# Patient Record
Sex: Female | Born: 1935
Health system: Southern US, Community
[De-identification: ages and names within clinical notes are randomized; demographics above are authoritative.]

## PROBLEM LIST (undated history)

## (undated) ENCOUNTER — Emergency Department (HOSPITAL_COMMUNITY)

## (undated) DIAGNOSIS — N179 Acute kidney failure, unspecified: Secondary | ICD-10-CM

## (undated) DIAGNOSIS — M879 Osteonecrosis, unspecified: Secondary | ICD-10-CM

## (undated) DIAGNOSIS — I739 Peripheral vascular disease, unspecified: Secondary | ICD-10-CM

## (undated) DIAGNOSIS — Z973 Presence of spectacles and contact lenses: Secondary | ICD-10-CM

## (undated) DIAGNOSIS — I712 Thoracic aortic aneurysm, without rupture: Secondary | ICD-10-CM

## (undated) DIAGNOSIS — R519 Headache, unspecified: Secondary | ICD-10-CM

## (undated) DIAGNOSIS — S72009A Fracture of unspecified part of neck of unspecified femur, initial encounter for closed fracture: Secondary | ICD-10-CM

## (undated) DIAGNOSIS — D72829 Elevated white blood cell count, unspecified: Secondary | ICD-10-CM

## (undated) DIAGNOSIS — F32A Depression, unspecified: Secondary | ICD-10-CM

## (undated) DIAGNOSIS — I350 Nonrheumatic aortic (valve) stenosis: Secondary | ICD-10-CM

## (undated) DIAGNOSIS — L97909 Non-pressure chronic ulcer of unspecified part of unspecified lower leg with unspecified severity: Secondary | ICD-10-CM

## (undated) DIAGNOSIS — I7121 Aneurysm of the ascending aorta, without rupture: Secondary | ICD-10-CM

## (undated) DIAGNOSIS — I10 Essential (primary) hypertension: Secondary | ICD-10-CM

## (undated) DIAGNOSIS — F329 Major depressive disorder, single episode, unspecified: Secondary | ICD-10-CM

## (undated) DIAGNOSIS — M069 Rheumatoid arthritis, unspecified: Secondary | ICD-10-CM

## (undated) DIAGNOSIS — S42309A Unspecified fracture of shaft of humerus, unspecified arm, initial encounter for closed fracture: Secondary | ICD-10-CM

## (undated) DIAGNOSIS — D649 Anemia, unspecified: Secondary | ICD-10-CM

## (undated) DIAGNOSIS — M810 Age-related osteoporosis without current pathological fracture: Secondary | ICD-10-CM

## (undated) DIAGNOSIS — H269 Unspecified cataract: Secondary | ICD-10-CM

## (undated) DIAGNOSIS — T148XXA Other injury of unspecified body region, initial encounter: Secondary | ICD-10-CM

## (undated) HISTORY — DX: Elevated white blood cell count, unspecified: D72.829

## (undated) HISTORY — DX: Acute kidney failure, unspecified: N17.9

## (undated) HISTORY — DX: Non-pressure chronic ulcer of unspecified part of unspecified lower leg with unspecified severity: L97.909

## (undated) HISTORY — DX: Nonrheumatic aortic (valve) stenosis: I35.0

## (undated) HISTORY — DX: Fracture of unspecified part of neck of unspecified femur, initial encounter for closed fracture: S72.009A

## (undated) HISTORY — DX: Unspecified fracture of shaft of humerus, unspecified arm, initial encounter for closed fracture: S42.309A

## (undated) HISTORY — DX: Anemia, unspecified: D64.9

---
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
## 2010-01-23 ENCOUNTER — Ambulatory Visit (HOSPITAL_COMMUNITY): Admission: RE | Admit: 2010-01-23 | Discharge: 2010-01-23 | Payer: Self-pay | Admitting: Internal Medicine

## 2010-04-08 ENCOUNTER — Ambulatory Visit (HOSPITAL_COMMUNITY): Admission: RE | Admit: 2010-04-08 | Discharge: 2010-04-08 | Payer: Self-pay | Admitting: Internal Medicine

## 2010-04-14 ENCOUNTER — Ambulatory Visit (HOSPITAL_COMMUNITY): Admission: RE | Admit: 2010-04-14 | Discharge: 2010-04-14 | Payer: Self-pay | Admitting: Internal Medicine

## 2010-05-18 ENCOUNTER — Ambulatory Visit (HOSPITAL_COMMUNITY): Payer: Self-pay | Admitting: Internal Medicine

## 2010-05-18 ENCOUNTER — Encounter (HOSPITAL_COMMUNITY)
Admission: RE | Admit: 2010-05-18 | Discharge: 2010-06-17 | Payer: Self-pay | Source: Home / Self Care | Attending: Internal Medicine | Admitting: Internal Medicine

## 2010-07-12 ENCOUNTER — Encounter: Payer: Self-pay | Admitting: Internal Medicine

## 2010-08-06 ENCOUNTER — Ambulatory Visit (HOSPITAL_COMMUNITY)
Admission: RE | Admit: 2010-08-06 | Discharge: 2010-08-06 | Disposition: A | Discharge status: Home / Self Care | Payer: Medicare Other | Source: Ambulatory Visit | Attending: Rheumatology | Admitting: Rheumatology

## 2010-08-06 ENCOUNTER — Other Ambulatory Visit (HOSPITAL_COMMUNITY): Payer: Self-pay | Admitting: Rheumatology

## 2010-08-06 DIAGNOSIS — R1030 Lower abdominal pain, unspecified: Secondary | ICD-10-CM

## 2010-08-06 DIAGNOSIS — M25559 Pain in unspecified hip: Secondary | ICD-10-CM

## 2010-08-06 DIAGNOSIS — M25569 Pain in unspecified knee: Secondary | ICD-10-CM | POA: Insufficient documentation

## 2010-11-09 ENCOUNTER — Encounter (HOSPITAL_COMMUNITY): Payer: Medicare Other | Attending: Internal Medicine

## 2010-11-09 ENCOUNTER — Encounter (HOSPITAL_COMMUNITY): Payer: Medicare Other

## 2011-06-29 DIAGNOSIS — Z79899 Other long term (current) drug therapy: Secondary | ICD-10-CM | POA: Diagnosis not present

## 2011-06-29 DIAGNOSIS — M069 Rheumatoid arthritis, unspecified: Secondary | ICD-10-CM | POA: Diagnosis not present

## 2011-07-29 DIAGNOSIS — Z79899 Other long term (current) drug therapy: Secondary | ICD-10-CM | POA: Diagnosis not present

## 2011-07-29 DIAGNOSIS — R05 Cough: Secondary | ICD-10-CM | POA: Diagnosis not present

## 2011-07-29 DIAGNOSIS — M069 Rheumatoid arthritis, unspecified: Secondary | ICD-10-CM | POA: Diagnosis not present

## 2011-07-29 DIAGNOSIS — R700 Elevated erythrocyte sedimentation rate: Secondary | ICD-10-CM | POA: Diagnosis not present

## 2011-08-30 DIAGNOSIS — L259 Unspecified contact dermatitis, unspecified cause: Secondary | ICD-10-CM | POA: Diagnosis not present

## 2011-09-07 DIAGNOSIS — R5381 Other malaise: Secondary | ICD-10-CM | POA: Diagnosis not present

## 2011-09-13 DIAGNOSIS — R05 Cough: Secondary | ICD-10-CM | POA: Diagnosis not present

## 2011-09-13 DIAGNOSIS — Z79899 Other long term (current) drug therapy: Secondary | ICD-10-CM | POA: Diagnosis not present

## 2011-09-13 DIAGNOSIS — M069 Rheumatoid arthritis, unspecified: Secondary | ICD-10-CM | POA: Diagnosis not present

## 2011-09-28 DIAGNOSIS — Z79899 Other long term (current) drug therapy: Secondary | ICD-10-CM | POA: Diagnosis not present

## 2011-09-28 DIAGNOSIS — R05 Cough: Secondary | ICD-10-CM | POA: Diagnosis not present

## 2011-09-28 DIAGNOSIS — M069 Rheumatoid arthritis, unspecified: Secondary | ICD-10-CM | POA: Diagnosis not present

## 2011-09-28 DIAGNOSIS — G4700 Insomnia, unspecified: Secondary | ICD-10-CM | POA: Diagnosis not present

## 2011-10-27 DIAGNOSIS — M25549 Pain in joints of unspecified hand: Secondary | ICD-10-CM | POA: Diagnosis not present

## 2011-10-27 DIAGNOSIS — M069 Rheumatoid arthritis, unspecified: Secondary | ICD-10-CM | POA: Diagnosis not present

## 2011-10-27 DIAGNOSIS — M545 Low back pain: Secondary | ICD-10-CM | POA: Diagnosis not present

## 2011-10-27 DIAGNOSIS — Z79899 Other long term (current) drug therapy: Secondary | ICD-10-CM | POA: Diagnosis not present

## 2011-11-09 DIAGNOSIS — Z79899 Other long term (current) drug therapy: Secondary | ICD-10-CM | POA: Diagnosis not present

## 2011-11-09 DIAGNOSIS — M069 Rheumatoid arthritis, unspecified: Secondary | ICD-10-CM | POA: Diagnosis not present

## 2011-12-14 DIAGNOSIS — R5381 Other malaise: Secondary | ICD-10-CM | POA: Diagnosis not present

## 2011-12-14 DIAGNOSIS — H1010 Acute atopic conjunctivitis, unspecified eye: Secondary | ICD-10-CM | POA: Diagnosis not present

## 2011-12-14 DIAGNOSIS — R51 Headache: Secondary | ICD-10-CM | POA: Diagnosis not present

## 2011-12-14 DIAGNOSIS — M316 Other giant cell arteritis: Secondary | ICD-10-CM | POA: Diagnosis not present

## 2011-12-17 DIAGNOSIS — R112 Nausea with vomiting, unspecified: Secondary | ICD-10-CM | POA: Diagnosis not present

## 2011-12-17 DIAGNOSIS — B029 Zoster without complications: Secondary | ICD-10-CM | POA: Diagnosis not present

## 2011-12-24 DIAGNOSIS — H538 Other visual disturbances: Secondary | ICD-10-CM | POA: Diagnosis not present

## 2011-12-24 DIAGNOSIS — B029 Zoster without complications: Secondary | ICD-10-CM | POA: Diagnosis not present

## 2011-12-24 DIAGNOSIS — R112 Nausea with vomiting, unspecified: Secondary | ICD-10-CM | POA: Diagnosis not present

## 2011-12-24 DIAGNOSIS — B0230 Zoster ocular disease, unspecified: Secondary | ICD-10-CM | POA: Diagnosis not present

## 2011-12-27 DIAGNOSIS — B0230 Zoster ocular disease, unspecified: Secondary | ICD-10-CM | POA: Diagnosis not present

## 2012-01-03 DIAGNOSIS — B0230 Zoster ocular disease, unspecified: Secondary | ICD-10-CM | POA: Diagnosis not present

## 2012-01-14 DIAGNOSIS — B029 Zoster without complications: Secondary | ICD-10-CM | POA: Diagnosis not present

## 2012-01-14 DIAGNOSIS — IMO0002 Reserved for concepts with insufficient information to code with codable children: Secondary | ICD-10-CM | POA: Diagnosis not present

## 2012-01-14 DIAGNOSIS — H538 Other visual disturbances: Secondary | ICD-10-CM | POA: Diagnosis not present

## 2012-01-25 DIAGNOSIS — B0230 Zoster ocular disease, unspecified: Secondary | ICD-10-CM | POA: Diagnosis not present

## 2012-02-01 DIAGNOSIS — B0230 Zoster ocular disease, unspecified: Secondary | ICD-10-CM | POA: Diagnosis not present

## 2012-02-14 DIAGNOSIS — IMO0002 Reserved for concepts with insufficient information to code with codable children: Secondary | ICD-10-CM | POA: Diagnosis not present

## 2012-02-14 DIAGNOSIS — M1990 Unspecified osteoarthritis, unspecified site: Secondary | ICD-10-CM | POA: Diagnosis not present

## 2012-02-15 ENCOUNTER — Other Ambulatory Visit (HOSPITAL_COMMUNITY): Payer: Self-pay | Admitting: Internal Medicine

## 2012-02-15 DIAGNOSIS — Z139 Encounter for screening, unspecified: Secondary | ICD-10-CM

## 2012-02-22 ENCOUNTER — Ambulatory Visit (HOSPITAL_COMMUNITY)
Admission: RE | Admit: 2012-02-22 | Discharge: 2012-02-22 | Disposition: A | Discharge status: Home / Self Care | Payer: Medicare Other | Source: Ambulatory Visit | Attending: Internal Medicine | Admitting: Internal Medicine

## 2012-02-22 DIAGNOSIS — Z1231 Encounter for screening mammogram for malignant neoplasm of breast: Secondary | ICD-10-CM | POA: Insufficient documentation

## 2012-02-22 DIAGNOSIS — Z139 Encounter for screening, unspecified: Secondary | ICD-10-CM

## 2012-02-24 DIAGNOSIS — M069 Rheumatoid arthritis, unspecified: Secondary | ICD-10-CM | POA: Diagnosis not present

## 2012-02-24 DIAGNOSIS — Z79899 Other long term (current) drug therapy: Secondary | ICD-10-CM | POA: Diagnosis not present

## 2012-03-20 DIAGNOSIS — H524 Presbyopia: Secondary | ICD-10-CM | POA: Diagnosis not present

## 2012-03-20 DIAGNOSIS — H40019 Open angle with borderline findings, low risk, unspecified eye: Secondary | ICD-10-CM | POA: Diagnosis not present

## 2012-03-20 DIAGNOSIS — H52229 Regular astigmatism, unspecified eye: Secondary | ICD-10-CM | POA: Diagnosis not present

## 2012-03-20 DIAGNOSIS — H5200 Hypermetropia, unspecified eye: Secondary | ICD-10-CM | POA: Diagnosis not present

## 2012-04-04 DIAGNOSIS — H4040X0 Glaucoma secondary to eye inflammation, unspecified eye, stage unspecified: Secondary | ICD-10-CM | POA: Diagnosis not present

## 2012-04-04 DIAGNOSIS — H409 Unspecified glaucoma: Secondary | ICD-10-CM | POA: Diagnosis not present

## 2012-04-04 DIAGNOSIS — B0230 Zoster ocular disease, unspecified: Secondary | ICD-10-CM | POA: Diagnosis not present

## 2012-04-25 DIAGNOSIS — H4040X0 Glaucoma secondary to eye inflammation, unspecified eye, stage unspecified: Secondary | ICD-10-CM | POA: Diagnosis not present

## 2012-04-25 DIAGNOSIS — B0230 Zoster ocular disease, unspecified: Secondary | ICD-10-CM | POA: Diagnosis not present

## 2012-04-25 DIAGNOSIS — H409 Unspecified glaucoma: Secondary | ICD-10-CM | POA: Diagnosis not present

## 2012-05-09 DIAGNOSIS — H4040X0 Glaucoma secondary to eye inflammation, unspecified eye, stage unspecified: Secondary | ICD-10-CM | POA: Diagnosis not present

## 2012-05-09 DIAGNOSIS — B0230 Zoster ocular disease, unspecified: Secondary | ICD-10-CM | POA: Diagnosis not present

## 2012-07-11 DIAGNOSIS — B0230 Zoster ocular disease, unspecified: Secondary | ICD-10-CM | POA: Diagnosis not present

## 2012-08-14 DIAGNOSIS — Z79899 Other long term (current) drug therapy: Secondary | ICD-10-CM | POA: Diagnosis not present

## 2012-08-14 DIAGNOSIS — M2550 Pain in unspecified joint: Secondary | ICD-10-CM | POA: Diagnosis not present

## 2012-08-14 DIAGNOSIS — E785 Hyperlipidemia, unspecified: Secondary | ICD-10-CM | POA: Diagnosis not present

## 2012-08-14 DIAGNOSIS — I10 Essential (primary) hypertension: Secondary | ICD-10-CM | POA: Diagnosis not present

## 2012-11-08 DIAGNOSIS — M2550 Pain in unspecified joint: Secondary | ICD-10-CM | POA: Diagnosis not present

## 2012-11-08 DIAGNOSIS — T7840XA Allergy, unspecified, initial encounter: Secondary | ICD-10-CM | POA: Diagnosis not present

## 2012-11-28 DIAGNOSIS — L259 Unspecified contact dermatitis, unspecified cause: Secondary | ICD-10-CM | POA: Diagnosis not present

## 2012-12-05 DIAGNOSIS — L259 Unspecified contact dermatitis, unspecified cause: Secondary | ICD-10-CM | POA: Diagnosis not present

## 2012-12-19 DIAGNOSIS — L259 Unspecified contact dermatitis, unspecified cause: Secondary | ICD-10-CM | POA: Diagnosis not present

## 2013-03-06 DIAGNOSIS — L259 Unspecified contact dermatitis, unspecified cause: Secondary | ICD-10-CM | POA: Diagnosis not present

## 2013-03-13 DIAGNOSIS — H524 Presbyopia: Secondary | ICD-10-CM | POA: Diagnosis not present

## 2013-03-13 DIAGNOSIS — H40059 Ocular hypertension, unspecified eye: Secondary | ICD-10-CM | POA: Diagnosis not present

## 2013-03-13 DIAGNOSIS — H52229 Regular astigmatism, unspecified eye: Secondary | ICD-10-CM | POA: Diagnosis not present

## 2013-03-13 DIAGNOSIS — H5200 Hypermetropia, unspecified eye: Secondary | ICD-10-CM | POA: Diagnosis not present

## 2013-04-30 DIAGNOSIS — F3289 Other specified depressive episodes: Secondary | ICD-10-CM | POA: Diagnosis not present

## 2013-04-30 DIAGNOSIS — F329 Major depressive disorder, single episode, unspecified: Secondary | ICD-10-CM | POA: Diagnosis not present

## 2013-04-30 DIAGNOSIS — M069 Rheumatoid arthritis, unspecified: Secondary | ICD-10-CM | POA: Diagnosis not present

## 2013-05-22 DIAGNOSIS — M25559 Pain in unspecified hip: Secondary | ICD-10-CM | POA: Diagnosis not present

## 2013-05-22 DIAGNOSIS — F329 Major depressive disorder, single episode, unspecified: Secondary | ICD-10-CM | POA: Diagnosis not present

## 2013-06-26 DIAGNOSIS — L2089 Other atopic dermatitis: Secondary | ICD-10-CM | POA: Diagnosis not present

## 2013-06-26 DIAGNOSIS — M25559 Pain in unspecified hip: Secondary | ICD-10-CM | POA: Diagnosis not present

## 2014-04-23 DIAGNOSIS — R05 Cough: Secondary | ICD-10-CM | POA: Diagnosis not present

## 2014-07-10 DIAGNOSIS — L239 Allergic contact dermatitis, unspecified cause: Secondary | ICD-10-CM | POA: Diagnosis not present

## 2014-08-01 DIAGNOSIS — M25569 Pain in unspecified knee: Secondary | ICD-10-CM | POA: Diagnosis not present

## 2014-08-01 DIAGNOSIS — L239 Allergic contact dermatitis, unspecified cause: Secondary | ICD-10-CM | POA: Diagnosis not present

## 2015-03-14 ENCOUNTER — Encounter (HOSPITAL_COMMUNITY)
Admission: EM | Disposition: A | Discharge status: Skilled Nursing Facility | Payer: Self-pay | Source: Home / Self Care | Attending: Internal Medicine

## 2015-03-14 ENCOUNTER — Inpatient Hospital Stay (HOSPITAL_COMMUNITY): Payer: Medicare Other

## 2015-03-14 ENCOUNTER — Emergency Department (HOSPITAL_COMMUNITY): Payer: Medicare Other

## 2015-03-14 ENCOUNTER — Inpatient Hospital Stay (HOSPITAL_COMMUNITY): Payer: Medicare Other | Admitting: Certified Registered"

## 2015-03-14 ENCOUNTER — Encounter (HOSPITAL_COMMUNITY): Payer: Self-pay

## 2015-03-14 ENCOUNTER — Inpatient Hospital Stay (HOSPITAL_COMMUNITY)
Admission: EM | Admit: 2015-03-14 | Discharge: 2015-03-17 | DRG: 481 | Disposition: A | Discharge status: Skilled Nursing Facility | Payer: Medicare Other | Attending: Internal Medicine | Admitting: Internal Medicine

## 2015-03-14 DIAGNOSIS — W19XXXS Unspecified fall, sequela: Secondary | ICD-10-CM | POA: Diagnosis not present

## 2015-03-14 DIAGNOSIS — S72001A Fracture of unspecified part of neck of right femur, initial encounter for closed fracture: Secondary | ICD-10-CM | POA: Diagnosis not present

## 2015-03-14 DIAGNOSIS — S72131A Displaced apophyseal fracture of right femur, initial encounter for closed fracture: Secondary | ICD-10-CM | POA: Diagnosis not present

## 2015-03-14 DIAGNOSIS — W19XXXA Unspecified fall, initial encounter: Secondary | ICD-10-CM | POA: Diagnosis present

## 2015-03-14 DIAGNOSIS — D62 Acute posthemorrhagic anemia: Secondary | ICD-10-CM | POA: Diagnosis not present

## 2015-03-14 DIAGNOSIS — Z9181 History of falling: Secondary | ICD-10-CM | POA: Diagnosis not present

## 2015-03-14 DIAGNOSIS — M6281 Muscle weakness (generalized): Secondary | ICD-10-CM | POA: Diagnosis not present

## 2015-03-14 DIAGNOSIS — M810 Age-related osteoporosis without current pathological fracture: Secondary | ICD-10-CM | POA: Diagnosis present

## 2015-03-14 DIAGNOSIS — N179 Acute kidney failure, unspecified: Secondary | ICD-10-CM | POA: Diagnosis not present

## 2015-03-14 DIAGNOSIS — S72141D Displaced intertrochanteric fracture of right femur, subsequent encounter for closed fracture with routine healing: Secondary | ICD-10-CM | POA: Diagnosis not present

## 2015-03-14 DIAGNOSIS — M25551 Pain in right hip: Secondary | ICD-10-CM | POA: Diagnosis not present

## 2015-03-14 DIAGNOSIS — S72141A Displaced intertrochanteric fracture of right femur, initial encounter for closed fracture: Secondary | ICD-10-CM | POA: Diagnosis present

## 2015-03-14 DIAGNOSIS — M069 Rheumatoid arthritis, unspecified: Secondary | ICD-10-CM | POA: Diagnosis not present

## 2015-03-14 DIAGNOSIS — M21251 Flexion deformity, right hip: Secondary | ICD-10-CM | POA: Diagnosis not present

## 2015-03-14 DIAGNOSIS — T148XXA Other injury of unspecified body region, initial encounter: Secondary | ICD-10-CM

## 2015-03-14 DIAGNOSIS — R110 Nausea: Secondary | ICD-10-CM | POA: Diagnosis not present

## 2015-03-14 DIAGNOSIS — R262 Difficulty in walking, not elsewhere classified: Secondary | ICD-10-CM | POA: Diagnosis not present

## 2015-03-14 DIAGNOSIS — S72001S Fracture of unspecified part of neck of right femur, sequela: Secondary | ICD-10-CM | POA: Diagnosis not present

## 2015-03-14 DIAGNOSIS — R030 Elevated blood-pressure reading, without diagnosis of hypertension: Secondary | ICD-10-CM | POA: Diagnosis not present

## 2015-03-14 DIAGNOSIS — Z9981 Dependence on supplemental oxygen: Secondary | ICD-10-CM | POA: Diagnosis not present

## 2015-03-14 DIAGNOSIS — R278 Other lack of coordination: Secondary | ICD-10-CM | POA: Diagnosis not present

## 2015-03-14 DIAGNOSIS — Z4789 Encounter for other orthopedic aftercare: Secondary | ICD-10-CM | POA: Diagnosis not present

## 2015-03-14 HISTORY — DX: Age-related osteoporosis without current pathological fracture: M81.0

## 2015-03-14 HISTORY — DX: Rheumatoid arthritis, unspecified: M06.9

## 2015-03-14 HISTORY — DX: Fracture of unspecified part of neck of right femur, initial encounter for closed fracture: S72.001A

## 2015-03-14 HISTORY — PX: INTRAMEDULLARY (IM) NAIL INTERTROCHANTERIC: SHX5875

## 2015-03-14 LAB — COMPREHENSIVE METABOLIC PANEL
ALBUMIN: 4.4 g/dL (ref 3.5–5.0)
ALK PHOS: 70 U/L (ref 38–126)
ALT: 13 U/L — AB (ref 14–54)
AST: 27 U/L (ref 15–41)
Anion gap: 12 (ref 5–15)
BILIRUBIN TOTAL: 0.6 mg/dL (ref 0.3–1.2)
BUN: 28 mg/dL — AB (ref 6–20)
CO2: 22 mmol/L (ref 22–32)
Calcium: 9.6 mg/dL (ref 8.9–10.3)
Chloride: 105 mmol/L (ref 101–111)
Creatinine, Ser: 1.08 mg/dL — ABNORMAL HIGH (ref 0.44–1.00)
GFR calc Af Amer: 54 mL/min — ABNORMAL LOW (ref >60)
GFR calc non Af Amer: 47 mL/min — ABNORMAL LOW (ref >60)
GLUCOSE: 103 mg/dL — AB (ref 65–99)
POTASSIUM: 3.7 mmol/L (ref 3.5–5.1)
Sodium: 139 mmol/L (ref 135–145)
TOTAL PROTEIN: 7.1 g/dL (ref 6.5–8.1)

## 2015-03-14 LAB — CBC
HEMATOCRIT: 37 % (ref 36.0–46.0)
Hemoglobin: 12.1 g/dL (ref 12.0–15.0)
MCH: 30 pg (ref 26.0–34.0)
MCHC: 32.7 g/dL (ref 30.0–36.0)
MCV: 91.6 fL (ref 78.0–100.0)
Platelets: 235 10*3/uL (ref 150–400)
RBC: 4.04 MIL/uL (ref 3.87–5.11)
RDW: 13.5 % (ref 11.5–15.5)
WBC: 11.8 10*3/uL — AB (ref 4.0–10.5)

## 2015-03-14 LAB — TYPE AND SCREEN
ABO/RH(D): O POS
ANTIBODY SCREEN: NEGATIVE

## 2015-03-14 LAB — URINALYSIS, ROUTINE W REFLEX MICROSCOPIC
Bilirubin Urine: NEGATIVE
Glucose, UA: NEGATIVE mg/dL
HGB URINE DIPSTICK: NEGATIVE
Ketones, ur: 15 mg/dL — AB
Leukocytes, UA: NEGATIVE
Nitrite: NEGATIVE
Protein, ur: NEGATIVE mg/dL
SPECIFIC GRAVITY, URINE: 1.01 (ref 1.005–1.030)
UROBILINOGEN UA: 0.2 mg/dL (ref 0.0–1.0)
pH: 7 (ref 5.0–8.0)

## 2015-03-14 LAB — CBC WITH DIFFERENTIAL/PLATELET
BASOS ABS: 0 10*3/uL (ref 0.0–0.1)
BASOS PCT: 0 %
Eosinophils Absolute: 0.1 10*3/uL (ref 0.0–0.7)
Eosinophils Relative: 1 %
HEMATOCRIT: 42.6 % (ref 36.0–46.0)
HEMOGLOBIN: 14.1 g/dL (ref 12.0–15.0)
Lymphocytes Relative: 19 %
Lymphs Abs: 1.9 10*3/uL (ref 0.7–4.0)
MCH: 30.6 pg (ref 26.0–34.0)
MCHC: 33.1 g/dL (ref 30.0–36.0)
MCV: 92.4 fL (ref 78.0–100.0)
Monocytes Absolute: 0.7 10*3/uL (ref 0.1–1.0)
Monocytes Relative: 7 %
NEUTROS ABS: 7.1 10*3/uL (ref 1.7–7.7)
NEUTROS PCT: 73 %
Platelets: 268 10*3/uL (ref 150–400)
RBC: 4.61 MIL/uL (ref 3.87–5.11)
RDW: 13.5 % (ref 11.5–15.5)
WBC: 9.7 10*3/uL (ref 4.0–10.5)

## 2015-03-14 LAB — CREATININE, SERUM
Creatinine, Ser: 1.07 mg/dL — ABNORMAL HIGH (ref 0.44–1.00)
GFR calc non Af Amer: 47 mL/min — ABNORMAL LOW (ref >60)
GFR, EST AFRICAN AMERICAN: 55 mL/min — AB (ref >60)

## 2015-03-14 LAB — SURGICAL PCR SCREEN
MRSA, PCR: NEGATIVE
STAPHYLOCOCCUS AUREUS: NEGATIVE

## 2015-03-14 LAB — PROTIME-INR
INR: 0.99 (ref 0.00–1.49)
Prothrombin Time: 13.3 seconds (ref 11.6–15.2)

## 2015-03-14 LAB — ABO/RH: ABO/RH(D): O POS

## 2015-03-14 SURGERY — FIXATION, FRACTURE, INTERTROCHANTERIC, WITH INTRAMEDULLARY ROD
Anesthesia: General | Site: Hip | Laterality: Right

## 2015-03-14 MED ORDER — OXYCODONE HCL 5 MG/5ML PO SOLN: 5.0000 mg | Freq: Once | ORAL | Status: DC | PRN

## 2015-03-14 MED ORDER — CEFAZOLIN SODIUM 1-5 GM-% IV SOLN
1.0000 g | Freq: Three times a day (TID) | INTRAVENOUS | Status: AC
  Administered 2015-03-15 (×2): 1 g via INTRAVENOUS
  Filled 2015-03-14 (×2): qty 50

## 2015-03-14 MED ORDER — SUCCINYLCHOLINE CHLORIDE 20 MG/ML IJ SOLN
INTRAMUSCULAR | Status: DC | PRN
  Administered 2015-03-14: 80 mg via INTRAVENOUS

## 2015-03-14 MED ORDER — HYDROCODONE-ACETAMINOPHEN 5-325 MG PO TABS: 1.0000 | ORAL_TABLET | Freq: Four times a day (QID) | ORAL | Status: DC | PRN

## 2015-03-14 MED ORDER — ONDANSETRON HCL 4 MG/2ML IJ SOLN: 4.0000 mg | Freq: Four times a day (QID) | INTRAMUSCULAR | Status: DC | PRN

## 2015-03-14 MED ORDER — POLYETHYLENE GLYCOL 3350 17 G PO PACK
17.0000 g | PACK | Freq: Every day | ORAL | Status: DC | PRN
  Administered 2015-03-16: 17 g via ORAL
  Filled 2015-03-14: qty 1

## 2015-03-14 MED ORDER — HEPARIN SODIUM (PORCINE) 5000 UNIT/ML IJ SOLN
5000.0000 [IU] | Freq: Three times a day (TID) | INTRAMUSCULAR | Status: DC
  Administered 2015-03-14 – 2015-03-17 (×9): 5000 [IU] via SUBCUTANEOUS
  Filled 2015-03-14 (×9): qty 1

## 2015-03-14 MED ORDER — SUCCINYLCHOLINE CHLORIDE 20 MG/ML IJ SOLN
INTRAMUSCULAR | Status: AC
Start: 2015-03-14 — End: 2015-03-14
  Filled 2015-03-14: qty 1

## 2015-03-14 MED ORDER — METHOCARBAMOL 1000 MG/10ML IJ SOLN: 500.0000 mg | Freq: Four times a day (QID) | INTRAVENOUS | Status: DC | PRN

## 2015-03-14 MED ORDER — LACTATED RINGERS IV SOLN
INTRAVENOUS | Status: DC | PRN
  Administered 2015-03-14: 22:00:00 via INTRAVENOUS

## 2015-03-14 MED ORDER — SODIUM CHLORIDE 0.9 % IV BOLUS (SEPSIS)
1000.0000 mL | Freq: Once | INTRAVENOUS | Status: AC
  Administered 2015-03-14: 1000 mL via INTRAVENOUS

## 2015-03-14 MED ORDER — MENTHOL 3 MG MT LOZG: 1.0000 | LOZENGE | OROMUCOSAL | Status: DC | PRN

## 2015-03-14 MED ORDER — SODIUM CHLORIDE 0.9 % IV SOLN
INTRAVENOUS | Status: DC
  Administered 2015-03-14: 23:00:00 via INTRAVENOUS

## 2015-03-14 MED ORDER — ACETAMINOPHEN 325 MG PO TABS: 650.0000 mg | ORAL_TABLET | Freq: Four times a day (QID) | ORAL | Status: DC | PRN

## 2015-03-14 MED ORDER — MORPHINE SULFATE (PF) 2 MG/ML IV SOLN
0.5000 mg | INTRAVENOUS | Status: DC | PRN
  Administered 2015-03-14 – 2015-03-15 (×3): 0.5 mg via INTRAVENOUS
  Filled 2015-03-14 (×3): qty 1

## 2015-03-14 MED ORDER — CEFAZOLIN SODIUM-DEXTROSE 2-3 GM-% IV SOLR
INTRAVENOUS | Status: AC
Start: 2015-03-14 — End: 2015-03-14
  Filled 2015-03-14: qty 50

## 2015-03-14 MED ORDER — 0.9 % SODIUM CHLORIDE (POUR BTL) OPTIME
TOPICAL | Status: DC | PRN
  Administered 2015-03-14: 1000 mL

## 2015-03-14 MED ORDER — DEXAMETHASONE SODIUM PHOSPHATE 4 MG/ML IJ SOLN
INTRAMUSCULAR | Status: DC | PRN
  Administered 2015-03-14: 4 mg via INTRAVENOUS

## 2015-03-14 MED ORDER — FENTANYL CITRATE (PF) 100 MCG/2ML IJ SOLN: 25.0000 ug | INTRAMUSCULAR | Status: DC | PRN

## 2015-03-14 MED ORDER — METOCLOPRAMIDE HCL 5 MG/ML IJ SOLN
5.0000 mg | Freq: Three times a day (TID) | INTRAMUSCULAR | Status: DC | PRN
  Administered 2015-03-15 – 2015-03-17 (×3): 10 mg via INTRAVENOUS
  Filled 2015-03-14 (×3): qty 2

## 2015-03-14 MED ORDER — EPHEDRINE SULFATE 50 MG/ML IJ SOLN
INTRAMUSCULAR | Status: DC | PRN
  Administered 2015-03-14 (×3): 10 mg via INTRAVENOUS

## 2015-03-14 MED ORDER — ASPIRIN EC 325 MG PO TBEC
325.0000 mg | DELAYED_RELEASE_TABLET | Freq: Every day | ORAL | Status: DC
  Administered 2015-03-15 – 2015-03-17 (×3): 325 mg via ORAL
  Filled 2015-03-14 (×3): qty 1

## 2015-03-14 MED ORDER — SERTRALINE HCL 50 MG PO TABS
50.0000 mg | ORAL_TABLET | Freq: Every day | ORAL | Status: DC
  Administered 2015-03-15 – 2015-03-17 (×3): 50 mg via ORAL
  Filled 2015-03-14 (×3): qty 1

## 2015-03-14 MED ORDER — EPHEDRINE SULFATE 50 MG/ML IJ SOLN
INTRAMUSCULAR | Status: AC
  Filled 2015-03-14: qty 1

## 2015-03-14 MED ORDER — OXYCODONE HCL 5 MG PO TABS: 5.0000 mg | ORAL_TABLET | Freq: Once | ORAL | Status: DC | PRN

## 2015-03-14 MED ORDER — SODIUM CHLORIDE 0.9 % IJ SOLN
INTRAMUSCULAR | Status: AC
  Filled 2015-03-14: qty 10

## 2015-03-14 MED ORDER — MORPHINE SULFATE (PF) 4 MG/ML IV SOLN
4.0000 mg | Freq: Once | INTRAVENOUS | Status: AC
  Administered 2015-03-14: 2 mg via INTRAVENOUS
  Filled 2015-03-14: qty 1

## 2015-03-14 MED ORDER — ONDANSETRON HCL 4 MG PO TABS: 4.0000 mg | ORAL_TABLET | Freq: Four times a day (QID) | ORAL | Status: DC | PRN

## 2015-03-14 MED ORDER — ONDANSETRON HCL 4 MG/2ML IJ SOLN: 4.0000 mg | Freq: Once | INTRAMUSCULAR | Status: DC | PRN

## 2015-03-14 MED ORDER — HYDROCODONE-ACETAMINOPHEN 5-325 MG PO TABS
1.0000 | ORAL_TABLET | Freq: Four times a day (QID) | ORAL | Status: DC | PRN
  Administered 2015-03-15 (×2): 2 via ORAL
  Filled 2015-03-14 (×2): qty 2

## 2015-03-14 MED ORDER — ONDANSETRON HCL 4 MG/2ML IJ SOLN
4.0000 mg | Freq: Once | INTRAMUSCULAR | Status: AC
  Administered 2015-03-14: 4 mg via INTRAVENOUS
  Filled 2015-03-14: qty 2

## 2015-03-14 MED ORDER — ONDANSETRON HCL 4 MG/2ML IJ SOLN
INTRAMUSCULAR | Status: AC
  Filled 2015-03-14: qty 2

## 2015-03-14 MED ORDER — METHOCARBAMOL 500 MG PO TABS
500.0000 mg | ORAL_TABLET | Freq: Four times a day (QID) | ORAL | Status: DC | PRN
  Administered 2015-03-15 – 2015-03-17 (×2): 500 mg via ORAL
  Filled 2015-03-14 (×2): qty 1

## 2015-03-14 MED ORDER — OXYCODONE HCL 5 MG PO TABS: 5.0000 mg | ORAL_TABLET | ORAL | Status: DC | PRN

## 2015-03-14 MED ORDER — DIAZEPAM 5 MG/ML IJ SOLN
2.5000 mg | Freq: Once | INTRAMUSCULAR | Status: AC
Start: 2015-03-14 — End: 2015-03-14
  Administered 2015-03-14: 2.5 mg via INTRAVENOUS
  Filled 2015-03-14: qty 2

## 2015-03-14 MED ORDER — METHOCARBAMOL 1000 MG/10ML IJ SOLN
500.0000 mg | Freq: Four times a day (QID) | INTRAVENOUS | Status: DC | PRN
  Filled 2015-03-14: qty 5

## 2015-03-14 MED ORDER — PROPOFOL 10 MG/ML IV BOLUS
INTRAVENOUS | Status: DC | PRN
  Administered 2015-03-14: 100 mg via INTRAVENOUS

## 2015-03-14 MED ORDER — SODIUM CHLORIDE 0.9 % IV SOLN
INTRAVENOUS | Status: DC | PRN
  Administered 2015-03-14: 21:00:00 via INTRAVENOUS

## 2015-03-14 MED ORDER — ACETAMINOPHEN 650 MG RE SUPP: 650.0000 mg | Freq: Four times a day (QID) | RECTAL | Status: DC | PRN

## 2015-03-14 MED ORDER — METHOCARBAMOL 500 MG PO TABS: 500.0000 mg | ORAL_TABLET | Freq: Four times a day (QID) | ORAL | Status: DC | PRN

## 2015-03-14 MED ORDER — METOCLOPRAMIDE HCL 5 MG PO TABS: 5.0000 mg | ORAL_TABLET | Freq: Three times a day (TID) | ORAL | Status: DC | PRN

## 2015-03-14 MED ORDER — SODIUM CHLORIDE 0.9 % IV SOLN
INTRAVENOUS | Status: DC
  Administered 2015-03-14: 20:00:00 via INTRAVENOUS

## 2015-03-14 MED ORDER — DIAZEPAM 5 MG/ML IJ SOLN
1.0000 mg | Freq: Once | INTRAMUSCULAR | Status: AC
  Administered 2015-03-14: 1 mg via INTRAVENOUS
  Filled 2015-03-14: qty 2

## 2015-03-14 MED ORDER — ONDANSETRON HCL 4 MG/2ML IJ SOLN
INTRAMUSCULAR | Status: DC | PRN
  Administered 2015-03-14: 4 mg via INTRAVENOUS

## 2015-03-14 MED ORDER — SUFENTANIL CITRATE 50 MCG/ML IV SOLN
INTRAVENOUS | Status: AC
  Filled 2015-03-14: qty 1

## 2015-03-14 MED ORDER — MORPHINE SULFATE (PF) 2 MG/ML IV SOLN: 2.0000 mg | INTRAVENOUS | Status: DC | PRN

## 2015-03-14 MED ORDER — SUFENTANIL CITRATE 50 MCG/ML IV SOLN
INTRAVENOUS | Status: DC | PRN
  Administered 2015-03-14 (×2): 10 ug via INTRAVENOUS

## 2015-03-14 MED ORDER — PHENOL 1.4 % MT LIQD: 1.0000 | OROMUCOSAL | Status: DC | PRN

## 2015-03-14 MED ORDER — LIDOCAINE HCL (CARDIAC) 20 MG/ML IV SOLN
INTRAVENOUS | Status: AC
Start: 2015-03-14 — End: 2015-03-14
  Filled 2015-03-14: qty 5

## 2015-03-14 MED ORDER — LIDOCAINE HCL (CARDIAC) 20 MG/ML IV SOLN
INTRAVENOUS | Status: DC | PRN
  Administered 2015-03-14: 100 mg via INTRAVENOUS

## 2015-03-14 MED ORDER — CEFAZOLIN SODIUM-DEXTROSE 2-3 GM-% IV SOLR
INTRAVENOUS | Status: DC | PRN
  Administered 2015-03-14: 2 g via INTRAVENOUS

## 2015-03-14 MED ORDER — DEXAMETHASONE SODIUM PHOSPHATE 4 MG/ML IJ SOLN
INTRAMUSCULAR | Status: AC
  Filled 2015-03-14: qty 1

## 2015-03-14 SURGICAL SUPPLY — 34 items
BNDG GAUZE ELAST 4 BULKY (GAUZE/BANDAGES/DRESSINGS) ×3 IMPLANT
COVER PERINEAL POST (MISCELLANEOUS) ×3 IMPLANT
COVER SURGICAL LIGHT HANDLE (MISCELLANEOUS) ×3 IMPLANT
DRSG MEPILEX BORDER 4X4 (GAUZE/BANDAGES/DRESSINGS) ×6 IMPLANT
DURAPREP 26ML APPLICATOR (WOUND CARE) ×3 IMPLANT
ELECT REM PT RETURN 9FT ADLT (ELECTROSURGICAL) ×3
ELECTRODE REM PT RTRN 9FT ADLT (ELECTROSURGICAL) ×1 IMPLANT
GAUZE XEROFORM 1X8 LF (GAUZE/BANDAGES/DRESSINGS) ×3 IMPLANT
GLOVE BIOGEL PI IND STRL 7.0 (GLOVE) ×2 IMPLANT
GLOVE BIOGEL PI IND STRL 8 (GLOVE) ×1 IMPLANT
GLOVE BIOGEL PI INDICATOR 7.0 (GLOVE) ×4
GLOVE BIOGEL PI INDICATOR 8 (GLOVE) ×2
GLOVE ECLIPSE 6.5 STRL STRAW (GLOVE) ×3 IMPLANT
GLOVE ORTHO TXT STRL SZ7.5 (GLOVE) ×3 IMPLANT
GOWN STRL REUS W/ TWL LRG LVL3 (GOWN DISPOSABLE) ×1 IMPLANT
GOWN STRL REUS W/ TWL XL LVL3 (GOWN DISPOSABLE) ×1 IMPLANT
GOWN STRL REUS W/TWL LRG LVL3 (GOWN DISPOSABLE) ×2
GOWN STRL REUS W/TWL XL LVL3 (GOWN DISPOSABLE) ×2
GUIDEPIN 3.2X17.5 THRD DISP (PIN) ×3 IMPLANT
HFN RH 130 DEG 9MM X 360MM (Nail) ×3 IMPLANT
KIT BASIN OR (CUSTOM PROCEDURE TRAY) ×3 IMPLANT
KIT ROOM TURNOVER OR (KITS) ×3 IMPLANT
NS IRRIG 1000ML POUR BTL (IV SOLUTION) ×3 IMPLANT
PACK GENERAL/GYN (CUSTOM PROCEDURE TRAY) ×3 IMPLANT
PAD ARMBOARD 7.5X6 YLW CONV (MISCELLANEOUS) ×6 IMPLANT
SCREW LAG HIP NAIL 10.5X95 (Screw) ×3 IMPLANT
STAPLER VISISTAT 35W (STAPLE) ×3 IMPLANT
SUT VIC AB 0 CT1 27 (SUTURE) ×2
SUT VIC AB 0 CT1 27XBRD ANBCTR (SUTURE) ×1 IMPLANT
SUT VIC AB 2-0 CT1 27 (SUTURE) ×2
SUT VIC AB 2-0 CT1 TAPERPNT 27 (SUTURE) ×1 IMPLANT
TOWEL OR 17X24 6PK STRL BLUE (TOWEL DISPOSABLE) ×3 IMPLANT
TOWEL OR 17X26 10 PK STRL BLUE (TOWEL DISPOSABLE) ×3 IMPLANT
WATER STERILE IRR 1000ML POUR (IV SOLUTION) ×3 IMPLANT

## 2015-03-14 NOTE — ED Provider Notes (Signed)
CSN: [id number]     Arrival date & time 03/14/15  1259 History   First MD Initiated Contact with Patient 03/14/15 1309     Chief Complaint  Patient presents with  . Fall  . Hip Pain     (Consider location/radiation/quality/duration/timing/severity/associated sxs/prior Treatment) Patient is a 81 y.o. female presenting with hip pain.  Hip Pain This is a new problem. The current episode started less than 1 hour ago. The problem occurs constantly. Pertinent negatives include no chest pain and no shortness of breath. Nothing aggravates the symptoms. Nothing relieves the symptoms. She has tried nothing for the symptoms. The treatment provided no relief.    Past Medical History  Diagnosis Date  . Osteoporosis    History reviewed. No pertinent past surgical history. No family history on file. Social History  Substance Use Topics  . Smoking status: Never Smoker   . Smokeless tobacco: None  . Alcohol Use: No   OB History    No data available     Review of Systems  Constitutional: Negative for fever and chills.  Respiratory: Negative for shortness of breath.   Cardiovascular: Negative for chest pain.  Gastrointestinal: Positive for nausea. Negative for vomiting, diarrhea and constipation.  Musculoskeletal: Positive for gait problem.       Right hip pain  All other systems reviewed and are negative.     Allergies  Penicillins and Codeine  Home Medications   Prior to Admission medications   Medication Sig Start Date End Date Taking? Authorizing Provider  acetaminophen (TYLENOL) 500 MG tablet Take 500 mg by mouth every 6 (six) hours as needed for mild pain.   Yes Historical Provider, MD  Glucosamine-Chondroitin (OSTEO BI-FLEX REGULAR STRENGTH PO) Take 1 tablet by mouth 2 (two) times daily.   Yes Historical Provider, MD  Multiple Vitamins-Minerals (MULTIVITAMIN WITH MINERALS) tablet Take 1 tablet by mouth daily.   Yes Historical Provider, MD  naproxen sodium (ALEVE) 220 MG  tablet Take 220 mg by mouth daily as needed (pain).   Yes Historical Provider, MD  sertraline (ZOLOFT) 50 MG tablet Take 50 mg by mouth daily. 02/18/15  Yes Historical Provider, MD  fluticasone (CUTIVATE) 0.05 % cream Apply 1 application topically daily as needed. 12/07/14   Historical Provider, MD   BP 175/72 mmHg  Pulse 73  Resp 11  Ht 5\' 2"  (1.575 m)  Wt 109 lb (49.442 kg)  BMI 19.93 kg/m2  SpO2 100% Physical Exam  Constitutional: She is oriented to person, place, and time. She appears well-developed and well-nourished.  HENT:  Head: Normocephalic and atraumatic.  Eyes: Conjunctivae and EOM are normal. Right eye exhibits no discharge. Left eye exhibits no discharge.  Cardiovascular: Normal rate and regular rhythm.   Pulmonary/Chest: Effort normal and breath sounds normal. No respiratory distress.  Abdominal: Soft. She exhibits no distension. There is no tenderness. There is no rebound.  Musculoskeletal: Normal range of motion. She exhibits no edema or tenderness.  Neurological: She is alert and oriented to person, place, and time.  Skin: Skin is warm and dry.  Nursing note and vitals reviewed.   ED Course  Procedures (including critical care time) Labs Review Labs Reviewed  COMPREHENSIVE METABOLIC PANEL - Abnormal; Notable for the following:    Glucose, Bld 103 (*)    BUN 28 (*)    Creatinine, Ser 1.08 (*)    ALT 13 (*)    GFR calc non Af Amer 47 (*)    GFR calc Af Amer 54 (*)  All other components within normal limits  URINALYSIS, ROUTINE W REFLEX MICROSCOPIC (NOT AT [facility]) - Abnormal; Notable for the following:    APPearance HAZY (*)    Ketones, ur 15 (*)    All other components within normal limits  CBC WITH DIFFERENTIAL/PLATELET  PROTIME-INR    Imaging Review Dg Pelvis Portable  03/14/2015   CLINICAL DATA:  She was leaning against an elevator door that opened and she fell backwards landing on her rt hip. Severe pain rt hip.  EXAM: PORTABLE PELVIS 1-2 VIEWS   COMPARISON:  08/06/2010  FINDINGS: There is a comminuted intertrochanteric fracture of the right hip, associated with varus angulation. No dislocation. The pelvis and left hip appear intact.  IMPRESSION: Intertrochanteric fracture right hip.   Electronically Signed   By: Nolon Nations M.D.   On: 03/14/2015 13:49   Dg Chest Portable 1 View  03/14/2015   CLINICAL DATA:  [place] today.  Right hip fracture.  EXAM: PORTABLE CHEST 1 VIEW  COMPARISON:  None.  FINDINGS: Cardiac silhouette is normal in size. Aorta is mildly tortuous. No mediastinal or hilar masses or evidence of adenopathy.  Clear lungs.  No pleural effusion or pneumothorax.  Bony thorax is demineralized but grossly intact.  IMPRESSION: No acute cardiopulmonary disease.   Electronically Signed   By: Lajean Manes M.D.   On: 03/14/2015 13:50   I have personally reviewed and evaluated these images and lab results as part of my medical decision-making.   EKG Interpretation None      MDM   Final diagnoses:  Closed right hip fracture, initial encounter   81 yo F fell landing on right hip with significant pain in right hip and unable to move it. Pain with ROM and palpation on exam. Distal NVI. Will give pain/nausea meds and get XR.  XR c/w intertrochanteric hip fx. Valium given for muscle spasms, [facility] consulted (Dr. Rush Farmer) and requests transfer there and admission to medicine. Medicine consulted for admission.     Merrily Pew, MD 03/14/15 [phone]

## 2015-03-14 NOTE — Anesthesia Preprocedure Evaluation (Signed)
Anesthesia Evaluation  Patient identified by MRN, date of birth, ID band Patient awake    Reviewed: Allergy & Precautions, NPO status , Patient's Chart, lab work & pertinent test results  Airway Mallampati: II   Neck ROM: full    Dental   Pulmonary neg pulmonary ROS,    breath sounds clear to auscultation       Cardiovascular negative cardio ROS   Rhythm:regular Rate:Normal     Neuro/Psych    GI/Hepatic   Endo/Other  Osteoporosis.  Renal/GU      Musculoskeletal  (+) Arthritis , Rheumatoid disorders,    Abdominal   Peds  Hematology   Anesthesia Other Findings   Reproductive/Obstetrics                             Anesthesia Physical Anesthesia Plan  ASA: II  Anesthesia Plan: General   Post-op Pain Management:    Induction: Intravenous  Airway Management Planned: Oral ETT  Additional Equipment:   Intra-op Plan:   Post-operative Plan: Extubation in OR  Informed Consent: I have reviewed the patients History and Physical, chart, labs and discussed the procedure including the risks, benefits and alternatives for the proposed anesthesia with the patient or authorized representative who has indicated his/her understanding and acceptance.     Plan Discussed with: CRNA, Anesthesiologist and Surgeon  Anesthesia Plan Comments:         Anesthesia Quick Evaluation

## 2015-03-14 NOTE — ED Notes (Signed)
Pt's 02 sat decreased after valium administration,  Placed pt on 02 at 2liters, and sat increased to 88%.  Increased to 2.5 liters and 02 sat 100% at present.  Pt says feels more relaxed.

## 2015-03-14 NOTE — Consult Note (Signed)
Reason for Consult:  Right hip fracture Referring Physician:  [facility] Michaela Simpson is an 81 y.o. female.  HPI:   81 yo female who is very active sustained an accidental mechancal fall today.  After severe right hip pain and the inability to ambulate, she was taken to [facility] and found to have a right hip fracture.  She was transferred to [facility] for definitive treatment.  She reports significant right hip pain and denies other injuries.  She denies CP/SOB or syncopal episode.  She is a very active individual.  Past Medical History  Diagnosis Date  . Osteoporosis   . Rheumatoid arthritis     History reviewed. No pertinent past surgical history.  No family history on file.  Social History:  reports that she has never smoked. She does not have any smokeless tobacco history on file. She reports that she does not drink alcohol or use illicit drugs.  Allergies:  Allergies  Allergen Reactions  . Penicillins Other (See Comments)    Child hood allergy  . Codeine Nausea And Vomiting and Rash    Medications: I have reviewed the patient's current medications.  Results for orders placed or performed during the hospital encounter of 03/14/15 (from the past 48 hour(s))  CBC with Differential     Status: None   Collection Time: 03/14/15  2:19 PM  Result Value Ref Range   WBC 9.7 4.0 - 10.5 K/uL   RBC 4.61 3.87 - 5.11 MIL/uL   Hemoglobin 14.1 12.0 - 15.0 g/dL   HCT 42.6 36.0 - 46.0 %   MCV 92.4 78.0 - 100.0 fL   MCH 30.6 26.0 - 34.0 pg   MCHC 33.1 30.0 - 36.0 g/dL   RDW 13.5 11.5 - 15.5 %   Platelets 268 150 - 400 K/uL   Neutrophils Relative % 73 %   Neutro Abs 7.1 1.7 - 7.7 K/uL   Lymphocytes Relative 19 %   Lymphs Abs 1.9 0.7 - 4.0 K/uL   Monocytes Relative 7 %   Monocytes Absolute 0.7 0.1 - 1.0 K/uL   Eosinophils Relative 1 %   Eosinophils Absolute 0.1 0.0 - 0.7 K/uL   Basophils Relative 0 %   Basophils Absolute 0.0 0.0 - 0.1 K/uL   Comprehensive metabolic panel     Status: Abnormal   Collection Time: 03/14/15  2:19 PM  Result Value Ref Range   Sodium 139 135 - 145 mmol/L   Potassium 3.7 3.5 - 5.1 mmol/L   Chloride 105 101 - 111 mmol/L   CO2 22 22 - 32 mmol/L   Glucose, Bld 103 (H) 65 - 99 mg/dL   BUN 28 (H) 6 - 20 mg/dL   Creatinine, Ser 1.08 (H) 0.44 - 1.00 mg/dL   Calcium 9.6 8.9 - 10.3 mg/dL   Total Protein 7.1 6.5 - 8.1 g/dL   Albumin 4.4 3.5 - 5.0 g/dL   AST 27 15 - 41 U/L   ALT 13 (L) 14 - 54 U/L   Alkaline Phosphatase 70 38 - 126 U/L   Total Bilirubin 0.6 0.3 - 1.2 mg/dL   GFR calc non Af Amer 47 (L) >60 mL/min   GFR calc Af Amer 54 (L) >60 mL/min    Comment: (NOTE) The eGFR has been calculated using the CKD EPI equation. This calculation has not been validated in all clinical situations. eGFR's persistently <60 mL/min signify possible Chronic Kidney Disease.    Anion gap 12 5 - 15  Protime-INR     Status: None   Collection Time: 03/14/15  2:19 PM  Result Value Ref Range   Prothrombin Time 13.3 11.6 - 15.2 seconds   INR 0.99 0.00 - 1.49  Urinalysis, Routine w reflex microscopic (not at [facility])     Status: Abnormal   Collection Time: 03/14/15  2:21 PM  Result Value Ref Range   Color, Urine YELLOW YELLOW   APPearance HAZY (A) CLEAR   Specific Gravity, Urine 1.010 1.005 - 1.030   pH 7.0 5.0 - 8.0   Glucose, UA NEGATIVE NEGATIVE mg/dL   Hgb urine dipstick NEGATIVE NEGATIVE   Bilirubin Urine NEGATIVE NEGATIVE   Ketones, ur 15 (A) NEGATIVE mg/dL   Protein, ur NEGATIVE NEGATIVE mg/dL   Urobilinogen, UA 0.2 0.0 - 1.0 mg/dL   Nitrite NEGATIVE NEGATIVE   Leukocytes, UA NEGATIVE NEGATIVE    Comment: MICROSCOPIC NOT DONE ON URINES WITH NEGATIVE PROTEIN, BLOOD, LEUKOCYTES, NITRITE, OR GLUCOSE <1000 mg/dL.    Dg Pelvis Portable  03/14/2015   CLINICAL DATA:  She was leaning against an elevator door that opened and she fell backwards landing on her rt hip. Severe pain rt hip.  EXAM: PORTABLE PELVIS 1-2  VIEWS  COMPARISON:  08/06/2010  FINDINGS: There is a comminuted intertrochanteric fracture of the right hip, associated with varus angulation. No dislocation. The pelvis and left hip appear intact.  IMPRESSION: Intertrochanteric fracture right hip.   Electronically Signed   By: Nolon Nations M.D.   On: 03/14/2015 13:49   Dg Chest Portable 1 View  03/14/2015   CLINICAL DATA:  [place] today.  Right hip fracture.  EXAM: PORTABLE CHEST 1 VIEW  COMPARISON:  None.  FINDINGS: Cardiac silhouette is normal in size. Aorta is mildly tortuous. No mediastinal or hilar masses or evidence of adenopathy.  Clear lungs.  No pleural effusion or pneumothorax.  Bony thorax is demineralized but grossly intact.  IMPRESSION: No acute cardiopulmonary disease.   Electronically Signed   By: Lajean Manes M.D.   On: 03/14/2015 13:50    Review of Systems  All other systems reviewed and are negative.  Blood pressure 133/53, pulse 77, temperature 98.1 F (36.7 C), temperature source Oral, resp. rate 18, height '5\' 2"'  (1.575 m), weight 49.442 kg (109 lb), SpO2 98 %. Physical Exam  Constitutional: She is oriented to person, place, and time. She appears well-developed and well-nourished.  HENT:  Head: Normocephalic and atraumatic.  Eyes: EOM are normal. Pupils are equal, round, and reactive to light.  Neck: Normal range of motion. Neck supple.  Cardiovascular: Normal rate and regular rhythm.   Respiratory: Effort normal and breath sounds normal.  GI: Soft. Bowel sounds are normal.  Musculoskeletal:       Right hip: She exhibits decreased range of motion, decreased strength, tenderness, bony tenderness and deformity.  Neurological: She is alert and oriented to person, place, and time.  Skin: Skin is warm and dry.  Psychiatric: She has a normal mood and affect.    Assessment/Plan: Right displaced and unstable intertrochanteric hip fracture 1)  I have spoken to her and her daughter in length.  They understand full the  recommendation for surgery given her right hip fracture.  The risks and benefits have been discussed in detail.  We will proceed to surgery this evening for open reduction/internal of her right hip fracture.  Mcarthur Rossetti 03/14/2015, 8:28 PM

## 2015-03-14 NOTE — ED Notes (Signed)
Pt was at [place] and was leaning against elevator doors and when they opened pt fell backwards landing on right hip. Pt had no LOC. Vitals 194/82, pulse 82, resp 20, O2 99% on RA per EMS. Pt unable to straighten right leg. EMS reports pt has protrusion on right buttocks. Pt very tender to right lateral aspect of hip with any movement.

## 2015-03-14 NOTE — ED Notes (Signed)
Pt reports frequent muscle spasms in r hip.  Dr. Dayna Barker aware and valium ordered and administered.

## 2015-03-14 NOTE — Transfer of Care (Signed)
Immediate Anesthesia Transfer of Care Note  Patient: Michaela Simpson  Procedure(s) Performed: Procedure(s): RIGHT INTERTROCHANTRIC INTRAMEDULLARY (IM) NAIL  (Right)  Patient Location: PACU  Anesthesia Type:General  Level of Consciousness: oriented, sedated, patient cooperative and responds to stimulation  Airway & Oxygen Therapy: Patient Spontanous Breathing and Patient connected to nasal cannula oxygen  Post-op Assessment: Report given to RN, Post -op Vital signs reviewed and stable and Patient moving all extremities X 4  Post vital signs: Reviewed and stable  Last Vitals:  Filed Vitals:   03/14/15 1955  BP: 133/53  Pulse: 77  Temp: 36.7 C  Resp: 18    Complications: No apparent anesthesia complications

## 2015-03-14 NOTE — ED Notes (Signed)
[facility] at bedside for transport to [facility].

## 2015-03-14 NOTE — ED Notes (Signed)
Hospitalist at bedside 

## 2015-03-14 NOTE — Anesthesia Procedure Notes (Signed)
Procedure Name: Intubation Date/Time: 03/14/2015 9:10 PM Performed by: Claris Che Pre-anesthesia Checklist: Patient identified, Emergency Drugs available, Suction available, Patient being monitored and Timeout performed Patient Re-evaluated:Patient Re-evaluated prior to inductionOxygen Delivery Method: Circle system utilized Preoxygenation: Pre-oxygenation with 100% oxygen Intubation Type: IV induction, Rapid sequence and Cricoid Pressure applied Ventilation: Mask ventilation without difficulty Laryngoscope Size: Mac and 3 Grade View: Grade I Tube size: 8.0 mm Number of attempts: 1 Airway Equipment and Method: Stylet Placement Confirmation: ETT inserted through vocal cords under direct vision,  positive ETCO2 and breath sounds checked- equal and bilateral Secured at: 22 cm Tube secured with: Tape Dental Injury: Teeth and Oropharynx as per pre-operative assessment

## 2015-03-14 NOTE — ED Notes (Addendum)
Report given to [facility], Paramedic from [place].

## 2015-03-14 NOTE — ED Notes (Signed)
Pt and daughter report pt is very sensitive to pain medication. Dr. Dayna Barker notified and gave order to only give 2mg  of Morphine IV instead of 4mg . Order initiated.

## 2015-03-14 NOTE — ED Notes (Signed)
Report called to [employer], RN at [facility].

## 2015-03-14 NOTE — Brief Op Note (Signed)
03/14/2015  10:23 PM  PATIENT:  Michaela Simpson  81 y.o. female  PRE-OPERATIVE DIAGNOSIS:  right intertroch fracture  POST-OPERATIVE DIAGNOSIS:  right intertroch fracture  PROCEDURE:  Procedure(s): RIGHT INTERTROCHANTRIC INTRAMEDULLARY (IM) NAIL  (Right)  SURGEON:  Surgeon(s) and Role:    * Mcarthur Rossetti, MD - Primary  ASSISTANTS: none   ANESTHESIA:   general  EBL:  Total I/O In: 1000 [I.V.:1000] Out: -   BLOOD ADMINISTERED:none  DRAINS: none   LOCAL MEDICATIONS USED:  NONE  SPECIMEN:  No Specimen  DISPOSITION OF SPECIMEN:  N/A  COUNTS:  YES  TOURNIQUET:  * No tourniquets in log *  DICTATION: .Other Dictation: Dictation Number [id number]  PLAN OF CARE: Admit to inpatient   PATIENT DISPOSITION:  PACU - hemodynamically stable.   Delay start of Pharmacological VTE agent (>24hrs) due to surgical blood loss or risk of bleeding: no

## 2015-03-14 NOTE — H&P (Signed)
[facility] History and Physical  Michaela Simpson SNK:[id number] DOB: 02-26-1932 DOA: 03/14/2015  Referring physician: Dr. Dayna Barker, ER PCP: No primary care provider on file.   Chief Complaint: fall  HPI: Michaela Simpson is a 81 y.o. female who does not have any significant past medical history, was at church today when she suffered a mechanical fall. She did not have any head injury, she did not have any lose consciousness. She landed on her right side causing a right sided hip fracture. She was otherwise in her usual state of health. She has not had any chest pain, shortness of breath, nausea, vomiting, diarrhea, dysuria or any other complaints. She was evaluated in the emergency room and found to have a hip fracture. Since there is no orthopedic coverage at [facility], she will be transferred to [facility] for further management.   Review of Systems:  Pertinent positives as per HPI, otherwise negative  Past Medical History  Diagnosis Date  . Osteoporosis   . Rheumatoid arthritis    History reviewed. No pertinent past surgical history. Social History:  reports that she has never smoked. She does not have any smokeless tobacco history on file. She reports that she does not drink alcohol or use illicit drugs.  Allergies  Allergen Reactions  . Penicillins Other (See Comments)    Child hood allergy  . Codeine Nausea And Vomiting and Rash    Family history: sister had dementia, father broke his back when he was young  Prior to Admission medications   Medication Sig Start Date End Date Taking? Authorizing Provider  acetaminophen (TYLENOL) 500 MG tablet Take 500 mg by mouth every 6 (six) hours as needed for mild pain.   Yes Historical Provider, MD  Glucosamine-Chondroitin (OSTEO BI-FLEX REGULAR STRENGTH PO) Take 1 tablet by mouth 2 (two) times daily.   Yes Historical Provider, MD  Multiple Vitamins-Minerals (MULTIVITAMIN WITH MINERALS) tablet Take 1 tablet by mouth  daily.   Yes Historical Provider, MD  naproxen sodium (ALEVE) 220 MG tablet Take 220 mg by mouth daily as needed (pain).   Yes Historical Provider, MD  sertraline (ZOLOFT) 50 MG tablet Take 50 mg by mouth daily. 02/18/15  Yes Historical Provider, MD  fluticasone (CUTIVATE) 0.05 % cream Apply 1 application topically daily as needed. 12/07/14   Historical Provider, MD   Physical Exam: Filed Vitals:   03/14/15 1700 03/14/15 1730 03/14/15 1800 03/14/15 1855  BP: 171/58 152/92 189/82 146/82  Pulse: 67 73 87 81  Temp:    97.9 F (36.6 C)  TempSrc:    Oral  Resp: 19 13 24 19   Height:      Weight:      SpO2: 100% 98% 97% 100%    Wt Readings from Last 3 Encounters:  03/14/15 49.442 kg (109 lb)    General:  Appears calm and comfortable Eyes: PERRL, normal lids, irises & conjunctiva ENT: grossly normal hearing, lips & tongue Neck: no LAD, masses or thyromegaly Cardiovascular: RRR, no m/r/g. No LE edema. Telemetry: SR, no arrhythmias  Respiratory: CTA bilaterally, no w/r/r. Normal respiratory effort. Abdomen: soft, ntnd Skin: no rash or induration seen on limited exam Musculoskeletal: right lower extremity is shortened and externally rotated Psychiatric: grossly normal mood and affect, speech fluent and appropriate Neurologic: grossly non-focal.          Labs on Admission:  Basic Metabolic Panel:  Recent Labs Lab 03/14/15 1419  NA 139  K 3.7  CL 105  CO2 22  GLUCOSE 103*  BUN 28*  CREATININE 1.08*  CALCIUM 9.6   Liver Function Tests:  Recent Labs Lab 03/14/15 1419  AST 27  ALT 13*  ALKPHOS 70  BILITOT 0.6  PROT 7.1  ALBUMIN 4.4   No results for input(s): LIPASE, AMYLASE in the last 168 hours. No results for input(s): AMMONIA in the last 168 hours. CBC:  Recent Labs Lab 03/14/15 1419  WBC 9.7  NEUTROABS 7.1  HGB 14.1  HCT 42.6  MCV 92.4  PLT 268   Cardiac Enzymes: No results for input(s): CKTOTAL, CKMB, CKMBINDEX, TROPONINI in the last 168  hours.  BNP (last 3 results) No results for input(s): BNP in the last 8760 hours.  ProBNP (last 3 results) No results for input(s): PROBNP in the last 8760 hours.  CBG: No results for input(s): GLUCAP in the last 168 hours.  Radiological Exams on Admission: Dg Pelvis Portable  03/14/2015   CLINICAL DATA:  She was leaning against an elevator door that opened and she fell backwards landing on her rt hip. Severe pain rt hip.  EXAM: PORTABLE PELVIS 1-2 VIEWS  COMPARISON:  08/06/2010  FINDINGS: There is a comminuted intertrochanteric fracture of the right hip, associated with varus angulation. No dislocation. The pelvis and left hip appear intact.  IMPRESSION: Intertrochanteric fracture right hip.   Electronically Signed   By: Nolon Nations M.D.   On: 03/14/2015 13:49   Dg Chest Portable 1 View  03/14/2015   CLINICAL DATA:  [place] today.  Right hip fracture.  EXAM: PORTABLE CHEST 1 VIEW  COMPARISON:  None.  FINDINGS: Cardiac silhouette is normal in size. Aorta is mildly tortuous. No mediastinal or hilar masses or evidence of adenopathy.  Clear lungs.  No pleural effusion or pneumothorax.  Bony thorax is demineralized but grossly intact.  IMPRESSION: No acute cardiopulmonary disease.   Electronically Signed   By: Lajean Manes M.D.   On: 03/14/2015 13:50    Assessment/Plan Active Problems:   Closed right hip fracture   Rheumatoid arthritis   Fall   1. Right hip fracture. Case was discussed with Dr. Ninfa Linden at [facility] who will see the patient on arrival to [facility]. She will receive pain management and physical therapy. Further orders per hip fracture order set 2. Rheumatoid arthritis. Patient reports that she does not take any controller medications, and just uses NSAIDS as needed. 3. Pre op evaluation. Patient does not have any significant medical problems. Will order EKG to further risk stratify. If EKG is unremarkable, I would assume she would be considered low risk and can proceed with  surgery. 4. Fall, mechanical fall.    Code Status: fulll code DVT Prophylaxis: heparin Family Communication: discussed with patient and daughter at the bedside Disposition Plan: Transfer to [facility] for [facility] care  Time spent: 31mins  MEMON,JEHANZEB [facility] Pager [phone]

## 2015-03-15 DIAGNOSIS — W19XXXS Unspecified fall, sequela: Secondary | ICD-10-CM

## 2015-03-15 DIAGNOSIS — N179 Acute kidney failure, unspecified: Secondary | ICD-10-CM

## 2015-03-15 DIAGNOSIS — W19XXXD Unspecified fall, subsequent encounter: Secondary | ICD-10-CM

## 2015-03-15 DIAGNOSIS — R110 Nausea: Secondary | ICD-10-CM

## 2015-03-15 DIAGNOSIS — S72001S Fracture of unspecified part of neck of right femur, sequela: Secondary | ICD-10-CM

## 2015-03-15 LAB — BASIC METABOLIC PANEL
Anion gap: 8 (ref 5–15)
BUN: 23 mg/dL — ABNORMAL HIGH (ref 6–20)
CHLORIDE: 108 mmol/L (ref 101–111)
CO2: 23 mmol/L (ref 22–32)
Calcium: 8.6 mg/dL — ABNORMAL LOW (ref 8.9–10.3)
Creatinine, Ser: 1.18 mg/dL — ABNORMAL HIGH (ref 0.44–1.00)
GFR calc non Af Amer: 42 mL/min — ABNORMAL LOW (ref >60)
GFR, EST AFRICAN AMERICAN: 49 mL/min — AB (ref >60)
Glucose, Bld: 155 mg/dL — ABNORMAL HIGH (ref 65–99)
POTASSIUM: 4.4 mmol/L (ref 3.5–5.1)
SODIUM: 139 mmol/L (ref 135–145)

## 2015-03-15 LAB — CBC
HCT: 30.7 % — ABNORMAL LOW (ref 36.0–46.0)
HEMOGLOBIN: 10.1 g/dL — AB (ref 12.0–15.0)
MCH: 30.7 pg (ref 26.0–34.0)
MCHC: 32.9 g/dL (ref 30.0–36.0)
MCV: 93.3 fL (ref 78.0–100.0)
Platelets: 228 10*3/uL (ref 150–400)
RBC: 3.29 MIL/uL — AB (ref 3.87–5.11)
RDW: 14.1 % (ref 11.5–15.5)
WBC: 10.4 10*3/uL (ref 4.0–10.5)

## 2015-03-15 MED ORDER — ONDANSETRON HCL 4 MG/2ML IJ SOLN
4.0000 mg | Freq: Four times a day (QID) | INTRAMUSCULAR | Status: DC | PRN
  Administered 2015-03-15 – 2015-03-16 (×4): 4 mg via INTRAVENOUS
  Filled 2015-03-15 (×4): qty 2

## 2015-03-15 MED ORDER — HYDROCODONE-ACETAMINOPHEN 5-325 MG PO TABS: 1.0000 | ORAL_TABLET | Freq: Four times a day (QID) | ORAL | Status: DC | PRN

## 2015-03-15 MED ORDER — ENSURE ENLIVE PO LIQD
237.0000 mL | ORAL | Status: DC
  Administered 2015-03-15 – 2015-03-16 (×2): 237 mL via ORAL

## 2015-03-15 MED ORDER — ONDANSETRON HCL 4 MG/2ML IJ SOLN: 4.0000 mg | Freq: Four times a day (QID) | INTRAMUSCULAR | Status: DC | PRN

## 2015-03-15 MED ORDER — ASPIRIN EC 325 MG PO TBEC: 325.0000 mg | DELAYED_RELEASE_TABLET | Freq: Every day | ORAL | Status: DC

## 2015-03-15 MED ORDER — ONDANSETRON HCL 4 MG PO TABS: 4.0000 mg | ORAL_TABLET | Freq: Four times a day (QID) | ORAL | Status: DC | PRN

## 2015-03-15 MED ORDER — BOOST / RESOURCE BREEZE PO LIQD
1.0000 | Freq: Two times a day (BID) | ORAL | Status: DC
  Administered 2015-03-15 – 2015-03-17 (×4): 1 via ORAL

## 2015-03-15 NOTE — Op Note (Signed)
NAMEJELESA, MANGINI NO.:  [id number]  MEDICAL RECORD NO.:  [id number]  LOCATION:  [id number]                        FACILITY:  [facility]  PHYSICIAN:  Lind Guest. Ninfa Linden, M.D.DATE OF BIRTH:  23-Jun-1931  DATE OF PROCEDURE:  03/14/2015 DATE OF DISCHARGE:                              OPERATIVE REPORT   PREOPERATIVE DIAGNOSIS:  Right hip displaced unstable intertrochanteric femur fracture.  POSTOPERATIVE DIAGNOSIS:  Right hip displaced unstable intertrochanteric femur fracture.  PROCEDURE:  Open reduction and internal fixation of right intertrochanteric hip fracture using hip fracture nail and screw.  IMPLANTS:  Biomet Affixus hip fracture nail 9 mm x 360 mm, 10.5 mm x 95 mm lag screw.  SURGEON:  Lind Guest. Ninfa Linden, M.D.  ANESTHESIA:  General.  ANTIBIOTICS:  2 g IV Ancef.  BLOOD LOSS:  Less than 100 mL.  COMPLICATIONS:  None.  INDICATIONS:  Michaela Simpson is an 81 year old very active female, who sustained an accidental mechanical fall earlier this morning.  She was seen at an outside hospital, who had no orthopedic coverage.  She was transferred down to [facility].  She is a healthy individual as well and was cleared from medical standpoint for surgery.  She understands she has an unstable intertrochanteric hip fracture, and we are recommending open reduction and internal fixation of this fracture.  She understands the risk of acute blood loss anemia, nerve and vessel injury, fracture and hardware failure, and the other perioperative risks associated with her age.  After thorough discussion of risks and benefits of surgery, she does understand the need to proceed with surgery.  Indeed given informed consent.  PROCEDURE DESCRIPTION:  After informed consent was obtained and appropriate right hip was marked, she was brought to the operating room and general anesthesia was obtained while she was on her stretcher.  She was next placed supine on the  fracture table with the right operative leg in inline with skeletal traction.  Her left hip placed in an abduction stirrup flexion and abducted out of the field with appropriate padding in the popliteal area, the perineal post was in place as well. Under direct fluoroscopy, we then reduced the fracture, retraction, internal rotation.  We then chose our femoral nail, basically keeping sterile box placed on top of the nail to get our diameter and length. We then prepped the right hip with DuraPrep and sterile drapes.  Time- out was called to identify correct patient, correct right hip.  We then made an incision just proximal to the greater trochanter, dissected down to the tip of the greater trochanter, which I can feel above finger.  I then placed temporary guide pin in antegrade fashion from the greater trochanter down past the lesser trochanter.  Using initiating reamer, we then opened up the femoral canal and then we removed the guide pin, we placed the rod without needing to ream down the femur to the level of the knee.  Through a separate stab incision, we then placed our lag screw in center-center position and brought the fracture together.  We locked the nail from the top and did not need to place distal interlocks.  We then removed the outrigger guide and irrigated both wounds with normal saline solution.  Closed the deep tissue with 0 Vicryl, followed by 2-0 Vicryl subcutaneous tissue, interrupted staples on the skin.  Well-padded sterile dressing was applied.  She was then taken off the fracture table.  Awakened, extubated, and taken to the recovery room in stable condition.  All final counts were correct. There were no complications noted.  Of note, the fracture fixation was all accomplished under direct fluoroscopy as well.  Postoperatively, we will allow her to weight bear as tolerated, and we will get her up with therapy tomorrow.     Lind Guest. Ninfa Linden,  M.D.     CYB/MEDQ  D:  03/14/2015  T:  03/14/2015  Job:  [id number]

## 2015-03-15 NOTE — Progress Notes (Signed)
[facility] Progress Note   Michaela Simpson  YWV:[id number]  DOB: [birth year]  DOA: 03/14/2015 PCP: No primary care provider on file.  Brief narrative: Michaela Simpson is a 81 y.o. female admitted with a mechanical fall and found to have a right hip fracture.   Subjective: Pain in hip is controlled. Nauseated this AM. No vomiting, abdominal pain, constipation or diarrhea.   Assessment/Plan: Principal Problem:   Closed right hip fracture - s/p OR/ IF last night - management per ortho - PT eval, ASA 325 daily for DVT prophylaxis  Nausea - PRN Zofran    Code Status:     Code Status Orders        Start     Ordered   03/14/15 2258  Full code   Continuous     03/14/15 2257    Advance Directive Documentation        Most Recent Value   Type of Advance Directive  Healthcare Power of [place], Living will   Pre-existing out of facility DNR order (yellow form or pink MOST form)     "MOST" Form in Place?       Disposition Plan: await PT eval DVT prophylaxis: heparin Consultants:ortho Procedures:ORIF  Antibiotics: Anti-infectives    Start     Dose/Rate Route Frequency Ordered Stop   03/15/15 0600  ceFAZolin (ANCEF) IVPB 1 g/50 mL premix     1 g 100 mL/hr over 30 Minutes Intravenous 3 times per day 03/14/15 2257 03/15/15 1459      Objective: Filed Weights   03/14/15 1314  Weight: 49.442 kg (109 lb)    Intake/Output Summary (Last 24 hours) at 03/15/15 1637 Last data filed at 03/15/15 1300  Gross per 24 hour  Intake   1320 ml  Output      0 ml  Net   1320 ml     Vitals Filed Vitals:   03/14/15 2223 03/14/15 2239 03/14/15 2254 03/15/15 0600  BP: 142/56 145/62 146/59 110/45  Pulse: 104 95 91 86  Temp: 97.7 F (36.5 C)  97.8 F (36.6 C) 97.5 F (36.4 C)  TempSrc:      Resp: 13 15 16 16   Height:      Weight:      SpO2: 97%  93% 100%    Exam:  General:  Pt is alert, not in acute distress  HEENT: No icterus, No thrush, oral mucosa  moist  Cardiovascular: regular rate and rhythm, S1/S2 No murmur  Respiratory: clear to auscultation bilaterally   Abdomen: Soft, +Bowel sounds, non tender, non distended, no guarding  MSK: No cyanosis or clubbing or pedal edema  Data Reviewed: Basic Metabolic Panel:  Recent Labs Lab 03/14/15 1419 03/14/15 2022 03/15/15 0535  NA 139  --  139  K 3.7  --  4.4  CL 105  --  108  CO2 22  --  23  GLUCOSE 103*  --  155*  BUN 28*  --  23*  CREATININE 1.08* 1.07* 1.18*  CALCIUM 9.6  --  8.6*   Liver Function Tests:  Recent Labs Lab 03/14/15 1419  AST 27  ALT 13*  ALKPHOS 70  BILITOT 0.6  PROT 7.1  ALBUMIN 4.4   No results for input(s): LIPASE, AMYLASE in the last 168 hours. No results for input(s): AMMONIA in the last 168 hours. CBC:  Recent Labs Lab 03/14/15 1419 03/14/15 2022 03/15/15 0535  WBC 9.7 11.8* 10.4  NEUTROABS 7.1  --   --  HGB 14.1 12.1 10.1*  HCT 42.6 37.0 30.7*  MCV 92.4 91.6 93.3  PLT 268 235 228   Cardiac Enzymes: No results for input(s): CKTOTAL, CKMB, CKMBINDEX, TROPONINI in the last 168 hours. BNP (last 3 results) No results for input(s): BNP in the last 8760 hours.  ProBNP (last 3 results) No results for input(s): PROBNP in the last 8760 hours.  CBG: No results for input(s): GLUCAP in the last 168 hours.  Recent Results (from the past 240 hour(s))  Surgical pcr screen     Status: None   Collection Time: 03/14/15  8:51 PM  Result Value Ref Range Status   MRSA, PCR NEGATIVE NEGATIVE Final   Staphylococcus aureus NEGATIVE NEGATIVE Final    Comment:        The Xpert SA Assay (FDA approved for NASAL specimens in patients over 65 years of age), is one component of a comprehensive surveillance program.  Test performance has been validated by [facility] for patients greater than or equal to 32 year old. It is not intended to diagnose infection nor to guide or monitor treatment.      Studies: Dg Pelvis Portable  03/14/2015    CLINICAL DATA:  She was leaning against an elevator door that opened and she fell backwards landing on her rt hip. Severe pain rt hip.  EXAM: PORTABLE PELVIS 1-2 VIEWS  COMPARISON:  08/06/2010  FINDINGS: There is a comminuted intertrochanteric fracture of the right hip, associated with varus angulation. No dislocation. The pelvis and left hip appear intact.  IMPRESSION: Intertrochanteric fracture right hip.   Electronically Signed   By: Nolon Nations M.D.   On: 03/14/2015 13:49   Dg Chest Portable 1 View  03/14/2015   CLINICAL DATA:  [place] today.  Right hip fracture.  EXAM: PORTABLE CHEST 1 VIEW  COMPARISON:  None.  FINDINGS: Cardiac silhouette is normal in size. Aorta is mildly tortuous. No mediastinal or hilar masses or evidence of adenopathy.  Clear lungs.  No pleural effusion or pneumothorax.  Bony thorax is demineralized but grossly intact.  IMPRESSION: No acute cardiopulmonary disease.   Electronically Signed   By: Lajean Manes M.D.   On: 03/14/2015 13:50   Dg C-arm 1-60 Min  03/14/2015   CLINICAL DATA:  Patient status post ORIF proximal right femur.  EXAM: LEFT FEMUR 2 VIEWS; DG C-ARM 61-120 MIN  COMPARISON:  03/14/2015 pelvic radiograph.  FINDINGS: Patient status post intra medullary rod and screw fixation of proximal right femur fracture. Four fluoroscopic intraoperative images were submitted for interpretation. Osseous fragment is demonstrated on the oblique view anterior to femoral neck.  IMPRESSION: Patient status post ORIF proximal right femur fracture.   Electronically Signed   By: Lovey Newcomer M.D.   On: 03/14/2015 22:33   Dg Femur Min 2 Views Left  03/14/2015   CLINICAL DATA:  Patient status post ORIF proximal right femur.  EXAM: LEFT FEMUR 2 VIEWS; DG C-ARM 61-120 MIN  COMPARISON:  03/14/2015 pelvic radiograph.  FINDINGS: Patient status post intra medullary rod and screw fixation of proximal right femur fracture. Four fluoroscopic intraoperative images were submitted for interpretation.  Osseous fragment is demonstrated on the oblique view anterior to femoral neck.  IMPRESSION: Patient status post ORIF proximal right femur fracture.   Electronically Signed   By: Lovey Newcomer M.D.   On: 03/14/2015 22:33    Scheduled Meds:  Scheduled Meds: . aspirin EC  325 mg Oral Q breakfast  . feeding supplement  1 Container Oral  BID BM  . feeding supplement (ENSURE ENLIVE)  237 mL Oral Q24H  . heparin  5,000 Units Subcutaneous 3 times per day  . sertraline  50 mg Oral Daily   Continuous Infusions: . sodium chloride 75 mL/hr at 03/14/15 2006  . sodium chloride 75 mL/hr at 03/14/15 2300    Time spent on care of this patient: 35 min   Arnold Line, MD 03/15/2015, 4:37 PM  LOS: 1 day   [facility] Office  [phone] Pager - Text Page per [email or web address] If 7PM-7AM, please contact night-coverage [email or web address]

## 2015-03-15 NOTE — Evaluation (Signed)
Physical Therapy Evaluation Patient Details Name: Michaela Simpson MRN: [id number] DOB: 1931-07-27 Today's Date: 03/15/2015   History of Present Illness  Patient is an 81 yo widowed female who fell at [employer] and sustained Rt intertrochanteric fx.  s/p ORIF, nail and screw fixation on 9/23  Clinical Impression  Patient lives alone in [place] with level entry, however, she will have limited assistance upon discharge home.  She was highly nauseated and vomited with mobility, therefore, she was not able to ambulate today.  She expects to discharge to [facility] after hospital for ongoing therapies prior to discharge home alone.  Therapy will continue to follow to assist with discharge planning and follow up recommendations. Will continue to follow patient while on this venue of care to progress mobility.    Follow Up Recommendations SNF;Supervision for mobility/OOB    Equipment Recommendations  Rolling walker with 5" wheels;3in1 (PT)    Recommendations for Other Services OT consult     Precautions / Restrictions Precautions Precautions: Fall Restrictions Weight Bearing Restrictions: Yes RLE Weight Bearing: Weight bearing as tolerated      Mobility  Bed Mobility Overal bed mobility: Needs Assistance Bed Mobility: Supine to Sit     Supine to sit: Mod assist     General bed mobility comments: use of Rt rail, mod cues for sequencing  Transfers Overall transfer level: Needs assistance Equipment used: Rolling walker (2 wheeled) Transfers: Sit to/from [facility] Sit to Stand: Mod assist Stand pivot transfers: Mod assist       General transfer comment: mod cues for Rt weight bearing, assist to manage RW, +2 for safety  Ambulation/Gait             General Gait Details: patient highly nauseated, small amount of vomiting after bed mobility  Stairs            Wheelchair Mobility    Modified Rankin (Stroke Patients Only)       Balance  Overall balance assessment: Needs assistance Sitting-balance support: Bilateral upper extremity supported;Feet supported Sitting balance-Leahy Scale: Poor Sitting balance - Comments: posterior lean, lending to slipping to EOB Postural control: Posterior lean Standing balance support: Bilateral upper extremity supported Standing balance-Leahy Scale: Poor Standing balance comment: mod cues for use of UEs to reduce pain on Rt leg                             Pertinent Vitals/Pain Pain Assessment: 0-10 Pain Score: 5  Pain Location: Rt hip Pain Descriptors / Indicators: Sore;Aching Pain Intervention(s): Limited activity within patient's tolerance;Monitored during session;Premedicated before session    Home Living Family/patient expects to be discharged to:: Skilled nursing facility                 Additional Comments: Patient requests to have rehab stay in [place]    Prior Function Level of Independence: Independent         Comments: active member in [employer] certified [employer]ance   Dominant Hand: Right    Extremity/Trunk Assessment   Upper Extremity Assessment: Overall WFL for tasks assessed           Lower Extremity Assessment: Generalized weakness      Cervical / Trunk Assessment: Normal  Communication   Communication: No difficulties  Cognition Arousal/Alertness: Awake/alert Behavior During Therapy: WFL for tasks assessed/performed Overall Cognitive Status: Within Functional Limits for tasks assessed  General Comments General comments (skin integrity, edema, etc.): Pt's son, Audry Pili, observing session    Exercises General Exercises - Lower Extremity Ankle Circles/Pumps: AROM;Right;10 reps;Seated Long Arc Quad: AROM;Right;10 reps;Seated Hip ABduction/ADduction: AROM;Both;10 reps;Seated (isometric hip addct (towel squeeze)) Hip Flexion/Marching: AAROM;Right;10 reps;Seated      Assessment/Plan    PT  Assessment Patient needs continued PT services  PT Diagnosis Difficulty walking;Generalized weakness;Acute pain   PT Problem List Decreased strength;Decreased activity tolerance;Decreased balance;Decreased mobility;Decreased knowledge of use of DME;Decreased safety awareness;Decreased knowledge of precautions;Pain  PT Treatment Interventions DME instruction;Gait training;Functional mobility training;Therapeutic activities;Therapeutic exercise;Balance training;Neuromuscular re-education;Patient/family education   PT Goals (Current goals can be found in the Care Plan section) Acute Rehab PT Goals Patient Stated Goal: get stronger before returning home. I want to [facility] Rehab PT Goal Formulation: With patient/family Time For Goal Achievement: 03/29/15 Potential to Achieve Goals: Good    Frequency Min 5X/week   Barriers to discharge Decreased caregiver support family works during day    Co-evaluation               End of Session   Activity Tolerance: Patient limited by pain;Other (comment) (and nausea) Patient left: in chair;with call bell/phone within reach;with family/visitor present Nurse Communication: Mobility status         Time: 5465-0354 PT Time Calculation (min) (ACUTE ONLY): 41 min   Charges:   PT Evaluation $Initial PT Evaluation Tier I: 1 Procedure PT Treatments $Therapeutic Exercise: 8-22 mins   PT G CodesMalka So, PT [phone]  [place] 03/15/2015, 2:14 PM

## 2015-03-15 NOTE — Progress Notes (Signed)
Subjective: 1 Day Post-Op Procedure(s) (LRB): RIGHT INTERTROCHANTRIC INTRAMEDULLARY (IM) NAIL  (Right) Patient reports pain as moderate.  Nausea and vomiting this am.  Acute blood loss anemia from her fracture and surgery.  Objective: Vital signs in last 24 hours: Temp:  [97.5 F (36.4 C)-98.1 F (36.7 C)] 97.5 F (36.4 C) (09/24 0600) Pulse Rate:  [62-104] 86 (09/24 0600) Resp:  [11-24] 16 (09/24 0600) BP: (110-190)/(45-97) 110/45 mmHg (09/24 0600) SpO2:  [88 %-100 %] 100 % (09/24 0600) Weight:  [49.442 kg (109 lb)] 49.442 kg (109 lb) (09/23 1314)  Intake/Output from previous day: 09/23 0701 - 09/24 0700 In: 1200 [I.V.:1200] Out: -  Intake/Output this shift:     Recent Labs  03/14/15 1419 03/14/15 2022 03/15/15 0535  HGB 14.1 12.1 10.1*    Recent Labs  03/14/15 2022 03/15/15 0535  WBC 11.8* 10.4  RBC 4.04 3.29*  HCT 37.0 30.7*  PLT 235 228    Recent Labs  03/14/15 1419 03/14/15 2022 03/15/15 0535  NA 139  --  139  K 3.7  --  4.4  CL 105  --  108  CO2 22  --  23  BUN 28*  --  23*  CREATININE 1.08* 1.07* 1.18*  GLUCOSE 103*  --  155*  CALCIUM 9.6  --  8.6*    Recent Labs  03/14/15 1419  INR 0.99    Sensation intact distally Intact pulses distally Dorsiflexion/Plantar flexion intact Incision: scant drainage  Assessment/Plan: 1 Day Post-Op Procedure(s) (LRB): RIGHT INTERTROCHANTRIC INTRAMEDULLARY (IM) NAIL  (Right) Up with therapy - WBAT with assistance right hip. Will likely need short-term SNF  BLACKMAN,CHRISTOPHER Y 03/15/2015, 9:17 AM

## 2015-03-15 NOTE — Progress Notes (Signed)
OT Cancellation Note  Patient Details Name: Michaela Simpson MRN: [id number] DOB: Feb 10, 1932   Cancelled Treatment:    Reason Eval/Treat Not Completed: OT screened. Pt is Medicare and current D/C plan is SNF. No apparent immediate acute care OT needs, therefore will defer OT to SNF. If OT eval is needed please call Acute Rehab Dept. at [phone] or text page OT at [phone].    Benito Mccreedy OTR/L [phone] 03/15/2015, 3:12 PM

## 2015-03-15 NOTE — Care Management Note (Signed)
Case Management Note  Patient Details  Name: Michaela Simpson MRN: [id number] Date of Birth: 17-Jun-1932  Subjective/Objective:                    Action/Plan:   Expected Discharge Date: 03/17/15                 Expected Discharge Plan:     In-House Referral:     Discharge planning Services     Post Acute Care Choice:    Choice offered to:     DME Arranged:    DME Agency:     HH Arranged:    [place] Agency:     Status of Service:     Medicare Important Message Given:   yes Date Medicare IM Given:   03/15/15 Medicare IM give by:   Frann Rider, RN, BSN Date Additional Medicare IM Given:    Additional Medicare Important Message give by:     If discussed at [place] of Stay Meetings, dates discussed:    Additional Comments:  Norina Buzzard, RN 03/15/2015, 4:39 PM

## 2015-03-15 NOTE — Discharge Instructions (Signed)
Can get right hip incisions wet daily in the shower, then new dry dressing or large band-aids daily. Up only with assistance; full weight as tolerated. Expect right thigh swelling; ice as needed

## 2015-03-15 NOTE — Progress Notes (Signed)
Initial Nutrition Assessment  DOCUMENTATION CODES:  Not applicable  INTERVENTION:  Boost Breeze po BID, each supplement provides 250 kcal and 9 grams of protein  Ensure Enlive po Daily, each supplement provides 350 kcal and 20 grams of protein   NUTRITION DIAGNOSIS:  Increased nutrient needs related to wound healing as evidenced by estimated requirements for the condtion  GOAL:  Patient will meet greater than or equal to 90% of their needs  MONITOR:  PO intake, Supplement acceptance, Skin, Labs, I & O's  REASON FOR ASSESSMENT:  Consult Hip fracture protocol  ASSESSMENT:  81 y/o with no significant PMhx suffered hip fracture after mechanical fall now POD 1 ORIF R hip  Pt reports that prior to her fall she was eating well and maintaining her weight. She took osteo-bi-flex, a mvi, b12 and a Vitamin D supplement.    She denies any chronic issues w/ n/v/d/c. However, today she states she is very nauseous and did not have much of an appetite. Explained the importance of increased protein to aid in wound healing. She was agreeable to supplements. She would like to try Boost Breeze since that is typically better tolerated by patients experiencing nausea.   NFPE: Only assessed upper body-WDL   Diet Order:  Diet regular Room service appropriate?: Yes; Fluid consistency:: Thin  Skin:  New incision R hip  Last BM:  Unknown  Height:  Ht Readings from Last 1 Encounters:  03/14/15 5\' 2"  (1.575 m)   Weight:  Wt Readings from Last 1 Encounters:  03/14/15 109 lb (49.442 kg)   Ideal Body Weight:  50 kg  BMI:  Body mass index is 19.93 kg/(m^2).  Estimated Nutritional Needs:  Kcal:  1500-1700 (30-34 kcal/kg) Protein:  60-70 g Pro (1.2-1.4 g/kg bw) Fluid:  1.5-1.7 liters fluid  EDUCATION NEEDS:  No education needs identified at this time  Burtis Junes RD, LDN Nutrition Pager: [phone] 03/15/2015 12:01 PM

## 2015-03-15 NOTE — Clinical Social Work Note (Signed)
Clinical Social Work Assessment  Patient Details  Name: Michaela Simpson MRN: [id number] Date of Birth: 1931/08/05  Date of referral:  03/15/15               Reason for consult:  Discharge Planning                Permission sought to share information with:  Family Supports Permission granted to share information::  Yes, Verbal Permission Granted  Name::     [name]  Agency::     Relationship::  daughter  Contact Information:  [phone]  Housing/Transportation Living arrangements for the past 2 months:  [place] of Information:  Patient, Adult Children Patient Interpreter Needed:  None Criminal Activity/Legal Involvement Pertinent to Current Situation/Hospitalization:  No - Comment as needed Significant Relationships:  Adult Children Lives with:  Self Do you feel safe going back to the place where you live?  No Need for family participation in patient care:  Yes (Comment) (pt family at bedside)  Care giving concerns:  Pt admitted from home alone. Pt with right hip fracture and will require rehab at SNF prior to return home.   Social Worker assessment / plan:  CSW received referral for new SNF.  CSW met with pt, pt daughter, and pt son-in-law at bedside. CSW introduced self and explained role. Pt reports that she lives alone at home. CSW discussed recommendation for short term rehab and pt and pt daughter agreeable. Pt agreeable to [facility] search. Pt states that her preference would be [facility] as pt lives in [place]. CSW clarified pt and pt daughters questions.  CSW completed FL2 and initiated SNF search to [facility].   CSW to follow up with pt and pt daughter re: SNF bed offers.   CSW to continue to follow to provide support and assist with pt disposition needs.  Employment status:  Retired Forensic scientist:  Medicare PT Recommendations:  [place] / Referral to community  resources:  [place]  Patient/Family's Response to care:  Pt alert and oriented x 4. Pt pleasant and actively involved in assessment. Pt recognizes that she will need short term rehab prior to returning home. Pt preference is [facility].  Patient/Family's Understanding of and Emotional Response to Diagnosis, Current Treatment, and Prognosis:  Pt displayed knowledge about her injury and surgery. Pt aware that rehab at SNF will be needed. Pt eager to get to rehab and hopeful that she will not have to remain in the hospital for many more days.   Emotional Assessment Appearance:  Appears stated age Attitude/Demeanor/Rapport:  Other (pt appropriate) Affect (typically observed):  Pleasant Orientation:  Oriented to Self, Oriented to Place, Oriented to  Time, Oriented to Situation Alcohol / Substance use:  Not Applicable Psych involvement (Current and /or in the community):  No (Comment)  Discharge Needs  Concerns to be addressed:  Discharge Planning Concerns Readmission within the last 30 days:  No Current discharge risk:  None Barriers to Discharge:  No Barriers Identified   KIDD, [place], LCSW 03/15/2015, 1:59 PM  Weekend coverage [phone]

## 2015-03-15 NOTE — Clinical Social Work Placement (Signed)
   CLINICAL SOCIAL WORK PLACEMENT  NOTE  Date:  03/15/2015  Patient Details  Name: Michaela Simpson MRN: [id number] Date of Birth: [birth year]  Clinical Social Work is seeking post-discharge placement for this patient at the [place] level of care (*CSW will initial, date and re-position this form in  chart as items are completed):  Yes   Patient/family provided with [place] [facility] [place] Work Department's list of facilities offering this level of care within the geographic area requested by the patient (or if unable, by the patient's family).  Yes   Patient/family informed of their freedom to choose among providers that offer the needed level of care, that participate in Medicare, Medicaid or managed care program needed by the patient, have an available bed and are willing to accept the patient.  Yes   Patient/family informed of [place]'s ownership interest in [facility] and [facility], as well as of the fact that they are under no obligation to receive care at these facilities.  PASRR submitted to EDS on 03/15/15     PASRR number received on 03/15/15     Existing PASRR number confirmed on       FL2 transmitted to all facilities in geographic area requested by pt/family on 03/15/15     FL2 transmitted to all facilities within larger geographic area on       Patient informed that his/her managed care company has contracts with or will negotiate with certain facilities, including the following:            Patient/family informed of bed offers received.  Patient chooses bed at       Physician recommends and patient chooses bed at      Patient to be transferred to   on  .  Patient to be transferred to facility by       Patient family notified on   of transfer.  Name of family member notified:        PHYSICIAN Please sign FL2     Additional Comment:    _______________________________________________ Ladell Pier,  LCSW 03/15/2015, 2:14 PM

## 2015-03-16 MED ORDER — FAMOTIDINE IN NACL 20-0.9 MG/50ML-% IV SOLN
20.0000 mg | Freq: Two times a day (BID) | INTRAVENOUS | Status: DC
  Filled 2015-03-16 (×2): qty 50

## 2015-03-16 MED ORDER — SODIUM CHLORIDE 0.9 % IV BOLUS (SEPSIS)
500.0000 mL | Freq: Once | INTRAVENOUS | Status: AC
  Administered 2015-03-16: 500 mL via INTRAVENOUS

## 2015-03-16 MED ORDER — SODIUM CHLORIDE 0.9 % IV SOLN
INTRAVENOUS | Status: DC
  Administered 2015-03-16: 13:00:00 via INTRAVENOUS

## 2015-03-16 MED ORDER — PANTOPRAZOLE SODIUM 40 MG PO TBEC
40.0000 mg | DELAYED_RELEASE_TABLET | Freq: Every day | ORAL | Status: DC
  Administered 2015-03-16 – 2015-03-17 (×2): 40 mg via ORAL
  Filled 2015-03-16 (×2): qty 1

## 2015-03-16 MED ORDER — PANTOPRAZOLE SODIUM 40 MG IV SOLR: 40.0000 mg | INTRAVENOUS | Status: DC

## 2015-03-16 MED ORDER — KETOROLAC TROMETHAMINE 15 MG/ML IJ SOLN
15.0000 mg | Freq: Four times a day (QID) | INTRAMUSCULAR | Status: DC | PRN
  Administered 2015-03-16 – 2015-03-17 (×3): 15 mg via INTRAVENOUS
  Filled 2015-03-16 (×3): qty 1

## 2015-03-16 MED ORDER — TRAMADOL HCL 50 MG PO TABS: 50.0000 mg | ORAL_TABLET | Freq: Four times a day (QID) | ORAL | Status: DC | PRN

## 2015-03-16 NOTE — Discharge Summary (Signed)
Physician Discharge Summary  Michaela Simpson YQM:[id number] DOB: [birth year] DOA: 03/14/2015  PCP: Wende Neighbors, MD  Admit date: 03/14/2015 Discharge date: 03/17/2015  Time spent: 55 minutes  Recommendations for Outpatient Follow-up:  1. Discharged to skilled nursing facility   Discharge Condition: Stable  Discharge Diagnoses:  Principal Problem:   Closed right hip fracture Active Problems:   Rheumatoid arthritis   Fall  Acute rena failure  History of present illness:  Michaela Simpson is a 81 y.o. female with no significant past medical history who presented to the ER after a mechanical fall and was found to have a right hip fracture.   Hospital Course:  Principal Problem:  Closed right hip fracture - s/p OR/ IF  - management per ortho - PT eval completed- SNF recommended - DVT prophylaxis-  ASA 325 daily   Nausea - PRN Zofran -as she has h/o vomiting from Codeine I d/c'd  Morphine and Vicodin with resulted in resolution of nausea  Acute Renal failure - prerenal- corrected with IVF  Procedures:  9/23-right hip ORIF  Consultations:  Orthopedic surgery-Dr. Ninfa Linden  Discharge Exam: Filed Weights   03/14/15 1314  Weight: 49.442 kg (109 lb)   Filed Vitals:   03/17/15 0623  BP: 120/48  Pulse: 97  Temp: 98.3 F (36.8 C)  Resp: 16    General: AAO x 3, no distress Cardiovascular: RRR, no murmurs  Respiratory: clear to auscultation bilaterally GI: soft, non-tender, non-distended, bowel sound positive  Discharge Instructions You were cared for by a hospitalist during your hospital stay. If you have any questions about your discharge medications or the care you received while you were in the hospital after you are discharged, you can call the unit and asked to speak with the hospitalist on call if the hospitalist that took care of you is not available. Once you are discharged, your primary care physician will handle any further medical issues. Please  note that NO REFILLS for any discharge medications will be authorized once you are discharged, as it is imperative that you return to your primary care physician (or establish a relationship with a primary care physician if you do not have one) for your aftercare needs so that they can reassess your need for medications and monitor your lab values.      Discharge Instructions    Full weight bearing    Complete by:  As directed      Increase activity slowly    Complete by:  As directed             Medication List    STOP taking these medications        ALEVE 220 MG tablet  Generic drug:  naproxen sodium      TAKE these medications        acetaminophen 325 MG tablet  Commonly known as:  TYLENOL  Take 2 tablets (650 mg total) by mouth every 6 (six) hours as needed for mild pain (or Fever >/= 101).     aspirin EC 325 MG tablet  Take 1 tablet (325 mg total) by mouth daily.     fluticasone 0.05 % cream  Commonly known as:  CUTIVATE  Apply 1 application topically daily as needed.     ibuprofen 200 MG tablet  Commonly known as:  MOTRIN IB  Take 2-3 tablets (400-600 mg total) by mouth every 6 (six) hours as needed for fever, headache, mild pain or moderate pain.     multivitamin with minerals  tablet  Take 1 tablet by mouth daily.     OSTEO BI-FLEX REGULAR STRENGTH PO  Take 1 tablet by mouth 2 (two) times daily.     sertraline 50 MG tablet  Commonly known as:  ZOLOFT  Take 50 mg by mouth daily.       Allergies  Allergen Reactions  . Penicillins Other (See Comments)    Child hood allergy  . Codeine Nausea And Vomiting and Rash   Follow-up Information    Follow up with Mcarthur Rossetti, MD. Schedule an appointment as soon as possible for a visit in 2 weeks.   Specialty:  Orthopedic Surgery   Contact information:   [place] [address] [phone]        The results of significant diagnostics from this hospitalization (including  imaging, microbiology, ancillary and laboratory) are listed below for reference.    Significant Diagnostic Studies: Dg Pelvis Portable  03/14/2015   CLINICAL DATA:  She was leaning against an elevator door that opened and she fell backwards landing on her rt hip. Severe pain rt hip.  EXAM: PORTABLE PELVIS 1-2 VIEWS  COMPARISON:  08/06/2010  FINDINGS: There is a comminuted intertrochanteric fracture of the right hip, associated with varus angulation. No dislocation. The pelvis and left hip appear intact.  IMPRESSION: Intertrochanteric fracture right hip.   Electronically Signed   By: Nolon Nations M.D.   On: 03/14/2015 13:49   Dg Chest Portable 1 View  03/14/2015   CLINICAL DATA:  [place] today.  Right hip fracture.  EXAM: PORTABLE CHEST 1 VIEW  COMPARISON:  None.  FINDINGS: Cardiac silhouette is normal in size. Aorta is mildly tortuous. No mediastinal or hilar masses or evidence of adenopathy.  Clear lungs.  No pleural effusion or pneumothorax.  Bony thorax is demineralized but grossly intact.  IMPRESSION: No acute cardiopulmonary disease.   Electronically Signed   By: Lajean Manes M.D.   On: 03/14/2015 13:50   Dg C-arm 1-60 Min  03/14/2015   CLINICAL DATA:  Patient status post ORIF proximal right femur.  EXAM: LEFT FEMUR 2 VIEWS; DG C-ARM 61-120 MIN  COMPARISON:  03/14/2015 pelvic radiograph.  FINDINGS: Patient status post intra medullary rod and screw fixation of proximal right femur fracture. Four fluoroscopic intraoperative images were submitted for interpretation. Osseous fragment is demonstrated on the oblique view anterior to femoral neck.  IMPRESSION: Patient status post ORIF proximal right femur fracture.   Electronically Signed   By: Lovey Newcomer M.D.   On: 03/14/2015 22:33   Dg Femur Min 2 Views Left  03/14/2015   CLINICAL DATA:  Patient status post ORIF proximal right femur.  EXAM: LEFT FEMUR 2 VIEWS; DG C-ARM 61-120 MIN  COMPARISON:  03/14/2015 pelvic radiograph.  FINDINGS: Patient status  post intra medullary rod and screw fixation of proximal right femur fracture. Four fluoroscopic intraoperative images were submitted for interpretation. Osseous fragment is demonstrated on the oblique view anterior to femoral neck.  IMPRESSION: Patient status post ORIF proximal right femur fracture.   Electronically Signed   By: Lovey Newcomer M.D.   On: 03/14/2015 22:33    Microbiology: Recent Results (from the past 240 hour(s))  Surgical pcr screen     Status: None   Collection Time: 03/14/15  8:51 PM  Result Value Ref Range Status   MRSA, PCR NEGATIVE NEGATIVE Final   Staphylococcus aureus NEGATIVE NEGATIVE Final    Comment:        The Xpert SA Assay (FDA  approved for NASAL specimens in patients over 82 years of age), is one component of a comprehensive surveillance program.  Test performance has been validated by [facility] for patients greater than or equal to 42 year old. It is not intended to diagnose infection nor to guide or monitor treatment.      Labs: Basic Metabolic Panel:  Recent Labs Lab 03/14/15 1419 03/14/15 2022 03/15/15 0535 03/17/15 0440  NA 139  --  139 143  K 3.7  --  4.4 4.0  CL 105  --  108 114*  CO2 22  --  23 25  GLUCOSE 103*  --  155* 95  BUN 28*  --  23* 20  CREATININE 1.08* 1.07* 1.18* 0.96  CALCIUM 9.6  --  8.6* 8.2*   Liver Function Tests:  Recent Labs Lab 03/14/15 1419  AST 27  ALT 13*  ALKPHOS 70  BILITOT 0.6  PROT 7.1  ALBUMIN 4.4   No results for input(s): LIPASE, AMYLASE in the last 168 hours. No results for input(s): AMMONIA in the last 168 hours. CBC:  Recent Labs Lab 03/14/15 1419 03/14/15 2022 03/15/15 0535  WBC 9.7 11.8* 10.4  NEUTROABS 7.1  --   --   HGB 14.1 12.1 10.1*  HCT 42.6 37.0 30.7*  MCV 92.4 91.6 93.3  PLT 268 235 228   Cardiac Enzymes: No results for input(s): CKTOTAL, CKMB, CKMBINDEX, TROPONINI in the last 168 hours. BNP: BNP (last 3 results) No results for input(s): BNP in the last 8760  hours.  ProBNP (last 3 results) No results for input(s): PROBNP in the last 8760 hours.  CBG: No results for input(s): GLUCAP in the last 168 hours.     SignedDebbe Odea, MD [facility] 03/17/2015, 8:58 AM

## 2015-03-16 NOTE — Progress Notes (Signed)
Subjective: 2 Days Post-Op Procedure(s) (LRB): RIGHT INTERTROCHANTRIC INTRAMEDULLARY (IM) NAIL  (Right) Patient reports pain as moderate.  Awake and alert sitting in bedside chair.  Asymptomatic acute blood loss anemia.  Vitals stable.  Did get up with therapy yesterday.  Objective: Vital signs in last 24 hours: Temp:  [98.4 F (36.9 C)-98.9 F (37.2 C)] 98.9 F (37.2 C) (09/25 0507) Pulse Rate:  [83-87] 87 (09/25 0507) Resp:  [17] 17 (09/25 0507) BP: (122-124)/(41-50) 122/41 mmHg (09/25 0507) SpO2:  [94 %-97 %] 94 % (09/25 0507)  Intake/Output from previous day: 09/24 0701 - 09/25 0700 In: 120 [P.O.:120] Out: -  Intake/Output this shift:     Recent Labs  03/14/15 1419 03/14/15 2022 03/15/15 0535  HGB 14.1 12.1 10.1*    Recent Labs  03/14/15 2022 03/15/15 0535  WBC 11.8* 10.4  RBC 4.04 3.29*  HCT 37.0 30.7*  PLT 235 228    Recent Labs  03/14/15 1419 03/14/15 2022 03/15/15 0535  NA 139  --  139  K 3.7  --  4.4  CL 105  --  108  CO2 22  --  23  BUN 28*  --  23*  CREATININE 1.08* 1.07* 1.18*  GLUCOSE 103*  --  155*  CALCIUM 9.6  --  8.6*    Recent Labs  03/14/15 1419  INR 0.99    Sensation intact distally Intact pulses distally Dorsiflexion/Plantar flexion intact Incision: scant drainage  Assessment/Plan: 2 Days Post-Op Procedure(s) (LRB): RIGHT INTERTROCHANTRIC INTRAMEDULLARY (IM) NAIL  (Right) Up with therapy Discharge to SNF likely next day or so.  BLACKMAN,CHRISTOPHER Y 03/16/2015, 7:19 AM

## 2015-03-16 NOTE — Progress Notes (Signed)
Physical Therapy Treatment Patient Details Name: Michaela Simpson MRN: [id number] DOB: 11/18/1931 Today's Date: 03/16/2015    History of Present Illness Patient is an 81 yo widowed female [place] and sustained Rt intertrochanteric fx.  s/p ORIF, nail and screw fixation on 9/23    PT Comments    Pt agreeable to participate in PT session but cont's to be limited by nausea & pain.  Progressing very slowly.      Follow Up Recommendations  SNF;Supervision for mobility/OOB     Equipment Recommendations  Rolling walker with 5" wheels;3in1 (PT)    Recommendations for Other Services       Precautions / Restrictions Precautions Precautions: Fall Restrictions RLE Weight Bearing: Weight bearing as tolerated    Mobility  Bed Mobility               General bed mobility comments: pt sitting in recliner before/after session  Transfers Overall transfer level: Needs assistance Equipment used: Rolling walker (2 wheeled) Transfers: Sit to/from Stand Sit to Stand: Mod assist         General transfer comment: cues for hand placement & technique.    Ambulation/Gait Ambulation/Gait assistance: Min assist;+2 safety/equipment Ambulation Distance (Feet):  (4 steps forwards) Assistive device: Rolling walker (2 wheeled) Gait Pattern/deviations: Step-to pattern;Decreased step length - right;Decreased step length - left;Antalgic;Decreased weight shift to right   Gait velocity interpretation: Below normal speed for age/gender General Gait Details: Gait cont's to be limited by nausea & pain.  Did not physically get sick but pt states she's unable to cont with session   Stairs            Wheelchair Mobility    Modified Rankin (Stroke Patients Only)       Balance                                    Cognition Arousal/Alertness: Awake/alert Behavior During Therapy: WFL for tasks assessed/performed Overall Cognitive Status: Within Functional  Limits for tasks assessed                      Exercises General Exercises - Lower Extremity Ankle Circles/Pumps: AROM;Both;10 reps Long Arc Quad: AROM;Right;10 reps Hip Flexion/Marching: AAROM;Right;10 reps;Seated    General Comments        Pertinent Vitals/Pain Pain Assessment: 0-10 Pain Score: 5  Pain Location: Rt hip Pain Descriptors / Indicators: Sore;Aching Pain Intervention(s): Limited activity within patient's tolerance;Monitored during session;Repositioned;Premedicated before session    Home Living                      Prior Function            PT Goals (current goals can now be found in the care plan section) Acute Rehab PT Goals Patient Stated Goal: get stronger before returning home. I want to [facility] Rehab PT Goal Formulation: With patient/family Time For Goal Achievement: 03/29/15 Potential to Achieve Goals: Good Progress towards PT goals: Progressing toward goals (slowly)    Frequency  Min 5X/week    PT Plan Current plan remains appropriate    Co-evaluation             End of Session   Activity Tolerance: Patient limited by pain;Other (comment) (nausea) Patient left: in chair;with call bell/phone within reach     Time: 7078-6754 PT Time Calculation (min) (ACUTE ONLY): 16 min  Charges:  $Gait Training: 8-22 mins                    G Codes:      Sena Hitch 03/16/2015, 12:30 PM   Sarajane Marek, [place] [phone] 03/16/2015

## 2015-03-16 NOTE — Care Management Note (Signed)
Case Management Note  Patient Details  Name: LONI DELBRIDGE MRN: [id number] Date of Birth: 14-Feb-1932  Subjective/Objective:                  RIGHT INTERTROCHANTRIC INTRAMEDULLARY (IM) NAIL (Right)  Action/Plan: Plan is for patient to discharge to SNF when medically ready. Sw consulted for placement. CM remains available for any further discharge planning needs.   Expected Discharge Date:                  Expected Discharge Plan:  Skilled Nursing Facility  In-House Referral:  Clinical Social Work  Discharge planning Services  CM Consult  Post Acute Care Choice:    Choice offered to:     DME Arranged:    DME Agency:     HH Arranged:    [place] Agency:     Status of Service:  In process, will continue to follow  Medicare Important Message Given:    Date Medicare IM Given:    Medicare IM give by:    Date Additional Medicare IM Given:    Additional Medicare Important Message give by:     If discussed at [place] of Stay Meetings, dates discussed:    Additional Comments:  Guido Sander, RN 03/16/2015, 11:13 AM

## 2015-03-16 NOTE — Progress Notes (Signed)
[facility] Progress Note   VANNARY GREENING  OVF:[id number]  DOB: 1931-12-27  DOA: 03/14/2015 PCP: No primary care provider on file.  Brief narrative: ZAKYRIA METZINGER is a 81 y.o. female admitted with a mechanical fall and found to have a right hip fracture.   Subjective: Continues to be nauseated. No vomiting, abdominal pain, constipation or diarrhea.   Assessment/Plan: Principal Problem:   Closed right hip fracture - s/p OR/ IF   - management per ortho - PT eval, ASA 325 daily for DVT prophylaxis  Nausea - PRN Zofran -has h/o vomiting from Codeine- d/c Morphine and Vicodin- use Toradol for today - start Protonix  Renal failure - unable to tell if acute or chronic - as she is nauseated and PO intake is poor, will start slow hydration in case this is prerenal    Code Status:     Code Status Orders        Start     Ordered   03/14/15 2258  Full code   Continuous     03/14/15 2257    Advance Directive Documentation        Most Recent Value   Type of Advance Directive  Healthcare Power of [place], Living will   Pre-existing out of facility DNR order (yellow form or pink MOST form)     "MOST" Form in Place?       Disposition Plan: SNF tomorrow Disposition Plan: awaial DVT prophylaxis: heparin Consultants:ortho Procedures:ORIF  Antibiotics: Anti-infectives    Start     Dose/Rate Route Frequency Ordered Stop   03/15/15 0600  ceFAZolin (ANCEF) IVPB 1 g/50 mL premix     1 g 100 mL/hr over 30 Minutes Intravenous 3 times per day 03/14/15 2257 03/15/15 1459      Objective: Filed Weights   03/14/15 1314  Weight: 49.442 kg (109 lb)    Intake/Output Summary (Last 24 hours) at 03/16/15 1248 Last data filed at 03/16/15 0900  Gross per 24 hour  Intake    240 ml  Output      0 ml  Net    240 ml     Vitals Filed Vitals:   03/14/15 2254 03/15/15 0600 03/15/15 2008 03/16/15 0507  BP: 146/59 110/45 124/50 122/41  Pulse: 91 86 83 87   Temp: 97.8 F (36.6 C) 97.5 F (36.4 C) 98.4 F (36.9 C) 98.9 F (37.2 C)  TempSrc:   Oral Oral  Resp: 16 16 17 17   Height:      Weight:      SpO2: 93% 100% 97% 94%    Exam:  General:  Pt is alert, not in acute distress  HEENT: No icterus, No thrush, oral mucosa moist  Cardiovascular: regular rate and rhythm, S1/S2 No murmur  Respiratory: clear to auscultation bilaterally   Abdomen: Soft, +Bowel sounds, non tender, non distended, no guarding  MSK: No cyanosis or clubbing or pedal edema  Data Reviewed: Basic Metabolic Panel:  Recent Labs Lab 03/14/15 1419 03/14/15 2022 03/15/15 0535  NA 139  --  139  K 3.7  --  4.4  CL 105  --  108  CO2 22  --  23  GLUCOSE 103*  --  155*  BUN 28*  --  23*  CREATININE 1.08* 1.07* 1.18*  CALCIUM 9.6  --  8.6*   Liver Function Tests:  Recent Labs Lab 03/14/15 1419  AST 27  ALT 13*  ALKPHOS 70  BILITOT 0.6  PROT 7.1  ALBUMIN 4.4  No results for input(s): LIPASE, AMYLASE in the last 168 hours. No results for input(s): AMMONIA in the last 168 hours. CBC:  Recent Labs Lab 03/14/15 1419 03/14/15 2022 03/15/15 0535  WBC 9.7 11.8* 10.4  NEUTROABS 7.1  --   --   HGB 14.1 12.1 10.1*  HCT 42.6 37.0 30.7*  MCV 92.4 91.6 93.3  PLT 268 235 228   Cardiac Enzymes: No results for input(s): CKTOTAL, CKMB, CKMBINDEX, TROPONINI in the last 168 hours. BNP (last 3 results) No results for input(s): BNP in the last 8760 hours.  ProBNP (last 3 results) No results for input(s): PROBNP in the last 8760 hours.  CBG: No results for input(s): GLUCAP in the last 168 hours.  Recent Results (from the past 240 hour(s))  Surgical pcr screen     Status: None   Collection Time: 03/14/15  8:51 PM  Result Value Ref Range Status   MRSA, PCR NEGATIVE NEGATIVE Final   Staphylococcus aureus NEGATIVE NEGATIVE Final    Comment:        The Xpert SA Assay (FDA approved for NASAL specimens in patients over 44 years of age), is one  component of a comprehensive surveillance program.  Test performance has been validated by [facility] for patients greater than or equal to 15 year old. It is not intended to diagnose infection nor to guide or monitor treatment.      Studies: Dg Pelvis Portable  03/14/2015   CLINICAL DATA:  She was leaning against an elevator door that opened and she fell backwards landing on her rt hip. Severe pain rt hip.  EXAM: PORTABLE PELVIS 1-2 VIEWS  COMPARISON:  08/06/2010  FINDINGS: There is a comminuted intertrochanteric fracture of the right hip, associated with varus angulation. No dislocation. The pelvis and left hip appear intact.  IMPRESSION: Intertrochanteric fracture right hip.   Electronically Signed   By: Nolon Nations M.D.   On: 03/14/2015 13:49   Dg Chest Portable 1 View  03/14/2015   CLINICAL DATA:  [place] today.  Right hip fracture.  EXAM: PORTABLE CHEST 1 VIEW  COMPARISON:  None.  FINDINGS: Cardiac silhouette is normal in size. Aorta is mildly tortuous. No mediastinal or hilar masses or evidence of adenopathy.  Clear lungs.  No pleural effusion or pneumothorax.  Bony thorax is demineralized but grossly intact.  IMPRESSION: No acute cardiopulmonary disease.   Electronically Signed   By: Lajean Manes M.D.   On: 03/14/2015 13:50   Dg C-arm 1-60 Min  03/14/2015   CLINICAL DATA:  Patient status post ORIF proximal right femur.  EXAM: LEFT FEMUR 2 VIEWS; DG C-ARM 61-120 MIN  COMPARISON:  03/14/2015 pelvic radiograph.  FINDINGS: Patient status post intra medullary rod and screw fixation of proximal right femur fracture. Four fluoroscopic intraoperative images were submitted for interpretation. Osseous fragment is demonstrated on the oblique view anterior to femoral neck.  IMPRESSION: Patient status post ORIF proximal right femur fracture.   Electronically Signed   By: Lovey Newcomer M.D.   On: 03/14/2015 22:33   Dg Femur Min 2 Views Left  03/14/2015   CLINICAL DATA:  Patient status post ORIF  proximal right femur.  EXAM: LEFT FEMUR 2 VIEWS; DG C-ARM 61-120 MIN  COMPARISON:  03/14/2015 pelvic radiograph.  FINDINGS: Patient status post intra medullary rod and screw fixation of proximal right femur fracture. Four fluoroscopic intraoperative images were submitted for interpretation. Osseous fragment is demonstrated on the oblique view anterior to femoral neck.  IMPRESSION: Patient status  post ORIF proximal right femur fracture.   Electronically Signed   By: Lovey Newcomer M.D.   On: 03/14/2015 22:33    Scheduled Meds:  Scheduled Meds: . aspirin EC  325 mg Oral Q breakfast  . feeding supplement  1 Container Oral BID BM  . feeding supplement (ENSURE ENLIVE)  237 mL Oral Q24H  . heparin  5,000 Units Subcutaneous 3 times per day  . pantoprazole  40 mg Oral Daily  . sertraline  50 mg Oral Daily   Continuous Infusions:    Time spent on care of this patient: 57 min   [place], MD 03/16/2015, 12:48 PM  LOS: 2 days   [facility] Office  [phone] Pager - Text Page per [email or web address] If 7PM-7AM, please contact night-coverage [email or web address]

## 2015-03-17 ENCOUNTER — Encounter (HOSPITAL_COMMUNITY): Payer: Self-pay | Admitting: Orthopaedic Surgery

## 2015-03-17 ENCOUNTER — Inpatient Hospital Stay
Admission: RE | Admit: 2015-03-17 | Discharge: 2015-03-22 | Disposition: A | Discharge status: Other Acute Inpatient Hospital | Payer: Private Health Insurance - Indemnity | Source: Ambulatory Visit | Attending: Internal Medicine | Admitting: Internal Medicine

## 2015-03-17 DIAGNOSIS — Z9181 History of falling: Secondary | ICD-10-CM | POA: Diagnosis not present

## 2015-03-17 DIAGNOSIS — Y998 Other external cause status: Secondary | ICD-10-CM | POA: Diagnosis not present

## 2015-03-17 DIAGNOSIS — R011 Cardiac murmur, unspecified: Secondary | ICD-10-CM | POA: Diagnosis not present

## 2015-03-17 DIAGNOSIS — S72141D Displaced intertrochanteric fracture of right femur, subsequent encounter for closed fracture with routine healing: Secondary | ICD-10-CM | POA: Diagnosis not present

## 2015-03-17 DIAGNOSIS — M21251 Flexion deformity, right hip: Secondary | ICD-10-CM | POA: Diagnosis not present

## 2015-03-17 DIAGNOSIS — R262 Difficulty in walking, not elsewhere classified: Secondary | ICD-10-CM | POA: Diagnosis not present

## 2015-03-17 DIAGNOSIS — Z885 Allergy status to narcotic agent status: Secondary | ICD-10-CM | POA: Diagnosis not present

## 2015-03-17 DIAGNOSIS — N179 Acute kidney failure, unspecified: Secondary | ICD-10-CM | POA: Insufficient documentation

## 2015-03-17 DIAGNOSIS — H3552 Pigmentary retinal dystrophy: Secondary | ICD-10-CM | POA: Diagnosis not present

## 2015-03-17 DIAGNOSIS — R609 Edema, unspecified: Secondary | ICD-10-CM | POA: Diagnosis not present

## 2015-03-17 DIAGNOSIS — I350 Nonrheumatic aortic (valve) stenosis: Secondary | ICD-10-CM | POA: Diagnosis not present

## 2015-03-17 DIAGNOSIS — Y9389 Activity, other specified: Secondary | ICD-10-CM | POA: Diagnosis not present

## 2015-03-17 DIAGNOSIS — M069 Rheumatoid arthritis, unspecified: Secondary | ICD-10-CM | POA: Diagnosis not present

## 2015-03-17 DIAGNOSIS — S72141A Displaced intertrochanteric fracture of right femur, initial encounter for closed fracture: Secondary | ICD-10-CM | POA: Diagnosis not present

## 2015-03-17 DIAGNOSIS — X58XXXD Exposure to other specified factors, subsequent encounter: Secondary | ICD-10-CM | POA: Diagnosis not present

## 2015-03-17 DIAGNOSIS — Z66 Do not resuscitate: Secondary | ICD-10-CM | POA: Diagnosis not present

## 2015-03-17 DIAGNOSIS — R52 Pain, unspecified: Secondary | ICD-10-CM

## 2015-03-17 DIAGNOSIS — Z880 Allergy status to penicillin: Secondary | ICD-10-CM | POA: Diagnosis not present

## 2015-03-17 DIAGNOSIS — D62 Acute posthemorrhagic anemia: Secondary | ICD-10-CM | POA: Diagnosis not present

## 2015-03-17 DIAGNOSIS — Y9289 Other specified places as the place of occurrence of the external cause: Secondary | ICD-10-CM | POA: Diagnosis not present

## 2015-03-17 DIAGNOSIS — Z9981 Dependence on supplemental oxygen: Secondary | ICD-10-CM | POA: Diagnosis not present

## 2015-03-17 DIAGNOSIS — S72001D Fracture of unspecified part of neck of right femur, subsequent encounter for closed fracture with routine healing: Secondary | ICD-10-CM | POA: Diagnosis not present

## 2015-03-17 DIAGNOSIS — E876 Hypokalemia: Secondary | ICD-10-CM | POA: Diagnosis not present

## 2015-03-17 DIAGNOSIS — S72001S Fracture of unspecified part of neck of right femur, sequela: Secondary | ICD-10-CM | POA: Diagnosis not present

## 2015-03-17 DIAGNOSIS — M810 Age-related osteoporosis without current pathological fracture: Secondary | ICD-10-CM | POA: Diagnosis not present

## 2015-03-17 DIAGNOSIS — Z4789 Encounter for other orthopedic aftercare: Secondary | ICD-10-CM | POA: Diagnosis not present

## 2015-03-17 DIAGNOSIS — R278 Other lack of coordination: Secondary | ICD-10-CM | POA: Diagnosis not present

## 2015-03-17 DIAGNOSIS — M6281 Muscle weakness (generalized): Secondary | ICD-10-CM | POA: Diagnosis not present

## 2015-03-17 DIAGNOSIS — Z7982 Long term (current) use of aspirin: Secondary | ICD-10-CM | POA: Diagnosis not present

## 2015-03-17 DIAGNOSIS — W19XXXS Unspecified fall, sequela: Secondary | ICD-10-CM | POA: Diagnosis not present

## 2015-03-17 DIAGNOSIS — W19XXXA Unspecified fall, initial encounter: Secondary | ICD-10-CM | POA: Diagnosis not present

## 2015-03-17 LAB — BASIC METABOLIC PANEL
Anion gap: 4 — ABNORMAL LOW (ref 5–15)
BUN: 20 mg/dL (ref 6–20)
CALCIUM: 8.2 mg/dL — AB (ref 8.9–10.3)
CHLORIDE: 114 mmol/L — AB (ref 101–111)
CO2: 25 mmol/L (ref 22–32)
CREATININE: 0.96 mg/dL (ref 0.44–1.00)
GFR calc non Af Amer: 54 mL/min — ABNORMAL LOW (ref >60)
Glucose, Bld: 95 mg/dL (ref 65–99)
Potassium: 4 mmol/L (ref 3.5–5.1)
SODIUM: 143 mmol/L (ref 135–145)

## 2015-03-17 MED ORDER — IBUPROFEN 200 MG PO TABS: 400.0000 mg | ORAL_TABLET | Freq: Four times a day (QID) | ORAL | Status: DC | PRN

## 2015-03-17 MED ORDER — ACETAMINOPHEN 325 MG PO TABS: 650.0000 mg | ORAL_TABLET | Freq: Four times a day (QID) | ORAL | Status: DC | PRN

## 2015-03-17 NOTE — Clinical Social Work Placement (Signed)
   CLINICAL SOCIAL WORK PLACEMENT  NOTE  Date:  03/17/2015  Patient Details  Name: Michaela Simpson MRN: [id number] Date of Birth: [birth year]  Clinical Social Work is seeking post-discharge placement for this patient at the [place] level of care (*CSW will initial, date and re-position this form in  chart as items are completed):  Yes   Patient/family provided with [facility] Work Department's list of facilities offering this level of care within the geographic area requested by the patient (or if unable, by the patient's family).  Yes   Patient/family informed of their freedom to choose among providers that offer the needed level of care, that participate in Medicare, Medicaid or managed care program needed by the patient, have an available bed and are willing to accept the patient.  Yes   Patient/family informed of [facility]'s ownership interest in [facility] and [facility], as well as of the fact that they are under no obligation to receive care at these facilities.  PASRR submitted to EDS on 03/15/15     PASRR number received on 03/15/15     Existing PASRR number confirmed on       FL2 transmitted to all facilities in geographic area requested by pt/family on 03/15/15     FL2 transmitted to all facilities within larger geographic area on       Patient informed that his/her managed care company has contracts with or will negotiate with certain facilities, including the following:        Yes   Patient/family informed of bed offers received.  Patient chooses bed at [facility]     Physician recommends and patient chooses bed at  (n/a)    Patient to be transferred to [facility] on 03/17/15.  Patient to be transferred to facility by [facility]     Patient family notified on 03/17/15 of transfer.  Name of family member notified:  Patient at bedside.     PHYSICIAN       Additional Comment:     _______________________________________________ Caroline Sauger, LCSW 03/17/2015, 11:39 AM

## 2015-03-17 NOTE — Discharge Planning (Signed)
Patient to be discharged to [facility]. Patient updated at bedside.  Facility: [facility] RN report number: [phone] Transportation: EMS   Lubertha Sayres, [place] [phone]) and Surgical [phone])

## 2015-03-17 NOTE — Care Management Important Message (Signed)
Important Message  Patient Details  Name: Michaela Simpson MRN: [id number] Date of Birth: Jun 29, 1931   Medicare Important Message Given:  Yes-second notification given    Delorse Lek 03/17/2015, 3:23 PM

## 2015-03-17 NOTE — Progress Notes (Signed)
Michaela Simpson discharged to SNF per MD order. All questions and concerns answered. Copy of instructions and scripts sent with patient. IV removed. Report given to [name].  Patient escorted by [name]. No distress noted upon discharge.   Nicki Reaper [place] 03/17/2015 2:02 PM

## 2015-03-18 NOTE — Anesthesia Postprocedure Evaluation (Signed)
Anesthesia Post Note  Patient: Michaela Simpson  Procedure(s) Performed: Procedure(s) (LRB): RIGHT INTERTROCHANTRIC INTRAMEDULLARY (IM) NAIL  (Right)  Anesthesia type: General  Patient location: PACU  Post pain: Pain level controlled and Adequate analgesia  Post assessment: Post-op Vital signs reviewed, Patient's Cardiovascular Status Stable, Respiratory Function Stable, Patent Airway and Pain level controlled  Last Vitals:  Filed Vitals:   03/17/15 0623  BP: 120/48  Pulse: 97  Temp: 36.8 C  Resp: 16    Post vital signs: Reviewed and stable  Level of consciousness: awake, alert  and oriented  Complications: No apparent anesthesia complications

## 2015-03-19 ENCOUNTER — Non-Acute Institutional Stay (SKILLED_NURSING_FACILITY): Payer: Medicare Other | Admitting: Internal Medicine

## 2015-03-19 DIAGNOSIS — M069 Rheumatoid arthritis, unspecified: Secondary | ICD-10-CM | POA: Diagnosis not present

## 2015-03-19 DIAGNOSIS — S72141A Displaced intertrochanteric fracture of right femur, initial encounter for closed fracture: Secondary | ICD-10-CM | POA: Diagnosis not present

## 2015-03-19 DIAGNOSIS — N179 Acute kidney failure, unspecified: Secondary | ICD-10-CM

## 2015-03-19 DIAGNOSIS — W19XXXA Unspecified fall, initial encounter: Secondary | ICD-10-CM | POA: Diagnosis not present

## 2015-03-19 NOTE — Progress Notes (Signed)
Patient ID: Michaela Simpson, female   DOB: [birth year], 81 y.o.   MRN: [id number]     Facility; [facility] Chief complaint; admission to SNF post admit to [facility] from 9/23 to 03/17/15  History; this is an 81 year old woman who fell while she was on an elevator. She suffered a right hip fracture. She had an ORIF by Dr. Ninfa Linden. She does not have a history of falling, tells me that she fell a year ago once had a minor soft tissue injury to her left knee and she recovered. She does not use ambulatory assist devices. Postoperatively she had some prerenal issues that were corrected with IV fluid she had a history of vomiting from codeine therefore morphine and Vicodin were discontinued and her nausea resolved.  The patient does have a history of osteoporosis based on a DEXA scan in 2011. Her T score in the femurs was -3.1. At the L-spine varying between -2.1 and 2.4 depending on the level. She had an MRI of the lower back that showed degenerative changes as well as previous compression fractures at L1. The patient has not been an any pharmacologic treatment  BMP Latest Ref Rng 03/17/2015 03/15/2015 03/14/2015  Glucose 65 - 99 mg/dL 95 155(H) -  BUN 6 - 20 mg/dL 20 23(H) -  Creatinine 0.44 - 1.00 mg/dL 0.96 1.18(H) 1.07(H)  Sodium 135 - 145 mmol/L 143 139 -  Potassium 3.5 - 5.1 mmol/L 4.0 4.4 -  Chloride 101 - 111 mmol/L 114(H) 108 -  CO2 22 - 32 mmol/L 25 23 -  Calcium 8.9 - 10.3 mg/dL 8.2(L) 8.6(L) -   CBC Latest Ref Rng 03/15/2015 03/14/2015 03/14/2015  WBC 4.0 - 10.5 K/uL 10.4 11.8(H) 9.7  Hemoglobin 12.0 - 15.0 g/dL 10.1(L) 12.1 14.1  Hematocrit 36.0 - 46.0 % 30.7(L) 37.0 42.6  Platelets 150 - 400 K/uL 228 235 268      Past Medical History  Diagnosis Date  . Osteoporosis   . Rheumatoid arthritis    Past Surgical History  Procedure Laterality Date  . Intramedullary (im) nail intertrochanteric Right 03/14/2015    Procedure: RIGHT INTERTROCHANTRIC INTRAMEDULLARY (IM) NAIL ;  Surgeon:  Mcarthur Rossetti, MD;  Location: [place];  Service: Orthopedics;  Laterality: Right;   Current Outpatient Prescriptions on File Prior to Visit  Medication Sig Dispense Refill  . acetaminophen (TYLENOL) 325 MG tablet Take 2 tablets (650 mg total) by mouth every 6 (six) hours as needed for mild pain (or Fever >/= 101).    Marland Kitchen aspirin EC 325 MG tablet Take 1 tablet (325 mg total) by mouth daily. 30 tablet 0  . fluticasone (CUTIVATE) 0.05 % cream Apply 1 application topically daily as needed.  0  . Glucosamine-Chondroitin (OSTEO BI-FLEX REGULAR STRENGTH PO) Take 1 tablet by mouth 2 (two) times daily.    Marland Kitchen ibuprofen (MOTRIN IB) 200 MG tablet Take 2-3 tablets (400-600 mg total) by mouth every 6 (six) hours as needed for fever, headache, mild pain or moderate pain. 30 tablet 0  . Multiple Vitamins-Minerals (MULTIVITAMIN WITH MINERALS) tablet Take 1 tablet by mouth daily.    . sertraline (ZOLOFT) 50 MG tablet Take 50 mg by mouth daily.  0   Social history; the patient lives in her own townhome in [place]. She has children and grandchildren nearby. Previously independent with ADLs and I ADLs. No ambulatory assist device  reports that she has never smoked. She does not have any smokeless tobacco history on file. She reports that she  does not drink alcohol or use illicit drugs.  Family history; none related   Review of systems; Gen; no weight loss HEENT: no swallowing difficulties Respiratory; no shortness of breath, no cough, Cardiac; no exertional chest pain, no palpitations GI; no abnormal pain, no change in bowel habits GU; no dysuria, no voiding difficulties Musculoskeletal; no joint pain, No  Back pain CNS: No focal weakness. Gait was stable  Physical examination Gen. patient is bright alert and able to give her own history Vitals O2 sat is 98% on room air respirations 16 and unlabored pulse rate 100 HEENT oral exam is normal Respiratory clear entry bilaterally Cardiac; S1 and S2  normal there is a 3/6 midsystolic ejection murmur that radiates to the left carotid. There is no S3 no S4 Abdomen no liver no spleen no tenderness GU bladder is not overtly distended or tender Extremities; right hip surgical incision is well-healed there is a lot of edema in the right thigh with extensive bruising down the medial aspect of the thigh. No doubt the swelling is related to blood loss Neurologic; patient is able to come to a standing position with minimal assistance she does well there is no focal problems.  Impression/plan #1 right hip fracture status post ORIF. DVT prophylaxis is with ASA 325 daily. I generally like patient's to be on a more aggressive regimen and aspirin however the patient is already up and mobilizing. I'll not change this. #2 swelling of the right side no doubt due to blood loss #3 history of rheumatoid arthritis. Patient states she saw Dr. Charlestine Night roughly 12 years ago. Is put on medications including prednisone that she was not able to tolerate. She takes Osteo Bi-Flex at home as well as ibuprofen fortunately her symptoms seem to be well controlled/her disease has not been active #4 osteoporosis. This diagnosis is been previously well established a. I've talked to her about this she will need from pharmacologic therapy. I will start her on calcium. Measured 25-hydroxy vitamin D level #5 prerenal azotemia seems to of corrected in the hospital.

## 2015-03-20 ENCOUNTER — Other Ambulatory Visit: Payer: Self-pay | Admitting: *Deleted

## 2015-03-20 MED ORDER — TRAMADOL HCL 50 MG PO TABS: ORAL_TABLET | ORAL | Status: DC

## 2015-03-20 NOTE — Telephone Encounter (Signed)
[facility] 

## 2015-03-21 ENCOUNTER — Encounter: Payer: Self-pay | Admitting: Internal Medicine

## 2015-03-21 ENCOUNTER — Inpatient Hospital Stay (HOSPITAL_COMMUNITY)
Admit: 2015-03-21 | Discharge: 2015-03-21 | Disposition: A | Discharge status: Home / Self Care | Payer: Medicare Other | Attending: Internal Medicine | Admitting: Internal Medicine

## 2015-03-21 ENCOUNTER — Inpatient Hospital Stay (HOSPITAL_COMMUNITY): Payer: Medicare Other

## 2015-03-21 ENCOUNTER — Non-Acute Institutional Stay (SKILLED_NURSING_FACILITY): Payer: Medicare Other | Admitting: Internal Medicine

## 2015-03-21 DIAGNOSIS — R609 Edema, unspecified: Secondary | ICD-10-CM | POA: Diagnosis not present

## 2015-03-21 DIAGNOSIS — S72001D Fracture of unspecified part of neck of right femur, subsequent encounter for closed fracture with routine healing: Secondary | ICD-10-CM

## 2015-03-21 DIAGNOSIS — D62 Acute posthemorrhagic anemia: Secondary | ICD-10-CM | POA: Diagnosis not present

## 2015-03-21 NOTE — Progress Notes (Signed)
Patient ID: Michaela Simpson, female   DOB: [birth year], 81 y.o.   MRN: [id number]     This is an acute visit.  Level  care skilled.    Facility; [facility]   Chief complaint; acute visit follow-up edema right leg  HPI--; this is an 80 year old woman who fell while she was on an elevator. She suffered a right hip fracture. She had an ORIF by Dr. Ninfa Linden. Marland Kitchen Postoperatively she had some prerenal issues that were corrected with IV fluid she had a history of vomiting from codeine therefore morphine and Vicodin were discontinued and her nausea resolved.  The patient does have a history of osteoporosis based on a DEXA scan in 2011. Her T score in the femurs was -3.1. At the L-spine varying between -2.1 and 2.4 depending on the level. She had an MRI of the lower back that showed degenerative changes as well as previous compression fractures at L1. The patient has not been an any pharmacologic treatment  She has had some increased right lower extremity edema-this is been apparently case since her operation-there is some thought this may be blood loss postop related-last hemoglobin was 10.1 on September 24  Patient does continue to have some edema of her right leg a venous Doppler has been ordered and results are pending-she does not complain of any increased pain any shortness of breath or dizziness or syncopal-type feelings she appears to be stable she is working with therapy  It appears she did have some postop blood loss and did require a transfusion  BMP Latest Ref Rng 03/17/2015 03/15/2015 03/14/2015  Glucose 65 - 99 mg/dL 95 155(H) -  BUN 6 - 20 mg/dL 20 23(H) -  Creatinine 0.44 - 1.00 mg/dL 0.96 1.18(H) 1.07(H)  Sodium 135 - 145 mmol/L 143 139 -  Potassium 3.5 - 5.1 mmol/L 4.0 4.4 -  Chloride 101 - 111 mmol/L 114(H) 108 -  CO2 22 - 32 mmol/L 25 23 -  Calcium 8.9 - 10.3 mg/dL 8.2(L) 8.6(L) -   CBC Latest Ref Rng 03/15/2015 03/14/2015 03/14/2015  WBC 4.0 - 10.5 K/uL 10.4 11.8(H) 9.7    Hemoglobin 12.0 - 15.0 g/dL 10.1(L) 12.1 14.1  Hematocrit 36.0 - 46.0 % 30.7(L) 37.0 42.6  Platelets 150 - 400 K/uL 228 235 268      Past Medical History  Diagnosis Date  . Osteoporosis   . Rheumatoid arthritis    Past Surgical History  Procedure Laterality Date  . Intramedullary (im) nail intertrochanteric Right 03/14/2015    Procedure: RIGHT INTERTROCHANTRIC INTRAMEDULLARY (IM) NAIL ;  Surgeon: Mcarthur Rossetti, MD;  Location: [facility];  Service: Orthopedics;  Laterality: Right;   Current Outpatient Prescriptions on File Prior to Visit  Medication Sig Dispense Refill  . acetaminophen (TYLENOL) 325 MG tablet Take 2 tablets (650 mg total) by mouth every 6 (six) hours as needed for mild pain (or Fever >/= 101).    Marland Kitchen aspirin EC 325 MG tablet Take 1 tablet (325 mg total) by mouth daily. 30 tablet 0  . fluticasone (CUTIVATE) 0.05 % cream Apply 1 application topically daily as needed.  0  . Glucosamine-Chondroitin (OSTEO BI-FLEX REGULAR STRENGTH PO) Take 1 tablet by mouth 2 (two) times daily.    Marland Kitchen ibuprofen (MOTRIN IB) 200 MG tablet Take 2-3 tablets (400-600 mg total) by mouth every 6 (six) hours as needed for fever, headache, mild pain or moderate pain. 30 tablet 0  . Multiple Vitamins-Minerals (MULTIVITAMIN WITH MINERALS) tablet Take 1 tablet by  mouth daily.    . sertraline (ZOLOFT) 50 MG tablet Take 50 mg by mouth daily.  0   Social history; the patient lives in her own townhome in [place]. She has children and grandchildren nearby. Previously independent with ADLs and I ADLs. No ambulatory assist device  reports that she has never smoked. She does not have any smokeless tobacco history on file. She reports that she does not drink alcohol or use illicit drugs.  Family history; none related   Review of systems; Gen; no weight loss HEENT: no swallowing difficulties Respiratory; no shortness of breath, no cough, Cardiac; no exertional chest pain, no palpitations GI; no  abnormal pain, no change in bowel habits GU; no dysuria, no voiding difficulties Musculoskeletal; no joint pain, No  Back pain has right leg edema CNS: No focal weakness dizziness or syncopal-type feelings.   Physical examination Temperature 97.9 pulse 90 respirations 20 blood pressure 128/66 Gen. patient is bright alert and able to give her own history HEENT oral exam is normal--prescription lenses visual acuity is grossly intact Respiratory clear entry bilaterally Cardiac; S1 and S2 normal there is a 3/6 midsystolic ejection murmur that radiates to the left carotid. There is no S3 no S4 Abdomen soft nontender with positive bowel sounds Extremities; right hip surgical incision is well-healed there continues to be a  lot of edema in the right thigh with  bruising down the medial aspect of the thigh. s Neurologic; patient is able to come to a standing position with minimal assistance she does well there is no focal problems.  Labs.  03/17/2015.  Sodium 143 potassium 4 BUN 20 creatinine 0.96.  03/15/2015.  WBC 10.4 hemoglobin 10.1 platelets 228  Impression/plan #1 right hip fracture status post ORIF. DVT prophylaxis is with ASA 325 daily Venous Doppler has been ordered and is pending to rule out DVT secondary to continued edema #2 swelling of the right side--venous Doppler is pending as noted above-will update lab work CBC to make sure hemoglobin is stable apparently there were blood loss issues after the operation and she require a transfusion would like to ensure that this is stable-- she appears to be stable but  Bruising  does persist  Update.  Venous Doppler results show that there is no DVT-again will continue to monitor patient and get the update lab work  [id number]

## 2015-03-22 ENCOUNTER — Encounter (HOSPITAL_COMMUNITY): Payer: Self-pay

## 2015-03-22 ENCOUNTER — Other Ambulatory Visit (HOSPITAL_COMMUNITY)
Admission: AD | Admit: 2015-03-22 | Discharge: 2015-03-22 | Disposition: A | Discharge status: Home / Self Care | Payer: Medicare Other | Source: Other Acute Inpatient Hospital | Attending: Internal Medicine | Admitting: Internal Medicine

## 2015-03-22 ENCOUNTER — Observation Stay (HOSPITAL_COMMUNITY)
Admission: EM | Admit: 2015-03-22 | Discharge: 2015-03-24 | Disposition: A | Discharge status: Skilled Nursing Facility | Payer: Medicare Other | Attending: Internal Medicine | Admitting: Internal Medicine

## 2015-03-22 ENCOUNTER — Other Ambulatory Visit: Payer: Self-pay

## 2015-03-22 DIAGNOSIS — M069 Rheumatoid arthritis, unspecified: Secondary | ICD-10-CM | POA: Diagnosis not present

## 2015-03-22 DIAGNOSIS — Z7982 Long term (current) use of aspirin: Secondary | ICD-10-CM | POA: Insufficient documentation

## 2015-03-22 DIAGNOSIS — S72001D Fracture of unspecified part of neck of right femur, subsequent encounter for closed fracture with routine healing: Secondary | ICD-10-CM | POA: Diagnosis not present

## 2015-03-22 DIAGNOSIS — D62 Acute posthemorrhagic anemia: Secondary | ICD-10-CM | POA: Diagnosis not present

## 2015-03-22 DIAGNOSIS — M810 Age-related osteoporosis without current pathological fracture: Secondary | ICD-10-CM | POA: Insufficient documentation

## 2015-03-22 DIAGNOSIS — Z66 Do not resuscitate: Secondary | ICD-10-CM | POA: Insufficient documentation

## 2015-03-22 DIAGNOSIS — E876 Hypokalemia: Secondary | ICD-10-CM | POA: Diagnosis not present

## 2015-03-22 DIAGNOSIS — H3552 Pigmentary retinal dystrophy: Secondary | ICD-10-CM | POA: Diagnosis not present

## 2015-03-22 DIAGNOSIS — I350 Nonrheumatic aortic (valve) stenosis: Secondary | ICD-10-CM | POA: Diagnosis not present

## 2015-03-22 DIAGNOSIS — X58XXXD Exposure to other specified factors, subsequent encounter: Secondary | ICD-10-CM | POA: Insufficient documentation

## 2015-03-22 DIAGNOSIS — Z885 Allergy status to narcotic agent status: Secondary | ICD-10-CM | POA: Insufficient documentation

## 2015-03-22 DIAGNOSIS — Y9289 Other specified places as the place of occurrence of the external cause: Secondary | ICD-10-CM | POA: Insufficient documentation

## 2015-03-22 DIAGNOSIS — R011 Cardiac murmur, unspecified: Secondary | ICD-10-CM | POA: Diagnosis not present

## 2015-03-22 DIAGNOSIS — Y9389 Activity, other specified: Secondary | ICD-10-CM | POA: Insufficient documentation

## 2015-03-22 DIAGNOSIS — S72141D Displaced intertrochanteric fracture of right femur, subsequent encounter for closed fracture with routine healing: Secondary | ICD-10-CM | POA: Diagnosis not present

## 2015-03-22 DIAGNOSIS — Z880 Allergy status to penicillin: Secondary | ICD-10-CM | POA: Insufficient documentation

## 2015-03-22 DIAGNOSIS — Y998 Other external cause status: Secondary | ICD-10-CM | POA: Insufficient documentation

## 2015-03-22 DIAGNOSIS — S72001A Fracture of unspecified part of neck of right femur, initial encounter for closed fracture: Secondary | ICD-10-CM | POA: Diagnosis present

## 2015-03-22 HISTORY — DX: Acute posthemorrhagic anemia: D62

## 2015-03-22 LAB — CBC WITH DIFFERENTIAL/PLATELET
BASOS ABS: 0 10*3/uL (ref 0.0–0.1)
BASOS ABS: 0 10*3/uL (ref 0.0–0.1)
Basophils Absolute: 0 10*3/uL (ref 0.0–0.1)
Basophils Relative: 1 %
Basophils Relative: 1 %
Basophils Relative: 0 %
EOS PCT: 2 %
EOS PCT: 2 %
Eosinophils Absolute: 0.1 10*3/uL (ref 0.0–0.7)
Eosinophils Absolute: 0.1 10*3/uL (ref 0.0–0.7)
Eosinophils Absolute: 0.1 10*3/uL (ref 0.0–0.7)
Eosinophils Relative: 2 %
HCT: 30.6 % — ABNORMAL LOW (ref 36.0–46.0)
HEMATOCRIT: 18.3 % — AB (ref 36.0–46.0)
HEMATOCRIT: 19.8 % — AB (ref 36.0–46.0)
HEMOGLOBIN: 5.7 g/dL — AB (ref 12.0–15.0)
Hemoglobin: 6.3 g/dL — CL (ref 12.0–15.0)
Hemoglobin: 9.9 g/dL — ABNORMAL LOW (ref 12.0–15.0)
LYMPHS ABS: 1.4 10*3/uL (ref 0.7–4.0)
LYMPHS ABS: 1.2 10*3/uL (ref 0.7–4.0)
LYMPHS ABS: 1.5 10*3/uL (ref 0.7–4.0)
LYMPHS PCT: 24 %
LYMPHS PCT: 18 %
LYMPHS PCT: 20 %
MCH: 31.1 pg (ref 26.0–34.0)
MCH: 31.7 pg (ref 26.0–34.0)
MCH: 30.7 pg (ref 26.0–34.0)
MCHC: 31.1 g/dL (ref 30.0–36.0)
MCHC: 31.8 g/dL (ref 30.0–36.0)
MCHC: 32.4 g/dL (ref 30.0–36.0)
MCV: 100 fL (ref 78.0–100.0)
MCV: 99.5 fL (ref 78.0–100.0)
MCV: 94.7 fL (ref 78.0–100.0)
MONO ABS: 0.6 10*3/uL (ref 0.1–1.0)
Monocytes Absolute: 0.8 10*3/uL (ref 0.1–1.0)
Monocytes Absolute: 0.7 10*3/uL (ref 0.1–1.0)
Monocytes Relative: 13 %
Monocytes Relative: 10 %
Monocytes Relative: 9 %
NEUTROS ABS: 3.5 10*3/uL (ref 1.7–7.7)
NEUTROS ABS: 4.3 10*3/uL (ref 1.7–7.7)
NEUTROS PCT: 60 %
Neutro Abs: 5.2 10*3/uL (ref 1.7–7.7)
Neutrophils Relative %: 69 %
Neutrophils Relative %: 69 %
PLATELETS: 411 10*3/uL — AB (ref 150–400)
PLATELETS: 345 10*3/uL (ref 150–400)
Platelets: 363 10*3/uL (ref 150–400)
RBC: 1.83 MIL/uL — AB (ref 3.87–5.11)
RBC: 1.99 MIL/uL — ABNORMAL LOW (ref 3.87–5.11)
RBC: 3.23 MIL/uL — AB (ref 3.87–5.11)
RDW: 16.6 % — ABNORMAL HIGH (ref 11.5–15.5)
RDW: 17.4 % — AB (ref 11.5–15.5)
RDW: 16.7 % — ABNORMAL HIGH (ref 11.5–15.5)
WBC: 5.8 10*3/uL (ref 4.0–10.5)
WBC: 6.3 10*3/uL (ref 4.0–10.5)
WBC: 7.4 10*3/uL (ref 4.0–10.5)

## 2015-03-22 LAB — COMPREHENSIVE METABOLIC PANEL
ALT: 26 U/L (ref 14–54)
AST: 36 U/L (ref 15–41)
Albumin: 3.6 g/dL (ref 3.5–5.0)
Alkaline Phosphatase: 71 U/L (ref 38–126)
Anion gap: 7 (ref 5–15)
BILIRUBIN TOTAL: 1.2 mg/dL (ref 0.3–1.2)
BUN: 23 mg/dL — AB (ref 6–20)
CHLORIDE: 108 mmol/L (ref 101–111)
CO2: 26 mmol/L (ref 22–32)
CREATININE: 0.81 mg/dL (ref 0.44–1.00)
Calcium: 8.6 mg/dL — ABNORMAL LOW (ref 8.9–10.3)
Glucose, Bld: 95 mg/dL (ref 65–99)
POTASSIUM: 3.1 mmol/L — AB (ref 3.5–5.1)
Sodium: 141 mmol/L (ref 135–145)
TOTAL PROTEIN: 6.7 g/dL (ref 6.5–8.1)

## 2015-03-22 LAB — BASIC METABOLIC PANEL
ANION GAP: 7 (ref 5–15)
BUN: 25 mg/dL — AB (ref 6–20)
CHLORIDE: 109 mmol/L (ref 101–111)
CO2: 24 mmol/L (ref 22–32)
Calcium: 8.1 mg/dL — ABNORMAL LOW (ref 8.9–10.3)
Creatinine, Ser: 0.77 mg/dL (ref 0.44–1.00)
Glucose, Bld: 88 mg/dL (ref 65–99)
POTASSIUM: 3.4 mmol/L — AB (ref 3.5–5.1)
SODIUM: 140 mmol/L (ref 135–145)

## 2015-03-22 LAB — PREPARE RBC (CROSSMATCH)

## 2015-03-22 LAB — ABO/RH: ABO/RH(D): O POS

## 2015-03-22 MED ORDER — TRAMADOL HCL 50 MG PO TABS
50.0000 mg | ORAL_TABLET | Freq: Two times a day (BID) | ORAL | Status: DC | PRN
  Administered 2015-03-22 – 2015-03-24 (×4): 50 mg via ORAL
  Filled 2015-03-22 (×5): qty 1

## 2015-03-22 MED ORDER — ACETAMINOPHEN 325 MG PO TABS
650.0000 mg | ORAL_TABLET | Freq: Four times a day (QID) | ORAL | Status: DC | PRN
Start: 2015-03-22 — End: 2015-03-24
  Administered 2015-03-22 – 2015-03-23 (×2): 650 mg via ORAL
  Filled 2015-03-22 (×3): qty 2

## 2015-03-22 MED ORDER — SERTRALINE HCL 50 MG PO TABS
50.0000 mg | ORAL_TABLET | Freq: Every day | ORAL | Status: DC
  Administered 2015-03-23 – 2015-03-24 (×2): 50 mg via ORAL
  Filled 2015-03-22 (×2): qty 1

## 2015-03-22 MED ORDER — SODIUM CHLORIDE 0.9 % IV SOLN
Freq: Once | INTRAVENOUS | Status: AC
Start: 2015-03-22 — End: 2015-03-22
  Administered 2015-03-22: 10:00:00 via INTRAVENOUS

## 2015-03-22 MED ORDER — ALPRAZOLAM 0.25 MG PO TABS: 0.2500 mg | ORAL_TABLET | Freq: Once | ORAL | Status: DC

## 2015-03-22 MED ORDER — SODIUM CHLORIDE 0.9 % IV SOLN
Freq: Once | INTRAVENOUS | Status: AC
  Administered 2015-03-22: 13:00:00 via INTRAVENOUS

## 2015-03-22 NOTE — H&P (Signed)
History and Physical  Michaela Simpson GMW:[id number] DOB: 16-Jul-1931 DOA: 03/22/2015  Referring physician: Milton Ferguson, MD. PCP: Wende Neighbors, MD   Chief Complaint: Low hemoglobin  HPI:  80 y/o female with a hx of RA, osteoporosis, and a recent R hip fracture presented with abnormal lab results--anemia. Patient sustained a closed right intertrochanteric hip fracture on 9/23. ORIF was performed by Dr. Ninfa Linden that day at [facility]. Following her surgery, she was admitted to a rehabilitation facility. Since then, she has been up and walking around during PT. Post-op labs drawn 9/30 revealed the patient was anemic so she was referred back to the ED. While in the ED, labs revealed a Hgb of 5.7.   Patient reports mild SOB the last few days and an episode of lightheadedness this morning upon sitting up.  She is unaware of any alleviating or exacerbating factors and has not taken anything for her symptoms. She has been on ASA but no anticoagulants. She also reports associated darker stools but denies any gross melena, LOC, CP, hematochezia, or HA.   In the emergency department VSS, afebrile, not hypoxic Pertinent labs:   Hgb 5.7, CBC otherwise unremarkable. BMP and LFTs unremarkable. EKG: Independently reviewed. SR, some PACs, prolonged QT, T wave inversion laterally. No old for comparison.   Review of Systems:  Positive for SOB, lightheadedness dark stools.  Negative for fever, visual changes, sore throat, rash, chest pain, dysuria,  n/v/abdominal pain.  Past Medical History  Diagnosis Date  . Osteoporosis   . Rheumatoid arthritis [facility])     Past Surgical History  Procedure Laterality Date  . Intramedullary (im) nail intertrochanteric Right 03/14/2015    Procedure: RIGHT INTERTROCHANTRIC INTRAMEDULLARY (IM) NAIL ;  Surgeon: Mcarthur Rossetti, MD;  Location: [place];  Service: Orthopedics;  Laterality: Right;    Social History:  reports that she has never smoked. She does not have any  smokeless tobacco history on file. She reports that she does not drink alcohol or use illicit drugs. lives at rehab currently Partial assistance  Allergies  Allergen Reactions  . Penicillins Other (See Comments)    Child hood allergy  . Codeine Nausea And Vomiting and Rash    Family History  Problem Relation Age of Onset  . Cancer Sister      Prior to Admission medications   Medication Sig Start Date End Date Taking? Authorizing Provider  acetaminophen (TYLENOL) 325 MG tablet Take 2 tablets (650 mg total) by mouth every 6 (six) hours as needed for mild pain (or Fever >/= 101). 03/17/15  Yes Debbe Odea, MD  aspirin EC 325 MG tablet Take 1 tablet (325 mg total) by mouth daily. 03/15/15  Yes Mcarthur Rossetti, MD  fluticasone (CUTIVATE) 0.05 % cream Apply 1 application topically daily as needed. 12/07/14   Historical Provider, MD  Glucosamine-Chondroitin (OSTEO BI-FLEX REGULAR STRENGTH PO) Take 1 tablet by mouth 2 (two) times daily.    Historical Provider, MD  ibuprofen (MOTRIN IB) 200 MG tablet Take 2-3 tablets (400-600 mg total) by mouth every 6 (six) hours as needed for fever, headache, mild pain or moderate pain. 03/17/15   Debbe Odea, MD  Multiple Vitamins-Minerals (MULTIVITAMIN WITH MINERALS) tablet Take 1 tablet by mouth daily.    Historical Provider, MD  sertraline (ZOLOFT) 50 MG tablet Take 50 mg by mouth daily. 02/18/15   Historical Provider, MD  traMADol Veatrice Bourbon) 50 MG tablet Take one tablet by mouth every 6 hours as needed for pain 03/20/15   Carlos American  Dewaine Oats, NP   Physical Exam: Filed Vitals:   03/22/15 1130 03/22/15 1145 03/22/15 1200 03/22/15 1217  BP: 137/58  146/56 138/64  Pulse: 82 94 88 88  Temp:    98.4 F (36.9 C)  TempSrc:    Oral  Resp: 17 14 22 20   Height:      Weight:      SpO2: 91% 98% 95% 94%    VSS, afebrile, not hypoxic General:  Appears calm and comfortable Eyes:  normal lids, irises & conjunctiva, right pupil larger than left, both round and  reactive.  ENT:  normal hearing, lips & tongue Neck: no LAD, masses or thyromegaly Cardiovascular: RRR, 2/6 holosystolic RUSB murmur, no r/g. No LE edema. Respiratory: CTA bilaterally, no w/r/r. Normal respiratory effort. Abdomen: soft, ntnd, bruising over lower abdomen. Skin: as below Musculoskeletal: Extensive ecchymosis and edema to right thigh and calf, two incisions on right outer thigh, closed with staples, well approximated. No erythema or drainage. TTP to right thigh and calf. Right foot is warm and dry with normal perfusion and sensation.  Psychiatric: grossly normal mood and affect, speech fluent and appropriate Neurologic: grossly non-focal.   Wt Readings from Last 3 Encounters:  03/22/15 55.339 kg (122 lb)  03/14/15 49.442 kg (109 lb)    Labs on Admission:  Basic Metabolic Panel:  Recent Labs Lab 03/17/15 0440 03/22/15 0445 03/22/15 0919  NA 143 140 141  K 4.0 3.4* 3.1*  CL 114* 109 108  CO2 25 24 26   GLUCOSE 95 88 95  BUN 20 25* 23*  CREATININE 0.96 0.77 0.81  CALCIUM 8.2* 8.1* 8.6*    Liver Function Tests:  Recent Labs Lab 03/22/15 0919  AST 36  ALT 26  ALKPHOS 71  BILITOT 1.2  PROT 6.7  ALBUMIN 3.6    CBC:  Recent Labs Lab 03/22/15 0445 03/22/15 0919  WBC 5.8 6.3  NEUTROABS 3.5 4.3  HGB 5.7* 6.3*  HCT 18.3* 19.8*  MCV 100.0 99.5  PLT 363 411*     Radiological Exams on Admission: US Venous Img Lower Unilateral Right  03/21/2015   CLINICAL DATA:  Initial encounter for swelling of the right lower extremity  EXAM: Right LOWER EXTREMITY VENOUS DOPPLER ULTRASOUND  TECHNIQUE: Gray-scale sonography with graded compression, as well as color Doppler and duplex ultrasound were performed to evaluate the lower extremity deep venous systems from the level of the common femoral vein and including the common femoral, femoral, profunda femoral, popliteal and calf veins including the posterior tibial, peroneal and gastrocnemius veins when visible. The  superficial great saphenous vein was also interrogated. Spectral Doppler was utilized to evaluate flow at rest and with distal augmentation maneuvers in the common femoral, femoral and popliteal veins.  COMPARISON:  None.  FINDINGS: Contralateral Common Femoral Vein: Respiratory phasicity is normal and symmetric with the symptomatic side. No evidence of thrombus. Normal compressibility.  Common Femoral Vein: No evidence of thrombus. Normal compressibility, respiratory phasicity and response to augmentation.  Saphenofemoral Junction: No evidence of thrombus. Normal compressibility and flow on color Doppler imaging.  Profunda Femoral Vein: No evidence of thrombus. Normal compressibility and flow on color Doppler imaging.  Femoral Vein: No evidence of thrombus. Normal compressibility, respiratory phasicity and response to augmentation.  Popliteal Vein: No evidence of thrombus. Normal compressibility, respiratory phasicity and response to augmentation.  Calf Veins: No evidence of thrombus. Normal compressibility and flow on color Doppler imaging.  Superficial Great Saphenous Vein: No evidence of thrombus. Normal compressibility and flow on  color Doppler imaging.  Venous Reflux:  Not assessed  Other Findings: There is some soft tissue edema in the region of the calf.  IMPRESSION: No evidence of deep venous thrombosis.   Electronically Signed   By: Misty Stanley M.D.   On: 03/21/2015 15:26      Principal Problem:   Acute blood loss anemia Active Problems:   Closed right hip fracture   Assessment/Plan 1. ABLA. Presume secondary to post-op hematoma, asymptomatic. No evidence of complicating features or vascular compromise. No evidence to suggest GI blood loss 2. Recent right hip fx, s/p surgery. 3. Murmur, presumed aortic stenosis, asymptomatic 4. Rheumatoid arthritis.    Transfuse 2U and follow Hgb closely. Stop ASA and ibuprofen.  Discussed with Dr. Marlou Sa at [facility]. Recommends transfer to [facility] for ortho  consult.   Transfer to [facility] Dr. Verlon Au  Code Status: DNR  DVT prophylaxis:SCDs Family Communication: Daughter at bedside. Discussed with patient and family who understand and have no concerns at this time. Disposition Plan/Anticipated LOS: Transfer to [facility] for further management.   Time spent: 55 minutes  Murray Hodgkins, MD  [facility] Pager [phone] 03/22/2015, 12:23 PM     By signing my name below, I, Rosalie Doctor attest that this documentation has been prepared under the direction and in the presence of Murray Hodgkins, MD Electronically signed: Rosalie Doctor, Scribe.  03/22/2015 11:43am   I personally performed the services described in this documentation. All medical record entries made by the scribe were at my direction. I have reviewed the chart and agree that the record reflects my personal performance and is accurate and complete. Murray Hodgkins, MD

## 2015-03-22 NOTE — Progress Notes (Addendum)
Paged Dr. Verlon Au and Dr. Marlou Sa regarding status of arrival to floor.

## 2015-03-22 NOTE — ED Notes (Signed)
Report given to [facility] at [facility] on [address]. All questions answered.

## 2015-03-22 NOTE — ED Notes (Signed)
Pt reports had hip surgery last Friday.  Reports had blood work yesterday and hemoglobin was low.  Pt c/o generalized weakness and appears pale.

## 2015-03-22 NOTE — ED Provider Notes (Addendum)
CSN: [id number]     Arrival date & time 03/22/15  0845 History  By signing my name below, I, Meriel Pica, attest that this documentation has been prepared under the direction and in the presence of Milton Ferguson, MD. Electronically Signed: Meriel Pica, ED Scribe. 03/22/2015. 9:27 AM.   Chief Complaint  Patient presents with  . low hemoglobin    Patient is a 81 y.o. female presenting with weakness. The history is provided by the patient. No language interpreter was used.  Weakness This is a new (fatigued ) problem. The problem occurs constantly. The problem has been gradually worsening. Pertinent negatives include no chest pain, no abdominal pain and no headaches. Nothing aggravates the symptoms. Nothing relieves the symptoms. She has tried nothing for the symptoms. The treatment provided no relief.   HPI Comments: IANTHA TITSWORTH is a 81 y.o. female, with a PMhx of osteoporosis, rheumatoid arthritis, and R hip fracture, who presents to the Emergency Department complaining of gradually worsening, constant generalized fatigue s/p closed right hip fracture 8 days ago. The pt had blood work drawn yesterday and was found to be anemic. She was admitted on last ED visit 8 days ago for closed right intertrochanteric hip fracture; ORIF was performed by Dr. Ninfa Linden that Friday. She was admitted to a rehabilitation facility following surgery and states she has been up, walking around during therapy. Right leg with diffuse ecchymosis and edema. She was prescribed 325mg  aspirin on 9/24 by Dr. Ninfa Linden. She reports darker than normal BMs but denies melena.   Past Medical History  Diagnosis Date  . Osteoporosis   . Rheumatoid arthritis [facility])    Past Surgical History  Procedure Laterality Date  . Intramedullary (im) nail intertrochanteric Right 03/14/2015    Procedure: RIGHT INTERTROCHANTRIC INTRAMEDULLARY (IM) NAIL ;  Surgeon: Mcarthur Rossetti, MD;  Location: [place];  Service: Orthopedics;   Laterality: Right;   No family history on file. Social History  Substance Use Topics  . Smoking status: Never Smoker   . Smokeless tobacco: None  . Alcohol Use: No   OB History    No data available     Review of Systems  Constitutional: Positive for fatigue. Negative for appetite change.  HENT: Negative for congestion, ear discharge and sinus pressure.   Eyes: Negative for discharge.  Respiratory: Negative for cough.   Cardiovascular: Negative for chest pain.  Gastrointestinal: Negative for abdominal pain and diarrhea.  Genitourinary: Negative for frequency and hematuria.  Musculoskeletal: Negative for back pain.  Skin: Positive for color change (R leg, diffuse ecchymosis ). Negative for rash.  Neurological: Negative for seizures and headaches.  Psychiatric/Behavioral: Negative for hallucinations.   Allergies  Penicillins and Codeine  Home Medications   Prior to Admission medications   Medication Sig Start Date End Date Taking? Authorizing Provider  acetaminophen (TYLENOL) 325 MG tablet Take 2 tablets (650 mg total) by mouth every 6 (six) hours as needed for mild pain (or Fever >/= 101). 03/17/15   Debbe Odea, MD  aspirin EC 325 MG tablet Take 1 tablet (325 mg total) by mouth daily. 03/15/15   Mcarthur Rossetti, MD  fluticasone (CUTIVATE) 0.05 % cream Apply 1 application topically daily as needed. 12/07/14   Historical Provider, MD  Glucosamine-Chondroitin (OSTEO BI-FLEX REGULAR STRENGTH PO) Take 1 tablet by mouth 2 (two) times daily.    Historical Provider, MD  ibuprofen (MOTRIN IB) 200 MG tablet Take 2-3 tablets (400-600 mg total) by mouth every 6 (six) hours as needed  for fever, headache, mild pain or moderate pain. 03/17/15   Debbe Odea, MD  Multiple Vitamins-Minerals (MULTIVITAMIN WITH MINERALS) tablet Take 1 tablet by mouth daily.    Historical Provider, MD  sertraline (ZOLOFT) 50 MG tablet Take 50 mg by mouth daily. 02/18/15   Historical Provider, MD  traMADol  (ULTRAM) 50 MG tablet Take one tablet by mouth every 6 hours as needed for pain 03/20/15   Lauree Chandler, NP   BP 137/64 mmHg  Pulse 87  Temp(Src) 98.1 F (36.7 C) (Oral)  Resp 18  Ht 5\' 2"  (1.575 m)  Wt 122 lb (55.339 kg)  BMI 22.31 kg/m2  SpO2 99% Physical Exam  Constitutional: She is oriented to person, place, and time. She appears well-developed.  Pt lethargic.  HENT:  Head: Normocephalic.  Eyes: Conjunctivae and EOM are normal. No scleral icterus.  Sclera pale bilaterally.   Neck: Neck supple. No thyromegaly present.  Cardiovascular: Normal rate and regular rhythm.  Exam reveals no gallop and no friction rub.   Murmur heard. 3/6 systolic murmur.   Pulmonary/Chest: No stridor. She has no wheezes. She has no rales. She exhibits no tenderness.  Abdominal: She exhibits no distension. There is no tenderness. There is no rebound.  Genitourinary: Guaiac negative stool.  Rectal exam; hemoccult negative.   Musculoskeletal: Normal range of motion. She exhibits no edema.  Healing incision to right hip, bruising up entire right leg down to foot.   Lymphadenopathy:    She has no cervical adenopathy.  Neurological: She is oriented to person, place, and time. She exhibits normal muscle tone. Coordination normal.  Skin: No rash noted. No erythema. There is pallor.  Psychiatric: She has a normal mood and affect. Her behavior is normal.    ED Course  Procedures  DIAGNOSTIC STUDIES: Oxygen Saturation is 99% on RA, normal by my interpretation.    COORDINATION OF CARE: 9:09 AM Discussed treatment plan with pt at bedside and pt agreed to plan.   Labs Review Labs Reviewed  CBC WITH DIFFERENTIAL/PLATELET - Abnormal; Notable for the following:    RBC 1.99 (*)    Hemoglobin 6.3 (*)    HCT 19.8 (*)    RDW 17.4 (*)    Platelets 411 (*)    All other components within normal limits  COMPREHENSIVE METABOLIC PANEL - Abnormal; Notable for the following:    Potassium 3.1 (*)    BUN 23  (*)    Calcium 8.6 (*)    All other components within normal limits  TYPE AND SCREEN  PREPARE RBC (CROSSMATCH)  ABO/RH    Imaging Review US Venous Img Lower Unilateral Right  03/21/2015   CLINICAL DATA:  Initial encounter for swelling of the right lower extremity  EXAM: Right LOWER EXTREMITY VENOUS DOPPLER ULTRASOUND  TECHNIQUE: Gray-scale sonography with graded compression, as well as color Doppler and duplex ultrasound were performed to evaluate the lower extremity deep venous systems from the level of the common femoral vein and including the common femoral, femoral, profunda femoral, popliteal and calf veins including the posterior tibial, peroneal and gastrocnemius veins when visible. The superficial great saphenous vein was also interrogated. Spectral Doppler was utilized to evaluate flow at rest and with distal augmentation maneuvers in the common femoral, femoral and popliteal veins.  COMPARISON:  None.  FINDINGS: Contralateral Common Femoral Vein: Respiratory phasicity is normal and symmetric with the symptomatic side. No evidence of thrombus. Normal compressibility.  Common Femoral Vein: No evidence of thrombus. Normal compressibility, respiratory phasicity  and response to augmentation.  Saphenofemoral Junction: No evidence of thrombus. Normal compressibility and flow on color Doppler imaging.  Profunda Femoral Vein: No evidence of thrombus. Normal compressibility and flow on color Doppler imaging.  Femoral Vein: No evidence of thrombus. Normal compressibility, respiratory phasicity and response to augmentation.  Popliteal Vein: No evidence of thrombus. Normal compressibility, respiratory phasicity and response to augmentation.  Calf Veins: No evidence of thrombus. Normal compressibility and flow on color Doppler imaging.  Superficial Great Saphenous Vein: No evidence of thrombus. Normal compressibility and flow on color Doppler imaging.  Venous Reflux:  Not assessed  Other Findings: There is  some soft tissue edema in the region of the calf.  IMPRESSION: No evidence of deep venous thrombosis.   Electronically Signed   By: Misty Stanley M.D.   On: 03/21/2015 15:26   I have personally reviewed and evaluated these images and lab results as part of my medical decision-making.  CRITICAL CARE Performed by: Mosi Hannold L Total critical care time: 35 Critical care time was exclusive of separately billable procedures and treating other patients. Critical care was necessary to treat or prevent imminent or life-threatening deterioration. Critical care was time spent personally by me on the following activities: development of treatment plan with patient and/or surrogate as well as nursing, discussions with consultants, evaluation of patient's response to treatment, examination of patient, obtaining history from patient or surrogate, ordering and performing treatments and interventions, ordering and review of laboratory studies, ordering and review of radiographic studies, pulse oximetry and re-evaluation of patient's condition.  MDM   Final diagnoses:  None   Anemia secondary to blood loss from right hip surgery. Patient will be transfused and admitted.  Milton Ferguson, MD 03/22/15 [place], MD 03/22/15 1100

## 2015-03-22 NOTE — ED Notes (Signed)
Report given to [facility] at [place]. ETA 1 hour

## 2015-03-22 NOTE — Progress Notes (Signed)
Pt resting comfortably Right leg with ecchymosis and bruising thigh and calf Foot perfused and sensate - pulse dopplerable hgb up to 9.9 from 5.7 compts soft Recheck hgb am

## 2015-03-22 NOTE — ED Notes (Signed)
Patient left at this time with [facility]

## 2015-03-22 NOTE — Progress Notes (Signed)
Pt admitted to the unit at 1715. Pt mental status is alert to person, place, time, situation, self. Pt oriented to room, staff, and call bell. Skin is intact other than prior surgical site to right hip. Full assessment charted in [facility]. Call bell within reach. Visitor guidelines reviewed w/ pt and/or family. Blood transfusing upon arrival with no adverse reactions

## 2015-03-22 NOTE — ED Notes (Signed)
MD at bedside. 

## 2015-03-23 DIAGNOSIS — S72001D Fracture of unspecified part of neck of right femur, subsequent encounter for closed fracture with routine healing: Secondary | ICD-10-CM

## 2015-03-23 DIAGNOSIS — D62 Acute posthemorrhagic anemia: Secondary | ICD-10-CM

## 2015-03-23 LAB — TYPE AND SCREEN
ABO/RH(D): O POS
ANTIBODY SCREEN: NEGATIVE
UNIT DIVISION: 0
Unit division: 0

## 2015-03-23 LAB — BASIC METABOLIC PANEL
Anion gap: 8 (ref 5–15)
BUN: 14 mg/dL (ref 6–20)
CHLORIDE: 106 mmol/L (ref 101–111)
CO2: 23 mmol/L (ref 22–32)
Calcium: 8.6 mg/dL — ABNORMAL LOW (ref 8.9–10.3)
Creatinine, Ser: 0.73 mg/dL (ref 0.44–1.00)
GLUCOSE: 84 mg/dL (ref 65–99)
POTASSIUM: 3.4 mmol/L — AB (ref 3.5–5.1)
SODIUM: 137 mmol/L (ref 135–145)

## 2015-03-23 LAB — CBC
HEMATOCRIT: 29.3 % — AB (ref 36.0–46.0)
HEMOGLOBIN: 9.5 g/dL — AB (ref 12.0–15.0)
MCH: 30.8 pg (ref 26.0–34.0)
MCHC: 32.4 g/dL (ref 30.0–36.0)
MCV: 95.1 fL (ref 78.0–100.0)
Platelets: 320 10*3/uL (ref 150–400)
RBC: 3.08 MIL/uL — ABNORMAL LOW (ref 3.87–5.11)
RDW: 17.1 % — ABNORMAL HIGH (ref 11.5–15.5)
WBC: 7 10*3/uL (ref 4.0–10.5)

## 2015-03-23 MED ORDER — POTASSIUM CHLORIDE CRYS ER 20 MEQ PO TBCR
60.0000 meq | EXTENDED_RELEASE_TABLET | Freq: Once | ORAL | Status: AC
  Administered 2015-03-23: 60 meq via ORAL
  Filled 2015-03-23: qty 3

## 2015-03-23 MED ORDER — ZOLPIDEM TARTRATE 5 MG PO TABS
5.0000 mg | ORAL_TABLET | Freq: Once | ORAL | Status: AC
  Administered 2015-03-24: 5 mg via ORAL
  Filled 2015-03-23: qty 1

## 2015-03-23 MED ORDER — PANTOPRAZOLE SODIUM 40 MG PO TBEC
40.0000 mg | DELAYED_RELEASE_TABLET | Freq: Every day | ORAL | Status: DC
  Administered 2015-03-23 – 2015-03-24 (×2): 40 mg via ORAL
  Filled 2015-03-23 (×2): qty 1

## 2015-03-23 NOTE — Progress Notes (Signed)
hgb up today Bruising on /in leg slightly more than usual Follow clinically

## 2015-03-23 NOTE — Progress Notes (Signed)
[facility] PROGRESS NOTE   Michaela Simpson TKW:[id number] DOB: [birth year] DOA: 03/22/2015 PCP: Wende Neighbors, MD  HPI/Subjective: Feels okay, much better than yesterday. Significant bruising in the right hip and going down to the whole right leg.  Assessment/Plan: Principal Problem:   Acute blood loss anemia Active Problems:   Closed right hip fracture (HCC)   Acute blood loss anemia Baseline hemoglobin is 12.1, shows the hospital on 924 with hemoglobin of 10.1. Presented back to the hospital with hemoglobin of 5.7, status post transfusion of 2 units of packed RBCs. Hemoglobin improved to 9.6 this morning. This is likely secondary to perioperative blood loss, cannot rule out slow GI bleed as she mentioned she had dark stools. Check FOBT.  Closed right hip fracture Status post repair done by Dr. Ninfa Linden on 9/23. WBAT.  Code Status: Full Code Family Communication: Plan discussed with the patient. Disposition Plan: Remains inpatient Diet: Diet regular Room service appropriate?: Yes; Fluid consistency:: Thin  Consultants:  Ortho   Procedures:  None  Antibiotics:  None (indicate start date, and stop date if known)   Objective: Filed Vitals:   03/23/15 0550  BP: 168/74  Pulse: 88  Temp: 98.6 F (37 C)  Resp: 19    Intake/Output Summary (Last 24 hours) at 03/23/15 1136 Last data filed at 03/23/15 1034  Gross per 24 hour  Intake    585 ml  Output      0 ml  Net    585 ml   Filed Weights   03/22/15 0854 03/22/15 1828  Weight: 55.339 kg (122 lb) 56.337 kg (124 lb 3.2 oz)    Exam: General: Alert and awake, oriented x3, not in any acute distress. HEENT: anicteric sclera, pupils reactive to light and accommodation, EOMI CVS: S1-S2 clear, no murmur rubs or gallops Chest: clear to auscultation bilaterally, no wheezing, rales or rhonchi Abdomen: soft nontender, nondistended, normal bowel sounds, no organomegaly Extremities: no cyanosis, clubbing or  edema noted bilaterally Neuro: Cranial nerves II-XII intact, no focal neurological deficits  Data Reviewed: Basic Metabolic Panel:  Recent Labs Lab 03/17/15 0440 03/22/15 0445 03/22/15 0919 03/23/15 0455  NA 143 140 141 137  K 4.0 3.4* 3.1* 3.4*  CL 114* 109 108 106  CO2 25 24 26 23   GLUCOSE 95 88 95 84  BUN 20 25* 23* 14  CREATININE 0.96 0.77 0.81 0.73  CALCIUM 8.2* 8.1* 8.6* 8.6*   Liver Function Tests:  Recent Labs Lab 03/22/15 0919  AST 36  ALT 26  ALKPHOS 71  BILITOT 1.2  PROT 6.7  ALBUMIN 3.6   No results for input(s): LIPASE, AMYLASE in the last 168 hours. No results for input(s): AMMONIA in the last 168 hours. CBC:  Recent Labs Lab 03/22/15 0445 03/22/15 0919 03/22/15 2023 03/23/15 0455  WBC 5.8 6.3 7.4 7.0  NEUTROABS 3.5 4.3 5.2  --   HGB 5.7* 6.3* 9.9* 9.5*  HCT 18.3* 19.8* 30.6* 29.3*  MCV 100.0 99.5 94.7 95.1  PLT 363 411* 345 320   Cardiac Enzymes: No results for input(s): CKTOTAL, CKMB, CKMBINDEX, TROPONINI in the last 168 hours. BNP (last 3 results) No results for input(s): BNP in the last 8760 hours.  ProBNP (last 3 results) No results for input(s): PROBNP in the last 8760 hours.  CBG: No results for input(s): GLUCAP in the last 168 hours.  Micro Recent Results (from the past 240 hour(s))  Surgical pcr screen     Status: None   Collection Time: 03/14/15  8:51 PM  Result Value Ref Range Status   MRSA, PCR NEGATIVE NEGATIVE Final   Staphylococcus aureus NEGATIVE NEGATIVE Final    Comment:        The Xpert SA Assay (FDA approved for NASAL specimens in patients over 8 years of age), is one component of a comprehensive surveillance program.  Test performance has been validated by [facility] for patients greater than or equal to 33 year old. It is not intended to diagnose infection nor to guide or monitor treatment.      Studies: US Venous Img Lower Unilateral Right  03/21/2015   CLINICAL DATA:  Initial encounter for  swelling of the right lower extremity  EXAM: Right LOWER EXTREMITY VENOUS DOPPLER ULTRASOUND  TECHNIQUE: Gray-scale sonography with graded compression, as well as color Doppler and duplex ultrasound were performed to evaluate the lower extremity deep venous systems from the level of the common femoral vein and including the common femoral, femoral, profunda femoral, popliteal and calf veins including the posterior tibial, peroneal and gastrocnemius veins when visible. The superficial great saphenous vein was also interrogated. Spectral Doppler was utilized to evaluate flow at rest and with distal augmentation maneuvers in the common femoral, femoral and popliteal veins.  COMPARISON:  None.  FINDINGS: Contralateral Common Femoral Vein: Respiratory phasicity is normal and symmetric with the symptomatic side. No evidence of thrombus. Normal compressibility.  Common Femoral Vein: No evidence of thrombus. Normal compressibility, respiratory phasicity and response to augmentation.  Saphenofemoral Junction: No evidence of thrombus. Normal compressibility and flow on color Doppler imaging.  Profunda Femoral Vein: No evidence of thrombus. Normal compressibility and flow on color Doppler imaging.  Femoral Vein: No evidence of thrombus. Normal compressibility, respiratory phasicity and response to augmentation.  Popliteal Vein: No evidence of thrombus. Normal compressibility, respiratory phasicity and response to augmentation.  Calf Veins: No evidence of thrombus. Normal compressibility and flow on color Doppler imaging.  Superficial Great Saphenous Vein: No evidence of thrombus. Normal compressibility and flow on color Doppler imaging.  Venous Reflux:  Not assessed  Other Findings: There is some soft tissue edema in the region of the calf.  IMPRESSION: No evidence of deep venous thrombosis.   Electronically Signed   By: Misty Stanley M.D.   On: 03/21/2015 15:26    Scheduled Meds: . ALPRAZolam  0.25 mg Oral Once  .  sertraline  50 mg Oral Daily   Continuous Infusions:      Time spent: 35 minutes    [facility] A  [facility] Pager [phone] If 7PM-7AM, please contact night-coverage at [email or web address], password [facility] 03/23/2015, 11:36 AM

## 2015-03-24 DIAGNOSIS — E876 Hypokalemia: Secondary | ICD-10-CM | POA: Diagnosis not present

## 2015-03-24 DIAGNOSIS — Z9981 Dependence on supplemental oxygen: Secondary | ICD-10-CM | POA: Diagnosis not present

## 2015-03-24 DIAGNOSIS — D62 Acute posthemorrhagic anemia: Secondary | ICD-10-CM | POA: Diagnosis not present

## 2015-03-24 DIAGNOSIS — D649 Anemia, unspecified: Secondary | ICD-10-CM | POA: Diagnosis not present

## 2015-03-24 DIAGNOSIS — I350 Nonrheumatic aortic (valve) stenosis: Secondary | ICD-10-CM | POA: Diagnosis not present

## 2015-03-24 DIAGNOSIS — Z4789 Encounter for other orthopedic aftercare: Secondary | ICD-10-CM | POA: Diagnosis not present

## 2015-03-24 DIAGNOSIS — S72141D Displaced intertrochanteric fracture of right femur, subsequent encounter for closed fracture with routine healing: Secondary | ICD-10-CM | POA: Diagnosis not present

## 2015-03-24 DIAGNOSIS — M069 Rheumatoid arthritis, unspecified: Secondary | ICD-10-CM | POA: Diagnosis not present

## 2015-03-24 DIAGNOSIS — S72001D Fracture of unspecified part of neck of right femur, subsequent encounter for closed fracture with routine healing: Secondary | ICD-10-CM | POA: Diagnosis not present

## 2015-03-24 DIAGNOSIS — R278 Other lack of coordination: Secondary | ICD-10-CM | POA: Diagnosis not present

## 2015-03-24 DIAGNOSIS — M6281 Muscle weakness (generalized): Secondary | ICD-10-CM | POA: Diagnosis not present

## 2015-03-24 DIAGNOSIS — M810 Age-related osteoporosis without current pathological fracture: Secondary | ICD-10-CM | POA: Diagnosis not present

## 2015-03-24 DIAGNOSIS — Z9181 History of falling: Secondary | ICD-10-CM | POA: Diagnosis not present

## 2015-03-24 DIAGNOSIS — R262 Difficulty in walking, not elsewhere classified: Secondary | ICD-10-CM | POA: Diagnosis not present

## 2015-03-24 DIAGNOSIS — R011 Cardiac murmur, unspecified: Secondary | ICD-10-CM | POA: Diagnosis not present

## 2015-03-24 DIAGNOSIS — R21 Rash and other nonspecific skin eruption: Secondary | ICD-10-CM | POA: Diagnosis not present

## 2015-03-24 LAB — BASIC METABOLIC PANEL
Anion gap: 8 (ref 5–15)
BUN: 14 mg/dL (ref 6–20)
CALCIUM: 9 mg/dL (ref 8.9–10.3)
CO2: 23 mmol/L (ref 22–32)
CREATININE: 0.79 mg/dL (ref 0.44–1.00)
Chloride: 105 mmol/L (ref 101–111)
GFR calc non Af Amer: >60 mL/min (ref >60)
Glucose, Bld: 85 mg/dL (ref 65–99)
Potassium: 4.1 mmol/L (ref 3.5–5.1)
SODIUM: 136 mmol/L (ref 135–145)

## 2015-03-24 LAB — CBC
HCT: 32.5 % — ABNORMAL LOW (ref 36.0–46.0)
Hemoglobin: 10.6 g/dL — ABNORMAL LOW (ref 12.0–15.0)
MCH: 31.6 pg (ref 26.0–34.0)
MCHC: 32.6 g/dL (ref 30.0–36.0)
MCV: 97 fL (ref 78.0–100.0)
PLATELETS: 376 10*3/uL (ref 150–400)
RBC: 3.35 MIL/uL — AB (ref 3.87–5.11)
RDW: 18.1 % — AB (ref 11.5–15.5)
WBC: 7.6 10*3/uL (ref 4.0–10.5)

## 2015-03-24 LAB — POC OCCULT BLOOD, ED: FECAL OCCULT BLD: NEGATIVE

## 2015-03-24 MED ORDER — TEMAZEPAM 7.5 MG PO CAPS: 7.5000 mg | ORAL_CAPSULE | Freq: Every evening | ORAL | Status: DC | PRN

## 2015-03-24 NOTE — Progress Notes (Addendum)
PIVs removed-pt tol well. Paper gown placed, personal belongings in bag. Report called to [place] at [facility]. [phone].  Pt waiting for ambulance.

## 2015-03-24 NOTE — Discharge Summary (Signed)
Physician Discharge Summary  Michaela Simpson:[id number] DOB: Mar 02, 1932 DOA: 03/22/2015  PCP: Wende Neighbors, MD  Admit date: 03/22/2015 Discharge date: 03/24/2015  Time spent: 40 minutes  Recommendations for Outpatient Follow-up:  1. Follow-up with the nursing home M.D.  Discharge Diagnoses:  Principal Problem:   Acute blood loss anemia Active Problems:   Closed right hip fracture [facility])   Discharge Condition: Stable  Diet recommendation: Heart healthy  Filed Weights   03/22/15 0854 03/22/15 1828  Weight: 55.339 kg (122 lb) 56.337 kg (124 lb 3.2 oz)    History of present illness:  81 y/o female with a hx of RA, osteoporosis, and a recent R hip fracture presented with abnormal lab results--anemia. Patient sustained a closed right intertrochanteric hip fracture on 9/23. ORIF was performed by Dr. Ninfa Linden that day at [facility]. Following her surgery, she was admitted to a rehabilitation facility. Since then, she has been up and walking around during PT. Post-op labs drawn 9/30 revealed the patient was anemic so she was referred back to the ED. While in the ED, labs revealed a Hgb of 5.7.   Patient reports mild SOB the last few days and an episode of lightheadedness this morning upon sitting up. She is unaware of any alleviating or exacerbating factors and has not taken anything for her symptoms. She has been on ASA but no anticoagulants. She also reports associated darker stools but denies any gross melena, LOC, CP, hematochezia, or HA.   In the emergency department VSS, afebrile, not hypoxic Pertinent labs: Hgb 5.7, CBC otherwise unremarkable. BMP and LFTs unremarkable. EKG: Independently reviewed. SR, some PACs, prolonged QT, T wave inversion laterally. No old for comparison.    Hospital Course:   Acute blood loss anemia Baseline hemoglobin is 12.1, shows the hospital on 924 with hemoglobin of 10.1. Presented back to the hospital with hemoglobin of 5.7, status post transfusion  of 2 units of packed RBCs. Hemoglobin improved to 9.6 this morning. This is likely secondary to perioperative blood loss. This is all likely to be secondary to GI blood losses, patient does not have overt bleeding. Patient mentioned dark stools, she is on ibuprofen and aspirin, both discontinued, FOBT ordered but patient did not have bowel movement which is in the hospital. Hemoglobin went up to 9.9 just after transfusion, today is 10.6., Discharged back to the nursing home.  Closed right hip fracture Status post repair done by Dr. Ninfa Linden on 9/23. WBAT. Continue therapy at the nursing home.  Hypokalemia Mild hypokalemia of 3.1 on admission, this was repleted with oral supplements.  Procedures:  None  Consultations:  Orthopedics  Discharge Exam: Filed Vitals:   03/24/15 0604  BP: 138/58  Pulse: 83  Temp: 98 F (36.7 C)  Resp: 16   General: Alert and awake, oriented x3, not in any acute distress. HEENT: anicteric sclera, pupils reactive to light and accommodation, EOMI CVS: S1-S2 clear, no murmur rubs or gallops Chest: clear to auscultation bilaterally, no wheezing, rales or rhonchi Abdomen: soft nontender, nondistended, normal bowel sounds, no organomegaly Extremities: no cyanosis, clubbing or edema noted bilaterally Neuro: Cranial nerves II-XII intact, no focal neurological deficits  Discharge Instructions   Discharge Instructions    Diet - low sodium heart healthy    Complete by:  As directed      Increase activity slowly    Complete by:  As directed           Current Discharge Medication List    CONTINUE these medications which  have CHANGED   Details  temazepam (RESTORIL) 7.5 MG capsule Take 1 capsule (7.5 mg total) by mouth at bedtime as needed for sleep. Qty: 10 capsule, Refills: 0      CONTINUE these medications which have NOT CHANGED   Details  acetaminophen (TYLENOL) 325 MG tablet Take 2 tablets (650 mg total) by mouth every 6 (six) hours as needed  for mild pain (or Fever >/= 101).    aspirin EC 325 MG tablet Take 1 tablet (325 mg total) by mouth daily. Qty: 30 tablet, Refills: 0    Glucosamine-Chondroitin (OSTEO BI-FLEX REGULAR STRENGTH PO) Take 1 tablet by mouth 2 (two) times daily.    Multiple Vitamins-Minerals (MULTIVITAMIN WITH MINERALS) tablet Take 1 tablet by mouth daily.    PNEUMOCOCCAL 13-VAL CONJ VACC IM Inject into the muscle.    sertraline (ZOLOFT) 50 MG tablet Take 50 mg by mouth daily. Refills: 0    traMADol (ULTRAM) 50 MG tablet Take one tablet by mouth every 6 hours as needed for pain Qty: 120 tablet, Refills: 0      STOP taking these medications     ibuprofen (MOTRIN IB) 200 MG tablet        Allergies  Allergen Reactions  . Penicillins Other (See Comments)    Child hood allergy  . Codeine Nausea And Vomiting and Rash   Follow-up Information    Follow up with Wende Neighbors, MD In 1 week.   Specialty:  Internal Medicine   Contact information:   [place] [address] [phone]        The results of significant diagnostics from this hospitalization (including imaging, microbiology, ancillary and laboratory) are listed below for reference.    Significant Diagnostic Studies: Dg Pelvis Portable  03/14/2015   CLINICAL DATA:  She was leaning against an elevator door that opened and she fell backwards landing on her rt hip. Severe pain rt hip.  EXAM: PORTABLE PELVIS 1-2 VIEWS  COMPARISON:  08/06/2010  FINDINGS: There is a comminuted intertrochanteric fracture of the right hip, associated with varus angulation. No dislocation. The pelvis and left hip appear intact.  IMPRESSION: Intertrochanteric fracture right hip.   Electronically Signed   By: Nolon Nations M.D.   On: 03/14/2015 13:49   US Venous Img Lower Unilateral Right  03/21/2015   CLINICAL DATA:  Initial encounter for swelling of the right lower extremity  EXAM: Right LOWER EXTREMITY VENOUS DOPPLER ULTRASOUND  TECHNIQUE:  Gray-scale sonography with graded compression, as well as color Doppler and duplex ultrasound were performed to evaluate the lower extremity deep venous systems from the level of the common femoral vein and including the common femoral, femoral, profunda femoral, popliteal and calf veins including the posterior tibial, peroneal and gastrocnemius veins when visible. The superficial great saphenous vein was also interrogated. Spectral Doppler was utilized to evaluate flow at rest and with distal augmentation maneuvers in the common femoral, femoral and popliteal veins.  COMPARISON:  None.  FINDINGS: Contralateral Common Femoral Vein: Respiratory phasicity is normal and symmetric with the symptomatic side. No evidence of thrombus. Normal compressibility.  Common Femoral Vein: No evidence of thrombus. Normal compressibility, respiratory phasicity and response to augmentation.  Saphenofemoral Junction: No evidence of thrombus. Normal compressibility and flow on color Doppler imaging.  Profunda Femoral Vein: No evidence of thrombus. Normal compressibility and flow on color Doppler imaging.  Femoral Vein: No evidence of thrombus. Normal compressibility, respiratory phasicity and response to augmentation.  Popliteal Vein: No evidence of  thrombus. Normal compressibility, respiratory phasicity and response to augmentation.  Calf Veins: No evidence of thrombus. Normal compressibility and flow on color Doppler imaging.  Superficial Great Saphenous Vein: No evidence of thrombus. Normal compressibility and flow on color Doppler imaging.  Venous Reflux:  Not assessed  Other Findings: There is some soft tissue edema in the region of the calf.  IMPRESSION: No evidence of deep venous thrombosis.   Electronically Signed   By: Misty Stanley M.D.   On: 03/21/2015 15:26   Dg Chest Portable 1 View  03/14/2015   CLINICAL DATA:  [place] today.  Right hip fracture.  EXAM: PORTABLE CHEST 1 VIEW  COMPARISON:  None.  FINDINGS: Cardiac  silhouette is normal in size. Aorta is mildly tortuous. No mediastinal or hilar masses or evidence of adenopathy.  Clear lungs.  No pleural effusion or pneumothorax.  Bony thorax is demineralized but grossly intact.  IMPRESSION: No acute cardiopulmonary disease.   Electronically Signed   By: Lajean Manes M.D.   On: 03/14/2015 13:50   Dg C-arm 1-60 Min  03/14/2015   CLINICAL DATA:  Patient status post ORIF proximal right femur.  EXAM: LEFT FEMUR 2 VIEWS; DG C-ARM 61-120 MIN  COMPARISON:  03/14/2015 pelvic radiograph.  FINDINGS: Patient status post intra medullary rod and screw fixation of proximal right femur fracture. Four fluoroscopic intraoperative images were submitted for interpretation. Osseous fragment is demonstrated on the oblique view anterior to femoral neck.  IMPRESSION: Patient status post ORIF proximal right femur fracture.   Electronically Signed   By: Lovey Newcomer M.D.   On: 03/14/2015 22:33   Dg Femur Min 2 Views Left  03/14/2015   CLINICAL DATA:  Patient status post ORIF proximal right femur.  EXAM: LEFT FEMUR 2 VIEWS; DG C-ARM 61-120 MIN  COMPARISON:  03/14/2015 pelvic radiograph.  FINDINGS: Patient status post intra medullary rod and screw fixation of proximal right femur fracture. Four fluoroscopic intraoperative images were submitted for interpretation. Osseous fragment is demonstrated on the oblique view anterior to femoral neck.  IMPRESSION: Patient status post ORIF proximal right femur fracture.   Electronically Signed   By: Lovey Newcomer M.D.   On: 03/14/2015 22:33    Microbiology: Recent Results (from the past 240 hour(s))  Surgical pcr screen     Status: None   Collection Time: 03/14/15  8:51 PM  Result Value Ref Range Status   MRSA, PCR NEGATIVE NEGATIVE Final   Staphylococcus aureus NEGATIVE NEGATIVE Final    Comment:        The Xpert SA Assay (FDA approved for NASAL specimens in patients over 78 years of age), is one component of a comprehensive surveillance program.   Test performance has been validated by [facility] for patients greater than or equal to 37 year old. It is not intended to diagnose infection nor to guide or monitor treatment.      Labs: Basic Metabolic Panel:  Recent Labs Lab 03/22/15 0445 03/22/15 0919 03/23/15 0455 03/24/15 0700  NA 140 141 137 136  K 3.4* 3.1* 3.4* 4.1  CL 109 108 106 105  CO2 24 26 23 23   GLUCOSE 88 95 84 85  BUN 25* 23* 14 14  CREATININE 0.77 0.81 0.73 0.79  CALCIUM 8.1* 8.6* 8.6* 9.0   Liver Function Tests:  Recent Labs Lab 03/22/15 0919  AST 36  ALT 26  ALKPHOS 71  BILITOT 1.2  PROT 6.7  ALBUMIN 3.6   No results for input(s): LIPASE, AMYLASE in the  last 168 hours. No results for input(s): AMMONIA in the last 168 hours. CBC:  Recent Labs Lab 03/22/15 0445 03/22/15 0919 03/22/15 2023 03/23/15 0455 03/24/15 0700  WBC 5.8 6.3 7.4 7.0 7.6  NEUTROABS 3.5 4.3 5.2  --   --   HGB 5.7* 6.3* 9.9* 9.5* 10.6*  HCT 18.3* 19.8* 30.6* 29.3* 32.5*  MCV 100.0 99.5 94.7 95.1 97.0  PLT 363 411* 345 320 376   Cardiac Enzymes: No results for input(s): CKTOTAL, CKMB, CKMBINDEX, TROPONINI in the last 168 hours. BNP: BNP (last 3 results) No results for input(s): BNP in the last 8760 hours.  ProBNP (last 3 results) No results for input(s): PROBNP in the last 8760 hours.  CBG: No results for input(s): GLUCAP in the last 168 hours.     Signed:  Lille Karim A  [facility] 03/24/2015, 10:57 AM

## 2015-03-24 NOTE — Progress Notes (Signed)
Pt left for [facility] on stretcher via ambulance

## 2015-03-24 NOTE — Clinical Social Work Note (Signed)
Clinical Social Work Assessment  Patient Details  Name: Michaela Simpson MRN: [id number] Date of Birth: [birth year]  Date of referral:  03/24/15               Reason for consult:  Facility Placement                Permission sought to share information with:  Facility Art therapist granted to share information::  Yes, Verbal Permission Granted  Name::     [place]::     Relationship::  Admissions Director with nursing facility  Contact Information:     Housing/Transportation Living arrangements for the past 2 months:  [place] of Information:  Patient Patient Interpreter Needed:  None Criminal Activity/Legal Involvement Pertinent to Current Situation/Hospitalization:  No - Comment as needed Significant Relationships:  Adult Children Lives with:  Facility Resident Do you feel safe going back to the place where you live?  Yes (Patient feels safe in her home but understands that she nees to continue her therapy before returning home.) Need for family participation in patient care:  No (Coment)  Care giving concerns:  None expressed by patient   Social Worker assessment / plan:  CSW talked with patient who was sitting up in the chair in her room. Patient is alert, oriented, pleasant and open to talking with CSW. Patient aware that she is discharging back to [facility] today, transported by ambulance and has contacted all of her children to inform them.  Employment status:  Retired Health visitor, Managed Care PT Recommendations:  Not assessed at this time Information / Referral to community resources:  [place]  Patient/Family's Response to care:  Patient expressed no concerns regarding her care.  Patient/Family's Understanding of and Emotional Response to Diagnosis, Current Treatment, and Prognosis:  Not discussed.  Emotional Assessment Appearance:  Appears stated  age Attitude/Demeanor/Rapport:  Other (Appropriate) Affect (typically observed):  Pleasant, Appropriate Orientation:  Oriented to Self, Oriented to Place, Oriented to  Time, Oriented to Situation Alcohol / Substance use:  Never Used Psych involvement (Current and /or in the community):  No (Comment)  Discharge Needs  Concerns to be addressed:  Discharge Planning Concerns Readmission within the last 30 days:  Yes Current discharge risk:  None Barriers to Discharge:  No Barriers Identified   Sable Feil, LCSW 03/24/2015, 4:18 PM

## 2015-03-24 NOTE — Care Management Note (Signed)
Case Management Note  Patient Details  Name: Michaela Simpson MRN: [id number] Date of Birth: Nov 09, 1931  Subjective/Objective:                  Patient placed in observation from [facility] SW facilitating discharge.   Action/Plan:  Will DC to SNF today. Expected Discharge Date:                  Expected Discharge Plan:  Skilled Nursing Facility  In-House Referral:  Clinical Social Work  Discharge planning Services  CM Consult  Post Acute Care Choice:    Choice offered to:     DME Arranged:    DME Agency:     HH Arranged:    [place] Agency:     Status of Service:  Completed, signed off  Medicare Important Message Given:    Date Medicare IM Given:    Medicare IM give by:    Date Additional Medicare IM Given:    Additional Medicare Important Message give by:     If discussed at [place] of Stay Meetings, dates discussed:    Additional Comments:  Carles Collet, RN 03/24/2015, 12:34 PM

## 2015-03-24 NOTE — Clinical Social Work Note (Signed)
Michaela Simpson will discharge back to [facility] today to continue her rehab program. Discharge information transmitted to facility and medical packet prepared. Ms. Bronkema informed CSW that she has contacted her 3 children (Cyntia Cresenzo, Dawnn Junius Roads and Ardeen Garland) regarding being discharged today back to [facility]. CSW attempted to reach patient's daughter [name] [phone]) and message left.   Jaxson Keener Givens, MSW, LCSW Licensed Clinical Social Worker [place] [phone]

## 2015-03-25 ENCOUNTER — Other Ambulatory Visit: Payer: Self-pay | Admitting: *Deleted

## 2015-03-25 MED ORDER — TRAMADOL HCL 50 MG PO TABS: ORAL_TABLET | ORAL | Status: DC

## 2015-03-25 MED ORDER — TEMAZEPAM 7.5 MG PO CAPS: 7.5000 mg | ORAL_CAPSULE | Freq: Every evening | ORAL | Status: DC | PRN

## 2015-03-25 NOTE — Telephone Encounter (Signed)
[facility] 

## 2015-03-26 ENCOUNTER — Non-Acute Institutional Stay (SKILLED_NURSING_FACILITY): Payer: Medicare Other | Admitting: Internal Medicine

## 2015-03-26 DIAGNOSIS — S72001D Fracture of unspecified part of neck of right femur, subsequent encounter for closed fracture with routine healing: Secondary | ICD-10-CM

## 2015-03-26 DIAGNOSIS — D62 Acute posthemorrhagic anemia: Secondary | ICD-10-CM | POA: Diagnosis not present

## 2015-03-26 NOTE — Progress Notes (Signed)
Patient ID: Michaela Simpson, female   DOB: 1932-04-17, 81 y.o.   MRN: [id number] Facility; [facility] Chief complaint; review status post a brief admission to hospital 10/1 through 03/24/15 History; this is a patient it originally was in hospital after suffering a right hip fracture. She had an ORIF done on 9/23. On her original admission here on 9/28 I mild edema in the right upper thigh suggestive of blood loss at the surgical site. I ordered a follow-up CBC. Her discharge hemoglobin at 10.1 was down 2 units from perioperatively. Apparently the area continued to enlarge and a follow-up hemoglobin was ordered at 5.7. She was admitted to hospital and transfused 2 units. A duplex ultrasound of the right leg was negative for DVT there was some suggestion of "dark stools" but certainly no evidence of gross melenic hematochezia. She apparently became symptomatically anemic for 2 days. Last hemoglobin from 03/24/15 was 10.6  CBC Latest Ref Rng 03/24/2015 03/23/2015 03/22/2015  WBC 4.0 - 10.5 K/uL 7.6 7.0 7.4  Hemoglobin 12.0 - 15.0 g/dL 10.6(L) 9.5(L) 9.9(L)  Hematocrit 36.0 - 46.0 % 32.5(L) 29.3(L) 30.6(L)  Platelets 150 - 400 K/uL 376 320 345   BMP Latest Ref Rng 03/24/2015 03/23/2015 03/22/2015  Glucose 65 - 99 mg/dL 85 84 95  BUN 6 - 20 mg/dL 14 14 23(H)  Creatinine 0.44 - 1.00 mg/dL 0.79 0.73 0.81  Sodium 135 - 145 mmol/L 136 137 141  Potassium 3.5 - 5.1 mmol/L 4.1 3.4(L) 3.1(L)  Chloride 101 - 111 mmol/L 105 106 108  CO2 22 - 32 mmol/L 23 23 26   Calcium 8.9 - 10.3 mg/dL 9.0 8.6(L) 8.6(L)   Current Outpatient Prescriptions on File Prior to Visit  Medication Sig Dispense Refill  . acetaminophen (TYLENOL) 325 MG tablet Take 2 tablets (650 mg total) by mouth every 6 (six) hours as needed for mild pain (or Fever >/= 101).    Marland Kitchen aspirin EC 325 MG tablet Take 1 tablet (325 mg total) by mouth daily. 30 tablet 0  . Glucosamine-Chondroitin (OSTEO BI-FLEX REGULAR STRENGTH PO) Take 1 tablet by mouth 2 (two)  times daily.    . Multiple Vitamins-Minerals (MULTIVITAMIN WITH MINERALS) tablet Take 1 tablet by mouth daily.    Marland Kitchen PNEUMOCOCCAL 13-VAL CONJ VACC IM Inject into the muscle.    . sertraline (ZOLOFT) 50 MG tablet Take 50 mg by mouth daily.  0  . temazepam (RESTORIL) 7.5 MG capsule Take 1 capsule (7.5 mg total) by mouth at bedtime as needed for sleep. 10 capsule 0  . traMADol (ULTRAM) 50 MG tablet Take one tablet by mouth every 6 hours as needed for pain 120 tablet 0   Past Medical History  Diagnosis Date  . Osteoporosis   . Rheumatoid arthritis [facility])     Past Surgical History  Procedure Laterality Date  . Intramedullary (im) nail intertrochanteric Right 03/14/2015    Procedure: RIGHT INTERTROCHANTRIC INTRAMEDULLARY (IM) NAIL ;  Surgeon: Mcarthur Rossetti, MD;  Location: [place];  Service: Orthopedics;  Laterality: Right;    Review of systems Gen.; states she feels a lot better since her transfusion. Respiratory no cough no shortness of breath Cardiac no chest pain GI no nausea vomiting abdominal pain Melina or him hematochezia GU no voiding difficulties no hematuria Musculoskeletal; states she is able to walk on the right leg with minimal discomfort.  Physical examination Gen. patient appears to be stable bright alert. Respiratory clear entry bilaterally Cardiac to a 6 pansystolic murmur at the lower left  sternal border JVP is not elevated she appears to be euvolemic heart sounds are otherwise normal. Abdomen; no liver no spleen no tenderness no masses. GU bladder not distended there is no CVA tenderness Rectal minimal amount of guaiac-negative stool no lesions felt Extremities; extensive foul bruising now down the entire aspect of her leg even into just above her ankle  Impressions #1 blood loss anemia likely secondary to the surgical site. #2 patient has been transfused 2 units her hemoglobin is stable at 10.6. I will check this again on Monday. #3 her stool was tested  today's guaiac-negative. There is no reason to suspect an alternative site of blood loss. #4 patient has requested, and her daughter is demanding a walker to be left in the patient's room. I will discuss this with therapy, ensure her safety before we allow this. If there is a good reason not to do this then I'll have the therapists call her family member.

## 2015-03-28 ENCOUNTER — Non-Acute Institutional Stay (SKILLED_NURSING_FACILITY): Payer: Medicare Other | Admitting: Internal Medicine

## 2015-03-28 DIAGNOSIS — R21 Rash and other nonspecific skin eruption: Secondary | ICD-10-CM | POA: Diagnosis not present

## 2015-03-28 DIAGNOSIS — D62 Acute posthemorrhagic anemia: Secondary | ICD-10-CM

## 2015-03-28 NOTE — Progress Notes (Signed)
Patient ID: Michaela Simpson, female   DOB: 1932/05/30, 81 y.o.   MRN: [id number]  Facility; [facility] This is an acute visit   Chief complaint--acute visit secondary to small facial rash-follow-up anemia with right leg edema  HPI; this is a patient it originally was in hospital after suffering a right hip fracture. She had an ORIF done on 9/23. On her original admission here on 9/28 I mild edema in the right upper thigh suggestive of blood loss at the surgical site.Dr. Gerrit Halls ordered a follow-up CBC. Her discharge hemoglobin at 10.1 was down 2 units from perioperatively. the area continued to enlarge and a follow-up hemoglobin was ordered at 5.7. She was admitted to hospital and transfused 2 units. A duplex ultrasound of the right leg was negative for DVT there was some suggestion of "dark stools" but certainly no evidence of gross melenic hematochezia. She apparently became symptomatically anemic for 2 days. Last hemoglobin from 03/24/15 was 10.6--she continues to be stable in this regards an updated CBC is pending for Monday.  She has developed a small rash on the left side of her face she feels she was using a different soap and that has caused this-- she says occasionally this will happen-- and rash usually responds to a short course of Benadryl  CBC Latest Ref Rng 03/24/2015 03/23/2015 03/22/2015  WBC 4.0 - 10.5 K/uL 7.6 7.0 7.4  Hemoglobin 12.0 - 15.0 g/dL 10.6(L) 9.5(L) 9.9(L)  Hematocrit 36.0 - 46.0 % 32.5(L) 29.3(L) 30.6(L)  Platelets 150 - 400 K/uL 376 320 345   BMP Latest Ref Rng 03/24/2015 03/23/2015 03/22/2015  Glucose 65 - 99 mg/dL 85 84 95  BUN 6 - 20 mg/dL 14 14 23(H)  Creatinine 0.44 - 1.00 mg/dL 0.79 0.73 0.81  Sodium 135 - 145 mmol/L 136 137 141  Potassium 3.5 - 5.1 mmol/L 4.1 3.4(L) 3.1(L)  Chloride 101 - 111 mmol/L 105 106 108  CO2 22 - 32 mmol/L 23 23 26   Calcium 8.9 - 10.3 mg/dL 9.0 8.6(L) 8.6(L)   Current Outpatient Prescriptions on File Prior to Visit  Medication Sig  Dispense Refill  . acetaminophen (TYLENOL) 325 MG tablet Take 2 tablets (650 mg total) by mouth every 6 (six) hours as needed for mild pain (or Fever >/= 101).    Marland Kitchen aspirin EC 325 MG tablet Take 1 tablet (325 mg total) by mouth daily. 30 tablet 0  . Glucosamine-Chondroitin (OSTEO BI-FLEX REGULAR STRENGTH PO) Take 1 tablet by mouth 2 (two) times daily.    . Multiple Vitamins-Minerals (MULTIVITAMIN WITH MINERALS) tablet Take 1 tablet by mouth daily.    Marland Kitchen PNEUMOCOCCAL 13-VAL CONJ VACC IM Inject into the muscle.    . sertraline (ZOLOFT) 50 MG tablet Take 50 mg by mouth daily.  0  . temazepam (RESTORIL) 7.5 MG capsule Take 1 capsule (7.5 mg total) by mouth at bedtime as needed for sleep. 10 capsule 0  . traMADol (ULTRAM) 50 MG tablet Take one tablet by mouth every 6 hours as needed for pain 120 tablet 0   Past Medical History  Diagnosis Date  . Osteoporosis   . Rheumatoid arthritis [facility])     Past Surgical History  Procedure Laterality Date  . Intramedullary (im) nail intertrochanteric Right 03/14/2015    Procedure: RIGHT INTERTROCHANTRIC INTRAMEDULLARY (IM) NAIL ;  Surgeon: Mcarthur Rossetti, MD;  Location: [place];  Service: Orthopedics;  Laterality: Right;    Review of systems Gen.; continues to feelt better since her transfusion  skin has  a small rash on the left side of her face as noted above. Respiratory no cough no shortness of breath Cardiac no chest pain GI no nausea vomiting abdominal pain Melina or him hematochezia GU no voiding difficulties no hematuria Musculoskeletal; states she is able to walk on the right leg with minimal discomfort.  Physical examination Temperature 98.0 pulse 88 respirations 20 blood pressure 117/57 Gen. patient appears to be stable bright alert. Skin is warm and dry she has a small splotchy-appearing erythematous rash on the left side of her face Respiratory clear entry bilaterally Cardiac 2/ 6 pansystolic murmur at the lower left sternal border  JVP is not elevated she appears to be euvolemic heart sounds are otherwise normal. Abdomen; no liver no spleen no tenderness no masses.   Extremities;  bruising now down the entire aspect of her leg edema appears to be improved from previous exam she has a positive pedal pulse  Assessment plan.  #1-facial rash-will treat with Benadryl 12.5 mg when necessary every 6 hours for 72 hours and monitor-patient states this has worked well in the past.  #2 acute blood loss anemia status post transfusion most recent hemoglobin 10.6 and updated hemoglobin is pending the edema on her right leg appears to be improved from previous exam appears to be stable in this regards.  [id number]  I .

## 2015-03-31 ENCOUNTER — Non-Acute Institutional Stay (SKILLED_NURSING_FACILITY): Payer: Medicare Other | Admitting: Internal Medicine

## 2015-03-31 DIAGNOSIS — S72001D Fracture of unspecified part of neck of right femur, subsequent encounter for closed fracture with routine healing: Secondary | ICD-10-CM | POA: Diagnosis not present

## 2015-03-31 DIAGNOSIS — D62 Acute posthemorrhagic anemia: Secondary | ICD-10-CM | POA: Diagnosis not present

## 2015-03-31 DIAGNOSIS — S72141D Displaced intertrochanteric fracture of right femur, subsequent encounter for closed fracture with routine healing: Secondary | ICD-10-CM | POA: Diagnosis not present

## 2015-04-01 DIAGNOSIS — S72141D Displaced intertrochanteric fracture of right femur, subsequent encounter for closed fracture with routine healing: Secondary | ICD-10-CM | POA: Diagnosis not present

## 2015-04-01 DIAGNOSIS — M810 Age-related osteoporosis without current pathological fracture: Secondary | ICD-10-CM | POA: Diagnosis not present

## 2015-04-01 DIAGNOSIS — M069 Rheumatoid arthritis, unspecified: Secondary | ICD-10-CM | POA: Diagnosis not present

## 2015-04-01 DIAGNOSIS — W19XXXD Unspecified fall, subsequent encounter: Secondary | ICD-10-CM | POA: Diagnosis not present

## 2015-04-01 NOTE — Progress Notes (Signed)
Patient ID: Michaela Simpson, female   DOB: June 22, 1931, 81 y.o.   MRN: [id number]                PROGRESS NOTE  DATE:  03/31/2015          FACILITY: [facility]                  LEVEL OF CARE:   SNF   Acute Visit/Discharge Visit   CHIEF COMPLAINT:  Pre-discharge review.      HISTORY OF PRESENT ILLNESS:  This is a patient who came to [place] after a right hip fracture with an ORIF done on 03/14/2015.     She continued to have perioperative bleeding, unfortunately, which was only recognized once a hemoglobin hit 5.7.  She was readmitted to the hospital and transfused 2 U.  Her hemoglobin a week ago was up to 10.6 and was stable.  Ultrasound was negative for DVT.    She has a history of osteoporosis, although she is on very little for this.    She also has a history of  rheumatoid arthritis.       CURRENT MEDICATIONS:  Discharge medications include:     Zoloft 50 q.d.     Restoril 7.5 q.h.s. p.r.n.      Ultram 50 mg p.o. q.6 hours p.r.n.     Enteric-coated aspirin 325 p.o. q.d.      Osteo Bi-Flex 250/200 b.i.d.        REVIEW OF SYSTEMS:    CHEST/RESPIRATORY:  No shortness of breath.   CARDIAC:  No chest pain.    PHYSICAL EXAMINATION:   SKIN:   INSPECTION:  Extremities:   Her bleeding at the surgical site is resorbing.    ASSESSMENT/PLAN:              Status post right hip fracture.  She has done well.  I saw her up walking with a walker.  Everything looks stable here.  Home health PT and OT.    Perioperative anemia.  Last hemoglobin was 10.6.   I had asked for that to be repeated.  I do not see that result.  However, I think that can be repeated once she gets home by her primary physician.    Osteoporosis.  I have again encouraged her to talk about this with her primary care.     Analgesic medications.  I have written her a script for Ultram 50 p.o. q.6 p.r.n., #20.     CPT CODE: 20947

## 2015-04-02 DIAGNOSIS — M069 Rheumatoid arthritis, unspecified: Secondary | ICD-10-CM | POA: Diagnosis not present

## 2015-04-02 DIAGNOSIS — M810 Age-related osteoporosis without current pathological fracture: Secondary | ICD-10-CM | POA: Diagnosis not present

## 2015-04-02 DIAGNOSIS — S72141D Displaced intertrochanteric fracture of right femur, subsequent encounter for closed fracture with routine healing: Secondary | ICD-10-CM | POA: Diagnosis not present

## 2015-04-02 DIAGNOSIS — W19XXXD Unspecified fall, subsequent encounter: Secondary | ICD-10-CM | POA: Diagnosis not present

## 2015-04-04 DIAGNOSIS — S72141D Displaced intertrochanteric fracture of right femur, subsequent encounter for closed fracture with routine healing: Secondary | ICD-10-CM | POA: Diagnosis not present

## 2015-04-04 DIAGNOSIS — M069 Rheumatoid arthritis, unspecified: Secondary | ICD-10-CM | POA: Diagnosis not present

## 2015-04-04 DIAGNOSIS — W19XXXD Unspecified fall, subsequent encounter: Secondary | ICD-10-CM | POA: Diagnosis not present

## 2015-04-04 DIAGNOSIS — M810 Age-related osteoporosis without current pathological fracture: Secondary | ICD-10-CM | POA: Diagnosis not present

## 2015-04-06 ENCOUNTER — Encounter: Payer: Self-pay | Admitting: Internal Medicine

## 2015-04-06 DIAGNOSIS — R21 Rash and other nonspecific skin eruption: Secondary | ICD-10-CM | POA: Insufficient documentation

## 2015-04-07 DIAGNOSIS — E876 Hypokalemia: Secondary | ICD-10-CM | POA: Diagnosis not present

## 2015-04-07 DIAGNOSIS — D649 Anemia, unspecified: Secondary | ICD-10-CM | POA: Diagnosis not present

## 2015-04-08 DIAGNOSIS — M810 Age-related osteoporosis without current pathological fracture: Secondary | ICD-10-CM | POA: Diagnosis not present

## 2015-04-08 DIAGNOSIS — W19XXXD Unspecified fall, subsequent encounter: Secondary | ICD-10-CM | POA: Diagnosis not present

## 2015-04-08 DIAGNOSIS — M069 Rheumatoid arthritis, unspecified: Secondary | ICD-10-CM | POA: Diagnosis not present

## 2015-04-08 DIAGNOSIS — S72141D Displaced intertrochanteric fracture of right femur, subsequent encounter for closed fracture with routine healing: Secondary | ICD-10-CM | POA: Diagnosis not present

## 2015-04-11 DIAGNOSIS — E876 Hypokalemia: Secondary | ICD-10-CM | POA: Diagnosis not present

## 2015-04-11 DIAGNOSIS — D509 Iron deficiency anemia, unspecified: Secondary | ICD-10-CM | POA: Diagnosis not present

## 2015-04-11 DIAGNOSIS — M069 Rheumatoid arthritis, unspecified: Secondary | ICD-10-CM | POA: Diagnosis not present

## 2015-04-11 DIAGNOSIS — W19XXXD Unspecified fall, subsequent encounter: Secondary | ICD-10-CM | POA: Diagnosis not present

## 2015-04-11 DIAGNOSIS — S72141D Displaced intertrochanteric fracture of right femur, subsequent encounter for closed fracture with routine healing: Secondary | ICD-10-CM | POA: Diagnosis not present

## 2015-04-11 DIAGNOSIS — M810 Age-related osteoporosis without current pathological fracture: Secondary | ICD-10-CM | POA: Diagnosis not present

## 2015-04-15 DIAGNOSIS — S72141D Displaced intertrochanteric fracture of right femur, subsequent encounter for closed fracture with routine healing: Secondary | ICD-10-CM | POA: Diagnosis not present

## 2015-04-15 DIAGNOSIS — W19XXXD Unspecified fall, subsequent encounter: Secondary | ICD-10-CM | POA: Diagnosis not present

## 2015-04-15 DIAGNOSIS — M810 Age-related osteoporosis without current pathological fracture: Secondary | ICD-10-CM | POA: Diagnosis not present

## 2015-04-15 DIAGNOSIS — M069 Rheumatoid arthritis, unspecified: Secondary | ICD-10-CM | POA: Diagnosis not present

## 2015-04-17 DIAGNOSIS — W19XXXD Unspecified fall, subsequent encounter: Secondary | ICD-10-CM | POA: Diagnosis not present

## 2015-04-17 DIAGNOSIS — S72141D Displaced intertrochanteric fracture of right femur, subsequent encounter for closed fracture with routine healing: Secondary | ICD-10-CM | POA: Diagnosis not present

## 2015-04-17 DIAGNOSIS — M810 Age-related osteoporosis without current pathological fracture: Secondary | ICD-10-CM | POA: Diagnosis not present

## 2015-04-17 DIAGNOSIS — M069 Rheumatoid arthritis, unspecified: Secondary | ICD-10-CM | POA: Diagnosis not present

## 2015-05-01 DIAGNOSIS — S72141D Displaced intertrochanteric fracture of right femur, subsequent encounter for closed fracture with routine healing: Secondary | ICD-10-CM | POA: Diagnosis not present

## 2015-08-07 DIAGNOSIS — H40053 Ocular hypertension, bilateral: Secondary | ICD-10-CM | POA: Diagnosis not present

## 2015-08-07 DIAGNOSIS — H40003 Preglaucoma, unspecified, bilateral: Secondary | ICD-10-CM | POA: Diagnosis not present

## 2016-08-03 DIAGNOSIS — H52223 Regular astigmatism, bilateral: Secondary | ICD-10-CM | POA: Diagnosis not present

## 2016-08-03 DIAGNOSIS — H40001 Preglaucoma, unspecified, right eye: Secondary | ICD-10-CM | POA: Diagnosis not present

## 2016-08-03 DIAGNOSIS — H5203 Hypermetropia, bilateral: Secondary | ICD-10-CM | POA: Diagnosis not present

## 2016-08-03 DIAGNOSIS — H524 Presbyopia: Secondary | ICD-10-CM | POA: Diagnosis not present

## 2016-10-05 DIAGNOSIS — F329 Major depressive disorder, single episode, unspecified: Secondary | ICD-10-CM | POA: Diagnosis not present

## 2016-10-05 DIAGNOSIS — L989 Disorder of the skin and subcutaneous tissue, unspecified: Secondary | ICD-10-CM | POA: Diagnosis not present

## 2016-10-05 DIAGNOSIS — R011 Cardiac murmur, unspecified: Secondary | ICD-10-CM | POA: Diagnosis not present

## 2016-10-05 DIAGNOSIS — I10 Essential (primary) hypertension: Secondary | ICD-10-CM | POA: Diagnosis not present

## 2016-10-05 DIAGNOSIS — R42 Dizziness and giddiness: Secondary | ICD-10-CM | POA: Diagnosis not present

## 2016-10-05 DIAGNOSIS — Z6820 Body mass index (BMI) 20.0-20.9, adult: Secondary | ICD-10-CM | POA: Diagnosis not present

## 2016-10-22 DIAGNOSIS — I10 Essential (primary) hypertension: Secondary | ICD-10-CM | POA: Diagnosis not present

## 2016-10-26 DIAGNOSIS — I10 Essential (primary) hypertension: Secondary | ICD-10-CM | POA: Diagnosis not present

## 2016-10-26 DIAGNOSIS — L989 Disorder of the skin and subcutaneous tissue, unspecified: Secondary | ICD-10-CM | POA: Diagnosis not present

## 2016-10-26 DIAGNOSIS — E782 Mixed hyperlipidemia: Secondary | ICD-10-CM | POA: Diagnosis not present

## 2016-10-26 DIAGNOSIS — Z6820 Body mass index (BMI) 20.0-20.9, adult: Secondary | ICD-10-CM | POA: Diagnosis not present

## 2016-10-26 DIAGNOSIS — F329 Major depressive disorder, single episode, unspecified: Secondary | ICD-10-CM | POA: Diagnosis not present

## 2016-10-26 DIAGNOSIS — R944 Abnormal results of kidney function studies: Secondary | ICD-10-CM | POA: Diagnosis not present

## 2016-10-26 DIAGNOSIS — R011 Cardiac murmur, unspecified: Secondary | ICD-10-CM | POA: Diagnosis not present

## 2016-10-26 DIAGNOSIS — R42 Dizziness and giddiness: Secondary | ICD-10-CM | POA: Diagnosis not present

## 2016-11-23 DIAGNOSIS — R944 Abnormal results of kidney function studies: Secondary | ICD-10-CM | POA: Diagnosis not present

## 2016-11-23 DIAGNOSIS — Z681 Body mass index (BMI) 19 or less, adult: Secondary | ICD-10-CM | POA: Diagnosis not present

## 2016-11-23 DIAGNOSIS — I10 Essential (primary) hypertension: Secondary | ICD-10-CM | POA: Diagnosis not present

## 2017-04-26 DIAGNOSIS — Z23 Encounter for immunization: Secondary | ICD-10-CM | POA: Diagnosis not present

## 2017-04-26 DIAGNOSIS — Z6820 Body mass index (BMI) 20.0-20.9, adult: Secondary | ICD-10-CM | POA: Diagnosis not present

## 2017-04-26 DIAGNOSIS — I10 Essential (primary) hypertension: Secondary | ICD-10-CM | POA: Diagnosis not present

## 2017-04-26 DIAGNOSIS — R944 Abnormal results of kidney function studies: Secondary | ICD-10-CM | POA: Diagnosis not present

## 2017-05-26 DIAGNOSIS — R944 Abnormal results of kidney function studies: Secondary | ICD-10-CM | POA: Diagnosis not present

## 2017-05-26 DIAGNOSIS — Z6820 Body mass index (BMI) 20.0-20.9, adult: Secondary | ICD-10-CM | POA: Diagnosis not present

## 2017-05-26 DIAGNOSIS — I10 Essential (primary) hypertension: Secondary | ICD-10-CM | POA: Diagnosis not present

## 2017-05-26 DIAGNOSIS — R21 Rash and other nonspecific skin eruption: Secondary | ICD-10-CM | POA: Diagnosis not present

## 2017-06-23 DIAGNOSIS — I10 Essential (primary) hypertension: Secondary | ICD-10-CM | POA: Diagnosis not present

## 2017-06-23 DIAGNOSIS — Z6820 Body mass index (BMI) 20.0-20.9, adult: Secondary | ICD-10-CM | POA: Diagnosis not present

## 2017-06-23 DIAGNOSIS — R21 Rash and other nonspecific skin eruption: Secondary | ICD-10-CM | POA: Diagnosis not present

## 2017-06-23 DIAGNOSIS — R944 Abnormal results of kidney function studies: Secondary | ICD-10-CM | POA: Diagnosis not present

## 2017-07-19 DIAGNOSIS — K0889 Other specified disorders of teeth and supporting structures: Secondary | ICD-10-CM | POA: Diagnosis not present

## 2017-07-19 DIAGNOSIS — R05 Cough: Secondary | ICD-10-CM | POA: Diagnosis not present

## 2017-09-28 DIAGNOSIS — R05 Cough: Secondary | ICD-10-CM | POA: Diagnosis not present

## 2017-09-28 DIAGNOSIS — K0889 Other specified disorders of teeth and supporting structures: Secondary | ICD-10-CM | POA: Diagnosis not present

## 2017-09-28 DIAGNOSIS — I10 Essential (primary) hypertension: Secondary | ICD-10-CM | POA: Diagnosis not present

## 2017-09-28 DIAGNOSIS — R944 Abnormal results of kidney function studies: Secondary | ICD-10-CM | POA: Diagnosis not present

## 2017-09-28 DIAGNOSIS — Z6820 Body mass index (BMI) 20.0-20.9, adult: Secondary | ICD-10-CM | POA: Diagnosis not present

## 2017-09-28 DIAGNOSIS — R21 Rash and other nonspecific skin eruption: Secondary | ICD-10-CM | POA: Diagnosis not present

## 2017-09-28 DIAGNOSIS — Z23 Encounter for immunization: Secondary | ICD-10-CM | POA: Diagnosis not present

## 2017-10-03 DIAGNOSIS — F39 Unspecified mood [affective] disorder: Secondary | ICD-10-CM | POA: Diagnosis not present

## 2017-10-03 DIAGNOSIS — N183 Chronic kidney disease, stage 3 (moderate): Secondary | ICD-10-CM | POA: Diagnosis not present

## 2017-10-03 DIAGNOSIS — I129 Hypertensive chronic kidney disease with stage 1 through stage 4 chronic kidney disease, or unspecified chronic kidney disease: Secondary | ICD-10-CM | POA: Diagnosis not present

## 2017-10-03 DIAGNOSIS — Z0000 Encounter for general adult medical examination without abnormal findings: Secondary | ICD-10-CM | POA: Diagnosis not present

## 2017-10-03 DIAGNOSIS — E782 Mixed hyperlipidemia: Secondary | ICD-10-CM | POA: Diagnosis not present

## 2018-02-28 DIAGNOSIS — I872 Venous insufficiency (chronic) (peripheral): Secondary | ICD-10-CM | POA: Diagnosis not present

## 2018-02-28 DIAGNOSIS — N183 Chronic kidney disease, stage 3 (moderate): Secondary | ICD-10-CM | POA: Diagnosis not present

## 2018-02-28 DIAGNOSIS — R600 Localized edema: Secondary | ICD-10-CM | POA: Diagnosis not present

## 2018-02-28 DIAGNOSIS — I129 Hypertensive chronic kidney disease with stage 1 through stage 4 chronic kidney disease, or unspecified chronic kidney disease: Secondary | ICD-10-CM | POA: Diagnosis not present

## 2018-02-28 DIAGNOSIS — Z6820 Body mass index (BMI) 20.0-20.9, adult: Secondary | ICD-10-CM | POA: Diagnosis not present

## 2018-04-11 DIAGNOSIS — K0889 Other specified disorders of teeth and supporting structures: Secondary | ICD-10-CM | POA: Diagnosis not present

## 2018-04-11 DIAGNOSIS — I10 Essential (primary) hypertension: Secondary | ICD-10-CM | POA: Diagnosis not present

## 2018-04-11 DIAGNOSIS — F39 Unspecified mood [affective] disorder: Secondary | ICD-10-CM | POA: Diagnosis not present

## 2018-04-11 DIAGNOSIS — N183 Chronic kidney disease, stage 3 (moderate): Secondary | ICD-10-CM | POA: Diagnosis not present

## 2018-04-11 DIAGNOSIS — E782 Mixed hyperlipidemia: Secondary | ICD-10-CM | POA: Diagnosis not present

## 2018-04-11 DIAGNOSIS — I872 Venous insufficiency (chronic) (peripheral): Secondary | ICD-10-CM | POA: Diagnosis not present

## 2018-04-11 DIAGNOSIS — R600 Localized edema: Secondary | ICD-10-CM | POA: Diagnosis not present

## 2018-04-11 DIAGNOSIS — R05 Cough: Secondary | ICD-10-CM | POA: Diagnosis not present

## 2018-04-11 DIAGNOSIS — Z6820 Body mass index (BMI) 20.0-20.9, adult: Secondary | ICD-10-CM | POA: Diagnosis not present

## 2018-04-11 DIAGNOSIS — I129 Hypertensive chronic kidney disease with stage 1 through stage 4 chronic kidney disease, or unspecified chronic kidney disease: Secondary | ICD-10-CM | POA: Diagnosis not present

## 2018-04-11 DIAGNOSIS — R21 Rash and other nonspecific skin eruption: Secondary | ICD-10-CM | POA: Diagnosis not present

## 2018-04-11 DIAGNOSIS — R944 Abnormal results of kidney function studies: Secondary | ICD-10-CM | POA: Diagnosis not present

## 2018-04-14 DIAGNOSIS — N183 Chronic kidney disease, stage 3 (moderate): Secondary | ICD-10-CM | POA: Diagnosis not present

## 2018-04-14 DIAGNOSIS — F331 Major depressive disorder, recurrent, moderate: Secondary | ICD-10-CM | POA: Diagnosis not present

## 2018-04-14 DIAGNOSIS — Z6820 Body mass index (BMI) 20.0-20.9, adult: Secondary | ICD-10-CM | POA: Diagnosis not present

## 2018-04-14 DIAGNOSIS — I129 Hypertensive chronic kidney disease with stage 1 through stage 4 chronic kidney disease, or unspecified chronic kidney disease: Secondary | ICD-10-CM | POA: Diagnosis not present

## 2018-04-14 DIAGNOSIS — Z23 Encounter for immunization: Secondary | ICD-10-CM | POA: Diagnosis not present

## 2018-04-14 DIAGNOSIS — M25542 Pain in joints of left hand: Secondary | ICD-10-CM | POA: Diagnosis not present

## 2018-04-14 DIAGNOSIS — M79641 Pain in right hand: Secondary | ICD-10-CM | POA: Diagnosis not present

## 2018-04-14 DIAGNOSIS — E782 Mixed hyperlipidemia: Secondary | ICD-10-CM | POA: Diagnosis not present

## 2018-04-20 DIAGNOSIS — M25531 Pain in right wrist: Secondary | ICD-10-CM | POA: Diagnosis not present

## 2018-04-20 DIAGNOSIS — M25542 Pain in joints of left hand: Secondary | ICD-10-CM | POA: Diagnosis not present

## 2018-04-20 DIAGNOSIS — M25532 Pain in left wrist: Secondary | ICD-10-CM | POA: Diagnosis not present

## 2018-04-20 DIAGNOSIS — M25541 Pain in joints of right hand: Secondary | ICD-10-CM | POA: Diagnosis not present

## 2018-04-24 DIAGNOSIS — M25542 Pain in joints of left hand: Secondary | ICD-10-CM | POA: Diagnosis not present

## 2018-04-24 DIAGNOSIS — M25532 Pain in left wrist: Secondary | ICD-10-CM | POA: Diagnosis not present

## 2018-04-24 DIAGNOSIS — M25541 Pain in joints of right hand: Secondary | ICD-10-CM | POA: Diagnosis not present

## 2018-04-24 DIAGNOSIS — M25531 Pain in right wrist: Secondary | ICD-10-CM | POA: Diagnosis not present

## 2018-05-15 DIAGNOSIS — G5603 Carpal tunnel syndrome, bilateral upper limbs: Secondary | ICD-10-CM

## 2018-05-15 DIAGNOSIS — M79642 Pain in left hand: Secondary | ICD-10-CM | POA: Diagnosis not present

## 2018-05-15 DIAGNOSIS — M79641 Pain in right hand: Secondary | ICD-10-CM | POA: Diagnosis not present

## 2018-05-15 DIAGNOSIS — M65839 Other synovitis and tenosynovitis, unspecified forearm: Secondary | ICD-10-CM | POA: Diagnosis not present

## 2018-05-15 HISTORY — DX: Carpal tunnel syndrome, bilateral upper limbs: G56.03

## 2018-06-22 DIAGNOSIS — M65839 Other synovitis and tenosynovitis, unspecified forearm: Secondary | ICD-10-CM | POA: Diagnosis not present

## 2018-06-22 DIAGNOSIS — M79642 Pain in left hand: Secondary | ICD-10-CM | POA: Diagnosis not present

## 2018-06-22 DIAGNOSIS — M79641 Pain in right hand: Secondary | ICD-10-CM | POA: Diagnosis not present

## 2018-06-22 DIAGNOSIS — G5603 Carpal tunnel syndrome, bilateral upper limbs: Secondary | ICD-10-CM | POA: Diagnosis not present

## 2018-07-13 DIAGNOSIS — S81811A Laceration without foreign body, right lower leg, initial encounter: Secondary | ICD-10-CM | POA: Diagnosis not present

## 2018-07-13 DIAGNOSIS — L03115 Cellulitis of right lower limb: Secondary | ICD-10-CM | POA: Diagnosis not present

## 2018-07-20 DIAGNOSIS — S81811D Laceration without foreign body, right lower leg, subsequent encounter: Secondary | ICD-10-CM | POA: Diagnosis not present

## 2018-07-27 DIAGNOSIS — S81801D Unspecified open wound, right lower leg, subsequent encounter: Secondary | ICD-10-CM | POA: Diagnosis not present

## 2018-08-03 ENCOUNTER — Ambulatory Visit (HOSPITAL_COMMUNITY): Payer: Medicare Other | Attending: Internal Medicine | Admitting: Physical Therapy

## 2018-08-03 ENCOUNTER — Other Ambulatory Visit: Payer: Self-pay

## 2018-08-03 DIAGNOSIS — M79661 Pain in right lower leg: Secondary | ICD-10-CM | POA: Insufficient documentation

## 2018-08-03 DIAGNOSIS — S81801S Unspecified open wound, right lower leg, sequela: Secondary | ICD-10-CM | POA: Diagnosis not present

## 2018-08-03 NOTE — Therapy (Addendum)
[facility] [place], [name], [address] Phone: [phone]   Fax:  [phone]  Wound Care Evaluation  Patient Details  Name: Michaela Simpson MRN: [id number] Date of Birth: 08-28-1931 Referring Provider (PT): Dr. Allyn Kenner   Encounter Date: 08/03/2018  PT End of Session - 08/03/18 1254    Visit Number  1    Number of Visits  8    Date for PT Re-Evaluation  09/02/18    PT Start Time  0945    PT Stop Time  1020    PT Time Calculation (min)  35 min    Activity Tolerance  Patient tolerated treatment well    Behavior During Therapy  [facility] for tasks assessed/performed       Past Medical History:  Diagnosis Date  . Osteoporosis   . Rheumatoid arthritis [facility])     Past Surgical History:  Procedure Laterality Date  . INTRAMEDULLARY (IM) NAIL INTERTROCHANTERIC Right 03/14/2015   Procedure: RIGHT INTERTROCHANTRIC INTRAMEDULLARY (IM) NAIL ;  Surgeon: Mcarthur Rossetti, MD;  Location: [place];  Service: Orthopedics;  Laterality: Right;    There were no vitals filed for this visit.    [facility] PT Assessment - 08/03/18 0001      Assessment   Medical Diagnosis  Non-healing Rt LE wound     Referring Provider (PT)  Dr. Allyn Kenner    Onset Date/Surgical Date  05/05/18    Next MD Visit  not scheduled     Prior Therapy  self care.       Precautions   Precautions  --   cellulitis     Restrictions   Weight Bearing Restrictions  No      Balance Screen   Has the patient fallen in the past 6 months  No    Has the patient had a decrease in activity level because of a fear of falling?   Yes    Is the patient reluctant to leave their home because of a fear of falling?   No      Home Environment   Living Environment  Private residence      Prior Function   Level of Independence  Independent      Cognition   Overall Cognitive Status  Within Functional Limits for tasks assessed      Wound Therapy - 08/03/18 0942    Subjective  PT states that she was on her patio and her leg was scratched by a stick.  She completed self care of dressing changes and neosporin but the wound continued to progress in nature.  She went to the MD who placed her on antibiotics but the wound continues not to heal.     Patient and Family Stated Goals  wound to heal     Date of Onset  05/05/18    Prior Treatments  self care, antibiotics     Pain Scale  0-10    Pain Score  7     Pain Type  Chronic pain    Pain Location  Leg    Pain Orientation  Right    Pain Descriptors / Indicators  Aching;Penetrating    Pain Onset  With Activity    Patients Stated Pain Goal  0    Pain Intervention(s)  Emotional support    Multiple Pain Sites  No    Evaluation and Treatment Procedures Explained to Patient/Family  Yes    Evaluation and Treatment Procedures  agreed to  Wound Properties Date First Assessed: 08/03/18 Time First Assessed: 0949 Wound Type: Puncture Location: Leg Location Orientation: Right Present on Admission: Yes   Dressing Type  Pressure dressing    Dressing Changed  Changed    Dressing Status  Old drainage    Dressing Change Frequency  PRN    Site / Wound Assessment  Black;Dusky    % Wound base Red or Granulating  10%    % Wound base Black/Eschar  90%    Peri-wound Assessment  Edema;Erythema (blanchable)   redness extends beyond 2 cm of  the perimeter of the wound.    Wound Length (cm)  3.2 cm    Wound Width (cm)  2.5 cm    Wound Depth (cm)  0.2 cm    Wound Volume (cm^3)  1.6 cm^3    Wound Surface Area (cm^2)  8 cm^2    Drainage Amount  Scant    Treatment  Cleansed;Debridement (Selective);Other (Comment)    Selective Debridement - Location  entire wound base    Selective Debridement - Tools Used  Forceps;Scalpel;Scissors    Selective Debridement - Tissue Removed  slough and devitalized tissues     Wound Therapy - Clinical Statement  Ms. Herdt scratched her Rt leg on a plant in November.  She developed a wound which  has been progressive in nature.  She completed self care for three weeks and then went to the MD who has been treating it and gave her a prescription of antibiotics which she has completed.  The wound is still progressing therefore she has been referred to skilled physical therapy for education and to create a healing environment for her wound to reduce cellulitis.     Wound Therapy - Frequency  2X / week   x 4 weeks    Wound Therapy - Current Recommendations  PT    Wound Plan  PT to be seen for cleansing, debridement and proper dressing to maintain a healing environment.     Dressing   honey followed by 4x4 and profore lite.              Objective measurements completed on examination: See above findings.            PT Education - 08/03/18 1253    Education Details  Keep dressing dry, if any pain remove first layer of dressing.     Person(s) Educated  Patient    Methods  Explanation    Comprehension  Verbalized understanding       PT Short Term Goals - 08/03/18 1258      PT SHORT TERM GOAL #1   Title  PT wound to be 100% granulated to decrease cellulitis    Time  2    Period  Weeks    Status  New    Target Date  08/17/18      PT SHORT TERM GOAL #2   Title  Pt pain to be no greater than a 4/10 to allow pt to be able to tolerate standing/walking for up  to 30 minutes without increased pain for improved functional ability.     Time  2    Period  Weeks    Status  New        PT Long Term Goals - 08/03/18 1259      PT LONG TERM GOAL #1   Title  Wound size to be decreased to no greater than 1.5x1.5 cm to allow pt to feel comfortable to complete self  care for wound.     Time  4    Period  Weeks    Status  New    Target Date  08/31/18      PT LONG TERM GOAL #2   Title  Pt to be able to vocalize the benefits of wearing compression garments for varicosities.     Time  4    Period  Weeks    Status  New           Plan - 08/03/18 1256    Clinical  Impression Statement  as above     History and Personal Factors relevant to plan of care:  varicosities     Clinical Presentation  Evolving    Clinical Decision Making  Low    Rehab Potential  Good    PT Frequency  2x / week    PT Duration  4 weeks    PT Treatment/Interventions  ADLs/Self Care Home Management;Patient/family education;Other (comment)   debridement and dressing change    PT Next Visit Plan  Assess how profore lite did for pt.  Continue cleansing, debridement and dressing change.        Patient will benefit from skilled therapeutic intervention in order to improve the following deficits and impairments:  Decreased skin integrity, Pain  Visit Diagnosis: Pain in right lower leg  Open leg wound, right, sequela    Problem List Patient Active Problem List   Diagnosis Date Noted  . Rash and nonspecific skin eruption 04/06/2015  . Acute blood loss anemia 03/22/2015  . Edema 03/21/2015  . Acute renal failure syndrome ([place])   . Closed right hip fracture ([place]) 03/14/2015  . Rheumatoid arthritis ([place]) 03/14/2015  . Fall 03/14/2015   Rayetta Humphrey, [place] CLT [phone] 08/03/2018, 1:04 PM     [facility] [address] [place], [name], [address] Phone: [phone]   Fax:  [phone]  Name: Michaela Simpson MRN: [id number] Date of Birth: [birth year]

## 2018-08-08 ENCOUNTER — Ambulatory Visit (HOSPITAL_COMMUNITY): Payer: Medicare Other | Admitting: Physical Therapy

## 2018-08-08 DIAGNOSIS — M79661 Pain in right lower leg: Secondary | ICD-10-CM

## 2018-08-08 DIAGNOSIS — S81801S Unspecified open wound, right lower leg, sequela: Secondary | ICD-10-CM | POA: Diagnosis not present

## 2018-08-08 NOTE — Therapy (Signed)
[facility] [place], [name], [address] Phone: [phone]   Fax:  [phone]  Wound Care Therapy  Patient Details  Name: Michaela Simpson MRN: [id number] Date of Birth: 1932/02/23 Referring Provider (PT): Dr. Allyn Kenner   Encounter Date: 08/08/2018  PT End of Session - 08/08/18 1113    Visit Number  2    Number of Visits  8    Date for PT Re-Evaluation  09/02/18    PT Start Time  0955    PT Stop Time  1030    PT Time Calculation (min)  35 min    Activity Tolerance  Patient tolerated treatment well    Behavior During Therapy  [facility] for tasks assessed/performed       Past Medical History:  Diagnosis Date  . Osteoporosis   . Rheumatoid arthritis [facility])     Past Surgical History:  Procedure Laterality Date  . INTRAMEDULLARY (IM) NAIL INTERTROCHANTERIC Right 03/14/2015   Procedure: RIGHT INTERTROCHANTRIC INTRAMEDULLARY (IM) NAIL ;  Surgeon: Mcarthur Rossetti, MD;  Location: [facility];  Service: Orthopedics;  Laterality: Right;    There were no vitals filed for this visit.              Wound Therapy - 08/08/18 1108    Subjective  pt states she is doing well today, the stinging has let up significantly.     Patient and Family Stated Goals  wound to heal     Date of Onset  05/05/18    Prior Treatments  self care, antibiotics     Pain Scale  0-10    Pain Score  4     Pain Type  Chronic pain    Pain Location  Leg    Pain Orientation  Right    Pain Descriptors / Indicators  Aching;Burning    Evaluation and Treatment Procedures Explained to Patient/Family  Yes    Evaluation and Treatment Procedures  agreed to    Wound Properties Date First Assessed: 08/03/18 Time First Assessed: 0949 Wound Type: Puncture Location: Leg Location Orientation: Right Present on Admission: Yes   Dressing Type  Pressure dressing    Dressing Changed  Changed    Dressing Status  Old drainage    Dressing Change Frequency  PRN    Site / Wound Assessment  Black;Dusky    % Wound base Red or Granulating  75%    % Wound base Black/Eschar  25%    Peri-wound Assessment  Edema;Erythema (blanchable)   redness extends beyond 2 cm of  the perimeter of the wound.    Wound Length (cm)  3 cm    Wound Width (cm)  1.7 cm    Wound Depth (cm)  0.1 cm    Wound Volume (cm^3)  0.51 cm^3    Wound Surface Area (cm^2)  5.1 cm^2    Drainage Amount  Scant    Treatment  Cleansed;Debridement (Selective)    Selective Debridement - Location  entire wound base    Selective Debridement - Tools Used  Forceps;Scalpel;Scissors    Selective Debridement - Tissue Removed  slough and devitalized tissues     Wound Therapy - Clinical Statement  pt returns today with dressings intact, reporting much imprrovement in discomfort.  Able to debride large amount of devitiatlized tissue and slough to reveal overall improvement in granulation, with only cratered areas remaining with slough.  measured with approximation noted as well.  Cleansed wound and peimeter well, moisturized  and contineud with medihoney gel/profore.  may be able to change dressing to xeroform and reduce to 1X week next visit with continued improvements.      Wound Therapy - Frequency  2X / week   x 4 weeks    Wound Therapy - Current Recommendations  PT    Wound Plan  PT to be seen for cleansing, debridement and proper dressing to maintain a healing environment. To assess for change of dressing and reduced frequency next session.     Dressing   honey followed by 4x4 and profore lite.                 PT Short Term Goals - 08/03/18 1258      PT SHORT TERM GOAL #1   Title  PT wound to be 100% granulated to decrease cellulitis    Time  2    Period  Weeks    Status  New    Target Date  08/17/18      PT SHORT TERM GOAL #2   Title  Pt pain to be no greater than a 4/10 to allow pt to be able to tolerate standing/walking for up  to 30 minutes without increased pain for improved  functional ability.     Time  2    Period  Weeks    Status  New        PT Long Term Goals - 08/03/18 1259      PT LONG TERM GOAL #1   Title  Wound size to be decreased to no greater than 1.5x1.5 cm to allow pt to feel comfortable to complete self care for wound.     Time  4    Period  Weeks    Status  New    Target Date  08/31/18      PT LONG TERM GOAL #2   Title  Pt to be able to vocalize the benefits of wearing compression garments for varicosities.     Time  4    Period  Weeks    Status  New              Patient will benefit from skilled therapeutic intervention in order to improve the following deficits and impairments:     Visit Diagnosis: Pain in right lower leg  Open leg wound, right, sequela     Problem List Patient Active Problem List   Diagnosis Date Noted  . Rash and nonspecific skin eruption 04/06/2015  . Acute blood loss anemia 03/22/2015  . Edema 03/21/2015  . Acute renal failure syndrome ([place])   . Closed right hip fracture ([place]) 03/14/2015  . Rheumatoid arthritis ([place]) 03/14/2015  . Fall 03/14/2015   Teena Irani, PTA/CLT [phone]  Teena Irani 08/08/2018, [place], [name], [address] Phone: [phone]   Fax:  [phone]  Name: Michaela Simpson MRN: [id number] Date of Birth: 1932/04/01

## 2018-08-09 ENCOUNTER — Telehealth (HOSPITAL_COMMUNITY): Payer: Self-pay | Admitting: Internal Medicine

## 2018-08-09 NOTE — Telephone Encounter (Signed)
08/09/18  pt called and asked that cx this appt only - conflict with another appt.

## 2018-08-10 ENCOUNTER — Ambulatory Visit (HOSPITAL_COMMUNITY): Payer: Medicare Other | Admitting: Physical Therapy

## 2018-08-15 ENCOUNTER — Encounter (HOSPITAL_COMMUNITY): Payer: Self-pay | Admitting: Physical Therapy

## 2018-08-15 ENCOUNTER — Ambulatory Visit (HOSPITAL_COMMUNITY): Payer: Medicare Other | Admitting: Physical Therapy

## 2018-08-15 DIAGNOSIS — M79661 Pain in right lower leg: Secondary | ICD-10-CM | POA: Diagnosis not present

## 2018-08-15 DIAGNOSIS — S81801S Unspecified open wound, right lower leg, sequela: Secondary | ICD-10-CM | POA: Diagnosis not present

## 2018-08-15 NOTE — Therapy (Signed)
[place], [name], [address] Phone: [phone]   Fax:  [phone]  Wound Care Therapy  Patient Details  Name: Michaela Simpson MRN: [id number] Date of Birth: 1931/07/13 Referring Provider (PT): Dr. Allyn Kenner   Encounter Date: 08/15/2018  PT End of Session - 08/15/18 1029    Visit Number  3    Number of Visits  8    Date for PT Re-Evaluation  09/02/18    PT Start Time  0945    PT Stop Time  1019    PT Time Calculation (min)  34 min    Activity Tolerance  Patient tolerated treatment well    Behavior During Therapy  [facility] for tasks assessed/performed       Past Medical History:  Diagnosis Date  . Osteoporosis   . Rheumatoid arthritis [facility])     Past Surgical History:  Procedure Laterality Date  . INTRAMEDULLARY (IM) NAIL INTERTROCHANTERIC Right 03/14/2015   Procedure: RIGHT INTERTROCHANTRIC INTRAMEDULLARY (IM) NAIL ;  Surgeon: Mcarthur Rossetti, MD;  Location: [place];  Service: Orthopedics;  Laterality: Right;    There were no vitals filed for this visit.              Wound Therapy - 08/15/18 1019    Subjective  Pt states that she is anxious to see her wound     Patient and Family Stated Goals  wound to heal     Date of Onset  05/05/18    Prior Treatments  self care, antibiotics     Pain Scale  0-10    Pain Score  2     Pain Type  Chronic pain    Pain Location  Leg    Pain Orientation  Right    Pain Descriptors / Indicators  Jabbing;Throbbing    Pain Onset  Other (Comment)   when it is touched    Patients Stated Pain Goal  0    Pain Intervention(s)  Emotional support    Multiple Pain Sites  No    Evaluation and Treatment Procedures Explained to Patient/Family  Yes    Evaluation and Treatment Procedures  agreed to    Wound Properties Date First Assessed: 08/03/18 Time First Assessed: 0949 Wound Type: Puncture Location: Leg Location Orientation: Right Present on Admission: Yes   Dressing Type  Pressure dressing    Dressing Changed  Changed    Dressing Status  Old drainage    Dressing Change Frequency  PRN    Site / Wound Assessment  Granulation tissue    % Wound base Red or Granulating  85%    % Wound base Black/Eschar  15%    Peri-wound Assessment  Edema;Erythema (blanchable)   redness extends 2 cm medially; 2.5 cm infer and laterally    Wound Length (cm)  2.7 cm    Wound Width (cm)  1.7 cm    Wound Depth (cm)  --   varies from .1 to 0 to hypergranulation.    Wound Surface Area (cm^2)  4.59 cm^2    Drainage Amount  Minimal    Treatment  Cleansed;Debridement (Selective)    Selective Debridement - Location  areas in wound base that has slough and devitalized tissue.     Selective Debridement - Tools Used  Forceps    Selective Debridement - Tissue Removed  slough and devitalized tissues     Wound Therapy - Clinical Statement  Pt wound assessed and continues to decrease  in size.  Areas of the wound are now hypergranulated therefore 1/4" foam was placed over 2x2 covering wound to add pressure.  Pt continues to be very nervous about wound; therapist explained that as long as the wound is improving each time we see it we are on the right path.  If redness around perimeter of wound has not decreased in size MD should be notified to see if he feels another round of antibiotic would be beneficial.  Overall good healing is occuring.  PT  encouraged pt to wear compression garments following treatment for her varicosities.     Wound Therapy - Frequency  2X / week   x 4 weeks    Wound Therapy - Current Recommendations  PT    Wound Plan  PT to be seen for cleansing, debridement and proper dressing to maintain a healing environment.  If redness around wound has decreased may decrease to one time a week.     Dressing   honey followed by 2x2, foak  and profore lite.                 PT Short Term Goals - 08/15/18 1031      PT SHORT TERM GOAL #1   Title  PT wound to be  100% granulated to decrease cellulitis    Time  2    Period  Weeks    Status  Partially Met    Target Date  08/17/18      PT SHORT TERM GOAL #2   Title  Pt pain to be no greater than a 4/10 to allow pt to be able to tolerate standing/walking for up  to 30 minutes without increased pain for improved functional ability.     Time  2    Period  Weeks    Status  Achieved        PT Long Term Goals - 08/15/18 1031      PT LONG TERM GOAL #1   Title  Wound size to be decreased to no greater than 1.5x1.5 cm to allow pt to feel comfortable to complete self care for wound.     Time  4    Period  Weeks    Status  On-going      PT LONG TERM GOAL #2   Title  Pt to be able to vocalize the benefits of wearing compression garments for varicosities.     Time  4    Period  Weeks    Status  On-going            Plan - 08/15/18 1030    Clinical Impression Statement  as above     Rehab Potential  Good    PT Frequency  2x / week    PT Duration  4 weeks    PT Treatment/Interventions  ADLs/Self Care Home Management;Patient/family education;Other (comment)   debridement and dressing change    PT Next Visit Plan  Assess if foam stopped hypergranulation.   Continue cleansing, debridement and dressing change.        Patient will benefit from skilled therapeutic intervention in order to improve the following deficits and impairments:  Decreased skin integrity, Pain  Visit Diagnosis: Pain in right lower leg  Open leg wound, right, sequela     Problem List Patient Active Problem List   Diagnosis Date Noted  . Rash and nonspecific skin eruption 04/06/2015  . Acute blood loss anemia 03/22/2015  . Edema 03/21/2015  . Acute renal  failure syndrome ([place])   . Closed right hip fracture ([place]) 03/14/2015  . Rheumatoid arthritis ([place]) 03/14/2015  . Fall 03/14/2015    Rayetta Humphrey, PT CLT [phone] 08/15/2018, 10:32 AM  [facility] [address] [place], [name], [address] Phone: [phone]   Fax:  [phone]  Name: Michaela Simpson MRN: [id number] Date of Birth: 03/26/1932

## 2018-08-16 ENCOUNTER — Ambulatory Visit (HOSPITAL_COMMUNITY): Payer: Medicare Other

## 2018-08-18 ENCOUNTER — Ambulatory Visit (HOSPITAL_COMMUNITY): Payer: Medicare Other | Admitting: Physical Therapy

## 2018-08-18 DIAGNOSIS — M79661 Pain in right lower leg: Secondary | ICD-10-CM | POA: Diagnosis not present

## 2018-08-18 DIAGNOSIS — S81801S Unspecified open wound, right lower leg, sequela: Secondary | ICD-10-CM | POA: Diagnosis not present

## 2018-08-18 NOTE — Addendum Note (Signed)
Addended by: Leeroy Cha on: 08/18/2018 12:56 PM   Modules accepted: Orders

## 2018-08-18 NOTE — Therapy (Signed)
[place], [name], [address] Phone: [phone]   Fax:  [phone]  Wound Care Therapy  Patient Details  Name: Michaela Simpson MRN: [id number] Date of Birth: 1931/09/14 Referring Provider (PT): Dr. Allyn Kenner   Encounter Date: 08/18/2018  PT End of Session - 08/18/18 1109    Visit Number  4    Number of Visits  8    Date for PT Re-Evaluation  09/02/18    PT Start Time  1020    PT Stop Time  1050    PT Time Calculation (min)  30 min    Activity Tolerance  Patient tolerated treatment well    Behavior During Therapy  [facility] for tasks assessed/performed       Past Medical History:  Diagnosis Date  . Osteoporosis   . Rheumatoid arthritis [facility])     Past Surgical History:  Procedure Laterality Date  . INTRAMEDULLARY (IM) NAIL INTERTROCHANTERIC Right 03/14/2015   Procedure: RIGHT INTERTROCHANTRIC INTRAMEDULLARY (IM) NAIL ;  Surgeon: Mcarthur Rossetti, MD;  Location: [place];  Service: Orthopedics;  Laterality: Right;    There were no vitals filed for this visit.              Wound Therapy - 08/18/18 1101    Subjective  pt states her leg throbbed and hurt worse after last session.  States it just started feeling better this morning.     Patient and Family Stated Goals  wound to heal     Date of Onset  05/05/18    Prior Treatments  self care, antibiotics     Pain Scale  0-10    Pain Score  2     Pain Type  Acute pain    Pain Location  Leg    Pain Orientation  Right    Pain Descriptors / Indicators  Throbbing    Evaluation and Treatment Procedures Explained to Patient/Family  Yes    Evaluation and Treatment Procedures  agreed to    Wound Properties Date First Assessed: 08/03/18 Time First Assessed: 0949 Wound Type: Puncture Location: Leg Location Orientation: Right Present on Admission: Yes   Dressing Type  Compression wrap    Dressing Changed  Changed    Dressing Status  Old drainage    Dressing Change Frequency  PRN    Site / Wound Assessment  Granulation tissue    % Wound base Red or Granulating  95%   following debridement   % Wound base Black/Eschar  5%    Peri-wound Assessment  Edema;Erythema (blanchable)   redness extends 2 cm medially; 2.5 cm infer and laterally    Wound Length (cm)  3.4 cm    Wound Width (cm)  2.1 cm    Wound Depth (cm)  0 cm    Wound Volume (cm^3)  0 cm^3    Wound Surface Area (cm^2)  7.14 cm^2    Undermining (cm)  2cm redness ring around wound marked with skin marker    Margins  Attached edges (approximated)    Drainage Amount  Minimal    Drainage Description  Serosanguineous    Treatment  Cleansed;Debridement (Selective)    Selective Debridement - Location  areas in wound base that has slough and devitalized tissue.     Selective Debridement - Tools Used  Forceps    Selective Debridement - Tissue Removed  slough and devitalized tissues     Wound Therapy - Clinical Statement  increased serosanginous  drainage today as well as redness covering 2cm perimeter of wound.  Debrided all dry edges and remaining slough central wound with resultant 95% granulation.  measured with increase in size this session as compared to last visit.  Although with increased redness, no other signs/symptoms of infection.  no longer with hypergranluation.  changed dressing to xeroform as no longer requires medihoney. Outlined redness with skin marker to compare to next visit.  cleansed and moisturized LE well. Continued with profore lite.  Pt reported no return of pain or throbbing at end of session.      Wound Therapy - Frequency  2X / week   x 4 weeks    Wound Therapy - Current Recommendations  PT    Wound Plan  PT to be seen for cleansing, debridement and proper dressing to maintain a healing environment.  check redness around wound to ensure does not go past drawn borders. Change dressing as conducive to wound environment.     Dressing   honey followed by 2x2, foak  and  profore lite.                 PT Short Term Goals - 08/15/18 1031      PT SHORT TERM GOAL #1   Title  PT wound to be 100% granulated to decrease cellulitis    Time  2    Period  Weeks    Status  Partially Met    Target Date  08/17/18      PT SHORT TERM GOAL #2   Title  Pt pain to be no greater than a 4/10 to allow pt to be able to tolerate standing/walking for up  to 30 minutes without increased pain for improved functional ability.     Time  2    Period  Weeks    Status  Achieved        PT Long Term Goals - 08/15/18 1031      PT LONG TERM GOAL #1   Title  Wound size to be decreased to no greater than 1.5x1.5 cm to allow pt to feel comfortable to complete self care for wound.     Time  4    Period  Weeks    Status  On-going      PT LONG TERM GOAL #2   Title  Pt to be able to vocalize the benefits of wearing compression garments for varicosities.     Time  4    Period  Weeks    Status  On-going              Patient will benefit from skilled therapeutic intervention in order to improve the following deficits and impairments:     Visit Diagnosis: Pain in right lower leg  Open leg wound, right, sequela     Problem List Patient Active Problem List   Diagnosis Date Noted  . Rash and nonspecific skin eruption 04/06/2015  . Acute blood loss anemia 03/22/2015  . Edema 03/21/2015  . Acute renal failure syndrome ([place])   . Closed right hip fracture ([place]) 03/14/2015  . Rheumatoid arthritis ([place]) 03/14/2015  . Fall 03/14/2015   Teena Irani, PTA/CLT [phone]  Teena Irani 08/18/2018, 11:10 AM  [place], [name], [address] Phone: [phone]   Fax:  [phone]  Name: Michaela Simpson MRN: [id number] Date of Birth: February 20, 1932

## 2018-08-22 ENCOUNTER — Ambulatory Visit (HOSPITAL_COMMUNITY): Payer: Medicare Other | Attending: Internal Medicine | Admitting: Physical Therapy

## 2018-08-22 DIAGNOSIS — S81801S Unspecified open wound, right lower leg, sequela: Secondary | ICD-10-CM | POA: Insufficient documentation

## 2018-08-22 DIAGNOSIS — M79661 Pain in right lower leg: Secondary | ICD-10-CM | POA: Diagnosis not present

## 2018-08-22 NOTE — Therapy (Signed)
[place], [name], [address] Phone: [phone]   Fax:  [phone]  Wound Care Therapy  Patient Details  Name: Michaela Simpson MRN: [id number] Date of Birth: May 14, 1932 Referring Provider (PT): Dr. Allyn Kenner   Encounter Date: 08/22/2018  PT End of Session - 08/22/18 1036    Visit Number  5    Number of Visits  8    Date for PT Re-Evaluation  09/02/18    PT Start Time  0945    PT Stop Time  1015    PT Time Calculation (min)  30 min    Activity Tolerance  Patient tolerated treatment well    Behavior During Therapy  [facility] for tasks assessed/performed       Past Medical History:  Diagnosis Date  . Osteoporosis   . Rheumatoid arthritis [facility])     Past Surgical History:  Procedure Laterality Date  . INTRAMEDULLARY (IM) NAIL INTERTROCHANTERIC Right 03/14/2015   Procedure: RIGHT INTERTROCHANTRIC INTRAMEDULLARY (IM) NAIL ;  Surgeon: Mcarthur Rossetti, MD;  Location: [place];  Service: Orthopedics;  Laterality: Right;    There were no vitals filed for this visit.              Wound Therapy - 08/22/18 1032    Subjective  pt states her leg did not bother her at all since last visit.  STates she thinks that piece of foam aggrevated it.     Patient and Family Stated Goals  wound to heal     Date of Onset  05/05/18    Prior Treatments  self care, antibiotics     Pain Scale  0-10    Pain Score  0-No pain    Evaluation and Treatment Procedures Explained to Patient/Family  Yes    Evaluation and Treatment Procedures  agreed to    Wound Properties Date First Assessed: 08/03/18 Time First Assessed: 0949 Wound Type: Puncture Location: Leg Location Orientation: Right Present on Admission: Yes   Dressing Type  Compression wrap    Dressing Changed  Changed    Dressing Status  Old drainage    Dressing Change Frequency  PRN    Site / Wound Assessment  Granulation tissue    % Wound base Red or Granulating   95%   following debridement   % Wound base Black/Eschar  5%    Peri-wound Assessment  Edema;Erythema (blanchable)   redness extends 2 cm medially; 2.5 cm infer and laterally    Wound Length (cm)  3.4 cm    Wound Width (cm)  2.1 cm    Wound Depth (cm)  0 cm    Wound Volume (cm^3)  0 cm^3    Wound Surface Area (cm^2)  7.14 cm^2    Margins  Attached edges (approximated)    Drainage Amount  Minimal    Drainage Description  Serosanguineous    Treatment  Cleansed;Debridement (Selective)    Selective Debridement - Location  areas in wound base that has slough and devitalized tissue.     Selective Debridement - Tools Used  Forceps    Selective Debridement - Tissue Removed  slough and devitalized tissues     Wound Therapy - Clinical Statement  wound overall without change with slightly increased hypergranlualtion this session and more maceration perimeter of wound.  Cleaned area well, removed devitalized tissue perimeter of wound.  Changed dressing to silver hydrofiber to help reduce hypergranulation and maceration.  Also applied medipore at a  stretch to put pressure on wound bed to help promote reduction in hypergranulation.  Redness aroound wound much improved and still within borders outlined at last visit.  continued with moisturization of LE and use of profore lite.      Wound Therapy - Frequency  2X / week   x 4 weeks    Wound Therapy - Current Recommendations  PT    Wound Plan  PT to be seen for cleansing, debridement and proper dressing to maintain a healing environment.  check redness around wound to ensure does not go past drawn borders. Change dressing as conducive to wound environment.     Dressing   silver hydrofiber, 2x2, medipore and profore lite.                 PT Short Term Goals - 08/15/18 1031      PT SHORT TERM GOAL #1   Title  PT wound to be 100% granulated to decrease cellulitis    Time  2    Period  Weeks    Status  Partially Met    Target Date  08/17/18       PT SHORT TERM GOAL #2   Title  Pt pain to be no greater than a 4/10 to allow pt to be able to tolerate standing/walking for up  to 30 minutes without increased pain for improved functional ability.     Time  2    Period  Weeks    Status  Achieved        PT Long Term Goals - 08/15/18 1031      PT LONG TERM GOAL #1   Title  Wound size to be decreased to no greater than 1.5x1.5 cm to allow pt to feel comfortable to complete self care for wound.     Time  4    Period  Weeks    Status  On-going      PT LONG TERM GOAL #2   Title  Pt to be able to vocalize the benefits of wearing compression garments for varicosities.     Time  4    Period  Weeks    Status  On-going              Patient will benefit from skilled therapeutic intervention in order to improve the following deficits and impairments:     Visit Diagnosis: Pain in right lower leg  Open leg wound, right, sequela     Problem List Patient Active Problem List   Diagnosis Date Noted  . Rash and nonspecific skin eruption 04/06/2015  . Acute blood loss anemia 03/22/2015  . Edema 03/21/2015  . Acute renal failure syndrome ([place])   . Closed right hip fracture ([place]) 03/14/2015  . Rheumatoid arthritis ([place]) 03/14/2015  . Fall 03/14/2015   Teena Irani, PTA/CLT [phone]  Teena Irani 08/22/2018, 10:36 AM  [place] [address] [place], [name], [address] Phone: [phone]   Fax:  [phone]  Name: Michaela Simpson MRN: [id number] Date of Birth: [birth year]

## 2018-08-24 ENCOUNTER — Ambulatory Visit (HOSPITAL_COMMUNITY): Payer: Medicare Other | Admitting: Physical Therapy

## 2018-08-24 DIAGNOSIS — S81801S Unspecified open wound, right lower leg, sequela: Secondary | ICD-10-CM

## 2018-08-24 DIAGNOSIS — M79661 Pain in right lower leg: Secondary | ICD-10-CM

## 2018-08-24 NOTE — Therapy (Signed)
[place], [name], [address] Phone: [phone]   Fax:  [phone]  Wound Care Therapy  Patient Details  Name: Michaela Simpson MRN: [id number] Date of Birth: [birth year] Referring Provider (PT): Dr. Allyn Kenner   Encounter Date: 08/24/2018  PT End of Session - 08/24/18 1540    Visit Number  6    Number of Visits  8    Date for PT Re-Evaluation  09/02/18    PT Start Time  0945    PT Stop Time  1015    PT Time Calculation (min)  30 min    Activity Tolerance  Patient tolerated treatment well    Behavior During Therapy  [facility] for tasks assessed/performed       Past Medical History:  Diagnosis Date  . Osteoporosis   . Rheumatoid arthritis [facility])     Past Surgical History:  Procedure Laterality Date  . INTRAMEDULLARY (IM) NAIL INTERTROCHANTERIC Right 03/14/2015   Procedure: RIGHT INTERTROCHANTRIC INTRAMEDULLARY (IM) NAIL ;  Surgeon: Mcarthur Rossetti, MD;  Location: [place];  Service: Orthopedics;  Laterality: Right;    There were no vitals filed for this visit.              Wound Therapy - 08/24/18 1536    Subjective  pt states she can tell it's improving even without looking.    Patient and Family Stated Goals  wound to heal     Date of Onset  05/05/18    Prior Treatments  self care, antibiotics     Pain Scale  0-10    Pain Score  0-No pain    Evaluation and Treatment Procedures Explained to Patient/Family  Yes    Evaluation and Treatment Procedures  agreed to    Wound Properties Date First Assessed: 08/03/18 Time First Assessed: 0949 Wound Type: Puncture Location: Leg Location Orientation: Right Present on Admission: Yes   Dressing Type  Compression wrap    Dressing Changed  Changed    Dressing Status  Old drainage    Dressing Change Frequency  PRN    Site / Wound Assessment  Granulation tissue    % Wound base Red or Granulating  95%   following debridement   % Wound base Black/Eschar   5%    Peri-wound Assessment  Edema;Erythema (blanchable)   redness extends 2 cm medially; 2.5 cm infer and laterally    Wound Length (cm)  3 cm    Wound Width (cm)  1.9 cm    Wound Depth (cm)  0 cm    Wound Volume (cm^3)  0 cm^3    Wound Surface Area (cm^2)  5.7 cm^2    Margins  Attached edges (approximated)    Drainage Amount  Minimal    Drainage Description  Serosanguineous    Treatment  Cleansed;Debridement (Selective)    Selective Debridement - Location  areas in wound base that has slough and devitalized tissue.     Selective Debridement - Tools Used  Forceps    Selective Debridement - Tissue Removed  slough and devitalized tissues     Wound Therapy - Clinical Statement  Vast improvement noted when dressings removed.  silver hydrofiber working much better to reduce wetness of wound and reduce maceration and hypergranluation.  Measured with reduced size as well.  No longer with redness perimeter of wound.     Wound Therapy - Frequency  2X / week   x 4 weeks    Wound  Therapy - Current Recommendations  PT    Wound Plan  PT to be seen for cleansing, debridement and proper dressing to maintain a healing environment.      Dressing   silver hydrofiber, 2x2, medipore and profore lite.                 PT Short Term Goals - 08/15/18 1031      PT SHORT TERM GOAL #1   Title  PT wound to be 100% granulated to decrease cellulitis    Time  2    Period  Weeks    Status  Partially Met    Target Date  08/17/18      PT SHORT TERM GOAL #2   Title  Pt pain to be no greater than a 4/10 to allow pt to be able to tolerate standing/walking for up  to 30 minutes without increased pain for improved functional ability.     Time  2    Period  Weeks    Status  Achieved        PT Long Term Goals - 08/15/18 1031      PT LONG TERM GOAL #1   Title  Wound size to be decreased to no greater than 1.5x1.5 cm to allow pt to feel comfortable to complete self care for wound.     Time  4     Period  Weeks    Status  On-going      PT LONG TERM GOAL #2   Title  Pt to be able to vocalize the benefits of wearing compression garments for varicosities.     Time  4    Period  Weeks    Status  On-going              Patient will benefit from skilled therapeutic intervention in order to improve the following deficits and impairments:     Visit Diagnosis: Pain in right lower leg  Open leg wound, right, sequela     Problem List Patient Active Problem List   Diagnosis Date Noted  . Rash and nonspecific skin eruption 04/06/2015  . Acute blood loss anemia 03/22/2015  . Edema 03/21/2015  . Acute renal failure syndrome ([place])   . Closed right hip fracture ([place]) 03/14/2015  . Rheumatoid arthritis ([place]) 03/14/2015  . Fall 03/14/2015   Teena Irani, PTA/CLT [phone]  Teena Irani 08/24/2018, 3:41 PM  [facility] [address] [place], [name], [address] Phone: [phone]   Fax:  [phone]  Name: Michaela Simpson MRN: [id number] Date of Birth: 1932/01/31

## 2018-08-29 ENCOUNTER — Ambulatory Visit (HOSPITAL_COMMUNITY): Payer: Medicare Other | Admitting: Physical Therapy

## 2018-08-29 DIAGNOSIS — M79661 Pain in right lower leg: Secondary | ICD-10-CM | POA: Diagnosis not present

## 2018-08-29 DIAGNOSIS — S81801S Unspecified open wound, right lower leg, sequela: Secondary | ICD-10-CM

## 2018-08-29 NOTE — Therapy (Signed)
[place], [name], [address] Phone: [phone]   Fax:  [phone]  Wound Care Therapy  Patient Details  Name: Michaela Simpson MRN: [id number] Date of Birth: March 06, 1932 Referring Provider (PT): Dr. Allyn Kenner   Encounter Date: 08/29/2018    Past Medical History:  Diagnosis Date  . Osteoporosis   . Rheumatoid arthritis [facility])     Past Surgical History:  Procedure Laterality Date  . INTRAMEDULLARY (IM) NAIL INTERTROCHANTERIC Right 03/14/2015   Procedure: RIGHT INTERTROCHANTRIC INTRAMEDULLARY (IM) NAIL ;  Surgeon: Mcarthur Rossetti, MD;  Location: [facility] [place];  Service: Orthopedics;  Laterality: Right;    There were no vitals filed for this visit.              Wound Therapy - 08/29/18 1023    Subjective  pt states it has not hurt, feeling much better.    Patient and Family Stated Goals  wound to heal     Date of Onset  05/05/18    Prior Treatments  self care, antibiotics     Pain Scale  0-10    Pain Score  0-No pain    Evaluation and Treatment Procedures Explained to Patient/Family  Yes    Evaluation and Treatment Procedures  agreed to    Wound Properties Date First Assessed: 08/03/18 Time First Assessed: 0949 Wound Type: Puncture Location: Leg Location Orientation: Right Present on Admission: Yes   Dressing Type  Compression wrap    Dressing Changed  Changed    Dressing Status  Old drainage    Dressing Change Frequency  PRN    Site / Wound Assessment  Granulation tissue    % Wound base Red or Granulating  95%   following debridement   % Wound base Black/Eschar  5%    Peri-wound Assessment  Edema;Erythema (blanchable)   redness extends 2 cm medially; 2.5 cm infer and laterally    Wound Length (cm)  2.2 cm    Wound Width (cm)  1.9 cm    Wound Depth (cm)  0 cm    Wound Volume (cm^3)  0 cm^3    Wound Surface Area (cm^2)  4.18 cm^2    Margins  Attached edges (approximated)    Drainage  Amount  Scant    Drainage Description  Serosanguineous    Treatment  Cleansed;Debridement (Selective)    Selective Debridement - Location  areas in wound base that has slough and devitalized tissue.     Selective Debridement - Tools Used  Forceps    Selective Debridement - Tissue Removed  slough and devitalized tissues     Wound Therapy - Clinical Statement  Wound with less drainage, dressing was stuck to woundbed requiring moisture to relelase.  significant reduction in hypergranulation and increased approximation of wound borders.  Measured with reduced size this session, only minimal redness immediate wound borders.  Changed dressing back to xeroform to regulate moisture of woundbed.  continued with xerofom.      Wound Therapy - Frequency  2X / week   x 4 weeks    Wound Therapy - Current Recommendations  PT    Wound Plan  PT to be seen for cleansing, debridement and proper dressing to maintain a healing environment.      Dressing   xeroform, 2x2, medipore and profore lite.                 PT Short Term Goals - 08/15/18 1031  PT SHORT TERM GOAL #1   Title  PT wound to be 100% granulated to decrease cellulitis    Time  2    Period  Weeks    Status  Partially Met    Target Date  08/17/18      PT SHORT TERM GOAL #2   Title  Pt pain to be no greater than a 4/10 to allow pt to be able to tolerate standing/walking for up  to 30 minutes without increased pain for improved functional ability.     Time  2    Period  Weeks    Status  Achieved        PT Long Term Goals - 08/15/18 1031      PT LONG TERM GOAL #1   Title  Wound size to be decreased to no greater than 1.5x1.5 cm to allow pt to feel comfortable to complete self care for wound.     Time  4    Period  Weeks    Status  On-going      PT LONG TERM GOAL #2   Title  Pt to be able to vocalize the benefits of wearing compression garments for varicosities.     Time  4    Period  Weeks    Status  On-going               Patient will benefit from skilled therapeutic intervention in order to improve the following deficits and impairments:     Visit Diagnosis: Pain in right lower leg  Open leg wound, right, sequela     Problem List Patient Active Problem List   Diagnosis Date Noted  . Rash and nonspecific skin eruption 04/06/2015  . Acute blood loss anemia 03/22/2015  . Edema 03/21/2015  . Acute renal failure syndrome ([place])   . Closed right hip fracture ([place]) 03/14/2015  . Rheumatoid arthritis ([place]) 03/14/2015  . Fall 03/14/2015   Teena Irani, PTA/CLT [phone]  Teena Irani 08/29/2018, 10:27 AM  [place], [name], [address] Phone: [phone]   Fax:  [phone]  Name: Michaela Simpson MRN: [id number] Date of Birth: [birth year]

## 2018-08-31 ENCOUNTER — Ambulatory Visit (HOSPITAL_COMMUNITY): Payer: Medicare Other

## 2018-08-31 ENCOUNTER — Other Ambulatory Visit: Payer: Self-pay

## 2018-08-31 ENCOUNTER — Encounter (HOSPITAL_COMMUNITY): Payer: Self-pay

## 2018-08-31 DIAGNOSIS — S81801S Unspecified open wound, right lower leg, sequela: Secondary | ICD-10-CM

## 2018-08-31 DIAGNOSIS — M79661 Pain in right lower leg: Secondary | ICD-10-CM | POA: Diagnosis not present

## 2018-08-31 NOTE — Therapy (Signed)
[place], [name], [address] Phone: [phone]   Fax:  [phone]  Physical Therapy Treatment  Patient Details  Name: Michaela Simpson MRN: [id number] Date of Birth: Nov 16, 1931 Referring Provider (PT): Dr. Allyn Kenner   Encounter Date: 08/31/2018  PT End of Session - 08/31/18 1255    Visit Number  7    Number of Visits  8    PT Start Time  0948    PT Stop Time  1015    PT Time Calculation (min)  27 min    Activity Tolerance  Patient tolerated treatment well    Behavior During Therapy  [facility] for tasks assessed/performed       Past Medical History:  Diagnosis Date  . Osteoporosis   . Rheumatoid arthritis [facility])     Past Surgical History:  Procedure Laterality Date  . INTRAMEDULLARY (IM) NAIL INTERTROCHANTERIC Right 03/14/2015   Procedure: RIGHT INTERTROCHANTRIC INTRAMEDULLARY (IM) NAIL ;  Surgeon: Mcarthur Rossetti, MD;  Location: [place];  Service: Orthopedics;  Laterality: Right;    There were no vitals filed for this visit.  Subjective Assessment - 08/31/18 1252    Subjective  Pt arrived with dressings intact, no reoprts of pain currently    Currently in Pain?  No/denies                     Wound Therapy - 08/31/18 1252    Subjective  Pt arrived with dressings intact, no reoprts of pain currently    Patient and Family Stated Goals  wound to heal     Date of Onset  05/05/18    Prior Treatments  self care, antibiotics     Pain Scale  0-10    Pain Score  0-No pain    Evaluation and Treatment Procedures Explained to Patient/Family  Yes    Evaluation and Treatment Procedures  agreed to    Wound Properties Date First Assessed: 08/03/18 Time First Assessed: 0949 Wound Type: Puncture Location: Leg Location Orientation: Right Present on Admission: Yes   Dressing Type  Impregnated gauze (bismuth);Compression wrap   xeroform, profore lite   Dressing Changed  Changed    Dressing Status   Old drainage    Dressing Change Frequency  PRN    Site / Wound Assessment  Granulation tissue    % Wound base Red or Granulating  100%    % Wound base Black/Eschar  0%    Peri-wound Assessment  Edema;Erythema (blanchable)    Wound Length (cm)  2.2 cm    Wound Width (cm)  1.9 cm    Wound Depth (cm)  0 cm    Wound Volume (cm^3)  0 cm^3    Wound Surface Area (cm^2)  4.18 cm^2    Margins  Attached edges (approximated)    Drainage Amount  Scant    Drainage Description  Serosanguineous    Treatment  Cleansed;Debridement (Selective)    Selective Debridement - Location  areas in wound base that has slough and devitalized tissue.     Selective Debridement - Tools Used  Forceps    Selective Debridement - Tissue Removed  slough and devitalized tissues     Wound Therapy - Clinical Statement  Wound presents wiht less drainage and improve granulation tissue.  Continued selective debriement for removal of slough in wound bed and dryskin perimeter.  Continued wiht xeroform dressings and profore lite for edema control.  No reports of pain through  session.      Wound Therapy - Frequency  2X / week   x 4 weeks   Wound Therapy - Current Recommendations  PT    Wound Plan  PT to be seen for cleansing, debridement and proper dressing to maintain a healing environment.      Dressing   xeroform, 2x2, medipore and profore lite.                  PT Short Term Goals - 08/15/18 1031      PT SHORT TERM GOAL #1   Title  PT wound to be 100% granulated to decrease cellulitis    Time  2    Period  Weeks    Status  Partially Met    Target Date  08/17/18      PT SHORT TERM GOAL #2   Title  Pt pain to be no greater than a 4/10 to allow pt to be able to tolerate standing/walking for up  to 30 minutes without increased pain for improved functional ability.     Time  2    Period  Weeks    Status  Achieved        PT Long Term Goals - 08/15/18 1031      PT LONG TERM GOAL #1   Title  Wound size to be  decreased to no greater than 1.5x1.5 cm to allow pt to feel comfortable to complete self care for wound.     Time  4    Period  Weeks    Status  On-going      PT LONG TERM GOAL #2   Title  Pt to be able to vocalize the benefits of wearing compression garments for varicosities.     Time  4    Period  Weeks    Status  On-going              Patient will benefit from skilled therapeutic intervention in order to improve the following deficits and impairments:     Visit Diagnosis: Pain in right lower leg  Open leg wound, right, sequela     Problem List Patient Active Problem List   Diagnosis Date Noted  . Rash and nonspecific skin eruption 04/06/2015  . Acute blood loss anemia 03/22/2015  . Edema 03/21/2015  . Acute renal failure syndrome ([place])   . Closed right hip fracture ([place]) 03/14/2015  . Rheumatoid arthritis ([place]) 03/14/2015  . Fall 03/14/2015   Ihor Austin, [place]; [place]  Aldona Lento 08/31/2018, 12:56 PM  [place] [address] [place], [name], [address] Phone: [phone]   Fax:  [phone]  Name: Michaela Simpson MRN: [id number] Date of Birth: 1932/05/22

## 2018-09-04 ENCOUNTER — Other Ambulatory Visit: Payer: Self-pay

## 2018-09-04 ENCOUNTER — Ambulatory Visit (HOSPITAL_COMMUNITY): Payer: Medicare Other | Admitting: Physical Therapy

## 2018-09-04 ENCOUNTER — Ambulatory Visit (HOSPITAL_COMMUNITY): Payer: Medicare Other

## 2018-09-04 ENCOUNTER — Encounter (HOSPITAL_COMMUNITY): Payer: Self-pay

## 2018-09-04 DIAGNOSIS — S81801S Unspecified open wound, right lower leg, sequela: Secondary | ICD-10-CM | POA: Diagnosis not present

## 2018-09-04 DIAGNOSIS — M79661 Pain in right lower leg: Secondary | ICD-10-CM

## 2018-09-04 NOTE — Therapy (Signed)
[place], [name], [address] Phone: [phone]   Fax:  [phone]  Wound Care Therapy/Progress Note  Patient Details  Name: Michaela Simpson MRN: [id number] Date of Birth: 1932-02-15 Referring Provider (PT): Dr. Allyn Kenner   Encounter Date: 09/04/2018  Progress Note Reporting Period 08/03/18 to 09/04/18  See note below for Objective Data and Assessment of Progress/Goals.    PT End of Session - 09/04/18 1210    Visit Number  8    Number of Visits  16    Date for PT Re-Evaluation  10/02/18    Authorization Type  Medicare Part A & B    Authorization Time Period  08/03/18- 09/02/18; 09/04/2018 - 10/02/2018    Authorization - Visit Number  1    Authorization - Number of Visits  10    PT Start Time  1120    PT Stop Time  1152    PT Time Calculation (min)  32 min    Activity Tolerance  Patient tolerated treatment well    Behavior During Therapy  WFL for tasks assessed/performed       Past Medical History:  Diagnosis Date  . Osteoporosis   . Rheumatoid arthritis [facility])     Past Surgical History:  Procedure Laterality Date  . INTRAMEDULLARY (IM) NAIL INTERTROCHANTERIC Right 03/14/2015   Procedure: RIGHT INTERTROCHANTRIC INTRAMEDULLARY (IM) NAIL ;  Surgeon: Mcarthur Rossetti, MD;  Location: [place];  Service: Orthopedics;  Laterality: Right;    There were no vitals filed for this visit.    Wound Therapy - 09/04/18 1202    Subjective  Patient arrived with dressing intact and she denies pain at this time.     Patient and Family Stated Goals  wound to heal     Date of Onset  05/05/18    Prior Treatments  self care, antibiotics     Pain Scale  0-10    Pain Score  0-No pain    Evaluation and Treatment Procedures Explained to Patient/Family  Yes    Evaluation and Treatment Procedures  agreed to    Wound Properties Date First Assessed: 08/03/18 Time First Assessed: 0949 Wound Type: Puncture Location: Leg  Location Orientation: Right Present on Admission: Yes   Dressing Type  Impregnated gauze (bismuth);Compression wrap   xeroform, profore lite   Dressing Changed  Changed    Dressing Status  Old drainage    Dressing Change Frequency  PRN    Site / Wound Assessment  Granulation tissue    % Wound base Red or Granulating  100%    % Wound base Black/Eschar  0%    Peri-wound Assessment  Edema;Erythema (blanchable)    Wound Length (cm)  2.2 cm    Wound Width (cm)  2.3 cm    Wound Depth (cm)  0 cm    Wound Volume (cm^3)  0 cm^3    Wound Surface Area (cm^2)  5.06 cm^2    Margins  Attached edges (approximated)    Drainage Amount  Scant    Drainage Description  Serosanguineous    Treatment  Cleansed;Debridement (Selective)    Selective Debridement - Location  areas in wound base that has slough and devitalized tissue.     Selective Debridement - Tools Used  Forceps    Selective Debridement - Tissue Removed  slough and devitalized tissues     Wound Therapy - Clinical Statement  Patient's wound continues to have reduce drainage and periwound has  reduced signs of maceration. Wound bed presents with increased hypergranulation and Abd pas was applied overtop of dressing with stretch on medipore tape to increase pressure to wound bed. Continued with xeroform in wound bed and profore lite. Patient will continue to benefit from skilled wound care to promote healing environment and reduce infection risk.    Wound Therapy - Frequency  2X / week   4 additional weeks   Wound Therapy - Current Recommendations  PT    Wound Plan  Measure 1x/week. Cleanse debride. If wound continues to present with hypergranulation change dressing to alginate.     Dressing   xeroform, 2x2, medipore and profore lite.          PT Short Term Goals - 09/04/18 1212      PT SHORT TERM GOAL #1   Title  PT wound to be 100% granulated to decrease cellulitis    Time  2    Period  Weeks    Status  Partially Met    Target Date   08/17/18      PT SHORT TERM GOAL #2   Title  Pt pain to be no greater than a 4/10 to allow pt to be able to tolerate standing/walking for up  to 30 minutes without increased pain for improved functional ability.     Time  2    Period  Weeks    Status  Achieved        PT Long Term Goals - 09/04/18 1212      PT LONG TERM GOAL #1   Title  Wound size to be decreased to no greater than 1.5x1.5 cm to allow pt to feel comfortable to complete self care for wound.     Time  4    Period  Weeks    Status  On-going      PT LONG TERM GOAL #2   Title  Pt to be able to vocalize the benefits of wearing compression garments for varicosities.     Time  4    Period  Weeks    Status  On-going        Plan - 09/04/18 1212    Clinical Impression Statement  see above    Rehab Potential  Good    PT Frequency  2x / week    PT Duration  4 weeks    PT Treatment/Interventions  ADLs/Self Care Home Management;Patient/family education;Other (comment)   debridement and dressing change    PT Next Visit Plan  see above    Consulted and Agree with Plan of Care  Patient       Patient will benefit from skilled therapeutic intervention in order to improve the following deficits and impairments:  Decreased skin integrity, Pain  Visit Diagnosis: Pain in right lower leg  Open leg wound, right, sequela     Problem List Patient Active Problem List   Diagnosis Date Noted  . Rash and nonspecific skin eruption 04/06/2015  . Acute blood loss anemia 03/22/2015  . Edema 03/21/2015  . Acute renal failure syndrome ([place])   . Closed right hip fracture ([place]) 03/14/2015  . Rheumatoid arthritis ([place]) 03/14/2015  . Fall 03/14/2015    Kipp Brood, PT, DPT, [facility] Physical Therapist with [facility]  09/04/2018 12:13 PM    [place] [address] [place], [name], [address] Phone: [phone]   Fax:  [phone]  Name: Michaela Simpson MRN: [id number] Date of  Birth: 1931-09-08

## 2018-09-06 ENCOUNTER — Ambulatory Visit (HOSPITAL_COMMUNITY): Payer: Medicare Other | Admitting: Physical Therapy

## 2018-09-07 ENCOUNTER — Other Ambulatory Visit: Payer: Self-pay

## 2018-09-07 ENCOUNTER — Encounter (HOSPITAL_COMMUNITY): Payer: Self-pay

## 2018-09-07 ENCOUNTER — Ambulatory Visit (HOSPITAL_COMMUNITY): Payer: Medicare Other

## 2018-09-07 DIAGNOSIS — M79661 Pain in right lower leg: Secondary | ICD-10-CM | POA: Diagnosis not present

## 2018-09-07 DIAGNOSIS — S81801S Unspecified open wound, right lower leg, sequela: Secondary | ICD-10-CM

## 2018-09-07 NOTE — Therapy (Signed)
[place], [name], [address] Phone: [phone]   Fax:  [phone]  Wound Care Therapy  Patient Details  Name: Michaela Simpson MRN: [id number] Date of Birth: 10-08-1931 Referring Provider (PT): Dr. Allyn Kenner   Encounter Date: 09/07/2018  PT End of Session - 09/07/18 1643    Visit Number  9    Number of Visits  16    Date for PT Re-Evaluation  10/02/18    Authorization Type  Medicare Part A & B    Authorization Time Period  08/03/18- 09/02/18; 09/04/2018 - 10/02/2018    Authorization - Visit Number  2    Authorization - Number of Visits  10    PT Start Time  1433    PT Stop Time  1500    PT Time Calculation (min)  27 min    Activity Tolerance  Patient tolerated treatment well    Behavior During Therapy  [facility] for tasks assessed/performed       Past Medical History:  Diagnosis Date  . Osteoporosis   . Rheumatoid arthritis [facility])     Past Surgical History:  Procedure Laterality Date  . INTRAMEDULLARY (IM) NAIL INTERTROCHANTERIC Right 03/14/2015   Procedure: RIGHT INTERTROCHANTRIC INTRAMEDULLARY (IM) NAIL ;  Surgeon: Mcarthur Rossetti, MD;  Location: [facility];  Service: Orthopedics;  Laterality: Right;    There were no vitals filed for this visit.    Wound Therapy - 09/07/18 1638    Subjective  Patient arrives with dressing intact. She denies pain.     Patient and Family Stated Goals  wound to heal     Date of Onset  05/05/18    Prior Treatments  self care, antibiotics     Pain Scale  0-10    Pain Score  0-No pain    Evaluation and Treatment Procedures Explained to Patient/Family  Yes    Evaluation and Treatment Procedures  agreed to    Wound Properties Date First Assessed: 08/03/18 Time First Assessed: 0949 Wound Type: Puncture Location: Leg Location Orientation: Right Present on Admission: Yes   Dressing Type  Impregnated gauze (bismuth);Compression wrap   xeroform, silver hydrofiber, profore  lite   Dressing Changed  Changed    Dressing Status  Old drainage    Dressing Change Frequency  PRN    Site / Wound Assessment  Granulation tissue    % Wound base Red or Granulating  100%    % Wound base Black/Eschar  0%    Peri-wound Assessment  Edema;Erythema (blanchable)    Margins  Attached edges (approximated)    Drainage Amount  Minimal    Drainage Description  Serosanguineous    Treatment  Cleansed;Debridement (Selective)    Selective Debridement - Location  areas in wound base that has slough and devitalized tissue.     Selective Debridement - Tools Used  Forceps    Selective Debridement - Tissue Removed  slough and devitalized tissues     Wound Therapy - Clinical Statement  Patient's wound presents with continued hypergranulation in majority of wound bed. Small area at right superior board is not hypergranulated and is approximating well. Continued to cleanse and debride wound with slough and biofilm easily removed. Small area at superior right boarder dressed with xeroform while majority of wound bed transitioned to silver hydrofiber dressing to reduce moisture. Medipore tape stretch over gauze and dressings to apply extra compression and profore lite continued. Patient will continue to benefit from skilled  wound care to promote healing environment and reduce infection risk.    Wound Therapy - Frequency  2X / week   4 additional weeks   Wound Therapy - Current Recommendations  PT    Wound Plan  Measure 1x/week. Cleanse debride. If wound continues to present with hypergranulation change dressing to alginate.     Dressing   xeroform, silver hydrofiber over majority of wound bed, 2x2, medipore and profore lite.          PT Short Term Goals - 09/04/18 1212      PT SHORT TERM GOAL #1   Title  PT wound to be 100% granulated to decrease cellulitis    Time  2    Period  Weeks    Status  Partially Met    Target Date  08/17/18      PT SHORT TERM GOAL #2   Title  Pt pain to be no  greater than a 4/10 to allow pt to be able to tolerate standing/walking for up  to 30 minutes without increased pain for improved functional ability.     Time  2    Period  Weeks    Status  Achieved        PT Long Term Goals - 09/04/18 1212      PT LONG TERM GOAL #1   Title  Wound size to be decreased to no greater than 1.5x1.5 cm to allow pt to feel comfortable to complete self care for wound.     Time  4    Period  Weeks    Status  On-going      PT LONG TERM GOAL #2   Title  Pt to be able to vocalize the benefits of wearing compression garments for varicosities.     Time  4    Period  Weeks    Status  On-going        Plan - 09/07/18 1643    Clinical Impression Statement  see above    Rehab Potential  Good    PT Frequency  2x / week    PT Duration  4 weeks    PT Treatment/Interventions  ADLs/Self Care Home Management;Patient/family education;Other (comment)   debridement and dressing change    PT Next Visit Plan  see above    Consulted and Agree with Plan of Care  Patient       Patient will benefit from skilled therapeutic intervention in order to improve the following deficits and impairments:  Decreased skin integrity, Pain  Visit Diagnosis: Pain in right lower leg  Open leg wound, right, sequela     Problem List Patient Active Problem List   Diagnosis Date Noted  . Rash and nonspecific skin eruption 04/06/2015  . Acute blood loss anemia 03/22/2015  . Edema 03/21/2015  . Acute renal failure syndrome ([place])   . Closed right hip fracture ([place]) 03/14/2015  . Rheumatoid arthritis ([place]) 03/14/2015  . Fall 03/14/2015    Kipp Brood, PT, DPT, [facility] Physical Therapist with [facility]  09/07/2018 4:44 PM    [place] [address] [place], [name], [address] Phone: [phone]   Fax:  [phone]  Name: LAYANNA Simpson MRN: [id number] Date of Birth: 1931-11-17

## 2018-09-08 ENCOUNTER — Telehealth (HOSPITAL_COMMUNITY): Payer: Self-pay | Admitting: Internal Medicine

## 2018-09-08 NOTE — Telephone Encounter (Signed)
09/08/18  left patient a message to let her know about these schedule changes.... MM

## 2018-09-11 ENCOUNTER — Other Ambulatory Visit: Payer: Self-pay

## 2018-09-11 ENCOUNTER — Encounter (HOSPITAL_COMMUNITY): Payer: Self-pay

## 2018-09-11 ENCOUNTER — Ambulatory Visit (HOSPITAL_COMMUNITY): Payer: Medicare Other

## 2018-09-11 DIAGNOSIS — S81801S Unspecified open wound, right lower leg, sequela: Secondary | ICD-10-CM

## 2018-09-11 DIAGNOSIS — M79661 Pain in right lower leg: Secondary | ICD-10-CM | POA: Diagnosis not present

## 2018-09-11 NOTE — Therapy (Signed)
[place], [name], [address] Phone: [phone]   Fax:  [phone]  Wound Care Therapy  Patient Details  Name: Michaela Simpson MRN: [id number] Date of Birth: 1931-10-16 Referring Provider (PT): Dr. Allyn Kenner   Encounter Date: 09/11/2018  PT End of Session - 09/11/18 1225    Visit Number  10    Number of Visits  16    Date for PT Re-Evaluation  10/02/18    Authorization Type  Medicare Part A & B    Authorization Time Period  08/03/18- 09/02/18; 09/04/2018 - 10/02/2018    Authorization - Visit Number  3    Authorization - Number of Visits  10    PT Start Time  0815    PT Stop Time  0852    PT Time Calculation (min)  37 min    Activity Tolerance  Patient tolerated treatment well    Behavior During Therapy  [facility] for tasks assessed/performed       Past Medical History:  Diagnosis Date  . Osteoporosis   . Rheumatoid arthritis [facility])     Past Surgical History:  Procedure Laterality Date  . INTRAMEDULLARY (IM) NAIL INTERTROCHANTERIC Right 03/14/2015   Procedure: RIGHT INTERTROCHANTRIC INTRAMEDULLARY (IM) NAIL ;  Surgeon: Mcarthur Rossetti, MD;  Location: [place];  Service: Orthopedics;  Laterality: Right;    There were no vitals filed for this visit.    Wound Therapy - 09/11/18 1215    Subjective  Patient arrives with dressing intact and enies pain at start of session.    Patient and Family Stated Goals  wound to heal     Date of Onset  05/05/18    Prior Treatments  self care, antibiotics     Pain Scale  0-10    Pain Score  0-No pain   3/10 at end of session   Evaluation and Treatment Procedures Explained to Patient/Family  Yes    Evaluation and Treatment Procedures  agreed to    Wound Properties Date First Assessed: 08/03/18 Time First Assessed: 0949 Wound Type: Puncture Location: Leg Location Orientation: Right Present on Admission: Yes   Dressing Type  Impregnated gauze (bismuth);Compression wrap    xeroform, silver hydrofiber, profore lite   Dressing Changed  Changed    Dressing Status  Old drainage    Dressing Change Frequency  PRN    Site / Wound Assessment  Granulation tissue    % Wound base Red or Granulating  100%   superior 50% is pink/distal 50% is dark red   % Wound base Black/Eschar  0%    Peri-wound Assessment  Edema;Erythema (blanchable)    Wound Length (cm)  2.7 cm    Wound Width (cm)  1.1 cm    Wound Depth (cm)  0.2 cm    Wound Volume (cm^3)  0.59 cm^3    Wound Surface Area (cm^2)  2.97 cm^2    Margins  Attached edges (approximated)    Drainage Amount  Minimal    Drainage Description  Serosanguineous    Treatment  Cleansed;Debridement (Selective)    Selective Debridement - Location  areas in wound base that has slough and devitalized tissue.     Selective Debridement - Tools Used  Forceps;Scissors    Selective Debridement - Tissue Removed  slough and devitalized tissues     Wound Therapy - Clinical Statement  Patient's wound presents with reduced hypergranulation this session. Small portion of wound at superior-lateral boarder has  fully approximated with new skin. Superior 50% of wound bed presents with pink granular tissue and center of wound bed remains slightly elevated to skin. Distal 50% of wound bed is dark red with depth of 0.2 cm to it. Wound bed remains tender to patient and pain increased to 3/10 at EOS. Suggested patient begins to pre-medicate prior to treatments to manage pain throughout. Xeroform applied to small portion of superior wound bed where no hypergranulation was present and silver hydrofiber applied to entire wound. 2x2 and ABD placed over it along with medipore tape stretched over dressings to apply extra compression. Continued with profore lite. Patient will continue to benefit from skilled wound care to promote healing environment and reduce infection risk.    Wound Therapy - Frequency  2X / week   4 additional weeks   Wound Therapy - Current  Recommendations  PT    Wound Plan  Measure on Mondays. Cleanse and debride. Continue with appropriate topical agent to manage hypergranulation and moist environment.     Dressing   xeroform, silver hydrofiber over majority of wound bed, 2x2, medipore and profore lite.          PT Short Term Goals - 09/04/18 1212      PT SHORT TERM GOAL #1   Title  PT wound to be 100% granulated to decrease cellulitis    Time  2    Period  Weeks    Status  Partially Met    Target Date  08/17/18      PT SHORT TERM GOAL #2   Title  Pt pain to be no greater than a 4/10 to allow pt to be able to tolerate standing/walking for up  to 30 minutes without increased pain for improved functional ability.     Time  2    Period  Weeks    Status  Achieved        PT Long Term Goals - 09/04/18 1212      PT LONG TERM GOAL #1   Title  Wound size to be decreased to no greater than 1.5x1.5 cm to allow pt to feel comfortable to complete self care for wound.     Time  4    Period  Weeks    Status  On-going      PT LONG TERM GOAL #2   Title  Pt to be able to vocalize the benefits of wearing compression garments for varicosities.     Time  4    Period  Weeks    Status  On-going        Plan - 09/11/18 1224    Clinical Impression Statement  see above    Rehab Potential  Good    PT Frequency  2x / week    PT Duration  4 weeks    PT Treatment/Interventions  ADLs/Self Care Home Management;Patient/family education;Other (comment)   debridement and dressing change    PT Next Visit Plan  see above    Consulted and Agree with Plan of Care  Patient       Patient will benefit from skilled therapeutic intervention in order to improve the following deficits and impairments:  Decreased skin integrity, Pain  Visit Diagnosis: Pain in right lower leg  Open leg wound, right, sequela     Problem List Patient Active Problem List   Diagnosis Date Noted  . Rash and nonspecific skin eruption 04/06/2015  . Acute  blood loss anemia 03/22/2015  . Edema 03/21/2015  .  Acute renal failure syndrome ([place])   . Closed right hip fracture ([place]) 03/14/2015  . Rheumatoid arthritis ([place]) 03/14/2015  . Fall 03/14/2015     Kipp Brood, PT, DPT, [facility] Physical Therapist with [facility]  09/11/2018 12:26 PM    [place], [name], [address] Phone: [phone]   Fax:  [phone]  Name: Michaela Simpson MRN: [id number] Date of Birth: Nov 15, 1931

## 2018-09-12 ENCOUNTER — Ambulatory Visit (HOSPITAL_COMMUNITY): Payer: Medicare Other | Admitting: Physical Therapy

## 2018-09-13 ENCOUNTER — Other Ambulatory Visit: Payer: Self-pay

## 2018-09-13 ENCOUNTER — Ambulatory Visit (HOSPITAL_COMMUNITY): Payer: Medicare Other

## 2018-09-13 ENCOUNTER — Encounter (HOSPITAL_COMMUNITY): Payer: Self-pay

## 2018-09-13 DIAGNOSIS — M79661 Pain in right lower leg: Secondary | ICD-10-CM | POA: Diagnosis not present

## 2018-09-13 DIAGNOSIS — S81801S Unspecified open wound, right lower leg, sequela: Secondary | ICD-10-CM | POA: Diagnosis not present

## 2018-09-13 NOTE — Therapy (Signed)
[place], [name], [address] Phone: [phone]   Fax:  [phone]  Wound Care Therapy  Patient Details  Name: Michaela Simpson MRN: [id number] Date of Birth: [birth year] Referring Provider (PT): Dr. Allyn Kenner   Encounter Date: 09/13/2018  PT End of Session - 09/13/18 1135    Visit Number  11    Number of Visits  16    Date for PT Re-Evaluation  10/02/18    Authorization Type  Medicare Part A & B    Authorization Time Period  08/03/18- 09/02/18; 09/04/2018 - 10/02/2018    Authorization - Visit Number  4    Authorization - Number of Visits  10    PT Start Time  0816    PT Stop Time  0850    PT Time Calculation (min)  34 min    Activity Tolerance  Patient tolerated treatment well    Behavior During Therapy  [facility] for tasks assessed/performed       Past Medical History:  Diagnosis Date  . Osteoporosis   . Rheumatoid arthritis [facility])     Past Surgical History:  Procedure Laterality Date  . INTRAMEDULLARY (IM) NAIL INTERTROCHANTERIC Right 03/14/2015   Procedure: RIGHT INTERTROCHANTRIC INTRAMEDULLARY (IM) NAIL ;  Surgeon: Mcarthur Rossetti, MD;  Location: [place];  Service: Orthopedics;  Laterality: Right;    There were no vitals filed for this visit.    Wound Therapy - 09/13/18 1118    Subjective  Patient arrives with dressing intact. She reports after last session she had increased pain all day that kept her up that night but states it went away the following day. She denies pain at start of session.     Patient and Family Stated Goals  wound to heal     Date of Onset  05/05/18    Prior Treatments  self care, antibiotics     Pain Scale  0-10    Pain Score  0-No pain   patient had pain throuhgout session 3/10 with debridement   Pain Intervention(s)  Distraction;Emotional support;Therapeutic touch    Evaluation and Treatment Procedures Explained to Patient/Family  Yes    Evaluation and Treatment  Procedures  agreed to    Wound Properties Date First Assessed: 08/03/18 Time First Assessed: 0949 Wound Type: Puncture Location: Leg Location Orientation: Right Present on Admission: Yes   Dressing Type  Impregnated gauze (bismuth);Compression wrap   xeroform, silver hydrofiber, profore lite   Dressing Changed  Changed    Dressing Status  Old drainage    Dressing Change Frequency  PRN    Site / Wound Assessment  Granulation tissue    % Wound base Red or Granulating  100%   superior 50% is pink/distal 50% is dark red   % Wound base Black/Eschar  0%    Peri-wound Assessment  Edema;Erythema (blanchable)    Margins  Attached edges (approximated)    Drainage Amount  Minimal    Drainage Description  Serosanguineous    Treatment  Cleansed;Debridement (Selective)    Selective Debridement - Location  areas in wound base that has slough and devitalized tissue.     Selective Debridement - Tools Used  Forceps    Selective Debridement - Tissue Removed  slough and devitalized tissues     Wound Therapy - Clinical Statement  Patient's wound presents with reduced hypergranulation and continued approximation along superior board of wound. Wound bed presents with more uniform red color to entire  base. Wound bed remains tender to patient and pain increased to 3/10 at EOS; she did not pre-medicate prior to treatment. Xeroform applied to entire wound bed with silver hydrofiber overlaying to address moisture and reduce hypergranualtion. 2x2 and medipore tape stretched over dressings to apply extra compression. Continued with profore lite. Patient will continue to benefit from skilled wound care to promote healing environment and reduce infection risk.    Wound Therapy - Frequency  2X / week   4 additional weeks   Wound Therapy - Current Recommendations  PT    Wound Plan  Measure on Mondays. Cleanse and debride. Continue with appropriate topical agent to manage hypergranulation and moist environment.     Dressing    xeroform, silver hydrofiber over majority of wound bed, 2x2, medipore and profore lite.          PT Short Term Goals - 09/04/18 1212      PT SHORT TERM GOAL #1   Title  PT wound to be 100% granulated to decrease cellulitis    Time  2    Period  Weeks    Status  Partially Met    Target Date  08/17/18      PT SHORT TERM GOAL #2   Title  Pt pain to be no greater than a 4/10 to allow pt to be able to tolerate standing/walking for up  to 30 minutes without increased pain for improved functional ability.     Time  2    Period  Weeks    Status  Achieved        PT Long Term Goals - 09/04/18 1212      PT LONG TERM GOAL #1   Title  Wound size to be decreased to no greater than 1.5x1.5 cm to allow pt to feel comfortable to complete self care for wound.     Time  4    Period  Weeks    Status  On-going      PT LONG TERM GOAL #2   Title  Pt to be able to vocalize the benefits of wearing compression garments for varicosities.     Time  4    Period  Weeks    Status  On-going        Plan - 09/13/18 1135    Clinical Impression Statement  see above    Rehab Potential  Good    PT Frequency  2x / week    PT Duration  4 weeks    PT Treatment/Interventions  ADLs/Self Care Home Management;Patient/family education;Other (comment)   debridement and dressing change    PT Next Visit Plan  see above    Consulted and Agree with Plan of Care  Patient       Patient will benefit from skilled therapeutic intervention in order to improve the following deficits and impairments:  Decreased skin integrity, Pain  Visit Diagnosis: Pain in right lower leg  Open leg wound, right, sequela     Problem List Patient Active Problem List   Diagnosis Date Noted  . Rash and nonspecific skin eruption 04/06/2015  . Acute blood loss anemia 03/22/2015  . Edema 03/21/2015  . Acute renal failure syndrome ([place])   . Closed right hip fracture ([place]) 03/14/2015  . Rheumatoid arthritis ([place]) 03/14/2015  .  Fall 03/14/2015    Kipp Brood, PT, DPT, [facility] Physical Therapist with [facility]  09/13/2018 11:35 AM    [place]  [address] [place], [name], [address] Phone: [phone]   Fax:  [phone]  Name: NANDA BITTICK MRN: [id number] Date of Birth: 05/24/1932

## 2018-09-14 ENCOUNTER — Ambulatory Visit (HOSPITAL_COMMUNITY): Payer: Medicare Other | Admitting: Physical Therapy

## 2018-09-18 ENCOUNTER — Other Ambulatory Visit: Payer: Self-pay

## 2018-09-18 ENCOUNTER — Ambulatory Visit (HOSPITAL_COMMUNITY): Payer: Medicare Other

## 2018-09-18 ENCOUNTER — Encounter (HOSPITAL_COMMUNITY): Payer: Self-pay

## 2018-09-18 DIAGNOSIS — S81801S Unspecified open wound, right lower leg, sequela: Secondary | ICD-10-CM | POA: Diagnosis not present

## 2018-09-18 DIAGNOSIS — M79661 Pain in right lower leg: Secondary | ICD-10-CM

## 2018-09-18 NOTE — Therapy (Signed)
[place], [name], [address] Phone: [phone]   Fax:  [phone]  Wound Care Therapy  Patient Details  Name: Michaela Simpson MRN: [id number] Date of Birth: 1932-03-31 Referring Provider (PT): Dr. Allyn Kenner   Encounter Date: 09/18/2018  PT End of Session - 09/18/18 0901    Visit Number  12    Number of Visits  16    Date for PT Re-Evaluation  10/02/18    Authorization Type  Medicare Part A & B    Authorization Time Period  08/03/18- 09/02/18; 09/04/2018 - 10/02/2018    Authorization - Visit Number  5    Authorization - Number of Visits  10    PT Start Time  0815    PT Stop Time  0845    PT Time Calculation (min)  30 min    Activity Tolerance  Patient tolerated treatment well    Behavior During Therapy  [facility] for tasks assessed/performed       Past Medical History:  Diagnosis Date  . Osteoporosis   . Rheumatoid arthritis [facility])     Past Surgical History:  Procedure Laterality Date  . INTRAMEDULLARY (IM) NAIL INTERTROCHANTERIC Right 03/14/2015   Procedure: RIGHT INTERTROCHANTRIC INTRAMEDULLARY (IM) NAIL ;  Surgeon: Mcarthur Rossetti, MD;  Location: [place];  Service: Orthopedics;  Laterality: Right;    There were no vitals filed for this visit.    Wound Therapy - 09/18/18 0854    Subjective  Patient arrives somewhat tearful reporting she has been having a hard few days. She states she fractured a rib while trying to hand a potted plant and that she is sore from that and that her leg has felt sore as well. She reports she is having a hard time with all that is going on around the world and staying more isolated at home. She lives alone and does not have anyone with her at home to have company and is tearful discussing this. She reports 3/10 pain at her leg around the wound.     Patient and Family Stated Goals  wound to heal     Date of Onset  05/05/18    Prior Treatments  self care, antibiotics     Pain Scale  0-10    Pain Score  3     Pain Type  Acute pain;Chronic pain    Pain Location  Leg    Pain Orientation  Right;Anterior;Mid    Pain Descriptors / Indicators  Aching;Sharp    Pain Onset  On-going    Patients Stated Pain Goal  0    Pain Intervention(s)  Distraction;Emotional support;Therapeutic touch    Multiple Pain Sites  No    Evaluation and Treatment Procedures Explained to Patient/Family  Yes    Evaluation and Treatment Procedures  agreed to    Wound Properties Date First Assessed: 08/03/18 Time First Assessed: 0949 Wound Type: Puncture Location: Leg Location Orientation: Right Present on Admission: Yes   Dressing Type  Alginate;Gauze (Comment)   silver hydrofiber, petroleum around margins   Dressing Changed  Changed    Dressing Status  Old drainage    Dressing Change Frequency  PRN    Site / Wound Assessment  Granulation tissue    % Wound base Red or Granulating  100%   100% bright pink   % Wound base Black/Eschar  0%    Peri-wound Assessment  Intact;Maceration   slight maceration along lateral boarder  Wound Length (cm)  3 cm   2.7   Wound Width (cm)  1.5 cm   1.1   Wound Depth (cm)  0.2 cm   0.2   Wound Volume (cm^3)  0.9 cm^3    Wound Surface Area (cm^2)  4.5 cm^2    Margins  Attached edges (approximated)    Drainage Amount  Moderate    Drainage Description  Serosanguineous    Treatment  Cleansed;Debridement (Selective);Tape changed    Selective Debridement - Location  areas in wound base that has slough and devitalized tissue.     Selective Debridement - Tools Used  Forceps    Selective Debridement - Tissue Removed  slough and devitalized tissues     Wound Therapy - Clinical Statement  Patient's wound presents with slightly increased size and some maceration around lateral wound margin. Xeroform discontinued to address excess moisture in wound bed and silver alginate applied with 2x2 and tape stretched overtop. Wound bed required minimal debridement of  slough around medial wound bed and base is 100% granulated tissue. Discontinued profore lite as patient no longer has edema present. Patient will continue to benefit from skilled wound care to promote healing environment and reduce infection risk.    Wound Therapy - Frequency  2X / week   4 additional weeks   Wound Therapy - Current Recommendations  PT    Wound Plan  Measure on Mondays. Cleanse and debride. Continue with appropriate topical agent to manage hypergranulation and moist environment.     Dressing   silver hydrofiber, 2x2, medipore tape        09/18/18 0902  PT Education  Education Details Educated to keep dressing dry but that it is ok for patient to shower. Educated to replace dressing if it becomes wet. Encouraged to use heating pad along ribs/side that is sore.  Person(s) Educated Patient  Methods Explanation  Comprehension Verbalized understanding     PT Short Term Goals - 09/04/18 1212      PT SHORT TERM GOAL #1   Title  PT wound to be 100% granulated to decrease cellulitis    Time  2    Period  Weeks    Status  Partially Met    Target Date  08/17/18      PT SHORT TERM GOAL #2   Title  Pt pain to be no greater than a 4/10 to allow pt to be able to tolerate standing/walking for up  to 30 minutes without increased pain for improved functional ability.     Time  2    Period  Weeks    Status  Achieved        PT Long Term Goals - 09/04/18 1212      PT LONG TERM GOAL #1   Title  Wound size to be decreased to no greater than 1.5x1.5 cm to allow pt to feel comfortable to complete self care for wound.     Time  4    Period  Weeks    Status  On-going      PT LONG TERM GOAL #2   Title  Pt to be able to vocalize the benefits of wearing compression garments for varicosities.     Time  4    Period  Weeks    Status  On-going        Plan - 09/18/18 0902    Clinical Impression Statement  see above    Rehab Potential  Good    PT Frequency  2x / week    PT  Duration  4 weeks    PT Treatment/Interventions  ADLs/Self Care Home Management;Patient/family education;Other (comment)   debridement and dressing change    PT Next Visit Plan  see above    Consulted and Agree with Plan of Care  Patient       Patient will benefit from skilled therapeutic intervention in order to improve the following deficits and impairments:  Decreased skin integrity, Pain  Visit Diagnosis: Pain in right lower leg  Open leg wound, right, sequela     Problem List Patient Active Problem List   Diagnosis Date Noted  . Rash and nonspecific skin eruption 04/06/2015  . Acute blood loss anemia 03/22/2015  . Edema 03/21/2015  . Acute renal failure syndrome ([place])   . Closed right hip fracture ([place]) 03/14/2015  . Rheumatoid arthritis ([place]) 03/14/2015  . Fall 03/14/2015    Kipp Brood, PT, DPT, [facility] Physical Therapist with [facility]  09/18/2018 9:02 AM    [place] [address] [place], [name], [address] Phone: [phone]   Fax:  [phone]  Name: Michaela Simpson MRN: [id number] Date of Birth: [birth year]

## 2018-09-19 ENCOUNTER — Ambulatory Visit (HOSPITAL_COMMUNITY): Payer: Medicare Other | Admitting: Physical Therapy

## 2018-09-20 ENCOUNTER — Ambulatory Visit (HOSPITAL_COMMUNITY): Payer: Medicare Other | Attending: Internal Medicine

## 2018-09-20 ENCOUNTER — Encounter (HOSPITAL_COMMUNITY): Payer: Self-pay

## 2018-09-20 ENCOUNTER — Other Ambulatory Visit: Payer: Self-pay

## 2018-09-20 DIAGNOSIS — M79661 Pain in right lower leg: Secondary | ICD-10-CM | POA: Insufficient documentation

## 2018-09-20 DIAGNOSIS — L97911 Non-pressure chronic ulcer of unspecified part of right lower leg limited to breakdown of skin: Secondary | ICD-10-CM | POA: Diagnosis not present

## 2018-09-20 DIAGNOSIS — L03115 Cellulitis of right lower limb: Secondary | ICD-10-CM | POA: Diagnosis not present

## 2018-09-20 DIAGNOSIS — S81801S Unspecified open wound, right lower leg, sequela: Secondary | ICD-10-CM | POA: Diagnosis not present

## 2018-09-20 NOTE — Therapy (Signed)
[place], [name], [address] Phone: [phone]   Fax:  [phone]  Wound Care Therapy  Patient Details  Name: Michaela Simpson MRN: [id number] Date of Birth: 1931/09/04 Referring Provider (PT): Dr. Allyn Kenner   Encounter Date: 09/20/2018  PT End of Session - 09/20/18 0856    Visit Number  13    Number of Visits  16    Date for PT Re-Evaluation  10/02/18    Authorization Type  Medicare Part A & B    Authorization Time Period  08/03/18- 09/02/18; 09/04/2018 - 10/02/2018    Authorization - Visit Number  6    Authorization - Number of Visits  10    PT Start Time  0815    PT Stop Time  0843    PT Time Calculation (min)  28 min    Activity Tolerance  Patient tolerated treatment well    Behavior During Therapy  [facility] for tasks assessed/performed       Past Medical History:  Diagnosis Date  . Osteoporosis   . Rheumatoid arthritis [facility])     Past Surgical History:  Procedure Laterality Date  . INTRAMEDULLARY (IM) NAIL INTERTROCHANTERIC Right 03/14/2015   Procedure: RIGHT INTERTROCHANTRIC INTRAMEDULLARY (IM) NAIL ;  Surgeon: Mcarthur Rossetti, MD;  Location: [place];  Service: Orthopedics;  Laterality: Right;    There were no vitals filed for this visit.      Wound Therapy - 09/20/18 0849    Subjective  Patient arrives with dressing intact and clean. She reports 2/10 pain today. She staes she took 2 shower and her dressing did not get wet.    Patient and Family Stated Goals  wound to heal     Date of Onset  05/05/18    Prior Treatments  self care, antibiotics     Pain Scale  0-10    Pain Score  2     Pain Type  Acute pain;Chronic pain    Pain Location  Leg    Pain Orientation  Right;Anterior    Pain Descriptors / Indicators  Aching    Pain Onset  On-going    Patients Stated Pain Goal  0    Pain Intervention(s)  Distraction;Emotional support;Therapeutic touch    Multiple Pain Sites  No    Evaluation  and Treatment Procedures Explained to Patient/Family  Yes    Evaluation and Treatment Procedures  agreed to    Wound Properties Date First Assessed: 08/03/18 Time First Assessed: 0949 Wound Type: Puncture Location: Leg Location Orientation: Right Present on Admission: Yes   Dressing Type  Alginate;Gauze (Comment);Impregnated gauze (bismuth)   silver hydrofiber, petroleum around margins   Dressing Changed  Changed    Dressing Status  Old drainage    Dressing Change Frequency  PRN    Site / Wound Assessment  Granulation tissue    % Wound base Red or Granulating  100%   100% bright pink   % Wound base Black/Eschar  0%    Peri-wound Assessment  Intact   slight maceration along lateral boarder   Margins  Attached edges (approximated)    Drainage Amount  Moderate    Drainage Description  Serosanguineous    Treatment  Cleansed;Debridement (Selective)    Selective Debridement - Location  areas in wound base that has slough and devitalized tissue.     Selective Debridement - Tools Used  Forceps    Selective Debridement - Tissue Removed  slough and  devitalized tissues     Wound Therapy - Clinical Statement  Patient's wound presents with reduction in wound size superiorly and skin bridge crossing medial-lateral between 50% superior wound and 50% inferior wound. Inferior wound bed continues to have greater depth and more drainage. Debridement performed to remove slough from inferior wound bed and dried slough from superior wound bed. Xeroform applied to superior portion and silver hydrofiber placed in inferior portion to fill depth with another layer overtop to absorb drainage. Patient's wound does present with redness around periwound and I believe she may benefit from additional antibiotics to address possible infection. Patient will continue to benefit from skilled wound care to promote healing environment and reduce infection risk.    Wound Therapy - Frequency  2X / week   4 additional weeks   Wound  Therapy - Current Recommendations  PT    Wound Plan  Follow up opn antibiotics. Measure on Mondays. Cleanse and debride. Continue with appropriate topical agent to manage hypergranulation and moist environment.     Dressing   xeroform, silver hydrofiber, 2x2, gauze wrap        PT Short Term Goals - 09/04/18 1212      PT SHORT TERM GOAL #1   Title  PT wound to be 100% granulated to decrease cellulitis    Time  2    Period  Weeks    Status  Partially Met    Target Date  08/17/18      PT SHORT TERM GOAL #2   Title  Pt pain to be no greater than a 4/10 to allow pt to be able to tolerate standing/walking for up  to 30 minutes without increased pain for improved functional ability.     Time  2    Period  Weeks    Status  Achieved        PT Long Term Goals - 09/04/18 1212      PT LONG TERM GOAL #1   Title  Wound size to be decreased to no greater than 1.5x1.5 cm to allow pt to feel comfortable to complete self care for wound.     Time  4    Period  Weeks    Status  On-going      PT LONG TERM GOAL #2   Title  Pt to be able to vocalize the benefits of wearing compression garments for varicosities.     Time  4    Period  Weeks    Status  On-going         Plan - 09/20/18 0857    Clinical Impression Statement  see above    Rehab Potential  Good    PT Frequency  2x / week    PT Duration  4 weeks    PT Treatment/Interventions  ADLs/Self Care Home Management;Patient/family education;Other (comment)   debridement and dressing change    PT Next Visit Plan  see above    Consulted and Agree with Plan of Care  Patient       Patient will benefit from skilled therapeutic intervention in order to improve the following deficits and impairments:  Decreased skin integrity, Pain  Visit Diagnosis: Pain in right lower leg  Open leg wound, right, sequela     Problem List Patient Active Problem List   Diagnosis Date Noted  . Rash and nonspecific skin eruption 04/06/2015  . Acute  blood loss anemia 03/22/2015  . Edema 03/21/2015  . Acute renal failure syndrome ([place])   .  Closed right hip fracture ([place]) 03/14/2015  . Rheumatoid arthritis ([place]) 03/14/2015  . Fall 03/14/2015    Kipp Brood, PT, DPT, [facility] Physical Therapist with [facility]  09/20/2018 8:57 AM    [place] [address] [place], [name], [address] Phone: [phone]   Fax:  [phone]  Name: Michaela Simpson MRN: [id number] Date of Birth: 04-27-1932

## 2018-09-21 ENCOUNTER — Ambulatory Visit (HOSPITAL_COMMUNITY): Payer: Medicare Other | Admitting: Physical Therapy

## 2018-09-25 ENCOUNTER — Ambulatory Visit (HOSPITAL_COMMUNITY): Payer: Medicare Other

## 2018-09-25 ENCOUNTER — Encounter (HOSPITAL_COMMUNITY): Payer: Self-pay

## 2018-09-25 ENCOUNTER — Other Ambulatory Visit: Payer: Self-pay

## 2018-09-25 DIAGNOSIS — S81801S Unspecified open wound, right lower leg, sequela: Secondary | ICD-10-CM | POA: Diagnosis not present

## 2018-09-25 DIAGNOSIS — M79661 Pain in right lower leg: Secondary | ICD-10-CM | POA: Diagnosis not present

## 2018-09-25 NOTE — Therapy (Signed)
Michaela Simpson, [name], [address] Phone: [phone]   Fax:  [phone]  Wound Care Therapy/Progress Note  Patient Details  Name: Michaela Simpson MRN: [id number] Date of Birth: Nov 25, 1931 Referring Provider (PT): Dr. Allyn Kenner   Encounter Date: 09/25/2018  PT End of Session - 09/25/18 0853    Visit Number  14    Number of Visits  32    Date for PT Re-Evaluation  10/02/18    Authorization Type  Medicare Part A & B    Authorization Time Period  08/03/18- 09/02/18; 09/04/2018 - 10/02/2018; new 09/25/18-10/16/18    Authorization - Visit Number  1    Authorization - Number of Visits  10    PT Start Time  0820    PT Stop Time  3491    PT Time Calculation (min)  24 min    Activity Tolerance  Patient tolerated treatment well    Behavior During Therapy  [facility] for tasks assessed/performed       Past Medical History:  Diagnosis Date  . Osteoporosis   . Rheumatoid arthritis Michaela Simpson)     Past Surgical History:  Procedure Laterality Date  . INTRAMEDULLARY (IM) NAIL INTERTROCHANTERIC Right 03/14/2015   Procedure: RIGHT INTERTROCHANTRIC INTRAMEDULLARY (IM) NAIL ;  Surgeon: Mcarthur Rossetti, MD;  Location: [place];  Service: Orthopedics;  Laterality: Right;    There were no vitals filed for this visit.     Wound Therapy - 09/25/18 0844    Subjective  Patient arrives with dressing changed to gauze wrap as she got it wet last Thursday. The tissue is elevated around wound margins and wound is wet and draining minimally. She is on antibiotics for another 2 days.    Patient and Family Stated Goals  wound to heal     Date of Onset  05/05/18    Prior Treatments  self care, antibiotics     Pain Scale  0-10    Pain Score  2     Pain Type  Acute pain;Chronic pain    Pain Location  Leg    Pain Orientation  Right;Anterior    Pain Descriptors / Indicators  Aching    Pain Onset  On-going    Patients Stated Pain Goal  0    Pain  Intervention(s)  Distraction;Emotional support;Therapeutic touch    Multiple Pain Sites  No    Evaluation and Treatment Procedures Explained to Patient/Family  Yes    Evaluation and Treatment Procedures  agreed to    Wound Properties Date First Assessed: 08/03/18 Time First Assessed: 0949 Wound Type: Puncture Location: Leg Location Orientation: Right Present on Admission: Yes   Dressing Type  Alginate;Gauze (Comment);Silver hydrofiber   silver hydrofiber, alginate, petroleum around margins   Dressing Changed  Changed    Dressing Status  Old drainage    Dressing Change Frequency  PRN    Site / Wound Assessment  Granulation tissue    % Wound base Red or Granulating  100%   100% bright pink   % Wound base Black/Eschar  0%    Peri-wound Assessment  Intact   slight maceration along lateral boarder   Wound Length (cm)  2.1 cm   3   Wound Width (cm)  1.2 cm   1.5   Wound Depth (cm)  0.3 cm   0.2   Wound Volume (cm^3)  0.76 cm^3    Wound Surface Area (cm^2)  2.52 cm^2  Margins  Attached edges (approximated)    Drainage Amount  Moderate    Drainage Description  Serosanguineous    Treatment  Cleansed;Debridement (Selective)    Selective Debridement - Location  areas in wound base that has slough and devitalized tissue.     Selective Debridement - Tools Used  Forceps    Selective Debridement - Tissue Removed  slough and devitalized tissues     Wound Therapy - Clinical Statement  showering last Thursday. Wound drainage is moderate and margins of wound are elevated with edema and red and I believe she will benefit from additional antibiotics. Wound has reduced size considerably from last week however due to drainage and ongoing need for compression to wound to prevent hypergranulation I am increasing patient's frequency to 3x/week. Debridement performed to remove slough from inferior wound bed and dried slough from superior wound bed. Silver hydrofiber continued to wound bed and alginate applied  overtop to address drainage and manage moisture. Patient will continue to benefit from skilled wound care to promote healing environment and reduce infection risk.    Wound Therapy - Frequency  3X / week   4 additional weeks   Wound Therapy - Current Recommendations  PT    Wound Plan  Follow up on additional antibiotics. Measure on Mondays. Cleanse and debride. Continue with appropriate topical agent to manage hypergranulation and moist environment.     Dressing   silver hydrofiber, alginate, 2x2, gauze wrap         PT Short Term Goals - 09/04/18 1212      PT SHORT TERM GOAL #1   Title  PT wound to be 100% granulated to decrease cellulitis    Time  2    Period  Weeks    Status  Partially Met    Target Date  08/17/18      PT SHORT TERM GOAL #2   Title  Pt pain to be no greater than a 4/10 to allow pt to be able to tolerate standing/walking for up  to 30 minutes without increased pain for improved functional ability.     Time  2    Period  Weeks    Status  Achieved        PT Long Term Goals - 09/04/18 1212      PT LONG TERM GOAL #1   Title  Wound size to be decreased to no greater than 1.5x1.5 cm to allow pt to feel comfortable to complete self care for wound.     Time  4    Period  Weeks    Status  On-going      PT LONG TERM GOAL #2   Title  Pt to be able to vocalize the benefits of wearing compression garments for varicosities.     Time  4    Period  Weeks    Status  On-going         Plan - 09/25/18 0855    Clinical Impression Statement  see above    Rehab Potential  Good    PT Frequency  3x / week    PT Duration  3 weeks   3 more   PT Treatment/Interventions  ADLs/Self Care Home Management;Patient/family education;Other (comment)   debridement and dressing change    PT Next Visit Plan  see above    Consulted and Agree with Plan of Care  Patient       Patient will benefit from skilled therapeutic intervention in order to improve the  following deficits and  impairments:  Decreased skin integrity, Pain  Visit Diagnosis: Pain in right lower leg  Open leg wound, right, sequela     Problem List Patient Active Problem List   Diagnosis Date Noted  . Rash and nonspecific skin eruption 04/06/2015  . Acute blood loss anemia 03/22/2015  . Edema 03/21/2015  . Acute renal failure syndrome ([place])   . Closed right hip fracture ([place]) 03/14/2015  . Rheumatoid arthritis ([place]) 03/14/2015  . Fall 03/14/2015    Kipp Brood, PT, DPT, [facility] Physical Therapist with [facility]  09/25/2018 8:56 AM    [place] [address] [place], [name], [address] Phone: [phone]   Fax:  [phone]  Name: Michaela Simpson MRN: [id number] Date of Birth: [birth year]

## 2018-09-26 ENCOUNTER — Ambulatory Visit (HOSPITAL_COMMUNITY): Payer: Medicare Other | Admitting: Physical Therapy

## 2018-09-27 ENCOUNTER — Ambulatory Visit (HOSPITAL_COMMUNITY): Payer: Medicare Other | Admitting: Physical Therapy

## 2018-09-27 ENCOUNTER — Other Ambulatory Visit: Payer: Self-pay

## 2018-09-27 DIAGNOSIS — M79661 Pain in right lower leg: Secondary | ICD-10-CM | POA: Diagnosis not present

## 2018-09-27 DIAGNOSIS — S81801S Unspecified open wound, right lower leg, sequela: Secondary | ICD-10-CM

## 2018-09-27 NOTE — Therapy (Signed)
[place], [name], [address] Phone: [phone]   Fax:  [phone]  Wound Care Therapy  Patient Details  Name: Michaela Simpson MRN: [id number] Date of Birth: [birth year] Referring Provider (PT): Dr. Allyn Kenner   Encounter Date: 09/27/2018  PT End of Session - 09/27/18 1353    Visit Number  15    Number of Visits  32    Date for PT Re-Evaluation  10/02/18    Authorization Type  Medicare Part A & B    Authorization Time Period  08/03/18- 09/02/18; 09/04/2018 - 10/02/2018; new 09/25/18-10/16/18    Authorization - Visit Number  2    Authorization - Number of Visits  10    PT Start Time  0950    PT Stop Time  1015    PT Time Calculation (min)  25 min    Activity Tolerance  Patient tolerated treatment well    Behavior During Therapy  [facility] for tasks assessed/performed       Past Medical History:  Diagnosis Date  . Osteoporosis   . Rheumatoid arthritis [facility])     Past Surgical History:  Procedure Laterality Date  . INTRAMEDULLARY (IM) NAIL INTERTROCHANTERIC Right 03/14/2015   Procedure: RIGHT INTERTROCHANTRIC INTRAMEDULLARY (IM) NAIL ;  Surgeon: Mcarthur Rossetti, MD;  Location: [place];  Service: Orthopedics;  Laterality: Right;    There were no vitals filed for this visit.              Wound Therapy - 09/27/18 1349    Subjective  pt states shes hoping it will be healed soon.  No issues or pain.    Patient and Family Stated Goals  wound to heal     Date of Onset  05/05/18    Prior Treatments  self care, antibiotics     Pain Scale  0-10    Pain Score  0-No pain    Evaluation and Treatment Procedures Explained to Patient/Family  Yes    Evaluation and Treatment Procedures  agreed to    Wound Properties Date First Assessed: 08/03/18 Time First Assessed: 0949 Wound Type: Puncture Location: Leg Location Orientation: Right Present on Admission: Yes   Dressing Type  Alginate;Gauze (Comment);Silver  hydrofiber   silver hydrofiber, alginate, petroleum around margins   Dressing Changed  Changed    Dressing Status  Old drainage    Dressing Change Frequency  PRN    Site / Wound Assessment  Granulation tissue    % Wound base Red or Granulating  100%   100% bright pink   % Wound base Black/Eschar  0%    Peri-wound Assessment  Intact   slight maceration along lateral boarder   Wound Length (cm)  2.1 cm    Wound Width (cm)  0.8 cm    Wound Depth (cm)  0.2 cm    Wound Volume (cm^3)  0.34 cm^3    Wound Surface Area (cm^2)  1.68 cm^2    Margins  Attached edges (approximated)    Drainage Amount  Minimal    Drainage Description  Serosanguineous    Treatment  Cleansed;Debridement (Selective)    Selective Debridement - Location  wound bed, perimeter    Selective Debridement - Tools Used  Forceps    Selective Debridement - Tissue Removed  slough and devitalized tissues     Wound Therapy - Clinical Statement  noted improvement and approximation since last visit.  Dressing very adherent to wound, changed back to  xeroform and will evaluate if need to return to silver next session.  Able to remove all devitalized tissue from periemeter of wound.  Redness reduced this session.     Wound Therapy - Frequency  3X / week   4 additional weeks   Wound Therapy - Current Recommendations  PT    Wound Plan  continue with wound care and appropriate dressings.    Dressing   silver hydrofiber, alginate, 2x2, gauze wrap                PT Short Term Goals - 09/04/18 1212      PT SHORT TERM GOAL #1   Title  PT wound to be 100% granulated to decrease cellulitis    Time  2    Period  Weeks    Status  Partially Met    Target Date  08/17/18      PT SHORT TERM GOAL #2   Title  Pt pain to be no greater than a 4/10 to allow pt to be able to tolerate standing/walking for up  to 30 minutes without increased pain for improved functional ability.     Time  2    Period  Weeks    Status  Achieved         PT Long Term Goals - 09/04/18 1212      PT LONG TERM GOAL #1   Title  Wound size to be decreased to no greater than 1.5x1.5 cm to allow pt to feel comfortable to complete self care for wound.     Time  4    Period  Weeks    Status  On-going      PT LONG TERM GOAL #2   Title  Pt to be able to vocalize the benefits of wearing compression garments for varicosities.     Time  4    Period  Weeks    Status  On-going            Plan - 09/27/18 1352    Rehab Potential  Good    PT Frequency  3x / week    PT Duration  3 weeks   3 more   PT Treatment/Interventions  ADLs/Self Care Home Management;Patient/family education;Other (comment)   debridement and dressing change    PT Next Visit Plan  see above    Consulted and Agree with Plan of Care  Patient       Patient will benefit from skilled therapeutic intervention in order to improve the following deficits and impairments:  Decreased skin integrity, Pain  Visit Diagnosis: Pain in right lower leg  Open leg wound, right, sequela     Problem List Patient Active Problem List   Diagnosis Date Noted  . Rash and nonspecific skin eruption 04/06/2015  . Acute blood loss anemia 03/22/2015  . Edema 03/21/2015  . Acute renal failure syndrome ([place])   . Closed right hip fracture ([place]) 03/14/2015  . Rheumatoid arthritis ([place]) 03/14/2015  . Fall 03/14/2015   Michaela Simpson, PTA/CLT [phone]  Michaela Simpson 09/27/2018, 1:54 PM  [place] [address] [place], [name], [address] Phone: [phone]   Fax:  [phone]  Name: Michaela Simpson MRN: [id number] Date of Birth: July 12, 1931

## 2018-09-28 ENCOUNTER — Ambulatory Visit (HOSPITAL_COMMUNITY): Payer: Medicare Other | Admitting: Physical Therapy

## 2018-09-29 ENCOUNTER — Encounter (HOSPITAL_COMMUNITY): Payer: Self-pay

## 2018-09-29 ENCOUNTER — Ambulatory Visit (HOSPITAL_COMMUNITY): Payer: Medicare Other

## 2018-09-29 ENCOUNTER — Other Ambulatory Visit: Payer: Self-pay

## 2018-09-29 DIAGNOSIS — M79661 Pain in right lower leg: Secondary | ICD-10-CM

## 2018-09-29 DIAGNOSIS — S81801S Unspecified open wound, right lower leg, sequela: Secondary | ICD-10-CM | POA: Diagnosis not present

## 2018-09-29 NOTE — Therapy (Signed)
[place], [name], [address] Phone: [phone]   Fax:  [phone]  Wound Care Therapy  Patient Details  Name: Michaela Simpson MRN: [id number] Date of Birth: Mar 26, 1932 Referring Provider (PT): Dr. Allyn Kenner   Encounter Date: 09/29/2018  PT End of Session - 09/29/18 1205    Visit Number  16    Number of Visits  32    Date for PT Re-Evaluation  10/02/18    Authorization Type  Medicare Part A & B    Authorization Time Period  08/03/18- 09/02/18; 09/04/2018 - 10/02/2018; new 09/25/18-10/16/18    Authorization - Visit Number  3    Authorization - Number of Visits  10    PT Start Time  1120    PT Stop Time  1145    PT Time Calculation (min)  25 min    Activity Tolerance  Patient tolerated treatment well    Behavior During Therapy  [facility] for tasks assessed/performed       Past Medical History:  Diagnosis Date  . Osteoporosis   . Rheumatoid arthritis [facility])     Past Surgical History:  Procedure Laterality Date  . INTRAMEDULLARY (IM) NAIL INTERTROCHANTERIC Right 03/14/2015   Procedure: RIGHT INTERTROCHANTRIC INTRAMEDULLARY (IM) NAIL ;  Surgeon: Mcarthur Rossetti, MD;  Location: [place];  Service: Orthopedics;  Laterality: Right;    There were no vitals filed for this visit.    Wound Therapy - 09/29/18 1159    Subjective  Patient arrives with dressing intact and is doing well today. She denies pain today and reports she has started her new dose of antibiotics.    Patient and Family Stated Goals  wound to heal     Date of Onset  05/05/18    Prior Treatments  self care, antibiotics     Pain Scale  0-10    Pain Score  0-No pain    Evaluation and Treatment Procedures Explained to Patient/Family  Yes    Evaluation and Treatment Procedures  agreed to    Wound Properties Date First Assessed: 08/03/18 Time First Assessed: 0949 Wound Type: Puncture Location: Leg Location Orientation: Right Present on Admission: Yes    Dressing Type  Alginate;Gauze (Comment);Silver hydrofiber;Impregnated gauze (bismuth)   xeroform, alginate, petroleum around margins   Dressing Changed  Changed    Dressing Status  Old drainage    Dressing Change Frequency  PRN    Site / Wound Assessment  Granulation tissue    % Wound base Red or Granulating  100%   100% bright pink   % Wound base Black/Eschar  0%    Peri-wound Assessment  Intact   slight maceration along lateral boarder   Margins  Attached edges (approximated)    Drainage Amount  Minimal    Drainage Description  Serosanguineous    Treatment  Cleansed;Debridement (Selective)    Selective Debridement - Location  wound bed, perimeter    Selective Debridement - Tools Used  Forceps    Selective Debridement - Tissue Removed  slough and devitalized tissues     Wound Therapy - Clinical Statement  Patients wound presents with good approximation and new/fragile skin healing over superior portion of wound. Reduced erythema present at periwound however it continues to be raised and edematous. Slough was easily debrided from wound bed and dried exudate removed form margins as well. Continued with xeroform to wound bed and alginate overtop. Gauze and medipore tape continued over wound dressing. She  will continue to benefit from skilled wound care to promote healing and reduce infection.     Wound Therapy - Frequency  3X / week   4 additional weeks   Wound Therapy - Current Recommendations  PT    Wound Plan  continue with wound care and appropriate dressings.    Dressing   xeroform, alginate, 2x2, medipore tape         PT Short Term Goals - 09/04/18 1212      PT SHORT TERM GOAL #1   Title  PT wound to be 100% granulated to decrease cellulitis    Time  2    Period  Weeks    Status  Partially Met    Target Date  08/17/18      PT SHORT TERM GOAL #2   Title  Pt pain to be no greater than a 4/10 to allow pt to be able to tolerate standing/walking for up  to 30 minutes without  increased pain for improved functional ability.     Time  2    Period  Weeks    Status  Achieved        PT Long Term Goals - 09/04/18 1212      PT LONG TERM GOAL #1   Title  Wound size to be decreased to no greater than 1.5x1.5 cm to allow pt to feel comfortable to complete self care for wound.     Time  4    Period  Weeks    Status  On-going      PT LONG TERM GOAL #2   Title  Pt to be able to vocalize the benefits of wearing compression garments for varicosities.     Time  4    Period  Weeks    Status  On-going       Plan - 09/29/18 1205    Clinical Impression Statement  see above    Rehab Potential  Good    PT Frequency  3x / week    PT Duration  3 weeks   3 more   PT Treatment/Interventions  ADLs/Self Care Home Management;Patient/family education;Other (comment)   debridement and dressing change    PT Next Visit Plan  see above    Consulted and Agree with Plan of Care  Patient       Patient will benefit from skilled therapeutic intervention in order to improve the following deficits and impairments:  Decreased skin integrity, Pain  Visit Diagnosis: Pain in right lower leg  Open leg wound, right, sequela     Problem List Patient Active Problem List   Diagnosis Date Noted  . Rash and nonspecific skin eruption 04/06/2015  . Acute blood loss anemia 03/22/2015  . Edema 03/21/2015  . Acute renal failure syndrome ([place])   . Closed right hip fracture ([place]) 03/14/2015  . Rheumatoid arthritis ([place]) 03/14/2015  . Fall 03/14/2015    Kipp Brood, PT, DPT, [facility] Physical Therapist with [facility]  09/29/2018 12:06 PM    [place], [name], [address] Phone: [phone]   Fax:  [phone]  Name: Michaela Simpson MRN: [id number] Date of Birth: 27-Oct-1931

## 2018-10-02 ENCOUNTER — Ambulatory Visit (HOSPITAL_COMMUNITY): Payer: Medicare Other | Admitting: Physical Therapy

## 2018-10-02 ENCOUNTER — Other Ambulatory Visit: Payer: Self-pay

## 2018-10-02 DIAGNOSIS — M79661 Pain in right lower leg: Secondary | ICD-10-CM

## 2018-10-02 DIAGNOSIS — S81801S Unspecified open wound, right lower leg, sequela: Secondary | ICD-10-CM | POA: Diagnosis not present

## 2018-10-02 NOTE — Therapy (Signed)
[place], [name], [address] Phone: [phone]   Fax:  [phone]  Wound Care Therapy  Patient Details  Name: Michaela Simpson MRN: [id number] Date of Birth: [birth year] Referring Provider (PT): Dr. Allyn Kenner   Encounter Date: 10/02/2018  PT End of Session - 10/02/18 1029    Visit Number  17    Number of Visits  32    Date for PT Re-Evaluation  10/02/18    Authorization Type  Medicare Part A & B    Authorization Time Period  08/03/18- 09/02/18; 09/04/2018 - 10/02/2018; new 09/25/18-10/16/18    Authorization - Visit Number  4    Authorization - Number of Visits  10    PT Start Time  0945    PT Stop Time  1005    PT Time Calculation (min)  20 min    Activity Tolerance  Patient tolerated treatment well    Behavior During Therapy  [facility] for tasks assessed/performed       Past Medical History:  Diagnosis Date  . Osteoporosis   . Rheumatoid arthritis [facility])     Past Surgical History:  Procedure Laterality Date  . INTRAMEDULLARY (IM) NAIL INTERTROCHANTERIC Right 03/14/2015   Procedure: RIGHT INTERTROCHANTRIC INTRAMEDULLARY (IM) NAIL ;  Surgeon: Mcarthur Rossetti, MD;  Location: [facility];  Service: Orthopedics;  Laterality: Right;    There were no vitals filed for this visit.              Wound Therapy - 10/02/18 1026    Subjective  pt states her leg is much better, no pain    Patient and Family Stated Goals  wound to heal     Date of Onset  05/05/18    Prior Treatments  self care, antibiotics     Pain Scale  0-10    Pain Score  0-No pain    Evaluation and Treatment Procedures Explained to Patient/Family  Yes    Evaluation and Treatment Procedures  agreed to    Wound Properties Date First Assessed: 08/03/18 Time First Assessed: 0949 Wound Type: Puncture Location: Leg Location Orientation: Right Present on Admission: Yes   Dressing Type  Alginate;Gauze (Comment);Silver hydrofiber;Impregnated gauze  (bismuth)   xeroform, alginate, petroleum around margins   Dressing Changed  Changed    Dressing Status  Old drainage    Dressing Change Frequency  PRN    Site / Wound Assessment  Granulation tissue    % Wound base Red or Granulating  100%   100% bright pink   % Wound base Black/Eschar  0%    Peri-wound Assessment  Intact   slight maceration along lateral boarder   Margins  Attached edges (approximated)    Drainage Amount  Minimal    Drainage Description  Serosanguineous    Treatment  Cleansed;Debridement (Selective)    Selective Debridement - Location  wound bed, perimeter    Selective Debridement - Tools Used  Forceps    Selective Debridement - Tissue Removed  slough and devitalized tissues     Wound Therapy - Clinical Statement  able to debride devitalized tissue from perimeter of wound to reveal approximation of all borders.  Removed tiny bit of sloth to return to 100% granulation.  Moisture balance working well with xeroform and CaAg.      Wound Therapy - Frequency  3X / week   4 additional weeks   Wound Therapy - Current Recommendations  PT    Wound  Plan  continue with wound care and appropriate dressings. Measure weekly.     Dressing   xeroform, alginate, 2x2, medipore tape                PT Short Term Goals - 09/04/18 1212      PT SHORT TERM GOAL #1   Title  PT wound to be 100% granulated to decrease cellulitis    Time  2    Period  Weeks    Status  Partially Met    Target Date  08/17/18      PT SHORT TERM GOAL #2   Title  Pt pain to be no greater than a 4/10 to allow pt to be able to tolerate standing/walking for up  to 30 minutes without increased pain for improved functional ability.     Time  2    Period  Weeks    Status  Achieved        PT Long Term Goals - 09/04/18 1212      PT LONG TERM GOAL #1   Title  Wound size to be decreased to no greater than 1.5x1.5 cm to allow pt to feel comfortable to complete self care for wound.     Time  4     Period  Weeks    Status  On-going      PT LONG TERM GOAL #2   Title  Pt to be able to vocalize the benefits of wearing compression garments for varicosities.     Time  4    Period  Weeks    Status  On-going              Patient will benefit from skilled therapeutic intervention in order to improve the following deficits and impairments:     Visit Diagnosis: Pain in right lower leg  Open leg wound, right, sequela     Problem List Patient Active Problem List   Diagnosis Date Noted  . Rash and nonspecific skin eruption 04/06/2015  . Acute blood loss anemia 03/22/2015  . Edema 03/21/2015  . Acute renal failure syndrome ([place])   . Closed right hip fracture ([place]) 03/14/2015  . Rheumatoid arthritis ([place]) 03/14/2015  . Fall 03/14/2015   Teena Irani, PTA/CLT [phone]  Teena Irani 10/02/2018, 10:30 AM  [place], [name], [address] Phone: [phone]   Fax:  [phone]  Name: MEIKO IVES MRN: [id number] Date of Birth: [birth year]

## 2018-10-04 ENCOUNTER — Ambulatory Visit (HOSPITAL_COMMUNITY): Payer: Medicare Other | Admitting: Physical Therapy

## 2018-10-04 ENCOUNTER — Other Ambulatory Visit: Payer: Self-pay

## 2018-10-04 DIAGNOSIS — S81801S Unspecified open wound, right lower leg, sequela: Secondary | ICD-10-CM

## 2018-10-04 DIAGNOSIS — M79661 Pain in right lower leg: Secondary | ICD-10-CM

## 2018-10-04 NOTE — Therapy (Signed)
[place], [name], [address] Phone: [phone]   Fax:  [phone]  Wound Care Therapy  Patient Details  Name: Michaela Simpson MRN: [id number] Date of Birth: [birth year] Referring Provider (PT): Dr. Allyn Kenner   Encounter Date: 10/04/2018  PT End of Session - 10/04/18 1223    Visit Number  18   progress note completed visit #16   Number of Visits  32    Date for PT Re-Evaluation  10/16/18    Authorization Type  Medicare Part A & B    Authorization Time Period  08/03/18- 09/02/18; 09/04/2018 - 10/02/2018; new 09/25/18-10/16/18    Authorization - Visit Number  2    Authorization - Number of Visits  10    PT Start Time  1000    PT Stop Time  1025    PT Time Calculation (min)  25 min    Activity Tolerance  Patient tolerated treatment well    Behavior During Therapy  [facility] for tasks assessed/performed       Past Medical History:  Diagnosis Date  . Osteoporosis   . Rheumatoid arthritis [facility])     Past Surgical History:  Procedure Laterality Date  . INTRAMEDULLARY (IM) NAIL INTERTROCHANTERIC Right 03/14/2015   Procedure: RIGHT INTERTROCHANTRIC INTRAMEDULLARY (IM) NAIL ;  Surgeon: Mcarthur Rossetti, MD;  Location: [facility];  Service: Orthopedics;  Laterality: Right;    There were no vitals filed for this visit.              Wound Therapy - 10/04/18 1219    Subjective  pt reports no issues or pain    Patient and Family Stated Goals  wound to heal     Date of Onset  05/05/18    Prior Treatments  self care, antibiotics     Evaluation and Treatment Procedures Explained to Patient/Family  Yes    Evaluation and Treatment Procedures  agreed to    Wound Properties Date First Assessed: 08/03/18 Time First Assessed: 0949 Wound Type: Puncture Location: Leg Location Orientation: Right Present on Admission: Yes   Dressing Type  Alginate;Gauze (Comment);Silver hydrofiber;Impregnated gauze (bismuth)   xeroform,  alginate, petroleum around margins   Dressing Changed  Changed    Dressing Status  Old drainage    Dressing Change Frequency  PRN    Site / Wound Assessment  Granulation tissue    % Wound base Red or Granulating  100%   100% bright pink   % Wound base Black/Eschar  0%    Peri-wound Assessment  Intact   slight maceration along lateral boarder   Wound Length (cm)  2 cm   was 2.1   Wound Width (cm)  0.6 cm   was 0.8   Wound Depth (cm)  0.1 cm   was 0.2   Wound Volume (cm^3)  0.12 cm^3    Wound Surface Area (cm^2)  1.2 cm^2    Margins  Attached edges (approximated)    Drainage Amount  Minimal    Drainage Description  Serosanguineous    Treatment  Debridement (Selective);Cleansed    Selective Debridement - Location  wound bed, perimeter    Selective Debridement - Tools Used  Forceps    Selective Debridement - Tissue Removed  slough and devitalized tissues     Wound Therapy - Clinical Statement  perimeter of wound with more devitalized tissue and noted hypergranlulation along inferior border.  Cleansed well and debrided tissue from border.  Remeasured  with slight improvment from last week measurements, however still not healing as quickly as would like.  Cut xerform smaller and placed inside wound margins.  Applied vaseline to edges to prevent maceration and applied medipore at stretch over dressing to increase slight compression to improve hypergranulation.      Wound Therapy - Frequency  3X / week   4 additional weeks   Wound Therapy - Current Recommendations  PT    Wound Plan  continue with wound care and appropriate dressings. Measure weekly.     Dressing   xeroform, alginate, 2x2, medipore tape                PT Short Term Goals - 09/04/18 1212      PT SHORT TERM GOAL #1   Title  PT wound to be 100% granulated to decrease cellulitis    Time  2    Period  Weeks    Status  Partially Met    Target Date  08/17/18      PT SHORT TERM GOAL #2   Title  Pt pain to be no  greater than a 4/10 to allow pt to be able to tolerate standing/walking for up  to 30 minutes without increased pain for improved functional ability.     Time  2    Period  Weeks    Status  Achieved        PT Long Term Goals - 09/04/18 1212      PT LONG TERM GOAL #1   Title  Wound size to be decreased to no greater than 1.5x1.5 cm to allow pt to feel comfortable to complete self care for wound.     Time  4    Period  Weeks    Status  On-going      PT LONG TERM GOAL #2   Title  Pt to be able to vocalize the benefits of wearing compression garments for varicosities.     Time  4    Period  Weeks    Status  On-going              Patient will benefit from skilled therapeutic intervention in order to improve the following deficits and impairments:     Visit Diagnosis: Pain in right lower leg  Open leg wound, right, sequela     Problem List Patient Active Problem List   Diagnosis Date Noted  . Rash and nonspecific skin eruption 04/06/2015  . Acute blood loss anemia 03/22/2015  . Edema 03/21/2015  . Acute renal failure syndrome ([place])   . Closed right hip fracture ([place]) 03/14/2015  . Rheumatoid arthritis ([place]) 03/14/2015  . Fall 03/14/2015   Teena Irani, PTA/CLT [phone]  Teena Irani 10/04/2018, 12:25 PM  [place] [address] [place], [name], [address] Phone: [phone]   Fax:  [phone]  Name: Michaela Simpson MRN: [id number] Date of Birth: 06-18-1932

## 2018-10-06 ENCOUNTER — Other Ambulatory Visit: Payer: Self-pay

## 2018-10-06 ENCOUNTER — Encounter (HOSPITAL_COMMUNITY): Payer: Self-pay

## 2018-10-06 ENCOUNTER — Ambulatory Visit (HOSPITAL_COMMUNITY): Payer: Medicare Other

## 2018-10-06 DIAGNOSIS — M79661 Pain in right lower leg: Secondary | ICD-10-CM

## 2018-10-06 DIAGNOSIS — S81801S Unspecified open wound, right lower leg, sequela: Secondary | ICD-10-CM | POA: Diagnosis not present

## 2018-10-06 NOTE — Therapy (Signed)
[place], [name], [address] Phone: [phone]   Fax:  [phone]  Wound Care Therapy  Patient Details  Name: Michaela Simpson MRN: [id number] Date of Birth: 02/06/1932 Referring Provider (PT): Dr. Allyn Kenner   Encounter Date: 10/06/2018  PT End of Session - 10/06/18 1202    Visit Number  19   progress note completed visit #16   Number of Visits  32    Date for PT Re-Evaluation  10/16/18    Authorization Type  Medicare Part A & B    Authorization Time Period  08/03/18- 09/02/18; 09/04/2018 - 10/02/2018; new 09/25/18-10/16/18    Authorization - Visit Number  3    Authorization - Number of Visits  10    PT Start Time  1130    PT Stop Time  1148    PT Time Calculation (min)  18 min    Activity Tolerance  Patient tolerated treatment well    Behavior During Therapy  [facility] for tasks assessed/performed       Past Medical History:  Diagnosis Date  . Osteoporosis   . Rheumatoid arthritis [facility])     Past Surgical History:  Procedure Laterality Date  . INTRAMEDULLARY (IM) NAIL INTERTROCHANTERIC Right 03/14/2015   Procedure: RIGHT INTERTROCHANTRIC INTRAMEDULLARY (IM) NAIL ;  Surgeon: Mcarthur Rossetti, MD;  Location: [facility];  Service: Orthopedics;  Laterality: Right;    There were no vitals filed for this visit.    Wound Therapy - 10/06/18 1155    Subjective  Patient arrives with dressing intact. She added a pice of tape to one side that was peeling up.     Patient and Family Stated Goals  wound to heal     Date of Onset  05/05/18    Prior Treatments  self care, antibiotics     Pain Scale  0-10    Pain Score  0-No pain    Evaluation and Treatment Procedures Explained to Patient/Family  Yes    Evaluation and Treatment Procedures  agreed to    Wound Properties Date First Assessed: 08/03/18 Time First Assessed: 0949 Wound Type: Puncture Location: Leg Location Orientation: Right Present on Admission: Yes    Dressing Type  Alginate;Gauze (Comment);Silver hydrofiber;Impregnated gauze (bismuth)   xeroform, alginate, petroleum around margins   Dressing Changed  Changed    Dressing Status  Old drainage    Dressing Change Frequency  PRN    Site / Wound Assessment  Granulation tissue    % Wound base Red or Granulating  100%   100% bright pink   % Wound base Black/Eschar  0%    Peri-wound Assessment  Intact   slight maceration along lateral boarder   Margins  Attached edges (approximated)    Drainage Amount  Minimal    Drainage Description  Serosanguineous    Treatment  Cleansed;Debridement (Selective)    Selective Debridement - Location  wound bed, perimeter    Selective Debridement - Tools Used  Forceps    Selective Debridement - Tissue Removed  slough and devitalized tissues     Wound Therapy - Clinical Statement  Patient's wound presents with purulent exudate on old dressing. Slough was present in wound bed and more easily debrided than prior sessions. Wound continues to be tender and is not healing as quickly as would like. Continued with xeroform to wound bed and alginate overtop with vaseline around periowound to prevent maceration. Continued with medipore tape stretched over top to  apply slight pressure and prevent hypergranulation. Patient will continue to benefit from skilled wound care to promote healing.    Wound Therapy - Frequency  3X / week   4 additional weeks   Wound Therapy - Current Recommendations  PT    Wound Plan  continue with wound care and appropriate dressings. Measure weekly.     Dressing   xeroform, alginate, 2x2, medipore tape        PT Short Term Goals - 09/04/18 1212      PT SHORT TERM GOAL #1   Title  PT wound to be 100% granulated to decrease cellulitis    Time  2    Period  Weeks    Status  Partially Met    Target Date  08/17/18      PT SHORT TERM GOAL #2   Title  Pt pain to be no greater than a 4/10 to allow pt to be able to tolerate standing/walking for  up  to 30 minutes without increased pain for improved functional ability.     Time  2    Period  Weeks    Status  Achieved        PT Long Term Goals - 09/04/18 1212      PT LONG TERM GOAL #1   Title  Wound size to be decreased to no greater than 1.5x1.5 cm to allow pt to feel comfortable to complete self care for wound.     Time  4    Period  Weeks    Status  On-going      PT LONG TERM GOAL #2   Title  Pt to be able to vocalize the benefits of wearing compression garments for varicosities.     Time  4    Period  Weeks    Status  On-going        Plan - 10/06/18 1202    Clinical Impression Statement  see above    Rehab Potential  Good    PT Frequency  3x / week    PT Duration  3 weeks   3 more   PT Treatment/Interventions  ADLs/Self Care Home Management;Patient/family education;Other (comment)   debridement and dressing change    PT Next Visit Plan  see above    Consulted and Agree with Plan of Care  Patient       Patient will benefit from skilled therapeutic intervention in order to improve the following deficits and impairments:  Decreased skin integrity, Pain  Visit Diagnosis: Pain in right lower leg  Open leg wound, right, sequela     Problem List Patient Active Problem List   Diagnosis Date Noted  . Rash and nonspecific skin eruption 04/06/2015  . Acute blood loss anemia 03/22/2015  . Edema 03/21/2015  . Acute renal failure syndrome ([place])   . Closed right hip fracture ([place]) 03/14/2015  . Rheumatoid arthritis ([place]) 03/14/2015  . Fall 03/14/2015    Kipp Brood, PT, DPT, [facility] Physical Therapist with [facility]  10/06/2018 12:03 PM    [place], [name], [address] Phone: [phone]   Fax:  [phone]  Name: Michaela Simpson MRN: [id number] Date of Birth: Mar 08, 1932

## 2018-10-09 ENCOUNTER — Other Ambulatory Visit: Payer: Self-pay

## 2018-10-09 ENCOUNTER — Ambulatory Visit (HOSPITAL_COMMUNITY): Payer: Medicare Other | Admitting: Physical Therapy

## 2018-10-09 DIAGNOSIS — M79661 Pain in right lower leg: Secondary | ICD-10-CM | POA: Diagnosis not present

## 2018-10-09 DIAGNOSIS — S81801S Unspecified open wound, right lower leg, sequela: Secondary | ICD-10-CM | POA: Diagnosis not present

## 2018-10-09 NOTE — Therapy (Signed)
[place], [name], [address] Phone: [phone]   Fax:  [phone]  Wound Care Therapy  Patient Details  Name: Michaela Simpson MRN: [id number] Date of Birth: [birth year] Referring Provider (PT): Dr. Allyn Kenner   Encounter Date: 10/09/2018  PT End of Session - 10/09/18 1116    Visit Number  20   progress note completed visit #16   Number of Visits  32    Date for PT Re-Evaluation  10/16/18    Authorization Type  Medicare Part A & B    Authorization Time Period  08/03/18- 09/02/18; 09/04/2018 - 10/02/2018; new 09/25/18-10/16/18    Authorization - Visit Number  4    Authorization - Number of Visits  10    PT Start Time  1030    PT Stop Time  1055    PT Time Calculation (min)  25 min    Activity Tolerance  Patient tolerated treatment well    Behavior During Therapy  [facility] for tasks assessed/performed       Past Medical History:  Diagnosis Date  . Osteoporosis   . Rheumatoid arthritis [facility])     Past Surgical History:  Procedure Laterality Date  . INTRAMEDULLARY (IM) NAIL INTERTROCHANTERIC Right 03/14/2015   Procedure: RIGHT INTERTROCHANTRIC INTRAMEDULLARY (IM) NAIL ;  Surgeon: Mcarthur Rossetti, MD;  Location: [facility];  Service: Orthopedics;  Laterality: Right;    There were no vitals filed for this visit.              Wound Therapy - 10/09/18 1112    Subjective  Pt without any issues.     Patient and Family Stated Goals  wound to heal     Date of Onset  05/05/18    Prior Treatments  self care, antibiotics     Pain Scale  0-10    Pain Score  0-No pain    Evaluation and Treatment Procedures Explained to Patient/Family  Yes    Evaluation and Treatment Procedures  agreed to    Wound Properties Date First Assessed: 08/03/18 Time First Assessed: 0949 Wound Type: Puncture Location: Leg Location Orientation: Right Present on Admission: Yes   Dressing Type  Alginate;Gauze (Comment);Silver  hydrofiber;Impregnated gauze (bismuth)   xeroform, alginate, petroleum around margins   Dressing Changed  Changed    Dressing Status  Old drainage    Dressing Change Frequency  PRN    Site / Wound Assessment  Granulation tissue    % Wound base Red or Granulating  100%   100% bright pink   % Wound base Black/Eschar  0%    Peri-wound Assessment  Intact   slight maceration along lateral boarder   Margins  Attached edges (approximated)    Drainage Amount  Minimal    Drainage Description  Serosanguineous    Treatment  Cleansed;Debridement (Selective)    Selective Debridement - Location  wound bed, perimeter    Selective Debridement - Tools Used  Forceps    Selective Debridement - Tissue Removed  slough and devitalized tissues     Wound Therapy - Clinical Statement  wound continues to approximate with more "island" growth within woundbed.  Redness persist around immediate border, however no signs/symptoms of infection.  No hypergranulation present this session.  Contnued with debridement of dry skin and slough and bandage with xerform, calcium alginate, 2X2 and medipore applied with stretch for compression.      Wound Therapy - Frequency  3X / week  4 additional weeks   Wound Therapy - Current Recommendations  PT    Wound Plan  continue with wound care and appropriate dressings. Measure wednesdays.    Dressing   xeroform, alginate, 2x2, medipore tape                PT Short Term Goals - 09/04/18 1212      PT SHORT TERM GOAL #1   Title  PT wound to be 100% granulated to decrease cellulitis    Time  2    Period  Weeks    Status  Partially Met    Target Date  08/17/18      PT SHORT TERM GOAL #2   Title  Pt pain to be no greater than a 4/10 to allow pt to be able to tolerate standing/walking for up  to 30 minutes without increased pain for improved functional ability.     Time  2    Period  Weeks    Status  Achieved        PT Long Term Goals - 09/04/18 1212      PT LONG  TERM GOAL #1   Title  Wound size to be decreased to no greater than 1.5x1.5 cm to allow pt to feel comfortable to complete self care for wound.     Time  4    Period  Weeks    Status  On-going      PT LONG TERM GOAL #2   Title  Pt to be able to vocalize the benefits of wearing compression garments for varicosities.     Time  4    Period  Weeks    Status  On-going              Patient will benefit from skilled therapeutic intervention in order to improve the following deficits and impairments:     Visit Diagnosis: Open leg wound, right, sequela  Pain in right lower leg     Problem List Patient Active Problem List   Diagnosis Date Noted  . Rash and nonspecific skin eruption 04/06/2015  . Acute blood loss anemia 03/22/2015  . Edema 03/21/2015  . Acute renal failure syndrome ([place])   . Closed right hip fracture ([place]) 03/14/2015  . Rheumatoid arthritis ([place]) 03/14/2015  . Fall 03/14/2015   Teena Irani, PTA/CLT [phone]  Teena Irani 10/09/2018, 11:17 AM  [place], [name], [address] Phone: [phone]   Fax:  [phone]  Name: Michaela Simpson MRN: [id number] Date of Birth: [birth year]

## 2018-10-11 ENCOUNTER — Encounter (HOSPITAL_COMMUNITY): Payer: Self-pay

## 2018-10-11 ENCOUNTER — Other Ambulatory Visit: Payer: Self-pay

## 2018-10-11 ENCOUNTER — Ambulatory Visit (HOSPITAL_COMMUNITY): Payer: Medicare Other

## 2018-10-11 DIAGNOSIS — M79661 Pain in right lower leg: Secondary | ICD-10-CM

## 2018-10-11 DIAGNOSIS — S81801S Unspecified open wound, right lower leg, sequela: Secondary | ICD-10-CM

## 2018-10-11 NOTE — Therapy (Signed)
[place], [name], [address] Phone: [phone]   Fax:  [phone]  Wound Care Therapy  Patient Details  Name: Michaela Simpson MRN: [id number] Date of Birth: 1931-06-27 Referring Provider (PT): Dr. Allyn Kenner   Encounter Date: 10/11/2018  PT End of Session - 10/11/18 1108    Visit Number  21   progress note completed visit #16   Number of Visits  32    Date for PT Re-Evaluation  10/16/18    Authorization Type  Medicare Part A & B    Authorization Time Period  08/03/18- 09/02/18; 09/04/2018 - 10/02/2018; new 09/25/18-10/16/18    Authorization - Visit Number  5    Authorization - Number of Visits  10    PT Start Time  1030    PT Stop Time  1051    PT Time Calculation (min)  21 min    Activity Tolerance  Patient tolerated treatment well    Behavior During Therapy  [facility] for tasks assessed/performed       Past Medical History:  Diagnosis Date  . Osteoporosis   . Rheumatoid arthritis [facility])     Past Surgical History:  Procedure Laterality Date  . INTRAMEDULLARY (IM) NAIL INTERTROCHANTERIC Right 03/14/2015   Procedure: RIGHT INTERTROCHANTRIC INTRAMEDULLARY (IM) NAIL ;  Surgeon: Mcarthur Rossetti, MD;  Location: [place];  Service: Orthopedics;  Laterality: Right;    There were no vitals filed for this visit.      Wound Therapy - 10/11/18 1057    Subjective  Patient arrives reporting she is well and denies pain in leg at start of session.     Patient and Family Stated Goals  wound to heal     Date of Onset  05/05/18    Prior Treatments  self care, antibiotics     Pain Scale  0-10    Pain Score  0-No pain    Evaluation and Treatment Procedures Explained to Patient/Family  Yes    Evaluation and Treatment Procedures  agreed to    Wound Properties Date First Assessed: 08/03/18 Time First Assessed: 0949 Wound Type: Puncture Location: Leg Location Orientation: Right Present on Admission: Yes   Dressing Type   Alginate;Gauze (Comment);Silver hydrofiber;Impregnated gauze (bismuth)   xeroform, alginate, petroleum around margins   Dressing Changed  Changed    Dressing Status  Old drainage    Dressing Change Frequency  PRN    Site / Wound Assessment  Granulation tissue    % Wound base Red or Granulating  100%   100% bright pink   % Wound base Black/Eschar  0%    Peri-wound Assessment  Intact   slight maceration along lateral boarder   Margins  Attached edges (approximated)    Drainage Amount  Minimal    Drainage Description  Serosanguineous    Treatment  Cleansed;Debridement (Selective)    Selective Debridement - Location  wound bed, perimeter    Selective Debridement - Tools Used  Forceps    Selective Debridement - Tissue Removed  slough and devitalized tissues     Wound Therapy - Clinical Statement  Patient's wound has continued to gradually reduce in size. It has improved approximation distally. There continues to be and "island" of growth within the wound  bed of new skin. Periwound is intact and continues to present with redness immediately around boarder but no sign of infection present. Continued with cleansing and selective debridement of slough from wound bed. Applied xeroform,  alginate, and 2x2 with mediproe tape over top to stretch for compression. Patient will continue to benefit from skilled wound care to promote healing.     Wound Therapy - Frequency  3X / week   4 additional weeks   Wound Therapy - Current Recommendations  PT    Wound Plan  continue with wound care and appropriate dressings. Measure wednesdays.    Dressing   xeroform, alginate, 2x2, medipore tape         PT Short Term Goals - 09/04/18 1212      PT SHORT TERM GOAL #1   Title  PT wound to be 100% granulated to decrease cellulitis    Time  2    Period  Weeks    Status  Partially Met    Target Date  08/17/18      PT SHORT TERM GOAL #2   Title  Pt pain to be no greater than a 4/10 to allow pt to be able to  tolerate standing/walking for up  to 30 minutes without increased pain for improved functional ability.     Time  2    Period  Weeks    Status  Achieved        PT Long Term Goals - 09/04/18 1212      PT LONG TERM GOAL #1   Title  Wound size to be decreased to no greater than 1.5x1.5 cm to allow pt to feel comfortable to complete self care for wound.     Time  4    Period  Weeks    Status  On-going      PT LONG TERM GOAL #2   Title  Pt to be able to vocalize the benefits of wearing compression garments for varicosities.     Time  4    Period  Weeks    Status  On-going         Plan - 10/11/18 1108    Clinical Impression Statement  see above    Rehab Potential  Good    PT Frequency  3x / week    PT Duration  3 weeks   3 more   PT Treatment/Interventions  ADLs/Self Care Home Management;Patient/family education;Other (comment)   debridement and dressing change    PT Next Visit Plan  see above    Consulted and Agree with Plan of Care  Patient       Patient will benefit from skilled therapeutic intervention in order to improve the following deficits and impairments:  Decreased skin integrity, Pain  Visit Diagnosis: Open leg wound, right, sequela  Pain in right lower leg     Problem List Patient Active Problem List   Diagnosis Date Noted  . Rash and nonspecific skin eruption 04/06/2015  . Acute blood loss anemia 03/22/2015  . Edema 03/21/2015  . Acute renal failure syndrome ([place])   . Closed right hip fracture ([place]) 03/14/2015  . Rheumatoid arthritis ([place]) 03/14/2015  . Fall 03/14/2015   Kipp Brood, PT, DPT, [facility] Physical Therapist with [facility]  10/11/2018 11:08 AM    [place], [name], [address] Phone: [phone]   Fax:  [phone]  Name: Michaela Simpson MRN: [id number] Date of Birth: July 18, 1931

## 2018-10-13 ENCOUNTER — Encounter (HOSPITAL_COMMUNITY): Payer: Self-pay

## 2018-10-13 ENCOUNTER — Ambulatory Visit (HOSPITAL_COMMUNITY): Payer: Medicare Other

## 2018-10-13 ENCOUNTER — Other Ambulatory Visit: Payer: Self-pay

## 2018-10-13 DIAGNOSIS — F331 Major depressive disorder, recurrent, moderate: Secondary | ICD-10-CM | POA: Diagnosis not present

## 2018-10-13 DIAGNOSIS — N183 Chronic kidney disease, stage 3 (moderate): Secondary | ICD-10-CM | POA: Diagnosis not present

## 2018-10-13 DIAGNOSIS — S81801S Unspecified open wound, right lower leg, sequela: Secondary | ICD-10-CM | POA: Diagnosis not present

## 2018-10-13 DIAGNOSIS — L97911 Non-pressure chronic ulcer of unspecified part of right lower leg limited to breakdown of skin: Secondary | ICD-10-CM | POA: Diagnosis not present

## 2018-10-13 DIAGNOSIS — I10 Essential (primary) hypertension: Secondary | ICD-10-CM | POA: Diagnosis not present

## 2018-10-13 DIAGNOSIS — E782 Mixed hyperlipidemia: Secondary | ICD-10-CM | POA: Diagnosis not present

## 2018-10-13 DIAGNOSIS — M79661 Pain in right lower leg: Secondary | ICD-10-CM

## 2018-10-13 DIAGNOSIS — M79641 Pain in right hand: Secondary | ICD-10-CM | POA: Diagnosis not present

## 2018-10-13 DIAGNOSIS — M79642 Pain in left hand: Secondary | ICD-10-CM | POA: Diagnosis not present

## 2018-10-13 NOTE — Therapy (Signed)
[place], [name], [address] Phone: [phone]   Fax:  [phone]  Wound Care Therapy  Patient Details  Name: Michaela Simpson MRN: [id number] Date of Birth: [birth year] Referring Provider (PT): Dr. Allyn Kenner   Encounter Date: 10/13/2018  PT End of Session - 10/13/18 1023    Visit Number  22   progress note completed visit #16   Number of Visits  32    Date for PT Re-Evaluation  10/16/18    Authorization Type  Medicare Part A & B    Authorization Time Period  08/03/18- 09/02/18; 09/04/2018 - 10/02/2018; new 09/25/18-10/16/18    Authorization - Visit Number  6    Authorization - Number of Visits  10    PT Start Time  0945    PT Stop Time  1006    PT Time Calculation (min)  21 min    Activity Tolerance  Patient tolerated treatment well    Behavior During Therapy  [facility] for tasks assessed/performed       Past Medical History:  Diagnosis Date  . Osteoporosis   . Rheumatoid arthritis [facility])     Past Surgical History:  Procedure Laterality Date  . INTRAMEDULLARY (IM) NAIL INTERTROCHANTERIC Right 03/14/2015   Procedure: RIGHT INTERTROCHANTRIC INTRAMEDULLARY (IM) NAIL ;  Surgeon: Mcarthur Rossetti, MD;  Location: [place];  Service: Orthopedics;  Laterality: Right;    There were no vitals filed for this visit.   Wound Therapy - 10/13/18 1017    Subjective  Patient arrives with dressing intact and denies pain at start of session. She states she sponge bathed to keep the dressing from getting wet yesterday.    Patient and Family Stated Goals  wound to heal     Date of Onset  05/05/18    Prior Treatments  self care, antibiotics     Pain Scale  0-10    Pain Score  0-No pain    Evaluation and Treatment Procedures Explained to Patient/Family  Yes    Evaluation and Treatment Procedures  agreed to    Wound Properties Date First Assessed: 08/03/18 Time First Assessed: 0949 Wound Type: Puncture Location: Leg Location  Orientation: Right Present on Admission: Yes   Dressing Type  Alginate;Gauze (Comment);Silver hydrofiber;Impregnated gauze (bismuth)   xeroform, alginate, petroleum around margins   Dressing Changed  Changed    Dressing Status  Old drainage    Dressing Change Frequency  PRN    Site / Wound Assessment  Granulation tissue    % Wound base Red or Granulating  100%   100% bright pink   % Wound base Black/Eschar  0%    Peri-wound Assessment  Intact   slight maceration along lateral boarder   Margins  Attached edges (approximated)    Drainage Amount  Minimal    Drainage Description  Serosanguineous    Treatment  Cleansed;Debridement (Selective)    Selective Debridement - Location  wound bed, perimeter    Selective Debridement - Tools Used  Forceps    Selective Debridement - Tissue Removed  slough and devitalized tissues     Wound Therapy - Clinical Statement  Patient's wound appears to have further improvement in approximation distally and superiorly. There is reduced redness around the periwound and reduced slough in wound bed. The periwound appears to have reduced elevation as it previously had been raised immediately around wound margins. Conitnued with cleansing and selective debridement to remove slough from wound bed. Xeroform,  alginate, and 2x2 applied follow by medipore tape stretched to apply slight pressure. She will continue to benefit from skilled wound care to promote healing.    Wound Therapy - Frequency  3X / week   4 additional weeks   Wound Therapy - Current Recommendations  PT    Wound Plan  continue with wound care and appropriate dressings. Measure wednesdays.    Dressing   xeroform, alginate, 2x2, medipore tape       PT Short Term Goals - 09/04/18 1212      PT SHORT TERM GOAL #1   Title  PT wound to be 100% granulated to decrease cellulitis    Time  2    Period  Weeks    Status  Partially Met    Target Date  08/17/18      PT SHORT TERM GOAL #2   Title  Pt pain to  be no greater than a 4/10 to allow pt to be able to tolerate standing/walking for up  to 30 minutes without increased pain for improved functional ability.     Time  2    Period  Weeks    Status  Achieved        PT Long Term Goals - 09/04/18 1212      PT LONG TERM GOAL #1   Title  Wound size to be decreased to no greater than 1.5x1.5 cm to allow pt to feel comfortable to complete self care for wound.     Time  4    Period  Weeks    Status  On-going      PT LONG TERM GOAL #2   Title  Pt to be able to vocalize the benefits of wearing compression garments for varicosities.     Time  4    Period  Weeks    Status  On-going        Plan - 10/13/18 1024    Clinical Impression Statement  see above    Rehab Potential  Good    PT Frequency  3x / week    PT Duration  3 weeks   3 more   PT Treatment/Interventions  ADLs/Self Care Home Management;Patient/family education;Other (comment)   debridement and dressing change    PT Next Visit Plan  see above    Consulted and Agree with Plan of Care  Patient       Patient will benefit from skilled therapeutic intervention in order to improve the following deficits and impairments:  Decreased skin integrity, Pain  Visit Diagnosis: Open leg wound, right, sequela  Pain in right lower leg     Problem List Patient Active Problem List   Diagnosis Date Noted  . Rash and nonspecific skin eruption 04/06/2015  . Acute blood loss anemia 03/22/2015  . Edema 03/21/2015  . Acute renal failure syndrome ([place])   . Closed right hip fracture ([place]) 03/14/2015  . Rheumatoid arthritis ([place]) 03/14/2015  . Fall 03/14/2015    Kipp Brood, PT, DPT, [facility] Physical Therapist with [facility]  10/13/2018 10:24 AM    [place] [address] [place], [name], [address] Phone: [phone]   Fax:  [phone]  Name: ELHAM FINI MRN: [id number] Date of Birth:  August 12, 1931

## 2018-10-16 ENCOUNTER — Other Ambulatory Visit: Payer: Self-pay

## 2018-10-16 ENCOUNTER — Encounter (HOSPITAL_COMMUNITY): Payer: Self-pay

## 2018-10-16 ENCOUNTER — Ambulatory Visit (HOSPITAL_COMMUNITY): Payer: Medicare Other

## 2018-10-16 DIAGNOSIS — S81801S Unspecified open wound, right lower leg, sequela: Secondary | ICD-10-CM | POA: Diagnosis not present

## 2018-10-16 DIAGNOSIS — M79661 Pain in right lower leg: Secondary | ICD-10-CM | POA: Diagnosis not present

## 2018-10-16 NOTE — Therapy (Signed)
[place], [name], [address] Phone: [phone]   Fax:  [phone]  Wound Care Therapy  Patient Details  Name: Michaela Simpson MRN: [id number] Date of Birth: 17-May-1932 Referring Provider (PT): Dr. Allyn Kenner   Encounter Date: 10/16/2018  PT End of Session - 10/16/18 1021    Visit Number  23   progress note completed visit #16   Number of Visits  38    Date for PT Re-Evaluation  10/16/18    Authorization Type  Medicare Part A & B    Authorization Time Period  08/03/18- 09/02/18; 09/04/2018 - 10/02/2018; new 09/25/18-10/16/18; 10/16/18-10/30/18    Authorization - Visit Number  1    Authorization - Number of Visits  10    PT Start Time  0951    PT Stop Time  1014    PT Time Calculation (min)  23 min    Activity Tolerance  Patient tolerated treatment well    Behavior During Therapy  [facility] for tasks assessed/performed       Past Medical History:  Diagnosis Date  . Osteoporosis   . Rheumatoid arthritis [facility])     Past Surgical History:  Procedure Laterality Date  . INTRAMEDULLARY (IM) NAIL INTERTROCHANTERIC Right 03/14/2015   Procedure: RIGHT INTERTROCHANTRIC INTRAMEDULLARY (IM) NAIL ;  Surgeon: Mcarthur Rossetti, MD;  Location: [place];  Service: Orthopedics;  Laterality: Right;    There were no vitals filed for this visit.    Wound Therapy - 10/16/18 1016    Subjective  Patient denies pain at start of session and reports she had a good weekend.    Patient and Family Stated Goals  wound to heal     Date of Onset  05/05/18    Prior Treatments  self care, antibiotics     Pain Scale  0-10    Pain Score  0-No pain    Evaluation and Treatment Procedures Explained to Patient/Family  Yes    Evaluation and Treatment Procedures  agreed to    Wound Properties Date First Assessed: 08/03/18 Time First Assessed: 0949 Wound Type: Puncture Location: Leg Location Orientation: Right Present on Admission: Yes   Dressing  Type  Alginate;Gauze (Comment);Silver hydrofiber;Impregnated gauze (bismuth)   xeroform, alginate, petroleum around margins   Dressing Changed  Changed    Dressing Status  Old drainage    Dressing Change Frequency  PRN    Site / Wound Assessment  Granulation tissue    % Wound base Red or Granulating  100%   100% bright pink   % Wound base Black/Eschar  0%    Peri-wound Assessment  Intact   slight maceration along lateral boarder   Wound Length (cm)  1.7 cm   2.0   Wound Width (cm)  0.6 cm   0.6   Wound Depth (cm)  0.1 cm   0.1   Wound Volume (cm^3)  0.1 cm^3    Wound Surface Area (cm^2)  1.02 cm^2    Margins  Attached edges (approximated)    Drainage Amount  Minimal    Drainage Description  Serosanguineous    Treatment  Cleansed;Debridement (Selective)    Selective Debridement - Location  wound bed, perimeter    Selective Debridement - Tools Used  Forceps    Selective Debridement - Tissue Removed  slough and devitalized tissues     Wound Therapy - Clinical Statement  Patient's wound continues to gradually reduce in size. No redness or elevation  of periwound this session indicating no local infection. Continued with selective debridement to remove slough from wound bed and had greater success removing slough from superior and medial wound bed above and below island of healing skin. Continued with xeroform and 2x2 followed by medipore tape. She will continue to benefit from skilled wound care to promote healing.    Wound Therapy - Frequency  3X / week   4 additional weeks   Wound Therapy - Current Recommendations  PT    Wound Plan  continue with wound care and appropriate dressings. Measure wednesdays.    Dressing   xeroform, alginate, 2x2, medipore tape        PT Short Term Goals - 09/04/18 1212      PT SHORT TERM GOAL #1   Title  PT wound to be 100% granulated to decrease cellulitis    Time  2    Period  Weeks    Status  Partially Met    Target Date  08/17/18      PT  SHORT TERM GOAL #2   Title  Pt pain to be no greater than a 4/10 to allow pt to be able to tolerate standing/walking for up  to 30 minutes without increased pain for improved functional ability.     Time  2    Period  Weeks    Status  Achieved        PT Long Term Goals - 09/04/18 1212      PT LONG TERM GOAL #1   Title  Wound size to be decreased to no greater than 1.5x1.5 cm to allow pt to feel comfortable to complete self care for wound.     Time  4    Period  Weeks    Status  On-going      PT LONG TERM GOAL #2   Title  Pt to be able to vocalize the benefits of wearing compression garments for varicosities.     Time  4    Period  Weeks    Status  On-going        Plan - 10/16/18 1023    Clinical Impression Statement  see above    Rehab Potential  Good    PT Frequency  3x / week    PT Duration  3 weeks   3 more   PT Treatment/Interventions  ADLs/Self Care Home Management;Patient/family education;Other (comment)   debridement and dressing change    PT Next Visit Plan  see above    Consulted and Agree with Plan of Care  Patient       Patient will benefit from skilled therapeutic intervention in order to improve the following deficits and impairments:  Decreased skin integrity, Pain  Visit Diagnosis: Open leg wound, right, sequela  Pain in right lower leg     Problem List Patient Active Problem List   Diagnosis Date Noted  . Rash and nonspecific skin eruption 04/06/2015  . Acute blood loss anemia 03/22/2015  . Edema 03/21/2015  . Acute renal failure syndrome ([place])   . Closed right hip fracture ([place]) 03/14/2015  . Rheumatoid arthritis ([place]) 03/14/2015  . Fall 03/14/2015    Kipp Brood, PT, DPT, [facility] Physical Therapist with [facility]  10/16/2018 10:25 AM    [place], [name], [address] Phone: [phone]   Fax:  [phone]  Name: Michaela Simpson MRN: [id number] Date of Birth: 10-20-1931

## 2018-10-18 ENCOUNTER — Ambulatory Visit (HOSPITAL_COMMUNITY): Payer: Medicare Other

## 2018-10-18 ENCOUNTER — Other Ambulatory Visit: Payer: Self-pay

## 2018-10-18 ENCOUNTER — Encounter (HOSPITAL_COMMUNITY): Payer: Self-pay

## 2018-10-18 DIAGNOSIS — S81801S Unspecified open wound, right lower leg, sequela: Secondary | ICD-10-CM

## 2018-10-18 DIAGNOSIS — M79661 Pain in right lower leg: Secondary | ICD-10-CM | POA: Diagnosis not present

## 2018-10-18 NOTE — Therapy (Signed)
[place], [name], [address] Phone: [phone]   Fax:  [phone]  Wound Care Therapy  Patient Details  Name: Michaela Simpson MRN: [id number] Date of Birth: May 28, 1932 Referring Provider (PT): Dr. Allyn Kenner   Encounter Date: 10/18/2018  PT End of Session - 10/18/18 1155    Visit Number  24   progress note completed visit #16   Number of Visits  38    Date for PT Re-Evaluation  10/16/18    Authorization Type  Medicare Part A & B    Authorization Time Period  08/03/18- 09/02/18; 09/04/2018 - 10/02/2018; new 09/25/18-10/16/18; 10/16/18-10/30/18    Authorization - Visit Number  2    Authorization - Number of Visits  10    PT Start Time  1040    PT Stop Time  1110    PT Time Calculation (min)  30 min    Activity Tolerance  Patient tolerated treatment well    Behavior During Therapy  [facility] for tasks assessed/performed       Past Medical History:  Diagnosis Date  . Osteoporosis   . Rheumatoid arthritis [facility])     Past Surgical History:  Procedure Laterality Date  . INTRAMEDULLARY (IM) NAIL INTERTROCHANTERIC Right 03/14/2015   Procedure: RIGHT INTERTROCHANTRIC INTRAMEDULLARY (IM) NAIL ;  Surgeon: Mcarthur Rossetti, MD;  Location: [place];  Service: Orthopedics;  Laterality: Right;    There were no vitals filed for this visit.    Wound Therapy - 10/18/18 1114    Subjective  Patient arrives with dressing intact. She asks about using waterproof band aids overtop of her dressing to keep it dry in the shower.     Patient and Family Stated Goals  wound to heal     Date of Onset  05/05/18    Prior Treatments  self care, antibiotics     Pain Scale  0-10    Pain Score  0-No pain    Evaluation and Treatment Procedures Explained to Patient/Family  Yes    Evaluation and Treatment Procedures  agreed to    Wound Properties Date First Assessed: 08/03/18 Time First Assessed: 0949 Wound Type: Puncture Location: Leg  Location Orientation: Right Present on Admission: Yes   Dressing Type  Gauze (Comment);Silver hydrofiber;Impregnated gauze (bismuth)   xeroform, alginate, petroleum around margins   Dressing Changed  Changed    Dressing Status  Old drainage    Dressing Change Frequency  PRN    Site / Wound Assessment  Granulation tissue    % Wound base Red or Granulating  100%   100% bright pink   % Wound base Black/Eschar  0%    Peri-wound Assessment  Intact   slight maceration along lateral boarder   Margins  Attached edges (approximated)    Drainage Amount  Minimal    Drainage Description  Serosanguineous    Treatment  Cleansed;Debridement (Selective)    Selective Debridement - Location  wound bed, perimeter    Selective Debridement - Tools Used  Forceps    Selective Debridement - Tissue Removed  slough and devitalized tissues     Wound Therapy - Clinical Statement  Patient's wound is gradually healing and wound had reduced slough and brighter/redder wound bed present this session. Continued with cleansing and debridement to remove dried slough. Island in middle of wound appears to be approximating from medial wound margin to lateral and anticipate it will heal over soon. Continued with xeroform to maintain moist  wound environment and 2x2 gauze and medipore tape. She will continue to benefit from skilled wound care to promote healing.    Wound Therapy - Frequency  3X / week   4 additional weeks   Wound Therapy - Current Recommendations  PT    Wound Plan  continue with wound care and appropriate dressings. Measure wednesdays.    Dressing   xeroform, 2x2, medipore tape      Educated pt she could use waterproof band aids overtop of her wound dressing to keep her dressing dry while taking a shower and that we would remove it for her on Friday at her next appointment.   PT Short Term Goals - 09/04/18 1212      PT SHORT TERM GOAL #1   Title  PT wound to be 100% granulated to decrease cellulitis    Time   2    Period  Weeks    Status  Partially Met    Target Date  08/17/18      PT SHORT TERM GOAL #2   Title  Pt pain to be no greater than a 4/10 to allow pt to be able to tolerate standing/walking for up  to 30 minutes without increased pain for improved functional ability.     Time  2    Period  Weeks    Status  Achieved        PT Long Term Goals - 09/04/18 1212      PT LONG TERM GOAL #1   Title  Wound size to be decreased to no greater than 1.5x1.5 cm to allow pt to feel comfortable to complete self care for wound.     Time  4    Period  Weeks    Status  On-going      PT LONG TERM GOAL #2   Title  Pt to be able to vocalize the benefits of wearing compression garments for varicosities.     Time  4    Period  Weeks    Status  On-going       Plan - 10/18/18 1155    Clinical Impression Statement  see above    Rehab Potential  Good    PT Frequency  3x / week    PT Duration  3 weeks   3 more   PT Treatment/Interventions  ADLs/Self Care Home Management;Patient/family education;Other (comment)   debridement and dressing change    PT Next Visit Plan  see above    Consulted and Agree with Plan of Care  Patient       Patient will benefit from skilled therapeutic intervention in order to improve the following deficits and impairments:  Decreased skin integrity, Pain  Visit Diagnosis: Open leg wound, right, sequela  Pain in right lower leg     Problem List Patient Active Problem List   Diagnosis Date Noted  . Rash and nonspecific skin eruption 04/06/2015  . Acute blood loss anemia 03/22/2015  . Edema 03/21/2015  . Acute renal failure syndrome ([place])   . Closed right hip fracture ([place]) 03/14/2015  . Rheumatoid arthritis ([place]) 03/14/2015  . Fall 03/14/2015    Kipp Brood, PT, DPT, [facility] Physical Therapist with [facility]  10/18/2018 11:56 AM    [place] [address] [place], [name],  [address] Phone: [phone]   Fax:  [phone]  Name: Michaela Simpson MRN: [id number] Date of Birth: 1931/09/04

## 2018-10-20 ENCOUNTER — Other Ambulatory Visit: Payer: Self-pay

## 2018-10-20 ENCOUNTER — Ambulatory Visit (HOSPITAL_COMMUNITY): Payer: Medicare Other | Attending: Internal Medicine | Admitting: Physical Therapy

## 2018-10-20 DIAGNOSIS — S81801S Unspecified open wound, right lower leg, sequela: Secondary | ICD-10-CM

## 2018-10-20 DIAGNOSIS — M79661 Pain in right lower leg: Secondary | ICD-10-CM | POA: Insufficient documentation

## 2018-10-20 NOTE — Therapy (Signed)
[place], [name], [address] Phone: [phone]   Fax:  [phone]  Wound Care Therapy  Patient Details  Name: Michaela Simpson MRN: [id number] Date of Birth: May 09, 1932 Referring Provider (PT): Dr. Allyn Kenner   Encounter Date: 10/20/2018  PT End of Session - 10/20/18 1408    Visit Number  25   progress note completed visit #16   Number of Visits  38    Date for PT Re-Evaluation  10/16/18    Authorization Type  Medicare Part A & B    Authorization Time Period  08/03/18- 09/02/18; 09/04/2018 - 10/02/2018; new 09/25/18-10/16/18; 10/16/18-10/30/18    Authorization - Number of Visits  10    PT Start Time  1035    PT Stop Time  1100    PT Time Calculation (min)  25 min    Activity Tolerance  Patient tolerated treatment well    Behavior During Therapy  [facility] for tasks assessed/performed       Past Medical History:  Diagnosis Date  . Osteoporosis   . Rheumatoid arthritis [facility])     Past Surgical History:  Procedure Laterality Date  . INTRAMEDULLARY (IM) NAIL INTERTROCHANTERIC Right 03/14/2015   Procedure: RIGHT INTERTROCHANTRIC INTRAMEDULLARY (IM) NAIL ;  Surgeon: Mcarthur Rossetti, MD;  Location: [place];  Service: Orthopedics;  Laterality: Right;    There were no vitals filed for this visit.              Wound Therapy - 10/20/18 1359    Subjective  pt without any issues today.  Brought her tegaderm and was wondering how to use it.    Patient and Family Stated Goals  wound to heal     Date of Onset  05/05/18    Prior Treatments  self care, antibiotics     Pain Scale  0-10    Pain Score  0-No pain    Evaluation and Treatment Procedures Explained to Patient/Family  Yes    Evaluation and Treatment Procedures  agreed to    Wound Properties Date First Assessed: 08/03/18 Time First Assessed: 0949 Wound Type: Puncture Location: Leg Location Orientation: Right Present on Admission: Yes   Dressing Type   Gauze (Comment);Silver hydrofiber;Impregnated gauze (bismuth)   xeroform, alginate, petroleum around margins   Dressing Changed  Changed    Dressing Status  Old drainage    Dressing Change Frequency  PRN    Site / Wound Assessment  Granulation tissue    % Wound base Red or Granulating  100%   100% bright pink   % Wound base Black/Eschar  0%    Peri-wound Assessment  Intact   slight maceration along lateral boarder   Margins  Attached edges (approximated)    Drainage Amount  Minimal    Drainage Description  Serosanguineous    Treatment  Cleansed;Debridement (Selective)    Selective Debridement - Location  wound bed, perimeter    Selective Debridement - Tools Used  Forceps    Selective Debridement - Tissue Removed  slough and devitalized tissues     Wound Therapy - Clinical Statement  wound continues to make slow progress.  Discussed with evaluating PT and decided to change dressing to medihoney colloid to hopefully kick start healing process.  contineud with gauze and medipore tape.  Pt with questions regarding tegaderm and verbalized instrictions to use it.  Pt wants to wait until Monday to try it.   Debrided dry skin perimeter of  wound and moisturized.      Wound Therapy - Frequency  3X / week   4 additional weeks   Wound Therapy - Current Recommendations  PT    Wound Plan  continue with wound care and appropriate dressings. Measure wednesdays.  10th visit PN next visit.    Dressing   medihoney colloid cut to wound, 2x2, medipore tape                PT Short Term Goals - 09/04/18 1212      PT SHORT TERM GOAL #1   Title  PT wound to be 100% granulated to decrease cellulitis    Time  2    Period  Weeks    Status  Partially Met    Target Date  08/17/18      PT SHORT TERM GOAL #2   Title  Pt pain to be no greater than a 4/10 to allow pt to be able to tolerate standing/walking for up  to 30 minutes without increased pain for improved functional ability.     Time  2     Period  Weeks    Status  Achieved        PT Long Term Goals - 09/04/18 1212      PT LONG TERM GOAL #1   Title  Wound size to be decreased to no greater than 1.5x1.5 cm to allow pt to feel comfortable to complete self care for wound.     Time  4    Period  Weeks    Status  On-going      PT LONG TERM GOAL #2   Title  Pt to be able to vocalize the benefits of wearing compression garments for varicosities.     Time  4    Period  Weeks    Status  On-going              Patient will benefit from skilled therapeutic intervention in order to improve the following deficits and impairments:     Visit Diagnosis: Open leg wound, right, sequela  Pain in right lower leg     Problem List Patient Active Problem List   Diagnosis Date Noted  . Rash and nonspecific skin eruption 04/06/2015  . Acute blood loss anemia 03/22/2015  . Edema 03/21/2015  . Acute renal failure syndrome ([place])   . Closed right hip fracture ([place]) 03/14/2015  . Rheumatoid arthritis ([place]) 03/14/2015  . Fall 03/14/2015   Teena Irani, PTA/CLT [phone]  Dynasti Kerman B 10/20/2018, 2:09 PM  [place], [name], [address] Phone: [phone]   Fax:  [phone]  Name: Michaela Simpson MRN: [id number] Date of Birth: [birth year]

## 2018-10-23 ENCOUNTER — Ambulatory Visit (HOSPITAL_COMMUNITY): Payer: Medicare Other | Admitting: Physical Therapy

## 2018-10-23 ENCOUNTER — Ambulatory Visit (HOSPITAL_COMMUNITY): Payer: Medicare Other

## 2018-10-23 ENCOUNTER — Telehealth (HOSPITAL_COMMUNITY): Payer: Self-pay | Admitting: Internal Medicine

## 2018-10-23 ENCOUNTER — Other Ambulatory Visit: Payer: Self-pay

## 2018-10-23 DIAGNOSIS — S81801S Unspecified open wound, right lower leg, sequela: Secondary | ICD-10-CM

## 2018-10-23 DIAGNOSIS — M79661 Pain in right lower leg: Secondary | ICD-10-CM | POA: Diagnosis not present

## 2018-10-23 NOTE — Telephone Encounter (Signed)
10/23/18  pt called and said that she needed to cx she had someone coming this morning to work on her driveway and she needed to be there.... looked for something later today but there was nothing available

## 2018-10-23 NOTE — Therapy (Signed)
[place], [name], [address] Phone: [phone]   Fax:  [phone]  Wound Care Therapy  Patient Details  Name: Michaela Simpson MRN: [id number] Date of Birth: 1931-12-25 Referring Provider (PT): Dr. Allyn Kenner   Encounter Date: 10/23/2018  PT End of Session - 10/23/18 1611    Visit Number  26   progress note completed visit #24   Number of Visits  38    Date for PT Re-Evaluation  10/16/18    Authorization Type  Medicare Part A & B    Authorization Time Period  08/03/18- 09/02/18; 09/04/2018 - 10/02/2018; new 09/25/18-10/16/18; 10/16/18-10/30/18    Authorization - Number of Visits  10    PT Start Time  1520    PT Stop Time  1550    PT Time Calculation (min)  30 min    Activity Tolerance  Patient tolerated treatment well    Behavior During Therapy  [facility] for tasks assessed/performed       Past Medical History:  Diagnosis Date  . Osteoporosis   . Rheumatoid arthritis [facility])     Past Surgical History:  Procedure Laterality Date  . INTRAMEDULLARY (IM) NAIL INTERTROCHANTERIC Right 03/14/2015   Procedure: RIGHT INTERTROCHANTRIC INTRAMEDULLARY (IM) NAIL ;  Surgeon: Mcarthur Rossetti, MD;  Location: [place];  Service: Orthopedics;  Laterality: Right;    There were no vitals filed for this visit.              Wound Therapy - 10/23/18 1626    Subjective  pt unable to make her appt this morning due to conflict.  No issues or pain to reports    Patient and Family Stated Goals  wound to heal     Date of Onset  05/05/18    Prior Treatments  self care, antibiotics     Pain Scale  0-10    Pain Score  0-No pain    Evaluation and Treatment Procedures Explained to Patient/Family  Yes    Evaluation and Treatment Procedures  agreed to    Wound Properties Date First Assessed: 08/03/18 Time First Assessed: 0949 Wound Type: Puncture Location: Leg Location Orientation: Right Present on Admission: Yes   Dressing Type   Gauze (Comment);Silver hydrofiber;Impregnated gauze (bismuth)   xeroform, alginate, petroleum around margins   Dressing Changed  Changed    Dressing Status  Old drainage    Dressing Change Frequency  PRN    Site / Wound Assessment  Granulation tissue    % Wound base Red or Granulating  100%   100% bright pink   % Wound base Black/Eschar  0%    Peri-wound Assessment  Intact;Maceration   slight maceration along lateral boarder   Margins  Attached edges (approximated)    Drainage Amount  Minimal    Drainage Description  Serosanguineous    Treatment  Cleansed;Debridement (Selective)    Selective Debridement - Location  wound bed, perimeter    Selective Debridement - Tools Used  Forceps    Selective Debridement - Tissue Removed  slough and devitalized tissues     Wound Therapy - Clinical Statement  pt with more maceration present after adding medihoney colloid indicating making too moist.  Debrided devitatlized tissue, cleansed well and applied vaseline to borders to protect.  Changed back to xeroform cut inside the wound borders only.     Wound Therapy - Frequency  3X / week   4 additional weeks   Wound Therapy -  Current Recommendations  PT    Wound Plan  continue with wound care and appropriate dressings. Measure wednesdays.  10th visit PN next visit.    Dressing   xeroform, 2x2, medipore tape                PT Short Term Goals - 09/04/18 1212      PT SHORT TERM GOAL #1   Title  PT wound to be 100% granulated to decrease cellulitis    Time  2    Period  Weeks    Status  Partially Met    Target Date  08/17/18      PT SHORT TERM GOAL #2   Title  Pt pain to be no greater than a 4/10 to allow pt to be able to tolerate standing/walking for up  to 30 minutes without increased pain for improved functional ability.     Time  2    Period  Weeks    Status  Achieved        PT Long Term Goals - 09/04/18 1212      PT LONG TERM GOAL #1   Title  Wound size to be decreased to no  greater than 1.5x1.5 cm to allow pt to feel comfortable to complete self care for wound.     Time  4    Period  Weeks    Status  On-going      PT LONG TERM GOAL #2   Title  Pt to be able to vocalize the benefits of wearing compression garments for varicosities.     Time  4    Period  Weeks    Status  On-going              Patient will benefit from skilled therapeutic intervention in order to improve the following deficits and impairments:     Visit Diagnosis: Open leg wound, right, sequela  Pain in right lower leg     Problem List Patient Active Problem List   Diagnosis Date Noted  . Rash and nonspecific skin eruption 04/06/2015  . Acute blood loss anemia 03/22/2015  . Edema 03/21/2015  . Acute renal failure syndrome ([place])   . Closed right hip fracture ([place]) 03/14/2015  . Rheumatoid arthritis ([place]) 03/14/2015  . Fall 03/14/2015   Teena Irani, PTA/CLT [phone]  Frazier, Amy B 10/23/2018, 4:28 PM  [facility] [address] [place], [name], [address] Phone: [phone]   Fax:  [phone]  Name: SHAMEEKA SILLIMAN MRN: [id number] Date of Birth: 1931-12-12

## 2018-10-24 ENCOUNTER — Telehealth (HOSPITAL_COMMUNITY): Payer: Self-pay

## 2018-10-24 NOTE — Telephone Encounter (Signed)
Pt requested later apptment for tomorrow and Apolonio Schneiders agrred to see her at 1:45pm.

## 2018-10-24 NOTE — Telephone Encounter (Signed)
Emailed Amy and Apolonio Schneiders: Could one of you see Michaela Simpson in the late after noon tomorrow. She is have her driveway worked on starting at [employer] and wished to change her appointment time. She is scheduled with Apolonio Schneiders at 9:45am in the morning. Please let me know if you can accommodate her request.

## 2018-10-25 ENCOUNTER — Encounter (HOSPITAL_COMMUNITY): Payer: Self-pay

## 2018-10-25 ENCOUNTER — Other Ambulatory Visit: Payer: Self-pay

## 2018-10-25 ENCOUNTER — Ambulatory Visit (HOSPITAL_COMMUNITY): Payer: Medicare Other

## 2018-10-25 DIAGNOSIS — M79661 Pain in right lower leg: Secondary | ICD-10-CM

## 2018-10-25 DIAGNOSIS — S81801S Unspecified open wound, right lower leg, sequela: Secondary | ICD-10-CM

## 2018-10-25 NOTE — Therapy (Signed)
[place], [name], [address] Phone: [phone]   Fax:  [phone]  Wound Care Therapy  Patient Details  Name: Michaela Simpson MRN: [id number] Date of Birth: 07-31-1931 Referring Provider (PT): Dr. Allyn Kenner   Encounter Date: 10/25/2018  PT End of Session - 10/25/18 1440    Visit Number  27   progress note completed visit #24   Number of Visits  38    Date for PT Re-Evaluation  10/16/18    Authorization Type  Medicare Part A & B    Authorization Time Period  08/03/18- 09/02/18; 09/04/2018 - 10/02/2018; new 09/25/18-10/16/18; 10/16/18-10/30/18    Authorization - Visit Number  5    Authorization - Number of Visits  10    PT Start Time  1409    PT Stop Time  1426    PT Time Calculation (min)  17 min    Activity Tolerance  Patient tolerated treatment well    Behavior During Therapy  [facility] for tasks assessed/performed       Past Medical History:  Diagnosis Date  . Osteoporosis   . Rheumatoid arthritis [facility])     Past Surgical History:  Procedure Laterality Date  . INTRAMEDULLARY (IM) NAIL INTERTROCHANTERIC Right 03/14/2015   Procedure: RIGHT INTERTROCHANTRIC INTRAMEDULLARY (IM) NAIL ;  Surgeon: Mcarthur Rossetti, MD;  Location: [place];  Service: Orthopedics;  Laterality: Right;    There were no vitals filed for this visit.    Wound Therapy - 10/25/18 1442    Subjective  Patient reports no issues today. She states she is doing well and has no pain.    Patient and Family Stated Goals  wound to heal     Date of Onset  05/05/18    Prior Treatments  self care, antibiotics     Pain Scale  0-10    Pain Score  0-No pain    Evaluation and Treatment Procedures Explained to Patient/Family  Yes    Evaluation and Treatment Procedures  agreed to    Wound Properties Date First Assessed: 08/03/18 Time First Assessed: 0949 Wound Type: Puncture Location: Leg Location Orientation: Right Present on Admission: Yes   Dressing  Type  Gauze (Comment);Silver hydrofiber;Impregnated gauze (bismuth)   xeroform, alginate, petroleum around margins   Dressing Changed  Changed    Dressing Status  Old drainage    Dressing Change Frequency  PRN    Site / Wound Assessment  Granulation tissue    % Wound base Red or Granulating  100%   100% bright pink   % Wound base Black/Eschar  0%    Peri-wound Assessment  Intact;Maceration   slight maceration along lateral boarder   Wound Length (cm)  1.6 cm   1.7   Wound Width (cm)  0.5 cm   0.6   Wound Depth (cm)  0.1 cm   0.2   Wound Volume (cm^3)  0.08 cm^3    Wound Surface Area (cm^2)  0.8 cm^2    Margins  Attached edges (approximated)    Drainage Amount  Scant    Drainage Description  Serosanguineous    Treatment  Cleansed;Debridement (Selective)    Selective Debridement - Location  wound bed, perimeter    Selective Debridement - Tools Used  Forceps    Selective Debridement - Tissue Removed  slough and devitalized tissues     Wound Therapy - Clinical Statement  Patient with no maceration to wound or periwound this date after  changing dressing back to xeroform. Wound has gradual reduction in size and continued with selective debridement to margins to reduce epiboled edges. I am concerned the edges are epiboled and she may benefit from silver nitrate if that is the care. She will continue to benefit from skilled wound care to promote healing environment.     Wound Therapy - Frequency  3X / week   4 additional weeks   Wound Therapy - Current Recommendations  PT    Wound Plan  continue with wound care and appropriate dressings. Measure wednesdays.  10th visit PN next visit.    Dressing   xeroform, 2x2, medipore tape         PT Short Term Goals - 09/04/18 1212      PT SHORT TERM GOAL #1   Title  PT wound to be 100% granulated to decrease cellulitis    Time  2    Period  Weeks    Status  Partially Met    Target Date  08/17/18      PT SHORT TERM GOAL #2   Title  Pt pain  to be no greater than a 4/10 to allow pt to be able to tolerate standing/walking for up  to 30 minutes without increased pain for improved functional ability.     Time  2    Period  Weeks    Status  Achieved        PT Long Term Goals - 09/04/18 1212      PT LONG TERM GOAL #1   Title  Wound size to be decreased to no greater than 1.5x1.5 cm to allow pt to feel comfortable to complete self care for wound.     Time  4    Period  Weeks    Status  On-going      PT LONG TERM GOAL #2   Title  Pt to be able to vocalize the benefits of wearing compression garments for varicosities.     Time  4    Period  Weeks    Status  On-going        Plan - 10/25/18 1440    Clinical Impression Statement  see above    Rehab Potential  Good    PT Frequency  3x / week    PT Duration  3 weeks   3 more   PT Treatment/Interventions  ADLs/Self Care Home Management;Patient/family education;Other (comment)   debridement and dressing change    PT Next Visit Plan  see above    Consulted and Agree with Plan of Care  Patient       Patient will benefit from skilled therapeutic intervention in order to improve the following deficits and impairments:  Decreased skin integrity, Pain  Visit Diagnosis: Open leg wound, right, sequela  Pain in right lower leg     Problem List Patient Active Problem List   Diagnosis Date Noted  . Rash and nonspecific skin eruption 04/06/2015  . Acute blood loss anemia 03/22/2015  . Edema 03/21/2015  . Acute renal failure syndrome ([place])   . Closed right hip fracture ([place]) 03/14/2015  . Rheumatoid arthritis ([place]) 03/14/2015  . Fall 03/14/2015    Kipp Brood, PT, DPT, [facility] Physical Therapist with [facility]  10/25/2018 2:54 PM    [facility] [address] [place], [name], [address] Phone: [phone]   Fax:  [phone]  Name: Michaela Simpson MRN: [id number] Date of Birth:  01-16-1932

## 2018-10-27 ENCOUNTER — Ambulatory Visit (HOSPITAL_COMMUNITY): Payer: Medicare Other | Admitting: Physical Therapy

## 2018-10-27 ENCOUNTER — Other Ambulatory Visit: Payer: Self-pay

## 2018-10-27 DIAGNOSIS — S81801S Unspecified open wound, right lower leg, sequela: Secondary | ICD-10-CM

## 2018-10-27 DIAGNOSIS — M79661 Pain in right lower leg: Secondary | ICD-10-CM | POA: Diagnosis not present

## 2018-10-27 NOTE — Therapy (Signed)
[place], [name], [address] Phone: [phone]   Fax:  [phone]  Wound Care Therapy  Patient Details  Name: Michaela Simpson MRN: [id number] Date of Birth: 05-12-1932 Referring Provider (PT): Dr. Allyn Kenner   Encounter Date: 10/27/2018  PT End of Session - 10/27/18 1331    Visit Number  28   progress note completed visit #24   Number of Visits  34    Date for PT Re-Evaluation  10/16/18    Authorization Type  Medicare Part A & B    Authorization Time Period  08/03/18- 09/02/18; 09/04/2018 - 10/02/2018; new 09/25/18-10/16/18; 10/16/18-10/30/18    Authorization - Visit Number  6    Authorization - Number of Visits  10    PT Start Time  0950    PT Stop Time  1005    PT Time Calculation (min)  15 min    Activity Tolerance  Patient tolerated treatment well    Behavior During Therapy  [facility] for tasks assessed/performed       Past Medical History:  Diagnosis Date  . Osteoporosis   . Rheumatoid arthritis [name])     Past Surgical History:  Procedure Laterality Date  . INTRAMEDULLARY (IM) NAIL INTERTROCHANTERIC Right 03/14/2015   Procedure: RIGHT INTERTROCHANTRIC INTRAMEDULLARY (IM) NAIL ;  Surgeon: Mcarthur Rossetti, MD;  Location: [place];  Service: Orthopedics;  Laterality: Right;    There were no vitals filed for this visit.              Wound Therapy - 10/27/18 1308    Subjective  Patient reports no issues today. She states she is doing well and has no pain.    Patient and Family Stated Goals  wound to heal     Date of Onset  05/05/18    Prior Treatments  self care, antibiotics     Evaluation and Treatment Procedures Explained to Patient/Family  Yes    Evaluation and Treatment Procedures  agreed to    Wound Properties Date First Assessed: 08/03/18 Time First Assessed: 0949 Wound Type: Puncture Location: Leg Location Orientation: Right Present on Admission: Yes   Dressing Type  Gauze  (Comment);Silver hydrofiber;Impregnated gauze (bismuth)   xeroform, alginate, petroleum around margins   Dressing Status  Old drainage    Dressing Change Frequency  PRN    Site / Wound Assessment  Granulation tissue    % Wound base Red or Granulating  100%   100% bright pink   % Wound base Black/Eschar  0%    Peri-wound Assessment  Intact;Maceration   slight maceration along lateral boarder   Wound Length (cm)  0.8 cm    Wound Width (cm)  0.4 cm    Wound Depth (cm)  0.1 cm    Wound Volume (cm^3)  0.03 cm^3    Wound Surface Area (cm^2)  0.32 cm^2    Margins  Attached edges (approximated)    Drainage Amount  Scant    Drainage Description  Serosanguineous    Treatment  Cleansed;Debridement (Selective)    Selective Debridement - Location  wound bed, perimeter    Selective Debridement - Tools Used  Forceps    Selective Debridement - Tissue Removed  slough and devitalized tissues     Wound Therapy - Clinical Statement  wound decreased in length half of visit 2 days ago.  Cleansed well and debrided the edges to promote further approximation.  continued with moisturizing to the edges and  use of xeroform witin borders of wound.  No hypergranluation present this session.      Wound Therapy - Frequency  3X / week   4 additional weeks   Wound Therapy - Current Recommendations  PT    Wound Plan  continue with wound care and appropriate dressings. Measure wednesdays.    Dressing   xeroform, 2x2, medipore tape                PT Short Term Goals - 09/04/18 1212      PT SHORT TERM GOAL #1   Title  PT wound to be 100% granulated to decrease cellulitis    Time  2    Period  Weeks    Status  Partially Met    Target Date  08/17/18      PT SHORT TERM GOAL #2   Title  Pt pain to be no greater than a 4/10 to allow pt to be able to tolerate standing/walking for up  to 30 minutes without increased pain for improved functional ability.     Time  2    Period  Weeks    Status  Achieved         PT Long Term Goals - 09/04/18 1212      PT LONG TERM GOAL #1   Title  Wound size to be decreased to no greater than 1.5x1.5 cm to allow pt to feel comfortable to complete self care for wound.     Time  4    Period  Weeks    Status  On-going      PT LONG TERM GOAL #2   Title  Pt to be able to vocalize the benefits of wearing compression garments for varicosities.     Time  4    Period  Weeks    Status  On-going              Patient will benefit from skilled therapeutic intervention in order to improve the following deficits and impairments:     Visit Diagnosis: Open leg wound, right, sequela     Problem List Patient Active Problem List   Diagnosis Date Noted  . Rash and nonspecific skin eruption 04/06/2015  . Acute blood loss anemia 03/22/2015  . Edema 03/21/2015  . Acute renal failure syndrome ([place])   . Closed right hip fracture ([place]) 03/14/2015  . Rheumatoid arthritis ([place]) 03/14/2015  . Fall 03/14/2015   Teena Irani, PTA/CLT [phone]  Teena Irani 10/27/2018, 1:37 PM  [facility] [address] [place], [name], [address] Phone: [phone]   Fax:  [phone]  Name: Michaela Simpson MRN: [id number] Date of Birth: 25-Feb-1932

## 2018-10-30 ENCOUNTER — Ambulatory Visit (HOSPITAL_COMMUNITY): Payer: Medicare Other

## 2018-10-30 ENCOUNTER — Encounter (HOSPITAL_COMMUNITY): Payer: Self-pay

## 2018-10-30 ENCOUNTER — Other Ambulatory Visit: Payer: Self-pay

## 2018-10-30 DIAGNOSIS — S81801S Unspecified open wound, right lower leg, sequela: Secondary | ICD-10-CM | POA: Diagnosis not present

## 2018-10-30 DIAGNOSIS — M79661 Pain in right lower leg: Secondary | ICD-10-CM | POA: Diagnosis not present

## 2018-10-30 NOTE — Therapy (Signed)
[place], [name], [address] Phone: [phone]   Fax:  [phone]  Wound Care Therapy/Progress Note  Patient Details  Name: Michaela Simpson MRN: [id number] Date of Birth: [birth year] Referring Provider (PT): Dr. Allyn Kenner   Encounter Date: 10/30/2018   Progress Note Reporting Period 10/30/18 to 11/27/18  See note below for Objective Data and Assessment of Progress/Goals.    PT End of Session - 10/30/18 1410    Visit Number  29   progress note completed visit #24   Number of Visits  34    Date for PT Re-Evaluation  10/16/18    Authorization Type  Medicare Part A & B    Authorization Time Period  08/03/18- 09/02/18; 09/04/2018 - 10/02/2018; new 09/25/18-10/16/18; 10/16/18-10/30/18    Authorization - Visit Number  7    Authorization - Number of Visits  10    PT Start Time  0950    PT Stop Time  1027    PT Time Calculation (min)  37 min    Activity Tolerance  Patient tolerated treatment well    Behavior During Therapy  WFL for tasks assessed/performed       Past Medical History:  Diagnosis Date  . Osteoporosis   . Rheumatoid arthritis [facility])     Past Surgical History:  Procedure Laterality Date  . INTRAMEDULLARY (IM) NAIL INTERTROCHANTERIC Right 03/14/2015   Procedure: RIGHT INTERTROCHANTRIC INTRAMEDULLARY (IM) NAIL ;  Surgeon: Mcarthur Rossetti, MD;  Location: [place];  Service: Orthopedics;  Laterality: Right;    There were no vitals filed for this visit.    Wound Therapy - 10/30/18 1353    Subjective  Patient reports her woun dhas been bothering her more since the last session. She states it feels sensitive, tender, and sore.    Patient and Family Stated Goals  wound to heal     Date of Onset  05/05/18    Prior Treatments  self care, antibiotics     Pain Scale  0-10    Pain Score  0-No pain    Evaluation and Treatment Procedures Explained to Patient/Family  Yes    Evaluation and Treatment  Procedures  agreed to    Wound Properties Date First Assessed: 08/03/18 Time First Assessed: 0949 Wound Type: Puncture Location: Leg Location Orientation: Right Present on Admission: Yes   Dressing Type  Gauze (Comment);Silver hydrofiber   xeroform, alginate, petroleum around margins   Dressing Changed  Changed    Dressing Status  Old drainage    Dressing Change Frequency  PRN    Site / Wound Assessment  Granulation tissue    % Wound base Red or Granulating  100%   100% bright pink   % Wound base Black/Eschar  0%    Peri-wound Assessment  Intact;Maceration   slight maceration along lateral boarder   Wound Length (cm)  2 cm    Wound Width (cm)  0.8 cm    Wound Depth (cm)  0.3 cm    Wound Volume (cm^3)  0.48 cm^3    Wound Surface Area (cm^2)  1.6 cm^2    Margins  Attached edges (approximated)    Drainage Amount  Scant    Drainage Description  Serosanguineous    Treatment  Cleansed;Debridement (Selective);Hydrotherapy (Pulse lavage)    Selective Debridement - Location  wound bed, perimeter    Selective Debridement - Tools Used  Forceps    Selective Debridement - Tissue Removed  slough and  devitalized tissues     Wound Therapy - Clinical Statement  Patient's wound size has increased since last Friday and wound has increased drainage this date. Periwound and margins are raised up and puffy compared to last week and patient presents with some erythema around wound. Patient has increased tenderness and sensitivity to debridement but was able to remove slough and devitalized tissues from wound edges. Performed pulsed lavage to irrigate wound bed to remove slough. Patient has greatest tenderness around [place] of healed skin and small pocket wound opening is present lateral to this island. It appeared to have purulent drainage present underneath it. Educated patient that she will need another round of antibiotics likely. Will call MD and determine if they would like a culture. Applied silver  hydrofiber to wound due to increased drainage. She will continue to benefit from skilled wound care to promote healing.     Hydrotherapy Plan  Debridement;Dressing change;Patient/family education;Pulsatile lavage with suction    Wound Therapy - Frequency  3X / week   4 additional weeks   Wound Therapy - Current Recommendations  PT    Wound Plan  continue with wound care and appropriate dressings. Measure wednesdays.    Dressing   silver hydrofiber 2x2, medipore tape         PT Short Term Goals - 09/04/18 1212      PT SHORT TERM GOAL #1   Title  PT wound to be 100% granulated to decrease cellulitis    Time  2    Period  Weeks    Status  Partially Met    Target Date  08/17/18      PT SHORT TERM GOAL #2   Title  Pt pain to be no greater than a 4/10 to allow pt to be able to tolerate standing/walking for up  to 30 minutes without increased pain for improved functional ability.     Time  2    Period  Weeks    Status  Achieved        PT Long Term Goals - 09/04/18 1212      PT LONG TERM GOAL #1   Title  Wound size to be decreased to no greater than 1.5x1.5 cm to allow pt to feel comfortable to complete self care for wound.     Time  4    Period  Weeks    Status  On-going      PT LONG TERM GOAL #2   Title  Pt to be able to vocalize the benefits of wearing compression garments for varicosities.     Time  4    Period  Weeks    Status  On-going        Plan - 10/30/18 1411    Clinical Impression Statement  see above    Rehab Potential  Good    PT Frequency  3x / week    PT Duration  3 weeks   3 more   PT Treatment/Interventions  ADLs/Self Care Home Management;Patient/family education;Other (comment)   debridement and dressing change    PT Next Visit Plan  see above    Consulted and Agree with Plan of Care  Patient       Patient will benefit from skilled therapeutic intervention in order to improve the following deficits and impairments:  Decreased skin integrity,  Pain  Visit Diagnosis: Open leg wound, right, sequela  Pain in right lower leg     Problem List Patient Active Problem List  Diagnosis Date Noted  . Rash and nonspecific skin eruption 04/06/2015  . Acute blood loss anemia 03/22/2015  . Edema 03/21/2015  . Acute renal failure syndrome ([place])   . Closed right hip fracture ([place]) 03/14/2015  . Rheumatoid arthritis ([place]) 03/14/2015  . Fall 03/14/2015    Kipp Brood, PT, DPT, [facility] Physical Therapist with [facility]  10/30/2018 2:16 PM    [place], [name], [address] Phone: [phone]   Fax:  [phone]  Name: Michaela Simpson MRN: [id number] Date of Birth: May 26, 1932

## 2018-11-01 ENCOUNTER — Encounter (HOSPITAL_COMMUNITY): Payer: Self-pay

## 2018-11-01 ENCOUNTER — Ambulatory Visit (HOSPITAL_COMMUNITY): Payer: Medicare Other

## 2018-11-01 ENCOUNTER — Other Ambulatory Visit: Payer: Self-pay

## 2018-11-01 ENCOUNTER — Other Ambulatory Visit (HOSPITAL_COMMUNITY)
Admission: RE | Admit: 2018-11-01 | Discharge: 2018-11-01 | Disposition: A | Discharge status: Home / Self Care | Payer: Medicare Other | Source: Other Acute Inpatient Hospital | Attending: Internal Medicine | Admitting: Internal Medicine

## 2018-11-01 DIAGNOSIS — M79661 Pain in right lower leg: Secondary | ICD-10-CM | POA: Diagnosis not present

## 2018-11-01 DIAGNOSIS — L03115 Cellulitis of right lower limb: Secondary | ICD-10-CM | POA: Diagnosis not present

## 2018-11-01 DIAGNOSIS — L97911 Non-pressure chronic ulcer of unspecified part of right lower leg limited to breakdown of skin: Secondary | ICD-10-CM | POA: Insufficient documentation

## 2018-11-01 DIAGNOSIS — S81801S Unspecified open wound, right lower leg, sequela: Secondary | ICD-10-CM

## 2018-11-01 NOTE — Therapy (Signed)
[place], [name], [address] Phone: [phone]   Fax:  [phone]  Wound Care Therapy  Patient Details  Name: Michaela Simpson MRN: [id number] Date of Birth: Jan 17, 1932 Referring Provider (PT): Dr. Allyn Kenner   Encounter Date: 11/01/2018  PT End of Session - 11/01/18 1238    Visit Number  30   progress note completed visit #24   Number of Visits  46    Date for PT Re-Evaluation  11/27/18    Authorization Type  Medicare Part A & B    Authorization Time Period  08/03/18- 09/02/18; 09/04/2018 - 10/02/2018; 09/25/18-10/16/18; 10/16/18-10/30/18; new 10/30/18-11/27/18    Authorization - Visit Number  2    Authorization - Number of Visits  10    PT Start Time  0945    PT Stop Time  1025    PT Time Calculation (min)  40 min    Activity Tolerance  Patient tolerated treatment well    Behavior During Therapy  [facility] for tasks assessed/performed       Past Medical History:  Diagnosis Date  . Osteoporosis   . Rheumatoid arthritis [facility])     Past Surgical History:  Procedure Laterality Date  . INTRAMEDULLARY (IM) NAIL INTERTROCHANTERIC Right 03/14/2015   Procedure: RIGHT INTERTROCHANTRIC INTRAMEDULLARY (IM) NAIL ;  Surgeon: Mcarthur Rossetti, MD;  Location: [place];  Service: Orthopedics;  Laterality: Right;    There were no vitals filed for this visit.    Wound Therapy - 11/01/18 1231    Subjective  Patient arrives with dressing intact. She reports her leg was very sore following her last session but yesterday she took some alieve and it has heped. She states it is not hurting at start of session.     Patient and Family Stated Goals  wound to heal     Date of Onset  05/05/18    Prior Treatments  self care, antibiotics     Pain Scale  0-10    Pain Score  0-No pain    Evaluation and Treatment Procedures Explained to Patient/Family  Yes    Evaluation and Treatment Procedures  agreed to    Wound Properties Date First  Assessed: 08/03/18 Time First Assessed: 0949 Wound Type: Puncture Location: Leg Location Orientation: Right Present on Admission: Yes   Dressing Type  Gauze (Comment);Silver hydrofiber;Impregnated gauze (bismuth)   xeroform, alginate, petroleum around margins   Dressing Changed  Changed    Dressing Status  Old drainage    Dressing Change Frequency  PRN    Site / Wound Assessment  Granulation tissue    % Wound base Red or Granulating  90%   100% bright pink   % Wound base Yellow/Fibrinous Exudate  10%    % Wound base Black/Eschar  0%    Peri-wound Assessment  Intact;Edema   margins are "puffy"   Margins  Attached edges (approximated)    Drainage Amount  Minimal    Drainage Description  Serosanguineous    Treatment  Cleansed;Debridement (Selective)    Selective Debridement - Location  wound bed, perimeter    Selective Debridement - Tools Used  Forceps;Scissors    Selective Debridement - Tissue Removed  slough and devitalized tissues     Wound Therapy - Clinical Statement  Patients wound with reduced drainage this date upon dressing removal. Margins of wound are dry and inferiorly pt had tunnel noted under thin layer of dried skin. Selective debridement peformed to margins  to remove dried slough and devitalized tissue. Removal of devitalized tissue revealed wound bed present distally and opened narrow tunnel that was previously not visible. Patient has pockets along medial boarder of wound and continues to present with erythema around periwound. Swab culture was taken this session and picture sent to Dr. [place] Crane with patient's consent. After cleansing and debridement applied xeroform directly to wound bed and silver hydrofiber overtop to manage drainage. Patient will continue to benefit from skilled wound care.     Hydrotherapy Plan  Debridement;Dressing change;Patient/family education;Pulsatile lavage with suction    Wound Therapy - Frequency  3X / week   4 additional weeks   Wound Therapy -  Current Recommendations  PT    Wound Plan  continue with wound care and appropriate dressings. Measure on Mondays.    Dressing   silver hydrofiber 2x2, medipore tape         PT Short Term Goals - 09/04/18 1212      PT SHORT TERM GOAL #1   Title  PT wound to be 100% granulated to decrease cellulitis    Time  2    Period  Weeks    Status  Partially Met    Target Date  08/17/18      PT SHORT TERM GOAL #2   Title  Pt pain to be no greater than a 4/10 to allow pt to be able to tolerate standing/walking for up  to 30 minutes without increased pain for improved functional ability.     Time  2    Period  Weeks    Status  Achieved        PT Long Term Goals - 09/04/18 1212      PT LONG TERM GOAL #1   Title  Wound size to be decreased to no greater than 1.5x1.5 cm to allow pt to feel comfortable to complete self care for wound.     Time  4    Period  Weeks    Status  On-going      PT LONG TERM GOAL #2   Title  Pt to be able to vocalize the benefits of wearing compression garments for varicosities.     Time  4    Period  Weeks    Status  On-going       Plan - 11/01/18 1244    Clinical Impression Statement  see above    Rehab Potential  Good    PT Frequency  3x / week    PT Duration  4 weeks   4 more   PT Treatment/Interventions  ADLs/Self Care Home Management;Patient/family education;Other (comment)   debridement and dressing change    PT Next Visit Plan  see above    Consulted and Agree with Plan of Care  Patient       Patient will benefit from skilled therapeutic intervention in order to improve the following deficits and impairments:  Decreased skin integrity, Pain  Visit Diagnosis: Open leg wound, right, sequela  Pain in right lower leg     Problem List Patient Active Problem List   Diagnosis Date Noted  . Rash and nonspecific skin eruption 04/06/2015  . Acute blood loss anemia 03/22/2015  . Edema 03/21/2015  . Acute renal failure syndrome ([place])   .  Closed right hip fracture ([place]) 03/14/2015  . Rheumatoid arthritis ([place]) 03/14/2015  . Fall 03/14/2015    Kipp Brood, PT, DPT, [facility] Physical Therapist with [facility]  11/01/2018 12:45  PM    [place], [name], [address] Phone: [phone]   Fax:  [phone]  Name: Michaela Simpson MRN: [id number] Date of Birth: 09/10/1931

## 2018-11-03 ENCOUNTER — Ambulatory Visit (HOSPITAL_COMMUNITY): Payer: Medicare Other

## 2018-11-03 ENCOUNTER — Encounter (HOSPITAL_COMMUNITY): Payer: Self-pay

## 2018-11-03 ENCOUNTER — Other Ambulatory Visit: Payer: Self-pay

## 2018-11-03 DIAGNOSIS — S81801S Unspecified open wound, right lower leg, sequela: Secondary | ICD-10-CM

## 2018-11-03 DIAGNOSIS — M79661 Pain in right lower leg: Secondary | ICD-10-CM | POA: Diagnosis not present

## 2018-11-03 NOTE — Therapy (Signed)
[place], [name], [address] Phone: [phone]   Fax:  [phone]  Wound Care Therapy  Patient Details  Name: Michaela Simpson MRN: [id number] Date of Birth: July 08, 1931 Referring Provider (PT): Dr. Allyn Kenner   Encounter Date: 11/03/2018  PT End of Session - 11/03/18 1116    Visit Number  31   progress note completed visit #24   Number of Visits  46    Date for PT Re-Evaluation  11/27/18    Authorization Type  Medicare Part A & B    Authorization Time Period  08/03/18- 09/02/18; 09/04/2018 - 10/02/2018; 09/25/18-10/16/18; 10/16/18-10/30/18; new 10/30/18-11/27/18    Authorization - Visit Number  3    Authorization - Number of Visits  10    PT Start Time  [phone]    PT Stop Time  1024    PT Time Calculation (min)  38 min    Activity Tolerance  Patient tolerated treatment well    Behavior During Therapy  [facility] for tasks assessed/performed       Past Medical History:  Diagnosis Date  . Osteoporosis   . Rheumatoid arthritis [facility])     Past Surgical History:  Procedure Laterality Date  . INTRAMEDULLARY (IM) NAIL INTERTROCHANTERIC Right 03/14/2015   Procedure: RIGHT INTERTROCHANTRIC INTRAMEDULLARY (IM) NAIL ;  Surgeon: Mcarthur Rossetti, MD;  Location: [place];  Service: Orthopedics;  Laterality: Right;    There were no vitals filed for this visit.   Wound Therapy - 11/03/18 1032    Subjective  Patient arrives with dressing intact and no complaints. She reports ~2/10 pain today but states it isn't sharp it is more like a sensitivity. She reports Advil has helped.    Patient and Family Stated Goals  wound to heal     Date of Onset  05/05/18    Prior Treatments  self care, antibiotics     Pain Scale  0-10    Pain Score  2     Pain Type  Chronic pain    Pain Location  Leg    Pain Orientation  Right;Anterior;Lower    Pain Descriptors / Indicators  --   sensitive   Pain Onset  Gradual    Patients Stated Pain Goal   0    Pain Intervention(s)  Distraction;Emotional support    Multiple Pain Sites  No    Evaluation and Treatment Procedures Explained to Patient/Family  Yes    Evaluation and Treatment Procedures  agreed to    Wound Properties Date First Assessed: 08/03/18 Time First Assessed: 0949 Wound Type: Puncture Location: Leg Location Orientation: Right Present on Admission: Yes   Dressing Type  Gauze (Comment);Silver hydrofiber;Impregnated gauze (bismuth)   xeroform, alginate, petroleum around margins   Dressing Changed  Changed    Dressing Status  Old drainage    Dressing Change Frequency  PRN    Site / Wound Assessment  Granulation tissue    % Wound base Red or Granulating  90%   100% bright pink   % Wound base Yellow/Fibrinous Exudate  10%    % Wound base Black/Eschar  0%    Peri-wound Assessment  Intact;Edema   margins are "puffy"   Wound Length (cm)  2.2 cm   2   Wound Width (cm)  0.7 cm   0.8   Wound Depth (cm)  0.2 cm   0.3   Wound Volume (cm^3)  0.31 cm^3    Wound Surface Area (cm^2)  1.54 cm^2    Tunneling (cm)  small pockets discovered in periwound along medial boarder in the middle of the wound and distally along the medial boarder    Margins  Attached edges (approximated)    Drainage Amount  Minimal    Drainage Description  Serosanguineous    Treatment  Cleansed;Debridement (Selective);Irrigation   syringe to irrigate pockets along wound margins   Selective Debridement - Location  wound bed, perimeter    Selective Debridement - Tools Used  Forceps;Scissors    Selective Debridement - Tissue Removed  slough and devitalized tissues     Wound Therapy - Clinical Statement  Patients wound continues to present with reduced drainage and tunnels located at medial wound margin in center remain open and are filing in. A new pocket/tunnel was found along the medial margin distally and purulent drainage was expulsed from it with light pressure around the pocket. Cleansed wound with saline and  debrided dray margins to maintain open edges for approximation. Continued with xeroform directly to wound bed and silver hydrofiber overtop to manage drainage. Patient will continue to benefit from skilled wound care.      Hydrotherapy Plan  Debridement;Dressing change;Patient/family education;Pulsatile lavage with suction    Wound Therapy - Frequency  3X / week   4 additional weeks   Wound Therapy - Current Recommendations  PT    Wound Plan  continue with wound care and appropriate dressings. Measure on Mondays.    Dressing   silver hydrofiber 2x2, medipore tape        PT Short Term Goals - 09/04/18 1212      PT SHORT TERM GOAL #1   Title  PT wound to be 100% granulated to decrease cellulitis    Time  2    Period  Weeks    Status  Partially Met    Target Date  08/17/18      PT SHORT TERM GOAL #2   Title  Pt pain to be no greater than a 4/10 to allow pt to be able to tolerate standing/walking for up  to 30 minutes without increased pain for improved functional ability.     Time  2    Period  Weeks    Status  Achieved        PT Long Term Goals - 09/04/18 1212      PT LONG TERM GOAL #1   Title  Wound size to be decreased to no greater than 1.5x1.5 cm to allow pt to feel comfortable to complete self care for wound.     Time  4    Period  Weeks    Status  On-going      PT LONG TERM GOAL #2   Title  Pt to be able to vocalize the benefits of wearing compression garments for varicosities.     Time  4    Period  Weeks    Status  On-going        Plan - 11/03/18 1116    Clinical Impression Statement  see above    Rehab Potential  Good    PT Frequency  3x / week    PT Duration  4 weeks   4 more   PT Treatment/Interventions  ADLs/Self Care Home Management;Patient/family education;Other (comment)   debridement and dressing change    PT Next Visit Plan  see above    Consulted and Agree with Plan of Care  Patient       Patient will benefit from  skilled therapeutic  intervention in order to improve the following deficits and impairments:  Decreased skin integrity, Pain  Visit Diagnosis: Open leg wound, right, sequela  Pain in right lower leg     Problem List Patient Active Problem List   Diagnosis Date Noted  . Rash and nonspecific skin eruption 04/06/2015  . Acute blood loss anemia 03/22/2015  . Edema 03/21/2015  . Acute renal failure syndrome ([place])   . Closed right hip fracture ([place]) 03/14/2015  . Rheumatoid arthritis ([place]) 03/14/2015  . Fall 03/14/2015    Kipp Brood, PT, DPT, [facility] Physical Therapist with [facility]  11/03/2018 11:17 AM    [place] [address] [place], [name], [address] Phone: [phone]   Fax:  [phone]  Name: SARRAH FIORENZA MRN: [id number] Date of Birth: 1931/11/09

## 2018-11-04 LAB — AEROBIC CULTURE? (SUPERFICIAL SPECIMEN)

## 2018-11-04 LAB — AEROBIC CULTURE W GRAM STAIN (SUPERFICIAL SPECIMEN)
Culture: NO GROWTH
Gram Stain: NONE SEEN

## 2018-11-06 ENCOUNTER — Ambulatory Visit (HOSPITAL_COMMUNITY): Payer: Medicare Other | Admitting: Physical Therapy

## 2018-11-06 ENCOUNTER — Other Ambulatory Visit: Payer: Self-pay

## 2018-11-06 DIAGNOSIS — M79661 Pain in right lower leg: Secondary | ICD-10-CM

## 2018-11-06 DIAGNOSIS — S81801S Unspecified open wound, right lower leg, sequela: Secondary | ICD-10-CM | POA: Diagnosis not present

## 2018-11-06 NOTE — Therapy (Signed)
[place], [name], [address] Phone: [phone]   Fax:  [phone]  Wound Care Therapy  Patient Details  Name: Michaela Simpson MRN: [id number] Date of Birth: [birth year] Referring Provider (PT): Dr. Allyn Kenner   Encounter Date: 11/06/2018  PT End of Session - 11/06/18 1622    Visit Number  32   progress note completed visit #24   Number of Visits  46    Date for PT Re-Evaluation  11/27/18    Authorization Type  Medicare Part A & B    Authorization Time Period  08/03/18- 09/02/18; 09/04/2018 - 10/02/2018; 09/25/18-10/16/18; 10/16/18-10/30/18; new 10/30/18-11/27/18    Authorization - Visit Number  4    Authorization - Number of Visits  10    PT Start Time  1118    PT Stop Time  1145    PT Time Calculation (min)  27 min    Activity Tolerance  Patient tolerated treatment well    Behavior During Therapy  [facility] for tasks assessed/performed       Past Medical History:  Diagnosis Date  . Osteoporosis   . Rheumatoid arthritis [facility])     Past Surgical History:  Procedure Laterality Date  . INTRAMEDULLARY (IM) NAIL INTERTROCHANTERIC Right 03/14/2015   Procedure: RIGHT INTERTROCHANTRIC INTRAMEDULLARY (IM) NAIL ;  Surgeon: Mcarthur Rossetti, MD;  Location: [place];  Service: Orthopedics;  Laterality: Right;    There were no vitals filed for this visit.              Wound Therapy - 11/06/18 1618    Subjective  pt reports no issues.    Patient and Family Stated Goals  wound to heal     Date of Onset  05/05/18    Prior Treatments  self care, antibiotics     Pain Scale  0-10    Pain Score  2     Pain Type  Chronic pain    Pain Location  Leg    Pain Orientation  Right;Anterior    Pain Onset  Gradual    Evaluation and Treatment Procedures Explained to Patient/Family  Yes    Evaluation and Treatment Procedures  agreed to    Wound Properties Date First Assessed: 08/03/18 Time First Assessed: 0949 Wound Type:  Puncture Location: Leg Location Orientation: Right Present on Admission: Yes   Dressing Type  Gauze (Comment);Silver hydrofiber;Impregnated gauze (bismuth)   xeroform, alginate, petroleum around margins   Dressing Status  Old drainage    Dressing Change Frequency  PRN    Site / Wound Assessment  Granulation tissue    % Wound base Red or Granulating  95%   100% bright pink   % Wound base Yellow/Fibrinous Exudate  5%    % Wound base Black/Eschar  0%    Peri-wound Assessment  Intact;Edema   margins are "puffy"   Wound Length (cm)  2.2 cm    Wound Width (cm)  0.7 cm    Wound Depth (cm)  0.2 cm    Wound Volume (cm^3)  0.31 cm^3    Wound Surface Area (cm^2)  1.54 cm^2    Margins  Attached edges (approximated)    Drainage Amount  Minimal    Drainage Description  Serosanguineous    Treatment  Cleansed;Debridement (Selective)    Selective Debridement - Location  wound bed, perimeter    Selective Debridement - Tools Used  Forceps;Scissors    Selective Debridement - Tissue Removed  slough  and devitalized tissues     Wound Therapy - Clinical Statement  Improvement of wound today with increased granluation and approximation as compared to last visit.  Debrided edge of dry skin and slought.  Good tolerance to debridement.  continued with mixture of xeroform and silver hydrofiber.      Hydrotherapy Plan  Debridement;Dressing change;Patient/family education;Pulsatile lavage with suction    Wound Therapy - Frequency  3X / week   4 additional weeks   Wound Therapy - Current Recommendations  PT    Wound Plan  continue with wound care and appropriate dressings. Measure on Mondays.    Dressing   silver hydrofiber 2x2, medipore tape                PT Short Term Goals - 09/04/18 1212      PT SHORT TERM GOAL #1   Title  PT wound to be 100% granulated to decrease cellulitis    Time  2    Period  Weeks    Status  Partially Met    Target Date  08/17/18      PT SHORT TERM GOAL #2   Title  Pt  pain to be no greater than a 4/10 to allow pt to be able to tolerate standing/walking for up  to 30 minutes without increased pain for improved functional ability.     Time  2    Period  Weeks    Status  Achieved        PT Long Term Goals - 09/04/18 1212      PT LONG TERM GOAL #1   Title  Wound size to be decreased to no greater than 1.5x1.5 cm to allow pt to feel comfortable to complete self care for wound.     Time  4    Period  Weeks    Status  On-going      PT LONG TERM GOAL #2   Title  Pt to be able to vocalize the benefits of wearing compression garments for varicosities.     Time  4    Period  Weeks    Status  On-going              Patient will benefit from skilled therapeutic intervention in order to improve the following deficits and impairments:     Visit Diagnosis: Open leg wound, right, sequela  Pain in right lower leg     Problem List Patient Active Problem List   Diagnosis Date Noted  . Rash and nonspecific skin eruption 04/06/2015  . Acute blood loss anemia 03/22/2015  . Edema 03/21/2015  . Acute renal failure syndrome ([place])   . Closed right hip fracture ([place]) 03/14/2015  . Rheumatoid arthritis ([place]) 03/14/2015  . Fall 03/14/2015   Teena Irani, PTA/CLT [id number]  Maddy Graham B 11/06/2018, 4:24 PM  [place] [address] [place], [name], [address] Phone: [phone]   Fax:  [phone]  Name: Michaela Simpson MRN: [id number] Date of Birth: 07-15-1931

## 2018-11-08 ENCOUNTER — Ambulatory Visit (HOSPITAL_COMMUNITY): Payer: Medicare Other | Admitting: Physical Therapy

## 2018-11-08 ENCOUNTER — Other Ambulatory Visit: Payer: Self-pay

## 2018-11-08 DIAGNOSIS — S81801S Unspecified open wound, right lower leg, sequela: Secondary | ICD-10-CM

## 2018-11-08 DIAGNOSIS — M79661 Pain in right lower leg: Secondary | ICD-10-CM | POA: Diagnosis not present

## 2018-11-08 NOTE — Therapy (Signed)
[place], [name], [address] Phone: [phone]   Fax:  [phone]  Wound Care Therapy  Patient Details  Name: Michaela Simpson MRN: [id number] Date of Birth: 04/03/1932 Referring Provider (PT): Dr. Allyn Kenner   Encounter Date: 11/08/2018  PT End of Session - 11/08/18 1525    Visit Number  33   progress note completed visit #24   Number of Visits  46    Date for PT Re-Evaluation  11/27/18    Authorization Type  Medicare Part A & B    Authorization Time Period  08/03/18- 09/02/18; 09/04/2018 - 10/02/2018; 09/25/18-10/16/18; 10/16/18-10/30/18; new 10/30/18-11/27/18    Authorization - Visit Number  5    Authorization - Number of Visits  10    PT Start Time  1438    PT Stop Time  1500    PT Time Calculation (min)  22 min    Activity Tolerance  Patient tolerated treatment well    Behavior During Therapy  [facility] for tasks assessed/performed       Past Medical History:  Diagnosis Date  . Osteoporosis   . Rheumatoid arthritis [facility])     Past Surgical History:  Procedure Laterality Date  . INTRAMEDULLARY (IM) NAIL INTERTROCHANTERIC Right 03/14/2015   Procedure: RIGHT INTERTROCHANTRIC INTRAMEDULLARY (IM) NAIL ;  Surgeon: Mcarthur Rossetti, MD;  Location: [place];  Service: Orthopedics;  Laterality: Right;    There were no vitals filed for this visit.              Wound Therapy - 11/08/18 1522    Subjective  pt states "its' been talking to me since last visit".  No issues or increased drainage/infection.    Patient and Family Stated Goals  wound to heal     Date of Onset  05/05/18    Prior Treatments  self care, antibiotics     Pain Scale  0-10    Pain Score  2     Pain Type  Chronic pain    Pain Location  Leg    Pain Orientation  Right;Anterior    Evaluation and Treatment Procedures Explained to Patient/Family  Yes    Evaluation and Treatment Procedures  agreed to    Wound Properties Date First  Assessed: 08/03/18 Time First Assessed: 0949 Wound Type: Puncture Location: Leg Location Orientation: Right Present on Admission: Yes   Dressing Type  Gauze (Comment);Silver hydrofiber;Impregnated gauze (bismuth)   xeroform, alginate, petroleum around margins   Dressing Changed  Changed    Dressing Status  Old drainage    Dressing Change Frequency  PRN    Site / Wound Assessment  Granulation tissue    % Wound base Red or Granulating  95%   100% bright pink   % Wound base Yellow/Fibrinous Exudate  5%    % Wound base Black/Eschar  0%    Peri-wound Assessment  Intact;Edema   margins are "puffy"   Margins  Attached edges (approximated)    Drainage Amount  Minimal    Drainage Description  Serosanguineous    Treatment  Cleansed;Debridement (Selective)    Selective Debridement - Location  wound bed, perimeter    Selective Debridement - Tools Used  Forceps;Scissors    Selective Debridement - Tissue Removed  slough and devitalized tissues     Wound Therapy - Clinical Statement  slow healing noted.  Slight redness around perimeter, however no signs of infection.  Cleansed and debrided dry devitatlized tissue from  in and around wound.  Continued with xeroform, 2X2 and medipore tape to secure.  Pt reported wound "calmed down" after treatment.      Hydrotherapy Plan  Debridement;Dressing change;Patient/family education;Pulsatile lavage with suction    Wound Therapy - Frequency  3X / week   4 additional weeks   Wound Therapy - Current Recommendations  PT    Wound Plan  continue with wound care and appropriate dressings. Measure on Mondays.    Dressing   silver hydrofiber 2x2, medipore tape                PT Short Term Goals - 09/04/18 1212      PT SHORT TERM GOAL #1   Title  PT wound to be 100% granulated to decrease cellulitis    Time  2    Period  Weeks    Status  Partially Met    Target Date  08/17/18      PT SHORT TERM GOAL #2   Title  Pt pain to be no greater than a 4/10 to  allow pt to be able to tolerate standing/walking for up  to 30 minutes without increased pain for improved functional ability.     Time  2    Period  Weeks    Status  Achieved        PT Long Term Goals - 09/04/18 1212      PT LONG TERM GOAL #1   Title  Wound size to be decreased to no greater than 1.5x1.5 cm to allow pt to feel comfortable to complete self care for wound.     Time  4    Period  Weeks    Status  On-going      PT LONG TERM GOAL #2   Title  Pt to be able to vocalize the benefits of wearing compression garments for varicosities.     Time  4    Period  Weeks    Status  On-going              Patient will benefit from skilled therapeutic intervention in order to improve the following deficits and impairments:     Visit Diagnosis: Open leg wound, right, sequela  Pain in right lower leg     Problem List Patient Active Problem List   Diagnosis Date Noted  . Rash and nonspecific skin eruption 04/06/2015  . Acute blood loss anemia 03/22/2015  . Edema 03/21/2015  . Acute renal failure syndrome ([place])   . Closed right hip fracture ([place]) 03/14/2015  . Rheumatoid arthritis ([place]) 03/14/2015  . Fall 03/14/2015   Teena Irani, PTA/CLT [id number]  Teena Irani 11/08/2018, 3:26 PM  [place] [address] [place], [name], [address] Phone: [phone]   Fax:  [phone]  Name: Michaela Simpson MRN: [id number] Date of Birth: 03/08/1932

## 2018-11-10 ENCOUNTER — Ambulatory Visit (HOSPITAL_COMMUNITY): Payer: Medicare Other

## 2018-11-10 ENCOUNTER — Encounter (HOSPITAL_COMMUNITY): Payer: Self-pay

## 2018-11-10 ENCOUNTER — Other Ambulatory Visit: Payer: Self-pay

## 2018-11-10 DIAGNOSIS — S81801S Unspecified open wound, right lower leg, sequela: Secondary | ICD-10-CM | POA: Diagnosis not present

## 2018-11-10 DIAGNOSIS — M79661 Pain in right lower leg: Secondary | ICD-10-CM

## 2018-11-10 DIAGNOSIS — Z0000 Encounter for general adult medical examination without abnormal findings: Secondary | ICD-10-CM | POA: Diagnosis not present

## 2018-11-10 NOTE — Therapy (Signed)
[place], [name], [address] Phone: [phone]   Fax:  [phone]  Wound Care Therapy  Patient Details  Name: Michaela Simpson MRN: [id number] Date of Birth: 1931-08-01 Referring Provider (PT): Dr. Allyn Kenner   Encounter Date: 11/10/2018  PT End of Session - 11/10/18 1206    Visit Number  34   progress note completed visit #24   Number of Visits  46    Date for PT Re-Evaluation  11/27/18    Authorization Type  Medicare Part A & B    Authorization Time Period  08/03/18- 09/02/18; 09/04/2018 - 10/02/2018; 09/25/18-10/16/18; 10/16/18-10/30/18; new 10/30/18-11/27/18    Authorization - Visit Number  6    Authorization - Number of Visits  10    PT Start Time  1120    PT Stop Time  1152    PT Time Calculation (min)  32 min    Activity Tolerance  Patient tolerated treatment well    Behavior During Therapy  [facility] for tasks assessed/performed       Past Medical History:  Diagnosis Date  . Osteoporosis   . Rheumatoid arthritis [facility])     Past Surgical History:  Procedure Laterality Date  . INTRAMEDULLARY (IM) NAIL INTERTROCHANTERIC Right 03/14/2015   Procedure: RIGHT INTERTROCHANTRIC INTRAMEDULLARY (IM) NAIL ;  Surgeon: Mcarthur Rossetti, MD;  Location: [place];  Service: Orthopedics;  Laterality: Right;    There were no vitals filed for this visit.     Wound Therapy - 11/10/18 1153    Subjective  Patient arrives with dressing intact and denies concerns. She reports minimal pain and says it is more like a sensitivity.    Patient and Family Stated Goals  wound to heal     Date of Onset  05/05/18    Prior Treatments  self care, antibiotics     Pain Scale  0-10    Pain Score  2     Pain Type  Chronic pain    Pain Location  Leg    Pain Orientation  Right;Anterior;Lower    Pain Onset  Gradual    Patients Stated Pain Goal  0    Pain Intervention(s)  Distraction    Multiple Pain Sites  No    Evaluation and  Treatment Procedures Explained to Patient/Family  Yes    Evaluation and Treatment Procedures  agreed to    Wound Properties Date First Assessed: 08/03/18 Time First Assessed: 0949 Wound Type: Puncture Location: Leg Location Orientation: Right Present on Admission: Yes   Dressing Type  Gauze (Comment);Silver hydrofiber;Impregnated gauze (bismuth)   xeroform, alginate, petroleum around margins   Dressing Changed  Changed    Dressing Status  Old drainage    Dressing Change Frequency  PRN    Site / Wound Assessment  Granulation tissue    % Wound base Red or Granulating  95%   100% bright pink   % Wound base Yellow/Fibrinous Exudate  5%    % Wound base Black/Eschar  0%    Peri-wound Assessment  Intact;Edema   margins are "puffy"   Wound Length (cm)  2.1 cm   0.6 is portion of "land bridge" (almost approximated fully)   Wound Width (cm)  0.6 cm   0.7   Wound Depth (cm)  0.2 cm   0.2   Wound Volume (cm^3)  0.25 cm^3    Wound Surface Area (cm^2)  1.26 cm^2    Tunneling (cm)  small pockets  remain scattered around periwound along medial boarder (3) and (1) along lateral boarder    Margins  Attached edges (approximated)    Drainage Amount  Minimal    Drainage Description  Serosanguineous    Treatment  Cleansed;Debridement (Selective)    Selective Debridement - Location  wound bed, perimeter    Selective Debridement - Tools Used  Forceps;Scissors    Selective Debridement - Tissue Removed  slough and devitalized tissues     Wound Therapy - Clinical Statement  Patient's wound continues to heal slowly. Slight redness around perimeter and "puffy" margins but no other signs of infection. Patient continues to have small pockets in periwound with purulent drainage coming out of them when light pressure applied around pocket. Continued with debridement to open pockets and to maintain open edges. Applied xeroform and 2x2 for dressing. She will continue to benefit from skilled wound care to promote healing.     Hydrotherapy Plan  Debridement;Dressing change;Patient/family education;Pulsatile lavage with suction    Wound Therapy - Frequency  3X / week   4 additional weeks   Wound Therapy - Current Recommendations  PT    Wound Plan  continue with wound care and appropriate dressings. Measure on Mondays.    Dressing   xeroform, 2x2, medipore tape          PT Short Term Goals - 09/04/18 1212      PT SHORT TERM GOAL #1   Title  PT wound to be 100% granulated to decrease cellulitis    Time  2    Period  Weeks    Status  Partially Met    Target Date  08/17/18      PT SHORT TERM GOAL #2   Title  Pt pain to be no greater than a 4/10 to allow pt to be able to tolerate standing/walking for up  to 30 minutes without increased pain for improved functional ability.     Time  2    Period  Weeks    Status  Achieved        PT Long Term Goals - 09/04/18 1212      PT LONG TERM GOAL #1   Title  Wound size to be decreased to no greater than 1.5x1.5 cm to allow pt to feel comfortable to complete self care for wound.     Time  4    Period  Weeks    Status  On-going      PT LONG TERM GOAL #2   Title  Pt to be able to vocalize the benefits of wearing compression garments for varicosities.     Time  4    Period  Weeks    Status  On-going         Plan - 11/10/18 1206    Clinical Impression Statement  see above    Rehab Potential  Good    PT Frequency  3x / week    PT Duration  4 weeks   4 more   PT Treatment/Interventions  ADLs/Self Care Home Management;Patient/family education;Other (comment)   debridement and dressing change    PT Next Visit Plan  see above    Consulted and Agree with Plan of Care  Patient       Patient will benefit from skilled therapeutic intervention in order to improve the following deficits and impairments:  Decreased skin integrity, Pain  Visit Diagnosis: Open leg wound, right, sequela  Pain in right lower leg     Problem List  Patient Active Problem  List   Diagnosis Date Noted  . Rash and nonspecific skin eruption 04/06/2015  . Acute blood loss anemia 03/22/2015  . Edema 03/21/2015  . Acute renal failure syndrome ([place])   . Closed right hip fracture ([place]) 03/14/2015  . Rheumatoid arthritis ([place]) 03/14/2015  . Fall 03/14/2015    Kipp Brood, PT, DPT, [facility] Physical Therapist with [facility]  11/10/2018 12:06 PM    [place], [name], [address] Phone: [phone]   Fax:  [phone]  Name: Michaela Simpson MRN: [id number] Date of Birth: 1931-08-17

## 2018-11-14 ENCOUNTER — Ambulatory Visit (HOSPITAL_COMMUNITY): Payer: Medicare Other

## 2018-11-14 ENCOUNTER — Encounter (HOSPITAL_COMMUNITY): Payer: Self-pay

## 2018-11-14 ENCOUNTER — Other Ambulatory Visit: Payer: Self-pay

## 2018-11-14 DIAGNOSIS — M79661 Pain in right lower leg: Secondary | ICD-10-CM

## 2018-11-14 DIAGNOSIS — S81801S Unspecified open wound, right lower leg, sequela: Secondary | ICD-10-CM

## 2018-11-14 NOTE — Therapy (Signed)
[place], [name], [address] Phone: [phone]   Fax:  [phone]  Wound Care Therapy  Patient Details  Name: Michaela Simpson MRN: [id number] Date of Birth: 1931/09/01 Referring Provider (PT): Dr. Allyn Kenner   Encounter Date: 11/14/2018  PT End of Session - 11/14/18 0942    Visit Number  35   progress note completed visit #24   Number of Visits  46    Date for PT Re-Evaluation  11/27/18    Authorization Type  Medicare Part A & B    Authorization Time Period  08/03/18- 09/02/18; 09/04/2018 - 10/02/2018; 09/25/18-10/16/18; 10/16/18-10/30/18; new 10/30/18-11/27/18    Authorization - Visit Number  7    Authorization - Number of Visits  10    PT Start Time  0906    PT Stop Time  0936    PT Time Calculation (min)  30 min    Activity Tolerance  Patient tolerated treatment well    Behavior During Therapy  [facility] for tasks assessed/performed       Past Medical History:  Diagnosis Date  . Osteoporosis   . Rheumatoid arthritis [facility])     Past Surgical History:  Procedure Laterality Date  . INTRAMEDULLARY (IM) NAIL INTERTROCHANTERIC Right 03/14/2015   Procedure: RIGHT INTERTROCHANTRIC INTRAMEDULLARY (IM) NAIL ;  Surgeon: Mcarthur Rossetti, MD;  Location: [place];  Service: Orthopedics;  Laterality: Right;    There were no vitals filed for this visit.   Wound Therapy - 11/14/18 0941    Subjective  Patient reports she got her dressing wet when showering on Saturday this weekend. She reports she removed teh tape and gauze and re-wrapped it with gauze wrap. The xeroform strip was still in place.    Patient and Family Stated Goals  wound to heal     Date of Onset  05/05/18    Prior Treatments  self care, antibiotics     Pain Scale  0-10    Pain Score  2     Pain Type  Chronic pain    Pain Location  Leg    Pain Orientation  Right;Anterior;Lower    Pain Onset  Gradual    Patients Stated Pain Goal  0    Pain  Intervention(s)  Distraction    Multiple Pain Sites  No    Evaluation and Treatment Procedures Explained to Patient/Family  Yes    Evaluation and Treatment Procedures  agreed to    Wound Properties Date First Assessed: 08/03/18 Time First Assessed: 0949 Wound Type: Puncture Location: Leg Location Orientation: Right Present on Admission: Yes   Dressing Type  Gauze (Comment);Silver hydrofiber;Impregnated gauze (bismuth)   xeroform, alginate, petroleum around margins   Dressing Changed  Changed    Dressing Status  Old drainage    Dressing Change Frequency  PRN    Site / Wound Assessment  Granulation tissue    % Wound base Red or Granulating  95%   100% bright pink   % Wound base Yellow/Fibrinous Exudate  5%    % Wound base Black/Eschar  0%    Peri-wound Assessment  Intact;Edema   margins are "puffy"   Margins  Attached edges (approximated)    Drainage Amount  Minimal    Drainage Description  Serosanguineous    Treatment  Cleansed;Debridement (Selective);Hydrotherapy (Pulse lavage)    Pulsed lavage therapy - wound location  anteriot Rt leg wound    Pulsed Lavage with Suction (psi)  5 psi  Pulsed Lavage with Suction - Normal Saline Used  500 mL    Pulsed Lavage Tip  Tip with splash shield    Selective Debridement - Location  wound bed, perimeter    Selective Debridement - Tools Used  Forceps;Scissors    Selective Debridement - Tissue Removed  slough and devitalized tissues     Wound Therapy - Clinical Statement  Patient's wound bed remains red/pink in color with no obvious change in size this date. The bridge of skin in the middle of her wound is slightly macerated. Margins around skin bridge are epiboled and pt is sensitive to debridement. Slough and dried exudated removed with selective debridement. Pulsed lavage performed today as patient's wound continues to have small pockets around margins and slough under skin bridge that patient was unable to tolerate selective debridement. Continued  with xeroform and 2x2 followed by tape for dressing. Patient will continue to benefit from skilled wound care to promote healing.    Hydrotherapy Plan  Debridement;Dressing change;Patient/family education;Pulsatile lavage with suction    Wound Therapy - Frequency  3X / week   4 additional weeks   Wound Therapy - Current Recommendations  PT    Wound Plan  continue with wound care and appropriate dressings. Measure on Mondays.    Dressing   xeroform, 2x2, medipore tape        PT Short Term Goals - 09/04/18 1212      PT SHORT TERM GOAL #1   Title  PT wound to be 100% granulated to decrease cellulitis    Time  2    Period  Weeks    Status  Partially Met    Target Date  08/17/18      PT SHORT TERM GOAL #2   Title  Pt pain to be no greater than a 4/10 to allow pt to be able to tolerate standing/walking for up  to 30 minutes without increased pain for improved functional ability.     Time  2    Period  Weeks    Status  Achieved        PT Long Term Goals - 09/04/18 1212      PT LONG TERM GOAL #1   Title  Wound size to be decreased to no greater than 1.5x1.5 cm to allow pt to feel comfortable to complete self care for wound.     Time  4    Period  Weeks    Status  On-going      PT LONG TERM GOAL #2   Title  Pt to be able to vocalize the benefits of wearing compression garments for varicosities.     Time  4    Period  Weeks    Status  On-going         Plan - 11/14/18 8916    Clinical Impression Statement  see above    Rehab Potential  Good    PT Frequency  3x / week    PT Duration  4 weeks   4 more   PT Treatment/Interventions  ADLs/Self Care Home Management;Patient/family education;Other (comment)   debridement and dressing change    PT Next Visit Plan  see above    Consulted and Agree with Plan of Care  Patient       Patient will benefit from skilled therapeutic intervention in order to improve the following deficits and impairments:  Decreased skin integrity,  Pain  Visit Diagnosis: Open leg wound, right, sequela  Pain in right lower  leg     Problem List Patient Active Problem List   Diagnosis Date Noted  . Rash and nonspecific skin eruption 04/06/2015  . Acute blood loss anemia 03/22/2015  . Edema 03/21/2015  . Acute renal failure syndrome ([place])   . Closed right hip fracture ([place]) 03/14/2015  . Rheumatoid arthritis ([place]) 03/14/2015  . Fall 03/14/2015    Kipp Brood, PT, DPT, [facility] Physical Therapist with [facility]  11/14/2018 9:50 AM    [place], [name], [address] Phone: [phone]   Fax:  [phone]  Name: KIMIE PIDCOCK MRN: [id number] Date of Birth: [birth year]

## 2018-11-17 ENCOUNTER — Other Ambulatory Visit: Payer: Self-pay

## 2018-11-17 ENCOUNTER — Ambulatory Visit (HOSPITAL_COMMUNITY): Payer: Medicare Other | Admitting: Physical Therapy

## 2018-11-17 DIAGNOSIS — S81801S Unspecified open wound, right lower leg, sequela: Secondary | ICD-10-CM

## 2018-11-17 DIAGNOSIS — M79661 Pain in right lower leg: Secondary | ICD-10-CM | POA: Diagnosis not present

## 2018-11-17 NOTE — Therapy (Signed)
[place], [name], [address] Phone: [phone]   Fax:  [phone]  Wound Care Therapy  Patient Details  Name: Michaela Simpson MRN: [id number] Date of Birth: 1932-06-11 Referring Provider (PT): Dr. Allyn Kenner   Encounter Date: 11/17/2018  PT End of Session - 11/17/18 1342    Visit Number  36   progress note completed visit #24   Number of Visits  46    Date for PT Re-Evaluation  11/27/18    Authorization Type  Medicare Part A & B    Authorization Time Period  08/03/18- 09/02/18; 09/04/2018 - 10/02/2018; 09/25/18-10/16/18; 10/16/18-10/30/18; new 10/30/18-11/27/18    Authorization - Visit Number  8    Authorization - Number of Visits  10    PT Start Time  1497    PT Stop Time  1325    PT Time Calculation (min)  20 min    Activity Tolerance  Patient tolerated treatment well    Behavior During Therapy  [facility] for tasks assessed/performed       Past Medical History:  Diagnosis Date  . Osteoporosis   . Rheumatoid arthritis [facility])     Past Surgical History:  Procedure Laterality Date  . INTRAMEDULLARY (IM) NAIL INTERTROCHANTERIC Right 03/14/2015   Procedure: RIGHT INTERTROCHANTRIC INTRAMEDULLARY (IM) NAIL ;  Surgeon: Mcarthur Rossetti, MD;  Location: [place];  Service: Orthopedics;  Laterality: Right;    There were no vitals filed for this visit.              Wound Therapy - 11/17/18 1323    Subjective  no issues today.     Patient and Family Stated Goals  wound to heal     Date of Onset  05/05/18    Prior Treatments  self care, antibiotics     Pain Scale  0-10    Pain Score  0-No pain    Evaluation and Treatment Procedures Explained to Patient/Family  Yes    Evaluation and Treatment Procedures  agreed to    Wound Properties Date First Assessed: 08/03/18 Time First Assessed: 0949 Wound Type: Puncture Location: Leg Location Orientation: Right Present on Admission: Yes   Dressing Type  Gauze  (Comment);Silver hydrofiber;Impregnated gauze (bismuth)   xeroform, alginate, petroleum around margins   Dressing Changed  Changed    Dressing Status  Old drainage    Dressing Change Frequency  PRN    Site / Wound Assessment  Granulation tissue    % Wound base Red or Granulating  95%   100% bright pink   % Wound base Yellow/Fibrinous Exudate  5%    % Wound base Black/Eschar  0%    Peri-wound Assessment  Intact;Edema   margins are "puffy"   Margins  Attached edges (approximated)    Drainage Amount  Minimal    Drainage Description  Serosanguineous    Treatment  Cleansed;Debridement (Selective)    Pulsed lavage therapy - wound location  --    Pulsed Lavage with Suction (psi)  --    Pulsed Lavage with Suction - Normal Saline Used  --    Pulsed Lavage Tip  --    Selective Debridement - Location  wound bed, perimeter    Selective Debridement - Tools Used  Forceps;Scissors    Selective Debridement - Tissue Removed  slough and devitalized tissues     Wound Therapy - Clinical Statement  wound bed on dry side today, however improving with increased approximation.  Debrided  away all dry skin perimeter of wound to promote healing  [place] area appears to be healed all the way across wound dividing into 2 sections with most proximal end hearly healed completly.     Hydrotherapy Plan  Debridement;Dressing change;Patient/family education;Pulsatile lavage with suction    Wound Therapy - Frequency  3X / week   4 additional weeks   Wound Therapy - Current Recommendations  PT    Wound Plan  continue with wound care and appropriate dressings. Measure on Mondays.    Dressing   xeroform, 2x2, medipore tape                PT Short Term Goals - 09/04/18 1212      PT SHORT TERM GOAL #1   Title  PT wound to be 100% granulated to decrease cellulitis    Time  2    Period  Weeks    Status  Partially Met    Target Date  08/17/18      PT SHORT TERM GOAL #2   Title  Pt pain to be no greater than a  4/10 to allow pt to be able to tolerate standing/walking for up  to 30 minutes without increased pain for improved functional ability.     Time  2    Period  Weeks    Status  Achieved        PT Long Term Goals - 09/04/18 1212      PT LONG TERM GOAL #1   Title  Wound size to be decreased to no greater than 1.5x1.5 cm to allow pt to feel comfortable to complete self care for wound.     Time  4    Period  Weeks    Status  On-going      PT LONG TERM GOAL #2   Title  Pt to be able to vocalize the benefits of wearing compression garments for varicosities.     Time  4    Period  Weeks    Status  On-going              Patient will benefit from skilled therapeutic intervention in order to improve the following deficits and impairments:     Visit Diagnosis: Open leg wound, right, sequela     Problem List Patient Active Problem List   Diagnosis Date Noted  . Rash and nonspecific skin eruption 04/06/2015  . Acute blood loss anemia 03/22/2015  . Edema 03/21/2015  . Acute renal failure syndrome ([place])   . Closed right hip fracture ([place]) 03/14/2015  . Rheumatoid arthritis ([place]) 03/14/2015  . Fall 03/14/2015   Teena Irani, PTA/CLT [phone]  Teena Irani 11/17/2018, 1:43 PM  [place] [address] [place], [name], [address] Phone: [phone]   Fax:  [phone]  Name: Michaela Simpson MRN: [id number] Date of Birth: 1931-08-23

## 2018-11-20 ENCOUNTER — Telehealth (HOSPITAL_COMMUNITY): Payer: Self-pay | Admitting: Internal Medicine

## 2018-11-20 ENCOUNTER — Other Ambulatory Visit: Payer: Self-pay

## 2018-11-20 ENCOUNTER — Ambulatory Visit (HOSPITAL_COMMUNITY): Payer: Medicare Other | Attending: Internal Medicine | Admitting: Physical Therapy

## 2018-11-20 DIAGNOSIS — M79661 Pain in right lower leg: Secondary | ICD-10-CM

## 2018-11-20 DIAGNOSIS — S81801S Unspecified open wound, right lower leg, sequela: Secondary | ICD-10-CM | POA: Diagnosis not present

## 2018-11-20 NOTE — Therapy (Signed)
[place], [name], [address] Phone: [phone]   Fax:  [phone]  Wound Care Therapy  Patient Details  Name: Michaela Simpson MRN: [id number] Date of Birth: May 04, 1932 Referring Provider (PT): Dr. Allyn Kenner   Encounter Date: 11/20/2018  PT End of Session - 11/20/18 1157    Visit Number  37   progress note completed visit #29   Number of Visits  39    Date for PT Re-Evaluation  11/27/18    Authorization Type  Medicare Part A & B    Authorization Time Period  08/03/18- 09/02/18; 09/04/2018 - 10/02/2018; 09/25/18-10/16/18; 10/16/18-10/30/18; new 10/30/18-11/27/18    Authorization - Visit Number  9    Authorization - Number of Visits  10    Activity Tolerance  Patient tolerated treatment well    Behavior During Therapy  [facility] for tasks assessed/performed       Past Medical History:  Diagnosis Date  . Osteoporosis   . Rheumatoid arthritis [facility])     Past Surgical History:  Procedure Laterality Date  . INTRAMEDULLARY (IM) NAIL INTERTROCHANTERIC Right 03/14/2015   Procedure: RIGHT INTERTROCHANTRIC INTRAMEDULLARY (IM) NAIL ;  Surgeon: Mcarthur Rossetti, MD;  Location: [place];  Service: Orthopedics;  Laterality: Right;    There were no vitals filed for this visit.              Wound Therapy - 11/20/18 1154    Subjective  pt states she is doing well and keeping her dressing intact.     Patient and Family Stated Goals  wound to heal     Date of Onset  05/05/18    Prior Treatments  self care, antibiotics     Pain Scale  0-10    Pain Score  0-No pain    Evaluation and Treatment Procedures Explained to Patient/Family  Yes    Evaluation and Treatment Procedures  agreed to    Wound Properties Date First Assessed: 08/03/18 Time First Assessed: 0949 Wound Type: Puncture Location: Leg Location Orientation: Right Present on Admission: Yes   Dressing Type  Gauze (Comment);Silver hydrofiber;Impregnated gauze  (bismuth)   xeroform, alginate, petroleum around margins   Dressing Changed  Changed    Dressing Status  Old drainage    Dressing Change Frequency  PRN    Site / Wound Assessment  Granulation tissue    % Wound base Red or Granulating  95%   100% bright pink   % Wound base Yellow/Fibrinous Exudate  5%    % Wound base Black/Eschar  0%    Peri-wound Assessment  Intact;Edema   margins are "puffy"   Wound Length (cm)  1.2 cm    Wound Width (cm)  0.6 cm    Wound Depth (cm)  0.1 cm    Wound Volume (cm^3)  0.07 cm^3    Wound Surface Area (cm^2)  0.72 cm^2    Margins  Attached edges (approximated)    Drainage Amount  Minimal    Drainage Description  Serosanguineous    Treatment  Cleansed;Debridement (Selective)    Selective Debridement - Location  wound bed, perimeter    Selective Debridement - Tools Used  Forceps;Scissors    Selective Debridement - Tissue Removed  slough and devitalized tissues     Wound Therapy - Clinical Statement  top portion of wound now healed above island of skin with full approximation across woundbed.  Debrided edeges of dry skin and devitalized tissue to promote further  approximation.  Pt without any complaints of pain.  Continued to dress with xeroform.      Hydrotherapy Plan  Debridement;Dressing change;Patient/family education;Pulsatile lavage with suction    Wound Therapy - Frequency  3X / week   4 additional weeks   Wound Therapy - Current Recommendations  PT    Wound Plan  continue with wound care and appropriate dressings. Measure on Mondays.    Dressing   xeroform, 2x2, medipore tape                PT Short Term Goals - 09/04/18 1212      PT SHORT TERM GOAL #1   Title  PT wound to be 100% granulated to decrease cellulitis    Time  2    Period  Weeks    Status  Partially Met    Target Date  08/17/18      PT SHORT TERM GOAL #2   Title  Pt pain to be no greater than a 4/10 to allow pt to be able to tolerate standing/walking for up  to 30  minutes without increased pain for improved functional ability.     Time  2    Period  Weeks    Status  Achieved        PT Long Term Goals - 09/04/18 1212      PT LONG TERM GOAL #1   Title  Wound size to be decreased to no greater than 1.5x1.5 cm to allow pt to feel comfortable to complete self care for wound.     Time  4    Period  Weeks    Status  On-going      PT LONG TERM GOAL #2   Title  Pt to be able to vocalize the benefits of wearing compression garments for varicosities.     Time  4    Period  Weeks    Status  On-going              Patient will benefit from skilled therapeutic intervention in order to improve the following deficits and impairments:     Visit Diagnosis: Pain in right lower leg  Open leg wound, right, sequela     Problem List Patient Active Problem List   Diagnosis Date Noted  . Rash and nonspecific skin eruption 04/06/2015  . Acute blood loss anemia 03/22/2015  . Edema 03/21/2015  . Acute renal failure syndrome ([place])   . Closed right hip fracture ([place]) 03/14/2015  . Rheumatoid arthritis ([place]) 03/14/2015  . Fall 03/14/2015   Teena Irani, PTA/CLT [phone]  Teena Irani 11/20/2018, 12:07 PM  [place] [address] [place], [name], [address] Phone: [phone]   Fax:  [phone]  Name: Michaela Simpson MRN: [id number] Date of Birth: August 15, 1931

## 2018-11-20 NOTE — Telephone Encounter (Signed)
11/20/18  pt stopped front desk after appt and said to cx this appt and she would only need to come on Friday... the Wed. 6/3 appt is what she was referring to

## 2018-11-22 ENCOUNTER — Ambulatory Visit (HOSPITAL_COMMUNITY): Payer: Medicare Other

## 2018-11-23 ENCOUNTER — Telehealth (HOSPITAL_COMMUNITY): Payer: Self-pay | Admitting: Internal Medicine

## 2018-11-23 NOTE — Telephone Encounter (Signed)
6/4 left patient a message to see if she could come in at 9:30 instead of 9:15

## 2018-11-24 ENCOUNTER — Ambulatory Visit (HOSPITAL_COMMUNITY): Payer: Medicare Other | Admitting: Physical Therapy

## 2018-11-24 ENCOUNTER — Other Ambulatory Visit: Payer: Self-pay

## 2018-11-24 DIAGNOSIS — S81801S Unspecified open wound, right lower leg, sequela: Secondary | ICD-10-CM

## 2018-11-24 DIAGNOSIS — M79661 Pain in right lower leg: Secondary | ICD-10-CM | POA: Diagnosis not present

## 2018-11-24 NOTE — Therapy (Signed)
[facility] [address] [place], [name], [address] Phone: [phone]   Fax:  [phone]  Wound Care Therapy Progress Note Reporting Period 11/01/2018  to 11/24/2018  See note below for Objective Data and Assessment of Progress/Goals.   I agree with the below findings/measures and clinical impression by Roseanne Reno, PTA, CLT. Patient's wound is improving at gradual rate. Patient will benefit from continued wound care and is now able to reduce frequency to 2x/week for additional 4 weeks.   Kipp Brood, PT, DPT, [facility] Physical Therapist with [facility]  11/24/2018 5:24 PM   Patient Details  Name: Michaela Simpson MRN: [id number] Date of Birth: [birth year] Referring Provider (PT): Dr. Allyn Kenner   Encounter Date: 11/24/2018  PT End of Session - 11/24/18 1055    Visit Number  9   progress note completed visit #38   Number of Visits  47    Date for PT Re-Evaluation  12/22/18    Authorization Type  Medicare Part A & B    Authorization Time Period  08/03/18- 09/02/18; 09/04/2018 - 10/02/2018; 09/25/18-10/16/18; 10/16/18-10/30/18; 10/30/18-11/27/18; NEW: 11/24/2018-12/22/2018    Authorization - Visit Number  1    Authorization - Number of Visits  10    PT Start Time  0930    PT Stop Time  1000    PT Time Calculation (min)  30 min    Activity Tolerance  Patient tolerated treatment well    Behavior During Therapy  [facility] for tasks assessed/performed       Past Medical History:  Diagnosis Date  . Osteoporosis   . Rheumatoid arthritis [facility])     Past Surgical History:  Procedure Laterality Date  . INTRAMEDULLARY (IM) NAIL INTERTROCHANTERIC Right 03/14/2015   Procedure: RIGHT INTERTROCHANTRIC INTRAMEDULLARY (IM) NAIL ;  Surgeon: Mcarthur Rossetti, MD;  Location: [place];  Service: Orthopedics;  Laterality: Right;    There were no vitals filed for this visit.              Wound Therapy - 11/24/18 1053     Subjective  no issues or complaints today.  dressing intact    Patient and Family Stated Goals  wound to heal     Date of Onset  05/05/18    Prior Treatments  self care, antibiotics     Pain Scale  0-10    Pain Score  0-No pain    Evaluation and Treatment Procedures Explained to Patient/Family  Yes    Evaluation and Treatment Procedures  agreed to    Wound Properties Date First Assessed: 08/03/18 Time First Assessed: 0949 Wound Type: Puncture Location: Leg Location Orientation: Right Present on Admission: Yes   Dressing Type  Gauze (Comment);Silver hydrofiber;Impregnated gauze (bismuth)   xeroform, alginate, petroleum around margins   Dressing Changed  Changed    Dressing Status  Old drainage    Dressing Change Frequency  PRN    Site / Wound Assessment  Granulation tissue    % Wound base Red or Granulating  95%   100% bright pink   % Wound base Yellow/Fibrinous Exudate  5%    % Wound base Black/Eschar  0%    Peri-wound Assessment  Intact;Edema   margins are "puffy"   Wound Length (cm)  1.2 cm    Wound Width (cm)  0.6 cm    Wound Depth (cm)  0.1 cm    Wound Volume (cm^3)  0.07 cm^3    Wound Surface  Area (cm^2)  0.72 cm^2    Margins  Attached edges (approximated)    Drainage Amount  Minimal    Drainage Description  Serosanguineous    Treatment  Cleansed;Debridement (Selective)    Selective Debridement - Location  wound bed, perimeter    Selective Debridement - Tools Used  Forceps;Scissors    Selective Debridement - Tissue Removed  slough and devitalized tissues     Wound Therapy - Clinical Statement  continued improvment and approximation of borders with greatest measurements remaining the same.  Able to debride dry tissue and necrosis from perimeter and slough within woundbed.  pt tolerated well.  contineud with xerofom dressing.     Hydrotherapy Plan  Debridement;Dressing change;Patient/family education;Pulsatile lavage with suction    Wound Therapy - Frequency  3X / week   4  additional weeks   Wound Therapy - Current Recommendations  PT    Wound Plan  continue with wound care and appropriate dressings. Measure on Mondays.    Dressing   xeroform, 2x2, medipore tape                PT Short Term Goals - 11/24/18 1057      PT SHORT TERM GOAL #1   Title  PT wound to be 100% granulated to decrease cellulitis    Time  2    Period  Weeks    Status  On-going    Target Date  08/17/18      PT SHORT TERM GOAL #2   Title  Pt pain to be no greater than a 4/10 to allow pt to be able to tolerate standing/walking for up  to 30 minutes without increased pain for improved functional ability.     Time  2    Period  Weeks    Status  Achieved        PT Long Term Goals - 11/24/18 1057      PT LONG TERM GOAL #1   Title  Wound size to be decreased to no greater than 1.5x1.5 cm to allow pt to feel comfortable to complete self care for wound.     Time  4    Period  Weeks    Status  Achieved      PT LONG TERM GOAL #2   Title  Pt to be able to vocalize the benefits of wearing compression garments for varicosities.     Time  4    Period  Weeks    Status  On-going            Plan - 11/24/18 1721    Clinical Impression Statement  see above    Rehab Potential  Good    PT Frequency  2x / week    PT Duration  4 weeks   4 more   PT Treatment/Interventions  ADLs/Self Care Home Management;Patient/family education;Other (comment)   debridement and dressing change    PT Next Visit Plan  see above    Consulted and Agree with Plan of Care  Patient       Patient will benefit from skilled therapeutic intervention in order to improve the following deficits and impairments:  Decreased skin integrity, Pain  Visit Diagnosis: Pain in right lower leg  Open leg wound, right, sequela     Problem List Patient Active Problem List   Diagnosis Date Noted  . Rash and nonspecific skin eruption 04/06/2015  . Acute blood loss anemia 03/22/2015  . Edema 03/21/2015   . Acute renal  failure syndrome ([place])   . Closed right hip fracture ([place]) 03/14/2015  . Rheumatoid arthritis ([place]) 03/14/2015  . Fall 03/14/2015   Teena Irani, PTA/CLT [phone]  Jacques Navy 11/24/2018, 10:57 AM  [facility] [address] [place], [name], [address] Phone: [phone]   Fax:  [phone]  Name: Michaela Simpson MRN: [id number] Date of Birth: 09/30/1931

## 2018-11-27 ENCOUNTER — Ambulatory Visit (HOSPITAL_COMMUNITY): Payer: Medicare Other | Admitting: Physical Therapy

## 2018-11-27 ENCOUNTER — Other Ambulatory Visit: Payer: Self-pay

## 2018-11-27 DIAGNOSIS — S81801S Unspecified open wound, right lower leg, sequela: Secondary | ICD-10-CM

## 2018-11-27 DIAGNOSIS — M79661 Pain in right lower leg: Secondary | ICD-10-CM | POA: Diagnosis not present

## 2018-11-27 NOTE — Therapy (Signed)
[place], [name], [address] Phone: [phone]   Fax:  [phone]  Wound Care Therapy  Patient Details  Name: Michaela Simpson MRN: [id number] Date of Birth: 09-16-1931 Referring Provider (PT): Dr. Allyn Kenner   Encounter Date: 11/27/2018  PT End of Session - 11/27/18 1113    Visit Number  39   progress note completed visit #38   Number of Visits  47    Date for PT Re-Evaluation  12/22/18    Authorization Type  Medicare Part A & B    Authorization Time Period  08/03/18- 09/02/18; 09/04/2018 - 10/02/2018; 09/25/18-10/16/18; 10/16/18-10/30/18; 10/30/18-11/27/18; NEW: 11/24/2018-12/22/2018    Authorization - Visit Number  2    Authorization - Number of Visits  10    PT Start Time  1030    PT Stop Time  1100    PT Time Calculation (min)  30 min    Activity Tolerance  Patient tolerated treatment well    Behavior During Therapy  [facility] for tasks assessed/performed       Past Medical History:  Diagnosis Date  . Osteoporosis   . Rheumatoid arthritis [facility])     Past Surgical History:  Procedure Laterality Date  . INTRAMEDULLARY (IM) NAIL INTERTROCHANTERIC Right 03/14/2015   Procedure: RIGHT INTERTROCHANTRIC INTRAMEDULLARY (IM) NAIL ;  Surgeon: Mcarthur Rossetti, MD;  Location: [place];  Service: Orthopedics;  Laterality: Right;    There were no vitals filed for this visit.              Wound Therapy - 11/27/18 1111    Subjective  minimal drainage, no pain today    Patient and Family Stated Goals  wound to heal     Date of Onset  05/05/18    Prior Treatments  self care, antibiotics     Pain Scale  0-10    Pain Score  0-No pain    Evaluation and Treatment Procedures Explained to Patient/Family  Yes    Evaluation and Treatment Procedures  agreed to    Wound Properties Date First Assessed: 08/03/18 Time First Assessed: 0949 Wound Type: Puncture Location: Leg Location Orientation: Right Present on Admission: Yes    Dressing Type  Gauze (Comment);Silver hydrofiber;Impregnated gauze (bismuth)   xeroform, alginate, petroleum around margins   Dressing Changed  Changed    Dressing Status  Old drainage    Dressing Change Frequency  PRN    Site / Wound Assessment  Granulation tissue    % Wound base Red or Granulating  95%   100% bright pink   % Wound base Yellow/Fibrinous Exudate  5%    % Wound base Black/Eschar  0%    Peri-wound Assessment  Intact;Edema   margins are "puffy"   Margins  Attached edges (approximated)    Drainage Amount  Minimal    Drainage Description  Serosanguineous    Treatment  Cleansed;Debridement (Selective)    Selective Debridement - Location  wound bed, perimeter    Selective Debridement - Tools Used  Forceps    Selective Debridement - Tissue Removed  slough and devitalized tissues     Wound Therapy - Clinical Statement  wound with increased approximation this session.  Able to remove dry tissuer periemter of wound to promote further approximation.  Anticipate faster healing in the next week.      Hydrotherapy Plan  Debridement;Dressing change;Patient/family education;Pulsatile lavage with suction    Wound Therapy - Frequency  3X / week  4 additional weeks   Wound Therapy - Current Recommendations  PT    Wound Plan  continue with wound care and appropriate dressings. Measure next session (Friday).    Dressing   xeroform, 2x2, medipore tape                PT Short Term Goals - 11/24/18 1057      PT SHORT TERM GOAL #1   Title  PT wound to be 100% granulated to decrease cellulitis    Time  2    Period  Weeks    Status  On-going    Target Date  08/17/18      PT SHORT TERM GOAL #2   Title  Pt pain to be no greater than a 4/10 to allow pt to be able to tolerate standing/walking for up  to 30 minutes without increased pain for improved functional ability.     Time  2    Period  Weeks    Status  Achieved        PT Long Term Goals - 11/24/18 1057      PT  LONG TERM GOAL #1   Title  Wound size to be decreased to no greater than 1.5x1.5 cm to allow pt to feel comfortable to complete self care for wound.     Time  4    Period  Weeks    Status  Achieved      PT LONG TERM GOAL #2   Title  Pt to be able to vocalize the benefits of wearing compression garments for varicosities.     Time  4    Period  Weeks    Status  On-going              Patient will benefit from skilled therapeutic intervention in order to improve the following deficits and impairments:     Visit Diagnosis: Pain in right lower leg  Open leg wound, right, sequela     Problem List Patient Active Problem List   Diagnosis Date Noted  . Rash and nonspecific skin eruption 04/06/2015  . Acute blood loss anemia 03/22/2015  . Edema 03/21/2015  . Acute renal failure syndrome ([place])   . Closed right hip fracture ([place]) 03/14/2015  . Rheumatoid arthritis ([place]) 03/14/2015  . Fall 03/14/2015   Teena Irani, PTA/CLT [phone]  Teena Irani 11/27/2018, [place], [name], [address] Phone: [phone]   Fax:  [phone]  Name: Michaela Simpson MRN: [id number] Date of Birth: [birth year]

## 2018-11-28 DIAGNOSIS — E782 Mixed hyperlipidemia: Secondary | ICD-10-CM | POA: Diagnosis not present

## 2018-11-28 DIAGNOSIS — N183 Chronic kidney disease, stage 3 (moderate): Secondary | ICD-10-CM | POA: Diagnosis not present

## 2018-11-28 DIAGNOSIS — I10 Essential (primary) hypertension: Secondary | ICD-10-CM | POA: Diagnosis not present

## 2018-11-28 DIAGNOSIS — R944 Abnormal results of kidney function studies: Secondary | ICD-10-CM | POA: Diagnosis not present

## 2018-11-29 ENCOUNTER — Ambulatory Visit (HOSPITAL_COMMUNITY): Payer: Medicare Other

## 2018-12-01 ENCOUNTER — Encounter (HOSPITAL_COMMUNITY): Payer: Self-pay

## 2018-12-01 ENCOUNTER — Other Ambulatory Visit: Payer: Self-pay

## 2018-12-01 ENCOUNTER — Ambulatory Visit (HOSPITAL_COMMUNITY): Payer: Medicare Other

## 2018-12-01 DIAGNOSIS — S81801S Unspecified open wound, right lower leg, sequela: Secondary | ICD-10-CM | POA: Diagnosis not present

## 2018-12-01 DIAGNOSIS — M79661 Pain in right lower leg: Secondary | ICD-10-CM

## 2018-12-01 NOTE — Therapy (Signed)
[place], [name], [address] Phone: [phone]   Fax:  [phone]  Wound Care Therapy  Patient Details  Name: Michaela Simpson MRN: [id number] Date of Birth: 05-10-1932 Referring Provider (PT): Dr. Allyn Kenner   Encounter Date: 12/01/2018  PT End of Session - 12/01/18 1147    Visit Number  40   progress note completed visit #38   Number of Visits  47    Date for PT Re-Evaluation  12/22/18    Authorization Type  Medicare Part A & B    Authorization Time Period  08/03/18- 09/02/18; 09/04/2018 - 10/02/2018; 09/25/18-10/16/18; 10/16/18-10/30/18; 10/30/18-11/27/18; NEW: 11/24/2018-12/22/2018    Authorization - Visit Number  3    Authorization - Number of Visits  10    PT Start Time  1119    PT Stop Time  1137    PT Time Calculation (min)  18 min    Activity Tolerance  Patient tolerated treatment well    Behavior During Therapy  [facility] for tasks assessed/performed       Past Medical History:  Diagnosis Date  . Osteoporosis   . Rheumatoid arthritis [facility])     Past Surgical History:  Procedure Laterality Date  . INTRAMEDULLARY (IM) NAIL INTERTROCHANTERIC Right 03/14/2015   Procedure: RIGHT INTERTROCHANTRIC INTRAMEDULLARY (IM) NAIL ;  Surgeon: Mcarthur Rossetti, MD;  Location: [place];  Service: Orthopedics;  Laterality: Right;    There were no vitals filed for this visit.    Wound Therapy - 12/01/18 1148    Subjective  Patient reports she got her dressing wet showering and had to redress it herself. At first she used a band-aid which irritated it so then she applied gauze and gauze wrap to it which felt fine.     Patient and Family Stated Goals  wound to heal     Date of Onset  05/05/18    Prior Treatments  self care, antibiotics     Pain Scale  0-10    Pain Score  0-No pain    Evaluation and Treatment Procedures Explained to Patient/Family  Yes    Evaluation and Treatment Procedures  agreed to    Wound Properties  Date First Assessed: 08/03/18 Time First Assessed: 0949 Wound Type: Puncture Location: Leg Location Orientation: Right Present on Admission: Yes   Dressing Type  Gauze (Comment);Silver hydrofiber;Impregnated gauze (bismuth)   xeroform, alginate, petroleum around margins   Dressing Changed  Changed    Dressing Status  Old drainage    Dressing Change Frequency  PRN    Site / Wound Assessment  Granulation tissue    % Wound base Red or Granulating  95%   100% bright pink   % Wound base Yellow/Fibrinous Exudate  5%    % Wound base Black/Eschar  0%    Peri-wound Assessment  Intact;Edema   margins are "puffy"   Wound Length (cm)  1.2 cm    Wound Width (cm)  0.3 cm    Wound Depth (cm)  0.1 cm    Wound Volume (cm^3)  0.04 cm^3    Wound Surface Area (cm^2)  0.36 cm^2    Margins  Attached edges (approximated)    Drainage Amount  Minimal    Drainage Description  Serosanguineous    Treatment  Cleansed;Debridement (Selective)    Selective Debridement - Location  wound bed, perimeter    Selective Debridement - Tools Used  Forceps    Selective Debridement - Tissue Removed  slough and devitalized tissues     Wound Therapy - Clinical Statement  Pt's wound presents with improved approximation from last week and was able to cleanse and debride dry tissue at margins to promote further approximation this session. Xeroform applied to wound bed and 2x2 followed by medipore tape.     Hydrotherapy Plan  Debridement;Dressing change;Patient/family education;Pulsatile lavage with suction    Wound Therapy - Frequency  2X / week   4 additional weeks   Wound Therapy - Current Recommendations  PT    Wound Plan  continue with wound care and appropriate dressings. Measure next session (Friday).    Dressing   xeroform, 2x2, medipore tape        PT Short Term Goals - 11/24/18 1057      PT SHORT TERM GOAL #1   Title  PT wound to be 100% granulated to decrease cellulitis    Time  2    Period  Weeks    Status   On-going    Target Date  08/17/18      PT SHORT TERM GOAL #2   Title  Pt pain to be no greater than a 4/10 to allow pt to be able to tolerate standing/walking for up  to 30 minutes without increased pain for improved functional ability.     Time  2    Period  Weeks    Status  Achieved        PT Long Term Goals - 11/24/18 1057      PT LONG TERM GOAL #1   Title  Wound size to be decreased to no greater than 1.5x1.5 cm to allow pt to feel comfortable to complete self care for wound.     Time  4    Period  Weeks    Status  Achieved      PT LONG TERM GOAL #2   Title  Pt to be able to vocalize the benefits of wearing compression garments for varicosities.     Time  4    Period  Weeks    Status  On-going       Plan - 12/01/18 1148    Clinical Impression Statement  see above    Rehab Potential  Good    PT Frequency  2x / week    PT Duration  4 weeks   4 more   PT Treatment/Interventions  ADLs/Self Care Home Management;Patient/family education;Other (comment)   debridement and dressing change    PT Next Visit Plan  see above    Consulted and Agree with Plan of Care  Patient       Patient will benefit from skilled therapeutic intervention in order to improve the following deficits and impairments:  Decreased skin integrity, Pain  Visit Diagnosis: Pain in right lower leg  Open leg wound, right, sequela     Problem List Patient Active Problem List   Diagnosis Date Noted  . Rash and nonspecific skin eruption 04/06/2015  . Acute blood loss anemia 03/22/2015  . Edema 03/21/2015  . Acute renal failure syndrome ([place])   . Closed right hip fracture ([place]) 03/14/2015  . Rheumatoid arthritis ([place]) 03/14/2015  . Fall 03/14/2015    Kipp Brood, PT, DPT, [facility] Physical Therapist with [facility]  12/01/2018 11:53 AM     [place] [address] [place], [name], [address] Phone: [phone]   Fax:   [phone]  Name: Michaela Simpson MRN: [id number]  Date of Birth: May 24, 1932

## 2018-12-04 ENCOUNTER — Ambulatory Visit (HOSPITAL_COMMUNITY): Payer: Medicare Other

## 2018-12-04 ENCOUNTER — Encounter (HOSPITAL_COMMUNITY): Payer: Self-pay

## 2018-12-04 ENCOUNTER — Other Ambulatory Visit: Payer: Self-pay

## 2018-12-04 DIAGNOSIS — M79661 Pain in right lower leg: Secondary | ICD-10-CM | POA: Diagnosis not present

## 2018-12-04 DIAGNOSIS — S81801S Unspecified open wound, right lower leg, sequela: Secondary | ICD-10-CM

## 2018-12-04 NOTE — Therapy (Signed)
[place], [name], [address] Phone: [phone]   Fax:  [phone]  Wound Care Therapy  Patient Details  Name: Michaela Simpson MRN: [id number] Date of Birth: 12/17/1931 Referring Provider (PT): Dr. Allyn Kenner   Encounter Date: 12/04/2018  PT End of Session - 12/04/18 1153    Visit Number  41   progress note completed visit #38   Number of Visits  47    Date for PT Re-Evaluation  12/22/18    Authorization Type  Medicare Part A & B    Authorization Time Period  08/03/18- 09/02/18; 09/04/2018 - 10/02/2018; 09/25/18-10/16/18; 10/16/18-10/30/18; 10/30/18-11/27/18; NEW: 11/24/2018-12/22/2018    Authorization - Visit Number  4    Authorization - Number of Visits  10    PT Start Time  1115    PT Stop Time  1138    PT Time Calculation (min)  23 min    Activity Tolerance  Patient tolerated treatment well    Behavior During Therapy  [facility] for tasks assessed/performed       Past Medical History:  Diagnosis Date  . Osteoporosis   . Rheumatoid arthritis [facility])     Past Surgical History:  Procedure Laterality Date  . INTRAMEDULLARY (IM) NAIL INTERTROCHANTERIC Right 03/14/2015   Procedure: RIGHT INTERTROCHANTRIC INTRAMEDULLARY (IM) NAIL ;  Surgeon: Mcarthur Rossetti, MD;  Location: [place];  Service: Orthopedics;  Laterality: Right;    There were no vitals filed for this visit.      Wound Therapy - 12/04/18 1148    Subjective  Patient reports she got her dressing wet showering and had to redress it herself. At first she used a band-aid which irritated it so then she applied gauze and gauze wrap to it which felt fine.     Patient and Family Stated Goals  wound to heal     Date of Onset  05/05/18    Prior Treatments  self care, antibiotics     Pain Scale  Faces    Pain Score  0-No pain   pt denied pain at start   Faces Pain Scale  Hurts whole lot   thorughout session pt reprted pain with debridement   Evaluation and  Treatment Procedures Explained to Patient/Family  Yes    Evaluation and Treatment Procedures  agreed to    Wound Properties Date First Assessed: 08/03/18 Time First Assessed: 0949 Wound Type: Puncture Location: Leg Location Orientation: Right Present on Admission: Yes   Dressing Type  Gauze (Comment);Silver hydrofiber;Impregnated gauze (bismuth)    Dressing Changed  Changed    Dressing Status  Old drainage    Dressing Change Frequency  PRN    Site / Wound Assessment  Granulation tissue    % Wound base Red or Granulating  --   99%   % Wound base Yellow/Fibrinous Exudate  --   1%   % Wound base Black/Eschar  0%    Peri-wound Assessment  Intact;Edema   margins are "puffy"   Margins  Attached edges (approximated)    Drainage Amount  Minimal    Drainage Description  Serosanguineous    Treatment  Cleansed;Debridement (Selective)    Selective Debridement - Location  wound bed, perimeter    Selective Debridement - Tools Used  Forceps    Selective Debridement - Tissue Removed  slough and devitalized tissues     Wound Therapy - Clinical Statement  Patient's wound presents similar to last session. Debrided dry tissue at  margins this session to promote approximation, pt with increased tenderness to debridement. At superior border there is a small pocket visible with slough at the base, unable to debride fully due to small space and pain. Continued with xeroform, 2x2, and medipore tape.     Hydrotherapy Plan  Debridement;Dressing change;Patient/family education;Pulsatile lavage with suction    Wound Therapy - Frequency  2X / week   4 additional weeks   Wound Therapy - Current Recommendations  PT    Wound Plan  continue with wound care and appropriate dressings. Measure next session (Friday).    Dressing   xeroform, 2x2, medipore tape        PT Short Term Goals - 11/24/18 1057      PT SHORT TERM GOAL #1   Title  PT wound to be 100% granulated to decrease cellulitis    Time  2    Period  Weeks     Status  On-going    Target Date  08/17/18      PT SHORT TERM GOAL #2   Title  Pt pain to be no greater than a 4/10 to allow pt to be able to tolerate standing/walking for up  to 30 minutes without increased pain for improved functional ability.     Time  2    Period  Weeks    Status  Achieved        PT Long Term Goals - 11/24/18 1057      PT LONG TERM GOAL #1   Title  Wound size to be decreased to no greater than 1.5x1.5 cm to allow pt to feel comfortable to complete self care for wound.     Time  4    Period  Weeks    Status  Achieved      PT LONG TERM GOAL #2   Title  Pt to be able to vocalize the benefits of wearing compression garments for varicosities.     Time  4    Period  Weeks    Status  On-going        Plan - 12/04/18 1154    Clinical Impression Statement  see above    Rehab Potential  Good    PT Frequency  2x / week    PT Duration  4 weeks   4 more   PT Treatment/Interventions  ADLs/Self Care Home Management;Patient/family education;Other (comment)   debridement and dressing change    PT Next Visit Plan  see above    Consulted and Agree with Plan of Care  Patient       Patient will benefit from skilled therapeutic intervention in order to improve the following deficits and impairments:  Decreased skin integrity, Pain  Visit Diagnosis: Pain in right lower leg  Open leg wound, right, sequela     Problem List Patient Active Problem List   Diagnosis Date Noted  . Rash and nonspecific skin eruption 04/06/2015  . Acute blood loss anemia 03/22/2015  . Edema 03/21/2015  . Acute renal failure syndrome ([place])   . Closed right hip fracture ([place]) 03/14/2015  . Rheumatoid arthritis ([place]) 03/14/2015  . Fall 03/14/2015    Kipp Brood, PT, DPT, [facility] Physical Therapist with [facility]  12/04/2018 11:54 AM    [place] [address] [place], [name], [address] Phone:  [phone]   Fax:  [phone]  Name: Michaela Simpson MRN: [id number] Date of Birth: 1932/04/09

## 2018-12-06 ENCOUNTER — Other Ambulatory Visit: Payer: Self-pay | Admitting: Internal Medicine

## 2018-12-06 ENCOUNTER — Ambulatory Visit (HOSPITAL_COMMUNITY): Payer: Self-pay

## 2018-12-06 DIAGNOSIS — F331 Major depressive disorder, recurrent, moderate: Secondary | ICD-10-CM | POA: Diagnosis not present

## 2018-12-06 DIAGNOSIS — M79641 Pain in right hand: Secondary | ICD-10-CM | POA: Diagnosis not present

## 2018-12-06 DIAGNOSIS — N183 Chronic kidney disease, stage 3 (moderate): Secondary | ICD-10-CM | POA: Diagnosis not present

## 2018-12-06 DIAGNOSIS — I10 Essential (primary) hypertension: Secondary | ICD-10-CM | POA: Diagnosis not present

## 2018-12-06 DIAGNOSIS — E782 Mixed hyperlipidemia: Secondary | ICD-10-CM | POA: Diagnosis not present

## 2018-12-06 DIAGNOSIS — Z780 Asymptomatic menopausal state: Secondary | ICD-10-CM

## 2018-12-06 DIAGNOSIS — L97911 Non-pressure chronic ulcer of unspecified part of right lower leg limited to breakdown of skin: Secondary | ICD-10-CM | POA: Diagnosis not present

## 2018-12-06 DIAGNOSIS — M79642 Pain in left hand: Secondary | ICD-10-CM | POA: Diagnosis not present

## 2018-12-08 ENCOUNTER — Ambulatory Visit (HOSPITAL_COMMUNITY): Payer: Medicare Other

## 2018-12-08 ENCOUNTER — Encounter (HOSPITAL_COMMUNITY): Payer: Self-pay

## 2018-12-08 ENCOUNTER — Other Ambulatory Visit: Payer: Self-pay

## 2018-12-08 DIAGNOSIS — M79661 Pain in right lower leg: Secondary | ICD-10-CM | POA: Diagnosis not present

## 2018-12-08 DIAGNOSIS — S81801S Unspecified open wound, right lower leg, sequela: Secondary | ICD-10-CM

## 2018-12-08 NOTE — Therapy (Signed)
[place], [name], [address] Phone: [phone]   Fax:  [phone]  Wound Care Therapy  Patient Details  Name: Michaela Simpson MRN: [id number] Date of Birth: January 04, 1932 Referring Provider (PT): Dr. Allyn Kenner   Encounter Date: 12/08/2018  PT End of Session - 12/08/18 1150    Visit Number  42   progress note completed visit #38   Number of Visits  47    Date for PT Re-Evaluation  12/22/18    Authorization Type  Medicare Part A & B    Authorization Time Period  08/03/18- 09/02/18; 09/04/2018 - 10/02/2018; 09/25/18-10/16/18; 10/16/18-10/30/18; 10/30/18-11/27/18; NEW: 11/24/2018-12/22/2018    Authorization - Visit Number  5    Authorization - Number of Visits  10    PT Start Time  1108    PT Stop Time  1128    PT Time Calculation (min)  20 min    Activity Tolerance  Patient tolerated treatment well    Behavior During Therapy  [facility] for tasks assessed/performed       Past Medical History:  Diagnosis Date  . Osteoporosis   . Rheumatoid arthritis [facility])     Past Surgical History:  Procedure Laterality Date  . INTRAMEDULLARY (IM) NAIL INTERTROCHANTERIC Right 03/14/2015   Procedure: RIGHT INTERTROCHANTRIC INTRAMEDULLARY (IM) NAIL ;  Surgeon: Mcarthur Rossetti, MD;  Location: [place];  Service: Orthopedics;  Laterality: Right;    There were no vitals filed for this visit.    Wound Therapy - 12/08/18 1143    Subjective  Patient reports no pain today. She states it hurt after her last session but has not been bothering her today.     Patient and Family Stated Goals  wound to heal     Date of Onset  05/05/18    Prior Treatments  self care, antibiotics     Pain Scale  0-10    Pain Score  0-No pain    Evaluation and Treatment Procedures Explained to Patient/Family  Yes    Evaluation and Treatment Procedures  agreed to    Wound Properties Date First Assessed: 08/03/18 Time First Assessed: 0949 Wound Type: Puncture  Location: Leg Location Orientation: Right Present on Admission: Yes   Dressing Type  Gauze (Comment);Silver hydrofiber;Impregnated gauze (bismuth);Alginate    Dressing Changed  Changed    Dressing Status  Old drainage    Dressing Change Frequency  PRN    Site / Wound Assessment  Granulation tissue    % Wound base Red or Granulating  --   99%   % Wound base Yellow/Fibrinous Exudate  --   1%   % Wound base Black/Eschar  0%    Peri-wound Assessment  Intact;Edema   margins are "puffy"   Wound Length (cm)  1.1 cm   1.1   Wound Width (cm)  0.4 cm   0.3   Wound Depth (cm)  0.1 cm   0.1   Wound Volume (cm^3)  0.04 cm^3    Wound Surface Area (cm^2)  0.44 cm^2    Margins  Attached edges (approximated)    Drainage Amount  Minimal    Drainage Description  Serosanguineous    Treatment  Cleansed;Debridement (Selective)    Selective Debridement - Location  wound bed, perimeter    Selective Debridement - Tools Used  Forceps    Selective Debridement - Tissue Removed  slough and devitalized tissues     Wound Therapy - Clinical Statement  Patient's  wound continues to progress slowly. Debrided margins of wound to promote approximation and patient had improved tolerance to this today compared to last session. The small pocket at the superior boarder appears to have filled in more and is reduced in size. Continued with xerform as primary wound dressing and followed with alginate to allow for more drainage absorption as wound bed is typically too moist upon dressing removal. She will continue to benefit from skilled wound care to further promote healing.     Hydrotherapy Plan  Debridement;Dressing change;Patient/family education;Pulsatile lavage with suction    Wound Therapy - Frequency  2X / week   4 additional weeks   Wound Therapy - Current Recommendations  PT    Wound Plan  continue with wound care and appropriate dressings. Measure next session (Friday).    Dressing   xeroform, alginate, 2x2,  medipore tape        PT Short Term Goals - 11/24/18 1057      PT SHORT TERM GOAL #1   Title  PT wound to be 100% granulated to decrease cellulitis    Time  2    Period  Weeks    Status  On-going    Target Date  08/17/18      PT SHORT TERM GOAL #2   Title  Pt pain to be no greater than a 4/10 to allow pt to be able to tolerate standing/walking for up  to 30 minutes without increased pain for improved functional ability.     Time  2    Period  Weeks    Status  Achieved        PT Long Term Goals - 11/24/18 1057      PT LONG TERM GOAL #1   Title  Wound size to be decreased to no greater than 1.5x1.5 cm to allow pt to feel comfortable to complete self care for wound.     Time  4    Period  Weeks    Status  Achieved      PT LONG TERM GOAL #2   Title  Pt to be able to vocalize the benefits of wearing compression garments for varicosities.     Time  4    Period  Weeks    Status  On-going       Plan - 12/08/18 1150    Clinical Impression Statement  see above    Rehab Potential  Good    PT Frequency  2x / week    PT Duration  4 weeks   4 more   PT Treatment/Interventions  ADLs/Self Care Home Management;Patient/family education;Other (comment)   debridement and dressing change    PT Next Visit Plan  see above    Consulted and Agree with Plan of Care  Patient       Patient will benefit from skilled therapeutic intervention in order to improve the following deficits and impairments:  Decreased skin integrity, Pain  Visit Diagnosis: 1. Pain in right lower leg   2. Open leg wound, right, sequela        Problem List Patient Active Problem List   Diagnosis Date Noted  . Rash and nonspecific skin eruption 04/06/2015  . Acute blood loss anemia 03/22/2015  . Edema 03/21/2015  . Acute renal failure syndrome ([place])   . Closed right hip fracture ([place]) 03/14/2015  . Rheumatoid arthritis ([place]) 03/14/2015  . Fall 03/14/2015    Kipp Brood, PT, DPT, [facility] Physical  Therapist with [facility]  [facility]  12/08/2018 11:51 AM    [place] [address] [place], [name], [address] Phone: [phone]   Fax:  [phone]  Name: Michaela Simpson MRN: [id number] Date of Birth: 1932/04/19

## 2018-12-11 ENCOUNTER — Encounter (HOSPITAL_COMMUNITY): Payer: Self-pay

## 2018-12-11 ENCOUNTER — Other Ambulatory Visit: Payer: Self-pay

## 2018-12-11 ENCOUNTER — Ambulatory Visit (HOSPITAL_COMMUNITY): Payer: Medicare Other

## 2018-12-11 DIAGNOSIS — S81801S Unspecified open wound, right lower leg, sequela: Secondary | ICD-10-CM | POA: Diagnosis not present

## 2018-12-11 DIAGNOSIS — M79661 Pain in right lower leg: Secondary | ICD-10-CM | POA: Diagnosis not present

## 2018-12-11 NOTE — Therapy (Signed)
[place], [name], [address] Phone: [phone]   Fax:  [phone]  Wound Care Therapy  Patient Details  Name: Michaela Simpson MRN: [id number] Date of Birth: 1931/06/22 Referring Provider (PT): Dr. Allyn Kenner   Encounter Date: 12/11/2018  PT End of Session - 12/11/18 0955    Visit Number  43   progress note completed visit #38   Number of Visits  47    Date for PT Re-Evaluation  12/22/18    Authorization Type  Medicare Part A & B    Authorization Time Period  08/03/18- 09/02/18; 09/04/2018 - 10/02/2018; 09/25/18-10/16/18; 10/16/18-10/30/18; 10/30/18-11/27/18; NEW: 11/24/2018-12/22/2018    Authorization - Visit Number  6    Authorization - Number of Visits  10    PT Start Time  1324    PT Stop Time  0934    PT Time Calculation (min)  20 min    Activity Tolerance  Patient tolerated treatment well    Behavior During Therapy  [facility] for tasks assessed/performed       Past Medical History:  Diagnosis Date  . Osteoporosis   . Rheumatoid arthritis [facility])     Past Surgical History:  Procedure Laterality Date  . INTRAMEDULLARY (IM) NAIL INTERTROCHANTERIC Right 03/14/2015   Procedure: RIGHT INTERTROCHANTRIC INTRAMEDULLARY (IM) NAIL ;  Surgeon: Mcarthur Rossetti, MD;  Location: [place];  Service: Orthopedics;  Laterality: Right;    There were no vitals filed for this visit.    Wound Therapy - 12/11/18 0940    Subjective  Patietn reports she had a quiet weekend. She reports there is a sensitivity to her wound today again but its not bad or painful.    Patient and Family Stated Goals  wound to heal     Date of Onset  05/05/18    Prior Treatments  self care, antibiotics     Pain Scale  0-10    Pain Score  0-No pain    Evaluation and Treatment Procedures Explained to Patient/Family  Yes    Evaluation and Treatment Procedures  agreed to    Wound Properties Date First Assessed: 08/03/18 Time First Assessed: 0949 Wound Type:  Puncture Location: Leg Location Orientation: Right Present on Admission: Yes   Dressing Type  Gauze (Comment);Silver hydrofiber;Impregnated gauze (bismuth);Alginate    Dressing Status  Old drainage    Dressing Change Frequency  PRN    Site / Wound Assessment  Granulation tissue    % Wound base Red or Granulating  --   99%   % Wound base Yellow/Fibrinous Exudate  --   1%   % Wound base Black/Eschar  0%    Peri-wound Assessment  Intact;Edema   margins are "puffy"   Margins  Attached edges (approximated)    Drainage Amount  Minimal    Drainage Description  Serosanguineous    Treatment  Cleansed;Debridement (Selective)    Selective Debridement - Location  wound bed, perimeter    Selective Debridement - Tools Used  Forceps    Selective Debridement - Tissue Removed  slough and devitalized tissues     Wound Therapy - Clinical Statement  Patient's wound has little change since last session. The wound bed does not have as much drainage present in wound bed after alginate application overtop of the xeroform. Able to debride dry edges today and slough from wound bed more easily today with reduced sensitivity for patient. Continued with xeroform and alginate to dress wound. Patient will  continue to benefit from skilled wound care to further promote healing.    Hydrotherapy Plan  Debridement;Dressing change;Patient/family education;Pulsatile lavage with suction    Wound Therapy - Frequency  2X / week   4 additional weeks   Wound Therapy - Current Recommendations  PT    Wound Plan  continue with wound care and appropriate dressings. Measure next session (Friday).    Dressing   xeroform, alginate, 2x2, medipore tape        PT Short Term Goals - 11/24/18 1057      PT SHORT TERM GOAL #1   Title  PT wound to be 100% granulated to decrease cellulitis    Time  2    Period  Weeks    Status  On-going    Target Date  08/17/18      PT SHORT TERM GOAL #2   Title  Pt pain to be no greater than a 4/10  to allow pt to be able to tolerate standing/walking for up  to 30 minutes without increased pain for improved functional ability.     Time  2    Period  Weeks    Status  Achieved        PT Long Term Goals - 11/24/18 1057      PT LONG TERM GOAL #1   Title  Wound size to be decreased to no greater than 1.5x1.5 cm to allow pt to feel comfortable to complete self care for wound.     Time  4    Period  Weeks    Status  Achieved      PT LONG TERM GOAL #2   Title  Pt to be able to vocalize the benefits of wearing compression garments for varicosities.     Time  4    Period  Weeks    Status  On-going         Plan - 12/11/18 0955    Clinical Impression Statement  see above    Rehab Potential  Good    PT Frequency  2x / week    PT Duration  4 weeks   4 more   PT Treatment/Interventions  ADLs/Self Care Home Management;Patient/family education;Other (comment)   debridement and dressing change    PT Next Visit Plan  see above    Consulted and Agree with Plan of Care  Patient       Patient will benefit from skilled therapeutic intervention in order to improve the following deficits and impairments:  Decreased skin integrity, Pain  Visit Diagnosis: 1. Pain in right lower leg   2. Open leg wound, right, sequela        Problem List Patient Active Problem List   Diagnosis Date Noted  . Rash and nonspecific skin eruption 04/06/2015  . Acute blood loss anemia 03/22/2015  . Edema 03/21/2015  . Acute renal failure syndrome ([place])   . Closed right hip fracture ([place]) 03/14/2015  . Rheumatoid arthritis ([place]) 03/14/2015  . Fall 03/14/2015    Kipp Brood, PT, DPT, [facility] Physical Therapist with [facility]  12/11/2018 9:56 AM    [place] [address] [place], [name], [address] Phone: [phone]   Fax:  [phone]  Name: ALVERTA CACCAMO MRN: [id number] Date of Birth: December 13, 1931

## 2018-12-13 ENCOUNTER — Ambulatory Visit (HOSPITAL_COMMUNITY): Payer: Self-pay

## 2018-12-15 ENCOUNTER — Other Ambulatory Visit: Payer: Self-pay

## 2018-12-15 ENCOUNTER — Ambulatory Visit (HOSPITAL_COMMUNITY): Payer: Medicare Other

## 2018-12-15 ENCOUNTER — Encounter (HOSPITAL_COMMUNITY): Payer: Self-pay

## 2018-12-15 DIAGNOSIS — M79661 Pain in right lower leg: Secondary | ICD-10-CM

## 2018-12-15 DIAGNOSIS — S81801S Unspecified open wound, right lower leg, sequela: Secondary | ICD-10-CM

## 2018-12-15 NOTE — Therapy (Signed)
[name], [name], [address] Phone: [phone]   Fax:  [phone]  Wound Care Therapy  Patient Details  Name: Michaela Simpson MRN: [id number] Date of Birth: 29-May-1932 Referring Provider (PT): Dr. Allyn Kenner   Encounter Date: 12/15/2018  PT End of Session - 12/15/18 0954    Visit Number  14   progress note completed visit #38   Number of Visits  47    Date for PT Re-Evaluation  12/22/18    Authorization Type  Medicare Part A & B    Authorization Time Period  08/03/18- 09/02/18; 09/04/2018 - 10/02/2018; 09/25/18-10/16/18; 10/16/18-10/30/18; 10/30/18-11/27/18; NEW: 11/24/2018-12/22/2018    Authorization - Visit Number  7    Authorization - Number of Visits  10    PT Start Time  0908    PT Stop Time  0933    PT Time Calculation (min)  25 min    Activity Tolerance  Patient tolerated treatment well    Behavior During Therapy  [name] for tasks assessed/performed       Past Medical History:  Diagnosis Date  . Osteoporosis   . Rheumatoid arthritis [facility])     Past Surgical History:  Procedure Laterality Date  . INTRAMEDULLARY (IM) NAIL INTERTROCHANTERIC Right 03/14/2015   Procedure: RIGHT INTERTROCHANTRIC INTRAMEDULLARY (IM) NAIL ;  Surgeon: Mcarthur Rossetti, MD;  Location: [facility];  Service: Orthopedics;  Laterality: Right;    There were no vitals filed for this visit.    Wound Therapy - 12/15/18 0950    Subjective  Patient reports her wound has not been talking to her as much.     Patient and Family Stated Goals  wound to heal     Date of Onset  05/05/18    Prior Treatments  self care, antibiotics     Pain Scale  0-10    Pain Score  0-No pain    Evaluation and Treatment Procedures Explained to Patient/Family  Yes    Evaluation and Treatment Procedures  agreed to    Wound Properties Date First Assessed: 08/03/18 Time First Assessed: 0949 Wound Type: Puncture Location: Leg Location Orientation: Right Present on  Admission: Yes   Dressing Type  Gauze (Comment);Silver hydrofiber;Impregnated gauze (bismuth);Alginate    Dressing Status  Old drainage    Dressing Change Frequency  PRN    Site / Wound Assessment  Granulation tissue    % Wound base Red or Granulating  --   99%   % Wound base Yellow/Fibrinous Exudate  --   1%   % Wound base Black/Eschar  0%    Peri-wound Assessment  Intact;Edema   margins are "puffy"   Wound Length (cm)  1.1 cm   1.1   Wound Width (cm)  0.4 cm   0.4   Wound Depth (cm)  0.1 cm   0.1   Wound Volume (cm^3)  0.04 cm^3    Wound Surface Area (cm^2)  0.44 cm^2    Margins  Attached edges (approximated)    Drainage Amount  Minimal    Drainage Description  Serosanguineous    Treatment  Cleansed;Debridement (Selective)    Selective Debridement - Location  wound bed, perimeter    Selective Debridement - Tools Used  Forceps    Selective Debridement - Tissue Removed  slough and devitalized tissues     Wound Therapy - Clinical Statement  There has bee no change in patient's wound size since last visit. Wound bed continues to  have drainage and appears too moist on removal of dressing. Able to cleanse wound bed and debride margins and bed of slough and patient was less sensitive this session. Applied alginate only to reduce moisture being trapped in wound bed. Patient will continue to benefit from skilled wound care to further promote healing.    Hydrotherapy Plan  Debridement;Dressing change;Patient/family education;Pulsatile lavage with suction    Wound Therapy - Frequency  2X / week   4 additional weeks   Wound Therapy - Current Recommendations  PT    Wound Plan  continue with wound care and appropriate dressings. Measure next session (Friday).    Dressing   xeroform, alginate, 2x2, medipore tape         PT Short Term Goals - 11/24/18 1057      PT SHORT TERM GOAL #1   Title  PT wound to be 100% granulated to decrease cellulitis    Time  2    Period  Weeks    Status   On-going    Target Date  08/17/18      PT SHORT TERM GOAL #2   Title  Pt pain to be no greater than a 4/10 to allow pt to be able to tolerate standing/walking for up  to 30 minutes without increased pain for improved functional ability.     Time  2    Period  Weeks    Status  Achieved        PT Long Term Goals - 11/24/18 1057      PT LONG TERM GOAL #1   Title  Wound size to be decreased to no greater than 1.5x1.5 cm to allow pt to feel comfortable to complete self care for wound.     Time  4    Period  Weeks    Status  Achieved      PT LONG TERM GOAL #2   Title  Pt to be able to vocalize the benefits of wearing compression garments for varicosities.     Time  4    Period  Weeks    Status  On-going         Plan - 12/15/18 0954    Clinical Impression Statement  see above    Rehab Potential  Good    PT Frequency  2x / week    PT Duration  4 weeks   4 more   PT Treatment/Interventions  ADLs/Self Care Home Management;Patient/family education;Other (comment)   debridement and dressing change    PT Next Visit Plan  see above    Consulted and Agree with Plan of Care  Patient       Patient will benefit from skilled therapeutic intervention in order to improve the following deficits and impairments:  Decreased skin integrity, Pain  Visit Diagnosis: 1. Pain in right lower leg   2. Open leg wound, right, sequela        Problem List Patient Active Problem List   Diagnosis Date Noted  . Rash and nonspecific skin eruption 04/06/2015  . Acute blood loss anemia 03/22/2015  . Edema 03/21/2015  . Acute renal failure syndrome ([place])   . Closed right hip fracture ([place]) 03/14/2015  . Rheumatoid arthritis ([place]) 03/14/2015  . Fall 03/14/2015    Kipp Brood, PT, DPT, [facility] Physical Therapist with [facility]  12/15/2018 9:55 AM    [facility] [address] [place], [name], [address] Phone: [phone]  Fax:  [phone]  Name: Michaela Simpson MRN: [id number] Date of Birth: May 27, 1932

## 2018-12-18 ENCOUNTER — Encounter (HOSPITAL_COMMUNITY): Payer: Self-pay

## 2018-12-18 ENCOUNTER — Other Ambulatory Visit: Payer: Self-pay

## 2018-12-18 ENCOUNTER — Ambulatory Visit (HOSPITAL_COMMUNITY): Payer: Medicare Other

## 2018-12-18 DIAGNOSIS — M79661 Pain in right lower leg: Secondary | ICD-10-CM | POA: Diagnosis not present

## 2018-12-18 DIAGNOSIS — S81801S Unspecified open wound, right lower leg, sequela: Secondary | ICD-10-CM | POA: Diagnosis not present

## 2018-12-18 NOTE — Therapy (Signed)
[name], [name], [address] Phone: [phone]   Fax:  [phone]  Wound Care Therapy  Patient Details  Name: Michaela Simpson MRN: [id number] Date of Birth: 1932/04/08 Referring Provider (PT): Dr. Allyn Kenner   Encounter Date: 12/18/2018  PT End of Session - 12/18/18 0940    Visit Number  45   progress note completed visit #38   Number of Visits  47    Date for PT Re-Evaluation  12/22/18    Authorization Type  Medicare Part A & B    Authorization Time Period  08/03/18- 09/02/18; 09/04/2018 - 10/02/2018; 09/25/18-10/16/18; 10/16/18-10/30/18; 10/30/18-11/27/18; NEW: 11/24/2018-12/22/2018    Authorization - Visit Number  8    Authorization - Number of Visits  10    PT Start Time  0910    PT Stop Time  0932    PT Time Calculation (min)  22 min    Activity Tolerance  Patient tolerated treatment well    Behavior During Therapy  [facility] for tasks assessed/performed       Past Medical History:  Diagnosis Date  . Osteoporosis   . Rheumatoid arthritis [facility])     Past Surgical History:  Procedure Laterality Date  . INTRAMEDULLARY (IM) NAIL INTERTROCHANTERIC Right 03/14/2015   Procedure: RIGHT INTERTROCHANTRIC INTRAMEDULLARY (IM) NAIL ;  Surgeon: Mcarthur Rossetti, MD;  Location: [facility];  Service: Orthopedics;  Laterality: Right;    There were no vitals filed for this visit.    Wound Therapy - 12/18/18 0936    Subjective  Patient arrives with no complaints and states she is doing well. Her wound did not hurt or bother her this weekend.    Patient and Family Stated Goals  wound to heal     Date of Onset  05/05/18    Prior Treatments  self care, antibiotics     Pain Scale  0-10    Pain Score  0-No pain    Evaluation and Treatment Procedures Explained to Patient/Family  Yes    Evaluation and Treatment Procedures  agreed to    Wound Properties Date First Assessed: 08/03/18 Time First Assessed: 0949 Wound Type: Puncture  Location: Leg Location Orientation: Right Present on Admission: Yes   Dressing Type  Gauze (Comment);Silver hydrofiber;Impregnated gauze (bismuth);Alginate    Dressing Changed  Changed    Dressing Status  Old drainage    Dressing Change Frequency  PRN    Site / Wound Assessment  Granulation tissue    % Wound base Red or Granulating  --   99%   % Wound base Yellow/Fibrinous Exudate  --   1%   % Wound base Black/Eschar  0%    Peri-wound Assessment  Intact;Edema   margins are "puffy"   Margins  Attached edges (approximated)    Drainage Amount  Minimal    Drainage Description  Serosanguineous    Treatment  Cleansed;Debridement (Selective)    Selective Debridement - Location  wound bed, perimeter    Selective Debridement - Tools Used  Forceps    Selective Debridement - Tissue Removed  slough and devitalized tissues     Wound Therapy - Clinical Statement  Patient's wound appears substantially decreased in size today. Wound bed remains moist and granulated and margins are slightly dried this session. Cleansed wound with saline and debrided margins with forceps, pt reported decreased sensitivity from prior sessions. Continued with alginate as primary agent to wound followed by 2x2 as patient shows good healing  results with this. She will continue to benefit from skilled wound care to further promote healing.    Hydrotherapy Plan  Debridement;Dressing change;Patient/family education;Pulsatile lavage with suction    Wound Therapy - Frequency  2X / week   4 additional weeks   Wound Therapy - Current Recommendations  PT    Wound Plan  continue with wound care and appropriate dressings. Measure next session (Friday).    Dressing   alginate, 2x2, medipore tape         PT Short Term Goals - 11/24/18 1057      PT SHORT TERM GOAL #1   Title  PT wound to be 100% granulated to decrease cellulitis    Time  2    Period  Weeks    Status  On-going    Target Date  08/17/18      PT SHORT TERM GOAL #2    Title  Pt pain to be no greater than a 4/10 to allow pt to be able to tolerate standing/walking for up  to 30 minutes without increased pain for improved functional ability.     Time  2    Period  Weeks    Status  Achieved        PT Long Term Goals - 11/24/18 1057      PT LONG TERM GOAL #1   Title  Wound size to be decreased to no greater than 1.5x1.5 cm to allow pt to feel comfortable to complete self care for wound.     Time  4    Period  Weeks    Status  Achieved      PT LONG TERM GOAL #2   Title  Pt to be able to vocalize the benefits of wearing compression garments for varicosities.     Time  4    Period  Weeks    Status  On-going        Plan - 12/18/18 0941    Clinical Impression Statement  see above    Rehab Potential  Good    PT Frequency  2x / week    PT Duration  4 weeks   4 more   PT Treatment/Interventions  ADLs/Self Care Home Management;Patient/family education;Other (comment)   debridement and dressing change    PT Next Visit Plan  see above    Consulted and Agree with Plan of Care  Patient       Patient will benefit from skilled therapeutic intervention in order to improve the following deficits and impairments:  Decreased skin integrity, Pain  Visit Diagnosis: 1. Pain in right lower leg   2. Open leg wound, right, sequela        Problem List Patient Active Problem List   Diagnosis Date Noted  . Rash and nonspecific skin eruption 04/06/2015  . Acute blood loss anemia 03/22/2015  . Edema 03/21/2015  . Acute renal failure syndrome ([place])   . Closed right hip fracture ([place]) 03/14/2015  . Rheumatoid arthritis ([place]) 03/14/2015  . Fall 03/14/2015    Kipp Brood, PT, DPT, [facility] Physical Therapist with [facility]  12/18/2018 9:42 AM    [facility] [address] [place], [name], [address] Phone: [phone]   Fax:  [phone]  Name: Michaela Simpson MRN:  [id number] Date of Birth: [birth year]

## 2018-12-20 ENCOUNTER — Telehealth (HOSPITAL_COMMUNITY): Payer: Self-pay | Admitting: Internal Medicine

## 2018-12-20 ENCOUNTER — Other Ambulatory Visit: Payer: Self-pay

## 2018-12-20 ENCOUNTER — Ambulatory Visit (HOSPITAL_COMMUNITY): Payer: Self-pay

## 2018-12-20 ENCOUNTER — Ambulatory Visit (HOSPITAL_COMMUNITY): Payer: Medicare Other | Attending: Internal Medicine | Admitting: Physical Therapy

## 2018-12-20 DIAGNOSIS — S81801S Unspecified open wound, right lower leg, sequela: Secondary | ICD-10-CM | POA: Diagnosis not present

## 2018-12-20 DIAGNOSIS — M79661 Pain in right lower leg: Secondary | ICD-10-CM | POA: Insufficient documentation

## 2018-12-20 NOTE — Telephone Encounter (Signed)
12/20/18  Phone tree had where she cancelled her appt.  I called her to verify and she said that she got mixed up and will be coming to therapy today.

## 2018-12-20 NOTE — Therapy (Signed)
[place], [name], [address] Phone: [phone]   Fax:  [phone]  Wound Care Therapy  Patient Details  Name: Michaela Simpson MRN: [id number] Date of Birth: 11/07/1931 Referring Provider (PT): Dr. Allyn Kenner   Encounter Date: 12/20/2018  PT End of Session - 12/20/18 1354    Visit Number  67   progress note completed visit #38   Number of Visits  47    Date for PT Re-Evaluation  12/22/18    Authorization Type  Medicare Part A & B    Authorization Time Period  08/03/18- 09/02/18; 09/04/2018 - 10/02/2018; 09/25/18-10/16/18; 10/16/18-10/30/18; 10/30/18-11/27/18; NEW: 11/24/2018-12/22/2018    Authorization - Visit Number  9    Authorization - Number of Visits  10    PT Start Time  1130    PT Stop Time  1148    PT Time Calculation (min)  18 min    Activity Tolerance  Patient tolerated treatment well    Behavior During Therapy  [facility] for tasks assessed/performed       Past Medical History:  Diagnosis Date  . Osteoporosis   . Rheumatoid arthritis [facility])     Past Surgical History:  Procedure Laterality Date  . INTRAMEDULLARY (IM) NAIL INTERTROCHANTERIC Right 03/14/2015   Procedure: RIGHT INTERTROCHANTRIC INTRAMEDULLARY (IM) NAIL ;  Surgeon: Mcarthur Rossetti, MD;  Location: [facility];  Service: Orthopedics;  Laterality: Right;    There were no vitals filed for this visit.              Wound Therapy - 12/20/18 1351    Subjective  pt states she is doing well today.  Dressing is still intact.     Patient and Family Stated Goals  wound to heal     Date of Onset  05/05/18    Prior Treatments  self care, antibiotics     Pain Scale  0-10    Pain Score  0-No pain    Evaluation and Treatment Procedures Explained to Patient/Family  Yes    Evaluation and Treatment Procedures  agreed to    Wound Properties Date First Assessed: 08/03/18 Time First Assessed: 0949 Wound Type: Puncture Location: Leg Location Orientation:  Right Present on Admission: Yes   Dressing Type  Gauze (Comment);Silver hydrofiber;Impregnated gauze (bismuth);Alginate    Dressing Changed  Changed    Dressing Status  Old drainage    Dressing Change Frequency  PRN    Site / Wound Assessment  Granulation tissue    % Wound base Red or Granulating  100%   99%   % Wound base Yellow/Fibrinous Exudate  --   1%   % Wound base Black/Eschar  0%    Peri-wound Assessment  Intact;Edema   margins are "puffy"   Margins  Attached edges (approximated)    Drainage Amount  Minimal    Drainage Description  Serosanguineous    Treatment  Cleansed;Debridement (Selective)    Selective Debridement - Location  wound bed, perimeter    Selective Debridement - Tools Used  Forceps    Selective Debridement - Tissue Removed  slough and devitalized tissues     Wound Therapy - Clinical Statement  dressing slightly adhered to wound.  No maceration and little drainage present.  Edges continue to come in, however sheath of dry skin perimeter of entire wound preventing approximation.  Able to debride this and cleanse area well.  Continued with alginate dressing and secured with gauze and medipore.  Hydrotherapy Plan  Debridement;Dressing change;Patient/family education;Pulsatile lavage with suction    Wound Therapy - Frequency  2X / week   4 additional weeks   Wound Therapy - Current Recommendations  PT    Wound Plan  continue with wound care and appropriate dressings. Measure next session    Dressing   alginate, 2x2, medipore tape                PT Short Term Goals - 11/24/18 1057      PT SHORT TERM GOAL #1   Title  PT wound to be 100% granulated to decrease cellulitis    Time  2    Period  Weeks    Status  On-going    Target Date  08/17/18      PT SHORT TERM GOAL #2   Title  Pt pain to be no greater than a 4/10 to allow pt to be able to tolerate standing/walking for up  to 30 minutes without increased pain for improved functional ability.      Time  2    Period  Weeks    Status  Achieved        PT Long Term Goals - 11/24/18 1057      PT LONG TERM GOAL #1   Title  Wound size to be decreased to no greater than 1.5x1.5 cm to allow pt to feel comfortable to complete self care for wound.     Time  4    Period  Weeks    Status  Achieved      PT LONG TERM GOAL #2   Title  Pt to be able to vocalize the benefits of wearing compression garments for varicosities.     Time  4    Period  Weeks    Status  On-going              Patient will benefit from skilled therapeutic intervention in order to improve the following deficits and impairments:     Visit Diagnosis: 1. Open leg wound, right, sequela        Problem List Patient Active Problem List   Diagnosis Date Noted  . Rash and nonspecific skin eruption 04/06/2015  . Acute blood loss anemia 03/22/2015  . Edema 03/21/2015  . Acute renal failure syndrome ([place])   . Closed right hip fracture ([place]) 03/14/2015  . Rheumatoid arthritis ([place]) 03/14/2015  . Fall 03/14/2015   Teena Irani, PTA/CLT [phone]  Teena Irani 12/20/2018, 1:54 PM  [facility] [address] [place], [name], [address] Phone: [phone]   Fax:  [phone]  Name: Michaela Simpson MRN: [id number] Date of Birth: [birth year]

## 2018-12-26 ENCOUNTER — Other Ambulatory Visit: Payer: Self-pay

## 2018-12-26 ENCOUNTER — Ambulatory Visit (HOSPITAL_COMMUNITY): Payer: Medicare Other | Admitting: Physical Therapy

## 2018-12-26 DIAGNOSIS — S81801S Unspecified open wound, right lower leg, sequela: Secondary | ICD-10-CM

## 2018-12-26 DIAGNOSIS — M79661 Pain in right lower leg: Secondary | ICD-10-CM

## 2018-12-26 NOTE — Therapy (Addendum)
[name] [address] [place], [name], [address] Phone: [phone]   Fax:  [phone]  Wound Care Therapy Progress Note Reporting Period 11/27/2018   to   12/26/2018  See note below for Objective Data and Assessment of Progress/Goals.   I agree with Molli Knock assessment of patient's wound status and measures. Patient will benefit from additional 2 weeks of therapy and possible introduction of laser therapy to facilitate healing.   Kipp Brood, PT, DPT, [facility] Physical Therapist with [facility]    Patient Details  Name: Michaela Simpson MRN: [id number] Date of Birth: 1932-02-11 Referring Provider (PT): Dr. Allyn Kenner   Encounter Date: 12/26/2018  PT End of Session - 12/26/18 1010    Visit Number  22   progress note completed visit #47   Number of Visits  61    Date for PT Re-Evaluation  12/22/18    Authorization Type  Medicare Part A & B    Authorization Time Period  08/03/18- 09/02/18; 09/04/2018 - 10/02/2018; 09/25/18-10/16/18; 10/16/18-10/30/18; 10/30/18-11/27/18; 11/24/2018-12/22/2018; NEW: 12/26/18-01/09/19   Authorization - Visit Number  1   Authorization - Number of Visits  10    PT Start Time  0920    PT Stop Time  0945    PT Time Calculation (min)  25 min    Activity Tolerance  Patient tolerated treatment well    Behavior During Therapy  [facility] for tasks assessed/performed       Past Medical History:  Diagnosis Date  . Osteoporosis   . Rheumatoid arthritis [facility])     Past Surgical History:  Procedure Laterality Date  . INTRAMEDULLARY (IM) NAIL INTERTROCHANTERIC Right 03/14/2015   Procedure: RIGHT INTERTROCHANTRIC INTRAMEDULLARY (IM) NAIL ;  Surgeon: Mcarthur Rossetti, MD;  Location: [facility];  Service: Orthopedics;  Laterality: Right;    There were no vitals filed for this visit.              Wound Therapy - 12/26/18 0959    Subjective  no issues, pain.  Bandage remains intact.    Patient and Family Stated Goals  wound to heal     Date of Onset  05/05/18    Prior Treatments  self care, antibiotics     Pain Scale  0-10    Pain Score  0-No pain    Evaluation and Treatment Procedures Explained to Patient/Family  Yes    Evaluation and Treatment Procedures  agreed to    Wound Properties Date First Assessed: 08/03/18 Time First Assessed: 0949 Wound Type: Puncture Location: Leg Location Orientation: Right Present on Admission: Yes   Dressing Type  Gauze (Comment);Silver hydrofiber;Impregnated gauze (bismuth);Alginate    Dressing Changed  Changed    Dressing Status  Old drainage    Dressing Change Frequency  PRN    Site / Wound Assessment  Granulation tissue    % Wound base Red or Granulating  100%   99%   % Wound base Yellow/Fibrinous Exudate  --   1%   % Wound base Black/Eschar  0%    Peri-wound Assessment  Intact;Edema   margins are "puffy"   Wound Length (cm)  1.8 cm   was 1.1 cm on 6/26   Wound Width (cm)  0.7 cm   was 0.4 cm   Wound Depth (cm)  0 cm   was 0.1 cm   Wound Volume (cm^3)  0 cm^3    Wound Surface Area (cm^2)  1.26 cm^2  Margins  Attached edges (approximated)    Drainage Amount  Minimal    Drainage Description  Serosanguineous    Treatment  Cleansed;Debridement (Selective)    Selective Debridement - Location  wound bed, perimeter    Selective Debridement - Tools Used  Forceps    Selective Debridement - Tissue Removed  slough and devitalized tissues     Wound Therapy - Clinical Statement  more drainage noted today with debridement of perimeter dry skin, devitatlized tissue.  Remeasured with increase in size, however no longer with depth and remains 100%granulated.  To discuss more options with evaluating therapist to help speed up wound healing.      Hydrotherapy Plan  Debridement;Dressing change;Patient/family education;Pulsatile lavage with suction    Wound Therapy - Frequency  2X / week   4 additional weeks   Wound Therapy - Current  Recommendations  PT    Wound Plan  continue with wound care and appropriate dressings. Measure once weekly, possibly add laser to promote increased blood flow/healing.      Dressing   alginate, 2x2, medipore tape                PT Short Term Goals - 11/24/18 1057      PT SHORT TERM GOAL #1   Title  PT wound to be 100% granulated to decrease cellulitis    Time  2    Period  Weeks    Status  On-going    Target Date  08/17/18      PT SHORT TERM GOAL #2   Title  Pt pain to be no greater than a 4/10 to allow pt to be able to tolerate standing/walking for up  to 30 minutes without increased pain for improved functional ability.     Time  2    Period  Weeks    Status  Achieved        PT Long Term Goals - 11/24/18 1057      PT LONG TERM GOAL #1   Title  Wound size to be decreased to no greater than 1.5x1.5 cm to allow pt to feel comfortable to complete self care for wound.     Time  4    Period  Weeks    Status  Achieved      PT LONG TERM GOAL #2   Title  Pt to be able to vocalize the benefits of wearing compression garments for varicosities.     Time  4    Period  Weeks    Status  On-going              Patient will benefit from skilled therapeutic intervention in order to improve the following deficits and impairments:     Visit Diagnosis: 1. Open leg wound, right, sequela   2. Pain in right lower leg        Problem List Patient Active Problem List   Diagnosis Date Noted  . Rash and nonspecific skin eruption 04/06/2015  . Acute blood loss anemia 03/22/2015  . Edema 03/21/2015  . Acute renal failure syndrome ([place])   . Closed right hip fracture ([place]) 03/14/2015  . Rheumatoid arthritis ([place]) 03/14/2015  . Fall 03/14/2015   Teena Irani, PTA/CLT [phone]  Teena Irani 12/26/2018, 10:12 AM  [place], [name], [address] Phone: [phone]   Fax:  [phone]  Name:  RAMATA STROTHMAN MRN: [id number] Date of Birth: May 14, 1932

## 2019-01-01 ENCOUNTER — Telehealth (HOSPITAL_COMMUNITY): Payer: Self-pay | Admitting: Internal Medicine

## 2019-01-01 NOTE — Telephone Encounter (Signed)
01/01/19  Pt left a message that she had noticed some blood on the bandage of her leg and she did remove it.  She said there were red spots and didn't know what they were and it "looks serious all around her leg".  She said that she was really scared about it.  I called her this morning at 7:55am and she said that her leg didn't look as bad as it did yesterday but she was really scared when she saw the red spots.  She did want a later appt on 7/14 so I moved her from 930 to 1030.  I did offer an appt this morning but she said that she didn't think it was a serious this morning as it was yesterday and she would wait for the Tuesday appt.  I told her that if she noticed anything else to call us and we would get her in today.  She was fine with waiting until Tuesday but said that she would call if she noticed anything.

## 2019-01-01 NOTE — Addendum Note (Signed)
Addended by: Jacques Navy on: 01/01/2019 08:41 AM   Modules accepted: Orders

## 2019-01-02 ENCOUNTER — Other Ambulatory Visit: Payer: Self-pay

## 2019-01-02 ENCOUNTER — Ambulatory Visit (HOSPITAL_COMMUNITY): Payer: Medicare Other | Admitting: Physical Therapy

## 2019-01-02 DIAGNOSIS — S81801S Unspecified open wound, right lower leg, sequela: Secondary | ICD-10-CM | POA: Diagnosis not present

## 2019-01-02 DIAGNOSIS — M79661 Pain in right lower leg: Secondary | ICD-10-CM | POA: Diagnosis not present

## 2019-01-02 NOTE — Therapy (Signed)
[place], [name], [address] Phone: [phone]   Fax:  [phone]  Wound Care Therapy  Patient Details  Name: Michaela Simpson MRN: [id number] Date of Birth: [birth year] Referring Provider (PT): Dr. Allyn Kenner   Encounter Date: 01/02/2019  PT End of Session - 01/02/19 1117    Visit Number  64   progress note completed visit #47   Number of Visits  61    Date for PT Re-Evaluation  12/22/18    Authorization Type  Medicare Part A & B    Authorization Time Period  08/03/18- 09/02/18; 09/04/2018 - 10/02/2018; 09/25/18-10/16/18; 10/16/18-10/30/18; 10/30/18-11/27/18; 11/24/2018-12/22/2018; NEW: 12/26/18-01/09/19    Authorization - Visit Number  1    Authorization - Number of Visits  10    PT Start Time  1028    PT Stop Time  1105    PT Time Calculation (min)  37 min    Activity Tolerance  Patient tolerated treatment well    Behavior During Therapy  [facility] for tasks assessed/performed       Past Medical History:  Diagnosis Date  . Osteoporosis   . Rheumatoid arthritis [facility])     Past Surgical History:  Procedure Laterality Date  . INTRAMEDULLARY (IM) NAIL INTERTROCHANTERIC Right 03/14/2015   Procedure: RIGHT INTERTROCHANTRIC INTRAMEDULLARY (IM) NAIL ;  Surgeon: Mcarthur Rossetti, MD;  Location: [place];  Service: Orthopedics;  Laterality: Right;    There were no vitals filed for this visit.   Subjective Assessment - 01/02/19 1058    Subjective  Pt states she is so frustrated.  states her leg started bleeding all the sudden and she didn't do anything to it.                Wound Therapy - 01/02/19 1100    Subjective  Pt states she is so frustrated.  states her leg started bleeding all the sudden and she didn't do anything to it.      Patient and Family Stated Goals  wound to heal     Date of Onset  05/05/18    Prior Treatments  self care, antibiotics     Evaluation and Treatment Procedures Explained to  Patient/Family  Yes    Evaluation and Treatment Procedures  agreed to    Wound Properties Date First Assessed: 08/03/18 Time First Assessed: 0949 Wound Type: Puncture Location: Leg Location Orientation: Right Present on Admission: Yes   Dressing Type  Gauze (Comment);Silver hydrofiber;Impregnated gauze (bismuth);Alginate    Dressing Changed  Changed    Dressing Status  Old drainage    Dressing Change Frequency  PRN    Site / Wound Assessment  Granulation tissue    % Wound base Red or Granulating  95%    % Wound base Black/Eschar  5%    Peri-wound Assessment  Intact;Edema    Wound Length (cm)  2 cm    Wound Width (cm)  1 cm    Wound Depth (cm)  0.1 cm    Wound Volume (cm^3)  0.2 cm^3    Wound Surface Area (cm^2)  2 cm^2    Margins  Attached edges (approximated)    Drainage Amount  Minimal    Drainage Description  Serosanguineous    Treatment  Cleansed;Debridement (Selective)    Wound Properties Date First Assessed: 01/02/19 Time First Assessed: 1109 Wound Type: Other (Comment) Location: Leg Location Orientation: Left Wound Description (Comments): superior to initial wound Present on Admission: No  Dressing Type  Alginate    Dressing Changed  Changed    Dressing Status  Clean;Dry;Intact    % Wound base Red or Granulating  90%    % Wound base Yellow/Fibrinous Exudate  10%    Peri-wound Assessment  Erythema (blanchable)    Wound Length (cm)  0.8 cm    Wound Width (cm)  1 cm    Wound Depth (cm)  0.1 cm    Wound Volume (cm^3)  0.08 cm^3    Wound Surface Area (cm^2)  0.8 cm^2    Drainage Amount  Minimal    Drainage Description  Serosanguineous    Treatment  Cleansed;Debridement (Selective)    Wound Properties Date First Assessed: 01/02/19 Time First Assessed: 1115 Wound Type: Other (Comment) Location: Leg Location Orientation: Left Wound Description (Comments): lateral to initial wound Present on Admission: No   Dressing Type  Alginate    Dressing Changed  Changed    Dressing Status   Clean;Dry;Intact    Dressing Change Frequency  PRN    Site / Wound Assessment  Friable    % Wound base Red or Granulating  90%    % Wound base Yellow/Fibrinous Exudate  10%    Peri-wound Assessment  Erythema (blanchable)    Wound Length (cm)  0.4 cm    Wound Width (cm)  0.6 cm    Wound Depth (cm)  0.1 cm    Wound Volume (cm^3)  0.02 cm^3    Wound Surface Area (cm^2)  0.24 cm^2    Drainage Amount  Minimal    Drainage Description  Serosanguineous    Treatment  Cleansed;Debridement (Selective)    Incision Properties Date First Assessed: 03/14/15 Time First Assessed: 2208 Location: Hip Location Orientation: Right   Incision Properties Date First Assessed: 01/02/19 Time First Assessed: 1112 Location: Leg Location Orientation: Left Present on Admission: No Final Assessment Date: 01/02/19 Final Assessment Time: 1114   Selective Debridement - Location  wound bed, perimeter    Selective Debridement - Tools Used  Forceps    Selective Debridement - Tissue Removed  slough and devitalized tissues     Wound Therapy - Clinical Statement  Pt with noted frustration this session.  Removed bandage to reveal 2 additional wounds laterally and superiorly to initial wound.  Also with multiple small pin-like areas perimeter of larger wound and noted irritation perimeter.  Cleansed area well and debrided devitatized tissue from perimeter.  Began laser treatment to wound area overlapping in 4 treatments.      Hydrotherapy Plan  Debridement;Dressing change;Patient/family education;Pulsatile lavage with suction    Wound Therapy - Frequency  2X / week    Wound Therapy - Current Recommendations  PT    Wound Plan  Discuss treatment options with evaluating therapist.  Unsure if she needs to return to MD or be referred to dermatologist at this point/change dressings/cultures??.      Dressing   silver hydrofiber, 2x2, medipore tape    Modality  laser treatment for mm/tendon chronic continuous 4 sites 1:24" at [place] - 11/24/18 1057      PT SHORT TERM GOAL #1   Title  PT wound to be 100% granulated to decrease cellulitis    Time  2    Period  Weeks    Status  On-going    Target Date  08/17/18      PT SHORT TERM  GOAL #2   Title  Pt pain to be no greater than a 4/10 to allow pt to be able to tolerate standing/walking for up  to 30 minutes without increased pain for improved functional ability.     Time  2    Period  Weeks    Status  Achieved        PT Long Term Goals - 11/24/18 1057      PT LONG TERM GOAL #1   Title  Wound size to be decreased to no greater than 1.5x1.5 cm to allow pt to feel comfortable to complete self care for wound.     Time  4    Period  Weeks    Status  Achieved      PT LONG TERM GOAL #2   Title  Pt to be able to vocalize the benefits of wearing compression garments for varicosities.     Time  4    Period  Weeks    Status  On-going              Patient will benefit from skilled therapeutic intervention in order to improve the following deficits and impairments:     Visit Diagnosis: 1. Open leg wound, right, sequela        Problem List Patient Active Problem List   Diagnosis Date Noted  . Rash and nonspecific skin eruption 04/06/2015  . Acute blood loss anemia 03/22/2015  . Edema 03/21/2015  . Acute renal failure syndrome ([place])   . Closed right hip fracture ([place]) 03/14/2015  . Rheumatoid arthritis ([place]) 03/14/2015  . Fall 03/14/2015   Teena Irani, PTA/CLT [phone]  Teena Irani 01/02/2019, 11:18 AM  [place], [name], [address] Phone: [phone]   Fax:  [phone]  Name: Michaela Simpson MRN: [id number] Date of Birth: [birth year]

## 2019-01-03 ENCOUNTER — Telehealth (HOSPITAL_COMMUNITY): Payer: Self-pay

## 2019-01-05 ENCOUNTER — Ambulatory Visit (HOSPITAL_COMMUNITY): Payer: Medicare Other

## 2019-01-05 ENCOUNTER — Encounter (HOSPITAL_COMMUNITY): Payer: Self-pay

## 2019-01-05 ENCOUNTER — Other Ambulatory Visit: Payer: Self-pay

## 2019-01-05 DIAGNOSIS — M79661 Pain in right lower leg: Secondary | ICD-10-CM

## 2019-01-05 DIAGNOSIS — S81801S Unspecified open wound, right lower leg, sequela: Secondary | ICD-10-CM | POA: Diagnosis not present

## 2019-01-05 NOTE — Telephone Encounter (Signed)
I called Michaela Simpson at her home number to discuss her ongoing changes and wound concerns. I informed her I had spoken with her MD's office and that we are going to try a new wound dressing that is collagen based to promote healing. I informed her that Dr. Juel Burrow office is going to place a prescription for the new wound dressing and she should try to pick it up before her appointment on Friday. She was agreeable to this plan.   Kipp Brood, PT, DPT, [facility] Physical Therapist with [facility]

## 2019-01-05 NOTE — Therapy (Signed)
[place], [name], [address] Phone: [phone]   Fax:  [phone]  Wound Care Therapy  Patient Details  Name: Michaela Simpson MRN: [id number] Date of Birth: May 19, 1932 Referring Provider (PT): Dr. Allyn Kenner   Encounter Date: 01/05/2019  PT End of Session - 01/05/19 1145    Visit Number  59   progress note completed visit #47   Number of Visits  61    Date for PT Re-Evaluation  12/22/18    Authorization Type  Medicare Part A & B    Authorization Time Period  08/03/18- 09/02/18; 09/04/2018 - 10/02/2018; 09/25/18-10/16/18; 10/16/18-10/30/18; 10/30/18-11/27/18; 11/24/2018-12/22/2018; NEW: 12/26/18-01/09/19    Authorization - Visit Number  2    Authorization - Number of Visits  10    PT Start Time  0915    PT Stop Time  1000    PT Time Calculation (min)  45 min    Activity Tolerance  Patient tolerated treatment well    Behavior During Therapy  [facility] for tasks assessed/performed       Past Medical History:  Diagnosis Date  . Osteoporosis   . Rheumatoid arthritis [facility])     Past Surgical History:  Procedure Laterality Date  . INTRAMEDULLARY (IM) NAIL INTERTROCHANTERIC Right 03/14/2015   Procedure: RIGHT INTERTROCHANTRIC INTRAMEDULLARY (IM) NAIL ;  Surgeon: Mcarthur Rossetti, MD;  Location: [place];  Service: Orthopedics;  Laterality: Right;    There were no vitals filed for this visit.    Wound Therapy - 01/05/19 1123    Subjective  Patient reports she is feeling better than she was Tuesday. She is still frustrated and has been tearful the last few days. She tried to go to her pharmacy to pick up her new wound dressing but they had not recieved the prescription for it. She was unable to find it yesterday and arrived without it.     Patient and Family Stated Goals  wound to heal     Date of Onset  05/05/18    Prior Treatments  self care, antibiotics     Pain Scale  0-10    Pain Score  0-No pain    Evaluation and  Treatment Procedures Explained to Patient/Family  Yes    Evaluation and Treatment Procedures  agreed to    Wound Properties Date First Assessed: 08/03/18 Time First Assessed: 0949 Wound Type: Puncture Location: Leg Location Orientation: Right Present on Admission: Yes   Dressing Type  Gauze (Comment);Silver hydrofiber;Impregnated gauze (bismuth);Alginate    Dressing Changed  Changed    Dressing Status  Old drainage    Dressing Change Frequency  PRN    Site / Wound Assessment  Granulation tissue    % Wound base Red or Granulating  90%    % Wound base Yellow/Fibrinous Exudate  10%    % Wound base Black/Eschar  0%    Peri-wound Assessment  Intact;Edema    Wound Length (cm)  1.4 cm    Wound Width (cm)  0.5 cm    Wound Depth (cm)  0.3 cm    Wound Volume (cm^3)  0.21 cm^3    Wound Surface Area (cm^2)  0.7 cm^2    Margins  Attached edges (approximated)    Drainage Amount  Minimal    Drainage Description  Serosanguineous    Treatment  Cleansed;Debridement (Selective)    Wound Properties Date First Assessed: 01/02/19 Time First Assessed: 1115 Wound Type: Other (Comment) Location: Leg Location Orientation: Left  Wound Description (Comments): lateral to initial wound Present on Admission: No   Dressing Type  --   none applied as it appears healed over   Dressing Changed  Changed    Dressing Status  Clean;Dry;Intact    Dressing Change Frequency  PRN    Site / Wound Assessment  Friable    % Wound base Red or Granulating  0%    % Wound base Yellow/Fibrinous Exudate  0%    % Wound base Black/Eschar  5%    % Wound base Other/Granulation Tissue (Comment)  100%   new epithelialized skin   Peri-wound Assessment  Erythema (blanchable)    Wound Length (cm)  0 cm    Wound Width (cm)  0 cm    Wound Depth (cm)  0 cm    Wound Volume (cm^3)  0 cm^3    Wound Surface Area (cm^2)  0 cm^2    Drainage Amount  None    Drainage Description  --    Treatment  Cleansed;Debridement (Selective)    Wound  Properties Date First Assessed: 01/02/19 Time First Assessed: 1109 Wound Type: Other (Comment) Location: Leg Location Orientation: Left Wound Description (Comments): superior to initial wound Present on Admission: No   Dressing Type  Silver hydrofiber    Dressing Changed  Changed    Dressing Status  Clean;Dry;Intact    % Wound base Red or Granulating  90%    % Wound base Yellow/Fibrinous Exudate  10%    Peri-wound Assessment  Erythema (blanchable)    Wound Length (cm)  1 cm    Wound Width (cm)  0.8 cm    Wound Depth (cm)  0.1 cm    Wound Volume (cm^3)  0.08 cm^3    Wound Surface Area (cm^2)  0.8 cm^2    Drainage Amount  Minimal    Drainage Description  Serosanguineous    Treatment  Cleansed;Debridement (Selective)    Selective Debridement - Location  wound bed, perimeter    Selective Debridement - Tools Used  Forceps    Selective Debridement - Tissue Removed  slough and devitalized tissues     Wound Therapy - Clinical Statement  Patient's wound with reduction in size since last session 3 days ago. The most lateral wound that had developed appears to have fully healed and following scab removal had new epithelialized skin present. The more superior wound has not reduced in size and required debridement of the margins and dry devitalized tissue of the surrounding area. The larger wound remains deeper with small areas of adhered slough in the wound bed that required debridement. Successfully removed dry devitalized tissue from margins of this wound and from areas of periwound. This revealed small spot/pocket medial to large wound that is pink in coloration and is 0.1x0.1x0 in size. Continued laser treatment to wound area and dressed with silver hydrofiber dressing this date as patient was unable to obtain the new collagen dressing. This Therapist called the pharmacy and they were unable to order it and informed the doctor of such. This therapist will reach out to pt's MD and determine appropriate  adjustments to POC.     Hydrotherapy Plan  Debridement;Dressing change;Patient/family education;Pulsatile lavage with suction    Wound Therapy - Frequency  2X / week    Wound Therapy - Current Recommendations  PT    Wound Plan  Follow up with MD about new dressing and continue to take pictures and send to Dr. Durene Cal office. Follow up about antibiotics. Continue cleansing, debridement  and proper dressing to maintain a healing environment. Continue laser.     Dressing   silver hydrofiber, 2x2, medipore tape    Modality  laser treatment for mm/tendon chronic continuous 4 sites 1:24" at [place] - 11/24/18 1057      PT SHORT TERM GOAL #1   Title  PT wound to be 100% granulated to decrease cellulitis    Time  2    Period  Weeks    Status  On-going    Target Date  08/17/18      PT SHORT TERM GOAL #2   Title  Pt pain to be no greater than a 4/10 to allow pt to be able to tolerate standing/walking for up  to 30 minutes without increased pain for improved functional ability.     Time  2    Period  Weeks    Status  Achieved        PT Long Term Goals - 11/24/18 1057      PT LONG TERM GOAL #1   Title  Wound size to be decreased to no greater than 1.5x1.5 cm to allow pt to feel comfortable to complete self care for wound.     Time  4    Period  Weeks    Status  Achieved      PT LONG TERM GOAL #2   Title  Pt to be able to vocalize the benefits of wearing compression garments for varicosities.     Time  4    Period  Weeks    Status  On-going         Plan - 01/05/19 1146    Clinical Impression Statement  see above    Rehab Potential  Good    PT Frequency  2x / week    PT Duration  4 weeks   4 more   PT Treatment/Interventions  ADLs/Self Care Home Management;Patient/family education;Other (comment)   debridement and dressing change    PT Next Visit Plan  see above    Consulted and Agree with Plan of Care  Patient       Patient will benefit from  skilled therapeutic intervention in order to improve the following deficits and impairments:  Decreased skin integrity, Pain  Visit Diagnosis: 1. Open leg wound, right, sequela   2. Pain in right lower leg        Problem List Patient Active Problem List   Diagnosis Date Noted  . Rash and nonspecific skin eruption 04/06/2015  . Acute blood loss anemia 03/22/2015  . Edema 03/21/2015  . Acute renal failure syndrome ([place])   . Closed right hip fracture ([place]) 03/14/2015  . Rheumatoid arthritis ([place]) 03/14/2015  . Fall 03/14/2015    Kipp Brood, PT, DPT, [facility] Physical Therapist with [facility]  01/05/2019 11:47 AM    [place] [address] [place], [name], [address] Phone: [phone]   Fax:  [phone]  Name: ARLITA BUFFKIN MRN: [id number] Date of Birth: April 04, 1932

## 2019-01-08 ENCOUNTER — Ambulatory Visit (HOSPITAL_COMMUNITY): Payer: Medicare Other

## 2019-01-08 ENCOUNTER — Encounter (HOSPITAL_COMMUNITY): Payer: Self-pay

## 2019-01-08 ENCOUNTER — Other Ambulatory Visit: Payer: Self-pay

## 2019-01-08 DIAGNOSIS — S81801S Unspecified open wound, right lower leg, sequela: Secondary | ICD-10-CM

## 2019-01-08 DIAGNOSIS — M79661 Pain in right lower leg: Secondary | ICD-10-CM | POA: Diagnosis not present

## 2019-01-08 NOTE — Therapy (Signed)
[place], [name], [address] Phone: [phone]   Fax:  [phone]  Wound Care Therapy  Patient Details  Name: Michaela Simpson MRN: [id number] Date of Birth: Aug 18, 1931 Referring Provider (PT): Dr. Allyn Kenner   Encounter Date: 01/08/2019  PT End of Session - 01/08/19 1152    Visit Number  50   progress note completed visit #47   Number of Visits  61    Date for PT Re-Evaluation  12/22/18    Authorization Type  Medicare Part A & B    Authorization Time Period  08/03/18- 09/02/18; 09/04/2018 - 10/02/2018; 09/25/18-10/16/18; 10/16/18-10/30/18; 10/30/18-11/27/18; 11/24/2018-12/22/2018; NEW: 12/26/18-01/09/19    Authorization - Visit Number  3    Authorization - Number of Visits  10    PT Start Time  1056    PT Stop Time  1137    PT Time Calculation (min)  41 min    Activity Tolerance  Patient tolerated treatment well    Behavior During Therapy  [facility] for tasks assessed/performed       Past Medical History:  Diagnosis Date  . Osteoporosis   . Rheumatoid arthritis [facility])     Past Surgical History:  Procedure Laterality Date  . INTRAMEDULLARY (IM) NAIL INTERTROCHANTERIC Right 03/14/2015   Procedure: RIGHT INTERTROCHANTRIC INTRAMEDULLARY (IM) NAIL ;  Surgeon: Mcarthur Rossetti, MD;  Location: [place];  Service: Orthopedics;  Laterality: Right;    There were no vitals filed for this visit.   Wound Therapy - 01/08/19 1142    Subjective  Patient reports she did not have pain this weekend but she felt like her wound was irritated and she feels a scratchy sensation like a tag that is bothering you from a shirt.     Patient and Family Stated Goals  wound to heal     Date of Onset  05/05/18    Prior Treatments  self care, antibiotics     Pain Scale  0-10    Pain Score  0-No pain    Evaluation and Treatment Procedures Explained to Patient/Family  Yes    Evaluation and Treatment Procedures  agreed to    Wound Properties Date  First Assessed: 08/03/18 Time First Assessed: 0949 Wound Type: Puncture Location: Leg Location Orientation: Right Present on Admission: Yes   Dressing Type  Gauze (Comment);Silver hydrofiber;Impregnated gauze (bismuth);Alginate    Dressing Changed  Changed    Dressing Status  Old drainage    Dressing Change Frequency  PRN    Site / Wound Assessment  Granulation tissue    % Wound base Red or Granulating  90%    % Wound base Yellow/Fibrinous Exudate  10%    % Wound base Black/Eschar  0%    Peri-wound Assessment  Intact;Edema    Margins  Attached edges (approximated)    Drainage Amount  Minimal    Drainage Description  Serosanguineous    Treatment  Cleansed;Debridement (Selective)    Wound Properties Date First Assessed: 01/02/19 Time First Assessed: 1115 Wound Type: Other (Comment) Location: Leg Location Orientation: Left Wound Description (Comments): lateral to initial wound Present on Admission: No   Dressing Type  --   none applied as it appears healed over   Dressing Status  Clean;Dry;Intact    Dressing Change Frequency  PRN    Site / Wound Assessment  Friable    % Wound base Red or Granulating  0%    % Wound base Yellow/Fibrinous Exudate  0%    %  Wound base Black/Eschar  5%    % Wound base Other/Granulation Tissue (Comment)  100%   new epithelialized skin   Peri-wound Assessment  Erythema (blanchable)    Drainage Amount  None    Wound Properties Date First Assessed: 01/02/19 Time First Assessed: 1109 Wound Type: Other (Comment) Location: Leg Location Orientation: Left Wound Description (Comments): superior to initial wound Present on Admission: No   Dressing Type  Silver hydrofiber    Dressing Changed  Changed    Dressing Status  Clean;Dry;Intact    % Wound base Red or Granulating  90%    % Wound base Yellow/Fibrinous Exudate  10%    Peri-wound Assessment  Erythema (blanchable)    Drainage Amount  Minimal    Drainage Description  Serosanguineous    Treatment   Cleansed;Debridement (Selective)    Selective Debridement - Location  wound bed, perimeter    Selective Debridement - Tools Used  Forceps    Selective Debridement - Tissue Removed  slough and devitalized tissues     Wound Therapy - Clinical Statement  Patient's wound appears slightly reduced in size for both superior and main larger wound. Cleansed and debrided margins or wounds successfully. Larger inferior wound edges are epiboled limiting the approximation of the wound. Will continue to monitor and explained to patient possible need for treatment by MD to perform silver nitrate around margins. Patient continues to present with slightly purplish/pink periwound and will benefit from antibiotics. She still has not received a prescription for the new wound dressing and this therapist will continue to follow up with MD office to address. Continued laser treatment to wound area and dressed with silver hydrofiber dressing.    Hydrotherapy Plan  Debridement;Dressing change;Patient/family education;Pulsatile lavage with suction    Wound Therapy - Frequency  2X / week    Wound Therapy - Current Recommendations  PT    Wound Plan  Continue to follow up with MD about new dressing and continue to take pictures and send to Dr. Durene Cal office. Follow up about antibiotics. Continue cleansing, debridement and proper dressing to maintain a healing environment. Continue laser.     Dressing   silver hydrofiber, 2x2, medipore tape    Modality  laser treatment for mm/tendon chronic continuous 4 sites 1:24" at [place] - 11/24/18 1057      PT SHORT TERM GOAL #1   Title  PT wound to be 100% granulated to decrease cellulitis    Time  2    Period  Weeks    Status  On-going    Target Date  08/17/18      PT SHORT TERM GOAL #2   Title  Pt pain to be no greater than a 4/10 to allow pt to be able to tolerate standing/walking for up  to 30 minutes without increased pain for improved functional  ability.     Time  2    Period  Weeks    Status  Achieved        PT Long Term Goals - 11/24/18 1057      PT LONG TERM GOAL #1   Title  Wound size to be decreased to no greater than 1.5x1.5 cm to allow pt to feel comfortable to complete self care for wound.     Time  4    Period  Weeks    Status  Achieved      PT LONG TERM GOAL #2  Title  Pt to be able to vocalize the benefits of wearing compression garments for varicosities.     Time  4    Period  Weeks    Status  On-going        Plan - 01/08/19 1153    Clinical Impression Statement  see above    Rehab Potential  Good    PT Frequency  2x / week    PT Duration  4 weeks   4 more   PT Treatment/Interventions  ADLs/Self Care Home Management;Patient/family education;Other (comment)   debridement and dressing change    PT Next Visit Plan  see above    Consulted and Agree with Plan of Care  Patient       Patient will benefit from skilled therapeutic intervention in order to improve the following deficits and impairments:  Decreased skin integrity, Pain  Visit Diagnosis: 1. Open leg wound, right, sequela   2. Pain in right lower leg        Problem List Patient Active Problem List   Diagnosis Date Noted  . Rash and nonspecific skin eruption 04/06/2015  . Acute blood loss anemia 03/22/2015  . Edema 03/21/2015  . Acute renal failure syndrome ([place])   . Closed right hip fracture ([place]) 03/14/2015  . Rheumatoid arthritis ([place]) 03/14/2015  . Fall 03/14/2015    Kipp Brood, PT, DPT, [facility] Physical Therapist with [facility]  01/08/2019 11:53 AM     [place] [address] [place], [name], [address] Phone: [phone]   Fax:  [phone]  Name: Michaela Simpson MRN: [id number] Date of Birth: 03/03/1932

## 2019-01-09 ENCOUNTER — Encounter

## 2019-01-10 ENCOUNTER — Encounter (HOSPITAL_COMMUNITY): Payer: Self-pay

## 2019-01-10 ENCOUNTER — Other Ambulatory Visit: Payer: Self-pay

## 2019-01-10 ENCOUNTER — Ambulatory Visit (HOSPITAL_COMMUNITY): Payer: Medicare Other

## 2019-01-10 DIAGNOSIS — L97911 Non-pressure chronic ulcer of unspecified part of right lower leg limited to breakdown of skin: Secondary | ICD-10-CM | POA: Diagnosis not present

## 2019-01-10 DIAGNOSIS — S81801S Unspecified open wound, right lower leg, sequela: Secondary | ICD-10-CM

## 2019-01-10 DIAGNOSIS — M79661 Pain in right lower leg: Secondary | ICD-10-CM

## 2019-01-10 NOTE — Therapy (Signed)
[place], [name], [address] Phone: [phone]   Fax:  [phone]  Wound Care Therapy  Patient Details  Name: Michaela Simpson MRN: [id number] Date of Birth: 06/12/1932 Referring Provider (PT): Dr. Allyn Kenner   Encounter Date: 01/10/2019  PT End of Session - 01/10/19 1539    Visit Number  45   progress note completed visit #47   Number of Visits  73    Date for PT Re-Evaluation  02/21/19    Authorization Type  Medicare Part A & B    Authorization Time Period  08/03/18- 09/02/18; 09/04/2018 - 10/02/2018; 09/25/18-10/16/18; 10/16/18-10/30/18; 10/30/18-11/27/18; 11/24/2018-12/22/2018; NEW: 12/26/18-01/09/19; 01/10/19-02/21/19    Authorization - Visit Number  1    Authorization - Number of Visits  10    PT Start Time  1010    PT Stop Time  1050    PT Time Calculation (min)  40 min    Activity Tolerance  Patient tolerated treatment well    Behavior During Therapy  [facility] for tasks assessed/performed       Past Medical History:  Diagnosis Date  . Osteoporosis   . Rheumatoid arthritis [facility])     Past Surgical History:  Procedure Laterality Date  . INTRAMEDULLARY (IM) NAIL INTERTROCHANTERIC Right 03/14/2015   Procedure: RIGHT INTERTROCHANTRIC INTRAMEDULLARY (IM) NAIL ;  Surgeon: Mcarthur Rossetti, MD;  Location: [place];  Service: Orthopedics;  Laterality: Right;    There were no vitals filed for this visit.    Wound Therapy - 01/10/19 1311    Subjective  Patient reports she is doing well overall and is not having any pain. She is very hopeful that her wound is smaller.     Patient and Family Stated Goals  wound to heal     Date of Onset  05/05/18    Prior Treatments  self care, antibiotics     Pain Scale  0-10    Pain Score  0-No pain    Evaluation and Treatment Procedures Explained to Patient/Family  Yes    Evaluation and Treatment Procedures  agreed to    Wound Properties Date First Assessed: 08/03/18 Time First  Assessed: 0949 Wound Type: Puncture Location: Leg Location Orientation: Right Present on Admission: Yes   Dressing Type  Gauze (Comment);Silver hydrofiber;Impregnated gauze (bismuth);Alginate    Dressing Changed  Changed    Dressing Status  Old drainage    Dressing Change Frequency  PRN    Site / Wound Assessment  Granulation tissue    % Wound base Red or Granulating  90%    % Wound base Yellow/Fibrinous Exudate  10%    % Wound base Black/Eschar  0%    Peri-wound Assessment  Intact;Edema    Wound Length (cm)  1.3 cm    Wound Width (cm)  0.4 cm    Wound Depth (cm)  0.3 cm    Wound Volume (cm^3)  0.16 cm^3    Wound Surface Area (cm^2)  0.52 cm^2    Margins  Attached edges (approximated)    Drainage Amount  Minimal    Drainage Description  Sanguineous    Treatment  Cleansed;Debridement (Selective)    Wound Properties Date First Assessed: 01/02/19 Time First Assessed: 1115 Wound Type: Other (Comment) Location: Leg Location Orientation: Left Wound Description (Comments): lateral to initial wound Present on Admission: No Final Assessment Date: 01/05/19 Final Assessment Time: 1115   Dressing Type  --   none applied as it appears healed  over   Dressing Status  Clean;Dry;Intact    Dressing Change Frequency  PRN    Site / Wound Assessment  Friable    % Wound base Red or Granulating  0%    % Wound base Yellow/Fibrinous Exudate  0%    % Wound base Black/Eschar  5%    % Wound base Other/Granulation Tissue (Comment)  100%   new epithelialized skin   Peri-wound Assessment  Erythema (blanchable)    Wound Length (cm)  0 cm    Wound Width (cm)  0 cm    Wound Depth (cm)  0 cm    Wound Volume (cm^3)  0 cm^3    Wound Surface Area (cm^2)  0 cm^2    Drainage Amount  None    Wound Properties Date First Assessed: 01/02/19 Time First Assessed: 1109 Wound Type: Other (Comment) Location: Leg Location Orientation: Left Wound Description (Comments): superior to initial wound Present on Admission: No    Dressing Type  Silver hydrofiber    Dressing Changed  Changed    Dressing Status  Clean;Dry;Intact    % Wound base Red or Granulating  100%    % Wound base Yellow/Fibrinous Exudate  0%    % Wound base Black/Eschar  0%    Peri-wound Assessment  Erythema (blanchable)    Wound Length (cm)  0.4 cm    Wound Width (cm)  0.3 cm    Wound Depth (cm)  0.1 cm    Wound Volume (cm^3)  0.01 cm^3    Wound Surface Area (cm^2)  0.12 cm^2    Drainage Amount  Minimal    Drainage Description  Serosanguineous    Treatment  Cleansed;Debridement (Selective)    Selective Debridement - Location  wound bed, perimeter    Selective Debridement - Tools Used  Forceps    Selective Debridement - Tissue Removed  slough and devitalized tissues     Wound Therapy - Clinical Statement  Patient's wounds have both reduced in size. Her superior wound is greatly reduced compared to prior session and margins was easily debrided to remove dried exudate at edges. After cleansing superior wound dressed with xeroform as primary contact dressing with alginate over top to absorb excess drainage. Hydrofiber directly to wound has been adhering to margins. Inferior main wound remains larger with very slight reduction in size. Entire margin of wound appears epiboled and difficult to assess if undermining is present dur to patient sensitivity. Drainage of inferior wound is now sanguineous. Patient would benefit from silver nitrate to treat ebipoled margins and this therapist called PCP's office and arranged an appointment today for her to receive treatment. She agreed to go in at 2:20 PM for treatment and an appointment was made for wound care tomorrow to debride tissue from treatment.     Hydrotherapy Plan  Debridement;Dressing change;Patient/family education;Pulsatile lavage with suction    Wound Therapy - Frequency  2X / week    Wound Therapy - Current Recommendations  PT    Wound Plan  Sleanse/debride wound form silver nitrate treatment with  MD if patient able to have done at office. Follow up about antibiotics to manage any infection present in the wound. Continue with laser treatment.     Dressing   xeroform, silver hydrofiber, 2x2, medipore tape    Modality  laser treatment for mm/tendon chronic continuous 4 sites 1:24" at [place]        PT Education - 01/10/19 1537    Education Details  Educated on appointment for  silver nitrate at MD's office and benefit of intervention. Educated on plan to add treatment session for tomorrow. Asked patient to request antibiotics to manage any infection present in wound.    Person(s) Educated  Patient    Methods  Explanation    Comprehension  Verbalized understanding       PT Short Term Goals - 01/10/19 1547      PT SHORT TERM GOAL #1   Title  PT wound to be 100% granulated to decrease cellulitis    Time  2    Period  Weeks    Status  On-going    Target Date  08/17/18      PT SHORT TERM GOAL #2   Title  Pt pain to be no greater than a 4/10 to allow pt to be able to tolerate standing/walking for up  to 30 minutes without increased pain for improved functional ability.     Time  2    Period  Weeks    Status  Achieved      PT SHORT TERM GOAL #3   Title  Patient will recieve silver nitrate to treat inferior wound margins to improve wound approximation    Time  2    Period  Days    Status  New    Target Date  01/12/19        PT Long Term Goals - 01/10/19 1546      PT LONG TERM GOAL #1   Title  Wound size to be decreased to no greater than 1.5x1.5 cm to allow pt to feel comfortable to complete self care for wound.     Time  4    Period  Weeks    Status  Achieved      PT LONG TERM GOAL #2   Title  Pt to be able to vocalize the benefits of wearing compression garments for varicosities.     Time  4    Period  Weeks    Status  On-going      PT LONG TERM GOAL #3   Title  Patient's wounds to fully approximate with no signs of infection or decreased skin integrity.    Time  6     Period  Weeks    Target Date  02/21/19        Plan - 01/10/19 1542    Clinical Impression Statement  see above    Rehab Potential  Good    PT Frequency  2x / week    PT Duration  6 weeks   4 more   PT Treatment/Interventions  ADLs/Self Care Home Management;Patient/family education;Other (comment)   debridement and dressing change    PT Next Visit Plan  see above    Consulted and Agree with Plan of Care  Patient       Patient will benefit from skilled therapeutic intervention in order to improve the following deficits and impairments:  Decreased skin integrity, Pain  Visit Diagnosis: 1. Open leg wound, right, sequela   2. Pain in right lower leg        Problem List Patient Active Problem List   Diagnosis Date Noted  . Rash and nonspecific skin eruption 04/06/2015  . Acute blood loss anemia 03/22/2015  . Edema 03/21/2015  . Acute renal failure syndrome ([place])   . Closed right hip fracture ([place]) 03/14/2015  . Rheumatoid arthritis ([place]) 03/14/2015  . Fall 03/14/2015    Kipp Brood, PT, DPT, [facility] Physical Therapist with [facility]  [facility]  01/10/2019 3:46 PM    [place] [address] [place], [name], [address] Phone: [phone]   Fax:  [phone]  Name: Michaela Simpson MRN: [id number] Date of Birth: July 19, 1931

## 2019-01-10 NOTE — Patient Instructions (Addendum)
   Appointment at Dr. Juel Burrow office on 01/10/19 at 2:20 PM.  Ask for a local anesthetic before they perform the silver nitrate to the wound.   Silver nitrate would be beneficial to the inferior (bottom) wound to open the epiboled (rounded) edges and allow for it to heel completely.

## 2019-01-11 ENCOUNTER — Encounter (HOSPITAL_COMMUNITY): Payer: Self-pay

## 2019-01-11 ENCOUNTER — Ambulatory Visit (HOSPITAL_COMMUNITY): Payer: Medicare Other

## 2019-01-11 DIAGNOSIS — M79661 Pain in right lower leg: Secondary | ICD-10-CM

## 2019-01-11 DIAGNOSIS — S81801S Unspecified open wound, right lower leg, sequela: Secondary | ICD-10-CM | POA: Diagnosis not present

## 2019-01-11 NOTE — Therapy (Signed)
[place], [name], [address] Phone: [phone]   Fax:  [phone]  Wound Care Therapy  Patient Details  Name: Michaela Simpson MRN: [id number] Date of Birth: [birth year] Referring Provider (PT): Dr. Allyn Kenner   Encounter Date: 01/11/2019  PT End of Session - 01/11/19 0943    Visit Number  82   progress note completed visit #47   Number of Visits  73    Date for PT Re-Evaluation  02/21/19    Authorization Type  Medicare Part A & B    Authorization Time Period  08/03/18- 09/02/18; 09/04/2018 - 10/02/2018; 09/25/18-10/16/18; 10/16/18-10/30/18; 10/30/18-11/27/18; 11/24/2018-12/22/2018; NEW: 12/26/18-01/09/19; 01/10/19-02/21/19    Authorization - Visit Number  2    Authorization - Number of Visits  10    PT Start Time  0910    PT Stop Time  0934    PT Time Calculation (min)  24 min    Activity Tolerance  Patient tolerated treatment well    Behavior During Therapy  [facility] for tasks assessed/performed       Past Medical History:  Diagnosis Date  . Osteoporosis   . Rheumatoid arthritis [facility])     Past Surgical History:  Procedure Laterality Date  . INTRAMEDULLARY (IM) NAIL INTERTROCHANTERIC Right 03/14/2015   Procedure: RIGHT INTERTROCHANTRIC INTRAMEDULLARY (IM) NAIL ;  Surgeon: Mcarthur Rossetti, MD;  Location: [place];  Service: Orthopedics;  Laterality: Right;    There were no vitals filed for this visit.     Wound Therapy - 01/11/19 0943    Subjective  Patient arrives for extra treatment this week as she had silver nitrate treatment performed to wound yesterday to re-open margins. She was presrcibed another round of antibiotics and is going to pick them up today.     Patient and Family Stated Goals  wound to heal     Date of Onset  05/05/18    Prior Treatments  self care, antibiotics     Pain Scale  0-10    Pain Score  0-No pain    Evaluation and Treatment Procedures Explained to Patient/Family  Yes    Evaluation and  Treatment Procedures  agreed to    Wound Properties Date First Assessed: 08/03/18 Time First Assessed: 0949 Wound Type: Puncture Location: Leg Location Orientation: Right Present on Admission: Yes   Dressing Type  Gauze (Comment);Impregnated gauze (bismuth);Alginate    Dressing Changed  Changed    Dressing Status  Old drainage    Dressing Change Frequency  PRN    Site / Wound Assessment  Granulation tissue    % Wound base Red or Granulating  90%    % Wound base Yellow/Fibrinous Exudate  10%    % Wound base Black/Eschar  0%    Peri-wound Assessment  Intact;Edema    Margins  Attached edges (approximated)    Drainage Amount  Minimal    Drainage Description  Sanguineous    Treatment  Cleansed;Debridement (Selective)    Wound Properties Date First Assessed: 01/02/19 Time First Assessed: 1109 Wound Type: Other (Comment) Location: Leg Location Orientation: Left Wound Description (Comments): superior to initial wound Present on Admission: No   Dressing Type  Alginate    Dressing Changed  Changed    Dressing Status  Clean;Dry;Intact    % Wound base Red or Granulating  100%    % Wound base Yellow/Fibrinous Exudate  0%    % Wound base Black/Eschar  0%    Peri-wound Assessment  Erythema (blanchable);Maceration    Drainage Amount  Minimal    Drainage Description  Serosanguineous    Treatment  Cleansed;Debridement (Selective)    Selective Debridement - Location  wound bed, perimeter    Selective Debridement - Tools Used  Forceps    Selective Debridement - Tissue Removed  slough and devitalized tissues     Wound Therapy - Clinical Statement  Patient returns with no major change to superior wound but slight maceration noted around periwound. After cleansing dressed with alginate to manage drainage, did not return to silver hydrofiber as patient is beginning antibiotics today. Inferior wound margins have been treated with silver nitrate and majority of wound margins appear re-opened however some aspects  along lateral boarder appear to still have slight epiboled edge to it. Debrided slough from wound and margins to maintain open edges and dressed with xeroform as majority of wound bed is red granular tissue. Followed by alginate to manage drainage. She will continue to benefit from skilled wound care to promote healing.     Hydrotherapy Plan  Debridement;Dressing change;Patient/family education;Pulsatile lavage with suction    Wound Therapy - Frequency  2X / week    Wound Therapy - Current Recommendations  PT    Wound Plan  Cleanse and debride as need, pt sensitive at edges but really need to maintain open wound amrgins. Continue with laser treatment.     Dressing   Inferior: xeroform, alginate, 2x2, medipore tape    Dressing  Superior: xeroform, alginate, 2x2, medipore tape    Modality  laser treatment for mm/tendon chronic continuous 4 sites 1:24" at [place] - 01/10/19 1547      PT SHORT TERM GOAL #1   Title  PT wound to be 100% granulated to decrease cellulitis    Time  2    Period  Weeks    Status  On-going    Target Date  08/17/18      PT SHORT TERM GOAL #2   Title  Pt pain to be no greater than a 4/10 to allow pt to be able to tolerate standing/walking for up  to 30 minutes without increased pain for improved functional ability.     Time  2    Period  Weeks    Status  Achieved      PT SHORT TERM GOAL #3   Title  Patient will recieve silver nitrate to treat inferior wound margins to improve wound approximation    Time  2    Period  Days    Status  New    Target Date  01/12/19        PT Long Term Goals - 01/10/19 1546      PT LONG TERM GOAL #1   Title  Wound size to be decreased to no greater than 1.5x1.5 cm to allow pt to feel comfortable to complete self care for wound.     Time  4    Period  Weeks    Status  Achieved      PT LONG TERM GOAL #2   Title  Pt to be able to vocalize the benefits of wearing compression garments for varicosities.      Time  4    Period  Weeks    Status  On-going      PT LONG TERM GOAL #3   Title  Patient's wounds to fully approximate with no signs of infection or decreased  skin integrity.    Time  6    Period  Weeks    Target Date  02/21/19        Plan - 01/11/19 0943    Clinical Impression Statement  see abve    Rehab Potential  Good    PT Frequency  2x / week    PT Duration  6 weeks   4 more   PT Treatment/Interventions  ADLs/Self Care Home Management;Patient/family education;Other (comment)   debridement and dressing change    PT Next Visit Plan  see above    Consulted and Agree with Plan of Care  Patient       Patient will benefit from skilled therapeutic intervention in order to improve the following deficits and impairments:  Decreased skin integrity, Pain  Visit Diagnosis: 1. Open leg wound, right, sequela   2. Pain in right lower leg        Problem List Patient Active Problem List   Diagnosis Date Noted  . Rash and nonspecific skin eruption 04/06/2015  . Acute blood loss anemia 03/22/2015  . Edema 03/21/2015  . Acute renal failure syndrome ([place])   . Closed right hip fracture ([place]) 03/14/2015  . Rheumatoid arthritis ([place]) 03/14/2015  . Fall 03/14/2015    Kipp Brood, PT, DPT, [facility] Physical Therapist with [facility]  01/11/2019 9:55 AM    [place] [address] [place], [name], [address] Phone: [phone]   Fax:  [phone]  Name: Michaela Simpson MRN: [id number] Date of Birth: 1931-12-01

## 2019-01-16 ENCOUNTER — Ambulatory Visit (HOSPITAL_COMMUNITY): Payer: Medicare Other | Admitting: Physical Therapy

## 2019-01-16 ENCOUNTER — Other Ambulatory Visit: Payer: Self-pay

## 2019-01-16 DIAGNOSIS — S81801S Unspecified open wound, right lower leg, sequela: Secondary | ICD-10-CM | POA: Diagnosis not present

## 2019-01-16 DIAGNOSIS — M79661 Pain in right lower leg: Secondary | ICD-10-CM | POA: Diagnosis not present

## 2019-01-16 NOTE — Therapy (Signed)
[place], [name], [address] Phone: [phone]   Fax:  [phone]  Wound Care Therapy  Patient Details  Name: Michaela Simpson MRN: [id number] Date of Birth: Nov 15, 1931 Referring Provider (PT): Dr. Allyn Kenner   Encounter Date: 01/16/2019  PT End of Session - 01/16/19 1546    Visit Number  63   progress note completed visit #47   Number of Visits  73    Date for PT Re-Evaluation  02/21/19    Authorization Type  Medicare Part A & B    Authorization Time Period  08/03/18- 09/02/18; 09/04/2018 - 10/02/2018; 09/25/18-10/16/18; 10/16/18-10/30/18; 10/30/18-11/27/18; 11/24/2018-12/22/2018; NEW: 12/26/18-01/09/19; 01/10/19-02/21/19    Authorization - Visit Number  3    Authorization - Number of Visits  10    PT Start Time  1660    PT Stop Time  1530    PT Time Calculation (min)  15 min    Activity Tolerance  Patient tolerated treatment well    Behavior During Therapy  [facility] for tasks assessed/performed       Past Medical History:  Diagnosis Date  . Osteoporosis   . Rheumatoid arthritis [facility])     Past Surgical History:  Procedure Laterality Date  . INTRAMEDULLARY (IM) NAIL INTERTROCHANTERIC Right 03/14/2015   Procedure: RIGHT INTERTROCHANTRIC INTRAMEDULLARY (IM) NAIL ;  Surgeon: Mcarthur Rossetti, MD;  Location: [place];  Service: Orthopedics;  Laterality: Right;    There were no vitals filed for this visit.              Wound Therapy - 01/16/19 1542    Subjective  Patient arrives for extra treatment this week as she had silver nitrate treatment performed to wound yesterday to re-open margins. She was presrcibed another round of antibiotics and is going to pick them up today.     Patient and Family Stated Goals  wound to heal     Date of Onset  05/05/18    Prior Treatments  self care, antibiotics     Pain Scale  0-10    Pain Score  0-No pain    Evaluation and Treatment Procedures Explained to Patient/Family  Yes     Evaluation and Treatment Procedures  agreed to    Wound Properties Date First Assessed: 08/03/18 Time First Assessed: 0949 Wound Type: Puncture Location: Leg Location Orientation: Right Present on Admission: Yes   Dressing Type  Gauze (Comment);Impregnated gauze (bismuth);Alginate    Dressing Changed  Changed    Dressing Status  Old drainage    Dressing Change Frequency  PRN    Site / Wound Assessment  Granulation tissue    % Wound base Red or Granulating  90%    % Wound base Yellow/Fibrinous Exudate  10%    % Wound base Black/Eschar  0%    Peri-wound Assessment  Intact;Edema    Wound Length (cm)  1.3 cm    Wound Width (cm)  0.4 cm    Wound Depth (cm)  0.1 cm    Wound Volume (cm^3)  0.05 cm^3    Wound Surface Area (cm^2)  0.52 cm^2    Margins  Attached edges (approximated)    Drainage Amount  Minimal    Drainage Description  Sanguineous    Treatment  Cleansed;Debridement (Selective)    Wound Properties Date First Assessed: 01/02/19 Time First Assessed: 1109 Wound Type: Other (Comment) Location: Leg Location Orientation: Left Wound Description (Comments): superior to initial wound Present on Admission: No  Dressing Type  Alginate    Dressing Changed  Changed    Dressing Status  Clean;Dry;Intact    % Wound base Red or Granulating  100%    % Wound base Yellow/Fibrinous Exudate  0%    % Wound base Black/Eschar  0%    Peri-wound Assessment  Erythema (blanchable);Maceration    Wound Length (cm)  0.4 cm    Wound Width (cm)  0.2 cm    Wound Depth (cm)  0.1 cm    Wound Volume (cm^3)  0.01 cm^3    Wound Surface Area (cm^2)  0.08 cm^2    Drainage Amount  Minimal    Drainage Description  Serosanguineous    Treatment  Cleansed;Debridement (Selective)    Selective Debridement - Location  wound bed, perimeter    Selective Debridement - Tools Used  Forceps    Selective Debridement - Tissue Removed  slough and devitalized tissues     Wound Therapy - Clinical Statement  Overall appearance  improved this session.  superior wound completely scabbed over but when removed reveals continued opening but with increased approximation and granluation.  Both wounds appear to be healing well.  Debrided edges to promote further approximation.  pt tolerated well without any complaint of pain.  Vaseline applied to edges and continued with xerform and CaAG.  Laser treatment not completed this session.    Hydrotherapy Plan  Debridement;Dressing change;Patient/family education;Pulsatile lavage with suction    Wound Therapy - Frequency  2X / week    Wound Therapy - Current Recommendations  PT    Wound Plan  Cleanse and debride as needed with appropriate dressings.  Resume laser if deemed beneficial.     Dressing   Inferior: xeroform, alginate, 2x2, medipore tape    Dressing  Superior: xeroform, alginate, 2x2, medipore tape                PT Short Term Goals - 01/10/19 1547      PT SHORT TERM GOAL #1   Title  PT wound to be 100% granulated to decrease cellulitis    Time  2    Period  Weeks    Status  On-going    Target Date  08/17/18      PT SHORT TERM GOAL #2   Title  Pt pain to be no greater than a 4/10 to allow pt to be able to tolerate standing/walking for up  to 30 minutes without increased pain for improved functional ability.     Time  2    Period  Weeks    Status  Achieved      PT SHORT TERM GOAL #3   Title  Patient will recieve silver nitrate to treat inferior wound margins to improve wound approximation    Time  2    Period  Days    Status  New    Target Date  01/12/19        PT Long Term Goals - 01/10/19 1546      PT LONG TERM GOAL #1   Title  Wound size to be decreased to no greater than 1.5x1.5 cm to allow pt to feel comfortable to complete self care for wound.     Time  4    Period  Weeks    Status  Achieved      PT LONG TERM GOAL #2   Title  Pt to be able to vocalize the benefits of wearing compression garments for varicosities.     Time  4    Period   Weeks    Status  On-going      PT LONG TERM GOAL #3   Title  Patient's wounds to fully approximate with no signs of infection or decreased skin integrity.    Time  6    Period  Weeks    Target Date  02/21/19              Patient will benefit from skilled therapeutic intervention in order to improve the following deficits and impairments:     Visit Diagnosis: 1. Pain in right lower leg   2. Open leg wound, right, sequela        Problem List Patient Active Problem List   Diagnosis Date Noted  . Rash and nonspecific skin eruption 04/06/2015  . Acute blood loss anemia 03/22/2015  . Edema 03/21/2015  . Acute renal failure syndrome ([place])   . Closed right hip fracture ([place]) 03/14/2015  . Rheumatoid arthritis ([place]) 03/14/2015  . Fall 03/14/2015   Teena Irani, PTA/CLT [phone]  Teena Irani 01/16/2019, 3:47 PM  [place] [address] [place], [name], [address] Phone: [phone]   Fax:  [phone]  Name: SHALITA NOTTE MRN: [id number] Date of Birth: 26-Oct-1931

## 2019-01-18 ENCOUNTER — Ambulatory Visit (HOSPITAL_COMMUNITY): Payer: Medicare Other

## 2019-01-18 ENCOUNTER — Encounter (HOSPITAL_COMMUNITY): Payer: Self-pay

## 2019-01-18 ENCOUNTER — Other Ambulatory Visit: Payer: Self-pay

## 2019-01-18 DIAGNOSIS — S81801S Unspecified open wound, right lower leg, sequela: Secondary | ICD-10-CM

## 2019-01-18 DIAGNOSIS — M79661 Pain in right lower leg: Secondary | ICD-10-CM

## 2019-01-18 NOTE — Therapy (Signed)
[place], [name], [address] Phone: [phone]   Fax:  [phone]  Wound Care Therapy  Patient Details  Name: Michaela Simpson MRN: [id number] Date of Birth: 07/02/1931 Referring Provider (PT): Dr. Allyn Kenner   Encounter Date: 01/18/2019  PT End of Session - 01/18/19 1344    Visit Number  80   Progress note complete visit #47   Number of Visits  73    Date for PT Re-Evaluation  02/21/19    Authorization Type  Medicare Part A & B    Authorization Time Period  08/03/18- 09/02/18; 09/04/2018 - 10/02/2018; 09/25/18-10/16/18; 10/16/18-10/30/18; 10/30/18-11/27/18; 11/24/2018-12/22/2018; NEW: 12/26/18-01/09/19; 01/10/19-02/21/19    Authorization - Visit Number  4    Authorization - Number of Visits  10    PT Start Time  1507    PT Stop Time  1523    PT Time Calculation (min)  16 min    Activity Tolerance  Patient tolerated treatment well    Behavior During Therapy  [facility] for tasks assessed/performed       Past Medical History:  Diagnosis Date  . Osteoporosis   . Rheumatoid arthritis [facility])     Past Surgical History:  Procedure Laterality Date  . INTRAMEDULLARY (IM) NAIL INTERTROCHANTERIC Right 03/14/2015   Procedure: RIGHT INTERTROCHANTRIC INTRAMEDULLARY (IM) NAIL ;  Surgeon: Mcarthur Rossetti, MD;  Location: [place];  Service: Orthopedics;  Laterality: Right;    There were no vitals filed for this visit.   Subjective Assessment - 01/18/19 1336    Subjective  Pt stated she is feeling good today, dressings intact.  No reports of pain today                Wound Therapy - 01/18/19 1338    Subjective  Pt stated she is feeling good today, dressings intact.  No reports of pain today    Patient and Family Stated Goals  wound to heal     Date of Onset  05/05/18    Prior Treatments  self care, antibiotics     Pain Scale  0-10    Pain Score  0-No pain    Evaluation and Treatment Procedures Explained to Patient/Family   Yes    Evaluation and Treatment Procedures  agreed to    Wound Properties Date First Assessed: 01/02/19 Time First Assessed: 1109 Wound Type: Other (Comment) Location: Leg Location Orientation: Left Wound Description (Comments): superior to initial wound Present on Admission: No   Dressing Type  Impregnated gauze (bismuth);Alginate;Tape dressing   xeroform, alginate and medipore tape   Dressing Changed  Changed    Dressing Status  Clean;Dry;Intact    % Wound base Red or Granulating  100%    % Wound base Yellow/Fibrinous Exudate  0%    % Wound base Black/Eschar  0%    Peri-wound Assessment  Erythema (blanchable);Maceration    Wound Length (cm)  0.4 cm    Wound Width (cm)  0.2 cm    Wound Depth (cm)  0 cm    Wound Volume (cm^3)  0 cm^3    Wound Surface Area (cm^2)  0.08 cm^2    Drainage Amount  Scant    Drainage Description  Serosanguineous    Treatment  Cleansed;Debridement (Selective)    Wound Properties Date First Assessed: 08/03/18 Time First Assessed: 3545 Wound Type: Puncture Location: Leg Location Orientation: Right Present on Admission: Yes   Dressing Type  Gauze (Comment);Impregnated gauze (bismuth);Alginate   xeroform,  alginate and medipore tape   Dressing Changed  Changed    Dressing Status  Old drainage    Dressing Change Frequency  PRN    Site / Wound Assessment  Granulation tissue    % Wound base Red or Granulating  95%    % Wound base Yellow/Fibrinous Exudate  5%    Peri-wound Assessment  Intact;Edema    Wound Length (cm)  1.2 cm    Wound Width (cm)  0.4 cm    Wound Depth (cm)  0.1 cm    Wound Volume (cm^3)  0.05 cm^3    Wound Surface Area (cm^2)  0.48 cm^2    Margins  Attached edges (approximated)    Drainage Amount  Scant    Drainage Description  Sanguineous    Treatment  Cleansed;Debridement (Selective)    Selective Debridement - Location  wound bed, perimeter    Selective Debridement - Tools Used  Forceps    Selective Debridement - Tissue Removed  slough and  devitalized tissues     Wound Therapy - Clinical Statement  Wounds progressing well with scant slough and improved granulation tissue for superior and inferior wounds.  Selective debridement around all edges to promote further approximation.  No reports of pain through session.  No depth noted on superior wound today.    Hydrotherapy Plan  Debridement;Dressing change;Patient/family education;Pulsatile lavage with suction    Wound Therapy - Frequency  2X / week    Wound Therapy - Current Recommendations  PT    Wound Plan  Cleanse and debride as needed with appropriate dressings.  Resume laser if deemed beneficial.     Dressing   Inferior: xeroform, alginate, 2x2, medipore tape    Dressing  Superior: xeroform, alginate, 2x2, medipore tape                PT Short Term Goals - 01/10/19 1547      PT SHORT TERM GOAL #1   Title  PT wound to be 100% granulated to decrease cellulitis    Time  2    Period  Weeks    Status  On-going    Target Date  08/17/18      PT SHORT TERM GOAL #2   Title  Pt pain to be no greater than a 4/10 to allow pt to be able to tolerate standing/walking for up  to 30 minutes without increased pain for improved functional ability.     Time  2    Period  Weeks    Status  Achieved      PT SHORT TERM GOAL #3   Title  Patient will recieve silver nitrate to treat inferior wound margins to improve wound approximation    Time  2    Period  Days    Status  New    Target Date  01/12/19        PT Long Term Goals - 01/10/19 1546      PT LONG TERM GOAL #1   Title  Wound size to be decreased to no greater than 1.5x1.5 cm to allow pt to feel comfortable to complete self care for wound.     Time  4    Period  Weeks    Status  Achieved      PT LONG TERM GOAL #2   Title  Pt to be able to vocalize the benefits of wearing compression garments for varicosities.     Time  4    Period  Weeks  Status  On-going      PT LONG TERM GOAL #3   Title  Patient's wounds  to fully approximate with no signs of infection or decreased skin integrity.    Time  6    Period  Weeks    Target Date  02/21/19              Patient will benefit from skilled therapeutic intervention in order to improve the following deficits and impairments:     Visit Diagnosis: 1. Pain in right lower leg   2. Open leg wound, right, sequela        Problem List Patient Active Problem List   Diagnosis Date Noted  . Rash and nonspecific skin eruption 04/06/2015  . Acute blood loss anemia 03/22/2015  . Edema 03/21/2015  . Acute renal failure syndrome ([place])   . Closed right hip fracture ([place]) 03/14/2015  . Rheumatoid arthritis ([place]) 03/14/2015  . Fall 03/14/2015   Ihor Austin, [place]; [place]  Aldona Lento 01/18/2019, 1:45 PM  [place] [address] [place], [name], [address] Phone: [phone]   Fax:  [phone]  Name: Michaela Simpson MRN: [id number] Date of Birth: Apr 11, 1932

## 2019-01-23 ENCOUNTER — Other Ambulatory Visit: Payer: Self-pay

## 2019-01-23 ENCOUNTER — Ambulatory Visit (HOSPITAL_COMMUNITY): Payer: Medicare Other | Attending: Internal Medicine | Admitting: Physical Therapy

## 2019-01-23 DIAGNOSIS — M79661 Pain in right lower leg: Secondary | ICD-10-CM | POA: Diagnosis not present

## 2019-01-23 DIAGNOSIS — S81801S Unspecified open wound, right lower leg, sequela: Secondary | ICD-10-CM | POA: Diagnosis not present

## 2019-01-23 NOTE — Therapy (Signed)
[place], [name], [address] Phone: [phone]   Fax:  [phone]  Wound Care Therapy  Patient Details  Name: Michaela Simpson MRN: [id number] Date of Birth: April 06, 1932 Referring Provider (PT): Dr. Allyn Kenner   Encounter Date: 01/23/2019  PT End of Session - 01/23/19 1237    Visit Number  63   Progress note complete visit #47   Number of Visits  73    Date for PT Re-Evaluation  02/21/19    Authorization Type  Medicare Part A & B    Authorization Time Period  08/03/18- 09/02/18; 09/04/2018 - 10/02/2018; 09/25/18-10/16/18; 10/16/18-10/30/18; 10/30/18-11/27/18; 11/24/2018-12/22/2018; NEW: 12/26/18-01/09/19; 01/10/19-02/21/19    Authorization - Visit Number  5    Authorization - Number of Visits  10    PT Start Time  1033    PT Stop Time  1100    PT Time Calculation (min)  27 min    Activity Tolerance  Patient tolerated treatment well    Behavior During Therapy  [facility] for tasks assessed/performed       Past Medical History:  Diagnosis Date  . Osteoporosis   . Rheumatoid arthritis [facility])     Past Surgical History:  Procedure Laterality Date  . INTRAMEDULLARY (IM) NAIL INTERTROCHANTERIC Right 03/14/2015   Procedure: RIGHT INTERTROCHANTRIC INTRAMEDULLARY (IM) NAIL ;  Surgeon: Mcarthur Rossetti, MD;  Location: [place];  Service: Orthopedics;  Laterality: Right;    There were no vitals filed for this visit.              Wound Therapy - 01/23/19 1233    Subjective  pt states she will be glad when it's healed    Patient and Family Stated Goals  wound to heal     Date of Onset  05/05/18    Prior Treatments  self care, antibiotics     Pain Scale  0-10    Pain Score  0-No pain    Evaluation and Treatment Procedures Explained to Patient/Family  Yes    Evaluation and Treatment Procedures  agreed to    Wound Properties Date First Assessed: 08/03/18 Time First Assessed: 0949 Wound Type: Puncture Location: Leg Location  Orientation: Right Present on Admission: Yes   Dressing Type  Gauze (Comment);Impregnated gauze (bismuth);Alginate   xeroform, alginate and medipore tape   Dressing Changed  Changed    Dressing Status  Old drainage    Dressing Change Frequency  PRN    Site / Wound Assessment  Granulation tissue    % Wound base Red or Granulating  95%    % Wound base Yellow/Fibrinous Exudate  5%    Peri-wound Assessment  Intact;Edema    Wound Length (cm)  1.8 cm    Wound Width (cm)  0.6 cm    Wound Depth (cm)  0.1 cm    Wound Volume (cm^3)  0.11 cm^3    Wound Surface Area (cm^2)  1.08 cm^2    Margins  Attached edges (approximated)    Drainage Amount  Scant    Drainage Description  Sanguineous    Treatment  Cleansed;Debridement (Selective)    Wound Properties Date First Assessed: 01/02/19 Time First Assessed: 1109 Wound Type: Other (Comment) Location: Leg Location Orientation: Left Wound Description (Comments): superior to initial wound Present on Admission: No   Dressing Type  Impregnated gauze (bismuth);Alginate;Tape dressing   xeroform, alginate and medipore tape   Dressing Status  Clean;Dry;Intact    % Wound base Red or  Granulating  100%    % Wound base Yellow/Fibrinous Exudate  0%    % Wound base Black/Eschar  0%    Peri-wound Assessment  Erythema (blanchable);Maceration    Wound Length (cm)  0.4 cm    Wound Width (cm)  0.2 cm    Wound Depth (cm)  0 cm    Wound Volume (cm^3)  0 cm^3    Wound Surface Area (cm^2)  0.08 cm^2    Drainage Amount  Scant    Drainage Description  Serosanguineous    Treatment  Cleansed;Debridement (Selective)    Selective Debridement - Location  wound bed, perimeter    Selective Debridement - Tools Used  Forceps    Selective Debridement - Tissue Removed  slough and devitalized tissues     Wound Therapy - Clinical Statement  most distal wound with larger margins this session but appears to be healing in and approximating.   Devitalized tissue removed from perimeter and  cleansed well.  Used very littel xeroform this session and guaze; no CaAg used as drainage has reduced.      Hydrotherapy Plan  Debridement;Dressing change;Patient/family education;Pulsatile lavage with suction    Wound Therapy - Frequency  2X / week    Wound Therapy - Current Recommendations  PT    Wound Plan  Cleanse and debride as needed with appropriate dressings.  Resume laser if deemed beneficial.     Dressing   Inferior: xeroform,2x2, medipore tape    Dressing  Superior: xeroform,  2x2, medipore tape                PT Short Term Goals - 01/10/19 1547      PT SHORT TERM GOAL #1   Title  PT wound to be 100% granulated to decrease cellulitis    Time  2    Period  Weeks    Status  On-going    Target Date  08/17/18      PT SHORT TERM GOAL #2   Title  Pt pain to be no greater than a 4/10 to allow pt to be able to tolerate standing/walking for up  to 30 minutes without increased pain for improved functional ability.     Time  2    Period  Weeks    Status  Achieved      PT SHORT TERM GOAL #3   Title  Patient will recieve silver nitrate to treat inferior wound margins to improve wound approximation    Time  2    Period  Days    Status  New    Target Date  01/12/19        PT Long Term Goals - 01/10/19 1546      PT LONG TERM GOAL #1   Title  Wound size to be decreased to no greater than 1.5x1.5 cm to allow pt to feel comfortable to complete self care for wound.     Time  4    Period  Weeks    Status  Achieved      PT LONG TERM GOAL #2   Title  Pt to be able to vocalize the benefits of wearing compression garments for varicosities.     Time  4    Period  Weeks    Status  On-going      PT LONG TERM GOAL #3   Title  Patient's wounds to fully approximate with no signs of infection or decreased skin integrity.    Time  6  Period  Weeks    Target Date  02/21/19              Patient will benefit from skilled therapeutic intervention in order to improve  the following deficits and impairments:     Visit Diagnosis: 1. Pain in right lower leg   2. Open leg wound, right, sequela        Problem List Patient Active Problem List   Diagnosis Date Noted  . Rash and nonspecific skin eruption 04/06/2015  . Acute blood loss anemia 03/22/2015  . Edema 03/21/2015  . Acute renal failure syndrome ([place])   . Closed right hip fracture ([place]) 03/14/2015  . Rheumatoid arthritis ([place]) 03/14/2015  . Fall 03/14/2015   Teena Irani, PTA/CLT [phone]  Teena Irani 01/23/2019, 12:38 PM  [place] [address] [place], [name], [address] Phone: [phone]   Fax:  [phone]  Name: Michaela Simpson MRN: [id number] Date of Birth: 23-Feb-1932

## 2019-01-26 ENCOUNTER — Other Ambulatory Visit: Payer: Self-pay

## 2019-01-26 ENCOUNTER — Ambulatory Visit (HOSPITAL_COMMUNITY): Payer: Medicare Other

## 2019-01-26 ENCOUNTER — Encounter (HOSPITAL_COMMUNITY): Payer: Self-pay

## 2019-01-26 DIAGNOSIS — S81801S Unspecified open wound, right lower leg, sequela: Secondary | ICD-10-CM | POA: Diagnosis not present

## 2019-01-26 DIAGNOSIS — M79661 Pain in right lower leg: Secondary | ICD-10-CM

## 2019-01-26 NOTE — Therapy (Signed)
[place] [address] [address], [name], [address] Phone: [phone]   Fax:  [phone]  Wound Care Therapy  Patient Details  Name: Michaela Simpson MRN: [id number] Date of Birth: 10-Jan-1932 Referring Provider (PT): Dr. Allyn Kenner   Encounter Date: 01/26/2019  PT End of Session - 01/26/19 1447    Visit Number  11   Progress note complete visit #47   Number of Visits  73    Date for PT Re-Evaluation  02/21/19    Authorization Type  Medicare Part A & B    Authorization Time Period  08/03/18- 09/02/18; 09/04/2018 - 10/02/2018; 09/25/18-10/16/18; 10/16/18-10/30/18; 10/30/18-11/27/18; 11/24/2018-12/22/2018; NEW: 12/26/18-01/09/19; 01/10/19-02/21/19    Authorization - Visit Number  9    Authorization - Number of Visits  10    PT Start Time  0932    PT Stop Time  1335    PT Time Calculation (min)  18 min    Activity Tolerance  Patient tolerated treatment well    Behavior During Therapy  [facility] for tasks assessed/performed       Past Medical History:  Diagnosis Date  . Osteoporosis   . Rheumatoid arthritis [facility])     Past Surgical History:  Procedure Laterality Date  . INTRAMEDULLARY (IM) NAIL INTERTROCHANTERIC Right 03/14/2015   Procedure: RIGHT INTERTROCHANTRIC INTRAMEDULLARY (IM) NAIL ;  Surgeon: Mcarthur Rossetti, MD;  Location: [place];  Service: Orthopedics;  Laterality: Right;    There were no vitals filed for this visit.   Subjective Assessment - 01/26/19 1442    Subjective  Pt arrived wiht dressings intact, reports excited to see how wound looks today.  No reoprts of pain.                Wound Therapy - 01/26/19 1442    Subjective  Pt arrived wiht dressings intact, reports excited to see how wound looks today.  No reoprts of pain.    Patient and Family Stated Goals  wound to heal     Date of Onset  05/05/18    Prior Treatments  self care, antibiotics     Pain Scale  0-10    Pain Score  0-No pain    Evaluation and Treatment  Procedures Explained to Patient/Family  Yes    Evaluation and Treatment Procedures  agreed to    Wound Properties Date First Assessed: 01/02/19 Time First Assessed: 1109 Wound Type: Other (Comment) Location: Leg Location Orientation: Left Wound Description (Comments): superior to initial wound Present on Admission: No   Dressing Type  Impregnated gauze (bismuth);Tape dressing;Gauze (Comment)    Dressing Changed  Changed    Dressing Status  Clean;Dry;Intact    % Wound base Red or Granulating  100%    % Wound base Yellow/Fibrinous Exudate  0%    Peri-wound Assessment  Erythema (blanchable);Maceration    Wound Length (cm)  0.4 cm    Wound Width (cm)  0.2 cm    Wound Depth (cm)  0 cm    Wound Volume (cm^3)  0 cm^3    Wound Surface Area (cm^2)  0.08 cm^2    Drainage Amount  Scant    Drainage Description  Serosanguineous    Treatment  Cleansed;Debridement (Selective)    Wound Properties Date First Assessed: 08/03/18 Time First Assessed: 0949 Wound Type: Puncture Location: Leg Location Orientation: Right Present on Admission: Yes   Dressing Type  Impregnated gauze (bismuth);Gauze (Comment);Tape dressing    Dressing Changed  Changed  Dressing Status  Old drainage    Dressing Change Frequency  PRN    Site / Wound Assessment  Granulation tissue    % Wound base Red or Granulating  100%    % Wound base Yellow/Fibrinous Exudate  0%    Peri-wound Assessment  Intact;Edema    Wound Length (cm)  1.6 cm   was 1.8   Wound Width (cm)  0.5 cm    Wound Depth (cm)  0 cm    Wound Volume (cm^3)  0 cm^3    Wound Surface Area (cm^2)  0.8 cm^2    Margins  Attached edges (approximated)    Drainage Amount  Scant    Drainage Description  Sanguineous    Treatment  Cleansed;Debridement (Selective)    Selective Debridement - Location  wound bed, perimeter    Selective Debridement - Tools Used  Forceps    Selective Debridement - Tissue Removed  slough and devitalized tissues     Wound Therapy - Clinical  Statement  Continues to increased in approximation and no depth noted today.  Selective debridement for perimeter of wound to removal slough and dry skin perimeter to assist with approximation.  Continued wiht xeroform, 2x2 gauze and medipore tape.  No reports of pain through session.     Hydrotherapy Plan  Debridement;Dressing change;Patient/family education;Pulsatile lavage with suction    Wound Therapy - Frequency  2X / week    Wound Therapy - Current Recommendations  PT    Wound Plan  10th progress note due next session.  Cleanse and debride as needed with appropriate dressings.  Resume laser if deemed beneficial.     Dressing   Inferior: xeroform,2x2, medipore tape    Dressing  Superior: xeroform,  2x2, medipore tape                PT Short Term Goals - 01/10/19 1547      PT SHORT TERM GOAL #1   Title  PT wound to be 100% granulated to decrease cellulitis    Time  2    Period  Weeks    Status  On-going    Target Date  08/17/18      PT SHORT TERM GOAL #2   Title  Pt pain to be no greater than a 4/10 to allow pt to be able to tolerate standing/walking for up  to 30 minutes without increased pain for improved functional ability.     Time  2    Period  Weeks    Status  Achieved      PT SHORT TERM GOAL #3   Title  Patient will recieve silver nitrate to treat inferior wound margins to improve wound approximation    Time  2    Period  Days    Status  New    Target Date  01/12/19        PT Long Term Goals - 01/10/19 1546      PT LONG TERM GOAL #1   Title  Wound size to be decreased to no greater than 1.5x1.5 cm to allow pt to feel comfortable to complete self care for wound.     Time  4    Period  Weeks    Status  Achieved      PT LONG TERM GOAL #2   Title  Pt to be able to vocalize the benefits of wearing compression garments for varicosities.     Time  4    Period  Weeks  Status  On-going      PT LONG TERM GOAL #3   Title  Patient's wounds to fully  approximate with no signs of infection or decreased skin integrity.    Time  6    Period  Weeks    Target Date  02/21/19              Patient will benefit from skilled therapeutic intervention in order to improve the following deficits and impairments:     Visit Diagnosis: 1. Open leg wound, right, sequela   2. Pain in right lower leg        Problem List Patient Active Problem List   Diagnosis Date Noted  . Rash and nonspecific skin eruption 04/06/2015  . Acute blood loss anemia 03/22/2015  . Edema 03/21/2015  . Acute renal failure syndrome ([place])   . Closed right hip fracture ([place]) 03/14/2015  . Rheumatoid arthritis ([place]) 03/14/2015  . Fall 03/14/2015   Ihor Austin, [place]; [place]  Aldona Lento 01/26/2019, 2:53 PM  [place] [address] [place], [name], [address] Phone: [phone]   Fax:  [phone]  Name: LAKENZIE MCCLAFFERTY MRN: [id number] Date of Birth: 1932/04/09

## 2019-01-29 ENCOUNTER — Encounter (HOSPITAL_COMMUNITY): Payer: Self-pay | Admitting: Physical Therapy

## 2019-01-29 ENCOUNTER — Ambulatory Visit (HOSPITAL_COMMUNITY): Payer: Medicare Other | Admitting: Physical Therapy

## 2019-01-29 ENCOUNTER — Other Ambulatory Visit: Payer: Self-pay

## 2019-01-29 DIAGNOSIS — S81801S Unspecified open wound, right lower leg, sequela: Secondary | ICD-10-CM

## 2019-01-29 DIAGNOSIS — M79661 Pain in right lower leg: Secondary | ICD-10-CM

## 2019-01-29 NOTE — Therapy (Signed)
[place], [name], [address] Phone: [phone]   Fax:  [phone]  Wound Care Therapy  Patient Details  Name: Michaela Simpson MRN: [id number] Date of Birth: 03-17-1932 Referring Provider (PT): Dr. Allyn Kenner  Progress Note Reporting Period 12/26/18  to 01/29/19  See note below for Objective Data and Assessment of Progress/Goals.       Encounter Date: 01/29/2019  PT End of Session - 01/29/19 1153    Visit Number  44    Number of Visits  12    Date for PT Re-Evaluation  02/21/19    Authorization Type  Medicare Part A & B    Authorization Time Period  08/03/18- 09/02/18; 09/04/2018 - 10/02/2018; 09/25/18-10/16/18; 10/16/18-10/30/18; 10/30/18-11/27/18; 11/24/2018-12/22/2018; NEW: 12/26/18-01/09/19; 01/10/19-02/21/19    Authorization - Visit Number  10    Authorization - Number of Visits  10    PT Start Time  1115    PT Stop Time  1130    PT Time Calculation (min)  15 min    Activity Tolerance  Patient tolerated treatment well    Behavior During Therapy  [facility] for tasks assessed/performed       Past Medical History:  Diagnosis Date  . Osteoporosis   . Rheumatoid arthritis [facility])     Past Surgical History:  Procedure Laterality Date  . INTRAMEDULLARY (IM) NAIL INTERTROCHANTERIC Right 03/14/2015   Procedure: RIGHT INTERTROCHANTRIC INTRAMEDULLARY (IM) NAIL ;  Surgeon: Mcarthur Rossetti, MD;  Location: [facility];  Service: Orthopedics;  Laterality: Right;    There were no vitals filed for this visit.              Wound Therapy - 01/29/19 1143    Subjective  Excited to see how wound looks, hoping for it to heal     Patient and Family Stated Goals  wound to heal     Date of Onset  05/05/18    Prior Treatments  self care, antibiotics     Pain Scale  0-10    Pain Score  0-No pain    Evaluation and Treatment Procedures Explained to Patient/Family  Yes    Evaluation and Treatment Procedures  agreed to    Wound  Properties Date First Assessed: 01/02/19 Time First Assessed: 1109 Wound Type: Other (Comment) Location: Leg Location Orientation: Left Wound Description (Comments): superior to initial wound Present on Admission: No   Dressing Type  Impregnated gauze (bismuth)    Dressing Changed  Changed    Dressing Status  Clean;Dry;Intact    % Wound base Red or Granulating  100%    % Wound base Yellow/Fibrinous Exudate  0%    % Wound base Black/Eschar  0%    Wound Length (cm)  0.5 cm    Wound Width (cm)  0.3 cm    Wound Depth (cm)  0 cm    Wound Volume (cm^3)  0 cm^3    Wound Surface Area (cm^2)  0.15 cm^2    Drainage Amount  Scant    Drainage Description  Serosanguineous    Treatment  Cleansed;Packing (Impregnated strip)    Wound Properties Date First Assessed: 08/03/18 Time First Assessed: 0949 Wound Type: Puncture Location: Leg Location Orientation: Right Present on Admission: Yes   Dressing Type  Gauze (Comment);Impregnated gauze (bismuth);Tape dressing    Dressing Changed  Changed    Dressing Status  Old drainage    Dressing Change Frequency  PRN    Site / Wound Assessment  Granulation tissue    % Wound base Red or Granulating  100%    % Wound base Yellow/Fibrinous Exudate  0%    Peri-wound Assessment  Intact;Edema    Wound Length (cm)  1.6 cm    Wound Width (cm)  0.6 cm    Wound Depth (cm)  0 cm    Wound Volume (cm^3)  0 cm^3    Wound Surface Area (cm^2)  0.96 cm^2    Margins  Unattached edges (unapproximated)    Drainage Amount  Scant    Drainage Description  Sanguineous    Treatment  Cleansed;Debridement (Selective);Packing (Impregnated strip)    Wound Therapy - Clinical Statement  Ms. Hayashi continues to demonstrate ongoing non-healing wounds, which continue to heal at a very slow rate. Removed dressings and washed wound area and surrounding intact skin with soap and water, then continued to dress with xeroform. Amount of dry skin around wound bed appears to be improving. Discussion  and education on dietary and lifestyle factors that prevent quality wound healing was performed during dressing change. She will continue to benefit from skilled PT services moving forward to promote wound healing.    Hydrotherapy Plan  Debridement;Dressing change;Patient/family education;Pulsatile lavage with suction    Wound Therapy - Frequency  2X / week    Wound Therapy - Current Recommendations  PT    Wound Plan  cleanse and debride, return to laser use if necessary     Dressing   Inferior: xeroform, gauze, medipore     Dressing  Superior: xeroform, gauze, medipore               PT Education - 01/29/19 1152    Education Details  dietary and lifestyle factors that influence wound healing, wound status, continue POC    Person(s) Educated  Patient    Methods  Explanation    Comprehension  Verbalized understanding       PT Short Term Goals - 01/29/19 1157      PT SHORT TERM GOAL #1   Title  PT wound to be 100% granulated to decrease cellulitis    Time  2    Period  Weeks    Status  Achieved      PT SHORT TERM GOAL #2   Title  Pt pain to be no greater than a 4/10 to allow pt to be able to tolerate standing/walking for up  to 30 minutes without increased pain for improved functional ability.     Time  2    Period  Weeks    Status  Achieved      PT SHORT TERM GOAL #3   Title  Patient will recieve silver nitrate to treat inferior wound margins to improve wound approximation    Time  2    Period  Days    Status  Deferred    Target Date  01/12/19        PT Long Term Goals - 01/29/19 1158      PT LONG TERM GOAL #1   Title  Wound size to be decreased to no greater than 1.5x1.5 cm to allow pt to feel comfortable to complete self care for wound.     Time  4    Period  Weeks    Status  Achieved      PT LONG TERM GOAL #2   Title  Pt to be able to vocalize the benefits of wearing compression garments for varicosities.     Time  4  Period  Weeks    Status  On-going       PT LONG TERM GOAL #3   Title  Patient's wounds to fully approximate with no signs of infection or decreased skin integrity.    Time  6    Period  Weeks    Status  On-going              Patient will benefit from skilled therapeutic intervention in order to improve the following deficits and impairments:     Visit Diagnosis: 1. Open leg wound, right, sequela   2. Pain in right lower leg        Problem List Patient Active Problem List   Diagnosis Date Noted  . Rash and nonspecific skin eruption 04/06/2015  . Acute blood loss anemia 03/22/2015  . Edema 03/21/2015  . Acute renal failure syndrome ([place])   . Closed right hip fracture ([place]) 03/14/2015  . Rheumatoid arthritis ([place]) 03/14/2015  . Fall 03/14/2015    Deniece Ree PT, DPT, [place]  Supplemental Physical Therapist [facility]    Pager [phone] Acute Rehab Office [facility] [address] [place], [name], [address] Phone: [phone]   Fax:  [phone]  Name: BRENLYNN FAKE MRN: [id number] Date of Birth: 1931/12/19

## 2019-02-01 ENCOUNTER — Other Ambulatory Visit: Payer: Self-pay

## 2019-02-01 ENCOUNTER — Encounter (HOSPITAL_COMMUNITY): Payer: Self-pay

## 2019-02-01 ENCOUNTER — Ambulatory Visit (HOSPITAL_COMMUNITY): Payer: Medicare Other

## 2019-02-01 DIAGNOSIS — M79661 Pain in right lower leg: Secondary | ICD-10-CM

## 2019-02-01 DIAGNOSIS — S81801S Unspecified open wound, right lower leg, sequela: Secondary | ICD-10-CM

## 2019-02-01 NOTE — Therapy (Signed)
[place], [name], [address] Phone: [phone]   Fax:  [phone]  Wound Care Therapy  Patient Details  Name: Michaela Simpson MRN: [id number] Date of Birth: 01-Aug-1931 Referring Provider (PT): Dr. Allyn Kenner   Encounter Date: 02/01/2019  PT End of Session - 02/01/19 1157    Visit Number  68    Number of Visits  24    Date for PT Re-Evaluation  02/21/19    Authorization Type  Medicare Part A & B    Authorization Time Period  08/03/18- 09/02/18; 09/04/2018 - 10/02/2018; 09/25/18-10/16/18; 10/16/18-10/30/18; 10/30/18-11/27/18; 11/24/2018-12/22/2018; NEW: 12/26/18-01/09/19; 01/10/19-02/21/19    Authorization - Visit Number  1    Authorization - Number of Visits  10    PT Start Time  1118    PT Stop Time  1142    PT Time Calculation (min)  24 min    Activity Tolerance  Patient tolerated treatment well    Behavior During Therapy  [facility] for tasks assessed/performed       Past Medical History:  Diagnosis Date  . Osteoporosis   . Rheumatoid arthritis [facility])     Past Surgical History:  Procedure Laterality Date  . INTRAMEDULLARY (IM) NAIL INTERTROCHANTERIC Right 03/14/2015   Procedure: RIGHT INTERTROCHANTRIC INTRAMEDULLARY (IM) NAIL ;  Surgeon: Mcarthur Rossetti, MD;  Location: [facility];  Service: Orthopedics;  Laterality: Right;    There were no vitals filed for this visit.   Subjective Assessment - 02/01/19 1150    Subjective  Pt arrived in good spirit, excited to see how wound looks today.  No pain                Wound Therapy - 02/01/19 1151    Subjective  Pt arrived in good spirit, excited to see how wound looks today.  No pain    Patient and Family Stated Goals  wound to heal     Date of Onset  05/05/18    Prior Treatments  self care, antibiotics     Pain Scale  0-10    Pain Score  0-No pain    Evaluation and Treatment Procedures Explained to Patient/Family  Yes    Evaluation and Treatment Procedures   agreed to    Wound Properties Date First Assessed: 01/02/19 Time First Assessed: 1109 Wound Type: Other (Comment) Location: Leg Location Orientation: Left Wound Description (Comments): superior to initial wound Present on Admission: No   Dressing Type  Impregnated gauze (bismuth)    Dressing Changed  Changed    Dressing Status  Clean;Dry;Intact    % Wound base Red or Granulating  100%    % Wound base Yellow/Fibrinous Exudate  0%    Wound Length (cm)  0.5 cm    Wound Width (cm)  0.3 cm    Wound Depth (cm)  0 cm    Wound Volume (cm^3)  0 cm^3    Wound Surface Area (cm^2)  0.15 cm^2    Drainage Amount  Scant    Drainage Description  Serosanguineous    Treatment  Cleansed;Debridement (Selective)    Wound Properties Date First Assessed: 08/03/18 Time First Assessed: 0949 Wound Type: Puncture Location: Leg Location Orientation: Right Present on Admission: Yes   Dressing Type  Impregnated gauze (bismuth);Gauze (Comment)    Dressing Changed  Changed    Dressing Status  Old drainage    Dressing Change Frequency  PRN    Site / Wound Assessment  Granulation tissue    %  Wound base Red or Granulating  100%    % Wound base Yellow/Fibrinous Exudate  0%    Peri-wound Assessment  Intact;Edema    Wound Length (cm)  1.6 cm    Wound Width (cm)  0.6 cm    Wound Depth (cm)  0 cm   hypergranulation in wound bed    Wound Volume (cm^3)  0 cm^3    Wound Surface Area (cm^2)  0.96 cm^2    Margins  Unattached edges (unapproximated)    Drainage Amount  Scant    Drainage Description  Sanguineous    Treatment  Cleansed;Debridement (Selective)    Wound Properties Date First Assessed: 02/01/19 Time First Assessed: 1120 Wound Type: Other (Comment) Location: Leg Location Orientation: Right Wound Description (Comments): lateral aspect of superior wound Present on Admission: No   Dressing Type  Gauze (Comment);Impregnated gauze (bismuth)   xeroform, 2x2 gause, medipore tape   Dressing Changed  Changed    Dressing  Status  Clean;Dry;Intact    Site / Wound Assessment  Pale;Red    % Wound base Red or Granulating  100%    % Wound base Yellow/Fibrinous Exudate  0%    % Wound base Black/Eschar  0%    % Wound base Other/Granulation Tissue (Comment)  0%    Peri-wound Assessment  Intact;Edema    Wound Length (cm)  0.3 cm    Wound Width (cm)  0.3 cm    Wound Depth (cm)  0 cm    Wound Volume (cm^3)  0 cm^3    Wound Surface Area (cm^2)  0.09 cm^2    Drainage Amount  Scant    Drainage Description  Sanguineous    Selective Debridement - Location  wound bed, perimeter    Selective Debridement - Tools Used  Forceps    Selective Debridement - Tissue Removed  slough and devitalized tissues     Wound Therapy - Clinical Statement  Pt arrived with noted maceration on skin between superior and inferior wound.  New wound lateral superior aspect of wounds.  Noted purple surrounding wounds as well.  Evaluation therapist aware of new wound with measurements/photo taken.  Continued selective debriement for removal of slough and dryskin perimeter.  Pt may benefit from returned laser therapy next session along with wound care.  PT plans to contant MD concerning new wound.     Hydrotherapy Plan  Debridement;Dressing change;Patient/family education;Pulsatile lavage with suction    Wound Therapy - Frequency  2X / week    Wound Therapy - Current Recommendations  PT    Wound Plan  cleanse and debride, return to laser use if necessary     Dressing   Inferior: xeroform, gauze, medipore     Dressing  Superior: xeroform, gauze, medipore     Dressing  lateral superior: xeroform, 2x2 and medipore tape                PT Short Term Goals - 01/29/19 1157      PT SHORT TERM GOAL #1   Title  PT wound to be 100% granulated to decrease cellulitis    Time  2    Period  Weeks    Status  Achieved      PT SHORT TERM GOAL #2   Title  Pt pain to be no greater than a 4/10 to allow pt to be able to tolerate standing/walking for up  to  30 minutes without increased pain for improved functional ability.     Time  2  Period  Weeks    Status  Achieved      PT SHORT TERM GOAL #3   Title  Patient will recieve silver nitrate to treat inferior wound margins to improve wound approximation    Time  2    Period  Days    Status  Deferred    Target Date  01/12/19        PT Long Term Goals - 01/29/19 1158      PT LONG TERM GOAL #1   Title  Wound size to be decreased to no greater than 1.5x1.5 cm to allow pt to feel comfortable to complete self care for wound.     Time  4    Period  Weeks    Status  Achieved      PT LONG TERM GOAL #2   Title  Pt to be able to vocalize the benefits of wearing compression garments for varicosities.     Time  4    Period  Weeks    Status  On-going      PT LONG TERM GOAL #3   Title  Patient's wounds to fully approximate with no signs of infection or decreased skin integrity.    Time  6    Period  Weeks    Status  On-going              Patient will benefit from skilled therapeutic intervention in order to improve the following deficits and impairments:     Visit Diagnosis: 1. Pain in right lower leg   2. Open leg wound, right, sequela        Problem List Patient Active Problem List   Diagnosis Date Noted  . Rash and nonspecific skin eruption 04/06/2015  . Acute blood loss anemia 03/22/2015  . Edema 03/21/2015  . Acute renal failure syndrome ([place])   . Closed right hip fracture ([employer]) 03/14/2015  . Rheumatoid arthritis ([place]) 03/14/2015  . Fall 03/14/2015   Ihor Austin, [place]; [place]  Aldona Lento 02/01/2019, 12:06 PM  [place] [address] [place], [name], [address] Phone: [phone]   Fax:  [phone]  Name: Michaela Simpson MRN: [id number] Date of Birth: 1932-05-30

## 2019-02-06 ENCOUNTER — Ambulatory Visit (HOSPITAL_COMMUNITY): Payer: Medicare Other | Admitting: Physical Therapy

## 2019-02-06 ENCOUNTER — Other Ambulatory Visit: Payer: Self-pay

## 2019-02-06 DIAGNOSIS — M79661 Pain in right lower leg: Secondary | ICD-10-CM

## 2019-02-06 DIAGNOSIS — S81801S Unspecified open wound, right lower leg, sequela: Secondary | ICD-10-CM | POA: Diagnosis not present

## 2019-02-06 NOTE — Therapy (Signed)
[place], [name], [address] Phone: [phone]   Fax:  [phone]  Wound Care Therapy  Patient Details  Name: Michaela Simpson MRN: [id number] Date of Birth: 09/19/1931 Referring Provider (PT): Dr. Allyn Kenner   Encounter Date: 02/06/2019  PT End of Session - 02/06/19 1650    Visit Number  65    Number of Visits  17    Date for PT Re-Evaluation  02/21/19    Authorization Type  Medicare Part A & B    Authorization Time Period  08/03/18- 09/02/18; 09/04/2018 - 10/02/2018; 09/25/18-10/16/18; 10/16/18-10/30/18; 10/30/18-11/27/18; 11/24/2018-12/22/2018; NEW: 12/26/18-01/09/19; 01/10/19-02/21/19    Authorization - Visit Number  2    Authorization - Number of Visits  10    PT Start Time  3500    PT Stop Time  1200    PT Time Calculation (min)  26 min    Activity Tolerance  Patient tolerated treatment well    Behavior During Therapy  [facility] for tasks assessed/performed       Past Medical History:  Diagnosis Date  . Osteoporosis   . Rheumatoid arthritis [facility])     Past Surgical History:  Procedure Laterality Date  . INTRAMEDULLARY (IM) NAIL INTERTROCHANTERIC Right 03/14/2015   Procedure: RIGHT INTERTROCHANTRIC INTRAMEDULLARY (IM) NAIL ;  Surgeon: Mcarthur Rossetti, MD;  Location: [facility];  Service: Orthopedics;  Laterality: Right;    There were no vitals filed for this visit.              Wound Therapy - 02/06/19 1644    Subjective  pt states she feels its finally getting better.    Patient and Family Stated Goals  wound to heal     Date of Onset  05/05/18    Prior Treatments  self care, antibiotics     Pain Scale  0-10    Pain Score  0-No pain    Evaluation and Treatment Procedures Explained to Patient/Family  Yes    Evaluation and Treatment Procedures  agreed to    Wound Properties Date First Assessed: 08/03/18 Time First Assessed: 0949 Wound Type: Puncture Location: Leg Location Orientation: Right Present on  Admission: Yes   Dressing Type  Impregnated gauze (bismuth);Gauze (Comment)    Dressing Changed  Changed    Dressing Status  Old drainage    Dressing Change Frequency  PRN    Site / Wound Assessment  Granulation tissue    % Wound base Red or Granulating  100%    % Wound base Yellow/Fibrinous Exudate  0%    Peri-wound Assessment  Intact;Edema    Wound Length (cm)  1.4 cm    Wound Width (cm)  0.4 cm    Wound Depth (cm)  0 cm    Wound Volume (cm^3)  0 cm^3    Wound Surface Area (cm^2)  0.56 cm^2    Margins  Unattached edges (unapproximated)    Drainage Amount  Scant    Drainage Description  Sanguineous    Treatment  Cleansed;Debridement (Selective)    Wound Properties Date First Assessed: 02/01/19 Time First Assessed: 1120 Wound Type: Other (Comment) Location: Leg Location Orientation: Right Wound Description (Comments): lateral aspect of superior wound Present on Admission: No   Dressing Type  --    Dressing Changed  --    Dressing Status  --    Site / Wound Assessment  --    % Wound base Red or Granulating  --    %  Wound base Yellow/Fibrinous Exudate  --    % Wound base Black/Eschar  --    % Wound base Other/Granulation Tissue (Comment)  --    Peri-wound Assessment  --    Wound Length (cm)  --    Wound Width (cm)  --    Wound Depth (cm)  --    Wound Volume (cm^3)  --    Wound Surface Area (cm^2)  --    Drainage Amount  --    Drainage Description  --    Treatment  --    Wound Properties Date First Assessed: 01/02/19 Time First Assessed: 1109 Wound Type: Other (Comment) Location: Leg Location Orientation: Left Wound Description (Comments): superior to initial wound Present on Admission: No   Dressing Type  Impregnated gauze (bismuth)    Dressing Changed  Changed    Dressing Status  Clean;Dry;Intact    % Wound base Red or Granulating  100%    % Wound base Yellow/Fibrinous Exudate  0%    Wound Length (cm)  0.4 cm    Wound Width (cm)  0.3 cm    Wound Depth (cm)  0 cm    Wound  Volume (cm^3)  0 cm^3    Wound Surface Area (cm^2)  0.12 cm^2    Drainage Amount  Scant    Drainage Description  Serosanguineous    Treatment  Cleansed;Debridement (Selective)    Selective Debridement - Location  wound bed, perimeter    Selective Debridement - Tools Used  Forceps    Selective Debridement - Tissue Removed  slough and devitalized tissues     Wound Therapy - Clinical Statement  Overall improvement in size noted today with measurement.  Most superior wound is no longer opened at this time.  Debrided perimeter of wounds removing dry skin and cleansed well.  Contineud with xeroform placed inside of wound with gauze over and medipore tape.  Some maceration noted but easily removed.  Also with a little erythema, however believe it was from tape irritation.  Pt would like to continue a couple more weeks before deciding on going to dermatologist as she feels it is finally healing.     Hydrotherapy Plan  Debridement;Dressing change;Patient/family education;Pulsatile lavage with suction    Wound Therapy - Frequency  2X / week    Wound Therapy - Current Recommendations  PT    Wound Plan  cleanse and debride, return to laser use if necessary     Dressing    xeroform, gauze, medipore     Dressing  --                PT Short Term Goals - 01/29/19 1157      PT SHORT TERM GOAL #1   Title  PT wound to be 100% granulated to decrease cellulitis    Time  2    Period  Weeks    Status  Achieved      PT SHORT TERM GOAL #2   Title  Pt pain to be no greater than a 4/10 to allow pt to be able to tolerate standing/walking for up  to 30 minutes without increased pain for improved functional ability.     Time  2    Period  Weeks    Status  Achieved      PT SHORT TERM GOAL #3   Title  Patient will recieve silver nitrate to treat inferior wound margins to improve wound approximation    Time  2  Period  Days    Status  Deferred    Target Date  01/12/19        PT Long Term Goals -  01/29/19 1158      PT LONG TERM GOAL #1   Title  Wound size to be decreased to no greater than 1.5x1.5 cm to allow pt to feel comfortable to complete self care for wound.     Time  4    Period  Weeks    Status  Achieved      PT LONG TERM GOAL #2   Title  Pt to be able to vocalize the benefits of wearing compression garments for varicosities.     Time  4    Period  Weeks    Status  On-going      PT LONG TERM GOAL #3   Title  Patient's wounds to fully approximate with no signs of infection or decreased skin integrity.    Time  6    Period  Weeks    Status  On-going              Patient will benefit from skilled therapeutic intervention in order to improve the following deficits and impairments:     Visit Diagnosis: 1. Open leg wound, right, sequela   2. Pain in right lower leg        Problem List Patient Active Problem List   Diagnosis Date Noted  . Rash and nonspecific skin eruption 04/06/2015  . Acute blood loss anemia 03/22/2015  . Edema 03/21/2015  . Acute renal failure syndrome ([place])   . Closed right hip fracture ([place]) 03/14/2015  . Rheumatoid arthritis ([place]) 03/14/2015  . Fall 03/14/2015   Teena Irani, PTA/CLT [phone]  Teena Irani 02/06/2019, 4:51 PM  [place] [address] [place], [name], [address] Phone: [phone]   Fax:  [phone]  Name: Michaela Simpson MRN: [id number] Date of Birth: 1932/04/03

## 2019-02-09 ENCOUNTER — Ambulatory Visit (HOSPITAL_COMMUNITY): Payer: Medicare Other

## 2019-02-09 ENCOUNTER — Other Ambulatory Visit: Payer: Self-pay

## 2019-02-09 ENCOUNTER — Encounter (HOSPITAL_COMMUNITY): Payer: Self-pay

## 2019-02-09 DIAGNOSIS — S81801S Unspecified open wound, right lower leg, sequela: Secondary | ICD-10-CM | POA: Diagnosis not present

## 2019-02-09 DIAGNOSIS — M79661 Pain in right lower leg: Secondary | ICD-10-CM | POA: Diagnosis not present

## 2019-02-09 NOTE — Therapy (Signed)
[place], [name], [address] Phone: [phone]   Fax:  [phone]  Wound Care Therapy  Patient Details  Name: Michaela Simpson MRN: [id number] Date of Birth: 03-Dec-1931 Referring Provider (PT): Dr. Allyn Kenner   Encounter Date: 02/09/2019  PT End of Session - 02/09/19 1206    Visit Number  60    Number of Visits  59    Date for PT Re-Evaluation  02/21/19    Authorization Type  Medicare Part A & B    Authorization Time Period  08/03/18- 09/02/18; 09/04/2018 - 10/02/2018; 09/25/18-10/16/18; 10/16/18-10/30/18; 10/30/18-11/27/18; 11/24/2018-12/22/2018; NEW: 12/26/18-01/09/19; 01/10/19-02/21/19    Authorization - Visit Number  3    Authorization - Number of Visits  10    PT Start Time  1113    PT Stop Time  1142    PT Time Calculation (min)  29 min    Activity Tolerance  Patient tolerated treatment well    Behavior During Therapy  [facility] for tasks assessed/performed       Past Medical History:  Diagnosis Date  . Osteoporosis   . Rheumatoid arthritis [facility])     Past Surgical History:  Procedure Laterality Date  . INTRAMEDULLARY (IM) NAIL INTERTROCHANTERIC Right 03/14/2015   Procedure: RIGHT INTERTROCHANTRIC INTRAMEDULLARY (IM) NAIL ;  Surgeon: Mcarthur Rossetti, MD;  Location: [place];  Service: Orthopedics;  Laterality: Right;    There were no vitals filed for this visit.    Wound Therapy - 02/09/19 1147    Subjective  Patient denies pain at start of session. Is still frustrated with her wound not healing.    Patient and Family Stated Goals  wound to heal     Date of Onset  05/05/18    Prior Treatments  self care, antibiotics     Pain Scale  0-10    Pain Score  0-No pain    Evaluation and Treatment Procedures Explained to Patient/Family  Yes    Evaluation and Treatment Procedures  agreed to    Wound Properties Date First Assessed: 08/03/18 Time First Assessed: 0949 Wound Type: Puncture Location: Leg Location Orientation:  Right Present on Admission: Yes   Dressing Type  Impregnated gauze (bismuth);Gauze (Comment)    Dressing Changed  Changed    Dressing Status  Old drainage    Dressing Change Frequency  PRN    Site / Wound Assessment  Granulation tissue    % Wound base Red or Granulating  100%    % Wound base Yellow/Fibrinous Exudate  0%    Peri-wound Assessment  Intact;Edema    Margins  Unattached edges (unapproximated)    Drainage Amount  Scant    Drainage Description  Sanguineous    Treatment  Cleansed;Debridement (Selective)    Wound Properties Date First Assessed: 01/02/19 Time First Assessed: 1109 Wound Type: Other (Comment) Location: Leg Location Orientation: Left Wound Description (Comments): superior to initial wound Present on Admission: No   Dressing Type  Impregnated gauze (bismuth)    Dressing Changed  Changed    Dressing Status  Clean;Dry;Intact    % Wound base Red or Granulating  100%    % Wound base Yellow/Fibrinous Exudate  0%    Drainage Amount  Scant    Drainage Description  Serosanguineous    Treatment  Cleansed;Debridement (Selective)    Selective Debridement - Location  wound bed, perimeter    Selective Debridement - Tools Used  Forceps    Selective Debridement - Tissue  Removed  slough and devitalized tissues     Wound Therapy - Clinical Statement  Patients wound continues with minimal change or improvements. Superior wound has thin fragile layer of skin overtop of wound bed and this was not removed or debrided today. Inferior/primary wound remains mostly unchanged, debridement performed to remove slough to wound bed. Small red spots surround the inferolateral boarder of the wound and have previously opened on several prior occasions. Applied xeroform to both superior and inferior wound this date. Patient also noted to have some erythema present at inferomedial boarder of wound. Uncertain if this is related to tape or an ongoing infection. Educated pt on best plan for care to refer her  to a wound care center with an MD in the office to provide additional treatments and interventions that we are not able to. Educated on importance of eating enough protein and encouraged to begin a breakfast supplement drink to ensure she is getting enough calories and nutrients for wound to heal. This therapist will call pt's referring MD and request a new referral be sent to the wound care clinic in [place]. We will continue to treat her until she begins care in that office.    Hydrotherapy Plan  Debridement;Dressing change;Patient/family education;Pulsatile lavage with suction    Wound Therapy - Frequency  2X / week    Wound Therapy - Current Recommendations  PT    Wound Plan  cleanse and debride, return to laser use if necessary     Dressing    xeroform, gauze, medipore     Dressing  Superior: xeroform, gauze, medipore          PT Short Term Goals - 01/29/19 1157      PT SHORT TERM GOAL #1   Title  PT wound to be 100% granulated to decrease cellulitis    Time  2    Period  Weeks    Status  Achieved      PT SHORT TERM GOAL #2   Title  Pt pain to be no greater than a 4/10 to allow pt to be able to tolerate standing/walking for up  to 30 minutes without increased pain for improved functional ability.     Time  2    Period  Weeks    Status  Achieved      PT SHORT TERM GOAL #3   Title  Patient will recieve silver nitrate to treat inferior wound margins to improve wound approximation    Time  2    Period  Days    Status  Deferred    Target Date  01/12/19        PT Long Term Goals - 01/29/19 1158      PT LONG TERM GOAL #1   Title  Wound size to be decreased to no greater than 1.5x1.5 cm to allow pt to feel comfortable to complete self care for wound.     Time  4    Period  Weeks    Status  Achieved      PT LONG TERM GOAL #2   Title  Pt to be able to vocalize the benefits of wearing compression garments for varicosities.     Time  4    Period  Weeks    Status  On-going       PT LONG TERM GOAL #3   Title  Patient's wounds to fully approximate with no signs of infection or decreased skin integrity.    Time  6  Period  Weeks    Status  On-going         Plan - 02/09/19 1207    Clinical Impression Statement  see above    Rehab Potential  Good    PT Frequency  2x / week    PT Duration  6 weeks   4 more   PT Treatment/Interventions  ADLs/Self Care Home Management;Patient/family education;Other (comment)   debridement and dressing change    PT Next Visit Plan  see above    Consulted and Agree with Plan of Care  Patient       Patient will benefit from skilled therapeutic intervention in order to improve the following deficits and impairments:  Decreased skin integrity, Pain  Visit Diagnosis: Open leg wound, right, sequela  Pain in right lower leg     Problem List Patient Active Problem List   Diagnosis Date Noted  . Rash and nonspecific skin eruption 04/06/2015  . Acute blood loss anemia 03/22/2015  . Edema 03/21/2015  . Acute renal failure syndrome ([place])   . Closed right hip fracture ([place]) 03/14/2015  . Rheumatoid arthritis ([place]) 03/14/2015  . Fall 03/14/2015    Kipp Brood, PT, DPT, [facility] Physical Therapist with [facility]  02/09/2019 12:07 PM    [place], [name], [address] Phone: [phone]   Fax:  [phone]  Name: JERUSALEN LAMASTUS MRN: [id number] Date of Birth: 03-26-1932

## 2019-02-12 ENCOUNTER — Ambulatory Visit (HOSPITAL_COMMUNITY): Payer: Medicare Other | Admitting: Physical Therapy

## 2019-02-12 ENCOUNTER — Other Ambulatory Visit: Payer: Self-pay

## 2019-02-12 DIAGNOSIS — M79661 Pain in right lower leg: Secondary | ICD-10-CM

## 2019-02-12 DIAGNOSIS — S81801S Unspecified open wound, right lower leg, sequela: Secondary | ICD-10-CM

## 2019-02-12 NOTE — Therapy (Signed)
[place], [name], [address] Phone: [phone]   Fax:  [phone]  Wound Care Therapy  Patient Details  Name: Michaela Simpson MRN: [id number] Date of Birth: 06-Feb-1932 Referring Provider (PT): Dr. Allyn Kenner   Encounter Date: 02/12/2019  PT End of Session - 02/12/19 1235    Visit Number  87    Number of Visits  75    Date for PT Re-Evaluation  02/21/19    Authorization Type  Medicare Part A & B    Authorization Time Period  08/03/18- 09/02/18; 09/04/2018 - 10/02/2018; 09/25/18-10/16/18; 10/16/18-10/30/18; 10/30/18-11/27/18; 11/24/2018-12/22/2018; NEW: 12/26/18-01/09/19; 01/10/19-02/21/19    Authorization - Visit Number  4    Authorization - Number of Visits  10    PT Start Time  1135    PT Stop Time  1200    PT Time Calculation (min)  25 min    Activity Tolerance  Patient tolerated treatment well    Behavior During Therapy  [facility] for tasks assessed/performed       Past Medical History:  Diagnosis Date  . Osteoporosis   . Rheumatoid arthritis [facility])     Past Surgical History:  Procedure Laterality Date  . INTRAMEDULLARY (IM) NAIL INTERTROCHANTERIC Right 03/14/2015   Procedure: RIGHT INTERTROCHANTRIC INTRAMEDULLARY (IM) NAIL ;  Surgeon: Mcarthur Rossetti, MD;  Location: [place];  Service: Orthopedics;  Laterality: Right;    There were no vitals filed for this visit.              Wound Therapy - 02/12/19 1231    Subjective  pt states it is not bothering her.    Patient and Family Stated Goals  wound to heal     Date of Onset  05/05/18    Prior Treatments  self care, antibiotics     Pain Scale  0-10    Pain Score  0-No pain    Evaluation and Treatment Procedures Explained to Patient/Family  Yes    Evaluation and Treatment Procedures  agreed to    Wound Properties Date First Assessed: 08/03/18 Time First Assessed: 0949 Wound Type: Puncture Location: Leg Location Orientation: Right Present on Admission: Yes    Dressing Type  Impregnated gauze (bismuth);Gauze (Comment)    Dressing Changed  Changed    Dressing Status  Old drainage    Dressing Change Frequency  PRN    Site / Wound Assessment  Granulation tissue    % Wound base Red or Granulating  100%    % Wound base Yellow/Fibrinous Exudate  0%    Peri-wound Assessment  Intact;Edema    Wound Length (cm)  1.3 cm    Wound Width (cm)  0.3 cm    Wound Depth (cm)  0 cm    Wound Volume (cm^3)  0 cm^3    Wound Surface Area (cm^2)  0.39 cm^2    Margins  Unattached edges (unapproximated)    Drainage Amount  Scant    Drainage Description  Sanguineous    Treatment  Cleansed;Debridement (Selective)    Wound Properties Date First Assessed: 01/02/19 Time First Assessed: 1109 Wound Type: Other (Comment) Location: Leg Location Orientation: Left Wound Description (Comments): superior to initial wound Present on Admission: No   Dressing Type  Impregnated gauze (bismuth)    Dressing Changed  Changed    Dressing Status  Clean;Dry;Intact    % Wound base Red or Granulating  100%    % Wound base Yellow/Fibrinous Exudate  0%  Wound Length (cm)  0.3 cm    Wound Width (cm)  0.3 cm    Wound Depth (cm)  0 cm    Wound Volume (cm^3)  0 cm^3    Wound Surface Area (cm^2)  0.09 cm^2    Drainage Amount  Scant    Drainage Description  Serosanguineous    Treatment  Cleansed    Selective Debridement - Location  wound bed, perimeter    Selective Debridement - Tools Used  Forceps    Selective Debridement - Tissue Removed  slough and devitalized tissues     Wound Therapy - Clinical Statement  pt slightly smaller this session with very little drainage. Most superior wound nearly healed entirely.  No debridement done superior and only outtermost edges of initial wound.  Cleansed well and continued with xeroform and gauze.  Still have not heard back from MD at this point regarding referral to [facility].      Hydrotherapy Plan  Debridement;Dressing change;Patient/family  education;Pulsatile lavage with suction    Wound Therapy - Frequency  2X / week    Wound Therapy - Current Recommendations  PT    Wound Plan  cleanse and debride, return to laser use if necessary     Dressing    xeroform, gauze, medipore     Dressing  Superior: xeroform, gauze, medipore                 PT Short Term Goals - 01/29/19 1157      PT SHORT TERM GOAL #1   Title  PT wound to be 100% granulated to decrease cellulitis    Time  2    Period  Weeks    Status  Achieved      PT SHORT TERM GOAL #2   Title  Pt pain to be no greater than a 4/10 to allow pt to be able to tolerate standing/walking for up  to 30 minutes without increased pain for improved functional ability.     Time  2    Period  Weeks    Status  Achieved      PT SHORT TERM GOAL #3   Title  Patient will recieve silver nitrate to treat inferior wound margins to improve wound approximation    Time  2    Period  Days    Status  Deferred    Target Date  01/12/19        PT Long Term Goals - 01/29/19 1158      PT LONG TERM GOAL #1   Title  Wound size to be decreased to no greater than 1.5x1.5 cm to allow pt to feel comfortable to complete self care for wound.     Time  4    Period  Weeks    Status  Achieved      PT LONG TERM GOAL #2   Title  Pt to be able to vocalize the benefits of wearing compression garments for varicosities.     Time  4    Period  Weeks    Status  On-going      PT LONG TERM GOAL #3   Title  Patient's wounds to fully approximate with no signs of infection or decreased skin integrity.    Time  6    Period  Weeks    Status  On-going              Patient will benefit from skilled therapeutic intervention in order to improve the following  deficits and impairments:     Visit Diagnosis: Open leg wound, right, sequela  Pain in right lower leg     Problem List Patient Active Problem List   Diagnosis Date Noted  . Rash and nonspecific skin eruption 04/06/2015  .  Acute blood loss anemia 03/22/2015  . Edema 03/21/2015  . Acute renal failure syndrome ([place])   . Closed right hip fracture ([place]) 03/14/2015  . Rheumatoid arthritis ([place]) 03/14/2015  . Fall 03/14/2015   Teena Irani, PTA/CLT [phone]  Teena Irani 02/12/2019, 12:36 PM  [place], [name], [address] Phone: [phone]   Fax:  [phone]  Name: RENLEIGH MEDIC MRN: [id number] Date of Birth: [birth year]

## 2019-02-15 ENCOUNTER — Ambulatory Visit (HOSPITAL_COMMUNITY): Payer: Medicare Other | Admitting: Physical Therapy

## 2019-02-15 ENCOUNTER — Other Ambulatory Visit: Payer: Self-pay

## 2019-02-15 DIAGNOSIS — S81801S Unspecified open wound, right lower leg, sequela: Secondary | ICD-10-CM | POA: Diagnosis not present

## 2019-02-15 DIAGNOSIS — M79661 Pain in right lower leg: Secondary | ICD-10-CM | POA: Diagnosis not present

## 2019-02-15 NOTE — Therapy (Signed)
[place], [name], [address] Phone: [phone]   Fax:  [phone]  Wound Care Therapy  Patient Details  Name: Michaela Simpson MRN: [id number] Date of Birth: 11-17-1931 Referring Provider (PT): Dr. Allyn Kenner   Encounter Date: 02/15/2019  PT End of Session - 02/15/19 1524    Visit Number  77    Number of Visits  51    Date for PT Re-Evaluation  02/21/19    Authorization Type  Medicare Part A & B    Authorization Time Period  08/03/18- 09/02/18; 09/04/2018 - 10/02/2018; 09/25/18-10/16/18; 10/16/18-10/30/18; 10/30/18-11/27/18; 11/24/2018-12/22/2018; NEW: 12/26/18-01/09/19; 01/10/19-02/21/19    Authorization - Visit Number  5    Authorization - Number of Visits  10    PT Start Time  1507    PT Stop Time  1520    PT Time Calculation (min)  13 min    Activity Tolerance  Patient tolerated treatment well    Behavior During Therapy  [facility] for tasks assessed/performed       Past Medical History:  Diagnosis Date  . Osteoporosis   . Rheumatoid arthritis [facility])     Past Surgical History:  Procedure Laterality Date  . INTRAMEDULLARY (IM) NAIL INTERTROCHANTERIC Right 03/14/2015   Procedure: RIGHT INTERTROCHANTRIC INTRAMEDULLARY (IM) NAIL ;  Surgeon: Mcarthur Rossetti, MD;  Location: [place];  Service: Orthopedics;  Laterality: Right;    There were no vitals filed for this visit.              Wound Therapy - 02/15/19 1522    Subjective  pt states she did not disturb or add to her bandage.     Patient and Family Stated Goals  wound to heal     Date of Onset  05/05/18    Prior Treatments  self care, antibiotics     Pain Scale  0-10    Pain Score  0-No pain    Evaluation and Treatment Procedures Explained to Patient/Family  Yes    Evaluation and Treatment Procedures  agreed to    Wound Properties Date First Assessed: 08/03/18 Time First Assessed: 0949 Wound Type: Puncture Location: Leg Location Orientation: Right Present  on Admission: Yes   Dressing Type  Impregnated gauze (bismuth);Gauze (Comment)    Dressing Changed  Changed    Dressing Status  Old drainage    Dressing Change Frequency  PRN    Site / Wound Assessment  Granulation tissue    % Wound base Red or Granulating  100%    % Wound base Yellow/Fibrinous Exudate  0%    Peri-wound Assessment  Intact;Edema    Margins  Unattached edges (unapproximated)    Drainage Amount  Scant    Drainage Description  Sanguineous    Treatment  Cleansed    Wound Properties Date First Assessed: 01/02/19 Time First Assessed: 1109 Wound Type: Other (Comment) Location: Leg Location Orientation: Left Wound Description (Comments): superior to initial wound Present on Admission: No   Dressing Type  Impregnated gauze (bismuth)    Dressing Status  Clean;Dry;Intact    % Wound base Red or Granulating  100%    % Wound base Yellow/Fibrinous Exudate  0%    Drainage Amount  Scant    Drainage Description  Serosanguineous    Treatment  Cleansed;Debridement (Selective)    Selective Debridement - Location  wound bed, perimeter    Selective Debridement - Tools Used  Forceps    Selective Debridement - Tissue Removed  slough and devitalized tissues     Wound Therapy - Clinical Statement  No significant changes this session.  Removed dry skin from more superior wound to reveal intact borders.  Overall slow progression, however less redness and no additional blistering or areas noted.  Contineud with same wound dressings.    Hydrotherapy Plan  Debridement;Dressing change;Patient/family education;Pulsatile lavage with suction    Wound Therapy - Frequency  2X / week    Wound Therapy - Current Recommendations  PT    Wound Plan  cleanse and debride, return to laser use if necessary     Dressing    xeroform, gauze, medipore     Dressing  Superior: xeroform, gauze, medipore                 PT Short Term Goals - 01/29/19 1157      PT SHORT TERM GOAL #1   Title  PT wound to be 100%  granulated to decrease cellulitis    Time  2    Period  Weeks    Status  Achieved      PT SHORT TERM GOAL #2   Title  Pt pain to be no greater than a 4/10 to allow pt to be able to tolerate standing/walking for up  to 30 minutes without increased pain for improved functional ability.     Time  2    Period  Weeks    Status  Achieved      PT SHORT TERM GOAL #3   Title  Patient will recieve silver nitrate to treat inferior wound margins to improve wound approximation    Time  2    Period  Days    Status  Deferred    Target Date  01/12/19        PT Long Term Goals - 01/29/19 1158      PT LONG TERM GOAL #1   Title  Wound size to be decreased to no greater than 1.5x1.5 cm to allow pt to feel comfortable to complete self care for wound.     Time  4    Period  Weeks    Status  Achieved      PT LONG TERM GOAL #2   Title  Pt to be able to vocalize the benefits of wearing compression garments for varicosities.     Time  4    Period  Weeks    Status  On-going      PT LONG TERM GOAL #3   Title  Patient's wounds to fully approximate with no signs of infection or decreased skin integrity.    Time  6    Period  Weeks    Status  On-going              Patient will benefit from skilled therapeutic intervention in order to improve the following deficits and impairments:     Visit Diagnosis: Pain in right lower leg  Open leg wound, right, sequela     Problem List Patient Active Problem List   Diagnosis Date Noted  . Rash and nonspecific skin eruption 04/06/2015  . Acute blood loss anemia 03/22/2015  . Edema 03/21/2015  . Acute renal failure syndrome ([place])   . Closed right hip fracture ([place]) 03/14/2015  . Rheumatoid arthritis ([place]) 03/14/2015  . Fall 03/14/2015   Teena Irani, PTA/CLT [phone]  Teena Irani 02/15/2019, 3:25 PM  [place], [name], [address]  Phone: [phone]   Fax:   [phone]  Name: Michaela Simpson MRN: [id number] Date of Birth: 08/01/1931

## 2019-02-19 ENCOUNTER — Ambulatory Visit (HOSPITAL_COMMUNITY): Payer: Medicare Other | Admitting: Physical Therapy

## 2019-02-19 ENCOUNTER — Other Ambulatory Visit: Payer: Self-pay

## 2019-02-19 DIAGNOSIS — M79661 Pain in right lower leg: Secondary | ICD-10-CM | POA: Diagnosis not present

## 2019-02-19 DIAGNOSIS — S81801S Unspecified open wound, right lower leg, sequela: Secondary | ICD-10-CM | POA: Diagnosis not present

## 2019-02-19 NOTE — Therapy (Signed)
[place], [name], [address] Phone: [phone]   Fax:  [phone]  Wound Care Therapy  Patient Details  Name: ROXANA LUKE MRN: [id number] Date of Birth: 03-23-1932 Referring Provider (PT): Dr. Allyn Kenner   Encounter Date: 02/19/2019  PT End of Session - 02/19/19 1431    Visit Number  13    Number of Visits  55    Date for PT Re-Evaluation  02/21/19    Authorization Type  Medicare Part A & B    Authorization Time Period  08/03/18- 09/02/18; 09/04/2018 - 10/02/2018; 09/25/18-10/16/18; 10/16/18-10/30/18; 10/30/18-11/27/18; 11/24/2018-12/22/2018; NEW: 12/26/18-01/09/19; 01/10/19-02/21/19    Authorization - Visit Number  6    Authorization - Number of Visits  10    PT Start Time  [id number]    PT Stop Time  1105    PT Time Calculation (min)  20 min    Activity Tolerance  Patient tolerated treatment well    Behavior During Therapy  [facility] for tasks assessed/performed       Past Medical History:  Diagnosis Date  . Osteoporosis   . Rheumatoid arthritis [facility])     Past Surgical History:  Procedure Laterality Date  . INTRAMEDULLARY (IM) NAIL INTERTROCHANTERIC Right 03/14/2015   Procedure: RIGHT INTERTROCHANTRIC INTRAMEDULLARY (IM) NAIL ;  Surgeon: Mcarthur Rossetti, MD;  Location: [place];  Service: Orthopedics;  Laterality: Right;    There were no vitals filed for this visit.              Wound Therapy - 02/19/19 1414    Subjective  pt states "it didn't talk to me" after last visit.  STates it usually irritates her the rest of the day after leaving here but last time was the first time it has'nt.     Patient and Family Stated Goals  wound to heal     Date of Onset  05/05/18    Prior Treatments  self care, antibiotics     Pain Scale  0-10    Pain Score  0-No pain    Evaluation and Treatment Procedures Explained to Patient/Family  Yes    Evaluation and Treatment Procedures  agreed to    Wound Properties Date First  Assessed: 08/03/18 Time First Assessed: 0949 Wound Type: Puncture Location: Leg Location Orientation: Right Present on Admission: Yes   Dressing Type  Impregnated gauze (bismuth);Gauze (Comment)    Dressing Changed  Changed    Dressing Status  Old drainage    Dressing Change Frequency  PRN    Site / Wound Assessment  Granulation tissue    % Wound base Red or Granulating  100%    % Wound base Yellow/Fibrinous Exudate  0%    Peri-wound Assessment  Intact;Edema    Margins  Unattached edges (unapproximated)    Drainage Amount  Scant    Drainage Description  Sanguineous    Treatment  Cleansed    Wound Properties Date First Assessed: 01/02/19 Time First Assessed: 1109 Wound Type: Other (Comment) Location: Leg Location Orientation: Left Wound Description (Comments): superior to initial wound Present on Admission: No   Dressing Type  Impregnated gauze (bismuth)    Dressing Status  Clean;Dry;Intact    % Wound base Red or Granulating  100%    % Wound base Yellow/Fibrinous Exudate  0%    Drainage Amount  Scant    Drainage Description  Serosanguineous    Treatment  Cleansed    Selective Debridement - Location  wound bed, perimeter    Selective Debridement - Tools Used  Forceps    Selective Debridement - Tissue Removed  slough and devitalized tissues     Wound Therapy - Clinical Statement  dressing partially adherent to woundbed so debrided away when dressing removed.  Appears to have approximated slightly since last session.  Cleansed well and continued with xerform.  Pt pleased today with how much better it is feeling and looking.     Hydrotherapy Plan  Debridement;Dressing change;Patient/family education;Pulsatile lavage with suction    Wound Therapy - Frequency  2X / week    Wound Therapy - Current Recommendations  PT    Wound Plan  cleanse and debride, return to laser use if necessary     Dressing    xeroform, gauze, medipore     Dressing  Superior: xeroform, gauze, medipore                  PT Short Term Goals - 01/29/19 1157      PT SHORT TERM GOAL #1   Title  PT wound to be 100% granulated to decrease cellulitis    Time  2    Period  Weeks    Status  Achieved      PT SHORT TERM GOAL #2   Title  Pt pain to be no greater than a 4/10 to allow pt to be able to tolerate standing/walking for up  to 30 minutes without increased pain for improved functional ability.     Time  2    Period  Weeks    Status  Achieved      PT SHORT TERM GOAL #3   Title  Patient will recieve silver nitrate to treat inferior wound margins to improve wound approximation    Time  2    Period  Days    Status  Deferred    Target Date  01/12/19        PT Long Term Goals - 01/29/19 1158      PT LONG TERM GOAL #1   Title  Wound size to be decreased to no greater than 1.5x1.5 cm to allow pt to feel comfortable to complete self care for wound.     Time  4    Period  Weeks    Status  Achieved      PT LONG TERM GOAL #2   Title  Pt to be able to vocalize the benefits of wearing compression garments for varicosities.     Time  4    Period  Weeks    Status  On-going      PT LONG TERM GOAL #3   Title  Patient's wounds to fully approximate with no signs of infection or decreased skin integrity.    Time  6    Period  Weeks    Status  On-going              Patient will benefit from skilled therapeutic intervention in order to improve the following deficits and impairments:     Visit Diagnosis: Open leg wound, right, sequela     Problem List Patient Active Problem List   Diagnosis Date Noted  . Rash and nonspecific skin eruption 04/06/2015  . Acute blood loss anemia 03/22/2015  . Edema 03/21/2015  . Acute renal failure syndrome ([place])   . Closed right hip fracture ([place]) 03/14/2015  . Rheumatoid arthritis ([place]) 03/14/2015  . Fall 03/14/2015   Teena Irani, PTA/CLT [place],  Daxton Nydam B 02/19/2019, 2:33 PM  [place] [address] [place], [name], [address] Phone: [phone]   Fax:  [phone]  Name: SREEJA BRANDELL MRN: [id number] Date of Birth: 14-Nov-1931

## 2019-02-22 ENCOUNTER — Other Ambulatory Visit: Payer: Self-pay

## 2019-02-22 ENCOUNTER — Ambulatory Visit (HOSPITAL_COMMUNITY): Payer: Medicare Other | Attending: Internal Medicine | Admitting: Physical Therapy

## 2019-02-22 DIAGNOSIS — M1990 Unspecified osteoarthritis, unspecified site: Secondary | ICD-10-CM | POA: Diagnosis not present

## 2019-02-22 DIAGNOSIS — M79661 Pain in right lower leg: Secondary | ICD-10-CM | POA: Diagnosis not present

## 2019-02-22 DIAGNOSIS — S81801S Unspecified open wound, right lower leg, sequela: Secondary | ICD-10-CM | POA: Diagnosis not present

## 2019-02-22 DIAGNOSIS — M25541 Pain in joints of right hand: Secondary | ICD-10-CM | POA: Diagnosis not present

## 2019-02-22 DIAGNOSIS — M25542 Pain in joints of left hand: Secondary | ICD-10-CM | POA: Diagnosis not present

## 2019-02-22 NOTE — Therapy (Addendum)
[place]a [place], [name], [address] Phone: [phone]   Fax:  [phone]  Wound Care Therapy & Progress Note  Patient Details  Name: Michaela Simpson MRN: [id number] Date of Birth: 04-Sep-1931 Referring Provider (PT): Dr. Allyn Kenner   Encounter Date: 02/22/2019   Progress Note Reporting Period  to 02/22/19  See note below for Objective Data and Assessment of Progress/Goals.    PT End of Session - 02/22/19 1153    Visit Number  9    Number of Visits  38    Date for PT Re-Evaluation  02/21/19    Authorization Type  Medicare Part A & B    Authorization Time Period  08/03/18- 09/02/18; 09/04/2018 - 10/02/2018; 09/25/18-10/16/18; 10/16/18-10/30/18; 10/30/18-11/27/18; 11/24/2018-12/22/2018; NEW: 12/26/18-01/09/19; 01/10/19-02/21/19    Authorization - Visit Number  7    Authorization - Number of Visits  10    PT Start Time  [id number]    PT Stop Time  1105    PT Time Calculation (min)  20 min    Activity Tolerance  Patient tolerated treatment well    Behavior During Therapy  [facility] for tasks assessed/performed       Past Medical History:  Diagnosis Date  . Osteoporosis   . Rheumatoid arthritis [facility])     Past Surgical History:  Procedure Laterality Date  . INTRAMEDULLARY (IM) NAIL INTERTROCHANTERIC Right 03/14/2015   Procedure: RIGHT INTERTROCHANTRIC INTRAMEDULLARY (IM) NAIL ;  Surgeon: Mcarthur Rossetti, MD;  Location: [facility];  Service: Orthopedics;  Laterality: Right;    There were no vitals filed for this visit.              Wound Therapy - 02/22/19 1151    Subjective  pt states it still feels good    Patient and Family Stated Goals  wound to heal     Date of Onset  05/05/18    Prior Treatments  self care, antibiotics     Pain Scale  0-10    Pain Score  0-No pain    Evaluation and Treatment Procedures Explained to Patient/Family  Yes    Evaluation and Treatment Procedures  agreed to    Wound Properties Date First  Assessed: 08/03/18 Time First Assessed: 0949 Wound Type: Puncture Location: Leg Location Orientation: Right Present on Admission: Yes   Dressing Type  Impregnated gauze (bismuth);Gauze (Comment)    Dressing Changed  Changed    Dressing Status  Old drainage    Dressing Change Frequency  PRN    Site / Wound Assessment  Granulation tissue    % Wound base Red or Granulating  100%    % Wound base Yellow/Fibrinous Exudate  0%    Peri-wound Assessment  Intact;Edema    Wound Length (cm)  1.3 cm    Wound Width (cm)  0.3 cm    Wound Depth (cm)  0 cm    Wound Volume (cm^3)  0 cm^3    Wound Surface Area (cm^2)  0.39 cm^2    Margins  Unattached edges (unapproximated)    Drainage Amount  Scant    Drainage Description  Sanguineous    Treatment  Cleansed;Debridement (Selective)    Wound Properties Date First Assessed: 01/02/19 Time First Assessed: 1109 Wound Type: Other (Comment) Location: Leg Location Orientation: Left Wound Description (Comments): superior to initial wound Present on Admission: No   Dressing Type  Impregnated gauze (bismuth)    Dressing Changed  Other (Comment)   no dressing  applied   Dressing Status  Clean;Dry;Intact    % Wound base Red or Granulating  100%    % Wound base Yellow/Fibrinous Exudate  0%    Wound Length (cm)  0 cm    Wound Width (cm)  0 cm    Wound Depth (cm)  0 cm    Wound Volume (cm^3)  0 cm^3    Wound Surface Area (cm^2)  0 cm^2    Drainage Amount  None    Treatment  Cleansed    Selective Debridement - Location  wound bed, perimeter    Selective Debridement - Tools Used  Forceps    Selective Debridement - Tissue Removed  slough and devitalized tissues     Wound Therapy - Clinical Statement  remeasued this session with supierior wound totally closed at this point and primary wound still with little change. Appears to be thinner in some places and approximtating, however doing slowly.  Current wound treatment seems to be working.  Will continue to remeasure and  assess every visit.    Hydrotherapy Plan  Debridement;Dressing change;Patient/family education;Pulsatile lavage with suction    Wound Therapy - Frequency  2X / week    Wound Therapy - Current Recommendations  PT    Wound Plan  cleanse and debride, return to laser use if necessary     Dressing   primary wound:  xeroform, gauze, medipore     Dressing  Superior: none                PT Short Term Goals - 01/29/19 1157      PT SHORT TERM GOAL #1   Title  PT wound to be 100% granulated to decrease cellulitis    Time  2    Period  Weeks    Status  Achieved      PT SHORT TERM GOAL #2   Title  Pt pain to be no greater than a 4/10 to allow pt to be able to tolerate standing/walking for up  to 30 minutes without increased pain for improved functional ability.     Time  2    Period  Weeks    Status  Achieved      PT SHORT TERM GOAL #3   Title  Patient will recieve silver nitrate to treat inferior wound margins to improve wound approximation    Time  2    Period  Days    Status  Deferred    Target Date  01/12/19        PT Long Term Goals - 01/29/19 1158      PT LONG TERM GOAL #1   Title  Wound size to be decreased to no greater than 1.5x1.5 cm to allow pt to feel comfortable to complete self care for wound.     Time  4    Period  Weeks    Status  Achieved      PT LONG TERM GOAL #2   Title  Pt to be able to vocalize the benefits of wearing compression garments for varicosities.     Time  4    Period  Weeks    Status  On-going      PT LONG TERM GOAL #3   Title  Patient's wounds to fully approximate with no signs of infection or decreased skin integrity.    Time  6    Period  Weeks    Status  On-going  Patient will benefit from skilled therapeutic intervention in order to improve the following deficits and impairments:     Visit Diagnosis: Pain in right lower leg  Open leg wound, right, sequela     Problem List Patient Active Problem List    Diagnosis Date Noted  . Rash and nonspecific skin eruption 04/06/2015  . Acute blood loss anemia 03/22/2015  . Edema 03/21/2015  . Acute renal failure syndrome ([place])   . Closed right hip fracture ([place]) 03/14/2015  . Rheumatoid arthritis ([place]) 03/14/2015  . Fall 03/14/2015   Michaela Simpson, PTA/CLT [phone]  Michaela Simpson 02/22/2019, 11:57 AM  [place], [name], [address] Phone: [phone]   Fax:  [phone]  Name: JULIE RANFT MRN: [id number] Date of Birth: Nov 14, 1931

## 2019-02-27 ENCOUNTER — Encounter (HOSPITAL_COMMUNITY): Payer: Self-pay

## 2019-02-27 ENCOUNTER — Ambulatory Visit (HOSPITAL_COMMUNITY): Payer: Medicare Other

## 2019-02-27 ENCOUNTER — Other Ambulatory Visit: Payer: Self-pay

## 2019-02-27 DIAGNOSIS — S81801S Unspecified open wound, right lower leg, sequela: Secondary | ICD-10-CM | POA: Diagnosis not present

## 2019-02-27 DIAGNOSIS — M79661 Pain in right lower leg: Secondary | ICD-10-CM

## 2019-02-27 NOTE — Addendum Note (Signed)
Addended by: Kipp Brood E on: 02/27/2019 11:02 AM   Modules accepted: Orders

## 2019-02-27 NOTE — Therapy (Signed)
[place], [name], [address] Phone: [phone]   Fax:  [phone]  Wound Care Therapy  Patient Details  Name: Michaela Simpson MRN: [id number] Date of Birth: 1931/07/09 Referring Provider (PT): Dr. Allyn Kenner   Encounter Date: 02/27/2019  PT End of Session - 02/27/19 1056    Visit Number  44    Number of Visits  51    Date for PT Re-Evaluation  03/22/19    Authorization Type  Medicare Part A & B    Authorization Time Period  08/03/18- 09/02/18; 09/04/2018 - 10/02/2018; 09/25/18-10/16/18; 10/16/18-10/30/18; 10/30/18-11/27/18; 11/24/2018-12/22/2018; 12/26/18-01/09/19; 01/10/19-02/21/19; NEW: 02/22/19-03/22/19    Authorization - Visit Number  2    Authorization - Number of Visits  10    PT Start Time  1004    PT Stop Time  1022    PT Time Calculation (min)  18 min    Activity Tolerance  Patient tolerated treatment well    Behavior During Therapy  [facility] for tasks assessed/performed       Past Medical History:  Diagnosis Date  . Osteoporosis   . Rheumatoid arthritis [facility])     Past Surgical History:  Procedure Laterality Date  . INTRAMEDULLARY (IM) NAIL INTERTROCHANTERIC Right 03/14/2015   Procedure: RIGHT INTERTROCHANTRIC INTRAMEDULLARY (IM) NAIL ;  Surgeon: Mcarthur Rossetti, MD;  Location: [place];  Service: Orthopedics;  Laterality: Right;    There were no vitals filed for this visit.              Wound Therapy - 02/27/19 1039    Subjective  Patient arrives reporting no pain and stated she doesn't want any debridemnt     Patient and Family Stated Goals  wound to heal     Date of Onset  05/05/18    Prior Treatments  self care, antibiotics     Pain Scale  0-10    Pain Score  0-No pain    Evaluation and Treatment Procedures Explained to Patient/Family  Yes    Evaluation and Treatment Procedures  agreed to   pt did not want debridement   Wound Properties Date First Assessed: 08/03/18 Time First Assessed: 0949  Wound Type: Puncture Location: Leg Location Orientation: Right Present on Admission: Yes   Dressing Type  Impregnated gauze (bismuth);Gauze (Comment)    Dressing Status  Old drainage    Dressing Change Frequency  PRN    Site / Wound Assessment  Granulation tissue    % Wound base Red or Granulating  100%    % Wound base Yellow/Fibrinous Exudate  0%    Peri-wound Assessment  Intact;Edema    Wound Length (cm)  1.5 cm   1.3   Wound Width (cm)  0.6 cm   0.3   Wound Depth (cm)  0.2 cm   0   Wound Volume (cm^3)  0.18 cm^3    Wound Surface Area (cm^2)  0.9 cm^2    Margins  Unattached edges (unapproximated)    Drainage Amount  Scant    Drainage Description  Sanguineous    Treatment  Cleansed    Wound Properties Date First Assessed: 01/02/19 Time First Assessed: 1109 Wound Type: Other (Comment) Location: Leg Location Orientation: Left Wound Description (Comments): superior to initial wound Present on Admission: No   Dressing Type  Impregnated gauze (bismuth)    Dressing Status  Clean;Dry;Intact    % Wound base Red or Granulating  100%    % Wound base Yellow/Fibrinous  Exudate  0%    Drainage Amount  None    Selective Debridement - Location  wound bed, perimeter    Selective Debridement - Tools Used  Forceps    Selective Debridement - Tissue Removed  slough and devitalized tissues     Wound Therapy - Clinical Statement  Patient's wound appears to be slightly increased in size compared to last measurements. She continues to have small spots within periwound that appear to have white drainage underlying the top thin layer of skin. She declined any debridement today and wound was cleansed. Patient was educated on need to potentially follow up with Dr. Nevada Crane and referral to wound center in [place]. Educated that if her wound continues to not heal or improve she will need to seek care at another wound office with and MD present. Will continue to monitor wound progress with healing and treat or refer as  appropriate.    Hydrotherapy Plan  Debridement;Dressing change;Patient/family education;Pulsatile lavage with suction    Wound Therapy - Frequency  2X / week    Wound Therapy - Current Recommendations  PT    Wound Plan  cleanse and debride, return to laser use if necessary     Dressing   primary wound:  xeroform, gauze, medipore     Dressing  Superior: none           PT Short Term Goals - 01/29/19 1157      PT SHORT TERM GOAL #1   Title  PT wound to be 100% granulated to decrease cellulitis    Time  2    Period  Weeks    Status  Achieved      PT SHORT TERM GOAL #2   Title  Pt pain to be no greater than a 4/10 to allow pt to be able to tolerate standing/walking for up  to 30 minutes without increased pain for improved functional ability.     Time  2    Period  Weeks    Status  Achieved      PT SHORT TERM GOAL #3   Title  Patient will recieve silver nitrate to treat inferior wound margins to improve wound approximation    Time  2    Period  Days    Status  Deferred    Target Date  01/12/19        PT Long Term Goals - 01/29/19 1158      PT LONG TERM GOAL #1   Title  Wound size to be decreased to no greater than 1.5x1.5 cm to allow pt to feel comfortable to complete self care for wound.     Time  4    Period  Weeks    Status  Achieved      PT LONG TERM GOAL #2   Title  Pt to be able to vocalize the benefits of wearing compression garments for varicosities.     Time  4    Period  Weeks    Status  On-going      PT LONG TERM GOAL #3   Title  Patient's wounds to fully approximate with no signs of infection or decreased skin integrity.    Time  6    Period  Weeks    Status  On-going         Plan - 02/27/19 1104    Clinical Impression Statement  see above    Rehab Potential  Good    PT Frequency  2x / week  PT Duration  6 weeks   4 more   PT Treatment/Interventions  ADLs/Self Care Home Management;Patient/family education;Other (comment)   debridement and  dressing change    PT Next Visit Plan  see above    Consulted and Agree with Plan of Care  Patient       Patient will benefit from skilled therapeutic intervention in order to improve the following deficits and impairments:  Decreased skin integrity, Pain  Visit Diagnosis: Pain in right lower leg  Open leg wound, right, sequela     Problem List Patient Active Problem List   Diagnosis Date Noted  . Rash and nonspecific skin eruption 04/06/2015  . Acute blood loss anemia 03/22/2015  . Edema 03/21/2015  . Acute renal failure syndrome ([place])   . Closed right hip fracture ([place]) 03/14/2015  . Rheumatoid arthritis ([place]) 03/14/2015  . Fall 03/14/2015    Kipp Brood, PT, DPT, [facility] Physical Therapist with [facility]  02/27/2019 11:04 AM    [place], [name], [address] Phone: [phone]   Fax:  [phone]  Name: Michaela Simpson MRN: [id number] Date of Birth: Mar 07, 1932

## 2019-03-01 ENCOUNTER — Ambulatory Visit (HOSPITAL_COMMUNITY): Payer: Medicare Other | Admitting: Physical Therapy

## 2019-03-01 ENCOUNTER — Other Ambulatory Visit: Payer: Self-pay

## 2019-03-01 DIAGNOSIS — M79661 Pain in right lower leg: Secondary | ICD-10-CM

## 2019-03-01 DIAGNOSIS — S81801S Unspecified open wound, right lower leg, sequela: Secondary | ICD-10-CM

## 2019-03-01 NOTE — Therapy (Signed)
[place], [name], [address] Phone: [phone]   Fax:  [phone]  Wound Care Therapy  Patient Details  Name: Michaela Simpson MRN: [id number] Date of Birth: [birth year] Referring Provider (PT): Dr. Allyn Kenner   Encounter Date: 03/01/2019  PT End of Session - 03/01/19 1159    Visit Number  37    Number of Visits  34    Date for PT Re-Evaluation  03/22/19    Authorization Type  Medicare Part A & B    Authorization Time Period  08/03/18- 09/02/18; 09/04/2018 - 10/02/2018; 09/25/18-10/16/18; 10/16/18-10/30/18; 10/30/18-11/27/18; 11/24/2018-12/22/2018; 12/26/18-01/09/19; 01/10/19-02/21/19; NEW: 02/22/19-03/22/19    Authorization - Visit Number  2    Authorization - Number of Visits  10    PT Start Time  1135    PT Stop Time  1155    PT Time Calculation (min)  20 min    Activity Tolerance  Patient tolerated treatment well    Behavior During Therapy  [facility] for tasks assessed/performed       Past Medical History:  Diagnosis Date  . Osteoporosis   . Rheumatoid arthritis [facility])     Past Surgical History:  Procedure Laterality Date  . INTRAMEDULLARY (IM) NAIL INTERTROCHANTERIC Right 03/14/2015   Procedure: RIGHT INTERTROCHANTRIC INTRAMEDULLARY (IM) NAIL ;  Surgeon: Mcarthur Rossetti, MD;  Location: [facility];  Service: Orthopedics;  Laterality: Right;    There were no vitals filed for this visit.              Wound Therapy - 03/01/19 1157    Subjective  Patient arrives reporting no pain and stated she doesn't want any debridemnt     Patient and Family Stated Goals  wound to heal     Date of Onset  05/05/18    Prior Treatments  self care, antibiotics     Evaluation and Treatment Procedures Explained to Patient/Family  Yes    Evaluation and Treatment Procedures  agreed to   pt did not want debridement   Wound Properties Date First Assessed: 08/03/18 Time First Assessed: 0949 Wound Type: Puncture Location: Leg Location  Orientation: Right Present on Admission: Yes   Dressing Type  Impregnated gauze (bismuth);Gauze (Comment)    Dressing Changed  Reinforced    Dressing Status  Old drainage    Dressing Change Frequency  PRN    Site / Wound Assessment  Granulation tissue    % Wound base Red or Granulating  100%    % Wound base Yellow/Fibrinous Exudate  0%    Peri-wound Assessment  Intact;Edema    Margins  Unattached edges (unapproximated)    Drainage Amount  Scant    Drainage Description  Sanguineous    Treatment  Debridement (Selective);Cleansed    Selective Debridement - Location  wound bed, perimeter    Selective Debridement - Tools Used  Forceps    Selective Debridement - Tissue Removed  slough and devitalized tissues     Wound Therapy - Clinical Statement  no significant change from last session  Informed pt we would revisit on Monday and decide if she needs to go back to MD dependending on how looking.  Changed from tape dressing to gauze dressing this session secured with #5 netting.    Hydrotherapy Plan  Debridement;Dressing change;Patient/family education;Pulsatile lavage with suction    Wound Therapy - Frequency  2X / week    Wound Therapy - Current Recommendations  PT    Wound Plan  cleanse and debride, return to laser use if necessary     Dressing   primary wound:  xeroform, gauze, medipore     Dressing  Superior: none                PT Short Term Goals - 01/29/19 1157      PT SHORT TERM GOAL #1   Title  PT wound to be 100% granulated to decrease cellulitis    Time  2    Period  Weeks    Status  Achieved      PT SHORT TERM GOAL #2   Title  Pt pain to be no greater than a 4/10 to allow pt to be able to tolerate standing/walking for up  to 30 minutes without increased pain for improved functional ability.     Time  2    Period  Weeks    Status  Achieved      PT SHORT TERM GOAL #3   Title  Patient will recieve silver nitrate to treat inferior wound margins to improve wound  approximation    Time  2    Period  Days    Status  Deferred    Target Date  01/12/19        PT Long Term Goals - 01/29/19 1158      PT LONG TERM GOAL #1   Title  Wound size to be decreased to no greater than 1.5x1.5 cm to allow pt to feel comfortable to complete self care for wound.     Time  4    Period  Weeks    Status  Achieved      PT LONG TERM GOAL #2   Title  Pt to be able to vocalize the benefits of wearing compression garments for varicosities.     Time  4    Period  Weeks    Status  On-going      PT LONG TERM GOAL #3   Title  Patient's wounds to fully approximate with no signs of infection or decreased skin integrity.    Time  6    Period  Weeks    Status  On-going              Patient will benefit from skilled therapeutic intervention in order to improve the following deficits and impairments:     Visit Diagnosis: Open leg wound, right, sequela  Pain in right lower leg     Problem List Patient Active Problem List   Diagnosis Date Noted  . Rash and nonspecific skin eruption 04/06/2015  . Acute blood loss anemia 03/22/2015  . Edema 03/21/2015  . Acute renal failure syndrome ([place])   . Closed right hip fracture ([place]) 03/14/2015  . Rheumatoid arthritis ([place]) 03/14/2015  . Fall 03/14/2015   Teena Irani, PTA/CLT [phone]  Teena Irani 03/01/2019, 11:59 AM  [place], [name], [address] Phone: [phone]   Fax:  [phone]  Name: TANGELA UCCELLO MRN: [id number] Date of Birth: Jan 31, 1932

## 2019-03-05 ENCOUNTER — Encounter

## 2019-03-05 ENCOUNTER — Ambulatory Visit (HOSPITAL_COMMUNITY): Payer: Medicare Other | Admitting: Physical Therapy

## 2019-03-05 ENCOUNTER — Telehealth (HOSPITAL_COMMUNITY): Payer: Self-pay | Admitting: Physical Therapy

## 2019-03-05 ENCOUNTER — Other Ambulatory Visit: Payer: Self-pay

## 2019-03-05 DIAGNOSIS — M79661 Pain in right lower leg: Secondary | ICD-10-CM

## 2019-03-05 DIAGNOSIS — S81801S Unspecified open wound, right lower leg, sequela: Secondary | ICD-10-CM

## 2019-03-05 NOTE — Telephone Encounter (Signed)
Called [name] to discuss setting up appointment for Michaela Simpson.  Facility is closed on Monday.  Left message for them to call her and set up appointment as she is no longer progressing here at our facility.   Teena Irani, PTA/CLT [phone]

## 2019-03-05 NOTE — Therapy (Signed)
[place], [name], [address] Phone: [phone]   Fax:  [phone]  Wound Care Therapy  Patient Details  Name: Michaela Simpson MRN: [id number] Date of Birth: [birth year] Referring Provider (PT): Dr. Allyn Kenner   Encounter Date: 03/05/2019    Past Medical History:  Diagnosis Date  . Osteoporosis   . Rheumatoid arthritis [facility])     Past Surgical History:  Procedure Laterality Date  . INTRAMEDULLARY (IM) NAIL INTERTROCHANTERIC Right 03/14/2015   Procedure: RIGHT INTERTROCHANTRIC INTRAMEDULLARY (IM) NAIL ;  Surgeon: Mcarthur Rossetti, MD;  Location: [facility];  Service: Orthopedics;  Laterality: Right;    There were no vitals filed for this visit.   Subjective Assessment - 03/05/19 1256    Patient is accompained by:  Interpreter                Wound Therapy - 03/05/19 1309    Subjective  pt states her leg has been"talking to her" so she knows its not good.  States [name] called had and suggests she finishes therapy here before transferring there.    Patient and Family Stated Goals  wound to heal     Date of Onset  05/05/18    Prior Treatments  self care, antibiotics     Evaluation and Treatment Procedures Explained to Patient/Family  Yes    Evaluation and Treatment Procedures  agreed to   pt did not want debridement   Wound Properties Date First Assessed: 08/03/18 Time First Assessed: 0949 Wound Type: Puncture Location: Leg Location Orientation: Right Present on Admission: Yes   Dressing Type  Impregnated gauze (bismuth);Gauze (Comment)    Dressing Changed  Changed    Dressing Status  Old drainage    Dressing Change Frequency  PRN    Site / Wound Assessment  Granulation tissue    % Wound base Red or Granulating  100%    % Wound base Yellow/Fibrinous Exudate  0%    Peri-wound Assessment  Intact;Edema    Wound Length (cm)  3.4 cm    Wound Width (cm)  3 cm    Wound Depth (cm)  0.1  cm    Wound Volume (cm^3)  1.02 cm^3    Wound Surface Area (cm^2)  10.2 cm^2    Margins  Unattached edges (unapproximated)    Drainage Amount  Scant    Drainage Description  Sanguineous    Treatment  Cleansed;Debridement (Selective)    Selective Debridement - Location  wound bed, perimeter    Selective Debridement - Tools Used  Forceps    Selective Debridement - Tissue Removed  slough and devitalized tissues     Wound Therapy - Clinical Statement  pt returns today with wound actually larger and overall worse as compared to last session.  Urged her contact [facility] and go ahead to transition her care there as her wound is no longer progressing here.  Therapist attempted to call, however faciliity is closed on Monday.  Message was left for them to call her and set appt.  Additionally, information given to pateint to call and schedule tomorrow as well.  Pt instructed to return here Thursday if she is not able to be seen again this week.  Pt verbalized understanding.      Hydrotherapy Plan  Debridement;Dressing change;Patient/family education;Pulsatile lavage with suction    Wound Therapy - Frequency  2X / week    Wound Therapy - Current Recommendations  PT  Wound Plan  Await further communication from pateint.  Will continue to see until care is transitioned to [facility] wound facility.     Dressing   primary wound:  xeroform, gauze, medipore     Dressing  Superior: none                PT Short Term Goals - 01/29/19 1157      PT SHORT TERM GOAL #1   Title  PT wound to be 100% granulated to decrease cellulitis    Time  2    Period  Weeks    Status  Achieved      PT SHORT TERM GOAL #2   Title  Pt pain to be no greater than a 4/10 to allow pt to be able to tolerate standing/walking for up  to 30 minutes without increased pain for improved functional ability.     Time  2    Period  Weeks    Status  Achieved      PT SHORT TERM GOAL #3   Title  Patient will recieve silver  nitrate to treat inferior wound margins to improve wound approximation    Time  2    Period  Days    Status  Deferred    Target Date  01/12/19        PT Long Term Goals - 01/29/19 1158      PT LONG TERM GOAL #1   Title  Wound size to be decreased to no greater than 1.5x1.5 cm to allow pt to feel comfortable to complete self care for wound.     Time  4    Period  Weeks    Status  Achieved      PT LONG TERM GOAL #2   Title  Pt to be able to vocalize the benefits of wearing compression garments for varicosities.     Time  4    Period  Weeks    Status  On-going      PT LONG TERM GOAL #3   Title  Patient's wounds to fully approximate with no signs of infection or decreased skin integrity.    Time  6    Period  Weeks    Status  On-going              Patient will benefit from skilled therapeutic intervention in order to improve the following deficits and impairments:     Visit Diagnosis: Open leg wound, right, sequela  Pain in right lower leg     Problem List Patient Active Problem List   Diagnosis Date Noted  . Rash and nonspecific skin eruption 04/06/2015  . Acute blood loss anemia 03/22/2015  . Edema 03/21/2015  . Acute renal failure syndrome ([place])   . Closed right hip fracture ([place]) 03/14/2015  . Rheumatoid arthritis ([place]) 03/14/2015  . Fall 03/14/2015   Teena Irani, PTA/CLT [phone]  Teena Irani 03/05/2019, 1:17 PM  [place] [address] [place], [name], [address] Phone: [phone]   Fax:  [phone]  Name: Michaela Simpson MRN: [id number] Date of Birth: 1931-10-07

## 2019-03-05 NOTE — Telephone Encounter (Signed)
Called pt she will be here at 11:30 today - she thought her apptment was tomorrow.

## 2019-03-06 ENCOUNTER — Telehealth: Payer: Self-pay | Admitting: Orthopedic Surgery

## 2019-03-06 NOTE — Telephone Encounter (Signed)
Ms. Keck called and left  Message asking for you to call her back.  Would you please do so?  Thanks Amy

## 2019-03-06 NOTE — Telephone Encounter (Signed)
I dont see where we have seen her I called her. No answer, not able to leave message.

## 2019-03-07 NOTE — Telephone Encounter (Signed)
Unable to reach, but I think she called wrong place, she was being treated by Brennon Otterness with Physical therapy, not me. Looks like Mahitha Hickling called her already on 03/05/19

## 2019-03-08 ENCOUNTER — Ambulatory Visit (HOSPITAL_COMMUNITY): Payer: Medicare Other | Admitting: Physical Therapy

## 2019-03-08 ENCOUNTER — Inpatient Hospital Stay (HOSPITAL_COMMUNITY)
Admission: AD | Admit: 2019-03-08 | Discharge: 2019-03-12 | DRG: 481 | Disposition: A | Discharge status: Skilled Nursing Facility | Payer: Medicare Other | Source: Other Acute Inpatient Hospital | Attending: Internal Medicine | Admitting: Internal Medicine

## 2019-03-08 ENCOUNTER — Encounter (HOSPITAL_COMMUNITY): Payer: Self-pay | Admitting: Internal Medicine

## 2019-03-08 DIAGNOSIS — S79912A Unspecified injury of left hip, initial encounter: Secondary | ICD-10-CM | POA: Diagnosis not present

## 2019-03-08 DIAGNOSIS — S72002A Fracture of unspecified part of neck of left femur, initial encounter for closed fracture: Secondary | ICD-10-CM | POA: Diagnosis not present

## 2019-03-08 DIAGNOSIS — F320 Major depressive disorder, single episode, mild: Secondary | ICD-10-CM | POA: Diagnosis not present

## 2019-03-08 DIAGNOSIS — F339 Major depressive disorder, recurrent, unspecified: Secondary | ICD-10-CM | POA: Diagnosis not present

## 2019-03-08 DIAGNOSIS — Z4789 Encounter for other orthopedic aftercare: Secondary | ICD-10-CM | POA: Diagnosis not present

## 2019-03-08 DIAGNOSIS — D62 Acute posthemorrhagic anemia: Secondary | ICD-10-CM | POA: Diagnosis not present

## 2019-03-08 DIAGNOSIS — Z419 Encounter for procedure for purposes other than remedying health state, unspecified: Secondary | ICD-10-CM

## 2019-03-08 DIAGNOSIS — N179 Acute kidney failure, unspecified: Secondary | ICD-10-CM | POA: Diagnosis present

## 2019-03-08 DIAGNOSIS — L97219 Non-pressure chronic ulcer of right calf with unspecified severity: Secondary | ICD-10-CM | POA: Diagnosis present

## 2019-03-08 DIAGNOSIS — S42295S Other nondisplaced fracture of upper end of left humerus, sequela: Secondary | ICD-10-CM | POA: Diagnosis not present

## 2019-03-08 DIAGNOSIS — Y92017 Garden or yard in single-family (private) house as the place of occurrence of the external cause: Secondary | ICD-10-CM

## 2019-03-08 DIAGNOSIS — I10 Essential (primary) hypertension: Secondary | ICD-10-CM | POA: Diagnosis not present

## 2019-03-08 DIAGNOSIS — Z23 Encounter for immunization: Secondary | ICD-10-CM

## 2019-03-08 DIAGNOSIS — S42202A Unspecified fracture of upper end of left humerus, initial encounter for closed fracture: Secondary | ICD-10-CM | POA: Diagnosis present

## 2019-03-08 DIAGNOSIS — Y93H2 Activity, gardening and landscaping: Secondary | ICD-10-CM

## 2019-03-08 DIAGNOSIS — R011 Cardiac murmur, unspecified: Secondary | ICD-10-CM | POA: Diagnosis not present

## 2019-03-08 DIAGNOSIS — M069 Rheumatoid arthritis, unspecified: Secondary | ICD-10-CM | POA: Diagnosis present

## 2019-03-08 DIAGNOSIS — D72829 Elevated white blood cell count, unspecified: Secondary | ICD-10-CM | POA: Diagnosis not present

## 2019-03-08 DIAGNOSIS — Z8781 Personal history of (healed) traumatic fracture: Secondary | ICD-10-CM

## 2019-03-08 DIAGNOSIS — S81831D Puncture wound without foreign body, right lower leg, subsequent encounter: Secondary | ICD-10-CM | POA: Diagnosis not present

## 2019-03-08 DIAGNOSIS — S81801S Unspecified open wound, right lower leg, sequela: Secondary | ICD-10-CM | POA: Diagnosis not present

## 2019-03-08 DIAGNOSIS — Z885 Allergy status to narcotic agent status: Secondary | ICD-10-CM | POA: Diagnosis not present

## 2019-03-08 DIAGNOSIS — Z79899 Other long term (current) drug therapy: Secondary | ICD-10-CM | POA: Diagnosis not present

## 2019-03-08 DIAGNOSIS — S42212A Unspecified displaced fracture of surgical neck of left humerus, initial encounter for closed fracture: Secondary | ICD-10-CM | POA: Diagnosis not present

## 2019-03-08 DIAGNOSIS — Z7982 Long term (current) use of aspirin: Secondary | ICD-10-CM

## 2019-03-08 DIAGNOSIS — S42302A Unspecified fracture of shaft of humerus, left arm, initial encounter for closed fracture: Secondary | ICD-10-CM

## 2019-03-08 DIAGNOSIS — K5909 Other constipation: Secondary | ICD-10-CM | POA: Diagnosis not present

## 2019-03-08 DIAGNOSIS — F32A Depression, unspecified: Secondary | ICD-10-CM | POA: Diagnosis present

## 2019-03-08 DIAGNOSIS — Z741 Need for assistance with personal care: Secondary | ICD-10-CM | POA: Diagnosis not present

## 2019-03-08 DIAGNOSIS — Z7401 Bed confinement status: Secondary | ICD-10-CM | POA: Diagnosis not present

## 2019-03-08 DIAGNOSIS — Z20828 Contact with and (suspected) exposure to other viral communicable diseases: Secondary | ICD-10-CM | POA: Diagnosis present

## 2019-03-08 DIAGNOSIS — N393 Stress incontinence (female) (male): Secondary | ICD-10-CM | POA: Diagnosis present

## 2019-03-08 DIAGNOSIS — S0990XA Unspecified injury of head, initial encounter: Secondary | ICD-10-CM | POA: Diagnosis not present

## 2019-03-08 DIAGNOSIS — I350 Nonrheumatic aortic (valve) stenosis: Secondary | ICD-10-CM | POA: Diagnosis present

## 2019-03-08 DIAGNOSIS — S72012A Unspecified intracapsular fracture of left femur, initial encounter for closed fracture: Secondary | ICD-10-CM | POA: Diagnosis not present

## 2019-03-08 DIAGNOSIS — F329 Major depressive disorder, single episode, unspecified: Secondary | ICD-10-CM | POA: Diagnosis present

## 2019-03-08 DIAGNOSIS — S72002S Fracture of unspecified part of neck of left femur, sequela: Secondary | ICD-10-CM | POA: Diagnosis not present

## 2019-03-08 DIAGNOSIS — M810 Age-related osteoporosis without current pathological fracture: Secondary | ICD-10-CM | POA: Diagnosis present

## 2019-03-08 DIAGNOSIS — R262 Difficulty in walking, not elsewhere classified: Secondary | ICD-10-CM | POA: Diagnosis not present

## 2019-03-08 DIAGNOSIS — E860 Dehydration: Secondary | ICD-10-CM | POA: Diagnosis present

## 2019-03-08 DIAGNOSIS — Z9181 History of falling: Secondary | ICD-10-CM | POA: Diagnosis not present

## 2019-03-08 DIAGNOSIS — M2550 Pain in unspecified joint: Secondary | ICD-10-CM | POA: Diagnosis not present

## 2019-03-08 DIAGNOSIS — W19XXXA Unspecified fall, initial encounter: Secondary | ICD-10-CM

## 2019-03-08 DIAGNOSIS — S42295A Other nondisplaced fracture of upper end of left humerus, initial encounter for closed fracture: Secondary | ICD-10-CM | POA: Diagnosis not present

## 2019-03-08 DIAGNOSIS — R112 Nausea with vomiting, unspecified: Secondary | ICD-10-CM | POA: Diagnosis present

## 2019-03-08 DIAGNOSIS — W1830XA Fall on same level, unspecified, initial encounter: Secondary | ICD-10-CM | POA: Diagnosis present

## 2019-03-08 DIAGNOSIS — M6281 Muscle weakness (generalized): Secondary | ICD-10-CM | POA: Diagnosis not present

## 2019-03-08 DIAGNOSIS — S81801A Unspecified open wound, right lower leg, initial encounter: Secondary | ICD-10-CM | POA: Diagnosis present

## 2019-03-08 DIAGNOSIS — S42302D Unspecified fracture of shaft of humerus, left arm, subsequent encounter for fracture with routine healing: Secondary | ICD-10-CM | POA: Diagnosis not present

## 2019-03-08 DIAGNOSIS — R52 Pain, unspecified: Secondary | ICD-10-CM | POA: Diagnosis not present

## 2019-03-08 DIAGNOSIS — S72002D Fracture of unspecified part of neck of left femur, subsequent encounter for closed fracture with routine healing: Secondary | ICD-10-CM | POA: Diagnosis not present

## 2019-03-08 DIAGNOSIS — S42452A Displaced fracture of lateral condyle of left humerus, initial encounter for closed fracture: Secondary | ICD-10-CM | POA: Diagnosis not present

## 2019-03-08 DIAGNOSIS — Z880 Allergy status to penicillin: Secondary | ICD-10-CM | POA: Diagnosis not present

## 2019-03-08 DIAGNOSIS — M79602 Pain in left arm: Secondary | ICD-10-CM | POA: Diagnosis not present

## 2019-03-08 DIAGNOSIS — Z9889 Other specified postprocedural states: Secondary | ICD-10-CM

## 2019-03-08 HISTORY — DX: Fracture of unspecified part of neck of left femur, initial encounter for closed fracture: S72.002A

## 2019-03-08 HISTORY — DX: Depression, unspecified: F32.A

## 2019-03-08 HISTORY — DX: Unspecified fracture of shaft of humerus, left arm, initial encounter for closed fracture: S42.302A

## 2019-03-08 HISTORY — DX: Essential (primary) hypertension: I10

## 2019-03-08 LAB — APTT: aPTT: 32 seconds (ref 24–36)

## 2019-03-08 LAB — CBC
HCT: 39.7 % (ref 36.0–46.0)
Hemoglobin: 12.8 g/dL (ref 12.0–15.0)
MCH: 30.9 pg (ref 26.0–34.0)
MCHC: 32.2 g/dL (ref 30.0–36.0)
MCV: 95.9 fL (ref 80.0–100.0)
Platelets: 241 10*3/uL (ref 150–400)
RBC: 4.14 MIL/uL (ref 3.87–5.11)
RDW: 13.6 % (ref 11.5–15.5)
WBC: 11.1 10*3/uL — ABNORMAL HIGH (ref 4.0–10.5)
nRBC: 0 % (ref 0.0–0.2)

## 2019-03-08 LAB — SARS CORONAVIRUS 2 BY RT PCR (HOSPITAL ORDER, PERFORMED IN ~~LOC~~ HOSPITAL LAB): SARS Coronavirus 2: NEGATIVE

## 2019-03-08 LAB — TYPE AND SCREEN
ABO/RH(D): O POS
Antibody Screen: NEGATIVE

## 2019-03-08 LAB — PROTIME-INR
INR: 1 (ref 0.8–1.2)
Prothrombin Time: 12.8 seconds (ref 11.4–15.2)

## 2019-03-08 MED ORDER — AMLODIPINE BESYLATE 2.5 MG PO TABS
2.5000 mg | ORAL_TABLET | Freq: Every day | ORAL | Status: DC
  Administered 2019-03-09 – 2019-03-11 (×3): 2.5 mg via ORAL
  Filled 2019-03-08 (×3): qty 1

## 2019-03-08 MED ORDER — GLUCOSAMINE-CHONDROITIN 250-200 MG PO TABS: 1.0000 | ORAL_TABLET | Freq: Every day | ORAL | Status: DC

## 2019-03-08 MED ORDER — HYDRALAZINE HCL 20 MG/ML IJ SOLN: 5.0000 mg | INTRAMUSCULAR | Status: DC | PRN

## 2019-03-08 MED ORDER — ADULT MULTIVITAMIN W/MINERALS CH
1.0000 | ORAL_TABLET | Freq: Every day | ORAL | Status: DC
  Administered 2019-03-10 – 2019-03-11 (×2): 1 via ORAL
  Filled 2019-03-08 (×3): qty 1

## 2019-03-08 MED ORDER — SENNOSIDES-DOCUSATE SODIUM 8.6-50 MG PO TABS: 1.0000 | ORAL_TABLET | Freq: Every evening | ORAL | Status: DC | PRN

## 2019-03-08 MED ORDER — SERTRALINE HCL 50 MG PO TABS
50.0000 mg | ORAL_TABLET | Freq: Every day | ORAL | Status: DC
  Administered 2019-03-09 – 2019-03-12 (×4): 50 mg via ORAL
  Filled 2019-03-08 (×4): qty 1

## 2019-03-08 MED ORDER — METHOCARBAMOL 500 MG PO TABS
500.0000 mg | ORAL_TABLET | Freq: Three times a day (TID) | ORAL | Status: DC | PRN
  Administered 2019-03-10: 15:00:00 500 mg via ORAL

## 2019-03-08 MED ORDER — MORPHINE SULFATE (PF) 2 MG/ML IV SOLN
0.5000 mg | INTRAVENOUS | Status: DC | PRN
  Administered 2019-03-08 – 2019-03-09 (×3): 0.5 mg via INTRAVENOUS
  Filled 2019-03-08 (×4): qty 1

## 2019-03-08 MED ORDER — DOXYCYCLINE HYCLATE 100 MG PO TABS
100.0000 mg | ORAL_TABLET | Freq: Two times a day (BID) | ORAL | Status: DC
  Administered 2019-03-09: 100 mg via ORAL
  Filled 2019-03-08: qty 1

## 2019-03-08 MED ORDER — ACETAMINOPHEN 325 MG PO TABS: 650.0000 mg | ORAL_TABLET | Freq: Four times a day (QID) | ORAL | Status: DC | PRN

## 2019-03-08 MED ORDER — OXYCODONE-ACETAMINOPHEN 5-325 MG PO TABS: 1.0000 | ORAL_TABLET | ORAL | Status: DC | PRN

## 2019-03-08 MED ORDER — ONDANSETRON HCL 4 MG/2ML IJ SOLN
4.0000 mg | Freq: Three times a day (TID) | INTRAMUSCULAR | Status: DC | PRN
  Administered 2019-03-09: 4 mg via INTRAVENOUS
  Filled 2019-03-08: qty 2

## 2019-03-08 NOTE — H&P (Addendum)
History and Physical    Michaela Simpson [id number] DOB: [birth year] DOA: 03/08/2019  Referring MD/NP/PA:   PCP: Celene Squibb, MD   Patient coming from:  The patient is coming from home.  At baseline, pt is independent for most of ADL.        Chief Complaint: fall and pain in left shoulder and left upper leg and hip  HPI: Michaela Simpson is a 85 y.o. female with medical history significant of RA, osteoporosis, stress incontinence, depression, HTN, who presents with fall, and pain in left shoulder and left upper leg and hip.   Pt stats that she accidentally fell when she was in front of her car at about 9:20 AM.  She injured her left shoulder and left upper leg, causing severe pain, which is constant, 7 out of 10 in severity, spasmatic pain, nonradiating.  No arm or leg numbness.  Denies loss of consciousness.  No head or neck injury. Patient does not have chest pain, shortness breath, cough, fever or chills.  She has nausea and vomited a little bit once, currently no nausea vomiting, diarrhea or abdominal pain.  No symptoms of UTI.  Patient states that she has a chronic wound in the right leg since October 2019. Pt was seen initially in [facility]. She was found to have deformity at proximal left femoral neck with angulation of the greater trochanter suspicions for fracture, and impacted transverse sub-capitellar fracture of the left humeral neck. CT head came back negative.  Patient had right hip fracture in 2016-ORIF was performed by Dr. Ninfa Linden at [facility]. Per accepting MD, ortho PA, Hilbert Odor is aware of this pt.   Lab: WBC 11.0, INR 1.0, PTT 22.5, AKI with creatinine 1.22, BUN 17 (creatinine 0.79 on 03/24/2015).  Currently patient blood pressure 148/75, temperature normal.  Patient is admitted to [place] bed as inpatient.  Review of Systems:   General: no fevers, chills, no body weight gain, has fatigue HEENT: no blurry vision, hearing changes or sore throat  Respiratory: no dyspnea, coughing, wheezing CV: no chest pain, no palpitations GI: had nausea, vomiting, no abdominal pain, diarrhea, constipation GU: no dysuria, burning on urination, increased urinary frequency, hematuria  Ext: no leg edema Neuro: no unilateral weakness, numbness, or tingling, no vision change or hearing loss. Has fall.  Skin: has a small wound in the right shin area MSK: has pain in left shoulder and left upper leg and  hip Heme: No easy bruising.  Travel history: No recent long distant travel.  Allergy:  Allergies  Allergen Reactions  . Bee Pollen   . Penicillins Other (See Comments)    Child hood allergy  . Codeine Nausea And Vomiting and Rash    Past Medical History:  Diagnosis Date  . Depression   . HTN (hypertension)   . Osteoporosis   . Rheumatoid arthritis [facility])     Past Surgical History:  Procedure Laterality Date  . INTRAMEDULLARY (IM) NAIL INTERTROCHANTERIC Right 03/14/2015   Procedure: RIGHT INTERTROCHANTRIC INTRAMEDULLARY (IM) NAIL ;  Surgeon: Mcarthur Rossetti, MD;  Location: [place];  Service: Orthopedics;  Laterality: Right;    Social History:  reports that she has never smoked. She does not have any smokeless tobacco history on file. She reports that she does not drink alcohol or use drugs.  Family History:  Family History  Problem Relation Age of Onset  . Cancer Sister      Prior to Admission medications   Medication Sig Start  Date End Date Taking? Authorizing Provider  acetaminophen (TYLENOL) 325 MG tablet Take 2 tablets (650 mg total) by mouth every 6 (six) hours as needed for mild pain (or Fever >/= 101). 03/17/15   Debbe Odea, MD  aspirin EC 325 MG tablet Take 1 tablet (325 mg total) by mouth daily. 03/15/15   Mcarthur Rossetti, MD  Glucosamine-Chondroitin (OSTEO BI-FLEX REGULAR STRENGTH PO) Take 1 tablet by mouth 2 (two) times daily.    [provider]  Multiple Vitamins-Minerals (MULTIVITAMIN WITH MINERALS)  tablet Take 1 tablet by mouth daily.    [provider]  PNEUMOCOCCAL 13-VAL CONJ VACC IM Inject into the muscle.    [provider]  sertraline (ZOLOFT) 50 MG tablet Take 50 mg by mouth daily. 02/18/15   [provider]  temazepam (RESTORIL) 7.5 MG capsule Take 1 capsule (7.5 mg total) by mouth at bedtime as needed for sleep. 03/25/15   Lauree Chandler, NP  traMADol Veatrice Bourbon) 50 MG tablet Take one tablet by mouth every 6 hours as needed for pain 03/25/15   Lauree Chandler, NP    Physical Exam: Vitals:   03/08/19 1730 03/08/19 2148  BP: (!) 148/75 (!) 151/71  Pulse:  67  Resp:  15  Temp: 98.7 F (37.1 C) 98.3 F (36.8 C)  TempSrc: Oral Oral  SpO2: 98% 100%   General: Not in acute distress HEENT:       Eyes: PERRL, EOMI, no scleral icterus.       ENT: No discharge from the ears and nose, no pharynx injection, no tonsillar enlargement.        Neck: No JVD, no bruit, no mass felt. Heme: No neck lymph node enlargement. Cardiac: S1/S2, RRR, No gallops or rubs. Respiratory:  No rales, wheezing, rhonchi or rubs. GI: Soft, nondistended, nontender, no rebound pain, no organomegaly, BS present. GU: No hematuria Ext: No pitting leg edema bilaterally. 2+DP/PT pulse bilaterally. Musculoskeletal: has tenderness in left shoulder and left upper leg and hip Skin: has a wound in right shin area, has some pus-like materials on the surface.    Neuro: Alert, oriented X3, cranial nerves II-XII grossly intact, moves all extremities. Psych: Patient is not psychotic, no suicidal or hemocidal ideation.  Labs on Admission: I have personally reviewed following labs and imaging studies  CBC: No results for input(s): WBC, NEUTROABS, HGB, HCT, MCV, PLT in the last 168 hours. Basic Metabolic Panel: No results for input(s): NA, K, CL, CO2, GLUCOSE, BUN, CREATININE, CALCIUM, MG, PHOS in the last 168 hours. GFR: CrCl cannot be calculated (Patient's most recent lab result is  older than the maximum 21 days allowed.). Liver Function Tests: No results for input(s): AST, ALT, ALKPHOS, BILITOT, PROT, ALBUMIN in the last 168 hours. No results for input(s): LIPASE, AMYLASE in the last 168 hours. No results for input(s): AMMONIA in the last 168 hours. Coagulation Profile: Recent Labs  Lab 03/08/19 2133  INR 1.0   Cardiac Enzymes: No results for input(s): CKTOTAL, CKMB, CKMBINDEX, TROPONINI in the last 168 hours. BNP (last 3 results) No results for input(s): PROBNP in the last 8760 hours. HbA1C: No results for input(s): HGBA1C in the last 72 hours. CBG: No results for input(s): GLUCAP in the last 168 hours. Lipid Profile: No results for input(s): CHOL, HDL, LDLCALC, TRIG, CHOLHDL, LDLDIRECT in the last 72 hours. Thyroid Function Tests: No results for input(s): TSH, T4TOTAL, FREET4, T3FREE, THYROIDAB in the last 72 hours. Anemia Panel: No results for input(s): VITAMINB12, FOLATE,  FERRITIN, TIBC, IRON, RETICCTPCT in the last 72 hours. Urine analysis:    Component Value Date/Time   COLORURINE YELLOW 03/14/2015 1421   APPEARANCEUR HAZY (A) 03/14/2015 1421   LABSPEC 1.010 03/14/2015 1421   PHURINE 7.0 03/14/2015 1421   GLUCOSEU NEGATIVE 03/14/2015 1421   HGBUR NEGATIVE 03/14/2015 1421   BILIRUBINUR NEGATIVE 03/14/2015 1421   KETONESUR 15 (A) 03/14/2015 1421   PROTEINUR NEGATIVE 03/14/2015 1421   UROBILINOGEN 0.2 03/14/2015 1421   NITRITE NEGATIVE 03/14/2015 1421   LEUKOCYTESUR NEGATIVE 03/14/2015 1421   Sepsis Labs: @LABRCNTIP (procalcitonin:4,lacticidven:4) ) Recent Results (from the past 240 hour(s))  SARS Coronavirus 2 [facility] order, Performed in [facility] hospital lab) Nasopharyngeal Nasopharyngeal Swab     Status: None   Collection Time: 03/08/19  9:45 PM   Specimen: Nasopharyngeal Swab  Result Value Ref Range Status   SARS Coronavirus 2 NEGATIVE NEGATIVE Final    Comment: (NOTE) If result is NEGATIVE SARS-CoV-2 target nucleic acids are NOT  DETECTED. The SARS-CoV-2 RNA is generally detectable in upper and lower  respiratory specimens during the acute phase of infection. The lowest  concentration of SARS-CoV-2 viral copies this assay can detect is 250  copies / mL. A negative result does not preclude SARS-CoV-2 infection  and should not be used as the sole basis for treatment or other  patient management decisions.  A negative result may occur with  improper specimen collection / handling, submission of specimen other  than nasopharyngeal swab, presence of viral mutation(s) within the  areas targeted by this assay, and inadequate number of viral copies  (<250 copies / mL). A negative result must be combined with clinical  observations, patient history, and epidemiological information. If result is POSITIVE SARS-CoV-2 target nucleic acids are DETECTED. The SARS-CoV-2 RNA is generally detectable in upper and lower  respiratory specimens dur ing the acute phase of infection.  Positive  results are indicative of active infection with SARS-CoV-2.  Clinical  correlation with patient history and other diagnostic information is  necessary to determine patient infection status.  Positive results do  not rule out bacterial infection or co-infection with other viruses. If result is PRESUMPTIVE POSTIVE SARS-CoV-2 nucleic acids MAY BE PRESENT.   A presumptive positive result was obtained on the submitted specimen  and confirmed on repeat testing.  While 2019 novel coronavirus  (SARS-CoV-2) nucleic acids may be present in the submitted sample  additional confirmatory testing may be necessary for epidemiological  and / or clinical management purposes  to differentiate between  SARS-CoV-2 and other Sarbecovirus currently known to infect humans.  If clinically indicated additional testing with an alternate test  methodology [phone]) is advised. The SARS-CoV-2 RNA is generally  detectable in upper and lower respiratory sp ecimens during  the acute  phase of infection. The expected result is Negative. Fact Sheet for Patients:  StrictlyIdeas.no Fact Sheet for Healthcare Providers: BankingDealers.co.za This test is not yet approved or cleared by the [place] FDA and has been authorized for detection and/or diagnosis of SARS-CoV-2 by FDA under an Emergency Use Authorization (EUA).  This EUA will remain in effect (meaning this test can be used) for the duration of the COVID-19 declaration under Section 564(b)(1) of the Act, 21 U.S.C. section 360bbb-3(b)(1), unless the authorization is terminated or revoked sooner. Performed at [facility], [place] [address] [place], [place] [address]      Radiological Exams on Admission: No results found.   EKG:   Not done in ED, will get  one.   Assessment/Plan Principal Problem:   Closed left humeral fracture Active Problems:   Fall   Depression   HTN (hypertension)   Wound of right leg, initial encounter   Leukocytosis   Fracture of femoral neck, left, closed ([place])   AKI (acute kidney injury) ([place])   Closed left humeral fracture and possible left femoral neck fracture: As evidenced by image. Patient has moderate pain now. No neurovascular compromise. Orthopedic PA, Hilbert Odor was aware for this patient.   - will admit to Med-surg bed as inpt - Pain control: morphine prn and percocet - When necessary Zofran for nausea - Robaxin for muscle spasm - type and cross - INR/PTT - PT/OT when able to (not ordered now) - will get CT-left hip for further evaluation  Fall: seems to have had mechanical fall.  CT head negative. -PT/OT when able to  Leukocytosis: no fever. Likely due to reactive. Since pt has a wound in the right shin area with pus.  -follow up by CBC -will start doxycycline -blood culture  Depression: -zoloft  HTN:  -Continue home medications: amlodipine -IV hydralazine prn  Wound of right leg,  initial encounter:  -Started doxycycline -f/u Blood culture -Wound care consult  AKI: Likely due to dehydration.  Baseline creatinine 0.79 on 03/24/2015.  Her creatinine is 1.22, BUN 17. GFR 42. - IVF: NS 1000/h - Follow up renal function by BMP - Avoid using renal toxic medications, hypotension and contrast dye (or carefully use)  Perioperative Cardiac Risk: He does not have significant cardiac history, no history of CAD or heart attack. Currently patient is active and independent of his ADLs, IADLs. No recent acute cardiac issues.  Patient does not have chest pain, shortness of breath, palpitation, leg edema.  No signs of acute CHF currently.  EKG has no acute change. At this time point, no further work up is needed. His GUPTA score perioperative myocardial infarction or cardaic arrest is 0.23 % which is low rick.  Inpatient status:  # Patient requires inpatient status due to high intensity of service, high risk for further deterioration and high frequency of surveillance required.  I certify that at the point of admission it is my clinical judgment that the patient will require inpatient hospital care spanning beyond 2 midnights from the point of admission.  . This patient has multiple chronic comorbidities including RA, osteoporosis, stress incontinence, depression. . Now patient has presenting with fall and nondisplaced left sub capital humeral fracture and possible left femoral neck fracture . The worrisome physical exam findings include tenderness in left shoulder and left upper leg . The initial radiographic and laboratory data are worrisome because of nondisplaced left sub capital humeral fracture . Current medical needs: please see my assessment and plan . Predictability of an adverse outcome (risk): Patient has multiple comorbidities, now presents with left shoulder fracture and possible left femoral neck Fx..  Patient will need surgery.  Given her old age, patient is at high risk of  developing complications.  Patient will need to be treated in the hospital for at least 2 days.          DVT ppx: SCd Code Status: Partial code (I discussed with the patient, and explained the meaning of CODE STATUS, patient wants to be partial code, OK for CPR, but no intubation). Family Communication: None at bed side.     Disposition Plan:  To be determined Consults called: Orthopedic PA, Hilbert Odor is aware for this patient per accepting MDs  note Admission status:   medical floor/inpt  Date of Service 03/08/2019    Ivor Costa [facility]   If 7PM-7AM, please contact night-coverage [email or web address] Password [email or web address] 03/08/2019, 11:00 PM

## 2019-03-09 ENCOUNTER — Inpatient Hospital Stay (HOSPITAL_COMMUNITY): Payer: Medicare Other | Admitting: Anesthesiology

## 2019-03-09 ENCOUNTER — Inpatient Hospital Stay (HOSPITAL_COMMUNITY): Payer: Medicare Other

## 2019-03-09 ENCOUNTER — Encounter (HOSPITAL_COMMUNITY)
Admission: AD | Disposition: A | Discharge status: Skilled Nursing Facility | Payer: Self-pay | Source: Other Acute Inpatient Hospital | Attending: Internal Medicine

## 2019-03-09 ENCOUNTER — Other Ambulatory Visit: Payer: Self-pay

## 2019-03-09 ENCOUNTER — Encounter (HOSPITAL_COMMUNITY): Payer: Self-pay

## 2019-03-09 DIAGNOSIS — F320 Major depressive disorder, single episode, mild: Secondary | ICD-10-CM

## 2019-03-09 DIAGNOSIS — S72002A Fracture of unspecified part of neck of left femur, initial encounter for closed fracture: Secondary | ICD-10-CM

## 2019-03-09 DIAGNOSIS — N179 Acute kidney failure, unspecified: Secondary | ICD-10-CM

## 2019-03-09 DIAGNOSIS — S42295A Other nondisplaced fracture of upper end of left humerus, initial encounter for closed fracture: Secondary | ICD-10-CM

## 2019-03-09 DIAGNOSIS — R011 Cardiac murmur, unspecified: Secondary | ICD-10-CM

## 2019-03-09 DIAGNOSIS — I10 Essential (primary) hypertension: Secondary | ICD-10-CM

## 2019-03-09 HISTORY — PX: HIP PINNING,CANNULATED: SHX1758

## 2019-03-09 LAB — ECHOCARDIOGRAM COMPLETE
Height: 62 in
Weight: 1813.06 oz

## 2019-03-09 LAB — SURGICAL PCR SCREEN
MRSA, PCR: NEGATIVE
Staphylococcus aureus: NEGATIVE

## 2019-03-09 LAB — BASIC METABOLIC PANEL
Anion gap: 12 (ref 5–15)
BUN: 20 mg/dL (ref 8–23)
CO2: 21 mmol/L — ABNORMAL LOW (ref 22–32)
Calcium: 9.4 mg/dL (ref 8.9–10.3)
Chloride: 108 mmol/L (ref 98–111)
Creatinine, Ser: 1.25 mg/dL — ABNORMAL HIGH (ref 0.44–1.00)
GFR calc Af Amer: 45 mL/min — ABNORMAL LOW (ref >60)
GFR calc non Af Amer: 39 mL/min — ABNORMAL LOW (ref >60)
Glucose, Bld: 106 mg/dL — ABNORMAL HIGH (ref 70–99)
Potassium: 4.4 mmol/L (ref 3.5–5.1)
Sodium: 141 mmol/L (ref 135–145)

## 2019-03-09 SURGERY — FIXATION, FEMUR, NECK, PERCUTANEOUS, USING SCREW
Anesthesia: General | Site: Hip | Laterality: Left

## 2019-03-09 MED ORDER — MUPIROCIN 2 % EX OINT: 1.0000 "application " | TOPICAL_OINTMENT | Freq: Two times a day (BID) | CUTANEOUS | Status: DC

## 2019-03-09 MED ORDER — ROCURONIUM BROMIDE 10 MG/ML (PF) SYRINGE
PREFILLED_SYRINGE | INTRAVENOUS | Status: AC
  Filled 2019-03-09: qty 20

## 2019-03-09 MED ORDER — DEXAMETHASONE SODIUM PHOSPHATE 10 MG/ML IJ SOLN
INTRAMUSCULAR | Status: AC
  Filled 2019-03-09: qty 1

## 2019-03-09 MED ORDER — ETOMIDATE 2 MG/ML IV SOLN
INTRAVENOUS | Status: DC | PRN
  Administered 2019-03-09: 11 mg via INTRAVENOUS

## 2019-03-09 MED ORDER — FENTANYL CITRATE (PF) 250 MCG/5ML IJ SOLN
INTRAMUSCULAR | Status: AC
  Filled 2019-03-09: qty 5

## 2019-03-09 MED ORDER — INFLUENZA VAC A&B SA ADJ QUAD 0.5 ML IM PRSY
0.5000 mL | PREFILLED_SYRINGE | INTRAMUSCULAR | Status: AC
  Administered 2019-03-12: 0.5 mL via INTRAMUSCULAR
  Filled 2019-03-09: qty 0.5

## 2019-03-09 MED ORDER — JUVEN PO PACK
1.0000 | PACK | Freq: Two times a day (BID) | ORAL | Status: DC
  Administered 2019-03-10 – 2019-03-12 (×4): 1 via ORAL
  Filled 2019-03-09 (×6): qty 1

## 2019-03-09 MED ORDER — ONDANSETRON HCL 4 MG/2ML IJ SOLN: 4.0000 mg | Freq: Once | INTRAMUSCULAR | Status: DC | PRN

## 2019-03-09 MED ORDER — ACETAMINOPHEN 325 MG PO TABS
325.0000 mg | ORAL_TABLET | Freq: Four times a day (QID) | ORAL | Status: DC | PRN
  Administered 2019-03-11: 650 mg via ORAL
  Filled 2019-03-09 (×2): qty 2

## 2019-03-09 MED ORDER — PANTOPRAZOLE SODIUM 40 MG PO TBEC
40.0000 mg | DELAYED_RELEASE_TABLET | Freq: Every day | ORAL | Status: DC
  Administered 2019-03-09 – 2019-03-12 (×4): 40 mg via ORAL
  Filled 2019-03-09 (×4): qty 1

## 2019-03-09 MED ORDER — VANCOMYCIN HCL IN DEXTROSE 1-5 GM/200ML-% IV SOLN
1000.0000 mg | Freq: Two times a day (BID) | INTRAVENOUS | Status: AC
  Administered 2019-03-10: 1000 mg via INTRAVENOUS
  Filled 2019-03-09: qty 200

## 2019-03-09 MED ORDER — PHENOL 1.4 % MT LIQD: 1.0000 | OROMUCOSAL | Status: DC | PRN

## 2019-03-09 MED ORDER — PHENYLEPHRINE 40 MCG/ML (10ML) SYRINGE FOR IV PUSH (FOR BLOOD PRESSURE SUPPORT)
PREFILLED_SYRINGE | INTRAVENOUS | Status: AC
  Filled 2019-03-09: qty 10

## 2019-03-09 MED ORDER — ASPIRIN EC 325 MG PO TBEC
325.0000 mg | DELAYED_RELEASE_TABLET | Freq: Every day | ORAL | Status: DC
  Administered 2019-03-10 – 2019-03-12 (×3): 325 mg via ORAL
  Filled 2019-03-09 (×3): qty 1

## 2019-03-09 MED ORDER — SODIUM CHLORIDE 0.9 % IV SOLN
INTRAVENOUS | Status: DC | PRN
  Administered 2019-03-09: 17:00:00 25 ug/min via INTRAVENOUS

## 2019-03-09 MED ORDER — PROPOFOL 10 MG/ML IV BOLUS
INTRAVENOUS | Status: AC
  Filled 2019-03-09: qty 20

## 2019-03-09 MED ORDER — EPHEDRINE SULFATE 50 MG/ML IJ SOLN
INTRAMUSCULAR | Status: DC | PRN
  Administered 2019-03-09: 10 mg via INTRAVENOUS

## 2019-03-09 MED ORDER — ONDANSETRON HCL 4 MG/2ML IJ SOLN: 4.0000 mg | Freq: Four times a day (QID) | INTRAMUSCULAR | Status: DC | PRN

## 2019-03-09 MED ORDER — LACTATED RINGERS IV SOLN
INTRAVENOUS | Status: DC | PRN
  Administered 2019-03-09: 17:00:00 via INTRAVENOUS

## 2019-03-09 MED ORDER — SUCCINYLCHOLINE CHLORIDE 200 MG/10ML IV SOSY
PREFILLED_SYRINGE | INTRAVENOUS | Status: DC | PRN
  Administered 2019-03-09: 100 mg via INTRAVENOUS

## 2019-03-09 MED ORDER — LIDOCAINE 2% (20 MG/ML) 5 ML SYRINGE
INTRAMUSCULAR | Status: AC
  Filled 2019-03-09: qty 15

## 2019-03-09 MED ORDER — ROCURONIUM BROMIDE 10 MG/ML (PF) SYRINGE
PREFILLED_SYRINGE | INTRAVENOUS | Status: DC | PRN
  Administered 2019-03-09: 10 mg via INTRAVENOUS
  Administered 2019-03-09: 30 mg via INTRAVENOUS

## 2019-03-09 MED ORDER — FENTANYL CITRATE (PF) 100 MCG/2ML IJ SOLN: 50.0000 ug | Freq: Once | INTRAMUSCULAR | Status: DC

## 2019-03-09 MED ORDER — METOCLOPRAMIDE HCL 5 MG PO TABS: 5.0000 mg | ORAL_TABLET | Freq: Three times a day (TID) | ORAL | Status: DC | PRN

## 2019-03-09 MED ORDER — DEXAMETHASONE SODIUM PHOSPHATE 10 MG/ML IJ SOLN
INTRAMUSCULAR | Status: DC | PRN
  Administered 2019-03-09: 4 mg via INTRAVENOUS

## 2019-03-09 MED ORDER — LIDOCAINE 2% (20 MG/ML) 5 ML SYRINGE
INTRAMUSCULAR | Status: DC | PRN
  Administered 2019-03-09: 60 mg via INTRAVENOUS

## 2019-03-09 MED ORDER — DOCUSATE SODIUM 100 MG PO CAPS
100.0000 mg | ORAL_CAPSULE | Freq: Two times a day (BID) | ORAL | Status: DC
  Administered 2019-03-09 – 2019-03-12 (×5): 100 mg via ORAL
  Filled 2019-03-09 (×5): qty 1

## 2019-03-09 MED ORDER — METHOCARBAMOL 1000 MG/10ML IJ SOLN
500.0000 mg | Freq: Four times a day (QID) | INTRAVENOUS | Status: DC | PRN
  Filled 2019-03-09: qty 5

## 2019-03-09 MED ORDER — MENTHOL 3 MG MT LOZG: 1.0000 | LOZENGE | OROMUCOSAL | Status: DC | PRN

## 2019-03-09 MED ORDER — ALUM & MAG HYDROXIDE-SIMETH 200-200-20 MG/5ML PO SUSP: 30.0000 mL | ORAL | Status: DC | PRN

## 2019-03-09 MED ORDER — ONDANSETRON HCL 4 MG/2ML IJ SOLN
INTRAMUSCULAR | Status: DC | PRN
  Administered 2019-03-09: 4 mg via INTRAVENOUS

## 2019-03-09 MED ORDER — METHOCARBAMOL 500 MG PO TABS
500.0000 mg | ORAL_TABLET | Freq: Four times a day (QID) | ORAL | Status: DC | PRN
  Administered 2019-03-10 – 2019-03-11 (×3): 500 mg via ORAL
  Filled 2019-03-09 (×4): qty 1

## 2019-03-09 MED ORDER — SODIUM CHLORIDE 0.9 % IV SOLN
INTRAVENOUS | Status: DC
  Administered 2019-03-09: 14:00:00 via INTRAVENOUS
  Administered 2019-03-09: 75 mL/h via INTRAVENOUS

## 2019-03-09 MED ORDER — FENTANYL CITRATE (PF) 250 MCG/5ML IJ SOLN
INTRAMUSCULAR | Status: DC | PRN
  Administered 2019-03-09: 50 ug via INTRAVENOUS
  Administered 2019-03-09 (×3): 25 ug via INTRAVENOUS

## 2019-03-09 MED ORDER — HYDROCODONE-ACETAMINOPHEN 7.5-325 MG PO TABS: 1.0000 | ORAL_TABLET | ORAL | Status: DC | PRN

## 2019-03-09 MED ORDER — FENTANYL CITRATE (PF) 100 MCG/2ML IJ SOLN
INTRAMUSCULAR | Status: AC
  Administered 2019-03-09: 100 ug
  Filled 2019-03-09: qty 2

## 2019-03-09 MED ORDER — FENTANYL CITRATE (PF) 100 MCG/2ML IJ SOLN: 25.0000 ug | INTRAMUSCULAR | Status: DC | PRN

## 2019-03-09 MED ORDER — PROMETHAZINE HCL 25 MG/ML IJ SOLN
12.5000 mg | Freq: Four times a day (QID) | INTRAMUSCULAR | Status: DC | PRN
  Filled 2019-03-09: qty 1

## 2019-03-09 MED ORDER — ONDANSETRON HCL 4 MG PO TABS: 4.0000 mg | ORAL_TABLET | Freq: Four times a day (QID) | ORAL | Status: DC | PRN

## 2019-03-09 MED ORDER — METOCLOPRAMIDE HCL 5 MG/ML IJ SOLN: 5.0000 mg | Freq: Three times a day (TID) | INTRAMUSCULAR | Status: DC | PRN

## 2019-03-09 MED ORDER — HYDROCODONE-ACETAMINOPHEN 5-325 MG PO TABS
1.0000 | ORAL_TABLET | ORAL | Status: DC | PRN
  Administered 2019-03-10 – 2019-03-12 (×6): 1 via ORAL
  Filled 2019-03-09 (×5): qty 1

## 2019-03-09 MED ORDER — 0.9 % SODIUM CHLORIDE (POUR BTL) OPTIME
TOPICAL | Status: DC | PRN
  Administered 2019-03-09: 1000 mL

## 2019-03-09 MED ORDER — HYDROMORPHONE HCL 1 MG/ML IJ SOLN: 0.5000 mg | INTRAMUSCULAR | Status: DC | PRN

## 2019-03-09 MED ORDER — VANCOMYCIN HCL 1000 MG IV SOLR
INTRAVENOUS | Status: DC | PRN
  Administered 2019-03-09: 1000 mg via INTRAVENOUS

## 2019-03-09 MED ORDER — MORPHINE SULFATE (PF) 2 MG/ML IV SOLN
0.5000 mg | INTRAVENOUS | Status: DC | PRN
  Administered 2019-03-11: 0.5 mg via INTRAVENOUS
  Filled 2019-03-09: qty 1

## 2019-03-09 MED ORDER — ONDANSETRON HCL 4 MG/2ML IJ SOLN
INTRAMUSCULAR | Status: AC
  Filled 2019-03-09: qty 2

## 2019-03-09 SURGICAL SUPPLY — 39 items
BIT DRILL 4.8X300 (BIT) ×2 IMPLANT
BIT DRILL 4.8X300MM (BIT) ×1
COVER PERINEAL POST (MISCELLANEOUS) ×3 IMPLANT
COVER SURGICAL LIGHT HANDLE (MISCELLANEOUS) ×3 IMPLANT
COVER WAND RF STERILE (DRAPES) ×3 IMPLANT
DRAPE STERI IOBAN 125X83 (DRAPES) ×3 IMPLANT
DRSG AQUACEL AG ADV 3.5X 4 (GAUZE/BANDAGES/DRESSINGS) ×3 IMPLANT
DRSG MEPILEX BORDER 4X4 (GAUZE/BANDAGES/DRESSINGS) IMPLANT
DURAPREP 26ML APPLICATOR (WOUND CARE) ×3 IMPLANT
ELECT REM PT RETURN 9FT ADLT (ELECTROSURGICAL) ×3
ELECTRODE REM PT RTRN 9FT ADLT (ELECTROSURGICAL) ×1 IMPLANT
GAUZE XEROFORM 1X8 LF (GAUZE/BANDAGES/DRESSINGS) ×3 IMPLANT
GLOVE BIO SURGEON STRL SZ8 (GLOVE) ×3 IMPLANT
GLOVE BIOGEL PI IND STRL 7.5 (GLOVE) ×1 IMPLANT
GLOVE BIOGEL PI IND STRL 8 (GLOVE) ×1 IMPLANT
GLOVE BIOGEL PI INDICATOR 7.5 (GLOVE) ×2
GLOVE BIOGEL PI INDICATOR 8 (GLOVE) ×2
GLOVE ORTHO TXT STRL SZ7.5 (GLOVE) ×3 IMPLANT
GOWN STRL REUS W/ TWL LRG LVL3 (GOWN DISPOSABLE) ×2 IMPLANT
GOWN STRL REUS W/ TWL XL LVL3 (GOWN DISPOSABLE) ×2 IMPLANT
GOWN STRL REUS W/TWL LRG LVL3 (GOWN DISPOSABLE) ×4
GOWN STRL REUS W/TWL XL LVL3 (GOWN DISPOSABLE) ×4
KIT TURNOVER KIT B (KITS) ×3 IMPLANT
MANIFOLD NEPTUNE II (INSTRUMENTS) ×3 IMPLANT
NS IRRIG 1000ML POUR BTL (IV SOLUTION) ×3 IMPLANT
PACK GENERAL/GYN (CUSTOM PROCEDURE TRAY) ×3 IMPLANT
PAD ARMBOARD 7.5X6 YLW CONV (MISCELLANEOUS) ×6 IMPLANT
PIN GUIDE DRILL TIP 2.8X300 (DRILL) ×9 IMPLANT
SCREW 8.0X80MMX16 (Screw) ×3 IMPLANT
SCREW CANNULATED 8.0X75MM (Screw) ×3 IMPLANT
SCREW PARTIAL THREAD 8.0X90MM (Screw) ×3 IMPLANT
STAPLER VISISTAT 35W (STAPLE) ×3 IMPLANT
SUT VIC AB 0 CT1 27 (SUTURE) ×2
SUT VIC AB 0 CT1 27XBRD ANBCTR (SUTURE) ×1 IMPLANT
SUT VIC AB 2-0 CT1 27 (SUTURE) ×2
SUT VIC AB 2-0 CT1 TAPERPNT 27 (SUTURE) ×1 IMPLANT
TOWEL GREEN STERILE (TOWEL DISPOSABLE) ×3 IMPLANT
TOWEL GREEN STERILE FF (TOWEL DISPOSABLE) ×3 IMPLANT
WATER STERILE IRR 1000ML POUR (IV SOLUTION) ×3 IMPLANT

## 2019-03-09 NOTE — Transfer of Care (Signed)
Immediate Anesthesia Transfer of Care Note  Patient: Michaela Simpson  Procedure(s) Performed: CANNULATED HIP PINNING (Left Hip)  Patient Location: PACU  Anesthesia Type:General  Level of Consciousness: awake, drowsy and patient cooperative  Airway & Oxygen Therapy: Patient Spontanous Breathing and Patient connected to face mask oxygen  Post-op Assessment: Report given to RN and Post -op Vital signs reviewed and stable  Post vital signs: Reviewed and stable  Last Vitals:  Vitals Value Taken Time  BP 90/58 03/09/19 1816  Temp    Pulse 77 03/09/19 1819  Resp 10 03/09/19 1819  SpO2 100 % 03/09/19 1819  Vitals shown include unvalidated device data.  Last Pain:  Vitals:   03/09/19 1300  TempSrc: Axillary  PainSc:          Complications: No apparent anesthesia complications

## 2019-03-09 NOTE — Anesthesia Procedure Notes (Signed)
Arterial Line Insertion Start/End9/18/2020 4:30 PM, 03/09/2019 4:40 PM Performed by: CRNA  Patient location: Pre-op. Right, radial was placed Catheter size: 20 G Hand hygiene performed , maximum sterile barriers used  and Seldinger technique used Allen's test indicative of satisfactory collateral circulation Attempts: 2 Procedure performed using ultrasound guided technique. Ultrasound Notes:anatomy identified, needle tip was noted to be adjacent to the nerve/plexus identified and no ultrasound evidence of intravascular and/or intraneural injection Following insertion, dressing applied and Biopatch. Post procedure assessment: normal  Patient tolerated the procedure well with no immediate complications.

## 2019-03-09 NOTE — Anesthesia Procedure Notes (Signed)
Procedure Name: Intubation Date/Time: 03/09/2019 4:50 PM Performed by: Renato Shin, CRNA Pre-anesthesia Checklist: Patient identified, Emergency Drugs available, Suction available and Patient being monitored Patient Re-evaluated:Patient Re-evaluated prior to induction Oxygen Delivery Method: Circle system utilized Preoxygenation: Pre-oxygenation with 100% oxygen Induction Type: IV induction Ventilation: Mask ventilation without difficulty Laryngoscope Size: Miller and 2 Grade View: Grade I Tube type: Oral Tube size: 7.0 mm Number of attempts: 1 Airway Equipment and Method: Stylet and Oral airway Placement Confirmation: ETT inserted through vocal cords under direct vision,  positive ETCO2 and breath sounds checked- equal and bilateral Secured at: 21 cm Tube secured with: Tape Dental Injury: Teeth and Oropharynx as per pre-operative assessment

## 2019-03-09 NOTE — Progress Notes (Signed)
Patient had not voided during shift.  In and out cath performed.  600cc of amber urine drained from bladder.  Patient tolerated well.  Replaced purewick catheter.

## 2019-03-09 NOTE — Progress Notes (Signed)
Patient ID: Michaela Simpson, female   DOB: [birth year], 85 y.o.   MRN: [id number] I have seen and examined the patient.  I agree with Orion Crook, PA-C's consultation note with an assessment and plan as it relates to the patient's injuries.  Of also spoken with her family.  I have talked to the patient and family at length about her injuries.  Today we will plan to proceed to surgery for cannulated screw fixation of the left hip femoral neck fracture.  However, intraoperatively if we felt it would be better to proceed with a partial hip replacement to direct inter-approach we will do so.  I have conveyed this to the family as well.  We had a long thorough discussion about the risk and benefits of surgery as well.  We will attempt to treat the proximal humerus fracture nonoperative.  All questions and concerns have been answered and addressed.  Informed consent is obtained.

## 2019-03-09 NOTE — Op Note (Signed)
NAMENANITA, KEDROWSKI MEDICAL RECORD [id number] ACCOUNT [id number] DATE OF BIRTH:[birth year] FACILITY: [facility] LOCATION: [facility] PHYSICIAN:Thom Ollinger Kerry Fort, MD  OPERATIVE REPORT  DATE OF PROCEDURE:  03/09/2019  PREOPERATIVE DIAGNOSIS:  Nondisplaced left hip femoral neck fracture.  POSTOPERATIVE DIAGNOSIS:  Nondisplaced left hip femoral neck fracture.  PROCEDURE:  Cannulated screw placement, left hip.  IMPLANTS:  Biomet-Zimmer 8.0 cannulated screws x3.  SURGEON:  Lind Guest. Ninfa Linden, MD  ANESTHESIA:  General.  ANTIBIOTICS:  One gram IV vancomycin.  ESTIMATED BLOOD LOSS:  100 mL.  COMPLICATIONS:  None.  INDICATIONS:  The patient is a very pleasant 85 year old female who sustained a mechanical fall while doing yard work a day ago.  She was seen in an outlying hospital and found to have a complex proximal humerus fracture on the left shoulder and a  nondisplaced left hip femoral neck fracture.  She was transported to [facility] as a transfer appropriately due to medical issues.  She now presents for fixation of her left hip.  An echocardiogram was obtained and showed severe aortic stenosis.  The  anesthesiologist was concerned about her even being able to sit up for surgery for the shoulder.  We are electing to fix the hip today because that is more prudent to fix.  I talked to her and her family at length about the treatment options.  From a  surgical standpoint, cannulated screw fixation is one option versus a hemiarthroplasty.  I did feel that given the fact that she could actually somewhat perform a straight leg raise on her own that it would be worth trying cannulated screw fixation based  off of the plain films and the CT scan.  Obviously, if this fails, we would have to convert to a partial hip arthroplasty, and I explained this in detail with the family about the risks of this treatment option in terms of the potential for nonunion and  implant  failure.  DESCRIPTION OF PROCEDURE:  After informed consent was obtained and appropriate left hip was marked, she was brought to the operating room.  Anesthesia obtained an arterial line in the holding room.  General anesthesia was obtained while she was on her  stretcher.  We then placed her supine on the Hana fracture table with both feet in in-line skeletal traction boots with no traction applied.  I was then able to assess her left hip under direct fluoroscopy, putting it through internal and external  rotation, and it did move as a unit from the impacted fracture.  I did feel that this was a stable fracture pattern to attempt cannulated screw fixation.  The left hip was prepped and draped with DuraPrep and sterile drapes.  A time-out was called, and  she was identified as correct patient and correct left hip.  I then made a lateral incision at the proximal femur.  I placed 3 temporary guide pins, traversing lateral cortex of the femur into the femoral neck, past the fracture into the femoral head.   This was an inverted triangle format.  I then took measurements off of these and placed 3 cannulated screws.  These were all 8.0 mm Biomet-Zimmer cannulated screws.  Once I had put the screws in place and removed the guide pins, I put the hip through  internal and external rotation under fluoroscopy in AP and lateral planes.  It did move as a unit, and I felt that all screws were in the femoral head.  We then irrigated the soft tissue with normal saline  solution. I closed the deep tissue with 0 Vicryl  followed by 2-0 Vicryl in the subcutaneous tissue, and interrupted staples on the skin.  Xeroform and an Aquacel dressing were applied.  She was taken off the Hana table, awakened, extubated, and taken to recovery room.  All final counts were correct.   There were no complications noted.  LN/NUANCE  D:03/09/2019 T:03/09/2019 JOB:[id number]

## 2019-03-09 NOTE — Consult Note (Signed)
Reason for Consult:Left humerus and hip fxs Referring Physician: S Ghimire  Michaela Simpson is an 85 y.o. female.  HPI: Michaela Simpson was going to an appointment at the wound care center in [place] when she missed her footing and fell in front of her car. She had immediate left shoulder and hip pain and could not get up. She was taken to the ED at [facility] where x-rays showed left humerus and hip fxs. She requested transfer to [facility] and was accepted for Dr. Ninfa Linden who fixed the contralateral hip 4y ago. She c/o localized pain to the shoulder and hip. She has a chronic right calf ulcer.  Past Medical History:  Diagnosis Date  . Depression   . HTN (hypertension)   . Osteoporosis   . Rheumatoid arthritis [name])     Past Surgical History:  Procedure Laterality Date  . INTRAMEDULLARY (IM) NAIL INTERTROCHANTERIC Right 03/14/2015   Procedure: RIGHT INTERTROCHANTRIC INTRAMEDULLARY (IM) NAIL ;  Surgeon: Mcarthur Rossetti, MD;  Location: [place];  Service: Orthopedics;  Laterality: Right;    Family History  Problem Relation Age of Onset  . Cancer Sister     Social History:  reports that she has never smoked. She has never used smokeless tobacco. She reports that she does not drink alcohol or use drugs.  Allergies:  Allergies  Allergen Reactions  . Bee Pollen   . Penicillins Other (See Comments)    Child hood allergy  . Codeine Nausea And Vomiting and Rash    Medications: I have reviewed the patient's current medications.  Results for orders placed or performed during the hospital encounter of 03/08/19 (from the past 48 hour(s))  Protime-INR     Status: None   Collection Time: 03/08/19  9:33 PM  Result Value Ref Range   Prothrombin Time 12.8 11.4 - 15.2 seconds   INR 1.0 0.8 - 1.2    Comment: (NOTE) INR goal varies based on device and disease states. Performed at [facility], [place] [address], [place], [place] [address]   APTT     Status: None   Collection Time:  03/08/19  9:33 PM  Result Value Ref Range   aPTT 32 24 - 36 seconds    Comment: Performed at [facility] [address] [place], [place] [address]  Type and screen [place]     Status: None   Collection Time: 03/08/19  9:33 PM  Result Value Ref Range   ABO/RH(D) O POS    Antibody Screen NEG    Sample Expiration      03/11/2019,2359 Performed at [facility], [place] [address] [place], [place] [address]   SARS Coronavirus 2 [facility] order, Performed in [facility] hospital lab) Nasopharyngeal Nasopharyngeal Swab     Status: None   Collection Time: 03/08/19  9:45 PM   Specimen: Nasopharyngeal Swab  Result Value Ref Range   SARS Coronavirus 2 NEGATIVE NEGATIVE    Comment: (NOTE) If result is NEGATIVE SARS-CoV-2 target nucleic acids are NOT DETECTED. The SARS-CoV-2 RNA is generally detectable in upper and lower  respiratory specimens during the acute phase of infection. The lowest  concentration of SARS-CoV-2 viral copies this assay can detect is 250  copies / mL. A negative result does not preclude SARS-CoV-2 infection  and should not be used as the sole basis for treatment or other  patient management decisions.  A negative result may occur with  improper specimen collection / handling, submission  of specimen other  than nasopharyngeal swab, presence of viral mutation(s) within the  areas targeted by this assay, and inadequate number of viral copies  (<250 copies / mL). A negative result must be combined with clinical  observations, patient history, and epidemiological information. If result is POSITIVE SARS-CoV-2 target nucleic acids are DETECTED. The SARS-CoV-2 RNA is generally detectable in upper and lower  respiratory specimens dur ing the acute phase of infection.  Positive  results are indicative of active infection with SARS-CoV-2.  Clinical  correlation with patient history and other diagnostic information is  necessary to determine  patient infection status.  Positive results do  not rule out bacterial infection or co-infection with other viruses. If result is PRESUMPTIVE POSTIVE SARS-CoV-2 nucleic acids MAY BE PRESENT.   A presumptive positive result was obtained on the submitted specimen  and confirmed on repeat testing.  While 2019 novel coronavirus  (SARS-CoV-2) nucleic acids may be present in the submitted sample  additional confirmatory testing may be necessary for epidemiological  and / or clinical management purposes  to differentiate between  SARS-CoV-2 and other Sarbecovirus currently known to infect humans.  If clinically indicated additional testing with an alternate test  methodology [phone]) is advised. The SARS-CoV-2 RNA is generally  detectable in upper and lower respiratory sp ecimens during the acute  phase of infection. The expected result is Negative. Fact Sheet for Patients:  StrictlyIdeas.no Fact Sheet for Healthcare Providers: BankingDealers.co.za This test is not yet approved or cleared by the [place] FDA and has been authorized for detection and/or diagnosis of SARS-CoV-2 by FDA under an Emergency Use Authorization (EUA).  This EUA will remain in effect (meaning this test can be used) for the duration of the COVID-19 declaration under Section 564(b)(1) of the Act, 21 U.S.C. section 360bbb-3(b)(1), unless the authorization is terminated or revoked sooner. Performed at [facility], [place] [address], [place], [place] [address]   CBC     Status: Abnormal   Collection Time: 03/08/19 11:27 PM  Result Value Ref Range   WBC 11.1 (H) 4.0 - 10.5 K/uL   RBC 4.14 3.87 - 5.11 MIL/uL   Hemoglobin 12.8 12.0 - 15.0 g/dL   HCT 39.7 36.0 - 46.0 %   MCV 95.9 80.0 - 100.0 fL   MCH 30.9 26.0 - 34.0 pg   MCHC 32.2 30.0 - 36.0 g/dL   RDW 13.6 11.5 - 15.5 %   Platelets 241 150 - 400 K/uL   nRBC 0.0 0.0 - 0.2 %    Comment: Performed at [facility], [place] [address], [place], [place] [id number]  Basic metabolic panel     Status: Abnormal   Collection Time: 03/08/19 11:27 PM  Result Value Ref Range   Sodium 141 135 - 145 mmol/L   Potassium 4.4 3.5 - 5.1 mmol/L   Chloride 108 98 - 111 mmol/L   CO2 21 (L) 22 - 32 mmol/L   Glucose, Bld 106 (H) 70 - 99 mg/dL   BUN 20 8 - 23 mg/dL   Creatinine, Ser 1.25 (H) 0.44 - 1.00 mg/dL   Calcium 9.4 8.9 - 10.3 mg/dL   GFR calc non Af Amer 39 (L) >60 mL/min   GFR calc Af Amer 45 (L) >60 mL/min   Anion gap 12 5 - 15    Comment: Performed at [facility] [address] [place], [place] [address]    No results found.  Review of Systems  Constitutional:  Negative for weight loss.  HENT: Negative for ear discharge, ear pain, hearing loss and tinnitus.   Eyes: Negative for blurred vision, double vision, photophobia and pain.  Respiratory: Negative for cough, sputum production and shortness of breath.   Cardiovascular: Negative for chest pain.  Gastrointestinal: Negative for abdominal pain, nausea and vomiting.  Genitourinary: Negative for dysuria, flank pain, frequency and urgency.  Musculoskeletal: Positive for joint pain (Left shoulder, left hip). Negative for back pain, falls, myalgias and neck pain.  Neurological: Negative for dizziness, tingling, sensory change, focal weakness, loss of consciousness and headaches.  Endo/Heme/Allergies: Does not bruise/bleed easily.  Psychiatric/Behavioral: Negative for depression, memory loss and substance abuse. The patient is not nervous/anxious.    Blood pressure 139/65, pulse 75, temperature 98.5 F (36.9 C), resp. rate 14, height 5\' 2"  (1.575 m), weight 51.4 kg, SpO2 98 %. Physical Exam  Constitutional: She appears well-developed and well-nourished. No distress.  HENT:  Head: Normocephalic and atraumatic.  Eyes: Conjunctivae are normal. Right eye exhibits no discharge. Left eye exhibits no discharge. No scleral icterus.  Neck: Normal range  of motion.  Cardiovascular: Normal rate and regular rhythm.  Respiratory: Effort normal. No respiratory distress.  Musculoskeletal:     Comments: Left shoulder, elbow, wrist, digits- no skin wounds, severe TTP, in sling  Sens  Ax/R/M/U intact  Mot   Ax/ R/ PIN/ M/ AIN/ U intact  Rad 2+  RLE No traumatic wounds, ecchymosis, or rash  Ulceration right calf  No knee or ankle effusion  Knee stable to varus/ valgus and anterior/posterior stress  Sens DPN, SPN, TN intact  Motor EHL, ext, flex, evers 5/5  DP 2+, PT 1+, No significant edema  LLE No traumatic wounds, ecchymosis, or rash  Mod TTP hip  No knee or ankle effusion  Knee stable to varus/ valgus and anterior/posterior stress  Sens DPN, SPN, TN intact  Motor EHL, ext, flex, evers 5/5  DP 2+, PT 1+, No significant edema  Neurological: She is alert.  Skin: Skin is warm and dry. She is not diaphoretic.  Psychiatric: She has a normal mood and affect. Her behavior is normal.    Assessment/Plan: Left humerus fx -- Plan non-operative management. She says the sling is not especially helpful for the pain. Left hip fx -- Will get CT. Plan cannulated hip pinning at this point, likely by Dr. Ninfa Linden this afternoon. Right calf ulcer -- Will ask Dr. Sharol Given to evaluate Multiple medical problems including RA, osteoporosis, stress incontinence, depression, and HTN -- per primary service    Lisette Abu, PA-C Orthopedic Surgery [phone] 03/09/2019, 8:56 AM

## 2019-03-09 NOTE — Progress Notes (Signed)
RN attempted to call patient's daughter, Caren Griffins, related to patient's status post-op.   Patient is aware that RN is attempting call.   Nursing will continue to monitor.

## 2019-03-09 NOTE — Progress Notes (Signed)
Nutrition Follow-up  DOCUMENTATION CODES:   Not applicable  INTERVENTION:   -Once diet is advanced:  -MVI with minerals daily -1 packet Juven BID, each packet provides 95 calories, 2.5 grams of protein (collagen), and 9.8 grams of carbohydrate (3 grams sugar); also contains 7 grams of L-arginine and L-glutamine, 300 mg vitamin C, 15 mg vitamin E, 1.2 mcg vitamin B-12, 9.5 mg zinc, 200 mg calcium, and 1.5 g  Calcium Beta-hydroxy-Beta-methylbutyrate to support wound healing  NUTRITION DIAGNOSIS:   Increased nutrient needs related to wound healing, post-op healing as evidenced by estimated needs.  GOAL:   Patient will meet greater than or equal to 90% of their needs  MONITOR:   PO intake, Supplement acceptance, Diet advancement, Labs, Weight trends, Skin, I & O's  REASON FOR ASSESSMENT:   Consult Assessment of nutrition requirement/status  ASSESSMENT:   Michaela Simpson is a 85 y.o. female with medical history significant of RA, osteoporosis, stress incontinence, depression, HTN, who presents with fall, and pain in left shoulder and left upper leg and hip.  Pt admitted with closed lt humeral fracture and lt hip fracture.   Reviewed I/O's: -600 ml x 24 hours  UOP: 600 ml x 24 hours  Attempted to speak with pt x 2, however, receiving nursing care at times of visits.   Per orthopedics notes, pt with ulcer on rt calf that Dr. Sharol Given will evaluate on a later date. Plan for non-operative management of lt humerus fx and pinning of lt hip this afternoon. Pt currently NPO for procedure.   No recent wt history in chart to evaluate weight changes at this time.   Pt with increased nutrient needs for post-operative healing and wound healing; RD will add nutritional supplements with diet advancement.   Labs reviewed.   Diet Order:   Diet Order            Diet NPO time specified  Diet effective midnight              EDUCATION NEEDS:   No education needs have been  identified at this time  Skin:  Skin Assessment: Skin Integrity Issues: Skin Integrity Issues:: Other (Comment) Other: rt leg puncture wound, lt lower leg laceration  Last BM:  Unknown  Height:   Ht Readings from Last 1 Encounters:  03/09/19 5\' 2"  (1.575 m)    Weight:   Wt Readings from Last 1 Encounters:  03/09/19 51.4 kg    Ideal Body Weight:  50 kg  BMI:  Body mass index is 20.73 kg/m.  Estimated Nutritional Needs:   Kcal:  1500-1700  Protein:  65-80 grams  Fluid:  > 1.5 L    Acen Craun A. Jimmye Norman, RD, LDN, [place] Registered Dietitian II Certified Diabetes Care and Education Specialist Pager: [phone] After hours Pager: [phone]

## 2019-03-09 NOTE — Progress Notes (Signed)
PROGRESS NOTE        PATIENT DETAILS Name: Michaela Simpson Age: 85 y.o. Sex: female Date of Birth: [birth year] Admit Date: 03/08/2019 Admitting Physician Ivor Costa, MD [id number], Edwinna Areola, MD  Brief Narrative: Patient is a 85 y.o. female with history of HTN, depression, osteoporosis, RA, chronic right leg ulcer-presented with a mechanical fall-further evaluation revealed a left hip and left humerus fracture.  See below for further details  Subjective: Complains of pain at the left upper arm and left hip.  Assessment/Plan: Left hip fracture: Following a mechanical fall-orthopedics following-for surgical repair later today.  She is very active--in fact was working in her yard for couple of hours the day before she fell.  She has no chest pain or shortness of breath with physical activity-okay to proceed with surgery without any further work-up.  Left humerus fracture: Orthopedics planning on nonoperative treatment.  Right leg ulceration: Chronic issue-we will ask wound care to evaluate.  He follows at a local wound care center.  Do not think patient requires doxycycline-I do not see any evidence of infection-stop all antibiotics.  AKI: Likely hemodynamically mediated-repeat electrolytes tomorrow.  Mild leukocytosis: No indication of infection-suspect secondary to stress/fracture/inflammation.  HTN: BP controlled-continue with amlodipine  Depression: Stable-continue with Zoloft  Systolic murmur: Per patient this is a chronic issue-apparently she has had this since she was "young"-we will go and get a echocardiogram.  Diet: Diet Order            Diet NPO time specified  Diet effective midnight               DVT Prophylaxis: SCD's  Code Status: Full code   Family Communication: None at bedside  Disposition Plan: Remain inpatient-but will plan on Home health vs SNF on discharge  Antimicrobial agents: Anti-infectives (From admission, onward)    Start     Dose/Rate Route Frequency Ordered Stop   03/08/19 2300  doxycycline (VIBRA-TABS) tablet 100 mg     100 mg Oral Every 12 hours 03/08/19 2247        Procedures: None  CONSULTS:  orthopedic surgery  Time spent: 25- minutes-Greater than 50% of this time was spent in counseling, explanation of diagnosis, planning of further management, and coordination of care.  MEDICATIONS: Scheduled Meds: . amLODipine  2.5 mg Oral QHS  . doxycycline  100 mg Oral Q12H  . [START ON 03/10/2019] influenza vaccine adjuvanted  0.5 mL Intramuscular Tomorrow-1000  . multivitamin with minerals  1 tablet Oral Daily  . sertraline  50 mg Oral Daily   Continuous Infusions: PRN Meds:.acetaminophen, hydrALAZINE, methocarbamol, morphine injection, ondansetron (ZOFRAN) IV, oxyCODONE-acetaminophen, senna-docusate   PHYSICAL EXAM: Vital signs: Vitals:   03/08/19 2148 03/09/19 0427 03/09/19 0547 03/09/19 0857  BP: (!) 151/71 139/65  (!) 146/76  Pulse: 67 75  76  Resp: 15 14  15   Temp: 98.3 F (36.8 C) 98.5 F (36.9 C)  98.1 F (36.7 C)  TempSrc: Oral   Oral  SpO2: 100% 98%    Weight:   51.4 kg   Height:   5\' 2"  (1.575 m)    Filed Weights   03/09/19 0547  Weight: 51.4 kg   Body mass index is 20.73 kg/m.   Gen Exam:Alert awake-not in any distress HEENT:atraumatic, normocephalic Chest: B/L clear to auscultation anteriorly CVS:S1S2 regular-soft syst murmur-approx 3/6 Abdomen:soft non tender,  non distended Extremities:no edema Neurology: Non focal Skin: no rash  I have personally reviewed following labs and imaging studies  LABORATORY DATA: CBC: Recent Labs  Lab 03/08/19 2327  WBC 11.1*  HGB 12.8  HCT 39.7  MCV 95.9  PLT [id number]    Basic Metabolic Panel: Recent Labs  Lab 03/08/19 2327  NA 141  K 4.4  CL 108  CO2 21*  GLUCOSE 106*  BUN 20  CREATININE 1.25*  CALCIUM 9.4    GFR: Estimated Creatinine Clearance: 26 mL/min (A) (by C-G formula based on SCr of 1.25 mg/dL  (H)).  Liver Function Tests: No results for input(s): AST, ALT, ALKPHOS, BILITOT, PROT, ALBUMIN in the last 168 hours. No results for input(s): LIPASE, AMYLASE in the last 168 hours. No results for input(s): AMMONIA in the last 168 hours.  Coagulation Profile: Recent Labs  Lab 03/08/19 2133  INR 1.0    Cardiac Enzymes: No results for input(s): CKTOTAL, CKMB, CKMBINDEX, TROPONINI in the last 168 hours.  BNP (last 3 results) No results for input(s): PROBNP in the last 8760 hours.  HbA1C: No results for input(s): HGBA1C in the last 72 hours.  CBG: No results for input(s): GLUCAP in the last 168 hours.  Lipid Profile: No results for input(s): CHOL, HDL, LDLCALC, TRIG, CHOLHDL, LDLDIRECT in the last 72 hours.  Thyroid Function Tests: No results for input(s): TSH, T4TOTAL, FREET4, T3FREE, THYROIDAB in the last 72 hours.  Anemia Panel: No results for input(s): VITAMINB12, FOLATE, FERRITIN, TIBC, IRON, RETICCTPCT in the last 72 hours.  Urine analysis:    Component Value Date/Time   COLORURINE YELLOW 03/14/2015 1421   APPEARANCEUR HAZY (A) 03/14/2015 1421   LABSPEC 1.010 03/14/2015 1421   PHURINE 7.0 03/14/2015 1421   GLUCOSEU NEGATIVE 03/14/2015 1421   HGBUR NEGATIVE 03/14/2015 1421   BILIRUBINUR NEGATIVE 03/14/2015 1421   KETONESUR 15 (A) 03/14/2015 1421   PROTEINUR NEGATIVE 03/14/2015 1421   UROBILINOGEN 0.2 03/14/2015 1421   NITRITE NEGATIVE 03/14/2015 1421   LEUKOCYTESUR NEGATIVE 03/14/2015 1421    Sepsis Labs: Lactic Acid, Venous No results found for: LATICACIDVEN  MICROBIOLOGY: Recent Results (from the past 240 hour(s))  SARS Coronavirus 2 [facility] order, Performed in [facility] hospital lab) Nasopharyngeal Nasopharyngeal Swab     Status: None   Collection Time: 03/08/19  9:45 PM   Specimen: Nasopharyngeal Swab  Result Value Ref Range Status   SARS Coronavirus 2 NEGATIVE NEGATIVE Final    Comment: (NOTE) If result is NEGATIVE SARS-CoV-2 target nucleic  acids are NOT DETECTED. The SARS-CoV-2 RNA is generally detectable in upper and lower  respiratory specimens during the acute phase of infection. The lowest  concentration of SARS-CoV-2 viral copies this assay can detect is 250  copies / mL. A negative result does not preclude SARS-CoV-2 infection  and should not be used as the sole basis for treatment or other  patient management decisions.  A negative result may occur with  improper specimen collection / handling, submission of specimen other  than nasopharyngeal swab, presence of viral mutation(s) within the  areas targeted by this assay, and inadequate number of viral copies  (<250 copies / mL). A negative result must be combined with clinical  observations, patient history, and epidemiological information. If result is POSITIVE SARS-CoV-2 target nucleic acids are DETECTED. The SARS-CoV-2 RNA is generally detectable in upper and lower  respiratory specimens dur ing the acute phase of infection.  Positive  results are indicative of active infection with SARS-CoV-2.  Clinical  correlation with patient history and other diagnostic information is  necessary to determine patient infection status.  Positive results do  not rule out bacterial infection or co-infection with other viruses. If result is PRESUMPTIVE POSTIVE SARS-CoV-2 nucleic acids MAY BE PRESENT.   A presumptive positive result was obtained on the submitted specimen  and confirmed on repeat testing.  While 2019 novel coronavirus  (SARS-CoV-2) nucleic acids may be present in the submitted sample  additional confirmatory testing may be necessary for epidemiological  and / or clinical management purposes  to differentiate between  SARS-CoV-2 and other Sarbecovirus currently known to infect humans.  If clinically indicated additional testing with an alternate test  methodology [phone]) is advised. The SARS-CoV-2 RNA is generally  detectable in upper and lower respiratory sp  ecimens during the acute  phase of infection. The expected result is Negative. Fact Sheet for Patients:  StrictlyIdeas.no Fact Sheet for Healthcare Providers: BankingDealers.co.za This test is not yet approved or cleared by the [place] FDA and has been authorized for detection and/or diagnosis of SARS-CoV-2 by FDA under an Emergency Use Authorization (EUA).  This EUA will remain in effect (meaning this test can be used) for the duration of the COVID-19 declaration under Section 564(b)(1) of the Act, 21 U.S.C. section 360bbb-3(b)(1), unless the authorization is terminated or revoked sooner. Performed at [facility], [place] [address], [place], [place] [address]     RADIOLOGY STUDIES/RESULTS: No results found.   LOS: 1 day   Oren Binet, MD  [facility]  If 7PM-7AM, please contact night-coverage  Please page via [email or web address]  Go to [email or web address] and use [facility]'s universal password to access. If you do not have the password, please contact the hospital operator.  Locate the [facility] provider you are looking for under [facility] and page to a number that you can be directly reached. If you still have difficulty reaching the provider, please page the [facility] (Director on Call) for the Hospitalists listed on amion for assistance.  03/09/2019, 10:29 AM

## 2019-03-09 NOTE — Plan of Care (Signed)
  Problem: Education: Goal: Knowledge of General Education information will improve Description Including pain rating scale, medication(s)/side effects and non-pharmacologic comfort measures Outcome: Progressing   

## 2019-03-09 NOTE — Progress Notes (Signed)
Pt had x3 episodes of emesis with a total of 90cc. Emesis thin and clear.Pt c/o hunger pangs and feels her nausea is r/t such. Assessment and VS noted. MD notified. Started IVF and given meds per orders. Updated daughter Caren Griffins of pt condition. Pt eyes closed and resting quietly with even, unlabored respirations. Will continue to monitor.

## 2019-03-09 NOTE — Progress Notes (Signed)
  Echocardiogram 2D Echocardiogram has been performed.  Johny Chess 03/09/2019, 4:35 PM

## 2019-03-09 NOTE — Progress Notes (Signed)
CSW acknowledges consult for SNF. PT/OT are to see the patient and evaluate her. CSW will assist with disposition based off therapy recommendations.   CSW will continue to follow.   Domenic Schwab, MSW, [place] Worker [facility]  [phone]

## 2019-03-09 NOTE — Brief Op Note (Signed)
03/09/2019  6:10 PM  PATIENT:  Michaela Simpson  85 y.o. female  PRE-OPERATIVE DIAGNOSIS:  hip fracture  POST-OPERATIVE DIAGNOSIS:  hip fracture  PROCEDURE:  Procedure(s): CANNULATED HIP PINNING (Left)  SURGEON:  Surgeon(s) and Role:    Mcarthur Rossetti, MD - Primary  ANESTHESIA:   general  EBL:  100 mL   COUNTS:  YES  DICTATION: .Other Dictation: Dictation Number [id number]  PLAN OF CARE: Admit to inpatient   PATIENT DISPOSITION:  PACU - hemodynamically stable.   Delay start of Pharmacological VTE agent (>24hrs) due to surgical blood loss or risk of bleeding: no

## 2019-03-09 NOTE — Anesthesia Preprocedure Evaluation (Addendum)
Anesthesia Evaluation  Patient identified by MRN, date of birth, ID band Patient awake    Reviewed: Allergy & Precautions, NPO status , Patient's Chart, lab work & pertinent test results  Airway Mallampati: II  TM Distance: >3 FB Neck ROM: Full    Dental  (+) Dental Advisory Given   Pulmonary neg pulmonary ROS,    Pulmonary exam normal        Cardiovascular hypertension, Pt. on medications Normal cardiovascular exam+ Valvular Problems/Murmurs AS  Rhythm:Regular Rate:Normal + Systolic murmurs Severe AS   Neuro/Psych PSYCHIATRIC DISORDERS Depression negative neurological ROS     GI/Hepatic negative GI ROS, Neg liver ROS,   Endo/Other  negative endocrine ROS  Renal/GU Renal disease  negative genitourinary   Musculoskeletal  (+) Arthritis , Rheumatoid disorders,    Abdominal   Peds  Hematology  (+) Blood dyscrasia, anemia ,   Anesthesia Other Findings   Reproductive/Obstetrics                          Anesthesia Physical Anesthesia Plan  ASA: IV  Anesthesia Plan: General   Post-op Pain Management:    Induction: Intravenous  PONV Risk Score and Plan: 3 and Dexamethasone, Ondansetron and Treatment may vary due to age or medical condition  Airway Management Planned: Oral ETT  Additional Equipment: None  Intra-op Plan:   Post-operative Plan: Possible Post-op intubation/ventilation  Informed Consent: I have reviewed the patients History and Physical, chart, labs and discussed the procedure including the risks, benefits and alternatives for the proposed anesthesia with the patient or authorized representative who has indicated his/her understanding and acceptance.     Dental advisory given  Plan Discussed with: CRNA  Anesthesia Plan Comments: (Patient at extreme risk due to age and severe uncorrected aortic stenosis)      Anesthesia Quick Evaluation

## 2019-03-09 NOTE — Plan of Care (Signed)
  Problem: Pain Managment: Goal: General experience of comfort will improve Outcome: Progressing   

## 2019-03-10 LAB — BASIC METABOLIC PANEL
Anion gap: 14 (ref 5–15)
BUN: 22 mg/dL (ref 8–23)
CO2: 21 mmol/L — ABNORMAL LOW (ref 22–32)
Calcium: 8.7 mg/dL — ABNORMAL LOW (ref 8.9–10.3)
Chloride: 104 mmol/L (ref 98–111)
Creatinine, Ser: 1.02 mg/dL — ABNORMAL HIGH (ref 0.44–1.00)
GFR calc Af Amer: 58 mL/min — ABNORMAL LOW (ref >60)
GFR calc non Af Amer: 50 mL/min — ABNORMAL LOW (ref >60)
Glucose, Bld: 116 mg/dL — ABNORMAL HIGH (ref 70–99)
Potassium: 4.6 mmol/L (ref 3.5–5.1)
Sodium: 139 mmol/L (ref 135–145)

## 2019-03-10 LAB — CBC
HCT: 33.8 % — ABNORMAL LOW (ref 36.0–46.0)
Hemoglobin: 11 g/dL — ABNORMAL LOW (ref 12.0–15.0)
MCH: 31.5 pg (ref 26.0–34.0)
MCHC: 32.5 g/dL (ref 30.0–36.0)
MCV: 96.8 fL (ref 80.0–100.0)
Platelets: 213 10*3/uL (ref 150–400)
RBC: 3.49 MIL/uL — ABNORMAL LOW (ref 3.87–5.11)
RDW: 13.7 % (ref 11.5–15.5)
WBC: 12.2 10*3/uL — ABNORMAL HIGH (ref 4.0–10.5)
nRBC: 0 % (ref 0.0–0.2)

## 2019-03-10 NOTE — Progress Notes (Signed)
Bladder scan patient volume of 667 ccs.   Two RN's attempted to in and out cath the patient with complete resistance upon insertion.   RN was given report from PACU earlier in the shift which reported that they attempted foley insertion with no success.    Dr. Ninfa Linden paged for advisement on the next steps.   Nursing will continue to monitor.

## 2019-03-10 NOTE — Progress Notes (Signed)
Patient ID: Michaela Simpson, female   DOB: October 10, 1931, 85 y.o.   MRN: [id number] The patient tolerated the surgery yesterday very well on her left hip.  Cannulated screws were placed in the left hip to stabilize the nondisplaced femoral neck fracture.  She does state that it hurts much less today than it did yesterday.  An echocardiogram did reveal significant aortic stenosis.  Her left proximal humerus fracture will be treated nonoperatively in a sling.  From a mobility standpoint, I am fine with therapy attempting weightbearing as tolerated on her left operative hip.  It may be best just to pivot into a wheelchair if the patient is having significant balance issues.  She cannot put weight through her left shoulder but can potentially try a platform walker on the left side through her forearm.  Her vital signs are stable this morning and her hemoglobin-hematocrit is stable postop.  From a DVT coverage standpoint, usually for a minimally invasive surgery like this we would just recommend a 325 mg aspirin daily and less she is on other blood thinning medications routinely.  It was noted this morning the patient has not been able to void.  Nursing was unsuccessful on multiple attempts to place a Foley catheter.  The patient denies any previous urologic type of issues or surgery.  Urology will need to be consulted for further assessment and likely Foley placement.

## 2019-03-10 NOTE — Evaluation (Signed)
Physical Therapy Evaluation Patient Details Name: Michaela Simpson MRN: [id number] DOB: 1931-10-23 Today's Date: 03/10/2019   History of Present Illness  85 yo female with onset of fall at home in yard was admitted for her L femoral neck fracture with ORIF done on 9/18.  Has L humeral fracture with sling in place and NWB, WBAT on L hip.  Has leukocytosis, AKI.  PMHx:  osteoporosis, RA, stress incontinence, depression, HTN, falls, R hip fracture with ORIF, R leg wound chronic, falls, cardiac history   Clinical Impression  Pt was seen for mobility from chair level where nursing had already assisted her.  Pt is able to help stand but is trying to control balance with LUE, at which point PT had to be persistent to stop her.  Follow up with acute therapy to increase standign balance control and to work on gait skills to reduce her time in SNF for rehab.    Follow Up Recommendations SNF    Equipment Recommendations  None recommended by PT    Recommendations for Other Services       Precautions / Restrictions Precautions Precautions: Fall Precaution Comments: WBAT L hip and NWB L UE Restrictions Weight Bearing Restrictions: Yes LUE Weight Bearing: Non weight bearing LLE Weight Bearing: Weight bearing as tolerated      Mobility  Bed Mobility Overal bed mobility: Needs Assistance                Transfers Overall transfer level: Needs assistance Equipment used: 1 person hand held assist;Hemi-walker Transfers: Sit to/from Stand Sit to Stand: Mod assist         General transfer comment: mod to control power up and to transfer R hand to  hemiwalker, but pt also requires help to avoid using LUE on hemiwalker  Ambulation/Gait         Gait velocity: reduced Gait velocity interpretation: <1.8 ft/sec, indicate of risk for recurrent falls General Gait Details: shifting wgt and stepping x 6 steps with dense cues for control of balance and to line up to sit down  Stairs             Wheelchair Mobility    Modified Rankin (Stroke Patients Only)       Balance Overall balance assessment: Needs assistance;History of Falls Sitting-balance support: Feet supported Sitting balance-Leahy Scale: Fair     Standing balance support: Single extremity supported;During functional activity Standing balance-Leahy Scale: Poor                               Pertinent Vitals/Pain Pain Assessment: Faces Faces Pain Scale: Hurts whole lot Pain Location: L hip and UE Pain Descriptors / Indicators: Operative site guarding;Discomfort;Grimacing;Guarding Pain Intervention(s): Limited activity within patient's tolerance;Monitored during session;Premedicated before session;Repositioned;Patient requesting pain meds-RN notified    Home Living Family/patient expects to be discharged to:: Skilled nursing facility Living Arrangements: Alone Available Help at Discharge: Family;Friend(s);Available PRN/intermittently Type of Home: House         Home Equipment: Walker - 2 wheels;Cane - single point      Prior Function Level of Independence: Independent with assistive device(s)         Comments: SPC was main device     Hand Dominance   Dominant Hand: Right    Extremity/Trunk Assessment   Upper Extremity Assessment Upper Extremity Assessment: LUE deficits/detail LUE Deficits / Details: in sling with humeral fracture LUE: Unable to fully assess due to pain  LUE Coordination: decreased fine motor;decreased gross motor    Lower Extremity Assessment Lower Extremity Assessment: Generalized weakness;LLE deficits/detail LLE Deficits / Details: pain with mobility due to L hip pinning LLE: Unable to fully assess due to pain LLE Coordination: decreased fine motor;decreased gross motor    Cervical / Trunk Assessment Cervical / Trunk Assessment: Kyphotic  Communication   Communication: No difficulties  Cognition Arousal/Alertness: Awake/alert Behavior  During Therapy: WFL for tasks assessed/performed Overall Cognitive Status: Within Functional Limits for tasks assessed                                        General Comments General comments (skin integrity, edema, etc.): Pt is up to stand with hemiwalker and is struggling to step but mainly due to proprioceptive issues and weakness on L hip.    Exercises     Assessment/Plan    PT Assessment Patient needs continued PT services  PT Problem List Decreased strength;Decreased range of motion;Decreased activity tolerance;Decreased balance;Decreased coordination;Decreased mobility;Decreased safety awareness;Decreased skin integrity;Pain       PT Treatment Interventions DME instruction;Gait training;Functional mobility training;Therapeutic activities;Therapeutic exercise;Balance training;Neuromuscular re-education;Patient/family education    PT Goals (Current goals can be found in the Care Plan section)  Acute Rehab PT Goals Patient Stated Goal: to go home and avoid a fall PT Goal Formulation: With patient Time For Goal Achievement: 03/24/19 Potential to Achieve Goals: Good    Frequency Min 2X/week   Barriers to discharge Inaccessible home environment;Decreased caregiver support home alone and has limits that keep her from managing independently    Co-evaluation               AM-PAC PT "6 Clicks" Mobility  Outcome Measure Help needed turning from your back to your side while in a flat bed without using bedrails?: A Little Help needed moving from lying on your back to sitting on the side of a flat bed without using bedrails?: A Lot Help needed moving to and from a bed to a chair (including a wheelchair)?: A Little Help needed standing up from a chair using your arms (e.g., wheelchair or bedside chair)?: A Lot Help needed to walk in hospital room?: A Lot Help needed climbing 3-5 steps with a railing? : Total 6 Click Score: 13    End of Session Equipment  Utilized During Treatment: Gait belt Activity Tolerance: Patient tolerated treatment well Patient left: in chair;with call bell/phone within reach;with chair alarm set Nurse Communication: Mobility status PT Visit Diagnosis: Unsteadiness on feet (R26.81);Difficulty in walking, not elsewhere classified (R26.2);Pain Pain - Right/Left: Left Pain - part of body: Hip;Knee;Leg;Arm;Shoulder    Time: [id number] PT Time Calculation (min) (ACUTE ONLY): 29 min   Charges:   PT Evaluation $PT Eval Moderate Complexity: 1 Mod PT Treatments $Therapeutic Activity: 8-22 mins       Ramond Dial 03/10/2019, 4:06 PM   Mee Hives, PT MS Acute Rehab Dept. Number: [place] [facility] [place]

## 2019-03-10 NOTE — Plan of Care (Signed)
  Problem: Education: Goal: Knowledge of General Education information will improve Description: Including pain rating scale, medication(s)/side effects and non-pharmacologic comfort measures Outcome: Progressing   Problem: Health Behavior/Discharge Planning: Goal: Ability to manage health-related needs will improve Outcome: Progressing   Problem: Activity: Goal: Risk for activity intolerance will decrease Outcome: Progressing   Problem: Elimination: Goal: Will not experience complications related to urinary retention Outcome: Progressing

## 2019-03-10 NOTE — Progress Notes (Signed)
PROGRESS NOTE        PATIENT DETAILS Name: Michaela Simpson Age: 85 y.o. Sex: female Date of Birth: [birth year] Admit Date: 03/08/2019 Admitting Physician Ivor Costa, MD [id number], Edwinna Areola, MD  Brief Narrative: Patient is a 85 y.o. female with history of HTN, depression, osteoporosis, RA, chronic right leg ulcer-presented with a mechanical fall-further evaluation revealed a left hip and left humerus fracture.  See below for further details  Subjective: Feels much better-up in bedside chair.  Assessment/Plan: Left hip fracture: Following a mechanical fall-underwent cannulated screw placement on 9/18.  Ortho recommending weightbearing as tolerated on the left lower extremity, and aspirin 325 mg daily for VTE prophylaxis.  Await eval by PT services.    Left humerus fracture: Orthopedics planning on nonoperative treatment with sling.  Right leg ulceration: Chronic issue-we will ask wound care to evaluate.  He follows at a local wound care center.  Do not think patient requires doxycycline-I do not see any evidence of infection-stop all antibiotics.  AKI: Likely hemodynamically mediated-resolved with supportive care.  Mild leukocytosis: No indication of infection-suspect secondary to stress/fracture/inflammation.  Aortic stenosis: Severe by echocardiogram-but patient appears to be remarkably asymptomatic-apparently is very functional-and in fact worked in the yard for 2 hours a day before she broke her hip.  She seems to have tolerated surgery pretty well.  Spoke with cardiology on-call Dr. Christopher-recommendations are for outpatient follow-up-we will send epic message to Dr. Burt Knack to see if this patient can be a candidate for TAVR.  HTN: BP controlled-continue amlodipine  Depression: Stable-continue with Zoloft   Diet: Diet Order            Diet regular Room service appropriate? Yes; Fluid consistency: Thin  Diet effective now               DVT  Prophylaxis: SCD's  Code Status: Full code   Family Communication: None at bedside  Disposition Plan: Remain inpatient-but will plan on Home health vs SNF on discharge-await PT eval  Antimicrobial agents: Anti-infectives (From admission, onward)   Start     Dose/Rate Route Frequency Ordered Stop   03/09/19 2030  vancomycin (VANCOCIN) IVPB 1000 mg/200 mL premix     1,000 mg 200 mL/hr over 60 Minutes Intravenous Every 12 hours 03/09/19 2000 03/10/19 0111   03/08/19 2300  doxycycline (VIBRA-TABS) tablet 100 mg  Status:  Discontinued     100 mg Oral Every 12 hours 03/08/19 2247 03/09/19 1041      Procedures: None  CONSULTS:  orthopedic surgery  Time spent: 25- minutes-Greater than 50% of this time was spent in counseling, explanation of diagnosis, planning of further management, and coordination of care.  MEDICATIONS: Scheduled Meds: . amLODipine  2.5 mg Oral QHS  . aspirin EC  325 mg Oral Q breakfast  . docusate sodium  100 mg Oral BID  . fentaNYL (SUBLIMAZE) injection  50 mcg Intravenous Once  . influenza vaccine adjuvanted  0.5 mL Intramuscular Tomorrow-1000  . multivitamin with minerals  1 tablet Oral Daily  . nutrition supplement (JUVEN)  1 packet Oral BID BM  . pantoprazole  40 mg Oral Daily  . sertraline  50 mg Oral Daily   Continuous Infusions: . sodium chloride 75 mL/hr (03/09/19 2145)  . methocarbamol (ROBAXIN) IV     PRN Meds:.acetaminophen, alum & mag hydroxide-simeth, hydrALAZINE, HYDROcodone-acetaminophen, HYDROcodone-acetaminophen, HYDROmorphone (DILAUDID)  injection, menthol-cetylpyridinium **OR** phenol, methocarbamol **OR** methocarbamol (ROBAXIN) IV, methocarbamol, metoCLOPramide **OR** metoCLOPramide (REGLAN) injection, morphine injection, ondansetron **OR** ondansetron (ZOFRAN) IV, oxyCODONE-acetaminophen, promethazine, senna-docusate   PHYSICAL EXAM: Vital signs: Vitals:   03/09/19 2001 03/09/19 2331 03/10/19 0407 03/10/19 0753  BP: 139/70  115/74 129/71 104/61  Pulse: 85 97 79 95  Resp: 18 18 17 14   Temp: 98.4 F (36.9 C) 98.1 F (36.7 C) 97.9 F (36.6 C) 98.5 F (36.9 C)  TempSrc: Axillary Oral Oral Oral  SpO2: 100% 100% 100% 100%  Weight:      Height:       Filed Weights   03/09/19 0547  Weight: 51.4 kg   Body mass index is 20.73 kg/m.   Gen Exam:Alert awake-not in any distress HEENT:atraumatic, normocephalic Chest: B/L clear to auscultation anteriorly CVS:S1S2 regular,+3/6 syst murmur Abdomen:soft non tender, non distended Extremities:no edema Neurology: Non focal Skin: no rash  I have personally reviewed following labs and imaging studies  LABORATORY DATA: CBC: Recent Labs  Lab 03/08/19 2327 03/10/19 0458  WBC 11.1* 12.2*  HGB 12.8 11.0*  HCT 39.7 33.8*  MCV 95.9 96.8  PLT [id number]    Basic Metabolic Panel: Recent Labs  Lab 03/08/19 2327 03/10/19 0458  NA 141 139  K 4.4 4.6  CL 108 104  CO2 21* 21*  GLUCOSE 106* 116*  BUN 20 22  CREATININE 1.25* 1.02*  CALCIUM 9.4 8.7*    GFR: Estimated Creatinine Clearance: 31.9 mL/min (A) (by C-G formula based on SCr of 1.02 mg/dL (H)).  Liver Function Tests: No results for input(s): AST, ALT, ALKPHOS, BILITOT, PROT, ALBUMIN in the last 168 hours. No results for input(s): LIPASE, AMYLASE in the last 168 hours. No results for input(s): AMMONIA in the last 168 hours.  Coagulation Profile: Recent Labs  Lab 03/08/19 2133  INR 1.0    Cardiac Enzymes: No results for input(s): CKTOTAL, CKMB, CKMBINDEX, TROPONINI in the last 168 hours.  BNP (last 3 results) No results for input(s): PROBNP in the last 8760 hours.  HbA1C: No results for input(s): HGBA1C in the last 72 hours.  CBG: No results for input(s): GLUCAP in the last 168 hours.  Lipid Profile: No results for input(s): CHOL, HDL, LDLCALC, TRIG, CHOLHDL, LDLDIRECT in the last 72 hours.  Thyroid Function Tests: No results for input(s): TSH, T4TOTAL, FREET4, T3FREE, THYROIDAB in  the last 72 hours.  Anemia Panel: No results for input(s): VITAMINB12, FOLATE, FERRITIN, TIBC, IRON, RETICCTPCT in the last 72 hours.  Urine analysis:    Component Value Date/Time   COLORURINE YELLOW 03/14/2015 1421   APPEARANCEUR HAZY (A) 03/14/2015 1421   LABSPEC 1.010 03/14/2015 1421   PHURINE 7.0 03/14/2015 1421   GLUCOSEU NEGATIVE 03/14/2015 1421   HGBUR NEGATIVE 03/14/2015 1421   BILIRUBINUR NEGATIVE 03/14/2015 1421   KETONESUR 15 (A) 03/14/2015 1421   PROTEINUR NEGATIVE 03/14/2015 1421   UROBILINOGEN 0.2 03/14/2015 1421   NITRITE NEGATIVE 03/14/2015 1421   LEUKOCYTESUR NEGATIVE 03/14/2015 1421    Sepsis Labs: Lactic Acid, Venous No results found for: LATICACIDVEN  MICROBIOLOGY: Recent Results (from the past 240 hour(s))  SARS Coronavirus 2 [facility] order, Performed in [facility] hospital lab) Nasopharyngeal Nasopharyngeal Swab     Status: None   Collection Time: 03/08/19  9:45 PM   Specimen: Nasopharyngeal Swab  Result Value Ref Range Status   SARS Coronavirus 2 NEGATIVE NEGATIVE Final    Comment: (NOTE) If result is NEGATIVE SARS-CoV-2 target nucleic acids are NOT DETECTED. The SARS-CoV-2 RNA  is generally detectable in upper and lower  respiratory specimens during the acute phase of infection. The lowest  concentration of SARS-CoV-2 viral copies this assay can detect is 250  copies / mL. A negative result does not preclude SARS-CoV-2 infection  and should not be used as the sole basis for treatment or other  patient management decisions.  A negative result may occur with  improper specimen collection / handling, submission of specimen other  than nasopharyngeal swab, presence of viral mutation(s) within the  areas targeted by this assay, and inadequate number of viral copies  (<250 copies / mL). A negative result must be combined with clinical  observations, patient history, and epidemiological information. If result is POSITIVE SARS-CoV-2 target nucleic  acids are DETECTED. The SARS-CoV-2 RNA is generally detectable in upper and lower  respiratory specimens dur ing the acute phase of infection.  Positive  results are indicative of active infection with SARS-CoV-2.  Clinical  correlation with patient history and other diagnostic information is  necessary to determine patient infection status.  Positive results do  not rule out bacterial infection or co-infection with other viruses. If result is PRESUMPTIVE POSTIVE SARS-CoV-2 nucleic acids MAY BE PRESENT.   A presumptive positive result was obtained on the submitted specimen  and confirmed on repeat testing.  While 2019 novel coronavirus  (SARS-CoV-2) nucleic acids may be present in the submitted sample  additional confirmatory testing may be necessary for epidemiological  and / or clinical management purposes  to differentiate between  SARS-CoV-2 and other Sarbecovirus currently known to infect humans.  If clinically indicated additional testing with an alternate test  methodology [phone]) is advised. The SARS-CoV-2 RNA is generally  detectable in upper and lower respiratory sp ecimens during the acute  phase of infection. The expected result is Negative. Fact Sheet for Patients:  StrictlyIdeas.no Fact Sheet for Healthcare Providers: BankingDealers.co.za This test is not yet approved or cleared by the [place] FDA and has been authorized for detection and/or diagnosis of SARS-CoV-2 by FDA under an Emergency Use Authorization (EUA).  This EUA will remain in effect (meaning this test can be used) for the duration of the COVID-19 declaration under Section 564(b)(1) of the Act, 21 U.S.C. section 360bbb-3(b)(1), unless the authorization is terminated or revoked sooner. Performed at [facility], [place] [address] [place], [place] [address]   Culture, blood (Routine X 2) w Reflex to ID Panel     Status: None (Preliminary result)    Collection Time: 03/08/19 11:27 PM   Specimen: BLOOD  Result Value Ref Range Status   Specimen Description BLOOD RIGHT ANTECUBITAL  Final   Special Requests   Final    BOTTLES DRAWN AEROBIC ONLY Blood Culture adequate volume   Culture   Final    NO GROWTH < 24 HOURS Performed at [facility], [place] [address], [place], [place] [address]    Report Status PENDING  Incomplete  Culture, blood (Routine X 2) w Reflex to ID Panel     Status: None (Preliminary result)   Collection Time: 03/08/19 11:27 PM   Specimen: BLOOD RIGHT HAND  Result Value Ref Range Status   Specimen Description BLOOD RIGHT HAND  Final   Special Requests   Final    BOTTLES DRAWN AEROBIC ONLY Blood Culture adequate volume   Culture   Final    NO GROWTH < 24 HOURS Performed at [facility], [place] [address], [place], [place] [address]  Report Status PENDING  Incomplete  Surgical PCR screen     Status: None   Collection Time: 03/09/19 11:17 AM   Specimen: Nasal Mucosa; Nasal Swab  Result Value Ref Range Status   MRSA, PCR NEGATIVE NEGATIVE Final   Staphylococcus aureus NEGATIVE NEGATIVE Final    Comment: (NOTE) The Xpert SA Assay (FDA approved for NASAL specimens in patients 75 years of age and older), is one component of a comprehensive surveillance program. It is not intended to diagnose infection nor to guide or monitor treatment. Performed at [facility], [place] [address], [place], [place] [address]     RADIOLOGY STUDIES/RESULTS: Pelvis Portable  Result Date: 03/09/2019 CLINICAL DATA:  Percutaneous pinning EXAM: PORTABLE PELVIS 1-2 VIEWS COMPARISON:  03/09/2019 FINDINGS: The patient has undergone percutaneous pinning of the left hip. The alignment is stable. The hardware is intact. Expected postsurgical changes are noted. The patient is status post remote right-sided intramedullary nail placement. IMPRESSION: Satisfactory postoperative appearance following percutaneous pinning of the left hip.  No evidence of hardware complication. Electronically Signed   By: Constance Holster M.D.   On: 03/09/2019 19:07   Ct Hip Left Wo Contrast  Result Date: 03/09/2019 CLINICAL DATA:  Left hip pain after fall EXAM: CT OF THE LEFT HIP WITHOUT CONTRAST TECHNIQUE: Multidetector CT imaging of the left hip was performed according to the standard protocol. Multiplanar CT image reconstructions were also generated. COMPARISON:  X-ray 03/08/2019 FINDINGS: Bones/Joint/Cartilage Acute mildly impacted subcapital fracture of the proximal left femur. Approximately 1.4 cm of foreshortening. No involvement of the intertrochanteric aspect of the femur. Femoral head contour appears maintained without definite fracture involvement. Mild left hip joint space narrowing. No dislocation. No additional fracture is identified. There are moderate degenerative changes of the visualized left sacroiliac joint. Ligaments Suboptimally assessed by CT. Muscles and Tendons No significant muscular atrophy. Tendinous structures grossly intact. Soft tissues No large soft tissue hematoma. There is sigmoid diverticulosis without acute finding within the pelvis. IMPRESSION: Acute, mildly impacted subcapital fracture of the proximal left femur. Electronically Signed   By: Davina Poke M.D.   On: 03/09/2019 13:37   Dg C-arm 1-60 Min  Result Date: 03/09/2019 CLINICAL DATA:  Percutaneous pinning of the left hip EXAM: DG C-ARM 1-60 MIN FLUOROSCOPY TIME:  Fluoroscopy Time:  1 minutes and 29 seconds Number of Acquired Spot Images: 7 COMPARISON:  03/09/2019 FINDINGS: There are expected postsurgical changes related to left hip percutaneous pinning. The hardware is intact. There is no unexpected radiopaque foreign body. IMPRESSION: Status post left hip percutaneous pinning. No unexpected radiopaque foreign body. Electronically Signed   By: Constance Holster M.D.   On: 03/09/2019 19:07   Dg Hip Operative Unilat W Or W/o Pelvis Left  Result Date:  03/09/2019 CLINICAL DATA:  Hip pending EXAM: OPERATIVE LEFT HIP (WITH PELVIS IF PERFORMED) 7 VIEWS TECHNIQUE: Fluoroscopic spot image(s) were submitted for interpretation post-operatively. COMPARISON:  03/08/2019 FINDINGS: The patient has undergone percutaneous pinning of the left hip. The alignment is stable. The hardware appears intact. There is no unexpected radiopaque foreign body. Expected postsurgical changes are noted. IMPRESSION: Status post percutaneous pinning of the left hip. No evidence of hardware complication or unexpected radiopaque foreign body. Electronically Signed   By: Constance Holster M.D.   On: 03/09/2019 19:06     LOS: 2 days   Oren Binet, MD  [facility]  If 7PM-7AM, please contact night-coverage  Please page via [email or web address]  Go to [email or web address] and use [facility] universal  password to access. If you do not have the password, please contact the hospital operator.  Locate the [facility] provider you are looking for under [facility] and page to a number that you can be directly reached. If you still have difficulty reaching the provider, please page the [facility] (Director on Call) for the Hospitalists listed on amion for assistance.  03/10/2019, 11:15 AM

## 2019-03-10 NOTE — Progress Notes (Signed)
Dr. Ninfa Linden returned RN's call.   Advised RN to notify [facility] for possible urology consult.   RN notified [facility].   Nursing will continue to monitor.

## 2019-03-11 ENCOUNTER — Other Ambulatory Visit: Payer: Self-pay

## 2019-03-11 LAB — CBC
HCT: 26.3 % — ABNORMAL LOW (ref 36.0–46.0)
Hemoglobin: 8.6 g/dL — ABNORMAL LOW (ref 12.0–15.0)
MCH: 31.6 pg (ref 26.0–34.0)
MCHC: 32.7 g/dL (ref 30.0–36.0)
MCV: 96.7 fL (ref 80.0–100.0)
Platelets: 191 10*3/uL (ref 150–400)
RBC: 2.72 MIL/uL — ABNORMAL LOW (ref 3.87–5.11)
RDW: 13.8 % (ref 11.5–15.5)
WBC: 8 10*3/uL (ref 4.0–10.5)
nRBC: 0 % (ref 0.0–0.2)

## 2019-03-11 LAB — BASIC METABOLIC PANEL
Anion gap: 9 (ref 5–15)
BUN: 35 mg/dL — ABNORMAL HIGH (ref 8–23)
CO2: 24 mmol/L (ref 22–32)
Calcium: 8.3 mg/dL — ABNORMAL LOW (ref 8.9–10.3)
Chloride: 104 mmol/L (ref 98–111)
Creatinine, Ser: 1.27 mg/dL — ABNORMAL HIGH (ref 0.44–1.00)
GFR calc Af Amer: 45 mL/min — ABNORMAL LOW (ref >60)
GFR calc non Af Amer: 38 mL/min — ABNORMAL LOW (ref >60)
Glucose, Bld: 97 mg/dL (ref 70–99)
Potassium: 4.4 mmol/L (ref 3.5–5.1)
Sodium: 137 mmol/L (ref 135–145)

## 2019-03-11 NOTE — Plan of Care (Signed)

## 2019-03-11 NOTE — Progress Notes (Addendum)
PROGRESS NOTE        PATIENT DETAILS Name: Michaela Simpson Age: 85 y.o. Sex: female Date of Birth: [birth year] Admit Date: 03/08/2019 Admitting Physician Ivor Costa, MD [id number], Edwinna Areola, MD  Brief Narrative: Patient is a 85 y.o. female with history of HTN, depression, osteoporosis, RA, chronic right leg ulcer-presented with a mechanical fall-further evaluation revealed a left hip and left humerus fracture.  See below for further details  Subjective: Feels much better-up in bedside chair.  Assessment/Plan: Left hip fracture: Following a mechanical fall-underwent cannulated screw placement on 9/18.  Ortho recommending weightbearing as tolerated on the left lower extremity, and aspirin 325 mg daily for VTE prophylaxis.  SW consulted for SNF placement for rehab  Left humerus fracture: Orthopedics planning on nonoperative treatment with sling.  Right leg ulceration: Chronic issue-await wound care eval.  He follows at a local wound care center.    AKI: Likely hemodynamically mediated-resolved with supportive care.  Mild leukocytosis: No indication of infection-suspect secondary to stress/fracture/inflammation-WBC has normalized  Anemia: secondary to blood loss from hip fracture. Follow for now-transfuse if <7  Aortic stenosis: Severe by echocardiogram-but patient appears to be remarkably asymptomatic-apparently is very functional-and in fact worked in the yard for 2 hours a day before she broke her hip.  She seems to have tolerated surgery pretty well.  Spoke with cardiology on-call Dr. Christopher-recommendations are for outpatient follow-up-we will send epic message to Dr. Burt Knack to see if this patient can be a candidate for TAVR.  HTN: BP controlled-continue amlodipine  Depression: Stable-continue with Zoloft   Diet: Diet Order            Diet regular Room service appropriate? Yes; Fluid consistency: Thin  Diet effective now               DVT  Prophylaxis: SCD's  Code Status: Full code   Family Communication: None at bedside  Disposition Plan: Remain inpatient-SNF in the next few days  Antimicrobial agents: Anti-infectives (From admission, onward)   Start     Dose/Rate Route Frequency Ordered Stop   03/09/19 2030  vancomycin (VANCOCIN) IVPB 1000 mg/200 mL premix     1,000 mg 200 mL/hr over 60 Minutes Intravenous Every 12 hours 03/09/19 2000 03/10/19 0111   03/08/19 2300  doxycycline (VIBRA-TABS) tablet 100 mg  Status:  Discontinued     100 mg Oral Every 12 hours 03/08/19 2247 03/09/19 1041      Procedures: None  CONSULTS:  orthopedic surgery  Time spent: 25- minutes-Greater than 50% of this time was spent in counseling, explanation of diagnosis, planning of further management, and coordination of care.  MEDICATIONS: Scheduled Meds: . amLODipine  2.5 mg Oral QHS  . aspirin EC  325 mg Oral Q breakfast  . docusate sodium  100 mg Oral BID  . fentaNYL (SUBLIMAZE) injection  50 mcg Intravenous Once  . influenza vaccine adjuvanted  0.5 mL Intramuscular Tomorrow-1000  . multivitamin with minerals  1 tablet Oral Daily  . nutrition supplement (JUVEN)  1 packet Oral BID BM  . pantoprazole  40 mg Oral Daily  . sertraline  50 mg Oral Daily   Continuous Infusions: . sodium chloride 10 mL/hr at 03/10/19 1205  . methocarbamol (ROBAXIN) IV     PRN Meds:.acetaminophen, alum & mag hydroxide-simeth, hydrALAZINE, HYDROcodone-acetaminophen, HYDROcodone-acetaminophen, HYDROmorphone (DILAUDID) injection, menthol-cetylpyridinium **OR** phenol, methocarbamol **OR**  methocarbamol (ROBAXIN) IV, methocarbamol, metoCLOPramide **OR** metoCLOPramide (REGLAN) injection, morphine injection, ondansetron **OR** ondansetron (ZOFRAN) IV, oxyCODONE-acetaminophen, promethazine, senna-docusate   PHYSICAL EXAM: Vital signs: Vitals:   03/10/19 2113 03/11/19 0025 03/11/19 0047 03/11/19 0414  BP: (!) 120/55 (!) 99/52 (!) 104/57 (!) 111/54   Pulse: 80 97 92 81  Resp:      Temp: 98.4 F (36.9 C) 98.5 F (36.9 C)  98.2 F (36.8 C)  TempSrc: Oral Oral  Oral  SpO2: 90% (!) 86% 94% 99%  Weight:      Height:       Filed Weights   03/09/19 0547  Weight: 51.4 kg   Body mass index is 20.73 kg/m.   Gen Exam:Alert awake-not in any distress HEENT:atraumatic, normocephalic Chest: B/L clear to auscultation anteriorly CVS:S1S2 regular Abdomen:soft non tender, non distended Extremities:no edema Neurology: Non focal Skin: no rash  I have personally reviewed following labs and imaging studies  LABORATORY DATA: CBC: Recent Labs  Lab 03/08/19 2327 03/10/19 0458 03/11/19 0540  WBC 11.1* 12.2* 8.0  HGB 12.8 11.0* 8.6*  HCT 39.7 33.8* 26.3*  MCV 95.9 96.8 96.7  PLT 241 213 [id number]    Basic Metabolic Panel: Recent Labs  Lab 03/08/19 2327 03/10/19 0458 03/11/19 0540  NA 141 139 137  K 4.4 4.6 4.4  CL 108 104 104  CO2 21* 21* 24  GLUCOSE 106* 116* 97  BUN 20 22 35*  CREATININE 1.25* 1.02* 1.27*  CALCIUM 9.4 8.7* 8.3*    GFR: Estimated Creatinine Clearance: 25.6 mL/min (A) (by C-G formula based on SCr of 1.27 mg/dL (H)).  Liver Function Tests: No results for input(s): AST, ALT, ALKPHOS, BILITOT, PROT, ALBUMIN in the last 168 hours. No results for input(s): LIPASE, AMYLASE in the last 168 hours. No results for input(s): AMMONIA in the last 168 hours.  Coagulation Profile: Recent Labs  Lab 03/08/19 2133  INR 1.0    Cardiac Enzymes: No results for input(s): CKTOTAL, CKMB, CKMBINDEX, TROPONINI in the last 168 hours.  BNP (last 3 results) No results for input(s): PROBNP in the last 8760 hours.  HbA1C: No results for input(s): HGBA1C in the last 72 hours.  CBG: No results for input(s): GLUCAP in the last 168 hours.  Lipid Profile: No results for input(s): CHOL, HDL, LDLCALC, TRIG, CHOLHDL, LDLDIRECT in the last 72 hours.  Thyroid Function Tests: No results for input(s): TSH, T4TOTAL, FREET4, T3FREE,  THYROIDAB in the last 72 hours.  Anemia Panel: No results for input(s): VITAMINB12, FOLATE, FERRITIN, TIBC, IRON, RETICCTPCT in the last 72 hours.  Urine analysis:    Component Value Date/Time   COLORURINE YELLOW 03/14/2015 1421   APPEARANCEUR HAZY (A) 03/14/2015 1421   LABSPEC 1.010 03/14/2015 1421   PHURINE 7.0 03/14/2015 1421   GLUCOSEU NEGATIVE 03/14/2015 1421   HGBUR NEGATIVE 03/14/2015 1421   BILIRUBINUR NEGATIVE 03/14/2015 1421   KETONESUR 15 (A) 03/14/2015 1421   PROTEINUR NEGATIVE 03/14/2015 1421   UROBILINOGEN 0.2 03/14/2015 1421   NITRITE NEGATIVE 03/14/2015 1421   LEUKOCYTESUR NEGATIVE 03/14/2015 1421    Sepsis Labs: Lactic Acid, Venous No results found for: LATICACIDVEN  MICROBIOLOGY: Recent Results (from the past 240 hour(s))  SARS Coronavirus 2 [facility] order, Performed in [facility] hospital lab) Nasopharyngeal Nasopharyngeal Swab     Status: None   Collection Time: 03/08/19  9:45 PM   Specimen: Nasopharyngeal Swab  Result Value Ref Range Status   SARS Coronavirus 2 NEGATIVE NEGATIVE Final    Comment: (NOTE) If result is  NEGATIVE SARS-CoV-2 target nucleic acids are NOT DETECTED. The SARS-CoV-2 RNA is generally detectable in upper and lower  respiratory specimens during the acute phase of infection. The lowest  concentration of SARS-CoV-2 viral copies this assay can detect is 250  copies / mL. A negative result does not preclude SARS-CoV-2 infection  and should not be used as the sole basis for treatment or other  patient management decisions.  A negative result may occur with  improper specimen collection / handling, submission of specimen other  than nasopharyngeal swab, presence of viral mutation(s) within the  areas targeted by this assay, and inadequate number of viral copies  (<250 copies / mL). A negative result must be combined with clinical  observations, patient history, and epidemiological information. If result is POSITIVE SARS-CoV-2 target  nucleic acids are DETECTED. The SARS-CoV-2 RNA is generally detectable in upper and lower  respiratory specimens dur ing the acute phase of infection.  Positive  results are indicative of active infection with SARS-CoV-2.  Clinical  correlation with patient history and other diagnostic information is  necessary to determine patient infection status.  Positive results do  not rule out bacterial infection or co-infection with other viruses. If result is PRESUMPTIVE POSTIVE SARS-CoV-2 nucleic acids MAY BE PRESENT.   A presumptive positive result was obtained on the submitted specimen  and confirmed on repeat testing.  While 2019 novel coronavirus  (SARS-CoV-2) nucleic acids may be present in the submitted sample  additional confirmatory testing may be necessary for epidemiological  and / or clinical management purposes  to differentiate between  SARS-CoV-2 and other Sarbecovirus currently known to infect humans.  If clinically indicated additional testing with an alternate test  methodology [phone]) is advised. The SARS-CoV-2 RNA is generally  detectable in upper and lower respiratory sp ecimens during the acute  phase of infection. The expected result is Negative. Fact Sheet for Patients:  StrictlyIdeas.no Fact Sheet for Healthcare Providers: BankingDealers.co.za This test is not yet approved or cleared by the [place] FDA and has been authorized for detection and/or diagnosis of SARS-CoV-2 by FDA under an Emergency Use Authorization (EUA).  This EUA will remain in effect (meaning this test can be used) for the duration of the COVID-19 declaration under Section 564(b)(1) of the Act, 21 U.S.C. section 360bbb-3(b)(1), unless the authorization is terminated or revoked sooner. Performed at [facility], [place] [address] [place], [place] [address]   Culture, blood (Routine X 2) w Reflex to ID Panel     Status: None (Preliminary  result)   Collection Time: 03/08/19 11:27 PM   Specimen: BLOOD  Result Value Ref Range Status   Specimen Description BLOOD RIGHT ANTECUBITAL  Final   Special Requests   Final    BOTTLES DRAWN AEROBIC ONLY Blood Culture adequate volume   Culture   Final    NO GROWTH 2 DAYS Performed at [facility], [place] [address], [place], [place] [address]    Report Status PENDING  Incomplete  Culture, blood (Routine X 2) w Reflex to ID Panel     Status: None (Preliminary result)   Collection Time: 03/08/19 11:27 PM   Specimen: BLOOD RIGHT HAND  Result Value Ref Range Status   Specimen Description BLOOD RIGHT HAND  Final   Special Requests   Final    BOTTLES DRAWN AEROBIC ONLY Blood Culture adequate volume   Culture   Final    NO GROWTH 2 DAYS Performed at [facility], [address] [place], [place] [address]    Report Status PENDING  Incomplete  Surgical PCR screen     Status: None   Collection Time: 03/09/19 11:17 AM   Specimen: Nasal Mucosa; Nasal Swab  Result Value Ref Range Status   MRSA, PCR NEGATIVE NEGATIVE Final   Staphylococcus aureus NEGATIVE NEGATIVE Final    Comment: (NOTE) The Xpert SA Assay (FDA approved for NASAL specimens in patients 66 years of age and older), is one component of a comprehensive surveillance program. It is not intended to diagnose infection nor to guide or monitor treatment. Performed at [facility], [place] [address], [place],  [address]     RADIOLOGY STUDIES/RESULTS: Pelvis Portable  Result Date: 03/09/2019 CLINICAL DATA:  Percutaneous pinning EXAM: PORTABLE PELVIS 1-2 VIEWS COMPARISON:  03/09/2019 FINDINGS: The patient has undergone percutaneous pinning of the left hip. The alignment is stable. The hardware is intact. Expected postsurgical changes are noted. The patient is status post remote right-sided intramedullary nail placement. IMPRESSION: Satisfactory postoperative appearance following percutaneous pinning of the left  hip. No evidence of hardware complication. Electronically Signed   By: Constance Holster M.D.   On: 03/09/2019 19:07   Ct Hip Left Wo Contrast  Result Date: 03/09/2019 CLINICAL DATA:  Left hip pain after fall EXAM: CT OF THE LEFT HIP WITHOUT CONTRAST TECHNIQUE: Multidetector CT imaging of the left hip was performed according to the standard protocol. Multiplanar CT image reconstructions were also generated. COMPARISON:  X-ray 03/08/2019 FINDINGS: Bones/Joint/Cartilage Acute mildly impacted subcapital fracture of the proximal left femur. Approximately 1.4 cm of foreshortening. No involvement of the intertrochanteric aspect of the femur. Femoral head contour appears maintained without definite fracture involvement. Mild left hip joint space narrowing. No dislocation. No additional fracture is identified. There are moderate degenerative changes of the visualized left sacroiliac joint. Ligaments Suboptimally assessed by CT. Muscles and Tendons No significant muscular atrophy. Tendinous structures grossly intact. Soft tissues No large soft tissue hematoma. There is sigmoid diverticulosis without acute finding within the pelvis. IMPRESSION: Acute, mildly impacted subcapital fracture of the proximal left femur. Electronically Signed   By: Davina Poke M.D.   On: 03/09/2019 13:37   Dg C-arm 1-60 Min  Result Date: 03/09/2019 CLINICAL DATA:  Percutaneous pinning of the left hip EXAM: DG C-ARM 1-60 MIN FLUOROSCOPY TIME:  Fluoroscopy Time:  1 minutes and 29 seconds Number of Acquired Spot Images: 7 COMPARISON:  03/09/2019 FINDINGS: There are expected postsurgical changes related to left hip percutaneous pinning. The hardware is intact. There is no unexpected radiopaque foreign body. IMPRESSION: Status post left hip percutaneous pinning. No unexpected radiopaque foreign body. Electronically Signed   By: Constance Holster M.D.   On: 03/09/2019 19:07   Dg Hip Operative Unilat W Or W/o Pelvis Left  Result Date:  03/09/2019 CLINICAL DATA:  Hip pending EXAM: OPERATIVE LEFT HIP (WITH PELVIS IF PERFORMED) 7 VIEWS TECHNIQUE: Fluoroscopic spot image(s) were submitted for interpretation post-operatively. COMPARISON:  03/08/2019 FINDINGS: The patient has undergone percutaneous pinning of the left hip. The alignment is stable. The hardware appears intact. There is no unexpected radiopaque foreign body. Expected postsurgical changes are noted. IMPRESSION: Status post percutaneous pinning of the left hip. No evidence of hardware complication or unexpected radiopaque foreign body. Electronically Signed   By: Constance Holster M.D.   On: 03/09/2019 19:06     LOS: 3 days   Oren Binet, MD  [facility]  If 7PM-7AM, please contact night-coverage  Please page via [email or web address]  Go to [email or web address] and use [facility] universal password to access. If you do not have the password, please contact the hospital operator.  Locate the [facility] provider you are looking for under [facility] and page to a number that you can be directly reached. If you still have difficulty reaching the provider, please page the [facility] (Director on Call) for the Hospitalists listed on amion for assistance.  03/11/2019, 9:17 AM

## 2019-03-11 NOTE — TOC Progression Note (Signed)
Transition of Care [facility]) - Progression Note    Patient Details  Name: [name] MRN: [id number] Date of Birth: [birth year]  Transition of [facility]) CM/SW [employer], [place] Phone Number: 03/11/2019, 1:35 PM  Clinical Narrative:     CSW visit with the patient at bedside. Patien's daughter was also present in the room. CSW introduced self and explained role. CSW discussed PT recommendation of ST rehab at SNF before going home. Patient states she lives home alone and is agreeable to SNF placement. Patient and daughter states preference is [facility] or a SNF in the [place] area but will consider a SNF in [place] if needed. Patient and family states no questions or concerns at this time.   CSW sent referral to [place] and left voice message with the admission coordinator to contact CSW.  Thurmond Butts, MSW, [facility] Clinical Social Worker [phone]   Expected Discharge Plan: Skilled Nursing Facility Barriers to Discharge: SNF Pending bed offer  Expected Discharge Plan and Services Expected Discharge Plan: [place] arrangements for the past 2 months: Single Family Home                                       Social Determinants of Health (SDOH) Interventions    Readmission Risk Interventions No flowsheet data found.

## 2019-03-11 NOTE — Plan of Care (Signed)

## 2019-03-11 NOTE — Progress Notes (Signed)
OT Treatment Session  Returned to complete ADL session. Received clarification regarding LUE ROM per Dr. Ninfa Linden - elbow ROM OK within pain tolerance; no shoulder ROM. OK to use RW with platform on L if pt tolerates. Pt completed ADL with min A for UB and mod ah LB bathing and Max A for hygiene after toileting. Daughter present for session and asked several questions regarding management of LUE - recommended she discuss her concerns with Dr. Ninfa Linden. Pt very appreciative. Will continue to follow acutely.     03/11/19 1200  OT Visit Information  Last OT Received On 03/11/19  Assistance Needed +2 (for mobility beyond sit - stand)  History of Present Illness 85 yo female with onset of fall at home in yard was admitted for her L femoral neck fracture with ORIF done on 9/18.  Has L humeral fracture with sling in place and NWB, WBAT on L hip.  Has leukocytosis, AKI.  PMHx:  osteoporosis, RA, stress incontinence, depression, HTN, falls, R hip fracture with ORIF, R leg wound chronic, falls, cardiac history   Precautions  Precautions Fall  Precaution Comments WBAT L hip and NWB L UE  Pain Assessment  Pain Assessment Faces  Faces Pain Scale 4  Pain Location L hip and UE  Pain Descriptors / Indicators Operative site guarding;Discomfort;Grimacing;Guarding;Aching  Pain Intervention(s) Limited activity within patient's tolerance;Repositioned;Ice applied (ice to L shoulder)  Cognition  Arousal/Alertness Awake/alert  Behavior During Therapy WFL for tasks assessed/performed  Overall Cognitive Status Within Functional Limits for tasks assessed  Upper Extremity Assessment  Upper Extremity Assessment LUE deficits/detail  ADL  Overall ADL's  Needs assistance/impaired  Grooming Details (indicate cue type and reason) Pt with rollo on deoderant. Applied to therapist hand to rub in armpit - demosntrated to pt so she could learn how to tdo this independently  Upper Body Bathing Minimal assistance  Upper Body  Bathing Details (indicate cue type and reason) used "flossing" technique to clean under L armpit as no shoulder ROM allowed.   Upper Body Dressing Details (indicate cue type and reason) Began educating daughter on donning technique for sling  Functional mobility during ADLs Moderate assistance  General ADL Comments Pt's pad soiled. Assited pt with sit - stand to clean bottom. Max A for pericare. Pt able to push up from chair with RUE, then holdingont therapist while tech cleaned her bottom. Pt able to clean front periarea. Ablet o reach just beyond knees comfortably for LB bathing. Would most likley benefit from use of AE. Educated pt on imortance on using L hand to manipulate caps/items for ADL. Daughter present for ADL session and educated on how to assist pt.   Bed Mobility  General bed mobility comments OOB in chair  Balance  Overall balance assessment Needs assistance;History of Falls  Sitting-balance support Feet supported  Standing balance support Single extremity supported;During functional activity  Standing balance-Leahy Scale Poor  Restrictions  Weight Bearing Restrictions Yes  LUE Weight Bearing NWB  LLE Weight Bearing WBAT  Transfers  Overall transfer level Needs assistance  Equipment used 1 person hand held assist;Hemi-walker  Transfers Sit to/from Stand  Sit to Stand Mod assist  OT - End of Session  Equipment Utilized During Treatment Gait belt  Activity Tolerance Patient tolerated treatment well  Patient left in chair;with call bell/phone within reach;with family/visitor present  Nurse Communication Mobility status  OT Assessment/Plan  OT Plan Discharge plan remains appropriate  OT Visit Diagnosis Unsteadiness on feet (R26.81);Other abnormalities of gait and mobility (  R26.89);Muscle weakness (generalized) (M62.81);Pain  Pain - Right/Left Left  Pain - part of body Arm;Hip  OT Frequency (ACUTE ONLY) Min 2X/week  Follow Up Recommendations SNF;Supervision/Assistance - 24  hour  OT Equipment 3 in 1 bedside commode;Other (comment)  AM-PAC OT "6 Clicks" Daily Activity Outcome Measure (Version 2)  Help from another person eating meals? 3  Help from another person taking care of personal grooming? 3  Help from another person toileting, which includes using toliet, bedpan, or urinal? 2  Help from another person bathing (including washing, rinsing, drying)? 2  Help from another person to put on and taking off regular upper body clothing? 2  Help from another person to put on and taking off regular lower body clothing? 2  6 Click Score 14  OT Goal Progression  Progress towards OT goals Progressing toward goals  Acute Rehab OT Goals  Patient Stated Goal to be independent   OT Goal Formulation With patient  Time For Goal Achievement 03/25/19  Potential to Achieve Goals Good  OT Time Calculation  OT Start Time (ACUTE ONLY) 1015  OT Stop Time (ACUTE ONLY) 1054  OT Time Calculation (min) 39 min  OT General Charges  $OT Visit 1 Visit  OT Treatments  $Self Care/Home Management  38-52 mins  Maurie Boettcher, OT/L   Acute OT Clinical Specialist [place] Pager [phone] Office [phone]

## 2019-03-11 NOTE — Consult Note (Signed)
[facility] Nurse wound consult note Reason for Consult:Right pretibial full thickness ulceration of approximately 1 year duration.  Was recently referred to outpatient [facility] for HBOT (has not yet been treated in that facility) Wound type:Traumatic Pressure Injury POA: N/A Measurement:2.5cnm x 2cm x 0.1cm  Wound bed:red, friable Drainage (amount, consistency, odor) scant serous Periwound: intact Dressing procedure/placement/frequency: I will continue topical care using a twice daily application of an antimicrobial nonadherent (xeroform) gauze topped with a dry gauze dressing and secured with an atraumatic securing mechanism. Here in house we will use silicone foam, if that is unavailable, may use a few turns of a kerlix roll gauze dressing and paper tape to secure. A pressure redistribution chair cushion is provided for her use when OOB to chair.  [facility] nursing team will not follow, but will remain available to this patient, the nursing and medical teams.  Please re-consult if needed. Thanks, Maudie Flakes, MSN, RN, [place], Ar[facility]  Pager# [phone]

## 2019-03-11 NOTE — Discharge Instructions (Signed)
Needs to wear a sling on the left arm. No motion of the left shoulder, but can move the left elbow, wrist and hand. Can attempt weight-bearing on the left hip, but keep it minimal. Can get hip dressing wet.

## 2019-03-11 NOTE — Progress Notes (Signed)
Occupational Therapy Evaluation Patient Details Name: Michaela Simpson MRN: [id number] DOB: 24-Jan-1932 Today's Date: 03/11/2019    History of Present Illness 85 yo female with onset of fall at home in yard was admitted for her L femoral neck fracture with ORIF done on 9/18.  Has L humeral fracture with sling in place and NWB, WBAT on L hip.  Has leukocytosis, AKI.  PMHx:  osteoporosis, RA, stress incontinence, depression, HTN, falls, R hip fracture with ORIF, R leg wound chronic, falls, cardiac history    Clinical Impression   PTA, pt lived alone and was independent with mobility and ADL; drove and completed IADL tasks. Pt mod A with sit - stand transfer with +1HHA and Mod to Max A with ADL tasks. Pt will need rehab at SNF. ASked for clarification orders for ROM LUE. Will follow acutely.     Follow Up Recommendations  SNF;Supervision/Assistance - 24 hour    Equipment Recommendations  3 in 1 bedside commode;Other (comment)    Recommendations for Other Services       Precautions / Restrictions Precautions Precautions: Fall Precaution Comments: WBAT L hip and NWB L UE(per Blackman's progress note, can try platform RW) Restrictions Weight Bearing Restrictions: Yes LUE Weight Bearing: Non weight bearing LLE Weight Bearing: Weight bearing as tolerated      Mobility Bed Mobility               General bed mobility comments: OOB in chair  Transfers Overall transfer level: Needs assistance Equipment used: 1 person hand held assist;Hemi-walker Transfers: Sit to/from Stand Sit to Stand: Mod assist              Balance Overall balance assessment: Needs assistance;History of Falls Sitting-balance support: Feet supported Sitting balance-Leahy Scale: Fair     Standing balance support: Single extremity supported;During functional activity Standing balance-Leahy Scale: Poor                             ADL either performed or assessed with clinical  judgement   ADL Overall ADL's : Needs assistance/impaired Eating/Feeding: Set up;Sitting   Grooming: Minimal assistance;Sitting   Upper Body Bathing: Minimal assistance;Sitting   Lower Body Bathing: Moderate assistance;Sit to/from stand   Upper Body Dressing : Maximal assistance;Sitting   Lower Body Dressing: Maximal assistance;Sit to/from stand   Toilet Transfer: Maximal assistance;Stand-pivot   Toileting- Clothing Manipulation and Hygiene: Maximal assistance Toileting - Clothing Manipulation Details (indicate cue type and reason): using purewick     Functional mobility during ADLs: Moderate assistance(sit - stnad)       Vision Baseline Vision/History: Wears glasses       Perception     Praxis      Pertinent Vitals/Pain Pain Assessment: 0-10 Faces Pain Scale: Hurts little more Pain Location: L hip and UE Pain Descriptors / Indicators: Operative site guarding;Discomfort;Grimacing;Guarding;Aching Pain Intervention(s): Limited activity within patient's tolerance;Repositioned;Ice applied     Hand Dominance Right   Extremity/Trunk Assessment Upper Extremity Assessment Upper Extremity Assessment: LUE deficits/detail LUE Deficits / Details: in sling with humeral fracture; hand/wrist ROM WFL; will nneed clarification regardjng elbow ROM - sent secure chat LUE Coordination: decreased fine motor;decreased gross motor   Lower Extremity Assessment LLE Deficits / Details: pain with mobility due to L hip pinning LLE: Unable to fully assess due to pain LLE Coordination: decreased fine motor;decreased gross motor   Cervical / Trunk Assessment Cervical / Trunk Assessment: Kyphotic   Communication Communication Communication:  No difficulties   Cognition Arousal/Alertness: Awake/alert Behavior During Therapy: WFL for tasks assessed/performed Overall Cognitive Status: Within Functional Limits for tasks assessed                                     General  Comments       Exercises Exercises: (LE strength on RLE is 4 to 4+ and LLE is 3 to 3+) Other Exercises Other Exercises: encouraged use of incentive spirometer Other Exercises: L hand/wrist ROM Other Exercises: RUE AROM through full range Other Exercises: weight shifting side/side in chairfor pressure relief   Shoulder Instructions      Home Living Family/patient expects to be discharged to:: Skilled nursing facility Living Arrangements: Alone Available Help at Discharge: Family;Friend(s);Available PRN/intermittently Type of Home: House                 Bathroom Toilet: Standard     Home Equipment: Environmental consultant - 2 wheels;Cane - single point          Prior Functioning/Environment Level of Independence: Independent with assistive device(s)        Comments: SPC was main device        OT Problem List: Decreased strength;Decreased range of motion;Decreased activity tolerance;Impaired balance (sitting and/or standing);Decreased coordination;Decreased safety awareness;Decreased knowledge of use of DME or AE;Decreased knowledge of precautions;Impaired UE functional use;Pain;Increased edema      OT Treatment/Interventions: Self-care/ADL training;Therapeutic exercise;Neuromuscular education;DME and/or AE instruction;Therapeutic activities;Patient/family education;Balance training    OT Goals(Current goals can be found in the care plan section) Acute Rehab OT Goals Patient Stated Goal: to be independent  OT Goal Formulation: With patient Time For Goal Achievement: 03/25/19 Potential to Achieve Goals: Good  OT Frequency: Min 2X/week   Barriers to D/C:            Co-evaluation              AM-PAC OT "6 Clicks" Daily Activity     Outcome Measure Help from another person eating meals?: A Little Help from another person taking care of personal grooming?: A Little Help from another person toileting, which includes using toliet, bedpan, or urinal?: A Lot Help from  another person bathing (including washing, rinsing, drying)?: A Lot Help from another person to put on and taking off regular upper body clothing?: A Lot Help from another person to put on and taking off regular lower body clothing?: A Lot 6 Click Score: 14   End of Session Equipment Utilized During Treatment: Gait belt Nurse Communication: Mobility status;Precautions;Weight bearing status  Activity Tolerance: Patient tolerated treatment well Patient left: in chair;with call bell/phone within reach;with family/visitor present  OT Visit Diagnosis: Unsteadiness on feet (R26.81);Other abnormalities of gait and mobility (R26.89);Muscle weakness (generalized) (M62.81);Pain Pain - Right/Left: Left Pain - part of body: Arm;Hip                Time: 0940-1000 OT Time Calculation (min): 20 min Charges:  OT General Charges $OT Visit: 1 Visit OT Evaluation $OT Eval Moderate Complexity: [place], OT/L   Acute OT Clinical Specialist Acute Rehabilitation Services Pager [phone] Office [phone]   [facility] 03/11/2019, 12:04 PM

## 2019-03-11 NOTE — Progress Notes (Signed)
Subjective: 2 Days Post-Op Procedure(s) (LRB): CANNULATED HIP PINNING (Left) Patient reports pain as moderate.  She states she is ok as long as she does not move too much.  Therapy is recommending short-term skilled nursing (SNF) following her acute hospital stay.  Does have acute blood loss anemia from her injuries and surgery.  Objective: Vital signs in last 24 hours: Temp:  [98.2 F (36.8 C)-99.5 F (37.5 C)] 98.2 F (36.8 C) (09/20 0414) Pulse Rate:  [80-97] 81 (09/20 0414) Resp:  [14] 14 (09/19 1547) BP: (99-120)/(52-65) 111/54 (09/20 0414) SpO2:  [86 %-99 %] 99 % (09/20 0414)  Intake/Output from previous day: 09/19 0701 - 09/20 0700 In: 1207.3 [P.O.:120; I.V.:1087.3] Out: 400 [Urine:400] Intake/Output this shift: No intake/output data recorded.  Recent Labs    03/08/19 2327 03/10/19 0458 03/11/19 0540  HGB 12.8 11.0* 8.6*   Recent Labs    03/10/19 0458 03/11/19 0540  WBC 12.2* 8.0  RBC 3.49* 2.72*  HCT 33.8* 26.3*  PLT 213 191   Recent Labs    03/10/19 0458 03/11/19 0540  NA 139 137  K 4.6 4.4  CL 104 104  CO2 21* 24  BUN 22 35*  CREATININE 1.02* 1.27*  GLUCOSE 116* 97  CALCIUM 8.7* 8.3*   Recent Labs    03/08/19 2133  INR 1.0    Sensation intact distally Intact pulses distally Dorsiflexion/Plantar flexion intact Incision: dressing C/D/I  Left shoulder located.  Swollen. Left hand well perfused  Assessment/Plan: 2 Days Post-Op Procedure(s) (LRB): CANNULATED HIP PINNING (Left) Continue attempts to mobilize, but let pain be the guide. Social Work consult for short-term skilled nursing placement.      Mcarthur Rossetti 03/11/2019, 8:44 AM

## 2019-03-11 NOTE — Progress Notes (Signed)
Pt c/o non radiating chest pain. BP 104/57, HR 92, 94 spO2 on 2L. Pt NSR on EKG. Morphine 0.5 mg IV given with relief. TRH on call made aware. Will continue to monitor. Pt resting comfortably as of now.

## 2019-03-11 NOTE — NC FL2 (Signed)
[place] LEVEL OF CARE SCREENING TOOL     IDENTIFICATION  Patient Name: Michaela Simpson Birthdate: January 27, 1932 Sex: female Admission Date (Current Location): 03/08/2019  [facility] and [place] Number:  Herbalist and Address:  [facility], [place] [address], [place], [place] [address]      Provider Number: [id number]  Attending Physician Name and Address:  Jonetta Osgood, MD  Relative Name and Phone Number:  [name] [phone]    Current Level of Care: Hospital Recommended Level of Care: [employer] Prior Approval Number:    Date Approved/Denied:   PASRR Number: [id number]  Discharge Plan: SNF    Current Diagnoses: Patient Active Problem List   Diagnosis Date Noted  . Closed left humeral fracture 03/08/2019  . Wound of right leg, initial encounter 03/08/2019  . Leukocytosis 03/08/2019  . Fracture of femoral neck, left, closed ([place]) 03/08/2019  . AKI (acute kidney injury) ([place]) 03/08/2019  . Depression   . HTN (hypertension)   . Rash and nonspecific skin eruption 04/06/2015  . Acute blood loss anemia 03/22/2015  . Edema 03/21/2015  . Acute renal failure syndrome ([place])   . Closed right hip fracture ([place]) 03/14/2015  . Rheumatoid arthritis ([place]) 03/14/2015  . Fall 03/14/2015    Orientation RESPIRATION BLADDER Height & Weight     Self, Time, Situation, Place  Normal Continent Weight: 113 lb 5.1 oz (51.4 kg) Height:  5\' 2"  (157.5 cm)  BEHAVIORAL SYMPTOMS/MOOD NEUROLOGICAL BOWEL NUTRITION STATUS      Continent Diet(please see discharge)  AMBULATORY STATUS COMMUNICATION OF NEEDS Skin     Verbally Surgical wounds(incision(closed)hip,left, wound laceration leg,left,lower, wound leg right lateral aspect of superior wound)                       Personal Care Assistance Level of Assistance  Bathing, Feeding, Dressing Bathing Assistance: Limited assistance Feeding assistance:  Independent Dressing Assistance: Limited assistance     Functional Limitations Info  Sight, Hearing, Speech Sight Info: Adequate Hearing Info: Adequate Speech Info: Adequate    SPECIAL CARE FACTORS FREQUENCY  PT (By licensed PT), OT (By licensed OT)     PT Frequency: 5x per week OT Frequency: 5x per week            Contractures Contractures Info: Not present    Additional Factors Info  Code Status, Allergies Code Status Info: Partial Allergies Info: Bee Pollen,Penicillins,Codeine           Current Medications (03/11/2019):  This is the current hospital active medication list Current Facility-Administered Medications  Medication Dose Route Frequency Provider Last Rate Last Dose  . 0.9 %  sodium chloride infusion   Intravenous Continuous Jonetta Osgood, MD 10 mL/hr at 03/10/19 1205    . acetaminophen (TYLENOL) tablet 325-650 mg  325-650 mg Oral Q6H PRN Mcarthur Rossetti, MD      . alum & mag hydroxide-simeth (MAALOX/MYLANTA) 200-200-20 MG/5ML suspension 30 mL  30 mL Oral Q4H PRN Mcarthur Rossetti, MD      . amLODipine (NORVASC) tablet 2.5 mg  2.5 mg Oral QHS Mcarthur Rossetti, MD   2.5 mg at 03/10/19 2029  . aspirin EC tablet 325 mg  325 mg Oral Q breakfast Mcarthur Rossetti, MD   325 mg at 03/11/19 0805  . docusate sodium (COLACE) capsule 100 mg  100 mg Oral BID Mcarthur Rossetti, MD   100 mg at 03/11/19  0805  . fentaNYL (SUBLIMAZE) injection 50 mcg  50 mcg Intravenous Once Mcarthur Rossetti, MD      . hydrALAZINE (APRESOLINE) injection 5 mg  5 mg Intravenous Q2H PRN Mcarthur Rossetti, MD      . HYDROcodone-acetaminophen [facility]) 7.5-325 MG per tablet 1-2 tablet  1-2 tablet Oral Q4H PRN Mcarthur Rossetti, MD      . HYDROcodone-acetaminophen (NORCO/VICODIN) 5-325 MG per tablet 1-2 tablet  1-2 tablet Oral Q4H PRN Mcarthur Rossetti, MD   1 tablet at 03/11/19 0805  . HYDROmorphone (DILAUDID) injection 0.5 mg  0.5 mg  Intravenous Q4H PRN Mcarthur Rossetti, MD      . influenza vaccine adjuvanted (FLUAD) injection 0.5 mL  0.5 mL Intramuscular Tomorrow-1000 Mcarthur Rossetti, MD      . menthol-cetylpyridinium (CEPACOL) lozenge 3 mg  1 lozenge Oral PRN Mcarthur Rossetti, MD       Or  . phenol (CHLORASEPTIC) mouth spray 1 spray  1 spray Mouth/Throat PRN Mcarthur Rossetti, MD      . methocarbamol (ROBAXIN) tablet 500 mg  500 mg Oral Q6H PRN Mcarthur Rossetti, MD   500 mg at 03/11/19 0805   Or  . methocarbamol (ROBAXIN) 500 mg in dextrose 5 % 50 mL IVPB  500 mg Intravenous Q6H PRN Mcarthur Rossetti, MD      . methocarbamol (ROBAXIN) tablet 500 mg  500 mg Oral Q8H PRN Mcarthur Rossetti, MD   500 mg at 03/10/19 1454  . metoCLOPramide (REGLAN) tablet 5-10 mg  5-10 mg Oral Q8H PRN Mcarthur Rossetti, MD       Or  . metoCLOPramide (REGLAN) injection 5-10 mg  5-10 mg Intravenous Q8H PRN Mcarthur Rossetti, MD      . morphine 2 MG/ML injection 0.5-1 mg  0.5-1 mg Intravenous Q2H PRN Mcarthur Rossetti, MD   0.5 mg at 03/11/19 0045  . multivitamin with minerals tablet 1 tablet  1 tablet Oral Daily Mcarthur Rossetti, MD   1 tablet at 03/11/19 1247  . nutrition supplement (JUVEN) (JUVEN) powder packet 1 packet  1 packet Oral BID BM Mcarthur Rossetti, MD   1 packet at 03/11/19 1247  . ondansetron (ZOFRAN) tablet 4 mg  4 mg Oral Q6H PRN Mcarthur Rossetti, MD       Or  . ondansetron [facility]) injection 4 mg  4 mg Intravenous Q6H PRN Mcarthur Rossetti, MD      . oxyCODONE-acetaminophen (PERCOCET/ROXICET) 5-325 MG per tablet 1 tablet  1 tablet Oral Q4H PRN Mcarthur Rossetti, MD      . pantoprazole (PROTONIX) EC tablet 40 mg  40 mg Oral Daily Mcarthur Rossetti, MD   40 mg at 03/11/19 0806  . promethazine (PHENERGAN) injection 12.5 mg  12.5 mg Intravenous Q6H PRN Mcarthur Rossetti, MD      . senna-docusate (Senokot-S) tablet 1 tablet  1  tablet Oral QHS PRN Mcarthur Rossetti, MD      . sertraline (ZOLOFT) tablet 50 mg  50 mg Oral Daily Mcarthur Rossetti, MD   50 mg at 03/11/19 0805     Discharge Medications: Please see discharge summary for a list of discharge medications.  Relevant Imaging Results:  Relevant Lab Results:   Additional Information SSN  [place] [address] [place], [place]

## 2019-03-12 ENCOUNTER — Non-Acute Institutional Stay (SKILLED_NURSING_FACILITY): Payer: Medicare Other | Admitting: Adult Health

## 2019-03-12 ENCOUNTER — Ambulatory Visit (HOSPITAL_COMMUNITY): Payer: Medicare Other | Admitting: Physical Therapy

## 2019-03-12 ENCOUNTER — Encounter: Payer: Self-pay | Admitting: Adult Health

## 2019-03-12 ENCOUNTER — Other Ambulatory Visit: Payer: Self-pay | Admitting: Adult Health

## 2019-03-12 ENCOUNTER — Inpatient Hospital Stay
Admission: RE | Admit: 2019-03-12 | Discharge: 2019-04-07 | Disposition: A | Discharge status: Home / Self Care | Payer: Medicare Other | Source: Ambulatory Visit | Attending: Internal Medicine | Admitting: Internal Medicine

## 2019-03-12 DIAGNOSIS — S81801D Unspecified open wound, right lower leg, subsequent encounter: Secondary | ICD-10-CM | POA: Diagnosis not present

## 2019-03-12 DIAGNOSIS — Y92017 Garden or yard in single-family (private) house as the place of occurrence of the external cause: Secondary | ICD-10-CM | POA: Diagnosis not present

## 2019-03-12 DIAGNOSIS — S72002S Fracture of unspecified part of neck of left femur, sequela: Secondary | ICD-10-CM | POA: Diagnosis not present

## 2019-03-12 DIAGNOSIS — S72002A Fracture of unspecified part of neck of left femur, initial encounter for closed fracture: Secondary | ICD-10-CM | POA: Diagnosis not present

## 2019-03-12 DIAGNOSIS — D62 Acute posthemorrhagic anemia: Secondary | ICD-10-CM

## 2019-03-12 DIAGNOSIS — Y93H2 Activity, gardening and landscaping: Secondary | ICD-10-CM | POA: Diagnosis not present

## 2019-03-12 DIAGNOSIS — Z741 Need for assistance with personal care: Secondary | ICD-10-CM | POA: Diagnosis not present

## 2019-03-12 DIAGNOSIS — M810 Age-related osteoporosis without current pathological fracture: Secondary | ICD-10-CM | POA: Diagnosis not present

## 2019-03-12 DIAGNOSIS — S42302D Unspecified fracture of shaft of humerus, left arm, subsequent encounter for fracture with routine healing: Secondary | ICD-10-CM | POA: Diagnosis not present

## 2019-03-12 DIAGNOSIS — M79661 Pain in right lower leg: Secondary | ICD-10-CM | POA: Diagnosis not present

## 2019-03-12 DIAGNOSIS — S42295S Other nondisplaced fracture of upper end of left humerus, sequela: Secondary | ICD-10-CM | POA: Diagnosis not present

## 2019-03-12 DIAGNOSIS — F329 Major depressive disorder, single episode, unspecified: Secondary | ICD-10-CM | POA: Diagnosis not present

## 2019-03-12 DIAGNOSIS — S81801S Unspecified open wound, right lower leg, sequela: Secondary | ICD-10-CM | POA: Diagnosis not present

## 2019-03-12 DIAGNOSIS — M6281 Muscle weakness (generalized): Secondary | ICD-10-CM | POA: Diagnosis not present

## 2019-03-12 DIAGNOSIS — R262 Difficulty in walking, not elsewhere classified: Secondary | ICD-10-CM | POA: Diagnosis not present

## 2019-03-12 DIAGNOSIS — M069 Rheumatoid arthritis, unspecified: Secondary | ICD-10-CM | POA: Diagnosis not present

## 2019-03-12 DIAGNOSIS — M1990 Unspecified osteoarthritis, unspecified site: Secondary | ICD-10-CM | POA: Diagnosis not present

## 2019-03-12 DIAGNOSIS — S72002D Fracture of unspecified part of neck of left femur, subsequent encounter for closed fracture with routine healing: Secondary | ICD-10-CM | POA: Diagnosis not present

## 2019-03-12 DIAGNOSIS — M79604 Pain in right leg: Secondary | ICD-10-CM | POA: Diagnosis not present

## 2019-03-12 DIAGNOSIS — I743 Embolism and thrombosis of arteries of the lower extremities: Secondary | ICD-10-CM | POA: Diagnosis not present

## 2019-03-12 DIAGNOSIS — Z9181 History of falling: Secondary | ICD-10-CM | POA: Diagnosis not present

## 2019-03-12 DIAGNOSIS — K5909 Other constipation: Secondary | ICD-10-CM

## 2019-03-12 DIAGNOSIS — N393 Stress incontinence (female) (male): Secondary | ICD-10-CM | POA: Diagnosis not present

## 2019-03-12 DIAGNOSIS — S81831D Puncture wound without foreign body, right lower leg, subsequent encounter: Secondary | ICD-10-CM | POA: Diagnosis not present

## 2019-03-12 DIAGNOSIS — S42292D Other displaced fracture of upper end of left humerus, subsequent encounter for fracture with routine healing: Secondary | ICD-10-CM | POA: Diagnosis not present

## 2019-03-12 DIAGNOSIS — N179 Acute kidney failure, unspecified: Secondary | ICD-10-CM | POA: Diagnosis not present

## 2019-03-12 DIAGNOSIS — Z23 Encounter for immunization: Secondary | ICD-10-CM | POA: Diagnosis not present

## 2019-03-12 DIAGNOSIS — F320 Major depressive disorder, single episode, mild: Secondary | ICD-10-CM | POA: Diagnosis not present

## 2019-03-12 DIAGNOSIS — M2550 Pain in unspecified joint: Secondary | ICD-10-CM | POA: Diagnosis not present

## 2019-03-12 DIAGNOSIS — F339 Major depressive disorder, recurrent, unspecified: Secondary | ICD-10-CM | POA: Diagnosis not present

## 2019-03-12 DIAGNOSIS — R52 Pain, unspecified: Secondary | ICD-10-CM | POA: Diagnosis not present

## 2019-03-12 DIAGNOSIS — R600 Localized edema: Secondary | ICD-10-CM | POA: Diagnosis not present

## 2019-03-12 DIAGNOSIS — D72829 Elevated white blood cell count, unspecified: Secondary | ICD-10-CM | POA: Diagnosis not present

## 2019-03-12 DIAGNOSIS — M7989 Other specified soft tissue disorders: Secondary | ICD-10-CM | POA: Diagnosis not present

## 2019-03-12 DIAGNOSIS — Z7401 Bed confinement status: Secondary | ICD-10-CM | POA: Diagnosis not present

## 2019-03-12 DIAGNOSIS — W19XXXA Unspecified fall, initial encounter: Secondary | ICD-10-CM | POA: Diagnosis not present

## 2019-03-12 DIAGNOSIS — W1830XA Fall on same level, unspecified, initial encounter: Secondary | ICD-10-CM | POA: Diagnosis not present

## 2019-03-12 DIAGNOSIS — S81801A Unspecified open wound, right lower leg, initial encounter: Secondary | ICD-10-CM | POA: Diagnosis not present

## 2019-03-12 DIAGNOSIS — Z4789 Encounter for other orthopedic aftercare: Secondary | ICD-10-CM | POA: Diagnosis not present

## 2019-03-12 DIAGNOSIS — S42295A Other nondisplaced fracture of upper end of left humerus, initial encounter for closed fracture: Secondary | ICD-10-CM | POA: Diagnosis not present

## 2019-03-12 DIAGNOSIS — I10 Essential (primary) hypertension: Secondary | ICD-10-CM | POA: Diagnosis not present

## 2019-03-12 DIAGNOSIS — M25512 Pain in left shoulder: Secondary | ICD-10-CM | POA: Diagnosis not present

## 2019-03-12 DIAGNOSIS — Z79899 Other long term (current) drug therapy: Secondary | ICD-10-CM | POA: Diagnosis not present

## 2019-03-12 LAB — BASIC METABOLIC PANEL
Anion gap: 8 (ref 5–15)
BUN: 35 mg/dL — ABNORMAL HIGH (ref 8–23)
CO2: 26 mmol/L (ref 22–32)
Calcium: 8.9 mg/dL (ref 8.9–10.3)
Chloride: 106 mmol/L (ref 98–111)
Creatinine, Ser: 1.14 mg/dL — ABNORMAL HIGH (ref 0.44–1.00)
GFR calc Af Amer: 51 mL/min — ABNORMAL LOW (ref >60)
GFR calc non Af Amer: 44 mL/min — ABNORMAL LOW (ref >60)
Glucose, Bld: 95 mg/dL (ref 70–99)
Potassium: 4.1 mmol/L (ref 3.5–5.1)
Sodium: 140 mmol/L (ref 135–145)

## 2019-03-12 LAB — CBC
HCT: 26.4 % — ABNORMAL LOW (ref 36.0–46.0)
Hemoglobin: 8.7 g/dL — ABNORMAL LOW (ref 12.0–15.0)
MCH: 31.6 pg (ref 26.0–34.0)
MCHC: 33 g/dL (ref 30.0–36.0)
MCV: 96 fL (ref 80.0–100.0)
Platelets: 202 10*3/uL (ref 150–400)
RBC: 2.75 MIL/uL — ABNORMAL LOW (ref 3.87–5.11)
RDW: 13.7 % (ref 11.5–15.5)
WBC: 6.7 10*3/uL (ref 4.0–10.5)
nRBC: 0 % (ref 0.0–0.2)

## 2019-03-12 MED ORDER — OXYCODONE-ACETAMINOPHEN 5-325 MG PO TABS: 1.0000 | ORAL_TABLET | Freq: Four times a day (QID) | ORAL | 0 refills | Status: DC

## 2019-03-12 MED ORDER — OXYCODONE-ACETAMINOPHEN 5-325 MG PO TABS: 1.0000 | ORAL_TABLET | Freq: Four times a day (QID) | ORAL | 0 refills | Status: DC | PRN

## 2019-03-12 MED ORDER — SENNOSIDES-DOCUSATE SODIUM 8.6-50 MG PO TABS: 1.0000 | ORAL_TABLET | Freq: Every evening | ORAL | Status: DC | PRN

## 2019-03-12 MED ORDER — JUVEN PO PACK: 1.0000 | PACK | Freq: Two times a day (BID) | ORAL | 0 refills | Status: DC

## 2019-03-12 NOTE — Progress Notes (Signed)
Discharge packet printed, report given to [facility], patient going to room 158.  [facility] transporting patient to facility.  Patient's spouse at bedside at time of transfer.  All belongings bagged and taken by family.  IV's removed prior to d/c.

## 2019-03-12 NOTE — Progress Notes (Signed)
Location:    [place] Room Number: 158/P Place of Service:  SNF (31)   CODE STATUS: Full Code  Allergies  Allergen Reactions  . Bee Pollen   . Penicillins Other (See Comments)    Child hood allergy  . Codeine Nausea And Vomiting and Rash    Chief Complaint  Patient presents with  . Hospitalization Follow-up    Hospitalization Follow Up    HPI:  She is a 85 year old woman who has been hospitalized from 03-08-19 through 03-12-19. She had a mechanical fall going into a wound clinic. She suffered a left hip and left humeral fracture. She had a left hip nail done on 03-09-19. Her left humerus is being nonoperatively and is currently in a sling. She is here for short term rehab. She is having left hip and left arm pain which is much worse with any movement. She is having constipation. She denies any changes in her appetite. No anxiety. Her goal is to return home. She will continue to be followed for her chronic illnesses including: hypertension; RA constipation.   Past Medical History:  Diagnosis Date  . Depression   . HTN (hypertension)   . Osteoporosis   . Rheumatoid arthritis [facility])     Past Surgical History:  Procedure Laterality Date  . HIP PINNING,CANNULATED Left 03/09/2019   Procedure: CANNULATED HIP PINNING;  Surgeon: Mcarthur Rossetti, MD;  Location: [place];  Service: Orthopedics;  Laterality: Left;  . INTRAMEDULLARY (IM) NAIL INTERTROCHANTERIC Right 03/14/2015   Procedure: RIGHT INTERTROCHANTRIC INTRAMEDULLARY (IM) NAIL ;  Surgeon: Mcarthur Rossetti, MD;  Location: [place];  Service: Orthopedics;  Laterality: Right;    Social History   Socioeconomic History  . Marital status: Widowed    Spouse name: Not on file  . Number of children: Not on file  . Years of education: Not on file  . Highest education level: Not on file  Occupational History  . Not on file  Social Needs  . Financial resource strain: Not hard at all  . Food insecurity     Worry: Never true    Inability: Never true  . Transportation needs    Medical: No    Non-medical: No  Tobacco Use  . Smoking status: Never Smoker  . Smokeless tobacco: Never Used  Substance and Sexual Activity  . Alcohol use: No  . Drug use: No  . Sexual activity: Not on file  Lifestyle  . Physical activity    Days per week: Patient refused    Minutes per session: Patient refused  . Stress: Not at all  Relationships  . Social Herbalist on phone: Patient refused    Gets together: Patient refused    Attends religious service: Patient refused    Active member of club or organization: Patient refused    Attends meetings of clubs or organizations: Patient refused    Relationship status: Patient refused  . Intimate partner violence    Fear of current or ex partner: Patient refused    Emotionally abused: Patient refused    Physically abused: Patient refused    Forced sexual activity: Patient refused  Other Topics Concern  . Not on file  Social History Narrative  . Not on file   Family History  Problem Relation Age of Onset  . Cancer Sister       VITAL SIGNS BP 112/62   Pulse 82   Temp 98.9 F (37.2 C) (Oral)  Resp 16   Ht 5\' 2"  (1.575 m)   Wt 110 lb 8 oz (50.1 kg)   SpO2 95%   BMI 20.21 kg/m   Outpatient Encounter Medications as of 03/12/2019  Medication Sig  . acetaminophen (TYLENOL) 325 MG tablet Take 2 tablets (650 mg total) by mouth every 6 (six) hours as needed for mild pain (or Fever >/= 101).  Marland Kitchen amLODipine (NORVASC) 2.5 MG tablet Take 2.5 mg by mouth at bedtime.  Marland Kitchen aspirin EC 325 MG tablet Take 1 tablet (325 mg total) by mouth daily.  . Glucosamine-Chondroitin (OSTEO BI-FLEX REGULAR STRENGTH PO) Take 1 tablet by mouth daily.   . Multiple Vitamins-Minerals (MULTIVITAMIN WITH MINERALS) tablet Take 1 tablet by mouth daily.  . nabumetone (RELAFEN) 500 MG tablet Take 500 mg by mouth 2 (two) times daily as needed for pain.  . NON FORMULARY Diet:  Regular, NAS, Consistent Carbohydrate  . nutrition supplement, JUVEN, (JUVEN) PACK Take 1 packet by mouth 2 (two) times daily between meals.  Marland Kitchen oxyCODONE-acetaminophen (PERCOCET/ROXICET) 5-325 MG tablet Take 1 tablet by mouth every 6 (six) hours.  . senna-docusate (SENOKOT-S) 8.6-50 MG tablet Take 1 tablet by mouth at bedtime as needed for mild constipation.  . sertraline (ZOLOFT) 50 MG tablet Take 50 mg by mouth daily.   No facility-administered encounter medications on file as of 03/12/2019.      SIGNIFICANT DIAGNOSTIC EXAMS  03-09-19: 2-d echo:   1. Left ventricular ejection fraction, by visual estimation, is 65 to 70%. The left ventricle has normal function. Normal left ventricular size. There is no left ventricular hypertrophy.  2. Global right ventricle has normal systolic function.The right ventricular size is normal. No increase in right ventricular wall thickness.  3. Left atrial size was normal.  4. Right atrial size was normal.  5. The mitral valve is normal in structure. No evidence of mitral valve regurgitation. No evidence of mitral stenosis.  6. The tricuspid valve is normal in structure. Tricuspid valve regurgitation was not visualized by color flow Doppler.  7. Aortic valve mean gradient measures 54.0 mmHg.  8. Aortic valve peak gradient measures 84.6 mmHg.  9. The aortic valve is tricuspid with severely calcified leaflets. Aortic valve regurgitation was not visualized by color flow Doppler. Severe aortic valve stenosis. 10. The pulmonic valve was normal in structure. Pulmonic valve regurgitation is not visualized by color flow Doppler. 11. The inferior vena cava is normal in size with greater than 50% respiratory variability, suggesting right atrial pressure of 3 mmHg. 12. Left ventricular diastolic Doppler parameters are consistent with impaired relaxation pattern of LV diastolic filling. 13. Elevated left ventricular end-diastolic pressure. 14. Moderate mitral annular  calcification.  03-09-19: ct of left hip: Acute, mildly impacted subcapital fracture of the proximal left femur.  LABS REVIEWED TODAY;   03-08-19: wbc 11.1; hgb 12.8; hct 39.7; mcv 95.9; plt 241; glucose 106; bun 20; creat 1.25; k+ 4.4; na++ 141; ca 9.4; blood culture: no growth 03-11-19: wbc 8.0; hgb 8.6; hct 26.3; mcv 96.7; plt 191; glucose 97; bun 35; creat 1.27; k+ 4.4; na++ 137; ca 8.3 03-12-19: wbc 6.7; hgb 8.7; hct 26.4; mcv 96.0; plt 202; glucose 95; bun 35; creat 1.14; k+ 4.1; na++ 140; ca 8.9      Review of Systems  Constitutional: Negative for malaise/fatigue.  Respiratory: Negative for cough and shortness of breath.   Cardiovascular: Negative for chest pain, palpitations and leg swelling.  Gastrointestinal: Positive for constipation. Negative for abdominal pain and heartburn.  Musculoskeletal:  Positive for joint pain. Negative for back pain and myalgias.       Left hip and left arm pain   Skin: Negative.   Neurological: Negative for dizziness.  Psychiatric/Behavioral: The patient is not nervous/anxious.     Physical Exam Constitutional:      General: She is not in acute distress.    Appearance: She is well-developed. She is not diaphoretic.     Comments: thin  Neck:     Musculoskeletal: Neck supple.     Thyroid: No thyromegaly.  Cardiovascular:     Rate and Rhythm: Normal rate and regular rhythm.     Pulses: Normal pulses.     Heart sounds: Normal heart sounds.  Pulmonary:     Effort: Pulmonary effort is normal. No respiratory distress.     Breath sounds: Normal breath sounds.  Abdominal:     General: Bowel sounds are normal. There is no distension.     Palpations: Abdomen is soft.     Tenderness: There is no abdominal tenderness.  Musculoskeletal:     Right lower leg: No edema.     Left lower leg: No edema.     Comments: Left arm in sling Able to move other extremities Is status post left hip nail: 03-09-19 History of right hip nail: 2016  Lymphadenopathy:      Cervical: No cervical adenopathy.  Skin:    General: Skin is warm and dry.     Comments: Left arm bruising  Chronic painful lesion right lower extremity   Neurological:     Mental Status: She is alert and oriented to person, place, and time.  Psychiatric:        Mood and Affect: Mood normal.      ASSESSMENT/ PLAN:  TODAY;   1. Essential benign hypertension: is stable b/p 112/62 will continue asa 325 mg daily norvasc 2.5 mg daily   2. Rheumatoid arthritis involving multiple sites unspecified rheumatoid factor presence: is stable will continue relafen 500 mg twice daily as needed  3. Other closed nondisplaced fracture of proximal of left humerus sequela/ closed fracture of neck of left femur sequela: is stable will continue therapy as directed and will follow up with orthopedics. Will change to percocet 5/325 mg every 6 hours routinely through 03-19-19 will continue juven twice daily for wound healing.   4. Sequela of open wound of right lower extremity: will setup a dermatology consult will continue wound care as directed; will continue juven twice daily   5. Major depression recurrent chronic: is stable will continue zoloft 50 mg daily   6. Chronic constipation: is worse will change senna to 2 tabs daily   7. Acute blood loss anemia: is without change hgb 8.7 will continue to monitor her status.   Will check bmp hgb hct 03-19-19        MD is aware of resident's narcotic use and is in agreement with current plan of care. We will attempt to wean resident as appropriate.  Ok Edwards NP [facility]  Contact [phone] Monday through Friday 8am- 5pm  After hours call [phone]

## 2019-03-12 NOTE — Anesthesia Postprocedure Evaluation (Signed)
Anesthesia Post Note  Patient: Michaela Simpson  Procedure(s) Performed: CANNULATED HIP PINNING (Left Hip)     Patient location during evaluation: PACU Anesthesia Type: General Level of consciousness: awake and alert Pain management: pain level controlled Vital Signs Assessment: post-procedure vital signs reviewed and stable Respiratory status: spontaneous breathing, nonlabored ventilation, respiratory function stable and patient connected to nasal cannula oxygen Cardiovascular status: blood pressure returned to baseline and stable Postop Assessment: no apparent nausea or vomiting Anesthetic complications: no    Last Vitals:  Vitals:   03/12/19 0443 03/12/19 0814  BP: 130/63 112/62  Pulse: 76 82  Resp: 19 16  Temp: 36.9 C 37.2 C  SpO2: 92% 95%    Last Pain:  Vitals:   03/12/19 0814  TempSrc: Oral  PainSc:                  Samaiyah Howes S

## 2019-03-12 NOTE — Discharge Summary (Signed)
PATIENT DETAILS Name: Michaela Simpson Age: 85 y.o. Sex: female Date of Birth: 08/13/1931 MRN: [id number]. Admitting Physician: Ivor Costa, MD [id number], Edwinna Areola, MD  Admit Date: 03/08/2019 Discharge date: 03/12/2019  Recommendations for Outpatient Follow-up:  1. Follow up with PCP in 1-2 weeks 2. Please obtain BMP/CBC in 5 days 3. Please ensure follow up with cardiology, orthopedics and wound care  Admitted From:  Home  Disposition: SNF   Home Health: Yes  Equipment/Devices: None  Discharge Condition: Stable  CODE STATUS: FULL CODE  Diet recommendation:  Diet Order            Diet - low sodium heart healthy        Diet regular Room service appropriate? Yes; Fluid consistency: Thin  Diet effective now               Brief Summary: See H&P, Labs, Consult and Test reports for all details in brief, Patient is a 85 y.o. female with history of HTN, depression, osteoporosis, RA, chronic right leg ulcer-presented with a mechanical fall-further evaluation revealed a left hip and left humerus fracture.  See below for further details  Brief Hospital Course: Left hip fracture: Following a mechanical fall-underwent cannulated screw placement on 9/18.  Ortho recommending weightbearing as tolerated on the left lower extremity, and aspirin 325 mg daily for VTE prophylaxis.    Left humerus fracture: Orthopedics planning on nonoperative treatment with sling.  Right leg ulceration: Chronic issue-appreciate wound care eval. She follows at a local wound care center.    AKI: Likely hemodynamically mediated-resolved with supportive care.  Mild leukocytosis: No indication of infection-suspect secondary to stress/fracture/inflammation-WBC has normalized  Anemia: secondary to blood loss from hip fracture. No indication for transfusion, Hb stable for the past few days, please repeat in a few daysat SNF  Aortic stenosis: Severe by echocardiogram-but patient appears to be  remarkably asymptomatic-apparently is very functional-and in fact worked in the yard for 2 hours a day before she broke her hip.  She seems to have tolerated surgery pretty well.  Spoke with cardiology on-call Dr. Christopher-recommendations are for outpatient follow-up-have sent a epic message to cardiology to see if patient can be set up with general [facility]ology at [facility] and also with Dr Burt Knack for eval for TAVR.Long conversation with daughter/son in Sports coach (retired Administrator, [employer] Service) regarding this finding and need for follow up.  HTN: BP controlled-continue amlodipine  Depression: Stable-continue with Zoloft  Procedures/Studies: 9/18>>Cannulated screw placement, left hip  Discharge Diagnoses:  Principal Problem:   Closed left humeral fracture Active Problems:   Fall   Depression   HTN (hypertension)   Wound of right leg, initial encounter   Leukocytosis   Fracture of femoral neck, left, closed ([place])   AKI (acute kidney injury) [facility])   Discharge Instructions:  Activity:  weightbearing as tolerated on the left lower extremity Discharge Instructions    Call MD for:  redness, tenderness, or signs of infection (pain, swelling, redness, odor or green/yellow discharge around incision site)   Complete by: As directed    Diet - low sodium heart healthy   Complete by: As directed    Discharge instructions   Complete by: As directed    Follow with Primary MD  Celene Squibb, MD in 1-2 weeks  Ensure follow up with Ortho-Dr Ninfa Linden in 2 weeks  Please get a complete blood count and chemistry panel checked by your Primary MD at your next visit, and again as instructed by your Primary MD.  Get Medicines reviewed and adjusted: Please take all your medications with you for your next visit with your Primary MD  Laboratory/radiological data: Please request your Primary MD to go over all hospital tests and procedure/radiological results at the follow up, please ask your Primary MD to get all  Hospital records sent to his/her office.  In some cases, they will be blood work, cultures and biopsy results pending at the time of your discharge. Please request that your primary care M.D. follows up on these results.  Also Note the following: If you experience worsening of your admission symptoms, develop shortness of breath, life threatening emergency, suicidal or homicidal thoughts you must seek medical attention immediately by calling 911 or calling your MD immediately  if symptoms less severe.  You must read complete instructions/literature along with all the possible adverse reactions/side effects for all the Medicines you take and that have been prescribed to you. Take any new Medicines after you have completely understood and accpet all the possible adverse reactions/side effects.   Do not drive when taking Pain medications or sleeping medications (Benzodaizepines)  Do not take more than prescribed Pain, Sleep and Anxiety Medications. It is not advisable to combine anxiety,sleep and pain medications without talking with your primary care practitioner  Special Instructions: If you have smoked or chewed Tobacco  in the last 2 yrs please stop smoking, stop any regular Alcohol  and or any Recreational drug use.  Wear Seat belts while driving.  Please note: You were cared for by a hospitalist during your hospital stay. Once you are discharged, your primary care physician will handle any further medical issues. Please note that NO REFILLS for any discharge medications will be authorized once you are discharged, as it is imperative that you return to your primary care physician (or establish a relationship with a primary care physician if you do not have one) for your post hospital discharge needs so that they can reassess your need for medications and monitor your lab values.   Discharge wound care:   Complete by: As directed    Continue topical care using a twice daily application of an  antimicrobial nonadherent (xeroform) gauze topped with a dry gauze dressing and secured with an atraumatic securing mechanism.Use silicone foam, if that is unavailable, may use a few turns of a kerlix roll gauze dressing and paper tape to secure   Increase activity slowly   Complete by: As directed    weightbearing as tolerated on the left lower extremity     Allergies as of 03/12/2019      Reactions   Bee Pollen    Penicillins Other (See Comments)   Child hood allergy   Codeine Nausea And Vomiting, Rash      Medication List    STOP taking these medications   temazepam 7.5 MG capsule Commonly known as: RESTORIL   traMADol 50 MG tablet Commonly known as: ULTRAM     TAKE these medications   acetaminophen 325 MG tablet Commonly known as: TYLENOL Take 2 tablets (650 mg total) by mouth every 6 (six) hours as needed for mild pain (or Fever >/= 101). What changed: how much to take   amLODipine 2.5 MG tablet Commonly known as: NORVASC Take 2.5 mg by mouth at bedtime.   aspirin EC 325 MG tablet Take 1 tablet (325 mg total) by mouth daily.   multivitamin with minerals tablet Take 1 tablet by mouth daily.   nabumetone 500 MG tablet Commonly known as: RELAFEN  Take 500 mg by mouth 2 (two) times daily as needed for pain.   nutrition supplement (JUVEN) Pack Take 1 packet by mouth 2 (two) times daily between meals.   OSTEO BI-FLEX REGULAR STRENGTH PO Take 1 tablet by mouth daily.   oxyCODONE-acetaminophen 5-325 MG tablet Commonly known as: PERCOCET/ROXICET Take 1 tablet by mouth every 6 (six) hours as needed for up to 3 days for moderate pain.   senna-docusate 8.6-50 MG tablet Commonly known as: Senokot-S Take 1 tablet by mouth at bedtime as needed for mild constipation.   sertraline 50 MG tablet Commonly known as: ZOLOFT Take 50 mg by mouth daily.            Discharge Care Instructions  (From admission, onward)         Start     Ordered   03/12/19 0000   Discharge wound care:    Comments: Continue topical care using a twice daily application of an antimicrobial nonadherent (xeroform) gauze topped with a dry gauze dressing and secured with an atraumatic securing mechanism.Use silicone foam, if that is unavailable, may use a few turns of a kerlix roll gauze dressing and paper tape to secure   03/12/19 0936          Contact information for follow-up providers    Mcarthur Rossetti, MD. Schedule an appointment as soon as possible for a visit in 2 week(s).   Specialty: Orthopedic Surgery Contact information: [name] [address] [phone]        Celene Squibb, MD. Schedule an appointment as soon as possible for a visit in 1 week(s).   Specialty: Internal Medicine Contact information: [name] [address] [address]. Schedule an appointment as soon as possible for a visit in 2 week(s).   Specialty: Cardiology Why: for severe aortic stenosis Contact information: [place] [phone]           Contact information for after-discharge care    [facility] .   Service: Skilled Nursing Contact information: [address] [address] [phone]                 Allergies  Allergen Reactions   Bee Pollen    Penicillins Other (See Comments)    Child hood allergy   Codeine Nausea And Vomiting and Rash    Consultations:   orthopedic surgery  Other Procedures/Studies: Pelvis Portable  Result Date: 03/09/2019 CLINICAL DATA:  Percutaneous pinning EXAM: PORTABLE PELVIS 1-2 VIEWS COMPARISON:  03/09/2019 FINDINGS: The patient has undergone percutaneous pinning of the left hip. The alignment is stable. The hardware is intact. Expected postsurgical changes are noted. The patient is status post remote right-sided intramedullary  nail placement. IMPRESSION: Satisfactory postoperative appearance following percutaneous pinning of the left hip. No evidence of hardware complication. Electronically Signed   By: Constance Holster M.D.   On: 03/09/2019 19:07   Ct Hip Left Wo Contrast  Result Date: 03/09/2019 CLINICAL DATA:  Left hip pain after fall EXAM: CT OF THE LEFT HIP WITHOUT CONTRAST TECHNIQUE: Multidetector CT imaging of the left hip was performed according to the standard protocol. Multiplanar CT image reconstructions were also generated. COMPARISON:  X-ray 03/08/2019 FINDINGS: Bones/Joint/Cartilage Acute mildly impacted subcapital fracture of the proximal left femur. Approximately 1.4 cm of foreshortening. No involvement of the intertrochanteric aspect  of the femur. Femoral head contour appears maintained without definite fracture involvement. Mild left hip joint space narrowing. No dislocation. No additional fracture is identified. There are moderate degenerative changes of the visualized left sacroiliac joint. Ligaments Suboptimally assessed by CT. Muscles and Tendons No significant muscular atrophy. Tendinous structures grossly intact. Soft tissues No large soft tissue hematoma. There is sigmoid diverticulosis without acute finding within the pelvis. IMPRESSION: Acute, mildly impacted subcapital fracture of the proximal left femur. Electronically Signed   By: Davina Poke M.D.   On: 03/09/2019 13:37   Dg C-arm 1-60 Min  Result Date: 03/09/2019 CLINICAL DATA:  Percutaneous pinning of the left hip EXAM: DG C-ARM 1-60 MIN FLUOROSCOPY TIME:  Fluoroscopy Time:  1 minutes and 29 seconds Number of Acquired Spot Images: 7 COMPARISON:  03/09/2019 FINDINGS: There are expected postsurgical changes related to left hip percutaneous pinning. The hardware is intact. There is no unexpected radiopaque foreign body. IMPRESSION: Status post left hip percutaneous pinning. No unexpected radiopaque foreign body. Electronically Signed   By:  Constance Holster M.D.   On: 03/09/2019 19:07   Dg Hip Operative Unilat W Or W/o Pelvis Left  Result Date: 03/09/2019 CLINICAL DATA:  Hip pending EXAM: OPERATIVE LEFT HIP (WITH PELVIS IF PERFORMED) 7 VIEWS TECHNIQUE: Fluoroscopic spot image(s) were submitted for interpretation post-operatively. COMPARISON:  03/08/2019 FINDINGS: The patient has undergone percutaneous pinning of the left hip. The alignment is stable. The hardware appears intact. There is no unexpected radiopaque foreign body. Expected postsurgical changes are noted. IMPRESSION: Status post percutaneous pinning of the left hip. No evidence of hardware complication or unexpected radiopaque foreign body. Electronically Signed   By: Constance Holster M.D.   On: 03/09/2019 19:06      TODAY-DAY OF DISCHARGE:  Subjective:   Shirl [place] today has no headache,no chest abdominal pain,no new weakness tingling or numbness, feels much better wants to go home today.   Objective:   Blood pressure 112/62, pulse 82, temperature 98.9 F (37.2 C), temperature source Oral, resp. rate 16, height 5\' 2"  (1.575 m), weight 51.4 kg, SpO2 95 %.  Intake/Output Summary (Last 24 hours) at 03/12/2019 0938 Last data filed at 03/12/2019 0017 Gross per 24 hour  Intake 840 ml  Output 900 ml  Net -60 ml   Filed Weights   03/09/19 0547  Weight: 51.4 kg    Exam: Awake Alert, Oriented *3, No new F.N deficits, Normal affect [place].AT,PERRAL Supple Neck,No JVD, No cervical lymphadenopathy appriciated.  Symmetrical Chest wall movement, Good air movement bilaterally, CTAB RRR,No Gallops,Rubs or new Murmurs, No Parasternal Heave +ve B.Sounds, Abd Soft, Non tender, No organomegaly appriciated, No rebound -guarding or rigidity. No Cyanosis, Clubbing or edema, No new Rash or bruise   PERTINENT RADIOLOGIC STUDIES: Pelvis Portable  Result Date: 03/09/2019 CLINICAL DATA:  Percutaneous pinning EXAM: PORTABLE PELVIS 1-2 VIEWS COMPARISON:  03/09/2019  FINDINGS: The patient has undergone percutaneous pinning of the left hip. The alignment is stable. The hardware is intact. Expected postsurgical changes are noted. The patient is status post remote right-sided intramedullary nail placement. IMPRESSION: Satisfactory postoperative appearance following percutaneous pinning of the left hip. No evidence of hardware complication. Electronically Signed   By: Constance Holster M.D.   On: 03/09/2019 19:07   Ct Hip Left Wo Contrast  Result Date: 03/09/2019 CLINICAL DATA:  Left hip pain after fall EXAM: CT OF THE LEFT HIP WITHOUT CONTRAST TECHNIQUE: Multidetector CT imaging of the left hip was performed according to the standard protocol. Multiplanar CT  image reconstructions were also generated. COMPARISON:  X-ray 03/08/2019 FINDINGS: Bones/Joint/Cartilage Acute mildly impacted subcapital fracture of the proximal left femur. Approximately 1.4 cm of foreshortening. No involvement of the intertrochanteric aspect of the femur. Femoral head contour appears maintained without definite fracture involvement. Mild left hip joint space narrowing. No dislocation. No additional fracture is identified. There are moderate degenerative changes of the visualized left sacroiliac joint. Ligaments Suboptimally assessed by CT. Muscles and Tendons No significant muscular atrophy. Tendinous structures grossly intact. Soft tissues No large soft tissue hematoma. There is sigmoid diverticulosis without acute finding within the pelvis. IMPRESSION: Acute, mildly impacted subcapital fracture of the proximal left femur. Electronically Signed   By: Davina Poke M.D.   On: 03/09/2019 13:37   Dg C-arm 1-60 Min  Result Date: 03/09/2019 CLINICAL DATA:  Percutaneous pinning of the left hip EXAM: DG C-ARM 1-60 MIN FLUOROSCOPY TIME:  Fluoroscopy Time:  1 minutes and 29 seconds Number of Acquired Spot Images: 7 COMPARISON:  03/09/2019 FINDINGS: There are expected postsurgical changes related to left  hip percutaneous pinning. The hardware is intact. There is no unexpected radiopaque foreign body. IMPRESSION: Status post left hip percutaneous pinning. No unexpected radiopaque foreign body. Electronically Signed   By: Constance Holster M.D.   On: 03/09/2019 19:07   Dg Hip Operative Unilat W Or W/o Pelvis Left  Result Date: 03/09/2019 CLINICAL DATA:  Hip pending EXAM: OPERATIVE LEFT HIP (WITH PELVIS IF PERFORMED) 7 VIEWS TECHNIQUE: Fluoroscopic spot image(s) were submitted for interpretation post-operatively. COMPARISON:  03/08/2019 FINDINGS: The patient has undergone percutaneous pinning of the left hip. The alignment is stable. The hardware appears intact. There is no unexpected radiopaque foreign body. Expected postsurgical changes are noted. IMPRESSION: Status post percutaneous pinning of the left hip. No evidence of hardware complication or unexpected radiopaque foreign body. Electronically Signed   By: Constance Holster M.D.   On: 03/09/2019 19:06     PERTINENT LAB RESULTS: CBC: Recent Labs    03/11/19 0540 03/12/19 0543  WBC 8.0 6.7  HGB 8.6* 8.7*  HCT 26.3* 26.4*  PLT 191 202   CMET CMP     Component Value Date/Time   NA 140 03/12/2019 0543   K 4.1 03/12/2019 0543   CL 106 03/12/2019 0543   CO2 26 03/12/2019 0543   GLUCOSE 95 03/12/2019 0543   BUN 35 (H) 03/12/2019 0543   CREATININE 1.14 (H) 03/12/2019 0543   CALCIUM 8.9 03/12/2019 0543   PROT 6.7 03/22/2015 0919   ALBUMIN 3.6 03/22/2015 0919   AST 36 03/22/2015 0919   ALT 26 03/22/2015 0919   ALKPHOS 71 03/22/2015 0919   BILITOT 1.2 03/22/2015 0919   GFRNONAA 44 (L) 03/12/2019 0543   GFRAA 51 (L) 03/12/2019 0543    GFR Estimated Creatinine Clearance: 28.5 mL/min (A) (by C-G formula based on SCr of 1.14 mg/dL (H)). No results for input(s): LIPASE, AMYLASE in the last 72 hours. No results for input(s): CKTOTAL, CKMB, CKMBINDEX, TROPONINI in the last 72 hours. Invalid input(s): POCBNP No results for input(s):  DDIMER in the last 72 hours. No results for input(s): HGBA1C in the last 72 hours. No results for input(s): CHOL, HDL, LDLCALC, TRIG, CHOLHDL, LDLDIRECT in the last 72 hours. No results for input(s): TSH, T4TOTAL, T3FREE, THYROIDAB in the last 72 hours.  Invalid input(s): FREET3 No results for input(s): VITAMINB12, FOLATE, FERRITIN, TIBC, IRON, RETICCTPCT in the last 72 hours. Coags: No results for input(s): INR in the last 72 hours.  Invalid input(s): PT  Microbiology: Recent Results (from the past 240 hour(s))  SARS Coronavirus 2 [place] order, Performed in [facility] hospital lab) Nasopharyngeal Nasopharyngeal Swab     Status: None   Collection Time: 03/08/19  9:45 PM   Specimen: Nasopharyngeal Swab  Result Value Ref Range Status   SARS Coronavirus 2 NEGATIVE NEGATIVE Final    Comment: (NOTE) If result is NEGATIVE SARS-CoV-2 target nucleic acids are NOT DETECTED. The SARS-CoV-2 RNA is generally detectable in upper and lower  respiratory specimens during the acute phase of infection. The lowest  concentration of SARS-CoV-2 viral copies this assay can detect is 250  copies / mL. A negative result does not preclude SARS-CoV-2 infection  and should not be used as the sole basis for treatment or other  patient management decisions.  A negative result may occur with  improper specimen collection / handling, submission of specimen other  than nasopharyngeal swab, presence of viral mutation(s) within the  areas targeted by this assay, and inadequate number of viral copies  (<250 copies / mL). A negative result must be combined with clinical  observations, patient history, and epidemiological information. If result is POSITIVE SARS-CoV-2 target nucleic acids are DETECTED. The SARS-CoV-2 RNA is generally detectable in upper and lower  respiratory specimens dur ing the acute phase of infection.  Positive  results are indicative of active infection with SARS-CoV-2.  Clinical    correlation with patient history and other diagnostic information is  necessary to determine patient infection status.  Positive results do  not rule out bacterial infection or co-infection with other viruses. If result is PRESUMPTIVE POSTIVE SARS-CoV-2 nucleic acids MAY BE PRESENT.   A presumptive positive result was obtained on the submitted specimen  and confirmed on repeat testing.  While 2019 novel coronavirus  (SARS-CoV-2) nucleic acids may be present in the submitted sample  additional confirmatory testing may be necessary for epidemiological  and / or clinical management purposes  to differentiate between  SARS-CoV-2 and other Sarbecovirus currently known to infect humans.  If clinically indicated additional testing with an alternate test  methodology [phone]) is advised. The SARS-CoV-2 RNA is generally  detectable in upper and lower respiratory sp ecimens during the acute  phase of infection. The expected result is Negative. Fact Sheet for Patients:  StrictlyIdeas.no Fact Sheet for Healthcare Providers: BankingDealers.co.za This test is not yet approved or cleared by the [place] FDA and has been authorized for detection and/or diagnosis of SARS-CoV-2 by FDA under an Emergency Use Authorization (EUA).  This EUA will remain in effect (meaning this test can be used) for the duration of the COVID-19 declaration under Section 564(b)(1) of the Act, 21 U.S.C. section 360bbb-3(b)(1), unless the authorization is terminated or revoked sooner. Performed at [facility], [place] [address], [place], [place] [address]   Culture, blood (Routine X 2) w Reflex to ID Panel     Status: None (Preliminary result)   Collection Time: 03/08/19 11:27 PM   Specimen: BLOOD  Result Value Ref Range Status   Specimen Description BLOOD RIGHT ANTECUBITAL  Final   Special Requests   Final    BOTTLES DRAWN AEROBIC ONLY Blood Culture adequate volume    Culture   Final    NO GROWTH 4 DAYS Performed at [facility], [place] [address] [place], [place] [address]    Report Status PENDING  Incomplete  Culture, blood (Routine X 2) w Reflex to ID Panel     Status: None (Preliminary result)  Collection Time: 03/08/19 11:27 PM   Specimen: BLOOD RIGHT HAND  Result Value Ref Range Status   Specimen Description BLOOD RIGHT HAND  Final   Special Requests   Final    BOTTLES DRAWN AEROBIC ONLY Blood Culture adequate volume   Culture   Final    NO GROWTH 4 DAYS Performed at [facility], [address] [place], [place] [address]    Report Status PENDING  Incomplete  Surgical PCR screen     Status: None   Collection Time: 03/09/19 11:17 AM   Specimen: Nasal Mucosa; Nasal Swab  Result Value Ref Range Status   MRSA, PCR NEGATIVE NEGATIVE Final   Staphylococcus aureus NEGATIVE NEGATIVE Final    Comment: (NOTE) The Xpert SA Assay (FDA approved for NASAL specimens in patients 75 years of age and older), is one component of a comprehensive surveillance program. It is not intended to diagnose infection nor to guide or monitor treatment. Performed at [facility], [place] [address], [place], [place] [address]     FURTHER DISCHARGE INSTRUCTIONS:  Get Medicines reviewed and adjusted: Please take all your medications with you for your next visit with your Primary MD  Laboratory/radiological data: Please request your Primary MD to go over all hospital tests and procedure/radiological results at the follow up, please ask your Primary MD to get all Hospital records sent to his/her office.  In some cases, they will be blood work, cultures and biopsy results pending at the time of your discharge. Please request that your primary care M.D. goes through all the records of your hospital data and follows up on these results.  Also Note the following: If you experience worsening of your admission symptoms, develop shortness of breath, life  threatening emergency, suicidal or homicidal thoughts you must seek medical attention immediately by calling 911 or calling your MD immediately  if symptoms less severe.  You must read complete instructions/literature along with all the possible adverse reactions/side effects for all the Medicines you take and that have been prescribed to you. Take any new Medicines after you have completely understood and accpet all the possible adverse reactions/side effects.   Do not drive when taking Pain medications or sleeping medications (Benzodaizepines)  Do not take more than prescribed Pain, Sleep and Anxiety Medications. It is not advisable to combine anxiety,sleep and pain medications without talking with your primary care practitioner  Special Instructions: If you have smoked or chewed Tobacco  in the last 2 yrs please stop smoking, stop any regular Alcohol  and or any Recreational drug use.  Wear Seat belts while driving.  Please note: You were cared for by a hospitalist during your hospital stay. Once you are discharged, your primary care physician will handle any further medical issues. Please note that NO REFILLS for any discharge medications will be authorized once you are discharged, as it is imperative that you return to your primary care physician (or establish a relationship with a primary care physician if you do not have one) for your post hospital discharge needs so that they can reassess your need for medications and monitor your lab values.  Total Time spent coordinating discharge including counseling, education and face to face time equals 35 minutes.  SignedOren Binet 03/12/2019 9:38 AM

## 2019-03-12 NOTE — TOC Transition Note (Addendum)
Transition of Care [facility]) - CM/SW Discharge Note   Patient Details  Name: Michaela Simpson MRN: [id number] Date of Birth: 1932-03-25  Transition of Care [facility]) CM/SW Contact:  Alberteen Sam, LCSW Phone Number: 03/12/2019, 9:23 AM   Clinical Narrative:     Patient will DC to: [facility] Anticipated DC date: 03/12/2019 Family notified:Cynthia Transport [id number]  Per MD patient ready for DC to [facility] . RN, patient, patient's family, and facility notified of DC. Discharge Summary sent to facility. RN given number for report   [phone] . DC packet on chart. Ambulance transport requested for patient for 12:00 pm per RN request.    Pricilla Riffle, LCSW [phone]   Final next level of care: [place] Barriers to Discharge: No Barriers Identified   Patient Goals and CMS Choice Patient states their goals for this hospitalization and ongoing recovery are:: to go to SNF before returning home CMS Medicare.gov Compare Post Acute Care list provided to:: Patient Choice offered to / list presented to : Patient  Discharge Placement PASRR number recieved: 03/11/19            Patient chooses bed at: [facility] Patient to be transferred to facility by: [place] Name of family member notified: Caren Griffins (daughter) Patient and family notified of of transfer: 03/12/19  Discharge Plan and Services                                     Social Determinants of Health (SDOH) Interventions     Readmission Risk Interventions No flowsheet data found.

## 2019-03-13 ENCOUNTER — Non-Acute Institutional Stay (SKILLED_NURSING_FACILITY): Payer: Medicare Other | Admitting: Adult Health

## 2019-03-13 ENCOUNTER — Encounter: Payer: Self-pay | Admitting: Adult Health

## 2019-03-13 DIAGNOSIS — S42295S Other nondisplaced fracture of upper end of left humerus, sequela: Secondary | ICD-10-CM

## 2019-03-13 DIAGNOSIS — M069 Rheumatoid arthritis, unspecified: Secondary | ICD-10-CM | POA: Diagnosis not present

## 2019-03-13 DIAGNOSIS — S72002S Fracture of unspecified part of neck of left femur, sequela: Secondary | ICD-10-CM | POA: Diagnosis not present

## 2019-03-13 LAB — CULTURE, BLOOD (ROUTINE X 2)
Culture: NO GROWTH
Culture: NO GROWTH
Special Requests: ADEQUATE
Special Requests: ADEQUATE

## 2019-03-13 MED FILL — Fentanyl Citrate Preservative Free (PF) Inj 100 MCG/2ML: INTRAMUSCULAR | Qty: 2 | Status: AC

## 2019-03-13 NOTE — Progress Notes (Signed)
Location:    [place] Room Number: 158/P Place of Service:  SNF (31)   CODE STATUS: Full Code  Allergies  Allergen Reactions  . Bee Pollen   . Penicillins Other (See Comments)    Child hood allergy  . Codeine Nausea And Vomiting and Rash    Chief Complaint  Patient presents with  . Acute Visit    72 Hour Care Plan Meeting    HPI:  We have come together for her 46 hour care plan meeting. She has one step to get into her house. She has a shower with a bench. She may need a wheelchair or walker upon discharge. She lives alone. She is participating therapy. She had a fall and suffered a left humerus and left hip fracture. She denies any uncontrolled pain. She denies any changes in her appetite. She is sleeping well at night. Her goal is to return back home.   Past Medical History:  Diagnosis Date  . Depression   . HTN (hypertension)   . Osteoporosis   . Rheumatoid arthritis [facility])     Past Surgical History:  Procedure Laterality Date  . HIP PINNING,CANNULATED Left 03/09/2019   Procedure: CANNULATED HIP PINNING;  Surgeon: Mcarthur Rossetti, MD;  Location: [place];  Service: Orthopedics;  Laterality: Left;  . INTRAMEDULLARY (IM) NAIL INTERTROCHANTERIC Right 03/14/2015   Procedure: RIGHT INTERTROCHANTRIC INTRAMEDULLARY (IM) NAIL ;  Surgeon: Mcarthur Rossetti, MD;  Location: [place];  Service: Orthopedics;  Laterality: Right;    Social History   Socioeconomic History  . Marital status: Widowed    Spouse name: Not on file  . Number of children: Not on file  . Years of education: Not on file  . Highest education level: Not on file  Occupational History  . Not on file  Social Needs  . Financial resource strain: Not hard at all  . Food insecurity    Worry: Never true    Inability: Never true  . Transportation needs    Medical: No    Non-medical: No  Tobacco Use  . Smoking status: Never Smoker  . Smokeless tobacco: Never Used  Substance  and Sexual Activity  . Alcohol use: No  . Drug use: No  . Sexual activity: Not on file  Lifestyle  . Physical activity    Days per week: Patient refused    Minutes per session: Patient refused  . Stress: Not at all  Relationships  . Social Herbalist on phone: Patient refused    Gets together: Patient refused    Attends religious service: Patient refused    Active member of club or organization: Patient refused    Attends meetings of clubs or organizations: Patient refused    Relationship status: Patient refused  . Intimate partner violence    Fear of current or ex partner: Patient refused    Emotionally abused: Patient refused    Physically abused: Patient refused    Forced sexual activity: Patient refused  Other Topics Concern  . Not on file  Social History Narrative  . Not on file   Family History  Problem Relation Age of Onset  . Cancer Sister       VITAL SIGNS BP 113/68   Pulse 68   Temp 97.8 F (36.6 C) (Oral)   Resp 20   Ht 5\' 2"  (1.575 m)   Wt 110 lb 8 oz (50.1 kg)   SpO2 99%   BMI 20.21 kg/m  Outpatient Encounter Medications as of 03/13/2019  Medication Sig  . acetaminophen (TYLENOL) 325 MG tablet Take 2 tablets (650 mg total) by mouth every 6 (six) hours as needed for mild pain (or Fever >/= 101).  Marland Kitchen amLODipine (NORVASC) 2.5 MG tablet Take 2.5 mg by mouth at bedtime.  Marland Kitchen aspirin EC 325 MG tablet Take 1 tablet (325 mg total) by mouth daily.  . Glucosamine-Chondroitin (OSTEO BI-FLEX REGULAR STRENGTH PO) Take 1 tablet by mouth daily.   . Multiple Vitamins-Minerals (MULTIVITAMIN WITH MINERALS) tablet Take 1 tablet by mouth daily.  . nabumetone (RELAFEN) 500 MG tablet Take 500 mg by mouth 2 (two) times daily as needed for pain.  . NON FORMULARY Diet: Regular, NAS, Consistent Carbohydrate  . nutrition supplement, JUVEN, (JUVEN) PACK Take 1 packet by mouth 2 (two) times daily between meals.  Marland Kitchen oxyCODONE-acetaminophen (PERCOCET/ROXICET) 5-325 MG  tablet Take 1 tablet by mouth every 6 (six) hours.  . senna-docusate (SENOKOT-S) 8.6-50 MG tablet Take 1 tablet by mouth at bedtime as needed for mild constipation.  . sertraline (ZOLOFT) 50 MG tablet Take 50 mg by mouth daily.   No facility-administered encounter medications on file as of 03/13/2019.      SIGNIFICANT DIAGNOSTIC EXAMS  PREVIOUS   03-09-19: 2-d echo:   1. Left ventricular ejection fraction, by visual estimation, is 65 to 70%. The left ventricle has normal function. Normal left ventricular size. There is no left ventricular hypertrophy.  2. Global right ventricle has normal systolic function.The right ventricular size is normal. No increase in right ventricular wall thickness.  3. Left atrial size was normal.  4. Right atrial size was normal.  5. The mitral valve is normal in structure. No evidence of mitral valve regurgitation. No evidence of mitral stenosis.  6. The tricuspid valve is normal in structure. Tricuspid valve regurgitation was not visualized by color flow Doppler.  7. Aortic valve mean gradient measures 54.0 mmHg.  8. Aortic valve peak gradient measures 84.6 mmHg.  9. The aortic valve is tricuspid with severely calcified leaflets. Aortic valve regurgitation was not visualized by color flow Doppler. Severe aortic valve stenosis. 10. The pulmonic valve was normal in structure. Pulmonic valve regurgitation is not visualized by color flow Doppler. 11. The inferior vena cava is normal in size with greater than 50% respiratory variability, suggesting right atrial pressure of 3 mmHg. 12. Left ventricular diastolic Doppler parameters are consistent with impaired relaxation pattern of LV diastolic filling. 13. Elevated left ventricular end-diastolic pressure. 14. Moderate mitral annular calcification.  03-09-19: ct of left hip: Acute, mildly impacted subcapital fracture of the proximal left femur.  NO NEW EXAMS  LABS REVIEWED PREVIOUS  03-08-19: wbc 11.1; hgb 12.8;  hct 39.7; mcv 95.9; plt 241; glucose 106; bun 20; creat 1.25; k+ 4.4; na++ 141; ca 9.4; blood culture: no growth 03-11-19: wbc 8.0; hgb 8.6; hct 26.3; mcv 96.7; plt 191; glucose 97; bun 35; creat 1.27; k+ 4.4; na++ 137; ca 8.3 03-12-19: wbc 6.7; hgb 8.7; hct 26.4; mcv 96.0; plt 202; glucose 95; bun 35; creat 1.14; k+ 4.1; na++ 140; ca 8.9   NO NEW LABS.    Review of Systems  Constitutional: Negative for malaise/fatigue.  Respiratory: Negative for cough and shortness of breath.   Cardiovascular: Negative for chest pain, palpitations and leg swelling.  Gastrointestinal: Negative for abdominal pain, constipation and heartburn.  Musculoskeletal: Positive for joint pain. Negative for back pain and myalgias.       Hip and arm pain is managed  Skin: Negative.   Neurological: Negative for dizziness.  Psychiatric/Behavioral: The patient is not nervous/anxious.      Physical Exam Constitutional:      General: She is not in acute distress.    Appearance: She is well-developed. She is not diaphoretic.     Comments: thin  Neck:     Musculoskeletal: Neck supple.     Thyroid: No thyromegaly.  Cardiovascular:     Rate and Rhythm: Normal rate and regular rhythm.     Pulses: Normal pulses.     Heart sounds: Normal heart sounds.  Pulmonary:     Effort: Pulmonary effort is normal. No respiratory distress.     Breath sounds: Normal breath sounds.  Abdominal:     General: Bowel sounds are normal. There is no distension.     Palpations: Abdomen is soft.     Tenderness: There is no abdominal tenderness.  Musculoskeletal:     Right lower leg: No edema.     Left lower leg: No edema.     Comments: Left arm in sling Able to move other extremities Is status post left hip nail: 03-09-19 History of right hip nail: 2016   Lymphadenopathy:     Cervical: No cervical adenopathy.  Skin:    General: Skin is warm and dry.     Comments: Left arm bruising  Chronic painful lesion right lower extremity     Neurological:     Mental Status: She is alert and oriented to person, place, and time.  Psychiatric:        Mood and Affect: Mood normal.      ASSESSMENT/ PLAN:  TODAY;   1. Closed fracture of left femoral neck sequela 2. Other closed nondisplaced fracture of proximal end of left humerus sequela  3. Rheumatoid arthritis involving  multiple sites unspecified rheumatoid factor marker   Will continue therapy as directed Goal is to return back home May need a walker or wheelchair.     MD is aware of resident's narcotic use and is in agreement with current plan of care. We will attempt to wean resident as appropriate.  Ok Edwards NP [facility]  Contact [phone] Monday through Friday 8am- 5pm  After hours call [phone]

## 2019-03-13 NOTE — Progress Notes (Signed)
: Provider:  Hennie Duos., MD Location:  [place] Room Number: 158-P Place of Service:  SNF ([phone])  PCP: Celene Squibb, MD Patient Care Team: Celene Squibb, MD as PCP - General (Internal Medicine)  Extended Emergency Contact Information Primary Emergency Contact: Cresenzo,Cynthia Address: [address]          [place], [place] [address] [place] Phone: [phone] Mobile Phone: [phone] Relation: Daughter     Allergies: Bee pollen, Penicillins, and Codeine  Chief Complaint  Patient presents with   New Admit To SNF    New admission to [facility]    HPI: Patient is an 85 y.o. female with hypertension, depression, osteoporosis, RA, chronic right leg ulcer, who presented with mechanical fall revealing a left hip and left humerus fracture.  Patient was admitted to [facility] from 9/17-21 where patient underwent a cannulated screw placement on 9/18 with weightbearing as tolerated and aspirin and is VTE prophylaxis; left humerus fracture will be treated nonoperatively with a sling.  Patient had some AKI which resolved with IV fluids and anemia secondary to blood loss from the hip fracture or surgery but there was no indication for transfusion.  Patient.  Patient has severe aortic stenosis but appears to be remarkably asymptomatic and functional and tolerated the surgery well.  Patient is admitted to skilled nursing facility for OT/PT.  While at skilled nursing facility patient will be followed for hypertension treated with Norvasc, depression treated with Zoloft and aortic stenosis treated with watchful waiting.  Past Medical History:  Diagnosis Date   Depression    HTN (hypertension)    Osteoporosis    Rheumatoid arthritis [facility])     Past Surgical History:  Procedure Laterality Date   HIP PINNING,CANNULATED Left 03/09/2019   Procedure: CANNULATED HIP PINNING;  Surgeon: Mcarthur Rossetti, MD;  Location: [place];  Service: Orthopedics;  Laterality: Left;   INTRAMEDULLARY (IM) NAIL INTERTROCHANTERIC Right 03/14/2015   Procedure: RIGHT INTERTROCHANTRIC INTRAMEDULLARY (IM) NAIL ;  Surgeon: Mcarthur Rossetti, MD;  Location: [place];  Service: Orthopedics;  Laterality: Right;    Allergies as of 03/14/2019      Reactions   Bee Pollen    Penicillins Other (See Comments)   Child hood allergy   Codeine Nausea And Vomiting, Rash      Medication List       Accurate as of March 14, 2019 11:59 PM. If you have any questions, ask your nurse or doctor.        acetaminophen 325 MG tablet Commonly known as: TYLENOL Take 2 tablets (650 mg total) by mouth every 6 (six) hours as needed for mild pain (or Fever >/= 101).   amLODipine 2.5 MG tablet Commonly known as: NORVASC Take 2.5 mg by mouth at bedtime.   aspirin EC 325 MG tablet Take 1 tablet (325 mg total) by mouth daily.   multivitamin with minerals tablet Take 1 tablet by mouth daily.   nabumetone 500 MG tablet Commonly known as: RELAFEN Take 500 mg by mouth 2 (two) times daily as needed for pain.   NON FORMULARY Diet: Regular, NAS, Consistent Carbohydrate   nutrition supplement (JUVEN) Pack Take 1 packet by mouth 2 (two) times daily between meals.   OSTEO BI-FLEX REGULAR STRENGTH PO Take 1 tablet by mouth daily.   oxyCODONE-acetaminophen 5-325 MG tablet Commonly known as: PERCOCET/ROXICET Take 1 tablet by mouth every 6 (six) hours.   senna-docusate 8.6-50  MG tablet Commonly known as: Senokot-S Take 1 tablet by mouth at bedtime as needed for mild constipation.   sertraline 50 MG tablet Commonly known as: ZOLOFT Take 50 mg by mouth daily.       No orders of the defined types were placed in this encounter.   Immunization History  Administered Date(s) Administered   Fluad Quad(high Dose 65+) 03/12/2019    Social History   Tobacco Use   Smoking status: Never Smoker   Smokeless tobacco: Never Used  Substance  Use Topics   Alcohol use: No    Family history is   Family History  Problem Relation Age of Onset   Cancer Sister       Review of Systems  DATA OBTAINED: from patient GENERAL:  no fevers, fatigue, appetite changes SKIN: No itching, or rash EYES: No eye pain, redness, discharge EARS: No earache, tinnitus, change in hearing NOSE: No congestion, drainage or bleeding  MOUTH/THROAT: No mouth or tooth pain, No sore throat RESPIRATORY: No cough, wheezing, SOB CARDIAC: No chest pain, palpitations, lower extremity edema  GI: No abdominal pain, No N/V/D or constipation, No heartburn or reflux  GU: No dysuria, frequency or urgency, or incontinence  MUSCULOSKELETAL: No unrelieved bone/joint pain NEUROLOGIC: No headache, dizziness or focal weakness PSYCHIATRIC: No c/o anxiety or sadness   Vitals:   03/13/19 0824  BP: 113/68  Pulse: 68  Resp: 20  Temp: 97.8 F (36.6 C)  SpO2: 99%    SpO2 Readings from Last 1 Encounters:  03/13/19 99%   Body mass index is 20.21 kg/m.     Physical Exam  GENERAL APPEARANCE: Alert, conversant,  No acute distress.  Appearing much much younger than her stated age SKIN: No diaphoresis rash HEAD: Normocephalic, atraumatic  EYES: Conjunctiva/lids clear. Pupils round, reactive. EOMs intact.  EARS: External exam WNL, canals clear. Hearing grossly normal.  NOSE: No deformity or discharge.  MOUTH/THROAT: Lips w/o lesions  RESPIRATORY: Breathing is even, unlabored. Lung sounds are clear   CARDIOVASCULAR: Heart RRR no murmurs, rubs or gallops. No peripheral edema.   GASTROINTESTINAL: Abdomen is soft, non-tender, not distended w/ normal bowel sounds. GENITOURINARY: Bladder non tender, not distended  MUSCULOSKELETAL: Left arm in sling NEUROLOGIC:  Cranial nerves 2-12 grossly intact. Moves all extremities  PSYCHIATRIC: Mood and affect appropriate to situation, no behavioral issues  Patient Active Problem List   Diagnosis Date Noted   Essential  hypertension, benign 03/15/2019   Sequelae of open wound of right lower extremity 03/15/2019   Major depression, recurrent, chronic (HCC) 03/15/2019   Chronic constipation 03/15/2019   Closed left humeral fracture 03/08/2019   Wound of right leg, initial encounter 03/08/2019   Leukocytosis 03/08/2019   Fracture of femoral neck, left, closed ([place]) 03/08/2019   AKI (acute kidney injury) ([place]) 03/08/2019   Depression    HTN (hypertension)    Rash and nonspecific skin eruption 04/06/2015   Acute blood loss anemia 03/22/2015   Edema 03/21/2015   Acute renal failure syndrome ([place])    Closed right hip fracture ([place]) 03/14/2015   Rheumatoid arthritis ([place]) 03/14/2015   Fall 03/14/2015      Labs reviewed: Basic Metabolic Panel:    Component Value Date/Time   NA 140 03/12/2019 0543   K 4.1 03/12/2019 0543   CL 106 03/12/2019 0543   CO2 26 03/12/2019 0543   GLUCOSE 95 03/12/2019 0543   BUN 35 (H) 03/12/2019 0543   CREATININE 1.14 (H) 03/12/2019 0543   CALCIUM 8.9 03/12/2019 0543  PROT 6.7 03/22/2015 0919   ALBUMIN 3.6 03/22/2015 0919   AST 36 03/22/2015 0919   ALT 26 03/22/2015 0919   ALKPHOS 71 03/22/2015 0919   BILITOT 1.2 03/22/2015 0919   GFRNONAA 44 (L) 03/12/2019 0543   GFRAA 51 (L) 03/12/2019 0543    Recent Labs    03/10/19 0458 03/11/19 0540 03/12/19 0543  NA 139 137 140  K 4.6 4.4 4.1  CL 104 104 106  CO2 21* 24 26  GLUCOSE 116* 97 95  BUN 22 35* 35*  CREATININE 1.02* 1.27* 1.14*  CALCIUM 8.7* 8.3* 8.9   Liver Function Tests: No results for input(s): AST, ALT, ALKPHOS, BILITOT, PROT, ALBUMIN in the last 8760 hours. No results for input(s): LIPASE, AMYLASE in the last 8760 hours. No results for input(s): AMMONIA in the last 8760 hours. CBC: Recent Labs    03/10/19 0458 03/11/19 0540 03/12/19 0543  WBC 12.2* 8.0 6.7  HGB 11.0* 8.6* 8.7*  HCT 33.8* 26.3* 26.4*  MCV 96.8 96.7 96.0  PLT 213 191 202   Lipid No results for input(s):  CHOL, HDL, LDLCALC, TRIG in the last 8760 hours.  Cardiac Enzymes: No results for input(s): CKTOTAL, CKMB, CKMBINDEX, TROPONINI in the last 8760 hours. BNP: No results for input(s): BNP in the last 8760 hours. No results found for: MICROALBUR No results found for: HGBA1C No results found for: TSH No results found for: VITAMINB12 No results found for: FOLATE No results found for: IRON, TIBC, FERRITIN  Imaging and Procedures obtained prior to SNF admission: Pelvis Portable  Result Date: 03/09/2019 CLINICAL DATA:  Percutaneous pinning EXAM: PORTABLE PELVIS 1-2 VIEWS COMPARISON:  03/09/2019 FINDINGS: The patient has undergone percutaneous pinning of the left hip. The alignment is stable. The hardware is intact. Expected postsurgical changes are noted. The patient is status post remote right-sided intramedullary nail placement. IMPRESSION: Satisfactory postoperative appearance following percutaneous pinning of the left hip. No evidence of hardware complication. Electronically Signed   By: Constance Holster M.D.   On: 03/09/2019 19:07   Ct Hip Left Wo Contrast  Result Date: 03/09/2019 CLINICAL DATA:  Left hip pain after fall EXAM: CT OF THE LEFT HIP WITHOUT CONTRAST TECHNIQUE: Multidetector CT imaging of the left hip was performed according to the standard protocol. Multiplanar CT image reconstructions were also generated. COMPARISON:  X-ray 03/08/2019 FINDINGS: Bones/Joint/Cartilage Acute mildly impacted subcapital fracture of the proximal left femur. Approximately 1.4 cm of foreshortening. No involvement of the intertrochanteric aspect of the femur. Femoral head contour appears maintained without definite fracture involvement. Mild left hip joint space narrowing. No dislocation. No additional fracture is identified. There are moderate degenerative changes of the visualized left sacroiliac joint. Ligaments Suboptimally assessed by CT. Muscles and Tendons No significant muscular atrophy. Tendinous  structures grossly intact. Soft tissues No large soft tissue hematoma. There is sigmoid diverticulosis without acute finding within the pelvis. IMPRESSION: Acute, mildly impacted subcapital fracture of the proximal left femur. Electronically Signed   By: Davina Poke M.D.   On: 03/09/2019 13:37   Dg C-arm 1-60 Min  Result Date: 03/09/2019 CLINICAL DATA:  Percutaneous pinning of the left hip EXAM: DG C-ARM 1-60 MIN FLUOROSCOPY TIME:  Fluoroscopy Time:  1 minutes and 29 seconds Number of Acquired Spot Images: 7 COMPARISON:  03/09/2019 FINDINGS: There are expected postsurgical changes related to left hip percutaneous pinning. The hardware is intact. There is no unexpected radiopaque foreign body. IMPRESSION: Status post left hip percutaneous pinning. No unexpected radiopaque foreign body. Electronically Signed  By: Constance Holster M.D.   On: 03/09/2019 19:07   Dg Hip Operative Unilat W Or W/o Pelvis Left  Result Date: 03/09/2019 CLINICAL DATA:  Hip pending EXAM: OPERATIVE LEFT HIP (WITH PELVIS IF PERFORMED) 7 VIEWS TECHNIQUE: Fluoroscopic spot image(s) were submitted for interpretation post-operatively. COMPARISON:  03/08/2019 FINDINGS: The patient has undergone percutaneous pinning of the left hip. The alignment is stable. The hardware appears intact. There is no unexpected radiopaque foreign body. Expected postsurgical changes are noted. IMPRESSION: Status post percutaneous pinning of the left hip. No evidence of hardware complication or unexpected radiopaque foreign body. Electronically Signed   By: Constance Holster M.D.   On: 03/09/2019 19:06     Not all labs, radiology exams or other studies done during hospitalization come through on my EPIC note; however they are reviewed by me.    Assessment and Plan  Left hip fracture/left humerus fracture-both secondary to fall at ground level; hip fracture underwent a cannulated screw placement on 9/18 with ASA daily for VTE prophylaxis for 3  weeks soon since this not stated anywhere; humerus treated with sling SNF- admitted for OT/PT; continue left arm in sling, activities as tolerated and left hip status post surgery with weightbearing as tolerated ASA 325 mg daily for 3 weeks presumed, not stated in discharge  Acute blood loss anemia- hemoglobin dropped from preop level of 12.8 to postop level of 8.6 but there was no indication for transfusion, patient is doing well SNF- follow-up CBC  Right leg ulceration-chronic; is followed by wound care as outpatient SNF- will be followed by wound care at skilled nursing facility  AKI- stated is mild with creatinine back to baseline; discharge creatinine was 1.25 SNF- follow-up BMP  Severe aortic stenosis- patient has done remarkably well and is very functional and worked in the yard for 2 hours the day before she broke her hip.  She; she will be followed up by cardiology and by cardiovascular surgery for possible TAVR. SNF- continue supportive care and observation for hemodynamic compromise  Hypertension SNF- controlled; continue Norvasc 2.5 mg daily  Depression SNF-appears controlled; continues Zoloft 50 mg nightly  Time spent greater than 45 minutes;> 50% of time with patient was spent reviewing records, labs, tests and studies, counseling and developing plan of care  Hennie Duos, MD

## 2019-03-14 ENCOUNTER — Other Ambulatory Visit: Payer: Self-pay | Admitting: *Deleted

## 2019-03-14 ENCOUNTER — Non-Acute Institutional Stay (SKILLED_NURSING_FACILITY): Payer: Medicare Other | Admitting: Internal Medicine

## 2019-03-14 DIAGNOSIS — D62 Acute posthemorrhagic anemia: Secondary | ICD-10-CM | POA: Diagnosis not present

## 2019-03-14 DIAGNOSIS — S72002S Fracture of unspecified part of neck of left femur, sequela: Secondary | ICD-10-CM

## 2019-03-14 DIAGNOSIS — S42295S Other nondisplaced fracture of upper end of left humerus, sequela: Secondary | ICD-10-CM | POA: Diagnosis not present

## 2019-03-14 DIAGNOSIS — I10 Essential (primary) hypertension: Secondary | ICD-10-CM

## 2019-03-14 DIAGNOSIS — F320 Major depressive disorder, single episode, mild: Secondary | ICD-10-CM

## 2019-03-14 DIAGNOSIS — S81801S Unspecified open wound, right lower leg, sequela: Secondary | ICD-10-CM

## 2019-03-14 DIAGNOSIS — N179 Acute kidney failure, unspecified: Secondary | ICD-10-CM | POA: Diagnosis not present

## 2019-03-14 NOTE — Patient Outreach (Signed)
Member assessed for potential [facility] Care Management needs as a benefit of  [place] Medicare.  Member is currently receiving rehab therapy at [facility].  Collaboration with [facility] dc planner who reports no identifiable [facility] Care Management needs at this time.   Per chart records, member lives alone. Will continue to follow for disposition plans, progression, and for potential [facility] Care Management needs.    Marthenia Rolling, MSN-Ed, RN,BSN [place] Acute Care Coordinator [phone] [facility]) [phone]  (Toll free office)

## 2019-03-15 ENCOUNTER — Ambulatory Visit (HOSPITAL_COMMUNITY): Payer: Private Health Insurance - Indemnity | Admitting: Physical Therapy

## 2019-03-15 DIAGNOSIS — K5909 Other constipation: Secondary | ICD-10-CM

## 2019-03-15 DIAGNOSIS — I10 Essential (primary) hypertension: Secondary | ICD-10-CM

## 2019-03-15 DIAGNOSIS — S81801S Unspecified open wound, right lower leg, sequela: Secondary | ICD-10-CM

## 2019-03-15 DIAGNOSIS — F329 Major depressive disorder, single episode, unspecified: Secondary | ICD-10-CM | POA: Insufficient documentation

## 2019-03-15 DIAGNOSIS — F339 Major depressive disorder, recurrent, unspecified: Secondary | ICD-10-CM

## 2019-03-15 HISTORY — DX: Major depressive disorder, recurrent, unspecified: F33.9

## 2019-03-15 HISTORY — DX: Other constipation: K59.09

## 2019-03-15 HISTORY — DX: Major depressive disorder, single episode, unspecified: F32.9

## 2019-03-15 HISTORY — DX: Essential (primary) hypertension: I10

## 2019-03-15 HISTORY — DX: Unspecified open wound, right lower leg, sequela: S81.801S

## 2019-03-17 ENCOUNTER — Encounter: Payer: Self-pay | Admitting: Internal Medicine

## 2019-03-19 ENCOUNTER — Encounter (HOSPITAL_COMMUNITY)
Admission: RE | Admit: 2019-03-19 | Discharge: 2019-03-19 | Disposition: A | Discharge status: Home / Self Care | Payer: Medicare Other | Source: Skilled Nursing Facility | Attending: Adult Health | Admitting: Adult Health

## 2019-03-19 DIAGNOSIS — I10 Essential (primary) hypertension: Secondary | ICD-10-CM | POA: Insufficient documentation

## 2019-03-19 LAB — BASIC METABOLIC PANEL
Anion gap: 8 (ref 5–15)
BUN: 31 mg/dL — ABNORMAL HIGH (ref 8–23)
CO2: 25 mmol/L (ref 22–32)
Calcium: 9 mg/dL (ref 8.9–10.3)
Chloride: 106 mmol/L (ref 98–111)
Creatinine, Ser: 0.87 mg/dL (ref 0.44–1.00)
GFR calc Af Amer: >60 mL/min (ref >60)
GFR calc non Af Amer: >60 mL/min (ref >60)
Glucose, Bld: 97 mg/dL (ref 70–99)
Potassium: 4.1 mmol/L (ref 3.5–5.1)
Sodium: 139 mmol/L (ref 135–145)

## 2019-03-19 LAB — HEMOGLOBIN AND HEMATOCRIT, BLOOD
HCT: 28.9 % — ABNORMAL LOW (ref 36.0–46.0)
Hemoglobin: 8.8 g/dL — ABNORMAL LOW (ref 12.0–15.0)

## 2019-03-20 ENCOUNTER — Encounter: Payer: Self-pay | Admitting: Adult Health

## 2019-03-20 ENCOUNTER — Other Ambulatory Visit: Payer: Self-pay | Admitting: *Deleted

## 2019-03-20 ENCOUNTER — Non-Acute Institutional Stay (SKILLED_NURSING_FACILITY): Payer: Medicare Other | Admitting: Adult Health

## 2019-03-20 ENCOUNTER — Other Ambulatory Visit: Payer: Self-pay | Admitting: Adult Health

## 2019-03-20 DIAGNOSIS — S42295S Other nondisplaced fracture of upper end of left humerus, sequela: Secondary | ICD-10-CM

## 2019-03-20 DIAGNOSIS — M069 Rheumatoid arthritis, unspecified: Secondary | ICD-10-CM | POA: Diagnosis not present

## 2019-03-20 DIAGNOSIS — S72002S Fracture of unspecified part of neck of left femur, sequela: Secondary | ICD-10-CM

## 2019-03-20 DIAGNOSIS — I10 Essential (primary) hypertension: Secondary | ICD-10-CM

## 2019-03-20 MED ORDER — OXYCODONE-ACETAMINOPHEN 5-325 MG PO TABS: 1.0000 | ORAL_TABLET | Freq: Four times a day (QID) | ORAL | 0 refills | Status: AC

## 2019-03-20 NOTE — Patient Outreach (Addendum)
Member assessed for potential [facility] Care Management needs as a benefit of  [place] [employer].  Member is currently receiving rehab therapy at [facility].  Member discussed in weekly telephonic IDT meeting with facility staff, [facility] UM team, and writer.  Facility reports member lived alone prior to admit. She is participating with therapy. Facility dc planner reports likely disposition plan will be home with daughter and son in law.   Will continue to follow for progression and for potential [facility] CM needs.  Marthenia Rolling, MSN-Ed, RN,BSN [place] Acute Care Coordinator [phone] [facility]) [phone]  (Toll free office)

## 2019-03-20 NOTE — Progress Notes (Signed)
Location:    [place] Room Number: 158/P Place of Service:  SNF (31)   CODE STATUS: Full Code  Allergies  Allergen Reactions   Bee Pollen    Penicillins Other (See Comments)    Child hood allergy   Codeine Nausea And Vomiting and Rash   Chief Complaint  Patient presents with   Medical Management of Chronic Issues          .Other closed nondisplaced fracture of proximal of left humerus sequela/ closed fracture of neck of left femur sequela    Essential benign hypertension  Rheumatoid arthritis involving multiple sites unspecified rheumatoid factor presence:   Weekly follow up for the first 30 days post hospitalization.     HPI:  She is a 85 year old short term rehab patient being seen for the management of her chronic illnesses; hypertension; ra; left humerus fracture; left hip fracture. She continues to have uncontrolled pain; will need to have her percocet restarted on a routine basis. She denies any changes in her appetite; no anxiety no insomnia.   Past Medical History:  Diagnosis Date   Depression    HTN (hypertension)    Osteoporosis    Rheumatoid arthritis [facility])     Past Surgical History:  Procedure Laterality Date   HIP PINNING,CANNULATED Left 03/09/2019   Procedure: CANNULATED HIP PINNING;  Surgeon: Mcarthur Rossetti, MD;  Location: [place];  Service: Orthopedics;  Laterality: Left;   INTRAMEDULLARY (IM) NAIL INTERTROCHANTERIC Right 03/14/2015   Procedure: RIGHT INTERTROCHANTRIC INTRAMEDULLARY (IM) NAIL ;  Surgeon: Mcarthur Rossetti, MD;  Location: [place];  Service: Orthopedics;  Laterality: Right;    Social History   Socioeconomic History   Marital status: Widowed    Spouse name: Not on file   Number of children: Not on file   Years of education: Not on file   Highest education level: Not on file  Occupational History   Not on file  Social Needs   Financial resource strain: Not hard at all   Food insecurity      Worry: Never true    Inability: Never true   Transportation needs    Medical: No    Non-medical: No  Tobacco Use   Smoking status: Never Smoker   Smokeless tobacco: Never Used  Substance and Sexual Activity   Alcohol use: No   Drug use: No   Sexual activity: Not on file  Lifestyle   Physical activity    Days per week: Patient refused    Minutes per session: Patient refused   Stress: Not at all  Relationships   Social connections    Talks on phone: Patient refused    Gets together: Patient refused    Attends religious service: Patient refused    Active member of club or organization: Patient refused    Attends meetings of clubs or organizations: Patient refused    Relationship status: Patient refused   Intimate partner violence    Fear of current or ex partner: Patient refused    Emotionally abused: Patient refused    Physically abused: Patient refused    Forced sexual activity: Patient refused  Other Topics Concern   Not on file  Social History Narrative   Not on file   Family History  Problem Relation Age of Onset   Cancer Sister       VITAL SIGNS BP 120/71    Pulse 79    Temp 97.8 F (36.6 C) (Oral)  Resp 20    Ht 5\' 2"  (1.575 m)    Wt 107 lb 3.2 oz (48.6 kg)    SpO2 99%    BMI 19.61 kg/m   Outpatient Encounter Medications as of 03/20/2019  Medication Sig   acetaminophen (TYLENOL) 325 MG tablet Take 2 tablets (650 mg total) by mouth every 6 (six) hours as needed for mild pain (or Fever >/= 101).   amLODipine (NORVASC) 2.5 MG tablet Take 2.5 mg by mouth at bedtime.   aspirin EC 325 MG tablet Take 1 tablet (325 mg total) by mouth daily.   feeding supplement, ENSURE ENLIVE, (ENSURE ENLIVE) LIQD Take 237 mLs by mouth daily. Daily due to inadequate intake and increased needs   Glucosamine-Chondroitin (OSTEO BI-FLEX REGULAR STRENGTH PO) Take 1 tablet by mouth daily.    Multiple Vitamins-Minerals (MULTIVITAMIN WITH MINERALS) tablet Take 1  tablet by mouth daily.   nabumetone (RELAFEN) 500 MG tablet Take 500 mg by mouth 2 (two) times daily as needed for pain.   NON FORMULARY Diet: Regular, NAS, Consistent Carbohydrate   nutrition supplement, JUVEN, (JUVEN) PACK Take 1 packet by mouth 2 (two) times daily between meals.   senna-docusate (SENOKOT-S) 8.6-50 MG tablet Take 1 tablet by mouth at bedtime as needed for mild constipation.   sertraline (ZOLOFT) 50 MG tablet Take 50 mg by mouth daily.   [DISCONTINUED] oxyCODONE-acetaminophen (PERCOCET/ROXICET) 5-325 MG tablet Take 1 tablet by mouth every 6 (six) hours.   No facility-administered encounter medications on file as of 03/20/2019.      SIGNIFICANT DIAGNOSTIC EXAMS  PREVIOUS  03-09-19: 2-d echo:   1. Left ventricular ejection fraction, by visual estimation, is 65 to 70%. The left ventricle has normal function. Normal left ventricular size. There is no left ventricular hypertrophy.  2. Global right ventricle has normal systolic function.The right ventricular size is normal. No increase in right ventricular wall thickness.  3. Left atrial size was normal.  4. Right atrial size was normal.  5. The mitral valve is normal in structure. No evidence of mitral valve regurgitation. No evidence of mitral stenosis.  6. The tricuspid valve is normal in structure. Tricuspid valve regurgitation was not visualized by color flow Doppler.  7. Aortic valve mean gradient measures 54.0 mmHg.  8. Aortic valve peak gradient measures 84.6 mmHg.  9. The aortic valve is tricuspid with severely calcified leaflets. Aortic valve regurgitation was not visualized by color flow Doppler. Severe aortic valve stenosis. 10. The pulmonic valve was normal in structure. Pulmonic valve regurgitation is not visualized by color flow Doppler. 11. The inferior vena cava is normal in size with greater than 50% respiratory variability, suggesting right atrial pressure of 3 mmHg. 12. Left ventricular diastolic  Doppler parameters are consistent with impaired relaxation pattern of LV diastolic filling. 13. Elevated left ventricular end-diastolic pressure. 14. Moderate mitral annular calcification.  03-09-19: ct of left hip: Acute, mildly impacted subcapital fracture of the proximal left femur.  NO NEW EXAMS   LABS REVIEWED PREVIOUS;   03-08-19: wbc 11.1; hgb 12.8; hct 39.7; mcv 95.9; plt 241; glucose 106; bun 20; creat 1.25; k+ 4.4; na++ 141; ca 9.4; blood culture: no growth 03-11-19: wbc 8.0; hgb 8.6; hct 26.3; mcv 96.7; plt 191; glucose 97; bun 35; creat 1.27; k+ 4.4; na++ 137; ca 8.3 03-12-19: wbc 6.7; hgb 8.7; hct 26.4; mcv 96.0; plt 202; glucose 95; bun 35; creat 1.14; k+ 4.1; na++ 140; ca 8.9   TODAY  03-19-19': hgb 8.8; hct 28.9; glucose 97;  bun 31; creat 0.87; k+ 4.1; na++ 139; ca 9.0    Review of Systems  Constitutional: Negative for malaise/fatigue.  Respiratory: Negative for cough and shortness of breath.   Cardiovascular: Negative for chest pain, palpitations and leg swelling.  Gastrointestinal: Negative for abdominal pain, constipation and heartburn.  Musculoskeletal: Positive for joint pain. Negative for back pain and myalgias.       Left arm and hip pain   Skin: Negative.   Neurological: Negative for dizziness.  Psychiatric/Behavioral: The patient is not nervous/anxious.     Physical Exam Constitutional:      General: She is not in acute distress.    Appearance: She is well-developed. She is not diaphoretic.  Neck:     Musculoskeletal: Neck supple.     Thyroid: No thyromegaly.  Cardiovascular:     Rate and Rhythm: Normal rate and regular rhythm.     Pulses: Normal pulses.     Heart sounds: Normal heart sounds.  Pulmonary:     Effort: Pulmonary effort is normal. No respiratory distress.     Breath sounds: Normal breath sounds.  Abdominal:     General: Bowel sounds are normal. There is no distension.     Palpations: Abdomen is soft.     Tenderness: There is no abdominal  tenderness.  Musculoskeletal:     Right lower leg: No edema.     Left lower leg: No edema.     Comments: Left arm in sling Able to move other extremities Is status post left hip nail: 03-09-19 History of right hip nail: 2016    Lymphadenopathy:     Cervical: No cervical adenopathy.  Skin:    General: Skin is warm and dry.     Comments: Left arm bruising  Chronic painful lesion right lower extremity     Neurological:     Mental Status: She is alert and oriented to person, place, and time.  Psychiatric:        Mood and Affect: Mood normal.     ASSESSMENT/ PLAN:  PREVIOUS;   1.Other closed nondisplaced fracture of proximal of left humerus sequela/ closed fracture of neck of left femur sequela:is stable will continue therapy as directed and will follow up with orthopedics; will continue percocet 5/325 mg every 6 hours routinely for one week and will monitor  2. Essential benign hypertension; is stable b/p 120/70 will continue asa 325 mg daily and norvasc 2.5 mg daily   3. Rheumatoid arthritis involving multiple sites unspecified rheumatoid factor presence: is stable will continue relafen 500 mg twice daily as needed   PREVIOUS   4. Sequela of open wound of right lower extremity: will setup a dermatology consult pending wound care as directed; will continue juven twice daily   5. Major depression recurrent chronic: is stable will continue zoloft 50 mg daily   6. Chronic constipation: is continue  change senna  2 tabs daily   7. Acute blood loss anemia: is without change hgb 8.8 will continue to monitor her status.       MD is aware of resident's narcotic use and is in agreement with current plan of care. We will attempt to wean resident as appropriate.  Ok Edwards NP [facility] Adult Medicine  Contact [phone] Monday through Friday 8am- 5pm  After hours call [phone]

## 2019-03-22 ENCOUNTER — Inpatient Hospital Stay: Payer: Private Health Insurance - Indemnity | Admitting: Orthopaedic Surgery

## 2019-03-26 ENCOUNTER — Non-Acute Institutional Stay (SKILLED_NURSING_FACILITY): Payer: Medicare Other | Admitting: Adult Health

## 2019-03-26 ENCOUNTER — Encounter: Payer: Self-pay | Admitting: Adult Health

## 2019-03-26 DIAGNOSIS — S42292D Other displaced fracture of upper end of left humerus, subsequent encounter for fracture with routine healing: Secondary | ICD-10-CM | POA: Diagnosis not present

## 2019-03-26 DIAGNOSIS — K5909 Other constipation: Secondary | ICD-10-CM | POA: Diagnosis not present

## 2019-03-26 DIAGNOSIS — I10 Essential (primary) hypertension: Secondary | ICD-10-CM

## 2019-03-26 DIAGNOSIS — S72002D Fracture of unspecified part of neck of left femur, subsequent encounter for closed fracture with routine healing: Secondary | ICD-10-CM | POA: Diagnosis not present

## 2019-03-26 NOTE — Progress Notes (Signed)
Location:    [place] Room Number: 158/P Place of Service:  SNF (31)   CODE STATUS: Full Code  Allergies  Allergen Reactions  . Bee Pollen   . Penicillins Other (See Comments)    Child hood allergy  . Codeine Nausea And Vomiting and Rash   Chief Complaint  Patient presents with  . Medical Management of Chronic Issues         Other closed nondisplaced fracture of proximal of left humerus sequela/ closed fracture of neck of left femur sequela:    Chronic constipation:  Essential benign hypertension:    Weekly follow up for the first 30 days post hospitalization.     HPI:  She is a 85 year old short term rehab patient being seen for the management of her chronic illnesses: hypertension; left hip and left humeral fracture; constipation. She is complaining of constipation. She denies any nausea or vomiting. Her pain is managed; we have discussed her pain regimen; she willing to change her percocet to as needed basis. She is doing well with therapy.   Past Medical History:  Diagnosis Date  . Depression   . HTN (hypertension)   . Osteoporosis   . Rheumatoid arthritis [facility])     Past Surgical History:  Procedure Laterality Date  . HIP PINNING,CANNULATED Left 03/09/2019   Procedure: CANNULATED HIP PINNING;  Surgeon: Mcarthur Rossetti, MD;  Location: [place];  Service: Orthopedics;  Laterality: Left;  . INTRAMEDULLARY (IM) NAIL INTERTROCHANTERIC Right 03/14/2015   Procedure: RIGHT INTERTROCHANTRIC INTRAMEDULLARY (IM) NAIL ;  Surgeon: Mcarthur Rossetti, MD;  Location: [place];  Service: Orthopedics;  Laterality: Right;    Social History   Socioeconomic History  . Marital status: Widowed    Spouse name: Not on file  . Number of children: Not on file  . Years of education: Not on file  . Highest education level: Not on file  Occupational History  . Not on file  Social Needs  . Financial resource strain: Not hard at all  . Food insecurity    Worry:  Never true    Inability: Never true  . Transportation needs    Medical: No    Non-medical: No  Tobacco Use  . Smoking status: Never Smoker  . Smokeless tobacco: Never Used  Substance and Sexual Activity  . Alcohol use: No  . Drug use: No  . Sexual activity: Not on file  Lifestyle  . Physical activity    Days per week: Patient refused    Minutes per session: Patient refused  . Stress: Not at all  Relationships  . Social Herbalist on phone: Patient refused    Gets together: Patient refused    Attends religious service: Patient refused    Active member of club or organization: Patient refused    Attends meetings of clubs or organizations: Patient refused    Relationship status: Patient refused  . Intimate partner violence    Fear of current or ex partner: Patient refused    Emotionally abused: Patient refused    Physically abused: Patient refused    Forced sexual activity: Patient refused  Other Topics Concern  . Not on file  Social History Narrative  . Not on file   Family History  Problem Relation Age of Onset  . Cancer Sister       VITAL SIGNS BP 110/68   Pulse 68   Temp (!) 96.8 F (36 C) (Oral)  Resp 18   Ht 5\' 2"  (1.575 m)   Wt 105 lb 6.4 oz (47.8 kg)   SpO2 99%   BMI 19.28 kg/m   Outpatient Encounter Medications as of 03/26/2019  Medication Sig  . acetaminophen (TYLENOL) 325 MG tablet Take 2 tablets (650 mg total) by mouth every 6 (six) hours as needed for mild pain (or Fever >/= 101).  Marland Kitchen amLODipine (NORVASC) 2.5 MG tablet Take 2.5 mg by mouth at bedtime.  Marland Kitchen aspirin EC 325 MG tablet Take 1 tablet (325 mg total) by mouth daily.  . feeding supplement, ENSURE ENLIVE, (ENSURE ENLIVE) LIQD Take 237 mLs by mouth daily. Daily due to inadequate intake and increased needs  . Glucosamine-Chondroitin (OSTEO BI-FLEX REGULAR STRENGTH PO) Take 1 tablet by mouth daily.   . Multiple Vitamins-Minerals (MULTIVITAMIN WITH MINERALS) tablet Take 1 tablet by  mouth daily.  . nabumetone (RELAFEN) 500 MG tablet Take 500 mg by mouth 2 (two) times daily as needed for pain.  . NON FORMULARY Diet: Regular, NAS, Consistent Carbohydrate  . nutrition supplement, JUVEN, (JUVEN) PACK Take 1 packet by mouth 2 (two) times daily between meals.  Marland Kitchen oxyCODONE-acetaminophen (PERCOCET) 5-325 MG tablet Take 1 tablet by mouth every 6 (six) hours for 7 days.  Marland Kitchen senna-docusate (SENOKOT-S) 8.6-50 MG tablet Take 1 tablet by mouth at bedtime as needed for mild constipation.  . sertraline (ZOLOFT) 50 MG tablet Take 50 mg by mouth daily.   No facility-administered encounter medications on file as of 03/26/2019.      SIGNIFICANT DIAGNOSTIC EXAMS   PREVIOUS  03-09-19: 2-d echo:   1. Left ventricular ejection fraction, by visual estimation, is 65 to 70%. The left ventricle has normal function. Normal left ventricular size. There is no left ventricular hypertrophy.  2. Global right ventricle has normal systolic function.The right ventricular size is normal. No increase in right ventricular wall thickness.  3. Left atrial size was normal.  4. Right atrial size was normal.  5. The mitral valve is normal in structure. No evidence of mitral valve regurgitation. No evidence of mitral stenosis.  6. The tricuspid valve is normal in structure. Tricuspid valve regurgitation was not visualized by color flow Doppler.  7. Aortic valve mean gradient measures 54.0 mmHg.  8. Aortic valve peak gradient measures 84.6 mmHg.  9. The aortic valve is tricuspid with severely calcified leaflets. Aortic valve regurgitation was not visualized by color flow Doppler. Severe aortic valve stenosis. 10. The pulmonic valve was normal in structure. Pulmonic valve regurgitation is not visualized by color flow Doppler. 11. The inferior vena cava is normal in size with greater than 50% respiratory variability, suggesting right atrial pressure of 3 mmHg. 12. Left ventricular diastolic Doppler parameters are  consistent with impaired relaxation pattern of LV diastolic filling. 13. Elevated left ventricular end-diastolic pressure. 14. Moderate mitral annular calcification.  03-09-19: ct of left hip: Acute, mildly impacted subcapital fracture of the proximal left femur.  NO NEW EXAMS   LABS REVIEWED PREVIOUS;   03-08-19: wbc 11.1; hgb 12.8; hct 39.7; mcv 95.9; plt 241; glucose 106; bun 20; creat 1.25; k+ 4.4; na++ 141; ca 9.4; blood culture: no growth 03-11-19: wbc 8.0; hgb 8.6; hct 26.3; mcv 96.7; plt 191; glucose 97; bun 35; creat 1.27; k+ 4.4; na++ 137; ca 8.3 03-12-19: wbc 6.7; hgb 8.7; hct 26.4; mcv 96.0; plt 202; glucose 95; bun 35; creat 1.14; k+ 4.1; na++ 140; ca 8.9  03-19-19': hgb 8.8; hct 28.9; glucose 97; bun 31; creat 0.87;  k+ 4.1; na++ 139; ca 9.0   NO NEW LABS.    Review of Systems  Constitutional: Negative for malaise/fatigue.  Respiratory: Negative for cough and shortness of breath.   Cardiovascular: Negative for chest pain, palpitations and leg swelling.  Gastrointestinal: Positive for constipation. Negative for abdominal pain and heartburn.  Musculoskeletal: Positive for joint pain. Negative for back pain and myalgias.       Left hip and arm pain managed   Skin: Negative.   Neurological: Negative for dizziness.  Psychiatric/Behavioral: The patient is not nervous/anxious.     Physical Exam Constitutional:      General: She is not in acute distress.    Appearance: She is well-developed. She is not diaphoretic.  Neck:     Musculoskeletal: Neck supple.     Thyroid: No thyromegaly.  Cardiovascular:     Rate and Rhythm: Normal rate and regular rhythm.     Pulses: Normal pulses.     Heart sounds: Normal heart sounds.  Pulmonary:     Effort: Pulmonary effort is normal. No respiratory distress.     Breath sounds: Normal breath sounds.  Abdominal:     General: Bowel sounds are normal. There is no distension.     Palpations: Abdomen is soft.     Tenderness: There is no  abdominal tenderness.  Musculoskeletal:     Right lower leg: No edema.     Left lower leg: No edema.     Comments: Left arm in sling Able to move other extremities Is status post left hip nail: 03-09-19 History of right hip nail: 2016     Lymphadenopathy:     Cervical: No cervical adenopathy.  Skin:    General: Skin is warm and dry.     Comments: Left arm bruising  Chronic painful lesion right lower extremity     Incision line without signs of infection present   Neurological:     Mental Status: She is alert and oriented to person, place, and time.  Psychiatric:        Mood and Affect: Mood normal.       ASSESSMENT/ PLAN:  PREVIOUS;   1.Other closed nondisplaced fracture of proximal of left humerus sequela/ closed fracture of neck of left femur sequela: is stable will continue therapy as directed and will follow up with orthopedics as indicated. Will change her percocet 5/325 mg every 6 hours as needed through 04-02-19 will monitor her status.   2. Chronic constipation: is worse; will give dulcolax suppository now; will change to: senna s 2 tabs twice daily and miralax daily   3. Essential benign hypertension: is stable b/p  110/68 will continue norvasc 2.5 mg daily and asa 325 mg daily   PREVIOUS   4. Sequela of open wound of right lower extremity: will setup a dermatology consult pending wound care as directed; will continue juven twice daily   5. Major depression recurrent chronic: is stable will continue zoloft 50 mg daily   6. Acute blood loss anemia: is without change hgb 8.8 will continue to monitor her status.   7. Rheumatoid arthritis involving multiple sites unspecified rheumatoid factor presence: is stable will continue relafen 500 mg twice daily as needed         MD is aware of resident's narcotic use and is in agreement with current plan of care. We will attempt to wean resident as appropriate.  Ok Edwards NP [facility] Adult Medicine  Contact  [phone] Monday through Friday 8am- 5pm  After  hours call [phone]

## 2019-03-28 ENCOUNTER — Inpatient Hospital Stay (INDEPENDENT_AMBULATORY_CARE_PROVIDER_SITE_OTHER): Payer: Medicare Other

## 2019-03-28 ENCOUNTER — Encounter: Payer: Self-pay | Admitting: Orthopaedic Surgery

## 2019-03-28 ENCOUNTER — Ambulatory Visit (INDEPENDENT_AMBULATORY_CARE_PROVIDER_SITE_OTHER): Payer: Medicare Other | Admitting: Orthopaedic Surgery

## 2019-03-28 DIAGNOSIS — M25512 Pain in left shoulder: Secondary | ICD-10-CM

## 2019-03-28 DIAGNOSIS — S42292D Other displaced fracture of upper end of left humerus, subsequent encounter for fracture with routine healing: Secondary | ICD-10-CM

## 2019-03-28 DIAGNOSIS — S72002D Fracture of unspecified part of neck of left femur, subsequent encounter for closed fracture with routine healing: Secondary | ICD-10-CM

## 2019-03-28 NOTE — Progress Notes (Signed)
The patient is an 85 year old female worsening in follow-up 19 days status post cannulated screw fixation of a minimally displaced left hip femoral neck fracture.  She also has a left shoulder proximal humerus fracture.  She is in a sling for the left shoulder.  We have had her just touchdown weightbearing on the right hip.  She is recovering at a nursing care facility.  She is still real sore but she is walking with a cane and walker on occasion and feels like she is making some progress.  On examination of her left hip her incisions healed nicely so the staples were removed.  I did put her through some gentle internal and external rotation and she does exhibit some pain of the left hip and groin area.  Clinically her left shoulder is well located and there is some pain with internal and external rotation of the left shoulder.  I did not put her through abduction.  2 views of the left shoulder obtained and show that the shoulder is well located.  It is at least a 3 to 4 part proximal humerus fracture that is in adequate position for long-term treatment given her age of 21.  This should heal with no significant issues other than the family understands that there will be limitations likely with deficits of the rotator cuff and with overhead activities time.  X-rays of the left hip show the cannulated screws are in place.  I am concerned that one screw may have backed out just slightly but overall alignment is well-maintained.  Family understands that there is a risk of the cannulated screw fixation failing and that we would need to proceed with a hip replacement if that is the case.  I would like to see her back in 4 weeks after continued time with weightbearing as tolerated and at that visit I would like to have 2 views of the left hip only.  We do not need to see the pelvis.  This can be done supine.  We will also have internal X rotation views of the left shoulder.  I gave instructions to the nursing care  facility to have her come out of the sling to work on rotation of the shoulder but no abduction.  They will also allow her to continue attempts to weight-bear as tolerated on the left hip.  All questions and concerns were otherwise answered and addressed.

## 2019-03-29 DIAGNOSIS — M1990 Unspecified osteoarthritis, unspecified site: Secondary | ICD-10-CM | POA: Diagnosis not present

## 2019-03-29 DIAGNOSIS — F329 Major depressive disorder, single episode, unspecified: Secondary | ICD-10-CM | POA: Diagnosis not present

## 2019-03-29 DIAGNOSIS — S81801A Unspecified open wound, right lower leg, initial encounter: Secondary | ICD-10-CM | POA: Diagnosis not present

## 2019-03-29 DIAGNOSIS — S81801D Unspecified open wound, right lower leg, subsequent encounter: Secondary | ICD-10-CM | POA: Diagnosis not present

## 2019-03-29 DIAGNOSIS — Z79899 Other long term (current) drug therapy: Secondary | ICD-10-CM | POA: Diagnosis not present

## 2019-03-29 DIAGNOSIS — I10 Essential (primary) hypertension: Secondary | ICD-10-CM | POA: Diagnosis not present

## 2019-03-29 DIAGNOSIS — M810 Age-related osteoporosis without current pathological fracture: Secondary | ICD-10-CM | POA: Diagnosis not present

## 2019-04-02 ENCOUNTER — Other Ambulatory Visit: Payer: Self-pay | Admitting: Adult Health

## 2019-04-02 ENCOUNTER — Encounter: Payer: Self-pay | Admitting: Adult Health

## 2019-04-02 ENCOUNTER — Non-Acute Institutional Stay (SKILLED_NURSING_FACILITY): Payer: Medicare Other | Admitting: Adult Health

## 2019-04-02 ENCOUNTER — Ambulatory Visit: Payer: Private Health Insurance - Indemnity | Admitting: Cardiovascular Disease

## 2019-04-02 DIAGNOSIS — S72002D Fracture of unspecified part of neck of left femur, subsequent encounter for closed fracture with routine healing: Secondary | ICD-10-CM

## 2019-04-02 DIAGNOSIS — D62 Acute posthemorrhagic anemia: Secondary | ICD-10-CM | POA: Diagnosis not present

## 2019-04-02 DIAGNOSIS — S42292D Other displaced fracture of upper end of left humerus, subsequent encounter for fracture with routine healing: Secondary | ICD-10-CM

## 2019-04-02 DIAGNOSIS — F339 Major depressive disorder, recurrent, unspecified: Secondary | ICD-10-CM | POA: Diagnosis not present

## 2019-04-02 DIAGNOSIS — M79661 Pain in right lower leg: Secondary | ICD-10-CM | POA: Diagnosis not present

## 2019-04-02 DIAGNOSIS — R600 Localized edema: Secondary | ICD-10-CM | POA: Diagnosis not present

## 2019-04-02 DIAGNOSIS — M7989 Other specified soft tissue disorders: Secondary | ICD-10-CM | POA: Diagnosis not present

## 2019-04-02 MED ORDER — OXYCODONE-ACETAMINOPHEN 5-325 MG PO TABS: 1.0000 | ORAL_TABLET | Freq: Four times a day (QID) | ORAL | 0 refills | Status: DC | PRN

## 2019-04-02 NOTE — Progress Notes (Signed)
Location:    [place] Room Number: 136/P Place of Service:  SNF (31)   CODE STATUS: Full Code  Allergies  Allergen Reactions  . Bee Pollen   . Penicillins Other (See Comments)    Child hood allergy  . Codeine Nausea And Vomiting and Rash   Chief Complaint  Patient presents with  . Medical Management of Chronic Issues         Other closed nondisplaced fracture of proximal of left humerus sequela/ closed fracture of neck of left femur sequela:  Major depression recurrent chronic: Acute blood loss anemia:  Weekly follow up for the first 30 days post hospitalization.      HPI:  She is a 85 year old short term rehab patient seen for the management of her chronic illnesses; left humerus; left femur fracture; depression; anemia. She denies any uncontrolled pain; no excessive fatigue. No changes in her appetite; no insomnia.   Past Medical History:  Diagnosis Date  . Depression   . HTN (hypertension)   . Osteoporosis   . Rheumatoid arthritis [facility])     Past Surgical History:  Procedure Laterality Date  . HIP PINNING,CANNULATED Left 03/09/2019   Procedure: CANNULATED HIP PINNING;  Surgeon: Mcarthur Rossetti, MD;  Location: [place];  Service: Orthopedics;  Laterality: Left;  . INTRAMEDULLARY (IM) NAIL INTERTROCHANTERIC Right 03/14/2015   Procedure: RIGHT INTERTROCHANTRIC INTRAMEDULLARY (IM) NAIL ;  Surgeon: Mcarthur Rossetti, MD;  Location: [place];  Service: Orthopedics;  Laterality: Right;    Social History   Socioeconomic History  . Marital status: Widowed    Spouse name: Not on file  . Number of children: Not on file  . Years of education: Not on file  . Highest education level: Not on file  Occupational History  . Not on file  Social Needs  . Financial resource strain: Not hard at all  . Food insecurity    Worry: Never true    Inability: Never true  . Transportation needs    Medical: No    Non-medical: No  Tobacco Use  . Smoking  status: Never Smoker  . Smokeless tobacco: Never Used  Substance and Sexual Activity  . Alcohol use: No  . Drug use: No  . Sexual activity: Not on file  Lifestyle  . Physical activity    Days per week: Patient refused    Minutes per session: Patient refused  . Stress: Not at all  Relationships  . Social Herbalist on phone: Patient refused    Gets together: Patient refused    Attends religious service: Patient refused    Active member of club or organization: Patient refused    Attends meetings of clubs or organizations: Patient refused    Relationship status: Patient refused  . Intimate partner violence    Fear of current or ex partner: Patient refused    Emotionally abused: Patient refused    Physically abused: Patient refused    Forced sexual activity: Patient refused  Other Topics Concern  . Not on file  Social History Narrative  . Not on file   Family History  Problem Relation Age of Onset  . Cancer Sister       VITAL SIGNS BP (!) 112/59   Pulse 79   Temp (!) 96.7 F (35.9 C) (Oral)   Resp 16   Ht 5\' 2"  (1.575 m)   Wt 108 lb 4.8 oz (49.1 kg)   SpO2 99%  BMI 19.81 kg/m   Outpatient Encounter Medications as of 04/02/2019  Medication Sig  . acetaminophen (TYLENOL) 325 MG tablet Take 2 tablets (650 mg total) by mouth every 6 (six) hours as needed for mild pain (or Fever >/= 101).  Marland Kitchen amLODipine (NORVASC) 2.5 MG tablet Take 2.5 mg by mouth at bedtime.  Marland Kitchen aspirin EC 325 MG tablet Take 1 tablet (325 mg total) by mouth daily.  . feeding supplement, ENSURE ENLIVE, (ENSURE ENLIVE) LIQD Take 237 mLs by mouth daily. Daily due to inadequate intake and increased needs  . Glucosamine-Chondroitin (OSTEO BI-FLEX REGULAR STRENGTH PO) Take 1 tablet by mouth daily.   . meclizine (ANTIVERT) 12.5 MG tablet Take 12.5 mg by mouth daily as needed for dizziness.  . Multiple Vitamins-Minerals (MULTIVITAMIN WITH MINERALS) tablet Take 1 tablet by mouth daily.  . NON  FORMULARY Diet: Regular, NAS, Consistent Carbohydrate  . nutrition supplement, JUVEN, (JUVEN) PACK Take 1 packet by mouth 2 (two) times daily between meals.  Marland Kitchen oxyCODONE-acetaminophen (PERCOCET/ROXICET) 5-325 MG tablet Take 1 tablet by mouth every 6 (six) hours as needed for severe pain.  . polyethylene glycol (MIRALAX / GLYCOLAX) 17 g packet Take 17 g by mouth daily.  Marland Kitchen senna-docusate (SENOKOT-S) 8.6-50 MG tablet Take 2 tablets by mouth 2 (two) times daily.  . sertraline (ZOLOFT) 50 MG tablet Take 50 mg by mouth daily.  Cathie Olden (SILVASORB) GEL Gel,extended release; - ; amt: small amount; topical  Special Instructions: Cleanse wound to RLE with NS. Apply SilvaSorb, 4x4 and wrap in kling and ace wrap. Change M, W, F Once A Day on Mon, Wed, Fri 07:15 AM - 03:15 PM  . [DISCONTINUED] nabumetone (RELAFEN) 500 MG tablet Take 500 mg by mouth 2 (two) times daily as needed for pain.  . [DISCONTINUED] senna-docusate (SENOKOT-S) 8.6-50 MG tablet Take 1 tablet by mouth at bedtime as needed for mild constipation. (Patient taking differently: Take 2 tablets by mouth 2 (two) times daily. )   No facility-administered encounter medications on file as of 04/02/2019.      SIGNIFICANT DIAGNOSTIC EXAMS    PREVIOUS  03-09-19: 2-d echo:   1. Left ventricular ejection fraction, by visual estimation, is 65 to 70%. The left ventricle has normal function. Normal left ventricular size. There is no left ventricular hypertrophy.  2. Global right ventricle has normal systolic function.The right ventricular size is normal. No increase in right ventricular wall thickness.  3. Left atrial size was normal.  4. Right atrial size was normal.  5. The mitral valve is normal in structure. No evidence of mitral valve regurgitation. No evidence of mitral stenosis.  6. The tricuspid valve is normal in structure. Tricuspid valve regurgitation was not visualized by color flow Doppler.  7. Aortic valve mean gradient measures 54.0  mmHg.  8. Aortic valve peak gradient measures 84.6 mmHg.  9. The aortic valve is tricuspid with severely calcified leaflets. Aortic valve regurgitation was not visualized by color flow Doppler. Severe aortic valve stenosis. 10. The pulmonic valve was normal in structure. Pulmonic valve regurgitation is not visualized by color flow Doppler. 11. The inferior vena cava is normal in size with greater than 50% respiratory variability, suggesting right atrial pressure of 3 mmHg. 12. Left ventricular diastolic Doppler parameters are consistent with impaired relaxation pattern of LV diastolic filling. 13. Elevated left ventricular end-diastolic pressure. 14. Moderate mitral annular calcification.  03-09-19: ct of left hip: Acute, mildly impacted subcapital fracture of the proximal left femur.  NO NEW EXAMS  LABS REVIEWED PREVIOUS;   03-08-19: wbc 11.1; hgb 12.8; hct 39.7; mcv 95.9; plt 241; glucose 106; bun 20; creat 1.25; k+ 4.4; na++ 141; ca 9.4; blood culture: no growth 03-11-19: wbc 8.0; hgb 8.6; hct 26.3; mcv 96.7; plt 191; glucose 97; bun 35; creat 1.27; k+ 4.4; na++ 137; ca 8.3 03-12-19: wbc 6.7; hgb 8.7; hct 26.4; mcv 96.0; plt 202; glucose 95; bun 35; creat 1.14; k+ 4.1; na++ 140; ca 8.9  03-19-19': hgb 8.8; hct 28.9; glucose 97; bun 31; creat 0.87; k+ 4.1; na++ 139; ca 9.0   NO NEW LABS.    Review of Systems  Constitutional: Negative for malaise/fatigue.  Respiratory: Negative for cough and shortness of breath.   Cardiovascular: Negative for chest pain, palpitations and leg swelling.  Gastrointestinal: Negative for abdominal pain, constipation and heartburn.  Musculoskeletal: Negative for back pain, joint pain and myalgias.  Skin: Negative.   Neurological: Negative for dizziness.  Psychiatric/Behavioral: The patient is not nervous/anxious.     Physical Exam Constitutional:      General: She is not in acute distress.    Appearance: She is well-developed. She is not diaphoretic.   Neck:     Musculoskeletal: Neck supple.     Thyroid: No thyromegaly.  Cardiovascular:     Rate and Rhythm: Normal rate and regular rhythm.     Pulses: Normal pulses.     Heart sounds: Normal heart sounds.  Pulmonary:     Effort: Pulmonary effort is normal. No respiratory distress.     Breath sounds: Normal breath sounds.  Abdominal:     General: Bowel sounds are normal. There is no distension.     Palpations: Abdomen is soft.     Tenderness: There is no abdominal tenderness.  Musculoskeletal:     Right lower leg: No edema.     Left lower leg: No edema.     Comments: Left arm in sling Able to move other extremities Is status post left hip nail: 03-09-19 History of right hip nail: 2016      Lymphadenopathy:     Cervical: No cervical adenopathy.  Skin:    General: Skin is warm and dry.     Comments:  Left arm bruising  Chronic painful lesion right lower extremity     Incision line without signs of infection present    Neurological:     Mental Status: She is alert and oriented to person, place, and time.  Psychiatric:        Mood and Affect: Mood normal.        ASSESSMENT/ PLAN:  PREVIOUS;   1. Other closed nondisplaced fracture of proximal of left humerus sequela/ closed fracture of neck of left femur sequela: is stable will continue therapy as directed; will follow up with orthopedics; will continue percocet 5/325 mg every 6 hours as needed through 04-06-19.   2. Major depression recurrent chronic: is stable will continue zoloft 50 mg daily   3. Acute blood loss anemia: stable hgb 8.8 will monitor   PREVIOUS   4. Sequela of open wound of right lower extremity: will setup a dermatology consult pending wound care as directed; will continue juven twice daily   5. Rheumatoid arthritis involving multiple sites unspecified rheumatoid factor presence: is stable will monitor   6. Chronic constipation: is stable will continue miralax daily and senna s 2 tabs twice daily    7. Essential benign hypertension: is stable b/p  115/59 will continue norvasc 2.5 mg daily and asa 325  mg daily      MD is aware of resident's narcotic use and is in agreement with current plan of care. We will attempt to wean resident as appropriate.  Ok Edwards NP [facility]  Contact [phone] Monday through Friday 8am- 5pm  After hours call [phone]

## 2019-04-03 ENCOUNTER — Other Ambulatory Visit: Payer: Self-pay | Admitting: *Deleted

## 2019-04-03 NOTE — Patient Outreach (Signed)
Member assessed for potential [facility] Care Management needs as a benefit of  [place] [employer].  Member is currently receiving rehab therapy at [facility].  Member discussed in weekly telephonic IDT meeting with facility staff, [facility] UM team, and writer.  Facility reports member's likely discharge date will be this Saturday. Member will have caregiver and family support. Does not appear Mrs. Cintora has any identifiable [facility] Care Management needs at this time.  Will continue to collaborate with facility on member while she remains in SNF.  Marthenia Rolling, MSN-Ed, RN,BSN [place] Acute Care Coordinator [phone] [facility]) [phone]  (Toll free office)

## 2019-04-05 ENCOUNTER — Non-Acute Institutional Stay (SKILLED_NURSING_FACILITY): Payer: Medicare Other | Admitting: Adult Health

## 2019-04-05 ENCOUNTER — Other Ambulatory Visit: Payer: Self-pay | Admitting: Adult Health

## 2019-04-05 ENCOUNTER — Encounter: Payer: Self-pay | Admitting: Adult Health

## 2019-04-05 DIAGNOSIS — S72002D Fracture of unspecified part of neck of left femur, subsequent encounter for closed fracture with routine healing: Secondary | ICD-10-CM

## 2019-04-05 DIAGNOSIS — M069 Rheumatoid arthritis, unspecified: Secondary | ICD-10-CM | POA: Diagnosis not present

## 2019-04-05 DIAGNOSIS — M7989 Other specified soft tissue disorders: Secondary | ICD-10-CM | POA: Diagnosis not present

## 2019-04-05 DIAGNOSIS — S42292D Other displaced fracture of upper end of left humerus, subsequent encounter for fracture with routine healing: Secondary | ICD-10-CM | POA: Diagnosis not present

## 2019-04-05 DIAGNOSIS — I743 Embolism and thrombosis of arteries of the lower extremities: Secondary | ICD-10-CM | POA: Diagnosis not present

## 2019-04-05 DIAGNOSIS — M79604 Pain in right leg: Secondary | ICD-10-CM | POA: Diagnosis not present

## 2019-04-05 MED ORDER — SERTRALINE HCL 50 MG PO TABS: 50.0000 mg | ORAL_TABLET | Freq: Every day | ORAL | 0 refills | Status: DC

## 2019-04-05 MED ORDER — AMLODIPINE BESYLATE 2.5 MG PO TABS: 2.5000 mg | ORAL_TABLET | Freq: Every day | ORAL | 0 refills | Status: DC

## 2019-04-05 MED ORDER — OXYCODONE-ACETAMINOPHEN 5-325 MG PO TABS: 1.0000 | ORAL_TABLET | Freq: Four times a day (QID) | ORAL | 0 refills | Status: AC | PRN

## 2019-04-05 MED ORDER — MECLIZINE HCL 12.5 MG PO TABS: 12.5000 mg | ORAL_TABLET | Freq: Every day | ORAL | 0 refills | Status: DC | PRN

## 2019-04-05 MED ORDER — JUVEN PO PACK: 1.0000 | PACK | Freq: Two times a day (BID) | ORAL | 0 refills | Status: DC

## 2019-04-05 NOTE — Progress Notes (Signed)
Location:    [place] Room Number: 136/P Place of Service:  SNF (31)    CODE STATUS: Full Code  Allergies  Allergen Reactions   Bee Pollen    Penicillins Other (See Comments)    Child hood allergy   Codeine Nausea And Vomiting and Rash    Chief Complaint  Patient presents with   Discharge Note    Discharge Visit    HPI:  She is being discharged to home with home health for pt/ot. She will need a bedside commode. She will need her prescriptions written and will need to follow up with her medical provider. She had been hospitalized for a left hip and left humeral fracture. She was admitted to this facility for short term rehab. She did participate with pt and ot. She is now ready to complete her therapy on a home health basis.    Past Medical History:  Diagnosis Date   Depression    HTN (hypertension)    Osteoporosis    Rheumatoid arthritis [facility])     Past Surgical History:  Procedure Laterality Date   HIP PINNING,CANNULATED Left 03/09/2019   Procedure: CANNULATED HIP PINNING;  Surgeon: Mcarthur Rossetti, MD;  Location: [place];  Service: Orthopedics;  Laterality: Left;   INTRAMEDULLARY (IM) NAIL INTERTROCHANTERIC Right 03/14/2015   Procedure: RIGHT INTERTROCHANTRIC INTRAMEDULLARY (IM) NAIL ;  Surgeon: Mcarthur Rossetti, MD;  Location: [place];  Service: Orthopedics;  Laterality: Right;    Social History   Socioeconomic History   Marital status: Widowed    Spouse name: Not on file   Number of children: Not on file   Years of education: Not on file   Highest education level: Not on file  Occupational History   Not on file  Social Needs   Financial resource strain: Not hard at all   Food insecurity    Worry: Never true    Inability: Never true   Transportation needs    Medical: No    Non-medical: No  Tobacco Use   Smoking status: Never Smoker   Smokeless tobacco: Never Used  Substance and Sexual Activity    Alcohol use: No   Drug use: No   Sexual activity: Not on file  Lifestyle   Physical activity    Days per week: Patient refused    Minutes per session: Patient refused   Stress: Not at all  Relationships   Social connections    Talks on phone: Patient refused    Gets together: Patient refused    Attends religious service: Patient refused    Active member of club or organization: Patient refused    Attends meetings of clubs or organizations: Patient refused    Relationship status: Patient refused   Intimate partner violence    Fear of current or ex partner: Patient refused    Emotionally abused: Patient refused    Physically abused: Patient refused    Forced sexual activity: Patient refused  Other Topics Concern   Not on file  Social History Narrative   Not on file   Family History  Problem Relation Age of Onset   Cancer Sister     VITAL SIGNS BP 128/69    Pulse 79    Temp 98 F (36.7 C) (Oral)    Resp 16    Ht 5\' 2"  (1.575 m)    Wt 104 lb (47.2 kg)    SpO2 99%    BMI 19.02 kg/m   Patient's Medications  New Prescriptions   No medications on file  Previous Medications   ACETAMINOPHEN (TYLENOL) 325 MG TABLET    Take 2 tablets (650 mg total) by mouth every 6 (six) hours as needed for mild pain (or Fever >/= 101).   AMLODIPINE (NORVASC) 2.5 MG TABLET    Take 1 tablet (2.5 mg total) by mouth at bedtime.   ASPIRIN EC 325 MG TABLET    Take 1 tablet (325 mg total) by mouth daily.   FEEDING SUPPLEMENT, ENSURE ENLIVE, (ENSURE ENLIVE) LIQD    Take 237 mLs by mouth daily. Daily due to inadequate intake and increased needs   GLUCOSAMINE-CHONDROITIN (OSTEO BI-FLEX REGULAR STRENGTH PO)    Take 1 tablet by mouth daily.    MECLIZINE (ANTIVERT) 12.5 MG TABLET    Take 1 tablet (12.5 mg total) by mouth daily as needed for dizziness.   MULTIPLE VITAMINS-MINERALS (MULTIVITAMIN WITH MINERALS) TABLET    Take 1 tablet by mouth daily.   NON FORMULARY    Diet: Regular, NAS, Consistent  Carbohydrate   NUTRITION SUPPLEMENT, JUVEN, (JUVEN) PACK    Take 1 packet by mouth 2 (two) times daily between meals.   NUTRITIONAL SUPPLEMENTS (GLUCERNA PO)    Twice A Day Between Meals   OXYCODONE-ACETAMINOPHEN (PERCOCET/ROXICET) 5-325 MG TABLET    Take 1 tablet by mouth every 6 (six) hours as needed for up to 4 days for severe pain.   POLYETHYLENE GLYCOL (MIRALAX / GLYCOLAX) 17 G PACKET    Take 17 g by mouth daily.   SENNA-DOCUSATE (SENOKOT-S) 8.6-50 MG TABLET    Take 2 tablets by mouth 2 (two) times daily.   SERTRALINE (ZOLOFT) 100 MG TABLET    Take 100 mg by mouth daily.   SILVER (SILVASORB) GEL    Gel,extended release; - ; amt: small amount; topical  Special Instructions: Cleanse wound to RLE with NS. Apply SilvaSorb, 4x4 and wrap in kling and ace wrap. Change M, W, F Once A Day on Mon, Wed, Fri 07:15 AM - 03:15 PM  Modified Medications   No medications on file  Discontinued Medications   SERTRALINE (ZOLOFT) 50 MG TABLET    Take 1 tablet (50 mg total) by mouth daily.     SIGNIFICANT DIAGNOSTIC EXAMS   PREVIOUS  03-09-19: 2-d echo:   1. Left ventricular ejection fraction, by visual estimation, is 65 to 70%. The left ventricle has normal function. Normal left ventricular size. There is no left ventricular hypertrophy.  2. Global right ventricle has normal systolic function.The right ventricular size is normal. No increase in right ventricular wall thickness.  3. Left atrial size was normal.  4. Right atrial size was normal.  5. The mitral valve is normal in structure. No evidence of mitral valve regurgitation. No evidence of mitral stenosis.  6. The tricuspid valve is normal in structure. Tricuspid valve regurgitation was not visualized by color flow Doppler.  7. Aortic valve mean gradient measures 54.0 mmHg.  8. Aortic valve peak gradient measures 84.6 mmHg.  9. The aortic valve is tricuspid with severely calcified leaflets. Aortic valve regurgitation was not visualized by color  flow Doppler. Severe aortic valve stenosis. 10. The pulmonic valve was normal in structure. Pulmonic valve regurgitation is not visualized by color flow Doppler. 11. The inferior vena cava is normal in size with greater than 50% respiratory variability, suggesting right atrial pressure of 3 mmHg. 12. Left ventricular diastolic Doppler parameters are consistent with impaired relaxation pattern of LV diastolic filling. 13. Elevated left ventricular  end-diastolic pressure. 14. Moderate mitral annular calcification.  03-09-19: ct of left hip: Acute, mildly impacted subcapital fracture of the proximal left femur.  NO NEW EXAMS   LABS REVIEWED PREVIOUS;   03-08-19: wbc 11.1; hgb 12.8; hct 39.7; mcv 95.9; plt 241; glucose 106; bun 20; creat 1.25; k+ 4.4; na++ 141; ca 9.4; blood culture: no growth 03-11-19: wbc 8.0; hgb 8.6; hct 26.3; mcv 96.7; plt 191; glucose 97; bun 35; creat 1.27; k+ 4.4; na++ 137; ca 8.3 03-12-19: wbc 6.7; hgb 8.7; hct 26.4; mcv 96.0; plt 202; glucose 95; bun 35; creat 1.14; k+ 4.1; na++ 140; ca 8.9  03-19-19': hgb 8.8; hct 28.9; glucose 97; bun 31; creat 0.87; k+ 4.1; na++ 139; ca 9.0   NO NEW LABS.    Review of Systems  Constitutional: Negative for malaise/fatigue.  Respiratory: Negative for cough and shortness of breath.   Cardiovascular: Negative for chest pain, palpitations and leg swelling.  Gastrointestinal: Negative for abdominal pain, constipation and heartburn.  Musculoskeletal: Negative for back pain, joint pain and myalgias.  Skin: Negative.   Neurological: Negative for dizziness.  Psychiatric/Behavioral: The patient is not nervous/anxious.    Physical Exam Constitutional:      General: She is not in acute distress.    Appearance: She is well-developed. She is not diaphoretic.  Neck:     Musculoskeletal: Neck supple.     Thyroid: No thyromegaly.  Cardiovascular:     Rate and Rhythm: Normal rate and regular rhythm.     Pulses: Normal pulses.     Heart  sounds: Normal heart sounds.  Pulmonary:     Effort: Pulmonary effort is normal. No respiratory distress.     Breath sounds: Normal breath sounds.  Abdominal:     General: Bowel sounds are normal. There is no distension.     Palpations: Abdomen is soft.     Tenderness: There is no abdominal tenderness.  Musculoskeletal:     Right lower leg: No edema.     Left lower leg: No edema.     Comments: Left arm in sling Able to move other extremities Is status post left hip nail: 03-09-19 History of right hip nail: 2016       Lymphadenopathy:     Cervical: No cervical adenopathy.  Skin:    General: Skin is warm and dry.     Comments: Left arm bruising  Chronic painful lesion right lower extremity     Incision line without signs of infection present     Neurological:     Mental Status: She is alert and oriented to person, place, and time.  Psychiatric:        Mood and Affect: Mood normal.     ASSESSMENT/ PLAN:  Patient is being discharged with the following home health services:  Pt/ot to evaluate and treat as indicated for gait balance strength adl training.   Patient is being discharged with the following durable medical equipment:  Bedside comoode  Patient has been advised to f/u with their PCP in 1-2 weeks to bring them up to date on their rehab stay.  Social services at facility was responsible for arranging this appointment.  Pt was provided with a 30 day supply of prescriptions for medications and refills must be obtained from their PCP.  For controlled substances, a more limited supply may be provided adequate until PCP appointment only.  A 30 day supply of her prescription medications have been sent to [facility] on [address]   Time spent  with patient 35 minutes: medications; dme; home health.   Ok Edwards NP [facility]  Contact [phone] Monday through Friday 8am- 5pm  After hours call [phone]

## 2019-04-09 ENCOUNTER — Telehealth: Payer: Self-pay | Admitting: Radiology

## 2019-04-09 DIAGNOSIS — I10 Essential (primary) hypertension: Secondary | ICD-10-CM | POA: Diagnosis not present

## 2019-04-09 DIAGNOSIS — F329 Major depressive disorder, single episode, unspecified: Secondary | ICD-10-CM | POA: Diagnosis not present

## 2019-04-09 DIAGNOSIS — Z7982 Long term (current) use of aspirin: Secondary | ICD-10-CM | POA: Diagnosis not present

## 2019-04-09 DIAGNOSIS — M810 Age-related osteoporosis without current pathological fracture: Secondary | ICD-10-CM | POA: Diagnosis not present

## 2019-04-09 DIAGNOSIS — S72002D Fracture of unspecified part of neck of left femur, subsequent encounter for closed fracture with routine healing: Secondary | ICD-10-CM | POA: Diagnosis not present

## 2019-04-09 DIAGNOSIS — S42302D Unspecified fracture of shaft of humerus, left arm, subsequent encounter for fracture with routine healing: Secondary | ICD-10-CM | POA: Diagnosis not present

## 2019-04-09 DIAGNOSIS — W19XXXD Unspecified fall, subsequent encounter: Secondary | ICD-10-CM | POA: Diagnosis not present

## 2019-04-09 DIAGNOSIS — Z9181 History of falling: Secondary | ICD-10-CM | POA: Diagnosis not present

## 2019-04-09 NOTE — Telephone Encounter (Signed)
Please advise 

## 2019-04-09 NOTE — Telephone Encounter (Signed)
LMOM of the below message for [facility]

## 2019-04-09 NOTE — Telephone Encounter (Signed)
Kat, OT with [facility] requests verbal order for OT 2x week x 4 weeks and 1x week x 2 weeks. She would also like to know what Dr. Ninfa Linden would like for her to do in regards to left humerus fracture. PROM, AAROM, pendulum exercises?  Are there any restrictions for patient?  Please call Kat at [phone] to advise.

## 2019-04-09 NOTE — Telephone Encounter (Signed)
They can have that verbal order for OT twice a week for 4 weeks and then once a week for 2 weeks.  As far as her proximal humerus fracture.  They can work on passive range of motion and pendulum exercises and some active motion but no abduction past 90 degrees.

## 2019-04-10 DIAGNOSIS — I10 Essential (primary) hypertension: Secondary | ICD-10-CM | POA: Diagnosis not present

## 2019-04-10 DIAGNOSIS — Z79899 Other long term (current) drug therapy: Secondary | ICD-10-CM | POA: Diagnosis not present

## 2019-04-10 DIAGNOSIS — M810 Age-related osteoporosis without current pathological fracture: Secondary | ICD-10-CM | POA: Diagnosis not present

## 2019-04-10 DIAGNOSIS — M1990 Unspecified osteoarthritis, unspecified site: Secondary | ICD-10-CM | POA: Diagnosis not present

## 2019-04-10 DIAGNOSIS — S81801D Unspecified open wound, right lower leg, subsequent encounter: Secondary | ICD-10-CM | POA: Diagnosis not present

## 2019-04-10 DIAGNOSIS — F329 Major depressive disorder, single episode, unspecified: Secondary | ICD-10-CM | POA: Diagnosis not present

## 2019-04-12 ENCOUNTER — Ambulatory Visit (INDEPENDENT_AMBULATORY_CARE_PROVIDER_SITE_OTHER): Payer: Medicare Other | Admitting: Cardiovascular Disease

## 2019-04-12 ENCOUNTER — Other Ambulatory Visit: Payer: Self-pay

## 2019-04-12 VITALS — BP 106/62 | HR 77 | Ht 62.0 in | Wt 105.0 lb

## 2019-04-12 DIAGNOSIS — I350 Nonrheumatic aortic (valve) stenosis: Secondary | ICD-10-CM

## 2019-04-12 DIAGNOSIS — Z79899 Other long term (current) drug therapy: Secondary | ICD-10-CM | POA: Diagnosis not present

## 2019-04-12 DIAGNOSIS — M1990 Unspecified osteoarthritis, unspecified site: Secondary | ICD-10-CM | POA: Diagnosis not present

## 2019-04-12 DIAGNOSIS — S81801D Unspecified open wound, right lower leg, subsequent encounter: Secondary | ICD-10-CM | POA: Diagnosis not present

## 2019-04-12 DIAGNOSIS — S81801A Unspecified open wound, right lower leg, initial encounter: Secondary | ICD-10-CM | POA: Diagnosis not present

## 2019-04-12 DIAGNOSIS — M810 Age-related osteoporosis without current pathological fracture: Secondary | ICD-10-CM | POA: Diagnosis not present

## 2019-04-12 DIAGNOSIS — F329 Major depressive disorder, single episode, unspecified: Secondary | ICD-10-CM | POA: Diagnosis not present

## 2019-04-12 DIAGNOSIS — I10 Essential (primary) hypertension: Secondary | ICD-10-CM | POA: Diagnosis not present

## 2019-04-12 NOTE — Progress Notes (Signed)
[facility]   MULTIDISCIPLINARY HEART VALVE TEAM  Date:  04/12/2019   ID:  Michaela, Simpson [birth year], MRN [id number]  PCP:  Michaela Squibb, MD   Chief Complaint  Patient presents with   Aortic Stenosis     HISTORY OF PRESENT ILLNESS: Michaela Simpson is a 85 y.o. female who presents for evaluation of severe aortic stenosis, referred by Dr Michaela Simpson.  She was hospitalized 1 month ago after a mechanical fall resulting in a left hip fracture and left humerus fracture.  She underwent ORIF of the hip fracture and nonoperative treatment of the humerus fracture was recommended.  During her hospitalization she was noted to have a heart murmur and an echocardiogram was ordered.  This demonstrated severe aortic stenosis and she is referred for outpatient consultation to review treatment options.    She is here with her daughter today. She has 3 children - 1 daughter in [place], a daughter in [place], and a son in [place]. The patient has been widowed for about 10 years. She worked as a Statistician for 28 years.   The patient has known of a heart murmur for decades. She has been remarkably healthy over the years. She had a right hip fracture 4 years ago requiring surgery and her recent hip surgery, otherwise no surgeries in the past. She has had a right leg ulcer for about one year and she recently was found to have arterial insufficiency in the right leg. She has been referred to [facility] for further evaluation. She has not required any recent antibiotics, nor has she had fever or systemic symptoms.   The patient has had regular dental care and reports no significant problems with her teeth. She has had a few extractions over the years but none recently. She hasn't been to the dentist since the covid-19 pandemic.  The patient denies any significant symptoms prior to her mechanical fall.  However, her daughter has noticed that she clearly become short of breath with physical  activity.  She has to stop and rest when they go for a walk.  She is able to do light yard work without symptoms and really only complains of generalized fatigue.  She has not had chest pain, chest pressure, lightheadedness, or syncope.       Past Medical History:  Diagnosis Date   AKI (acute kidney injury) ([place])    Anemia    Aortic stenosis    Depression    Hip fracture ([facility])    HTN (hypertension)    Humerus fracture    Leukocytosis    Osteoporosis    Rheumatoid arthritis ([place])    Ulcer of lower extremity (H[facility])     Current Outpatient Medications  Medication Sig Dispense Refill   acetaminophen (TYLENOL) 325 MG tablet Take 2 tablets (650 mg total) by mouth every 6 (six) hours as needed for mild pain (or Fever >/= 101).     amLODipine (NORVASC) 2.5 MG tablet Take 1 tablet (2.5 mg total) by mouth at bedtime. 30 tablet 0   aspirin EC 325 MG tablet Take 1 tablet (325 mg total) by mouth daily. 30 tablet 0   Lactobacillus (PROBIOTIC ACIDOPHILUS PO) Take 1 tablet by mouth daily.     lactose free nutrition (BOOST) LIQD Take 237 mLs by mouth 4 (four) times daily.     LORazepam (ATIVAN) 0.5 MG tablet Take 0.25-0.5 mg by mouth at bedtime.     meclizine (ANTIVERT) 12.5 MG tablet Take 1 tablet (  12.5 mg total) by mouth daily as needed for dizziness. 30 tablet 0   Misc Natural Products (OSTEO BI-FLEX JOINT SHIELD PO) Take 250 mg by mouth daily.     Multiple Vitamins-Minerals (MULTIVITAMIN WITH MINERALS) tablet Take 1 tablet by mouth daily.     oxyCODONE-acetaminophen (PERCOCET/ROXICET) 5-325 MG tablet Take 1-2 tablets by mouth every 4 (four) hours as needed for severe pain.     polyethylene glycol (MIRALAX / GLYCOLAX) 17 g packet Take 17 g by mouth daily.     sertraline (ZOLOFT) 100 MG tablet Take 100 mg by mouth daily.     Silver (SILVASORB) GEL Gel,extended release; - ; amt: small amount; topical  Special Instructions: Cleanse wound to RLE with NS. Apply SilvaSorb, 4x4 and  wrap in kling and ace wrap. Change M, W, F Once A Day on Mon, Wed, Fri 07:15 AM - 03:15 PM     No current facility-administered medications for this visit.     ALLERGIES:   Bee pollen, Penicillins, and Codeine   SOCIAL HISTORY:  The patient  reports that she has never smoked. She has never used smokeless tobacco. She reports that she does not drink alcohol or use drugs.   FAMILY HISTORY:  The patient's family history includes Cancer in her sister.   REVIEW OF SYSTEMS:  Positive for a right leg wound. .   All other systems are reviewed and negative.   PHYSICAL EXAM: VS:  BP 106/62    Pulse 77    Ht 5\' 2"  (1.575 m)    Wt 105 lb (47.6 kg)    SpO2 96%    BMI 19.20 kg/m  , BMI Body mass index is 19.2 kg/m. GEN: Well nourished, well developed, in no acute distress HEENT: normal Neck: No JVD. carotids 2+ with bilateral bruits Cardiac: The heart is RRR with 3/6 harsh late peaking systolic murmur at the RUSB. No edema.  Respiratory:  clear to auscultation bilaterally GI: soft, nontender, nondistended, + BS MS: no deformity or atrophy Skin: warm and dry, no rash Neuro:  Strength and sensation are intact Psych: euthymic mood, full affect  EKG:  EKG from today reviewed and demonstrates normal sinus rhythm 77 bpm, within normal limits.  RECENT LABS: 03/12/2019: Platelets 202 03/19/2019: BUN 31; Creatinine, Ser 0.87; Hemoglobin 8.8; Potassium 4.1; Sodium 139  No results found for requested labs within last 8760 hours.   CrCl cannot be calculated (Patient's most recent lab result is older than the maximum 21 days allowed.).   Wt Readings from Last 3 Encounters:  04/12/19 105 lb (47.6 kg)  04/05/19 104 lb (47.2 kg)  04/02/19 108 lb 4.8 oz (49.1 kg)     CARDIAC STUDIES: Echo 03/09/2019: IMPRESSIONS    1. Left ventricular ejection fraction, by visual estimation, is 65 to 70%. The left ventricle has normal function. Normal left ventricular size. There is no left ventricular  hypertrophy.  2. Global right ventricle has normal systolic function.The right ventricular size is normal. No increase in right ventricular wall thickness.  3. Left atrial size was normal.  4. Right atrial size was normal.  5. The mitral valve is normal in structure. No evidence of mitral valve regurgitation. No evidence of mitral stenosis.  6. The tricuspid valve is normal in structure. Tricuspid valve regurgitation was not visualized by color flow Doppler.  7. Aortic valve mean gradient measures 54.0 mmHg.  8. Aortic valve peak gradient measures 84.6 mmHg.  9. The aortic valve is tricuspid with severely calcified leaflets.  Aortic valve regurgitation was not visualized by color flow Doppler. Severe aortic valve stenosis. 10. The pulmonic valve was normal in structure. Pulmonic valve regurgitation is not visualized by color flow Doppler. 11. The inferior vena cava is normal in size with greater than 50% respiratory variability, suggesting right atrial pressure of 3 mmHg. 12. Left ventricular diastolic Doppler parameters are consistent with impaired relaxation pattern of LV diastolic filling. 13. Elevated left ventricular end-diastolic pressure. 14. Moderate mitral annular calcification.  LEFT VENTRICLE          Normals PLAX 2D LVIDd:         4.00 cm  3.6 cm   Diastology                 Normals LVIDs:         2.80 cm  1.7 cm   LV e' lateral:   7.07 cm/s 6.42 cm/s LV PW:         0.70 cm  1.4 cm   LV E/e' lateral: 11.4      15.4 LV IVS:        0.70 cm  1.3 cm   LV e' medial:    4.68 cm/s 6.96 cm/s LVOT diam:     1.90 cm  2.0 cm   LV E/e' medial:  17.2      6.96 LV SV:         40 ml    79 ml LV SV Index:   26.96    45 ml/m2 LVOT Area:     2.84 cm 3.14 cm2    RIGHT VENTRICLE RV S prime:     13.30 cm/s TAPSE (M-mode): 1.9 cm  LEFT ATRIUM             Index       RIGHT ATRIUM          Index LA diam:        2.50 cm 1.66 cm/m  RA Area:     9.77 cm LA Vol (A2C):   31.6 ml 21.04 ml/m RA  Volume:   19.70 ml 13.12 ml/m LA Vol (A4C):   26.6 ml 17.71 ml/m LA Biplane Vol: 29.8 ml 19.85 ml/m  AORTIC VALVE                    Normals AV Area (Vmax):    0.59 cm AV Area (Vmean):   0.57 cm     3.06 cm2 AV Area (VTI):     0.61 cm AV Vmax:           460.00 cm/s AV Vmean:          348.500 cm/s 77 cm/s AV VTI:            1.110 m      3.15 cm2 AV Peak Grad:      84.6 mmHg AV Mean Grad:      54.0 mmHg    3 mmHg LVOT Vmax:         96.10 cm/s LVOT Vmean:        70.100 cm/s  75 cm/s LVOT VTI:          0.239 m      25.3 cm LVOT/AV VTI ratio: 0.22         1   AORTA                 Normals Ao Root diam: 3.20 cm 31 mm  MV E velocity: 80.50 cm/s  103 cm/s MV A velocity: 121.00 cm/s 70.3 cm/s SHUNTS MV E/A ratio:  0.67        1.5       Systemic VTI:  0.24 m                                      Systemic Diam: 1.90 cm    STS RISK CALCULATOR: Isolated AVR: Risk of Mortality: 4.823% Renal Failure: 2.988% Permanent Stroke: 2.674% Prolonged Ventilation: 10.353% DSW Infection: 0.042% Reoperation: 5.231% Morbidity or Mortality: 17.394% Short Length of Stay: 20.361% Long Length of Stay: 9.019%  ASSESSMENT AND PLAN: Severe, Stage D1 Aortic Stenosis with New York Heart Association functional class II symptoms of chronic diastolic heart failure/exertional dyspnea.  I have reviewed the natural history of aortic stenosis with the patient and their family members who are present today. We have discussed the limitations of medical therapy and the poor prognosis associated with symptomatic aortic stenosis. We have reviewed potential treatment options, including palliative medical therapy, conventional surgical aortic valve replacement, and transcatheter aortic valve replacement. We discussed treatment options in the context of the patient's specific comorbid medical conditions.    The patient's echo was personally reviewed and demonstrates findings consistent with critical aortic stenosis with peak  and mean transvalvular gradients of 84 and 55 mmHg, respectively and a peak transaortic velocity of 4.6 m/s.  She has preserved LV systolic function and no other significant valvular disease.  She understands the need to undergo further evaluation with right and left heart catheterization to evaluate intracardiac hemodynamics and the presence of coronary artery disease.  I have reviewed the risks, indications, and alternatives to cardiac catheterization, possible angioplasty, and stenting with the patient. Risks include but are not limited to bleeding, infection, vascular injury, stroke, myocardial infection, arrhythmia, kidney injury, radiation-related injury in the case of prolonged fluoroscopy use, emergency cardiac surgery, and death. The patient understands the risks of serious complication is 1-2 in [id number] with diagnostic cardiac cath and 1-2% or less with angioplasty/stenting.  After her catheterization, she will undergo a gated cardiac CTA and a CTA of the chest, abdomen, and pelvis to evaluate suitability of TAVR.  Finally, she will undergo formal surgical consultation as part of a multidisciplinary approach to her care.  Considering the patient's recent hip fracture and humerus fracture, she really would like some time to recover before moving forward with TAVR evaluation.  As long as her symptoms remain stable, I think this is appropriate.  If she develops resting chest pain or shortness of breath or progressive exertional symptoms, she will seek immediate medical attention.  Otherwise she will return in January 2021 to see Nell Range and likely move forward with TAVR evaluation and treatment.  Deatra James 04/12/2019 11:42 AM     [facility] [address] [place] [address]  [phone] (office) [phone] (fax)

## 2019-04-12 NOTE — Patient Instructions (Addendum)
Medication Instructions:  Your provider recommends that you continue on your current medications as directed. Please refer to the Current Medication list given to you today.    Follow-Up: You are scheduled with Dr. Antionette Char assistant, Nell Range PA, on Thursday, July 12, 2019 at 1:30PM. Please arrive by 1:15PM for check-in.

## 2019-04-13 DIAGNOSIS — F329 Major depressive disorder, single episode, unspecified: Secondary | ICD-10-CM | POA: Diagnosis not present

## 2019-04-13 DIAGNOSIS — S42302D Unspecified fracture of shaft of humerus, left arm, subsequent encounter for fracture with routine healing: Secondary | ICD-10-CM | POA: Diagnosis not present

## 2019-04-13 DIAGNOSIS — M810 Age-related osteoporosis without current pathological fracture: Secondary | ICD-10-CM | POA: Diagnosis not present

## 2019-04-13 DIAGNOSIS — I10 Essential (primary) hypertension: Secondary | ICD-10-CM | POA: Diagnosis not present

## 2019-04-13 DIAGNOSIS — S72002D Fracture of unspecified part of neck of left femur, subsequent encounter for closed fracture with routine healing: Secondary | ICD-10-CM | POA: Diagnosis not present

## 2019-04-13 DIAGNOSIS — W19XXXD Unspecified fall, subsequent encounter: Secondary | ICD-10-CM | POA: Diagnosis not present

## 2019-04-16 DIAGNOSIS — S42302D Unspecified fracture of shaft of humerus, left arm, subsequent encounter for fracture with routine healing: Secondary | ICD-10-CM | POA: Diagnosis not present

## 2019-04-16 DIAGNOSIS — I10 Essential (primary) hypertension: Secondary | ICD-10-CM | POA: Diagnosis not present

## 2019-04-16 DIAGNOSIS — W19XXXD Unspecified fall, subsequent encounter: Secondary | ICD-10-CM | POA: Diagnosis not present

## 2019-04-16 DIAGNOSIS — F329 Major depressive disorder, single episode, unspecified: Secondary | ICD-10-CM | POA: Diagnosis not present

## 2019-04-16 DIAGNOSIS — S72002D Fracture of unspecified part of neck of left femur, subsequent encounter for closed fracture with routine healing: Secondary | ICD-10-CM | POA: Diagnosis not present

## 2019-04-16 DIAGNOSIS — M810 Age-related osteoporosis without current pathological fracture: Secondary | ICD-10-CM | POA: Diagnosis not present

## 2019-04-17 DIAGNOSIS — I350 Nonrheumatic aortic (valve) stenosis: Secondary | ICD-10-CM | POA: Diagnosis not present

## 2019-04-17 DIAGNOSIS — F331 Major depressive disorder, recurrent, moderate: Secondary | ICD-10-CM | POA: Diagnosis not present

## 2019-04-17 DIAGNOSIS — L97911 Non-pressure chronic ulcer of unspecified part of right lower leg limited to breakdown of skin: Secondary | ICD-10-CM | POA: Diagnosis not present

## 2019-04-17 DIAGNOSIS — I10 Essential (primary) hypertension: Secondary | ICD-10-CM | POA: Diagnosis not present

## 2019-04-17 DIAGNOSIS — S42412D Displaced simple supracondylar fracture without intercondylar fracture of left humerus, subsequent encounter for fracture with routine healing: Secondary | ICD-10-CM | POA: Diagnosis not present

## 2019-04-17 DIAGNOSIS — S72002D Fracture of unspecified part of neck of left femur, subsequent encounter for closed fracture with routine healing: Secondary | ICD-10-CM | POA: Diagnosis not present

## 2019-04-17 DIAGNOSIS — D464 Refractory anemia, unspecified: Secondary | ICD-10-CM | POA: Diagnosis not present

## 2019-04-17 DIAGNOSIS — I739 Peripheral vascular disease, unspecified: Secondary | ICD-10-CM | POA: Diagnosis not present

## 2019-04-17 DIAGNOSIS — E441 Mild protein-calorie malnutrition: Secondary | ICD-10-CM | POA: Diagnosis not present

## 2019-04-18 DIAGNOSIS — S72002D Fracture of unspecified part of neck of left femur, subsequent encounter for closed fracture with routine healing: Secondary | ICD-10-CM | POA: Diagnosis not present

## 2019-04-18 DIAGNOSIS — M810 Age-related osteoporosis without current pathological fracture: Secondary | ICD-10-CM | POA: Diagnosis not present

## 2019-04-18 DIAGNOSIS — S42302D Unspecified fracture of shaft of humerus, left arm, subsequent encounter for fracture with routine healing: Secondary | ICD-10-CM | POA: Diagnosis not present

## 2019-04-18 DIAGNOSIS — F329 Major depressive disorder, single episode, unspecified: Secondary | ICD-10-CM | POA: Diagnosis not present

## 2019-04-18 DIAGNOSIS — W19XXXD Unspecified fall, subsequent encounter: Secondary | ICD-10-CM | POA: Diagnosis not present

## 2019-04-18 DIAGNOSIS — I10 Essential (primary) hypertension: Secondary | ICD-10-CM | POA: Diagnosis not present

## 2019-04-19 DIAGNOSIS — I10 Essential (primary) hypertension: Secondary | ICD-10-CM | POA: Diagnosis not present

## 2019-04-19 DIAGNOSIS — W19XXXD Unspecified fall, subsequent encounter: Secondary | ICD-10-CM | POA: Diagnosis not present

## 2019-04-19 DIAGNOSIS — M810 Age-related osteoporosis without current pathological fracture: Secondary | ICD-10-CM | POA: Diagnosis not present

## 2019-04-19 DIAGNOSIS — S72002D Fracture of unspecified part of neck of left femur, subsequent encounter for closed fracture with routine healing: Secondary | ICD-10-CM | POA: Diagnosis not present

## 2019-04-19 DIAGNOSIS — S42302D Unspecified fracture of shaft of humerus, left arm, subsequent encounter for fracture with routine healing: Secondary | ICD-10-CM | POA: Diagnosis not present

## 2019-04-19 DIAGNOSIS — F329 Major depressive disorder, single episode, unspecified: Secondary | ICD-10-CM | POA: Diagnosis not present

## 2019-04-20 DIAGNOSIS — I10 Essential (primary) hypertension: Secondary | ICD-10-CM | POA: Diagnosis not present

## 2019-04-20 DIAGNOSIS — W19XXXD Unspecified fall, subsequent encounter: Secondary | ICD-10-CM | POA: Diagnosis not present

## 2019-04-20 DIAGNOSIS — S72002D Fracture of unspecified part of neck of left femur, subsequent encounter for closed fracture with routine healing: Secondary | ICD-10-CM | POA: Diagnosis not present

## 2019-04-20 DIAGNOSIS — F329 Major depressive disorder, single episode, unspecified: Secondary | ICD-10-CM | POA: Diagnosis not present

## 2019-04-20 DIAGNOSIS — M810 Age-related osteoporosis without current pathological fracture: Secondary | ICD-10-CM | POA: Diagnosis not present

## 2019-04-20 DIAGNOSIS — S42302D Unspecified fracture of shaft of humerus, left arm, subsequent encounter for fracture with routine healing: Secondary | ICD-10-CM | POA: Diagnosis not present

## 2019-04-23 DIAGNOSIS — S42302D Unspecified fracture of shaft of humerus, left arm, subsequent encounter for fracture with routine healing: Secondary | ICD-10-CM | POA: Diagnosis not present

## 2019-04-23 DIAGNOSIS — M810 Age-related osteoporosis without current pathological fracture: Secondary | ICD-10-CM | POA: Diagnosis not present

## 2019-04-23 DIAGNOSIS — W19XXXD Unspecified fall, subsequent encounter: Secondary | ICD-10-CM | POA: Diagnosis not present

## 2019-04-23 DIAGNOSIS — I10 Essential (primary) hypertension: Secondary | ICD-10-CM | POA: Diagnosis not present

## 2019-04-23 DIAGNOSIS — F329 Major depressive disorder, single episode, unspecified: Secondary | ICD-10-CM | POA: Diagnosis not present

## 2019-04-23 DIAGNOSIS — S72002D Fracture of unspecified part of neck of left femur, subsequent encounter for closed fracture with routine healing: Secondary | ICD-10-CM | POA: Diagnosis not present

## 2019-04-24 DIAGNOSIS — F329 Major depressive disorder, single episode, unspecified: Secondary | ICD-10-CM | POA: Diagnosis not present

## 2019-04-24 DIAGNOSIS — I10 Essential (primary) hypertension: Secondary | ICD-10-CM | POA: Diagnosis not present

## 2019-04-24 DIAGNOSIS — S72002D Fracture of unspecified part of neck of left femur, subsequent encounter for closed fracture with routine healing: Secondary | ICD-10-CM | POA: Diagnosis not present

## 2019-04-24 DIAGNOSIS — W19XXXD Unspecified fall, subsequent encounter: Secondary | ICD-10-CM | POA: Diagnosis not present

## 2019-04-24 DIAGNOSIS — M810 Age-related osteoporosis without current pathological fracture: Secondary | ICD-10-CM | POA: Diagnosis not present

## 2019-04-24 DIAGNOSIS — S42302D Unspecified fracture of shaft of humerus, left arm, subsequent encounter for fracture with routine healing: Secondary | ICD-10-CM | POA: Diagnosis not present

## 2019-04-25 ENCOUNTER — Encounter: Payer: Self-pay | Admitting: Orthopaedic Surgery

## 2019-04-25 ENCOUNTER — Ambulatory Visit (INDEPENDENT_AMBULATORY_CARE_PROVIDER_SITE_OTHER): Payer: Medicare Other

## 2019-04-25 ENCOUNTER — Ambulatory Visit (INDEPENDENT_AMBULATORY_CARE_PROVIDER_SITE_OTHER): Payer: Medicare Other | Admitting: Orthopaedic Surgery

## 2019-04-25 DIAGNOSIS — M25512 Pain in left shoulder: Secondary | ICD-10-CM

## 2019-04-25 DIAGNOSIS — S72002D Fracture of unspecified part of neck of left femur, subsequent encounter for closed fracture with routine healing: Secondary | ICD-10-CM

## 2019-04-25 DIAGNOSIS — S42292D Other displaced fracture of upper end of left humerus, subsequent encounter for fracture with routine healing: Secondary | ICD-10-CM

## 2019-04-25 NOTE — Progress Notes (Signed)
The patient is a 85 year old female who is 7 weeks out from cannulated screw fixation of a left hip femoral neck fracture.  She also had a significant proximal humerus fracture and has been in a sling.  She is ready to live independently more at home she states.  Her daughter is with her as well today.  I took her sling off today and she tolerates me putting her left shoulder through internal and external rotation slight abduction.  At this point she can be out of her sling.  X-rays of the left shoulder show that it is well located.  There is been some interval healing of the impacted humeral head on the humeral shaft with varus malalignment.  Examination of her left hip does show some pain with internal and external rotation.  Her leg lengths are equal.  X-rays of the pelvis and left hip show the screws are intact and there is been interval healing.  There has been no penetration of the screws at the femoral head.  Certainly she still may end up developing osteonecrosis of the femoral head and she understands this.  If that becomes the case we would remove the hardware and perform an anterior placement.  For now though, she can continue increase her activities as comfort allows.  She can be out of her sling.  I would like to see her back in about 6 weeks with repeat internal and external rotation views as well as an axillary view of her left shoulder.  I would also like an AP and lateral of the left hip only.  We do not need to see the pelvis.  All question concerns were answered and addressed.

## 2019-04-26 DIAGNOSIS — M810 Age-related osteoporosis without current pathological fracture: Secondary | ICD-10-CM | POA: Diagnosis not present

## 2019-04-26 DIAGNOSIS — L97909 Non-pressure chronic ulcer of unspecified part of unspecified lower leg with unspecified severity: Secondary | ICD-10-CM | POA: Diagnosis not present

## 2019-04-26 DIAGNOSIS — I10 Essential (primary) hypertension: Secondary | ICD-10-CM | POA: Diagnosis not present

## 2019-04-26 DIAGNOSIS — W19XXXD Unspecified fall, subsequent encounter: Secondary | ICD-10-CM | POA: Diagnosis not present

## 2019-04-26 DIAGNOSIS — M1990 Unspecified osteoarthritis, unspecified site: Secondary | ICD-10-CM | POA: Diagnosis not present

## 2019-04-26 DIAGNOSIS — F329 Major depressive disorder, single episode, unspecified: Secondary | ICD-10-CM | POA: Diagnosis not present

## 2019-04-26 DIAGNOSIS — S72002D Fracture of unspecified part of neck of left femur, subsequent encounter for closed fracture with routine healing: Secondary | ICD-10-CM | POA: Diagnosis not present

## 2019-04-26 DIAGNOSIS — S81801D Unspecified open wound, right lower leg, subsequent encounter: Secondary | ICD-10-CM | POA: Diagnosis not present

## 2019-04-26 DIAGNOSIS — S42302D Unspecified fracture of shaft of humerus, left arm, subsequent encounter for fracture with routine healing: Secondary | ICD-10-CM | POA: Diagnosis not present

## 2019-04-26 DIAGNOSIS — Z79899 Other long term (current) drug therapy: Secondary | ICD-10-CM | POA: Diagnosis not present

## 2019-04-27 DIAGNOSIS — W19XXXD Unspecified fall, subsequent encounter: Secondary | ICD-10-CM | POA: Diagnosis not present

## 2019-04-27 DIAGNOSIS — M810 Age-related osteoporosis without current pathological fracture: Secondary | ICD-10-CM | POA: Diagnosis not present

## 2019-04-27 DIAGNOSIS — S42302D Unspecified fracture of shaft of humerus, left arm, subsequent encounter for fracture with routine healing: Secondary | ICD-10-CM | POA: Diagnosis not present

## 2019-04-27 DIAGNOSIS — I10 Essential (primary) hypertension: Secondary | ICD-10-CM | POA: Diagnosis not present

## 2019-04-27 DIAGNOSIS — F329 Major depressive disorder, single episode, unspecified: Secondary | ICD-10-CM | POA: Diagnosis not present

## 2019-04-27 DIAGNOSIS — S72002D Fracture of unspecified part of neck of left femur, subsequent encounter for closed fracture with routine healing: Secondary | ICD-10-CM | POA: Diagnosis not present

## 2019-04-30 ENCOUNTER — Other Ambulatory Visit: Payer: Self-pay | Admitting: *Deleted

## 2019-04-30 ENCOUNTER — Encounter: Payer: Self-pay | Admitting: Surgery

## 2019-04-30 ENCOUNTER — Other Ambulatory Visit: Payer: Self-pay

## 2019-04-30 ENCOUNTER — Ambulatory Visit (INDEPENDENT_AMBULATORY_CARE_PROVIDER_SITE_OTHER): Payer: Medicare Other | Admitting: Surgery

## 2019-04-30 ENCOUNTER — Encounter: Payer: Self-pay | Admitting: *Deleted

## 2019-04-30 VITALS — BP 136/82 | HR 87 | Temp 97.9°F | Resp 18 | Ht 62.0 in | Wt 104.0 lb

## 2019-04-30 DIAGNOSIS — S42302D Unspecified fracture of shaft of humerus, left arm, subsequent encounter for fracture with routine healing: Secondary | ICD-10-CM | POA: Diagnosis not present

## 2019-04-30 DIAGNOSIS — I70233 Atherosclerosis of native arteries of right leg with ulceration of ankle: Secondary | ICD-10-CM | POA: Diagnosis not present

## 2019-04-30 DIAGNOSIS — W19XXXD Unspecified fall, subsequent encounter: Secondary | ICD-10-CM | POA: Diagnosis not present

## 2019-04-30 DIAGNOSIS — F329 Major depressive disorder, single episode, unspecified: Secondary | ICD-10-CM | POA: Diagnosis not present

## 2019-04-30 DIAGNOSIS — S72002D Fracture of unspecified part of neck of left femur, subsequent encounter for closed fracture with routine healing: Secondary | ICD-10-CM | POA: Diagnosis not present

## 2019-04-30 DIAGNOSIS — I10 Essential (primary) hypertension: Secondary | ICD-10-CM | POA: Diagnosis not present

## 2019-04-30 DIAGNOSIS — M810 Age-related osteoporosis without current pathological fracture: Secondary | ICD-10-CM | POA: Diagnosis not present

## 2019-04-30 NOTE — H&P (View-Only) (Signed)
[facility]  Patient name: Michaela Simpson MRN: [id number] DOB: 12/14/1931 Sex: female   REQUESTING PROVIDER:    Dr. Dionicia Abler   REASON FOR CONSULT:    Abnormal venous dopplers  HISTORY OF PRESENT ILLNESS:   Michaela Simpson is a 85 y.o. female, who is valuation of her blood flow to the right leg.  The patient has had a ulcer on her right shin for about 1 year.  She recently started going ot the wound center.  At her first visit she slipped and fell and broke her left hip.  The patient had a cardiac echo prior to her orthopedic procedure and was found to have critical aortic stenosis.  She is scheduled for TAVR in January she is medically managed for hypertension.  She is a non-smoker.  PAST MEDICAL HISTORY    Past Medical History:  Diagnosis Date  . AKI (acute kidney injury) ([place])   . Anemia   . Aortic stenosis   . Depression   . Hip fracture ([place])   . HTN (hypertension)   . Humerus fracture   . Leukocytosis   . Osteoporosis   . Rheumatoid arthritis ([place])   . Ulcer of lower extremity ([place])      FAMILY HISTORY   Family History  Problem Relation Age of Onset  . Cancer Sister     SOCIAL HISTORY:   Social History   Socioeconomic History  . Marital status: Widowed    Spouse name: Not on file  . Number of children: Not on file  . Years of education: Not on file  . Highest education level: Not on file  Occupational History  . Not on file  Social Needs  . Financial resource strain: Not hard at all  . Food insecurity    Worry: Never true    Inability: Never true  . Transportation needs    Medical: No    Non-medical: No  Tobacco Use  . Smoking status: Never Smoker  . Smokeless tobacco: Never Used  Substance and Sexual Activity  . Alcohol use: No  . Drug use: No  . Sexual activity: Not on file  Lifestyle  . Physical activity    Days per week: Patient refused    Minutes per  session: Patient refused  . Stress: Not at all  Relationships  . Social Herbalist on phone: Patient refused    Gets together: Patient refused    Attends religious service: Patient refused    Active member of club or organization: Patient refused    Attends meetings of clubs or organizations: Patient refused    Relationship status: Patient refused  . Intimate partner violence    Fear of current or ex partner: Patient refused    Emotionally abused: Patient refused    Physically abused: Patient refused    Forced sexual activity: Patient refused  Other Topics Concern  . Not on file  Social History Narrative  . Not on file    ALLERGIES:    Allergies  Allergen Reactions  . Bee Pollen   . Penicillins Other (See Comments)    Child hood allergy  . Codeine Nausea And Vomiting and Rash    CURRENT MEDICATIONS:    Current Outpatient Medications  Medication Sig Dispense Refill  . acetaminophen (TYLENOL) 325 MG tablet Take 2 tablets (650 mg total) by mouth every 6 (six) hours as needed for mild pain (or Fever >/= 101).    Marland Kitchen amLODipine (  NORVASC) 2.5 MG tablet Take 1 tablet (2.5 mg total) by mouth at bedtime. 30 tablet 0  . aspirin EC 325 MG tablet Take 1 tablet (325 mg total) by mouth daily. 30 tablet 0  . Lactobacillus (PROBIOTIC ACIDOPHILUS PO) Take 1 tablet by mouth daily.    Marland Kitchen lactose free nutrition (BOOST) LIQD Take 237 mLs by mouth 4 (four) times daily.    Marland Kitchen LORazepam (ATIVAN) 0.5 MG tablet Take 0.25-0.5 mg by mouth at bedtime.    . meclizine (ANTIVERT) 12.5 MG tablet Take 1 tablet (12.5 mg total) by mouth daily as needed for dizziness. 30 tablet 0  . Misc Natural Products (OSTEO BI-FLEX JOINT SHIELD PO) Take 250 mg by mouth daily.    . Multiple Vitamins-Minerals (MULTIVITAMIN WITH MINERALS) tablet Take 1 tablet by mouth daily.    Marland Kitchen oxyCODONE-acetaminophen (PERCOCET/ROXICET) 5-325 MG tablet Take 1-2 tablets by mouth every 4 (four) hours as needed for severe pain.    .  polyethylene glycol (MIRALAX / GLYCOLAX) 17 g packet Take 17 g by mouth daily.    . sertraline (ZOLOFT) 100 MG tablet Take 100 mg by mouth daily.    Cathie Olden (SILVASORB) GEL Gel,extended release; - ; amt: small amount; topical  Special Instructions: Cleanse wound to RLE with NS. Apply SilvaSorb, 4x4 and wrap in kling and ace wrap. Change M, W, F Once A Day on Mon, Wed, Fri 07:15 AM - 03:15 PM     No current facility-administered medications for this visit.     REVIEW OF SYSTEMS:   [X]  denotes positive finding, [ ]  denotes negative finding Cardiac  Comments:  Chest pain or chest pressure:    Shortness of breath upon exertion: x   Short of breath when lying flat:    Irregular heart rhythm:        Vascular    Pain in calf, thigh, or hip brought on by ambulation:    Pain in feet at night that wakes you up from your sleep:     Blood clot in your veins:    Leg swelling:         Pulmonary    Oxygen at home:    Productive cough:     Wheezing:         Neurologic    Sudden weakness in arms or legs:     Sudden numbness in arms or legs:     Sudden onset of difficulty speaking or slurred speech:    Temporary loss of vision in one eye:     Problems with dizziness:         Gastrointestinal    Blood in stool:      Vomited blood:         Genitourinary    Burning when urinating:     Blood in urine:        Psychiatric    Major depression:         Hematologic    Bleeding problems:    Problems with blood clotting too easily:        Skin    Rashes or ulcers: x       Constitutional    Fever or chills:     PHYSICAL EXAM:   Vitals:   04/30/19 1053  BP: 136/82  Pulse: 87  Resp: 18  Temp: 97.9 F (36.6 C)  SpO2: 97%  Weight: 104 lb (47.2 kg)  Height: 5\' 2"  (1.575 m)    GENERAL: The patient is a well-nourished female,  in no acute distress. The vital signs are documented above. CARDIAC: There is a regular rate and rhythm.  VASCULAR: Palpable femoral pulses.  Nonpalpable  right pedal pulse PULMONARY: Nonlabored respirations ABDOMEN: Soft and non-tender with normal pitched bowel sounds.  MUSCULOSKELETAL: There are no major deformities or cyanosis. NEUROLOGIC: No focal weakness or paresthesias are detected. SKIN: See photo below PSYCHIATRIC: The patient has a normal affect.    STUDIES:   I have reviewed her vascular lab studies: Right ABI:  0.6 Left ABI: 1.0  Venous dopplers negative for DVT ASSESSMENT and PLAN   Right leg ulcer: I suspect the reason that she has not been able to heal her wound is arterial insufficiency.  I have recommended proceeding with angiography to define her anatomy and intervene if necessary.  This is been scheduled for Tuesday, November 17.  I discussed the details of the procedure as well as the risks and benefits including the risk of bleeding and distal embolization as well as possible surgical intervention.  All questions were answered today.  She understands this is a limb threatening situation.  If intervention is performed, she will need to be started on statin therapy   Annamarie Major, IV, MD, FACS [facility] [phone] Pager [phone]

## 2019-04-30 NOTE — Progress Notes (Signed)
[facility] [place]  Patient name: Michaela Simpson MRN: [id number] DOB: Dec 16, 1931 Sex: female   REQUESTING PROVIDER:    Dr. Dionicia Abler   REASON FOR CONSULT:    Abnormal venous dopplers  HISTORY OF PRESENT ILLNESS:   Michaela Simpson is a 85 y.o. female, who is valuation of her blood flow to the right leg.  The patient has had a ulcer on her right shin for about 1 year.  She recently started going ot the wound center.  At her first visit she slipped and fell and broke her left hip.  The patient had a cardiac echo prior to her orthopedic procedure and was found to have critical aortic stenosis.  She is scheduled for TAVR in January she is medically managed for hypertension.  She is a non-smoker.  PAST MEDICAL HISTORY    Past Medical History:  Diagnosis Date  . AKI (acute kidney injury) ([place])   . Anemia   . Aortic stenosis   . Depression   . Hip fracture ([place])   . HTN (hypertension)   . Humerus fracture   . Leukocytosis   . Osteoporosis   . Rheumatoid arthritis ([place])   . Ulcer of lower extremity ([place])      FAMILY HISTORY   Family History  Problem Relation Age of Onset  . Cancer Sister     SOCIAL HISTORY:   Social History   Socioeconomic History  . Marital status: Widowed    Spouse name: Not on file  . Number of children: Not on file  . Years of education: Not on file  . Highest education level: Not on file  Occupational History  . Not on file  Social Needs  . Financial resource strain: Not hard at all  . Food insecurity    Worry: Never true    Inability: Never true  . Transportation needs    Medical: No    Non-medical: No  Tobacco Use  . Smoking status: Never Smoker  . Smokeless tobacco: Never Used  Substance and Sexual Activity  . Alcohol use: No  . Drug use: No  . Sexual activity: Not on file  Lifestyle  . Physical activity    Days per week: Patient refused    Minutes per  session: Patient refused  . Stress: Not at all  Relationships  . Social Herbalist on phone: Patient refused    Gets together: Patient refused    Attends religious service: Patient refused    Active member of club or organization: Patient refused    Attends meetings of clubs or organizations: Patient refused    Relationship status: Patient refused  . Intimate partner violence    Fear of current or ex partner: Patient refused    Emotionally abused: Patient refused    Physically abused: Patient refused    Forced sexual activity: Patient refused  Other Topics Concern  . Not on file  Social History Narrative  . Not on file    ALLERGIES:    Allergies  Allergen Reactions  . Bee Pollen   . Penicillins Other (See Comments)    Child hood allergy  . Codeine Nausea And Vomiting and Rash    CURRENT MEDICATIONS:    Current Outpatient Medications  Medication Sig Dispense Refill  . acetaminophen (TYLENOL) 325 MG tablet Take 2 tablets (650 mg total) by mouth every 6 (six) hours as needed for mild pain (or Fever >/= 101).    Marland Kitchen amLODipine (  NORVASC) 2.5 MG tablet Take 1 tablet (2.5 mg total) by mouth at bedtime. 30 tablet 0  . aspirin EC 325 MG tablet Take 1 tablet (325 mg total) by mouth daily. 30 tablet 0  . Lactobacillus (PROBIOTIC ACIDOPHILUS PO) Take 1 tablet by mouth daily.    Marland Kitchen lactose free nutrition (BOOST) LIQD Take 237 mLs by mouth 4 (four) times daily.    Marland Kitchen LORazepam (ATIVAN) 0.5 MG tablet Take 0.25-0.5 mg by mouth at bedtime.    . meclizine (ANTIVERT) 12.5 MG tablet Take 1 tablet (12.5 mg total) by mouth daily as needed for dizziness. 30 tablet 0  . Misc Natural Products (OSTEO BI-FLEX JOINT SHIELD PO) Take 250 mg by mouth daily.    . Multiple Vitamins-Minerals (MULTIVITAMIN WITH MINERALS) tablet Take 1 tablet by mouth daily.    Marland Kitchen oxyCODONE-acetaminophen (PERCOCET/ROXICET) 5-325 MG tablet Take 1-2 tablets by mouth every 4 (four) hours as needed for severe pain.    .  polyethylene glycol (MIRALAX / GLYCOLAX) 17 g packet Take 17 g by mouth daily.    . sertraline (ZOLOFT) 100 MG tablet Take 100 mg by mouth daily.    Cathie Olden (SILVASORB) GEL Gel,extended release; - ; amt: small amount; topical  Special Instructions: Cleanse wound to RLE with NS. Apply SilvaSorb, 4x4 and wrap in kling and ace wrap. Change M, W, F Once A Day on Mon, Wed, Fri 07:15 AM - 03:15 PM     No current facility-administered medications for this visit.     REVIEW OF SYSTEMS:   [X]  denotes positive finding, [ ]  denotes negative finding Cardiac  Comments:  Chest pain or chest pressure:    Shortness of breath upon exertion: x   Short of breath when lying flat:    Irregular heart rhythm:        Vascular    Pain in calf, thigh, or hip brought on by ambulation:    Pain in feet at night that wakes you up from your sleep:     Blood clot in your veins:    Leg swelling:         Pulmonary    Oxygen at home:    Productive cough:     Wheezing:         Neurologic    Sudden weakness in arms or legs:     Sudden numbness in arms or legs:     Sudden onset of difficulty speaking or slurred speech:    Temporary loss of vision in one eye:     Problems with dizziness:         Gastrointestinal    Blood in stool:      Vomited blood:         Genitourinary    Burning when urinating:     Blood in urine:        Psychiatric    Major depression:         Hematologic    Bleeding problems:    Problems with blood clotting too easily:        Skin    Rashes or ulcers: x       Constitutional    Fever or chills:     PHYSICAL EXAM:   Vitals:   04/30/19 1053  BP: 136/82  Pulse: 87  Resp: 18  Temp: 97.9 F (36.6 C)  SpO2: 97%  Weight: 104 lb (47.2 kg)  Height: 5\' 2"  (1.575 m)    GENERAL: The patient is a well-nourished female,  in no acute distress. The vital signs are documented above. CARDIAC: There is a regular rate and rhythm.  VASCULAR: Palpable femoral pulses.  Nonpalpable  right pedal pulse PULMONARY: Nonlabored respirations ABDOMEN: Soft and non-tender with normal pitched bowel sounds.  MUSCULOSKELETAL: There are no major deformities or cyanosis. NEUROLOGIC: No focal weakness or paresthesias are detected. SKIN: See photo below PSYCHIATRIC: The patient has a normal affect.    STUDIES:   I have reviewed her vascular lab studies: Right ABI:  0.6 Left ABI: 1.0  Venous dopplers negative for DVT ASSESSMENT and PLAN   Right leg ulcer: I suspect the reason that she has not been able to heal her wound is arterial insufficiency.  I have recommended proceeding with angiography to define her anatomy and intervene if necessary.  This is been scheduled for Tuesday, November 17.  I discussed the details of the procedure as well as the risks and benefits including the risk of bleeding and distal embolization as well as possible surgical intervention.  All questions were answered today.  She understands this is a limb threatening situation.  If intervention is performed, she will need to be started on statin therapy   Annamarie Major, IV, MD, FACS [facility] [phone] Pager [phone]

## 2019-05-01 DIAGNOSIS — F329 Major depressive disorder, single episode, unspecified: Secondary | ICD-10-CM | POA: Diagnosis not present

## 2019-05-01 DIAGNOSIS — M810 Age-related osteoporosis without current pathological fracture: Secondary | ICD-10-CM | POA: Diagnosis not present

## 2019-05-01 DIAGNOSIS — W19XXXD Unspecified fall, subsequent encounter: Secondary | ICD-10-CM | POA: Diagnosis not present

## 2019-05-01 DIAGNOSIS — I10 Essential (primary) hypertension: Secondary | ICD-10-CM | POA: Diagnosis not present

## 2019-05-01 DIAGNOSIS — S42302D Unspecified fracture of shaft of humerus, left arm, subsequent encounter for fracture with routine healing: Secondary | ICD-10-CM | POA: Diagnosis not present

## 2019-05-01 DIAGNOSIS — S72002D Fracture of unspecified part of neck of left femur, subsequent encounter for closed fracture with routine healing: Secondary | ICD-10-CM | POA: Diagnosis not present

## 2019-05-02 DIAGNOSIS — I10 Essential (primary) hypertension: Secondary | ICD-10-CM | POA: Diagnosis not present

## 2019-05-02 DIAGNOSIS — W19XXXD Unspecified fall, subsequent encounter: Secondary | ICD-10-CM | POA: Diagnosis not present

## 2019-05-02 DIAGNOSIS — F329 Major depressive disorder, single episode, unspecified: Secondary | ICD-10-CM | POA: Diagnosis not present

## 2019-05-02 DIAGNOSIS — M810 Age-related osteoporosis without current pathological fracture: Secondary | ICD-10-CM | POA: Diagnosis not present

## 2019-05-02 DIAGNOSIS — S42302D Unspecified fracture of shaft of humerus, left arm, subsequent encounter for fracture with routine healing: Secondary | ICD-10-CM | POA: Diagnosis not present

## 2019-05-02 DIAGNOSIS — S72002D Fracture of unspecified part of neck of left femur, subsequent encounter for closed fracture with routine healing: Secondary | ICD-10-CM | POA: Diagnosis not present

## 2019-05-03 DIAGNOSIS — S42302D Unspecified fracture of shaft of humerus, left arm, subsequent encounter for fracture with routine healing: Secondary | ICD-10-CM | POA: Diagnosis not present

## 2019-05-03 DIAGNOSIS — W19XXXD Unspecified fall, subsequent encounter: Secondary | ICD-10-CM | POA: Diagnosis not present

## 2019-05-03 DIAGNOSIS — S72002D Fracture of unspecified part of neck of left femur, subsequent encounter for closed fracture with routine healing: Secondary | ICD-10-CM | POA: Diagnosis not present

## 2019-05-03 DIAGNOSIS — M810 Age-related osteoporosis without current pathological fracture: Secondary | ICD-10-CM | POA: Diagnosis not present

## 2019-05-03 DIAGNOSIS — I10 Essential (primary) hypertension: Secondary | ICD-10-CM | POA: Diagnosis not present

## 2019-05-03 DIAGNOSIS — F329 Major depressive disorder, single episode, unspecified: Secondary | ICD-10-CM | POA: Diagnosis not present

## 2019-05-04 ENCOUNTER — Other Ambulatory Visit: Payer: Self-pay | Admitting: Adult Health

## 2019-05-04 ENCOUNTER — Other Ambulatory Visit (HOSPITAL_COMMUNITY)
Admission: RE | Admit: 2019-05-04 | Discharge: 2019-05-04 | Disposition: A | Discharge status: Home / Self Care | Payer: Medicare Other | Source: Ambulatory Visit | Attending: Surgery | Admitting: Surgery

## 2019-05-04 ENCOUNTER — Other Ambulatory Visit: Payer: Self-pay

## 2019-05-04 DIAGNOSIS — I10 Essential (primary) hypertension: Secondary | ICD-10-CM | POA: Diagnosis not present

## 2019-05-04 DIAGNOSIS — Z01812 Encounter for preprocedural laboratory examination: Secondary | ICD-10-CM | POA: Insufficient documentation

## 2019-05-04 DIAGNOSIS — Z20828 Contact with and (suspected) exposure to other viral communicable diseases: Secondary | ICD-10-CM | POA: Diagnosis not present

## 2019-05-04 DIAGNOSIS — M810 Age-related osteoporosis without current pathological fracture: Secondary | ICD-10-CM | POA: Diagnosis not present

## 2019-05-04 DIAGNOSIS — W19XXXD Unspecified fall, subsequent encounter: Secondary | ICD-10-CM | POA: Diagnosis not present

## 2019-05-04 DIAGNOSIS — S72002D Fracture of unspecified part of neck of left femur, subsequent encounter for closed fracture with routine healing: Secondary | ICD-10-CM | POA: Diagnosis not present

## 2019-05-04 DIAGNOSIS — F329 Major depressive disorder, single episode, unspecified: Secondary | ICD-10-CM | POA: Diagnosis not present

## 2019-05-04 DIAGNOSIS — S42302D Unspecified fracture of shaft of humerus, left arm, subsequent encounter for fracture with routine healing: Secondary | ICD-10-CM | POA: Diagnosis not present

## 2019-05-04 LAB — SARS CORONAVIRUS 2 (TAT 6-24 HRS): SARS Coronavirus 2: NEGATIVE

## 2019-05-07 DIAGNOSIS — M810 Age-related osteoporosis without current pathological fracture: Secondary | ICD-10-CM | POA: Diagnosis not present

## 2019-05-07 DIAGNOSIS — S42302D Unspecified fracture of shaft of humerus, left arm, subsequent encounter for fracture with routine healing: Secondary | ICD-10-CM | POA: Diagnosis not present

## 2019-05-07 DIAGNOSIS — F329 Major depressive disorder, single episode, unspecified: Secondary | ICD-10-CM | POA: Diagnosis not present

## 2019-05-07 DIAGNOSIS — I10 Essential (primary) hypertension: Secondary | ICD-10-CM | POA: Diagnosis not present

## 2019-05-07 DIAGNOSIS — W19XXXD Unspecified fall, subsequent encounter: Secondary | ICD-10-CM | POA: Diagnosis not present

## 2019-05-07 DIAGNOSIS — S72002D Fracture of unspecified part of neck of left femur, subsequent encounter for closed fracture with routine healing: Secondary | ICD-10-CM | POA: Diagnosis not present

## 2019-05-08 ENCOUNTER — Ambulatory Visit (HOSPITAL_COMMUNITY)
Admission: RE | Disposition: A | Discharge status: Home / Self Care | Payer: Self-pay | Source: Ambulatory Visit | Attending: Surgery

## 2019-05-08 ENCOUNTER — Ambulatory Visit (HOSPITAL_COMMUNITY)
Admission: RE | Admit: 2019-05-08 | Discharge: 2019-05-08 | Disposition: A | Discharge status: Home / Self Care | Payer: Medicare Other | Source: Ambulatory Visit | Attending: Surgery | Admitting: Surgery

## 2019-05-08 ENCOUNTER — Other Ambulatory Visit: Payer: Self-pay

## 2019-05-08 DIAGNOSIS — I739 Peripheral vascular disease, unspecified: Secondary | ICD-10-CM | POA: Insufficient documentation

## 2019-05-08 DIAGNOSIS — M069 Rheumatoid arthritis, unspecified: Secondary | ICD-10-CM | POA: Diagnosis not present

## 2019-05-08 DIAGNOSIS — I10 Essential (primary) hypertension: Secondary | ICD-10-CM | POA: Insufficient documentation

## 2019-05-08 DIAGNOSIS — I70232 Atherosclerosis of native arteries of right leg with ulceration of calf: Secondary | ICD-10-CM | POA: Diagnosis not present

## 2019-05-08 DIAGNOSIS — F329 Major depressive disorder, single episode, unspecified: Secondary | ICD-10-CM | POA: Diagnosis not present

## 2019-05-08 DIAGNOSIS — L97819 Non-pressure chronic ulcer of other part of right lower leg with unspecified severity: Secondary | ICD-10-CM | POA: Diagnosis not present

## 2019-05-08 DIAGNOSIS — Z79899 Other long term (current) drug therapy: Secondary | ICD-10-CM | POA: Insufficient documentation

## 2019-05-08 DIAGNOSIS — I350 Nonrheumatic aortic (valve) stenosis: Secondary | ICD-10-CM | POA: Diagnosis not present

## 2019-05-08 DIAGNOSIS — Z7982 Long term (current) use of aspirin: Secondary | ICD-10-CM | POA: Insufficient documentation

## 2019-05-08 HISTORY — PX: ABDOMINAL AORTOGRAM W/LOWER EXTREMITY: CATH118223

## 2019-05-08 HISTORY — PX: PERIPHERAL VASCULAR INTERVENTION: CATH118257

## 2019-05-08 LAB — POCT I-STAT, CHEM 8
BUN: 36 mg/dL — ABNORMAL HIGH (ref 8–23)
Calcium, Ion: 1.27 mmol/L (ref 1.15–1.40)
Chloride: 106 mmol/L (ref 98–111)
Creatinine, Ser: 0.8 mg/dL (ref 0.44–1.00)
Glucose, Bld: 96 mg/dL (ref 70–99)
HCT: 45 % (ref 36.0–46.0)
Hemoglobin: 15.3 g/dL — ABNORMAL HIGH (ref 12.0–15.0)
Potassium: 4.2 mmol/L (ref 3.5–5.1)
Sodium: 140 mmol/L (ref 135–145)
TCO2: 26 mmol/L (ref 22–32)

## 2019-05-08 LAB — POCT ACTIVATED CLOTTING TIME: Activated Clotting Time: 241 seconds

## 2019-05-08 SURGERY — ABDOMINAL AORTOGRAM W/LOWER EXTREMITY
Anesthesia: LOCAL | Laterality: Right

## 2019-05-08 MED ORDER — SODIUM CHLORIDE 0.9 % IV SOLN: 250.0000 mL | INTRAVENOUS | Status: DC | PRN

## 2019-05-08 MED ORDER — IODIXANOL 320 MG/ML IV SOLN
INTRAVENOUS | Status: DC | PRN
  Administered 2019-05-08: 155 mL via INTRA_ARTERIAL

## 2019-05-08 MED ORDER — CLOPIDOGREL BISULFATE 300 MG PO TABS
ORAL_TABLET | ORAL | Status: DC | PRN
  Administered 2019-05-08: 300 mg via ORAL

## 2019-05-08 MED ORDER — ONDANSETRON HCL 4 MG/2ML IJ SOLN
4.0000 mg | Freq: Once | INTRAMUSCULAR | Status: AC
  Administered 2019-05-08: 4 mg via INTRAVENOUS

## 2019-05-08 MED ORDER — ASPIRIN EC 81 MG PO TBEC: 81.0000 mg | DELAYED_RELEASE_TABLET | Freq: Every day | ORAL | Status: DC

## 2019-05-08 MED ORDER — SODIUM CHLORIDE 0.9 % WEIGHT BASED INFUSION: 1.0000 mL/kg/h | INTRAVENOUS | Status: DC

## 2019-05-08 MED ORDER — SODIUM CHLORIDE 0.9% FLUSH: 3.0000 mL | Freq: Two times a day (BID) | INTRAVENOUS | Status: DC

## 2019-05-08 MED ORDER — ROSUVASTATIN CALCIUM 10 MG PO TABS: 10.0000 mg | ORAL_TABLET | Freq: Every day | ORAL | Status: DC

## 2019-05-08 MED ORDER — ONDANSETRON HCL 4 MG/2ML IJ SOLN
INTRAMUSCULAR | Status: AC
  Filled 2019-05-08: qty 2

## 2019-05-08 MED ORDER — HYDRALAZINE HCL 20 MG/ML IJ SOLN: 5.0000 mg | INTRAMUSCULAR | Status: DC | PRN

## 2019-05-08 MED ORDER — HEPARIN SODIUM (PORCINE) 1000 UNIT/ML IJ SOLN
INTRAMUSCULAR | Status: AC
  Filled 2019-05-08: qty 1

## 2019-05-08 MED ORDER — CLOPIDOGREL BISULFATE 300 MG PO TABS
ORAL_TABLET | ORAL | Status: AC
  Filled 2019-05-08: qty 1

## 2019-05-08 MED ORDER — HEPARIN SODIUM (PORCINE) 1000 UNIT/ML IJ SOLN
INTRAMUSCULAR | Status: DC | PRN
  Administered 2019-05-08: 5000 [IU] via INTRAVENOUS

## 2019-05-08 MED ORDER — HYDRALAZINE HCL 20 MG/ML IJ SOLN
INTRAMUSCULAR | Status: AC
  Filled 2019-05-08: qty 1

## 2019-05-08 MED ORDER — FENTANYL CITRATE (PF) 100 MCG/2ML IJ SOLN
INTRAMUSCULAR | Status: AC
  Filled 2019-05-08: qty 2

## 2019-05-08 MED ORDER — CLOPIDOGREL BISULFATE 75 MG PO TABS: 75.0000 mg | ORAL_TABLET | Freq: Every day | ORAL | 11 refills | Status: DC

## 2019-05-08 MED ORDER — MORPHINE SULFATE (PF) 2 MG/ML IV SOLN: 2.0000 mg | INTRAVENOUS | Status: DC | PRN

## 2019-05-08 MED ORDER — ONDANSETRON HCL 4 MG/2ML IJ SOLN: 4.0000 mg | Freq: Four times a day (QID) | INTRAMUSCULAR | Status: DC | PRN

## 2019-05-08 MED ORDER — ROSUVASTATIN CALCIUM 10 MG PO TABS: 10.0000 mg | ORAL_TABLET | Freq: Every day | ORAL | 11 refills | Status: DC

## 2019-05-08 MED ORDER — ACETAMINOPHEN 325 MG PO TABS: 650.0000 mg | ORAL_TABLET | ORAL | Status: DC | PRN

## 2019-05-08 MED ORDER — HYDRALAZINE HCL 20 MG/ML IJ SOLN
INTRAMUSCULAR | Status: DC | PRN
  Administered 2019-05-08: 10 mg via INTRAVENOUS

## 2019-05-08 MED ORDER — SODIUM CHLORIDE 0.9 % IV SOLN
INTRAVENOUS | Status: DC
  Administered 2019-05-08: 06:00:00 via INTRAVENOUS

## 2019-05-08 MED ORDER — LIDOCAINE HCL (PF) 1 % IJ SOLN
INTRAMUSCULAR | Status: AC
  Filled 2019-05-08: qty 30

## 2019-05-08 MED ORDER — FENTANYL CITRATE (PF) 100 MCG/2ML IJ SOLN
INTRAMUSCULAR | Status: DC | PRN
  Administered 2019-05-08 (×2): 25 ug via INTRAVENOUS

## 2019-05-08 MED ORDER — LIDOCAINE HCL (PF) 1 % IJ SOLN
INTRAMUSCULAR | Status: DC | PRN
  Administered 2019-05-08: 10 mL via INTRADERMAL

## 2019-05-08 MED ORDER — CLOPIDOGREL BISULFATE 75 MG PO TABS: 75.0000 mg | ORAL_TABLET | Freq: Every day | ORAL | Status: DC

## 2019-05-08 MED ORDER — HEPARIN (PORCINE) IN NACL 1000-0.9 UT/500ML-% IV SOLN
INTRAVENOUS | Status: AC
  Filled 2019-05-08: qty 1000

## 2019-05-08 MED ORDER — SODIUM CHLORIDE 0.9% FLUSH: 3.0000 mL | INTRAVENOUS | Status: DC | PRN

## 2019-05-08 MED ORDER — MIDAZOLAM HCL 2 MG/2ML IJ SOLN
INTRAMUSCULAR | Status: AC
  Filled 2019-05-08: qty 2

## 2019-05-08 MED ORDER — MIDAZOLAM HCL 2 MG/2ML IJ SOLN
INTRAMUSCULAR | Status: DC | PRN
  Administered 2019-05-08 (×2): 1 mg via INTRAVENOUS

## 2019-05-08 MED ORDER — LABETALOL HCL 5 MG/ML IV SOLN: 10.0000 mg | INTRAVENOUS | Status: DC | PRN

## 2019-05-08 MED ORDER — HEPARIN (PORCINE) IN NACL 1000-0.9 UT/500ML-% IV SOLN
INTRAVENOUS | Status: DC | PRN
  Administered 2019-05-08 (×2): 500 mL

## 2019-05-08 SURGICAL SUPPLY — 31 items
BALLN MUSTANG 4X150X135 (BALLOONS) ×3
BALLN MUSTANG 5X200X135 (BALLOONS) ×3
BALLN MUSTANG 6X80X135 (BALLOONS) ×3
BALLOON MUSTANG 4X150X135 (BALLOONS) ×2 IMPLANT
BALLOON MUSTANG 5X200X135 (BALLOONS) ×2 IMPLANT
BALLOON MUSTANG 6X80X135 (BALLOONS) ×2 IMPLANT
CANISTER PENUMBRA ENGINE (MISCELLANEOUS) ×3 IMPLANT
CATH INDIGO CAT6 KIT (CATHETERS) ×3 IMPLANT
CATH OMNI FLUSH 5F 65CM (CATHETERS) ×3 IMPLANT
CATH QUICKCROSS SUPP .035X90CM (MICROCATHETER) ×3 IMPLANT
CLOSURE MYNX CONTROL 6F/7F (Vascular Products) ×3 IMPLANT
DEVICE TORQUE H2O (MISCELLANEOUS) ×3 IMPLANT
GUIDEWIRE ANGLED .035X150CM (WIRE) ×3 IMPLANT
KIT ENCORE 26 ADVANTAGE (KITS) ×3 IMPLANT
KIT MICROPUNCTURE NIT STIFF (SHEATH) ×3 IMPLANT
KIT PV (KITS) ×3 IMPLANT
SHEATH PINNACLE 5F 10CM (SHEATH) ×3 IMPLANT
SHEATH PINNACLE 7F 10CM (SHEATH) ×3 IMPLANT
SHEATH PINNACLE MP 7F 45CM (SHEATH) ×3 IMPLANT
SHEATH PROBE COVER 6X72 (BAG) ×3 IMPLANT
SHIELD RADPAD SCOOP 12X17 (MISCELLANEOUS) ×3 IMPLANT
STENT ELUVIA 6X120X130 (Permanent Stent) ×9 IMPLANT
STENT ELUVIA 6X60X130 (Permanent Stent) ×3 IMPLANT
SYR MEDRAD MARK V 150ML (SYRINGE) ×3 IMPLANT
TAPE VIPERTRACK RADIOPAQ (MISCELLANEOUS) ×2 IMPLANT
TAPE VIPERTRACK RADIOPAQUE (MISCELLANEOUS) ×2
TRANSDUCER W/STOPCOCK (MISCELLANEOUS) ×3 IMPLANT
TRAY PV CATH (CUSTOM PROCEDURE TRAY) ×3 IMPLANT
WIRE BENTSON .035X145CM (WIRE) ×3 IMPLANT
WIRE G V18X300CM (WIRE) ×3 IMPLANT
WIRE HI TORQ VERSACORE 300 (WIRE) ×3 IMPLANT

## 2019-05-08 NOTE — Interval H&P Note (Signed)
History and Physical Interval Note:  05/08/2019 7:39 AM  Michaela Simpson  has presented today for surgery, with the diagnosis of pvd w/ ulcer right lower ext.  The various methods of treatment have been discussed with the patient and family. After consideration of risks, benefits and other options for treatment, the patient has consented to  Procedure(s): ABDOMINAL AORTOGRAM W/LOWER EXTREMITY (N/A) as a surgical intervention.  The patient's history has been reviewed, patient examined, no change in status, stable for surgery.  I have reviewed the patient's chart and labs.  Questions were answered to the patient's satisfaction.     Annamarie Major

## 2019-05-08 NOTE — Progress Notes (Signed)
Vomited small amt of food particles Dr Trula Slade called and new orders noted

## 2019-05-08 NOTE — Progress Notes (Addendum)
Pt states the nausea is better. Ambulated to bathroom tol well

## 2019-05-08 NOTE — Discharge Instructions (Signed)
Angiogram, Care After °This sheet gives you information about how to care for yourself after your procedure. Your doctor may also give you more specific instructions. If you have problems or questions, contact your doctor. °Follow these instructions at home: °Insertion site care °· Follow instructions from your doctor about how to take care of your long, thin tube (catheter) insertion area. Make sure you: °? Wash your hands with soap and water before you change your bandage (dressing). If you cannot use soap and water, use hand sanitizer. °? Change your bandage as told by your doctor. °? Leave stitches (sutures), skin glue, or skin tape (adhesive) strips in place. They may need to stay in place for 2 weeks or longer. If tape strips get loose and curl up, you may trim the loose edges. Do not remove tape strips completely unless your doctor says it is okay. °· Do not take baths, swim, or use a hot tub until your doctor says it is okay. °· You may shower 24-48 hours after the procedure or as told by your doctor. °? Gently wash the area with plain soap and water. °? Pat the area dry with a clean towel. °? Do not rub the area. This may cause bleeding. °· Do not apply powder or lotion to the area. Keep the area clean and dry. °· Check your insertion area every day for signs of infection. Check for: °? More redness, swelling, or pain. °? Fluid or blood. °? Warmth. °? Pus or a bad smell. °Activity °· Rest as told by your doctor, usually for 1-2 days. °· Do not lift anything that is heavier than 10 lbs. (4.5 kg) or as told by your doctor. °· Do not drive for 24 hours if you were given a medicine to help you relax (sedative). °· Do not drive or use heavy machinery while taking prescription pain medicine. °General instructions ° °· Go back to your normal activities as told by your doctor, usually in about a week. Ask your doctor what activities are safe for you. °· If the insertion area starts to bleed, lie flat and put  pressure on the area. If the bleeding does not stop, get help right away. This is an emergency. °· Drink enough fluid to keep your pee (urine) clear or pale yellow. °· Take over-the-counter and prescription medicines only as told by your doctor. °· Keep all follow-up visits as told by your doctor. This is important. °Contact a doctor if: °· You have a fever. °· You have chills. °· You have more redness, swelling, or pain around your insertion area. °· You have fluid or blood coming from your insertion area. °· The insertion area feels warm to the touch. °· You have pus or a bad smell coming from your insertion area. °· You have more bruising around the insertion area. °· Blood collects in the tissue around the insertion area (hematoma) that may be painful to the touch. °Get help right away if: °· You have a lot of pain in the insertion area. °· The insertion area swells very fast. °· The insertion area is bleeding, and the bleeding does not stop after holding steady pressure on the area. °· The area near or just beyond the insertion area becomes pale, cool, tingly, or numb. °These symptoms may be an emergency. Do not wait to see if the symptoms will go away. Get medical help right away. Call your local emergency services (911 in the [place] Do not drive yourself to the hospital. °  Summary °· After the procedure, it is common to have bruising and tenderness at the long, thin tube insertion area. °· After the procedure, it is important to rest and drink plenty of fluids. °· Do not take baths, swim, or use a hot tub until your doctor says it is okay to do so. You may shower 24-48 hours after the procedure or as told by your doctor. °· If the insertion area starts to bleed, lie flat and put pressure on the area. If the bleeding does not stop, get help right away. This is an emergency. °This information is not intended to replace advice given to you by your health care provider. Make sure you discuss any questions you have  with your health care provider. °Document Released: 09/03/2008 Document Revised: 05/20/2017 Document Reviewed: 06/01/2016 °Elsevier Patient Education © 2020 [employer] °Femoral Site Care °This sheet gives you information about how to care for yourself after your procedure. Your health care provider may also give you more specific instructions. If you have problems or questions, contact your health care provider. °What can I expect after the procedure? °After the procedure, it is common to have: °· Bruising that usually fades within 1-2 weeks. °· Tenderness at the site. °Follow these instructions at home: °Wound care °· Follow instructions from your health care provider about how to take care of your insertion site. Make sure you: °? Wash your hands with soap and water before you change your bandage (dressing). If soap and water are not available, use hand sanitizer. °? Change your dressing as told by your health care provider. °? Leave stitches (sutures), skin glue, or adhesive strips in place. These skin closures may need to stay in place for 2 weeks or longer. If adhesive strip edges start to loosen and curl up, you may trim the loose edges. Do not remove adhesive strips completely unless your health care provider tells you to do that. °· Do not take baths, swim, or use a hot tub until your health care provider approves. °· You may shower 24-48 hours after the procedure or as told by your health care provider. °? Gently wash the site with plain soap and water. °? Pat the area dry with a clean towel. °? Do not rub the site. This may cause bleeding. °· Do not apply powder or lotion to the site. Keep the site clean and dry. °· Check your femoral site every day for signs of infection. Check for: °? Redness, swelling, or pain. °? Fluid or blood. °? Warmth. °? Pus or a bad smell. °Activity °· For the first 2-3 days after your procedure, or as long as directed: °? Avoid climbing stairs as much as possible. °? Do not  squat. °· Do not lift anything that is heavier than 10 lb (4.5 kg), or the limit that you are told, until your health care provider says that it is safe. °· Rest as directed. °? Avoid sitting for a long time without moving. Get up to take short walks every 1-2 hours. °· Do not drive for 24 hours if you were given a medicine to help you relax (sedative). °General instructions °· Take over-the-counter and prescription medicines only as told by your health care provider. °· Keep all follow-up visits as told by your health care provider. This is important. °Contact a health care provider if you have: °· A fever or chills. °· You have redness, swelling, or pain around your insertion site. °Get help right away if: °· The   catheter insertion area swells very fast. °· You pass out. °· You suddenly start to sweat or your skin gets clammy. °· The catheter insertion area is bleeding, and the bleeding does not stop when you hold steady pressure on the area. °· The area near or just beyond the catheter insertion site becomes pale, cool, tingly, or numb. °These symptoms may represent a serious problem that is an emergency. Do not wait to see if the symptoms will go away. Get medical help right away. Call your local emergency services (911 in the [place] Do not drive yourself to the hospital. °Summary °· After the procedure, it is common to have bruising that usually fades within 1-2 weeks. °· Check your femoral site every day for signs of infection. °· Do not lift anything that is heavier than 10 lb (4.5 kg), or the limit that you are told, until your health care provider says that it is safe. °This information is not intended to replace advice given to you by your health care provider. Make sure you discuss any questions you have with your health care provider. °Document Released: 02/08/2014 Document Revised: 06/20/2017 Document Reviewed: 06/20/2017 °Elsevier Patient Education © 2020 Elsevier Inc. ° °

## 2019-05-08 NOTE — Op Note (Signed)
Patient name: Michaela Simpson MRN: [id number] DOB: [birth year] Sex: female  05/08/2019 Pre-operative Diagnosis: right leg ulcer Post-operative diagnosis:  Same Surgeon:  Annamarie Major Procedure Performed:  1.  U/s guided access left femoral artery  2.  Abdominal aortogram  3.  Bilateral lower extremity runoff  4.  Stent, right superficial femoral-popliteal artery  5.  Mechanical thrombectomy of the right superficial femoral-popliteal artery using the penumbra CAT 6 device  6.  Closure device, Mynx  7.  Conscious sedation 88 minutes   Indications: The patient has had a right leg ulcer for approximately 1 year.  This has yet to heal.  Her ABIs showed decreased blood flow and so she comes in today for further evaluation and possible intervention  Procedure:  The patient was identified in the holding area and taken to room 8.  The patient was then placed supine on the table and prepped and draped in the usual sterile fashion.  A time out was called.  Conscious sedation was administered with the use of IV fentanyl and Versed under continuous physician and nurse monitoring.  Heart rate, blood pressure, and oxygen saturation were continuously monitored.  Total sedation time was 88 minutes.  Ultrasound was used to evaluate the left common femoral artery.  It was patent .  A digital ultrasound image was acquired.  A micropuncture needle was used to access the left common femoral artery under ultrasound guidance.  An 018 wire was advanced without resistance and a micropuncture sheath was placed.  The 018 wire was removed and a benson wire was placed.  The micropuncture sheath was exchanged for a 5 french sheath.  An omniflush catheter was advanced over the wire to the level of L-1.  An abdominal angiogram was obtained.  Next the catheter was pulled down to the aortic bifurcation and bilateral runoff was obtained  Findings:   Aortogram: No significant stenosis noted in the celiac, superior  mesenteric, or bilateral renal arteries.  The infrarenal abdominal aorta is widely patent without stenosis.  Bilateral common and external iliac arteries are widely patent.  Right Lower Extremity: The right common femoral and profundofemoral artery are patent without stenosis.  The superficial femoral artery is occluded just beyond its origin with reconstitution at the level of the patella.  The below-knee popliteal artery is patent with three-vessel runoff to the ankle.  The posterior tibial is the dominant runoff across the ankle.  Left Lower Extremity: The left common femoral profundofemoral and superficial femoral artery are widely patent without stenosis.  The popliteal artery is patent throughout its course with three-vessel runoff  Intervention: After the above images were acquired the decision was made to proceed with intervention.  Using a Omni Flush catheter and a Bentson wire, the aortic bifurcation was crossed.  The 5 French sheath was removed and a 7 French 45 cm Terumo sheath was inserted.  Patient was fully heparinized.  Heparin levels were monitored with ACT measurements.  Using a 035 Glidewire and a quick cross catheter, subintimal recanalization was performed with reentry confirmed at the level of the patella.  The reentry injection identified mobile thrombus at the reentry site.  I then inserted a V 18 wire and performed mechanical thrombectomy using the penumbra CAT 6 device.  I then predilated the subintimal tract with a 4 mm balloon and placed overlapping 6 mm Elluvia drug-eluting stents.  I placed three 6 x 120 and one 6 x 80.  The stents were then molded with a 5  and 6 mm balloon.  Completion imaging revealed a widely patent superficial femoral-popliteal artery with continued three-vessel runoff to the ankle with the posterior tibial being the dominant vessel across the ankle.  Next the long sheath was exchanged out for short 7 and a minx device was used for closure.  Impression:  #1   Occluded right superficial femoral artery successfully recanalized and stented using 6 mm Elluvia stents.  #2  Mechanical thrombectomy of the superficial femoral-popliteal artery was performed after recanalization confirmation revealed mobile thrombus at the reentry site.  There was no residual thrombus after thrombectomy.    Theotis Burrow, M.D., [facility] [place] Office: [phone] Pager:  [phone]

## 2019-05-08 NOTE — Progress Notes (Signed)
Discharge instructions reviewed with pt and her daughter both voice understanding.  

## 2019-05-08 NOTE — Interval H&P Note (Signed)
History and Physical Interval Note:  05/08/2019 7:33 AM  Michaela Simpson  has presented today for surgery, with the diagnosis of pvd w/ ulcer right lower ext.  The various methods of treatment have been discussed with the patient and family. After consideration of risks, benefits and other options for treatment, the patient has consented to  Procedure(s): ABDOMINAL AORTOGRAM W/LOWER EXTREMITY (N/A) as a surgical intervention.  The patient's history has been reviewed, patient examined, no change in status, stable for surgery.  I have reviewed the patient's chart and labs.  Questions were answered to the patient's satisfaction.     Annamarie Major

## 2019-05-09 ENCOUNTER — Encounter (HOSPITAL_COMMUNITY): Payer: Self-pay | Admitting: Surgery

## 2019-05-09 ENCOUNTER — Telehealth: Payer: Self-pay | Admitting: *Deleted

## 2019-05-09 DIAGNOSIS — M810 Age-related osteoporosis without current pathological fracture: Secondary | ICD-10-CM | POA: Diagnosis not present

## 2019-05-09 DIAGNOSIS — F329 Major depressive disorder, single episode, unspecified: Secondary | ICD-10-CM | POA: Diagnosis not present

## 2019-05-09 DIAGNOSIS — W19XXXD Unspecified fall, subsequent encounter: Secondary | ICD-10-CM | POA: Diagnosis not present

## 2019-05-09 DIAGNOSIS — Z7982 Long term (current) use of aspirin: Secondary | ICD-10-CM | POA: Diagnosis not present

## 2019-05-09 DIAGNOSIS — S72002D Fracture of unspecified part of neck of left femur, subsequent encounter for closed fracture with routine healing: Secondary | ICD-10-CM | POA: Diagnosis not present

## 2019-05-09 DIAGNOSIS — S42302D Unspecified fracture of shaft of humerus, left arm, subsequent encounter for fracture with routine healing: Secondary | ICD-10-CM | POA: Diagnosis not present

## 2019-05-09 DIAGNOSIS — Z9181 History of falling: Secondary | ICD-10-CM | POA: Diagnosis not present

## 2019-05-09 DIAGNOSIS — I10 Essential (primary) hypertension: Secondary | ICD-10-CM | POA: Diagnosis not present

## 2019-05-09 MED FILL — Hydralazine HCl Inj 20 MG/ML: INTRAMUSCULAR | Qty: 1 | Status: AC

## 2019-05-09 NOTE — Telephone Encounter (Signed)
Call from patient's daughter about removing dressing and "small hematoma about the size of a nickel". Advised to mark and watch the area, call office if increase in size. Expect bruising. Go to [facility] ER if any acute changes.

## 2019-05-11 DIAGNOSIS — I10 Essential (primary) hypertension: Secondary | ICD-10-CM | POA: Diagnosis not present

## 2019-05-11 DIAGNOSIS — S42302D Unspecified fracture of shaft of humerus, left arm, subsequent encounter for fracture with routine healing: Secondary | ICD-10-CM | POA: Diagnosis not present

## 2019-05-11 DIAGNOSIS — W19XXXD Unspecified fall, subsequent encounter: Secondary | ICD-10-CM | POA: Diagnosis not present

## 2019-05-11 DIAGNOSIS — F329 Major depressive disorder, single episode, unspecified: Secondary | ICD-10-CM | POA: Diagnosis not present

## 2019-05-11 DIAGNOSIS — M810 Age-related osteoporosis without current pathological fracture: Secondary | ICD-10-CM | POA: Diagnosis not present

## 2019-05-11 DIAGNOSIS — S72002D Fracture of unspecified part of neck of left femur, subsequent encounter for closed fracture with routine healing: Secondary | ICD-10-CM | POA: Diagnosis not present

## 2019-05-14 DIAGNOSIS — F329 Major depressive disorder, single episode, unspecified: Secondary | ICD-10-CM | POA: Diagnosis not present

## 2019-05-14 DIAGNOSIS — W19XXXD Unspecified fall, subsequent encounter: Secondary | ICD-10-CM | POA: Diagnosis not present

## 2019-05-14 DIAGNOSIS — S42302D Unspecified fracture of shaft of humerus, left arm, subsequent encounter for fracture with routine healing: Secondary | ICD-10-CM | POA: Diagnosis not present

## 2019-05-14 DIAGNOSIS — M810 Age-related osteoporosis without current pathological fracture: Secondary | ICD-10-CM | POA: Diagnosis not present

## 2019-05-14 DIAGNOSIS — S72002D Fracture of unspecified part of neck of left femur, subsequent encounter for closed fracture with routine healing: Secondary | ICD-10-CM | POA: Diagnosis not present

## 2019-05-14 DIAGNOSIS — I10 Essential (primary) hypertension: Secondary | ICD-10-CM | POA: Diagnosis not present

## 2019-05-15 ENCOUNTER — Telehealth: Payer: Self-pay | Admitting: *Deleted

## 2019-05-15 DIAGNOSIS — I10 Essential (primary) hypertension: Secondary | ICD-10-CM | POA: Diagnosis not present

## 2019-05-15 DIAGNOSIS — W19XXXD Unspecified fall, subsequent encounter: Secondary | ICD-10-CM | POA: Diagnosis not present

## 2019-05-15 DIAGNOSIS — M810 Age-related osteoporosis without current pathological fracture: Secondary | ICD-10-CM | POA: Diagnosis not present

## 2019-05-15 DIAGNOSIS — S72002D Fracture of unspecified part of neck of left femur, subsequent encounter for closed fracture with routine healing: Secondary | ICD-10-CM | POA: Diagnosis not present

## 2019-05-15 DIAGNOSIS — F329 Major depressive disorder, single episode, unspecified: Secondary | ICD-10-CM | POA: Diagnosis not present

## 2019-05-15 DIAGNOSIS — S42302D Unspecified fracture of shaft of humerus, left arm, subsequent encounter for fracture with routine healing: Secondary | ICD-10-CM | POA: Diagnosis not present

## 2019-05-15 NOTE — Telephone Encounter (Signed)
Call back to patient's daughter Arrie Aran. No answer left message.

## 2019-05-18 DIAGNOSIS — F329 Major depressive disorder, single episode, unspecified: Secondary | ICD-10-CM | POA: Diagnosis not present

## 2019-05-18 DIAGNOSIS — I10 Essential (primary) hypertension: Secondary | ICD-10-CM | POA: Diagnosis not present

## 2019-05-18 DIAGNOSIS — W19XXXD Unspecified fall, subsequent encounter: Secondary | ICD-10-CM | POA: Diagnosis not present

## 2019-05-18 DIAGNOSIS — M810 Age-related osteoporosis without current pathological fracture: Secondary | ICD-10-CM | POA: Diagnosis not present

## 2019-05-18 DIAGNOSIS — S72002D Fracture of unspecified part of neck of left femur, subsequent encounter for closed fracture with routine healing: Secondary | ICD-10-CM | POA: Diagnosis not present

## 2019-05-18 DIAGNOSIS — S42302D Unspecified fracture of shaft of humerus, left arm, subsequent encounter for fracture with routine healing: Secondary | ICD-10-CM | POA: Diagnosis not present

## 2019-05-23 DIAGNOSIS — S72002D Fracture of unspecified part of neck of left femur, subsequent encounter for closed fracture with routine healing: Secondary | ICD-10-CM | POA: Diagnosis not present

## 2019-05-23 DIAGNOSIS — I10 Essential (primary) hypertension: Secondary | ICD-10-CM | POA: Diagnosis not present

## 2019-05-23 DIAGNOSIS — W19XXXD Unspecified fall, subsequent encounter: Secondary | ICD-10-CM | POA: Diagnosis not present

## 2019-05-23 DIAGNOSIS — F329 Major depressive disorder, single episode, unspecified: Secondary | ICD-10-CM | POA: Diagnosis not present

## 2019-05-23 DIAGNOSIS — S42302D Unspecified fracture of shaft of humerus, left arm, subsequent encounter for fracture with routine healing: Secondary | ICD-10-CM | POA: Diagnosis not present

## 2019-05-23 DIAGNOSIS — M810 Age-related osteoporosis without current pathological fracture: Secondary | ICD-10-CM | POA: Diagnosis not present

## 2019-05-24 ENCOUNTER — Encounter: Payer: Private Health Insurance - Indemnity | Admitting: Vascular Surgery

## 2019-05-25 DIAGNOSIS — W19XXXD Unspecified fall, subsequent encounter: Secondary | ICD-10-CM | POA: Diagnosis not present

## 2019-05-25 DIAGNOSIS — I10 Essential (primary) hypertension: Secondary | ICD-10-CM | POA: Diagnosis not present

## 2019-05-25 DIAGNOSIS — M810 Age-related osteoporosis without current pathological fracture: Secondary | ICD-10-CM | POA: Diagnosis not present

## 2019-05-25 DIAGNOSIS — F329 Major depressive disorder, single episode, unspecified: Secondary | ICD-10-CM | POA: Diagnosis not present

## 2019-05-25 DIAGNOSIS — S42302D Unspecified fracture of shaft of humerus, left arm, subsequent encounter for fracture with routine healing: Secondary | ICD-10-CM | POA: Diagnosis not present

## 2019-05-25 DIAGNOSIS — S72002D Fracture of unspecified part of neck of left femur, subsequent encounter for closed fracture with routine healing: Secondary | ICD-10-CM | POA: Diagnosis not present

## 2019-05-31 ENCOUNTER — Telehealth: Payer: Self-pay | Admitting: *Deleted

## 2019-05-31 DIAGNOSIS — I10 Essential (primary) hypertension: Secondary | ICD-10-CM | POA: Diagnosis not present

## 2019-05-31 DIAGNOSIS — W19XXXD Unspecified fall, subsequent encounter: Secondary | ICD-10-CM | POA: Diagnosis not present

## 2019-05-31 DIAGNOSIS — L97909 Non-pressure chronic ulcer of unspecified part of unspecified lower leg with unspecified severity: Secondary | ICD-10-CM | POA: Diagnosis not present

## 2019-05-31 DIAGNOSIS — M810 Age-related osteoporosis without current pathological fracture: Secondary | ICD-10-CM | POA: Diagnosis not present

## 2019-05-31 DIAGNOSIS — F329 Major depressive disorder, single episode, unspecified: Secondary | ICD-10-CM | POA: Diagnosis not present

## 2019-05-31 DIAGNOSIS — S72002D Fracture of unspecified part of neck of left femur, subsequent encounter for closed fracture with routine healing: Secondary | ICD-10-CM | POA: Diagnosis not present

## 2019-05-31 DIAGNOSIS — S42302D Unspecified fracture of shaft of humerus, left arm, subsequent encounter for fracture with routine healing: Secondary | ICD-10-CM | POA: Diagnosis not present

## 2019-05-31 NOTE — Telephone Encounter (Signed)
Patient called c/o continued "discomfort" at groin post arteriogram with intervention approx. 3 weeks ago. States she has a small hard spot there, no bleeding, no swelling. Dr. Loni Muse wound care center saw it this am and told her it was completely normal.I offered her an appointment to come in for wound check, but they have decided to wait until 12/21 appt. At [facility]. Instructed to call triage back if needed.

## 2019-06-01 DIAGNOSIS — F329 Major depressive disorder, single episode, unspecified: Secondary | ICD-10-CM | POA: Diagnosis not present

## 2019-06-01 DIAGNOSIS — M810 Age-related osteoporosis without current pathological fracture: Secondary | ICD-10-CM | POA: Diagnosis not present

## 2019-06-01 DIAGNOSIS — S72002D Fracture of unspecified part of neck of left femur, subsequent encounter for closed fracture with routine healing: Secondary | ICD-10-CM | POA: Diagnosis not present

## 2019-06-01 DIAGNOSIS — S42302D Unspecified fracture of shaft of humerus, left arm, subsequent encounter for fracture with routine healing: Secondary | ICD-10-CM | POA: Diagnosis not present

## 2019-06-01 DIAGNOSIS — W19XXXD Unspecified fall, subsequent encounter: Secondary | ICD-10-CM | POA: Diagnosis not present

## 2019-06-01 DIAGNOSIS — I10 Essential (primary) hypertension: Secondary | ICD-10-CM | POA: Diagnosis not present

## 2019-06-04 DIAGNOSIS — S42302D Unspecified fracture of shaft of humerus, left arm, subsequent encounter for fracture with routine healing: Secondary | ICD-10-CM | POA: Diagnosis not present

## 2019-06-04 DIAGNOSIS — F329 Major depressive disorder, single episode, unspecified: Secondary | ICD-10-CM | POA: Diagnosis not present

## 2019-06-04 DIAGNOSIS — M810 Age-related osteoporosis without current pathological fracture: Secondary | ICD-10-CM | POA: Diagnosis not present

## 2019-06-04 DIAGNOSIS — S72002D Fracture of unspecified part of neck of left femur, subsequent encounter for closed fracture with routine healing: Secondary | ICD-10-CM | POA: Diagnosis not present

## 2019-06-04 DIAGNOSIS — I10 Essential (primary) hypertension: Secondary | ICD-10-CM | POA: Diagnosis not present

## 2019-06-04 DIAGNOSIS — W19XXXD Unspecified fall, subsequent encounter: Secondary | ICD-10-CM | POA: Diagnosis not present

## 2019-06-06 ENCOUNTER — Ambulatory Visit (INDEPENDENT_AMBULATORY_CARE_PROVIDER_SITE_OTHER): Payer: Medicare Other | Admitting: Orthopaedic Surgery

## 2019-06-06 ENCOUNTER — Encounter: Payer: Self-pay | Admitting: Orthopaedic Surgery

## 2019-06-06 ENCOUNTER — Ambulatory Visit (INDEPENDENT_AMBULATORY_CARE_PROVIDER_SITE_OTHER): Payer: Private Health Insurance - Indemnity

## 2019-06-06 ENCOUNTER — Other Ambulatory Visit: Payer: Self-pay

## 2019-06-06 ENCOUNTER — Ambulatory Visit: Payer: Medicare Other | Admitting: Orthopaedic Surgery

## 2019-06-06 DIAGNOSIS — M25512 Pain in left shoulder: Secondary | ICD-10-CM

## 2019-06-06 DIAGNOSIS — I10 Essential (primary) hypertension: Secondary | ICD-10-CM | POA: Diagnosis not present

## 2019-06-06 DIAGNOSIS — S72002D Fracture of unspecified part of neck of left femur, subsequent encounter for closed fracture with routine healing: Secondary | ICD-10-CM

## 2019-06-06 DIAGNOSIS — W19XXXD Unspecified fall, subsequent encounter: Secondary | ICD-10-CM | POA: Diagnosis not present

## 2019-06-06 DIAGNOSIS — M810 Age-related osteoporosis without current pathological fracture: Secondary | ICD-10-CM | POA: Diagnosis not present

## 2019-06-06 DIAGNOSIS — F329 Major depressive disorder, single episode, unspecified: Secondary | ICD-10-CM | POA: Diagnosis not present

## 2019-06-06 DIAGNOSIS — S42302D Unspecified fracture of shaft of humerus, left arm, subsequent encounter for fracture with routine healing: Secondary | ICD-10-CM | POA: Diagnosis not present

## 2019-06-06 NOTE — Progress Notes (Signed)
The patient is an 85 year old female who is now 3 months status post cannulated screw fixation of a left femoral neck fracture.  She is also been dealing with a healing left proximal humerus fracture.  She enters with a cane.  She is ready to drive she states.  She is still experiencing left hip pain.  On examination there is definitely weakness in her left shoulder but it moves as a unit and is well located.  Clinically it appears to have healed.  Examination of her left hip still shows pain with internal and external rotation of that hip.  3 views left shoulder shows a proximal femur fracture that is healed.  The shoulder is well located.  2 views left hip show a healing to healed femoral neck fracture.  There is some early evidence of possible osteonecrosis with slight irregularity of the femoral head.  At this point she will continue weightbearing as tolerated.  I talked to her and her daughter about the possibility of her hip not healing and heading to more osteonecrosis that would require hip replacement.  For now, we will hold off on any type of intervention until we make sure that she feels this completely ago she may heal this but it certainly could be taking a while due to her age.  I would like to see her back in 3 months.  At that visit I like just an AP and lateral of the left hip.

## 2019-06-08 DIAGNOSIS — S42302D Unspecified fracture of shaft of humerus, left arm, subsequent encounter for fracture with routine healing: Secondary | ICD-10-CM | POA: Diagnosis not present

## 2019-06-08 DIAGNOSIS — Z79891 Long term (current) use of opiate analgesic: Secondary | ICD-10-CM | POA: Diagnosis not present

## 2019-06-08 DIAGNOSIS — M810 Age-related osteoporosis without current pathological fracture: Secondary | ICD-10-CM | POA: Diagnosis not present

## 2019-06-08 DIAGNOSIS — Z9181 History of falling: Secondary | ICD-10-CM | POA: Diagnosis not present

## 2019-06-08 DIAGNOSIS — I10 Essential (primary) hypertension: Secondary | ICD-10-CM | POA: Diagnosis not present

## 2019-06-08 DIAGNOSIS — S72002D Fracture of unspecified part of neck of left femur, subsequent encounter for closed fracture with routine healing: Secondary | ICD-10-CM | POA: Diagnosis not present

## 2019-06-08 DIAGNOSIS — F329 Major depressive disorder, single episode, unspecified: Secondary | ICD-10-CM | POA: Diagnosis not present

## 2019-06-08 DIAGNOSIS — Z7982 Long term (current) use of aspirin: Secondary | ICD-10-CM | POA: Diagnosis not present

## 2019-06-08 DIAGNOSIS — S81831D Puncture wound without foreign body, right lower leg, subsequent encounter: Secondary | ICD-10-CM | POA: Diagnosis not present

## 2019-06-10 ENCOUNTER — Other Ambulatory Visit: Payer: Self-pay

## 2019-06-10 DIAGNOSIS — I70233 Atherosclerosis of native arteries of right leg with ulceration of ankle: Secondary | ICD-10-CM

## 2019-06-11 ENCOUNTER — Encounter: Payer: Self-pay | Admitting: Surgery

## 2019-06-11 ENCOUNTER — Other Ambulatory Visit: Payer: Self-pay

## 2019-06-11 ENCOUNTER — Ambulatory Visit (HOSPITAL_COMMUNITY)
Admission: RE | Admit: 2019-06-11 | Discharge: 2019-06-11 | Disposition: A | Discharge status: Home / Self Care | Payer: Medicare Other | Source: Ambulatory Visit | Attending: Surgery | Admitting: Surgery

## 2019-06-11 ENCOUNTER — Ambulatory Visit (INDEPENDENT_AMBULATORY_CARE_PROVIDER_SITE_OTHER): Payer: Medicare Other | Admitting: Surgery

## 2019-06-11 ENCOUNTER — Ambulatory Visit (INDEPENDENT_AMBULATORY_CARE_PROVIDER_SITE_OTHER)
Admission: RE | Admit: 2019-06-11 | Discharge: 2019-06-11 | Disposition: A | Discharge status: Home / Self Care | Payer: Medicare Other | Source: Ambulatory Visit | Attending: Surgery | Admitting: Surgery

## 2019-06-11 VITALS — BP 127/67 | HR 68 | Temp 97.6°F | Resp 20 | Ht 62.0 in | Wt 110.0 lb

## 2019-06-11 DIAGNOSIS — I70233 Atherosclerosis of native arteries of right leg with ulceration of ankle: Secondary | ICD-10-CM

## 2019-06-11 NOTE — Progress Notes (Signed)
Vascular and Vein Specialist of [place] [facility]  Patient name: Michaela Simpson MRN: [id number] DOB: [birth year] Sex: female   REASON FOR VISIT:    Follow up  [place]:   Michaela Simpson is a 85 y.o. female, who is valuation of her blood flow to the right leg.  The patient has had a ulcer on her right shin for about 1 year.  She recently started going ot the wound center.  At her first visit she slipped and fell and broke her left hip.  The patient had a cardiac echo prior to her orthopedic procedure and was found to have critical aortic stenosis.  She is scheduled for TAVR in January she is medically managed for hypertension.  She is a non-smoker.  On 05/08/2019 she underwent right leg angiogram.  She was found to have an occluded right superficial femoral artery which was successfully recanalized and stented.  She is doing well since her procedure.  She does note some left groin pain.  She did have some swelling in her right leg after the procedure but that is resolved.  Recently she has been feeling tired.  She did have a nosebleed as well as blood in her stool.  She is on dual antiplatelet therapy.  She was also started on a statin after her procedure.   PAST MEDICAL HISTORY:   Past Medical History:  Diagnosis Date  . AKI (acute kidney injury) ([place])   . Anemia   . Aortic stenosis   . Depression   . Hip fracture ([place])   . HTN (hypertension)   . Humerus fracture   . Leukocytosis   . Osteoporosis   . Rheumatoid arthritis ([place])   . Ulcer of lower extremity ([place])      FAMILY HISTORY:   Family History  Problem Relation Age of Onset  . Cancer Sister     SOCIAL HISTORY:   Social History   Tobacco Use  . Smoking status: Never Smoker  . Smokeless tobacco: Never Used  Substance Use Topics  . Alcohol use: No     ALLERGIES:   Allergies  Allergen Reactions  . Bee Pollen   . Penicillins Nausea And Vomiting  and Other (See Comments)    Child hood allergy - Flu symptoms; constant vomiting  Did it involve swelling of the face/tongue/throat, SOB, or low BP? Unknown Did it involve sudden or severe rash/hives, skin peeling, or any reaction on the inside of your mouth or nose? Unknown Did you need to seek medical attention at a hospital or doctor's office? Unknown When did it last happen? If all above answers are "NO", may proceed with cephalosporin use.   . Codeine Nausea And Vomiting and Rash     CURRENT MEDICATIONS:   Current Outpatient Medications  Medication Sig Dispense Refill  . acetaminophen (TYLENOL) 325 MG tablet Take 2 tablets (650 mg total) by mouth every 6 (six) hours as needed for mild pain (or Fever >/= 101).    Marland Kitchen amLODipine (NORVASC) 2.5 MG tablet Take 1 tablet (2.5 mg total) by mouth at bedtime. 30 tablet 0  . clopidogrel (PLAVIX) 75 MG tablet Take 1 tablet (75 mg total) by mouth daily. 30 tablet 11  . Lactobacillus (PROBIOTIC ACIDOPHILUS PO) Take 1 tablet by mouth daily.    Marland Kitchen lactose free nutrition (BOOST) LIQD Take 237 mLs by mouth 4 (four) times daily.    Marland Kitchen LORazepam (ATIVAN) 0.5 MG tablet Take 0.25-0.5 mg by mouth at bedtime.    Marland Kitchen  Misc Natural Products (OSTEO BI-FLEX JOINT SHIELD PO) Take 1 tablet by mouth daily.     . Multiple Vitamins-Minerals (MULTIVITAMIN WITH MINERALS) tablet Take 1 tablet by mouth daily.    . nabumetone (RELAFEN) 500 MG tablet Take 500 mg by mouth 2 (two) times daily.    . rosuvastatin (CRESTOR) 10 MG tablet Take 1 tablet (10 mg total) by mouth at bedtime. 30 tablet 11  . sertraline (ZOLOFT) 100 MG tablet Take 100 mg by mouth daily.     No current facility-administered medications for this visit.    REVIEW OF SYSTEMS:   [X]  denotes positive finding, [ ]  denotes negative finding Cardiac  Comments:  Chest pain or chest pressure:    Shortness of breath upon exertion:    Short of breath when lying flat:    Irregular heart rhythm:         Vascular    Pain in calf, thigh, or hip brought on by ambulation:    Pain in feet at night that wakes you up from your sleep:     Blood clot in your veins:    Leg swelling:         Pulmonary    Oxygen at home:    Productive cough:     Wheezing:         Neurologic    Sudden weakness in arms or legs:     Sudden numbness in arms or legs:     Sudden onset of difficulty speaking or slurred speech:    Temporary loss of vision in one eye:     Problems with dizziness:         Gastrointestinal    Blood in stool:     Vomited blood:         Genitourinary    Burning when urinating:     Blood in urine:        Psychiatric    Major depression:         Hematologic    Bleeding problems:    Problems with blood clotting too easily:        Skin    Rashes or ulcers: x       Constitutional    Fever or chills:      PHYSICAL EXAM:   Vitals:   06/11/19 1407  BP: 127/67  Pulse: 68  Resp: 20  Temp: 97.6 F (36.4 C)  SpO2: 100%  Weight: 110 lb (49.9 kg)  Height: 5\' 2"  (1.575 m)    GENERAL: The patient is a well-nourished female, in no acute distress. The vital signs are documented above. CARDIAC: There is a regular rate and rhythm.  VASCULAR: Nonpalpable pedal pulses PULMONARY: Non-labored respirations ABDOMEN: Soft and non-tender with normal pitched bowel sounds.  MUSCULOSKELETAL: There are no major deformities or cyanosis. NEUROLOGIC: No focal weakness or paresthesias are detected. SKIN: Wound has decreased in size without drainage or erythema PSYCHIATRIC: The patient has a normal affect.  STUDIES:   I have reviewed the following: ABI/TBIToday's ABIToday's TBIPrevious ABIPrevious TBI +-------+-----------+-----------+------------+------------+ Right  1.29       0.81                                +-------+-----------+-----------+------------+------------+ Left   1.10       0.89                                 +-------+-----------+-----------+------------+------------+  Right: Patent stent with no evidence of stenosis in the right superficial femoral artery origin to proximal popliteal artery artery.  Right groin lobulated avascular septate fluid collection 2.03 x 0.573 x 1.98 cm.  Left: Left distal CFA, Proximal SFA, and Proximal DFA are patent without evidence of stenosis.  MEDICAL ISSUES:   Status post right superficial femoral artery stenting for a right leg ulcer.  Her wound continues to improve.  Ultrasound today shows that her stents remain widely patent.  I will repeat her studies in 3 months  Left groin pain:  There is a small knot in the groin, which is likely the Mynx device.  This should improve with time.  If there is further concern about her anemia, she could come off of her Plavix    Annamarie Major, IV, MD, FACS [facility] Special[facility] [phone] Pager [phone]

## 2019-06-13 DIAGNOSIS — S81831D Puncture wound without foreign body, right lower leg, subsequent encounter: Secondary | ICD-10-CM | POA: Diagnosis not present

## 2019-06-13 DIAGNOSIS — S72002D Fracture of unspecified part of neck of left femur, subsequent encounter for closed fracture with routine healing: Secondary | ICD-10-CM | POA: Diagnosis not present

## 2019-06-13 DIAGNOSIS — M810 Age-related osteoporosis without current pathological fracture: Secondary | ICD-10-CM | POA: Diagnosis not present

## 2019-06-13 DIAGNOSIS — S42302D Unspecified fracture of shaft of humerus, left arm, subsequent encounter for fracture with routine healing: Secondary | ICD-10-CM | POA: Diagnosis not present

## 2019-06-13 DIAGNOSIS — F329 Major depressive disorder, single episode, unspecified: Secondary | ICD-10-CM | POA: Diagnosis not present

## 2019-06-13 DIAGNOSIS — I10 Essential (primary) hypertension: Secondary | ICD-10-CM | POA: Diagnosis not present

## 2019-06-22 DIAGNOSIS — M810 Age-related osteoporosis without current pathological fracture: Secondary | ICD-10-CM | POA: Diagnosis not present

## 2019-06-22 DIAGNOSIS — S42302D Unspecified fracture of shaft of humerus, left arm, subsequent encounter for fracture with routine healing: Secondary | ICD-10-CM | POA: Diagnosis not present

## 2019-06-22 DIAGNOSIS — F329 Major depressive disorder, single episode, unspecified: Secondary | ICD-10-CM | POA: Diagnosis not present

## 2019-06-22 DIAGNOSIS — S72002D Fracture of unspecified part of neck of left femur, subsequent encounter for closed fracture with routine healing: Secondary | ICD-10-CM | POA: Diagnosis not present

## 2019-06-22 DIAGNOSIS — I10 Essential (primary) hypertension: Secondary | ICD-10-CM | POA: Diagnosis not present

## 2019-06-22 DIAGNOSIS — S81831D Puncture wound without foreign body, right lower leg, subsequent encounter: Secondary | ICD-10-CM | POA: Diagnosis not present

## 2019-06-28 DIAGNOSIS — S81801D Unspecified open wound, right lower leg, subsequent encounter: Secondary | ICD-10-CM | POA: Diagnosis not present

## 2019-06-29 DIAGNOSIS — M810 Age-related osteoporosis without current pathological fracture: Secondary | ICD-10-CM | POA: Diagnosis not present

## 2019-06-29 DIAGNOSIS — S81831D Puncture wound without foreign body, right lower leg, subsequent encounter: Secondary | ICD-10-CM | POA: Diagnosis not present

## 2019-06-29 DIAGNOSIS — I10 Essential (primary) hypertension: Secondary | ICD-10-CM | POA: Diagnosis not present

## 2019-06-29 DIAGNOSIS — F329 Major depressive disorder, single episode, unspecified: Secondary | ICD-10-CM | POA: Diagnosis not present

## 2019-06-29 DIAGNOSIS — S42302D Unspecified fracture of shaft of humerus, left arm, subsequent encounter for fracture with routine healing: Secondary | ICD-10-CM | POA: Diagnosis not present

## 2019-06-29 DIAGNOSIS — S72002D Fracture of unspecified part of neck of left femur, subsequent encounter for closed fracture with routine healing: Secondary | ICD-10-CM | POA: Diagnosis not present

## 2019-07-02 ENCOUNTER — Other Ambulatory Visit: Payer: Self-pay | Admitting: *Deleted

## 2019-07-02 DIAGNOSIS — I70233 Atherosclerosis of native arteries of right leg with ulceration of ankle: Secondary | ICD-10-CM

## 2019-07-06 DIAGNOSIS — S42302D Unspecified fracture of shaft of humerus, left arm, subsequent encounter for fracture with routine healing: Secondary | ICD-10-CM | POA: Diagnosis not present

## 2019-07-06 DIAGNOSIS — M810 Age-related osteoporosis without current pathological fracture: Secondary | ICD-10-CM | POA: Diagnosis not present

## 2019-07-06 DIAGNOSIS — I10 Essential (primary) hypertension: Secondary | ICD-10-CM | POA: Diagnosis not present

## 2019-07-06 DIAGNOSIS — S81831D Puncture wound without foreign body, right lower leg, subsequent encounter: Secondary | ICD-10-CM | POA: Diagnosis not present

## 2019-07-06 DIAGNOSIS — F329 Major depressive disorder, single episode, unspecified: Secondary | ICD-10-CM | POA: Diagnosis not present

## 2019-07-06 DIAGNOSIS — S72002D Fracture of unspecified part of neck of left femur, subsequent encounter for closed fracture with routine healing: Secondary | ICD-10-CM | POA: Diagnosis not present

## 2019-07-08 DIAGNOSIS — I10 Essential (primary) hypertension: Secondary | ICD-10-CM | POA: Diagnosis not present

## 2019-07-08 DIAGNOSIS — S81831D Puncture wound without foreign body, right lower leg, subsequent encounter: Secondary | ICD-10-CM | POA: Diagnosis not present

## 2019-07-08 DIAGNOSIS — Z79891 Long term (current) use of opiate analgesic: Secondary | ICD-10-CM | POA: Diagnosis not present

## 2019-07-08 DIAGNOSIS — S42302D Unspecified fracture of shaft of humerus, left arm, subsequent encounter for fracture with routine healing: Secondary | ICD-10-CM | POA: Diagnosis not present

## 2019-07-08 DIAGNOSIS — S72002D Fracture of unspecified part of neck of left femur, subsequent encounter for closed fracture with routine healing: Secondary | ICD-10-CM | POA: Diagnosis not present

## 2019-07-08 DIAGNOSIS — Z7982 Long term (current) use of aspirin: Secondary | ICD-10-CM | POA: Diagnosis not present

## 2019-07-08 DIAGNOSIS — M810 Age-related osteoporosis without current pathological fracture: Secondary | ICD-10-CM | POA: Diagnosis not present

## 2019-07-08 DIAGNOSIS — Z9181 History of falling: Secondary | ICD-10-CM | POA: Diagnosis not present

## 2019-07-08 DIAGNOSIS — F329 Major depressive disorder, single episode, unspecified: Secondary | ICD-10-CM | POA: Diagnosis not present

## 2019-07-11 NOTE — Progress Notes (Signed)
[facility]                                       Cardiology Office Note    Date:  07/12/2019   ID:  Simpson, Michaela March 13, 1932, MRN [id number]  PCP:  Celene Squibb, MD  Cardiologist:  Dr. Burt Knack   CC: follow up aortic stenosis.   History of Present Il lness:  Michaela Simpson is a 86 y.o. female with a history of HTN, rheumatoid arthritis, anemia, PAD with nonhealing ulcer s/p right superficial femoral-popliteal artery stenting (04/2019) and severe AS who presents for follow up of aortic stenosis.   She was hospitalized in 02/2019 for a mechanical fall resulting in a left hip fracture and left humerus fracture.  She underwent ORIF of the hip fracture and nonoperative treatment of the humerus fracture was recommended.  During her hospitalization she was noted to have a heart murmur and an echocardiogram was ordered.  This demonstrated severe aortic stenosis and she is referred for outpatient consultation to review treatment options.    She was seen by Dr. Burt Knack in the office on 04/12/2019 and felt to be a good candidate for TAVR but she requested some more time to recover from her recent hip/humerus fracture. She was seen by Dr. Trula Slade for a non healing ulcer of her right leg. She underwent mechanical thrombectomy and stenting of the right superficial femoral-popliteal artery on 05/08/19. Placed on DAPT with aspirin and plavix. Leg ulcer is healing now.  Today she presents to clinic for follow up. Here with her daughter. She is doing well. Has pain in her left groin since her aortogram with Dr. Trula Slade. Hip still hurts and walks with a walker. No CP or SOB. No LE edema, orthopnea or PND. No dizziness or syncope. No blood in stool or urine. No palpitations. She does admit to slowing down considerably which she attributed to old age. Wants to get outside and garden. Leg ulcer is healing now.   Past Medical History:  Diagnosis  Date  . AKI (acute kidney injury) ([place])   . Anemia   . Aortic stenosis   . Depression   . Hip fracture ([place])   . HTN (hypertension)   . Humerus fracture   . Leukocytosis   . Osteoporosis   . Rheumatoid arthritis ([place])   . Ulcer of lower extremity [facility])     Past Surgical History:  Procedure Laterality Date  . ABDOMINAL AORTOGRAM W/LOWER EXTREMITY N/A 05/08/2019   Procedure: ABDOMINAL AORTOGRAM W/LOWER EXTREMITY;  Surgeon: Serafina Mitchell, MD;  Location: [facility];  Service: Cardiovascular;  Laterality: N/A;  . HIP PINNING,CANNULATED Left 03/09/2019   Procedure: CANNULATED HIP PINNING;  Surgeon: Mcarthur Rossetti, MD;  Location: [facility];  Service: Orthopedics;  Laterality: Left;  . INTRAMEDULLARY (IM) NAIL INTERTROCHANTERIC Right 03/14/2015   Procedure: RIGHT INTERTROCHANTRIC INTRAMEDULLARY (IM) NAIL ;  Surgeon: Mcarthur Rossetti, MD;  Location: [facility];  Service: Orthopedics;  Laterality: Right;  . PERIPHERAL VASCULAR INTERVENTION Right 05/08/2019   Procedure: PERIPHERAL VASCULAR INTERVENTION;  Surgeon: Serafina Mitchell, MD;  Location: [facility];  Service: Cardiovascular;  Laterality: Right;    Current Medications: Outpatient Medications Prior to Visit  Medication Sig Dispense Refill  . acetaminophen (TYLENOL) 325 MG tablet Take 2 tablets (650 mg total) by mouth every 6 (  six) hours as needed for mild pain (or Fever >/= 101).    Marland Kitchen amLODipine (NORVASC) 2.5 MG tablet Take 1 tablet (2.5 mg total) by mouth at bedtime. 30 tablet 0  . aspirin EC 81 MG tablet Take 81 mg by mouth daily.    . [place]-Glucosamine-Vit D (OSTEO BI-FLEX ONE PER DAY PO) Take 1 tablet by mouth daily.    . clopidogrel (PLAVIX) 75 MG tablet Take 1 tablet (75 mg total) by mouth daily. 30 tablet 11  . Cyanocobalamin (VITAMIN B-12 PO) Take 1 tablet by mouth.    . Lactobacillus (PROBIOTIC ACIDOPHILUS PO) Take 1 tablet by mouth daily.    Marland Kitchen lactose free nutrition (BOOST) LIQD Take 237 mLs by  mouth 4 (four) times daily.    Marland Kitchen LORazepam (ATIVAN) 0.5 MG tablet Take 0.25-0.5 mg by mouth at bedtime.    . Misc Natural Products (OSTEO BI-FLEX JOINT SHIELD PO) Take 1 tablet by mouth daily.     . Multiple Vitamins-Minerals (MULTIVITAMIN WITH MINERALS) tablet Take 1 tablet by mouth daily.    . Polyethylene Glycol 3350 (MIRALAX PO) Take 1 Scoop by mouth as needed.    . Pseudoephedrine-Acetaminophen (SM NON-ASPRIN SINUS PO) Take 81 mg by mouth.    . rosuvastatin (CRESTOR) 10 MG tablet Take 1 tablet (10 mg total) by mouth at bedtime. 30 tablet 11  . sertraline (ZOLOFT) 100 MG tablet Take 100 mg by mouth daily.    . nabumetone (RELAFEN) 500 MG tablet Take 500 mg by mouth 2 (two) times daily.     No facility-administered medications prior to visit.     Allergies:   Bee pollen, Penicillins, and Codeine   Social History   Socioeconomic History  . Marital status: Widowed    Spouse name: Not on file  . Number of children: Not on file  . Years of education: Not on file  . Highest education level: Not on file  Occupational History  . Not on file  Tobacco Use  . Smoking status: Never Smoker  . Smokeless tobacco: Never Used  Substance and Sexual Activity  . Alcohol use: No  . Drug use: No  . Sexual activity: Not on file  Other Topics Concern  . Not on file  Social History Narrative  . Not on file   Social Determinants of Health   Financial Resource Strain: Low Risk   . Difficulty of Paying Living Expenses: Not hard at all  Food Insecurity: No Food Insecurity  . Worried About Charity fundraiser in the Last Year: Never true  . Ran Out of Food in the Last Year: Never true  Transportation Needs: No Transportation Needs  . Lack of Transportation (Medical): No  . Lack of Transportation (Non-Medical): No  Physical Activity: Unknown  . Days of Exercise per Week: Patient refused  . Minutes of Exercise per Session: Patient refused  Stress: No Stress Concern Present  . Feeling of Stress  : Not at all  Social Connections: Unknown  . Frequency of Communication with Friends and Family: Patient refused  . Frequency of Social Gatherings with Friends and Family: Patient refused  . Attends Religious Services: Patient refused  . Active Member of Clubs or Organizations: Patient refused  . Attends Archivist Meetings: Patient refused  . Marital Status: Patient refused     Family History:  The patient's family history includes Cancer in her sister.     ROS:   Please see the history of present illness.  ROS All other systems reviewed and are negative.   PHYSICAL EXAM:   VS:  BP 132/64   Pulse 85   Ht 5\' 2"  (1.575 m)   Wt 103 lb 12 oz (47.1 kg)   SpO2 98%   BMI 18.98 kg/m    GEN: Well nourished, well developed, in no acute distress HEENT: normal Neck: no JVD or masses Cardiac: RRR; 3/6 SEM @ RUSB. No rubs, or gallops,no edema  Respiratory:  clear to auscultation bilaterally, normal work of breathing GI: soft, nontender, nondistended, + BS MS: no deformity or atrophy Skin: warm and dry, no rash. Right shin with bandage. Left groin exam normal.  Neuro:  Alert and Oriented x 3, Strength and sensation are intact Psych: euthymic mood, full affect   Wt Readings from Last 3 Encounters:  07/12/19 103 lb 12 oz (47.1 kg)  06/11/19 110 lb (49.9 kg)  05/08/19 110 lb (49.9 kg)      Studies/Labs Reviewed:   EKG:  EKG is ordered today.   Recent Labs: 03/12/2019: Platelets 202 05/08/2019: BUN 36; Creatinine, Ser 0.80; Hemoglobin 15.3; Potassium 4.2; Sodium 140   Lipid Panel No results found for: CHOL, TRIG, HDL, CHOLHDL, VLDL, LDLCALC, LDLDIRECT  Additional studies/ records that were reviewed today include:   Echo 03/09/19 IMPRESSIONS  1. Left ventricular ejection fraction, by visual estimation, is 65 to 70%. The left ventricle has normal function. Normal left ventricular size. There is no left ventricular hypertrophy.  2. Global right ventricle has normal  systolic function.The right ventricular size is normal. No increase in right ventricular wall thickness.  3. Left atrial size was normal.  4. Right atrial size was normal.  5. The mitral valve is normal in structure. No evidence of mitral valve regurgitation. No evidence of mitral stenosis.  6. The tricuspid valve is normal in structure. Tricuspid valve regurgitation was not visualized by color flow Doppler.  7. Aortic valve mean gradient measures 54.0 mmHg.  8. Aortic valve peak gradient measures 84.6 mmHg.  9. The aortic valve is tricuspid with severely calcified leaflets. Aortic valve regurgitation was not visualized by color flow Doppler. Severe aortic valve stenosis. 10. The pulmonic valve was normal in structure. Pulmonic valve regurgitation is not visualized by color flow Doppler. 11. The inferior vena cava is normal in size with greater than 50% respiratory variability, suggesting right atrial pressure of 3 mmHg. 12. Left ventricular diastolic Doppler parameters are consistent with impaired relaxation pattern of LV diastolic filling. 13. Elevated left ventricular end-diastolic pressure. 14. Moderate mitral annular calcification.  ______________   STS RISK CALCULATOR: Isolated AVR: Risk of Mortality: 4.823% Renal Failure: 2.988% Permanent Stroke: 2.674% Prolonged Ventilation: 10.353% DSW Infection: 0.042% Reoperation: 5.231% Morbidity or Mortality: 17.394% Short Length of Stay: 20.361% Long Length of Stay: 9.019%  ASSESSMENT & PLAN:   Severe AS: interested in starting TAVR work up. She has NYHA class II symptoms.   I have reviewed the natural history of aortic stenosis with the patient and their family members who are present today. We have discussed the limitations of medical therapy and the poor prognosis associated with symptomatic aortic stenosis. We have reviewed potential treatment options, including palliative medical therapy, conventional surgical aortic valve replacement,  and transcatheter aortic valve replacement. We discussed treatment options in the context of the patient's specific comorbid medical conditions. Plan to start with [facility] next week with Dr. Burt Knack.  I have reviewed the risks, indications, and alternatives to cardiac catheterization and possible angioplasty/stenting with the patient. Risks  include but are not limited to bleeding, infection, vascular injury, stroke, myocardial infection, arrhythmia, kidney injury, radiation-related injury in the case of prolonged fluoroscopy use, emergency cardiac surgery, and death. The patient understands the risks of serious complication is low (123456).   Of note, pt complains of persistent pain around her left groin site from previous aortogram last November. Physical exam of the area is completely normal. If we cannot use radial acces for cath, would try to use right groin.   PAD s/p right superficial femoral artery stenting: continue on DAPT  HTN: BP well controlled today.   Medication Adjustments/Labs and Tests Ordered: Current medicines are reviewed at length with the patient today.  Concerns regarding medicines are outlined above.  Medication changes, Labs and Tests ordered today are listed in the Patient Instructions below. Patient Instructions  COVID SCREENING INFORMATION: You are scheduled for your COVID screening on: Monday, July 16, 2019 at [place]: You are scheduled for a Cardiac Catheterization with Dr. Sherren Mocha on: Thursday, July 19, 2019.  1. Please arrive at the [facility] (Main Entrance A) at [facility]: [address] [place], [place] [address] at: 9:30AM. (This time is two hours before your procedure to ensure your preparation). You are allowed ONE visitor in the waiting room. Both you and your visitor must wear masks. Free valet parking service is available.  Special note: Every effort is made to have your procedure done  on time. Please understand that emergencies sometimes delay scheduled procedures.  2. Diet: Do not eat solid foods after midnight.  You may have clear liquids until 5am upon the day of the procedure.  3. Labs: TODAY.  4. Medication instructions in preparation for your procedure:  1) Make sure to take your PLAVIX the morning of your procedure  2) You may take your other medications as directed with sips of water  5. Plan for one night stay--bring personal belongings. 6. Bring a current list of your medications and current insurance cards. 7. You MUST have a responsible person to drive you home. 8. Someone MUST be with you the first 24 hours after you arrive home or your discharge will be delayed. 9. Please wear clothes that are easy to get on and off and wear slip-on shoes.  Thank you for allowing [place] to care for you!   -- [facility] Invasive Cardiovascular services     Signed, Angelena Form, PA-C  07/12/2019 2:54 PM    [address], [place], [place]  [address] Phone: [phone]; Fax: [phone]

## 2019-07-11 NOTE — H&P (View-Only) (Signed)
[place]                                       Cardiology Office Note    Date:  07/12/2019   ID:  Brigit, Pee 1932-02-16, MRN [id number]  PCP:  Celene Squibb, MD  Cardiologist:  Dr. Burt Knack   CC: follow up aortic stenosis.   History of Present Il lness:  Michaela Simpson is a 86 y.o. female with a history of HTN, rheumatoid arthritis, anemia, PAD with nonhealing ulcer s/p right superficial femoral-popliteal artery stenting (04/2019) and severe AS who presents for follow up of aortic stenosis.   She was hospitalized in 02/2019 for a mechanical fall resulting in a left hip fracture and left humerus fracture.  She underwent ORIF of the hip fracture and nonoperative treatment of the humerus fracture was recommended.  During her hospitalization she was noted to have a heart murmur and an echocardiogram was ordered.  This demonstrated severe aortic stenosis and she is referred for outpatient consultation to review treatment options.    She was seen by Dr. Burt Knack in the office on 04/12/2019 and felt to be a good candidate for TAVR but she requested some more time to recover from her recent hip/humerus fracture. She was seen by Dr. Trula Slade for a non healing ulcer of her right leg. She underwent mechanical thrombectomy and stenting of the right superficial femoral-popliteal artery on 05/08/19. Placed on DAPT with aspirin and plavix. Leg ulcer is healing now.  Today she presents to clinic for follow up. Here with her daughter. She is doing well. Has pain in her left groin since her aortogram with Dr. Trula Slade. Hip still hurts and walks with a walker. No CP or SOB. No LE edema, orthopnea or PND. No dizziness or syncope. No blood in stool or urine. No palpitations. She does admit to slowing down considerably which she attributed to old age. Wants to get outside and garden. Leg ulcer is healing now.   Past Medical History:  Diagnosis  Date  . AKI (acute kidney injury) ([place])   . Anemia   . Aortic stenosis   . Depression   . Hip fracture ([place])   . HTN (hypertension)   . Humerus fracture   . Leukocytosis   . Osteoporosis   . Rheumatoid arthritis ([place])   . Ulcer of lower extremity [facility])     Past Surgical History:  Procedure Laterality Date  . ABDOMINAL AORTOGRAM W/LOWER EXTREMITY N/A 05/08/2019   Procedure: ABDOMINAL AORTOGRAM W/LOWER EXTREMITY;  Surgeon: Serafina Mitchell, MD;  Location: [place] [facility];  Service: Cardiovascular;  Laterality: N/A;  . HIP PINNING,CANNULATED Left 03/09/2019   Procedure: CANNULATED HIP PINNING;  Surgeon: Mcarthur Rossetti, MD;  Location: [place];  Service: Orthopedics;  Laterality: Left;  . INTRAMEDULLARY (IM) NAIL INTERTROCHANTERIC Right 03/14/2015   Procedure: RIGHT INTERTROCHANTRIC INTRAMEDULLARY (IM) NAIL ;  Surgeon: Mcarthur Rossetti, MD;  Location: [place];  Service: Orthopedics;  Laterality: Right;  . PERIPHERAL VASCULAR INTERVENTION Right 05/08/2019   Procedure: PERIPHERAL VASCULAR INTERVENTION;  Surgeon: Serafina Mitchell, MD;  Location: [place] [facility];  Service: Cardiovascular;  Laterality: Right;    Current Medications: Outpatient Medications Prior to Visit  Medication Sig Dispense Refill  . acetaminophen (TYLENOL) 325 MG tablet Take 2 tablets (650 mg total) by mouth every 6 (  six) hours as needed for mild pain (or Fever >/= 101).    Marland Kitchen amLODipine (NORVASC) 2.5 MG tablet Take 1 tablet (2.5 mg total) by mouth at bedtime. 30 tablet 0  . aspirin EC 81 MG tablet Take 81 mg by mouth daily.    . [place]-Glucosamine-Vit D (OSTEO BI-FLEX ONE PER DAY PO) Take 1 tablet by mouth daily.    . clopidogrel (PLAVIX) 75 MG tablet Take 1 tablet (75 mg total) by mouth daily. 30 tablet 11  . Cyanocobalamin (VITAMIN B-12 PO) Take 1 tablet by mouth.    . Lactobacillus (PROBIOTIC ACIDOPHILUS PO) Take 1 tablet by mouth daily.    Marland Kitchen lactose free nutrition (BOOST) LIQD Take 237 mLs by  mouth 4 (four) times daily.    Marland Kitchen LORazepam (ATIVAN) 0.5 MG tablet Take 0.25-0.5 mg by mouth at bedtime.    . Misc Natural Products (OSTEO BI-FLEX JOINT SHIELD PO) Take 1 tablet by mouth daily.     . Multiple Vitamins-Minerals (MULTIVITAMIN WITH MINERALS) tablet Take 1 tablet by mouth daily.    . Polyethylene Glycol 3350 (MIRALAX PO) Take 1 Scoop by mouth as needed.    . Pseudoephedrine-Acetaminophen (SM NON-ASPRIN SINUS PO) Take 81 mg by mouth.    . rosuvastatin (CRESTOR) 10 MG tablet Take 1 tablet (10 mg total) by mouth at bedtime. 30 tablet 11  . sertraline (ZOLOFT) 100 MG tablet Take 100 mg by mouth daily.    . nabumetone (RELAFEN) 500 MG tablet Take 500 mg by mouth 2 (two) times daily.     No facility-administered medications prior to visit.     Allergies:   Bee pollen, Penicillins, and Codeine   Social History   Socioeconomic History  . Marital status: Widowed    Spouse name: Not on file  . Number of children: Not on file  . Years of education: Not on file  . Highest education level: Not on file  Occupational History  . Not on file  Tobacco Use  . Smoking status: Never Smoker  . Smokeless tobacco: Never Used  Substance and Sexual Activity  . Alcohol use: No  . Drug use: No  . Sexual activity: Not on file  Other Topics Concern  . Not on file  Social History Narrative  . Not on file   Social Determinants of Health   Financial Resource Strain: Low Risk   . Difficulty of Paying Living Expenses: Not hard at all  Food Insecurity: No Food Insecurity  . Worried About Charity fundraiser in the Last Year: Never true  . Ran Out of Food in the Last Year: Never true  Transportation Needs: No Transportation Needs  . Lack of Transportation (Medical): No  . Lack of Transportation (Non-Medical): No  Physical Activity: Unknown  . Days of Exercise per Week: Patient refused  . Minutes of Exercise per Session: Patient refused  Stress: No Stress Concern Present  . Feeling of Stress  : Not at all  Social Connections: Unknown  . Frequency of Communication with Friends and Family: Patient refused  . Frequency of Social Gatherings with Friends and Family: Patient refused  . Attends Religious Services: Patient refused  . Active Member of Clubs or Organizations: Patient refused  . Attends Archivist Meetings: Patient refused  . Marital Status: Patient refused     Family History:  The patient's family history includes Cancer in her sister.     ROS:   Please see the history of present illness.  ROS All other systems reviewed and are negative.   PHYSICAL EXAM:   VS:  BP 132/64   Pulse 85   Ht 5\' 2"  (1.575 m)   Wt 103 lb 12 oz (47.1 kg)   SpO2 98%   BMI 18.98 kg/m    GEN: Well nourished, well developed, in no acute distress HEENT: normal Neck: no JVD or masses Cardiac: RRR; 3/6 SEM @ RUSB. No rubs, or gallops,no edema  Respiratory:  clear to auscultation bilaterally, normal work of breathing GI: soft, nontender, nondistended, + BS MS: no deformity or atrophy Skin: warm and dry, no rash. Right shin with bandage. Left groin exam normal.  Neuro:  Alert and Oriented x 3, Strength and sensation are intact Psych: euthymic mood, full affect   Wt Readings from Last 3 Encounters:  07/12/19 103 lb 12 oz (47.1 kg)  06/11/19 110 lb (49.9 kg)  05/08/19 110 lb (49.9 kg)      Studies/Labs Reviewed:   EKG:  EKG is ordered today.   Recent Labs: 03/12/2019: Platelets 202 05/08/2019: BUN 36; Creatinine, Ser 0.80; Hemoglobin 15.3; Potassium 4.2; Sodium 140   Lipid Panel No results found for: CHOL, TRIG, HDL, CHOLHDL, VLDL, LDLCALC, LDLDIRECT  Additional studies/ records that were reviewed today include:   Echo 03/09/19 IMPRESSIONS  1. Left ventricular ejection fraction, by visual estimation, is 65 to 70%. The left ventricle has normal function. Normal left ventricular size. There is no left ventricular hypertrophy.  2. Global right ventricle has normal  systolic function.The right ventricular size is normal. No increase in right ventricular wall thickness.  3. Left atrial size was normal.  4. Right atrial size was normal.  5. The mitral valve is normal in structure. No evidence of mitral valve regurgitation. No evidence of mitral stenosis.  6. The tricuspid valve is normal in structure. Tricuspid valve regurgitation was not visualized by color flow Doppler.  7. Aortic valve mean gradient measures 54.0 mmHg.  8. Aortic valve peak gradient measures 84.6 mmHg.  9. The aortic valve is tricuspid with severely calcified leaflets. Aortic valve regurgitation was not visualized by color flow Doppler. Severe aortic valve stenosis. 10. The pulmonic valve was normal in structure. Pulmonic valve regurgitation is not visualized by color flow Doppler. 11. The inferior vena cava is normal in size with greater than 50% respiratory variability, suggesting right atrial pressure of 3 mmHg. 12. Left ventricular diastolic Doppler parameters are consistent with impaired relaxation pattern of LV diastolic filling. 13. Elevated left ventricular end-diastolic pressure. 14. Moderate mitral annular calcification.  ______________   STS RISK CALCULATOR: Isolated AVR: Risk of Mortality: 4.823% Renal Failure: 2.988% Permanent Stroke: 2.674% Prolonged Ventilation: 10.353% DSW Infection: 0.042% Reoperation: 5.231% Morbidity or Mortality: 17.394% Short Length of Stay: 20.361% Long Length of Stay: 9.019%  ASSESSMENT & PLAN:   Severe AS: interested in starting TAVR work up. She has NYHA class II symptoms.   I have reviewed the natural history of aortic stenosis with the patient and their family members who are present today. We have discussed the limitations of medical therapy and the poor prognosis associated with symptomatic aortic stenosis. We have reviewed potential treatment options, including palliative medical therapy, conventional surgical aortic valve replacement,  and transcatheter aortic valve replacement. We discussed treatment options in the context of the patient's specific comorbid medical conditions. Plan to start with [facility] next week with Dr. Burt Knack.  I have reviewed the risks, indications, and alternatives to cardiac catheterization and possible angioplasty/stenting with the patient. Risks  include but are not limited to bleeding, infection, vascular injury, stroke, myocardial infection, arrhythmia, kidney injury, radiation-related injury in the case of prolonged fluoroscopy use, emergency cardiac surgery, and death. The patient understands the risks of serious complication is low (123456).   Of note, pt complains of persistent pain around her left groin site from previous aortogram last November. Physical exam of the area is completely normal. If we cannot use radial acces for cath, would try to use right groin.   PAD s/p right superficial femoral artery stenting: continue on DAPT  HTN: BP well controlled today.   Medication Adjustments/Labs and Tests Ordered: Current medicines are reviewed at length with the patient today.  Concerns regarding medicines are outlined above.  Medication changes, Labs and Tests ordered today are listed in the Patient Instructions below. Patient Instructions  COVID SCREENING INFORMATION: You are scheduled for your COVID screening on: Monday, July 16, 2019 at [place]: You are scheduled for a Cardiac Catheterization with Dr. Sherren Mocha on: Thursday, July 19, 2019.  1. Please arrive at the [facility] (Main Entrance A) at [facility]: [address] [place], [place] [address] at: 9:30AM. (This time is two hours before your procedure to ensure your preparation). You are allowed ONE visitor in the waiting room. Both you and your visitor must wear masks. Free valet parking service is available.  Special note: Every effort is made to have your procedure done  on time. Please understand that emergencies sometimes delay scheduled procedures.  2. Diet: Do not eat solid foods after midnight.  You may have clear liquids until 5am upon the day of the procedure.  3. Labs: TODAY.  4. Medication instructions in preparation for your procedure:  1) Make sure to take your PLAVIX the morning of your procedure  2) You may take your other medications as directed with sips of water  5. Plan for one night stay--bring personal belongings. 6. Bring a current list of your medications and current insurance cards. 7. You MUST have a responsible person to drive you home. 8. Someone MUST be with you the first 24 hours after you arrive home or your discharge will be delayed. 9. Please wear clothes that are easy to get on and off and wear slip-on shoes.  Thank you for allowing [place] to care for you!   -- [facility] Invasive Cardiovascular services     Signed, Michaela Form, PA-C  07/12/2019 2:54 PM    [address], [place], [place]  [address] Phone: [phone]; Fax: [phone]

## 2019-07-12 ENCOUNTER — Encounter: Payer: Self-pay | Admitting: Physician Assistant

## 2019-07-12 ENCOUNTER — Other Ambulatory Visit: Payer: Self-pay

## 2019-07-12 ENCOUNTER — Ambulatory Visit (INDEPENDENT_AMBULATORY_CARE_PROVIDER_SITE_OTHER): Payer: Medicare Other | Admitting: Physician Assistant

## 2019-07-12 VITALS — BP 132/64 | HR 85 | Ht 62.0 in | Wt 103.8 lb

## 2019-07-12 DIAGNOSIS — I10 Essential (primary) hypertension: Secondary | ICD-10-CM | POA: Diagnosis not present

## 2019-07-12 DIAGNOSIS — I70233 Atherosclerosis of native arteries of right leg with ulceration of ankle: Secondary | ICD-10-CM

## 2019-07-12 DIAGNOSIS — I350 Nonrheumatic aortic (valve) stenosis: Secondary | ICD-10-CM

## 2019-07-12 DIAGNOSIS — I739 Peripheral vascular disease, unspecified: Secondary | ICD-10-CM

## 2019-07-12 NOTE — Patient Instructions (Addendum)
COVID SCREENING INFORMATION: You are scheduled for your COVID screening on: Monday, July 16, 2019 at [place]: You are scheduled for a Cardiac Catheterization with Dr. Sherren Mocha on: Thursday, July 19, 2019.  1. Please arrive at the [facility] (Main Entrance A) at [facility]: [address] [place], [place] [address] at: 9:30AM. (This time is two hours before your procedure to ensure your preparation). You are allowed ONE visitor in the waiting room. Both you and your visitor must wear masks. Free valet parking service is available.  Special note: Every effort is made to have your procedure done on time. Please understand that emergencies sometimes delay scheduled procedures.  2. Diet: Do not eat solid foods after midnight.  You may have clear liquids until 5am upon the day of the procedure.  3. Labs: TODAY.  4. Medication instructions in preparation for your procedure:  1) Make sure to take your PLAVIX the morning of your procedure  2) You may take your other medications as directed with sips of water  5. Plan for one night stay--bring personal belongings. 6. Bring a current list of your medications and current insurance cards. 7. You MUST have a responsible person to drive you home. 8. Someone MUST be with you the first 24 hours after you arrive home or your discharge will be delayed. 9. Please wear clothes that are easy to get on and off and wear slip-on shoes.  Thank you for allowing [place] to care for you!   -- [place] Invasive Cardiovascular services

## 2019-07-13 DIAGNOSIS — M810 Age-related osteoporosis without current pathological fracture: Secondary | ICD-10-CM | POA: Diagnosis not present

## 2019-07-13 DIAGNOSIS — S81831D Puncture wound without foreign body, right lower leg, subsequent encounter: Secondary | ICD-10-CM | POA: Diagnosis not present

## 2019-07-13 DIAGNOSIS — S72002D Fracture of unspecified part of neck of left femur, subsequent encounter for closed fracture with routine healing: Secondary | ICD-10-CM | POA: Diagnosis not present

## 2019-07-13 DIAGNOSIS — S42302D Unspecified fracture of shaft of humerus, left arm, subsequent encounter for fracture with routine healing: Secondary | ICD-10-CM | POA: Diagnosis not present

## 2019-07-13 DIAGNOSIS — F329 Major depressive disorder, single episode, unspecified: Secondary | ICD-10-CM | POA: Diagnosis not present

## 2019-07-13 DIAGNOSIS — I10 Essential (primary) hypertension: Secondary | ICD-10-CM | POA: Diagnosis not present

## 2019-07-13 LAB — CBC WITH DIFFERENTIAL/PLATELET
Basophils Absolute: 0 10*3/uL (ref 0.0–0.2)
Basos: 1 %
EOS (ABSOLUTE): 0 10*3/uL (ref 0.0–0.4)
Eos: 0 %
Hematocrit: 39.1 % (ref 34.0–46.6)
Hemoglobin: 13.2 g/dL (ref 11.1–15.9)
Immature Grans (Abs): 0 10*3/uL (ref 0.0–0.1)
Immature Granulocytes: 0 %
Lymphocytes Absolute: 1.4 10*3/uL (ref 0.7–3.1)
Lymphs: 16 %
MCH: 29.9 pg (ref 26.6–33.0)
MCHC: 33.8 g/dL (ref 31.5–35.7)
MCV: 89 fL (ref 79–97)
Monocytes Absolute: 0.6 10*3/uL (ref 0.1–0.9)
Monocytes: 7 %
Neutrophils Absolute: 6.7 10*3/uL (ref 1.4–7.0)
Neutrophils: 76 %
Platelets: 288 10*3/uL (ref 150–450)
RBC: 4.42 x10E6/uL (ref 3.77–5.28)
RDW: 13.6 % (ref 11.7–15.4)
WBC: 8.8 10*3/uL (ref 3.4–10.8)

## 2019-07-13 LAB — BASIC METABOLIC PANEL
BUN/Creatinine Ratio: 43 — ABNORMAL HIGH (ref 12–28)
BUN: 40 mg/dL — ABNORMAL HIGH (ref 8–27)
CO2: 27 mmol/L (ref 20–29)
Calcium: 9.9 mg/dL (ref 8.7–10.3)
Chloride: 103 mmol/L (ref 96–106)
Creatinine, Ser: 0.94 mg/dL (ref 0.57–1.00)
GFR calc Af Amer: 64 mL/min/{1.73_m2} (ref >59)
GFR calc non Af Amer: 55 mL/min/{1.73_m2} — ABNORMAL LOW (ref >59)
Glucose: 97 mg/dL (ref 65–99)
Potassium: 4.8 mmol/L (ref 3.5–5.2)
Sodium: 143 mmol/L (ref 134–144)

## 2019-07-16 ENCOUNTER — Other Ambulatory Visit (HOSPITAL_COMMUNITY): Payer: Private Health Insurance - Indemnity

## 2019-07-16 ENCOUNTER — Other Ambulatory Visit: Payer: Self-pay

## 2019-07-16 ENCOUNTER — Other Ambulatory Visit (HOSPITAL_COMMUNITY)
Admission: RE | Admit: 2019-07-16 | Discharge: 2019-07-16 | Disposition: A | Discharge status: Home / Self Care | Payer: Medicare Other | Source: Ambulatory Visit | Attending: Cardiovascular Disease | Admitting: Cardiovascular Disease

## 2019-07-16 DIAGNOSIS — Z01812 Encounter for preprocedural laboratory examination: Secondary | ICD-10-CM | POA: Diagnosis not present

## 2019-07-16 DIAGNOSIS — Z20822 Contact with and (suspected) exposure to covid-19: Secondary | ICD-10-CM | POA: Insufficient documentation

## 2019-07-16 LAB — SARS CORONAVIRUS 2 (TAT 6-24 HRS): SARS Coronavirus 2: NEGATIVE

## 2019-07-18 ENCOUNTER — Telehealth: Payer: Self-pay | Admitting: *Deleted

## 2019-07-18 NOTE — Telephone Encounter (Addendum)
Pt contacted pre-catheterization scheduled at [facility] for: Thursday July 19, 2019 11:30 AM Verified arrival time and place: [facility]) at: 9:30 AM   No solid food after midnight prior to cath, clear liquids until 5 AM day of procedure. Contrast allergy: no  AM meds can be  taken pre-cath with sip of water including: ASA 81 mg Plavix 75 mg  Confirmed patient has responsible adult to drive home post procedure and observe 24 hours after arriving home: yes  Currently, due to Covid-19 pandemic, only one person will be allowed with patient. Must be the same person for patient's entire stay and will be required to wear a mask. They will be asked to wait in the waiting room for the duration of the patient's stay.  Patients are required to wear a mask when they enter the hospital.      COVID-19 Pre-Screening Questions:  . In the past 7 to 10 days have you had a cough,  shortness of breath, headache, congestion, fever (100 or greater) body aches, chills, sore throat, or sudden loss of taste or sense of smell? no    I reviewed procedure/mask/visitor instructions with patient, she verbalized understanding, thanked me for call.  I   Pt asked me to call and review procedure instructions with her daughter, Molli Posey. I reviewed procedure instructions with Molli Posey, she verbalized understanding. Dawnn advised if inclement weather tomorrow morning, and she feels safe getting pt to the hospital, procedure would not be cancelled due to weather.

## 2019-07-19 ENCOUNTER — Encounter (HOSPITAL_COMMUNITY)
Admission: RE | Disposition: A | Discharge status: Home / Self Care | Payer: Private Health Insurance - Indemnity | Source: Home / Self Care | Attending: Cardiovascular Disease

## 2019-07-19 ENCOUNTER — Ambulatory Visit (HOSPITAL_COMMUNITY)
Admission: RE | Admit: 2019-07-19 | Discharge: 2019-07-19 | Disposition: A | Discharge status: Home / Self Care | Payer: Medicare Other | Attending: Cardiovascular Disease | Admitting: Cardiovascular Disease

## 2019-07-19 ENCOUNTER — Other Ambulatory Visit: Payer: Self-pay

## 2019-07-19 DIAGNOSIS — I10 Essential (primary) hypertension: Secondary | ICD-10-CM | POA: Diagnosis not present

## 2019-07-19 DIAGNOSIS — Z7902 Long term (current) use of antithrombotics/antiplatelets: Secondary | ICD-10-CM | POA: Diagnosis not present

## 2019-07-19 DIAGNOSIS — Z885 Allergy status to narcotic agent status: Secondary | ICD-10-CM | POA: Insufficient documentation

## 2019-07-19 DIAGNOSIS — Z7982 Long term (current) use of aspirin: Secondary | ICD-10-CM | POA: Insufficient documentation

## 2019-07-19 DIAGNOSIS — F329 Major depressive disorder, single episode, unspecified: Secondary | ICD-10-CM | POA: Insufficient documentation

## 2019-07-19 DIAGNOSIS — I350 Nonrheumatic aortic (valve) stenosis: Secondary | ICD-10-CM | POA: Diagnosis not present

## 2019-07-19 DIAGNOSIS — Z791 Long term (current) use of non-steroidal anti-inflammatories (NSAID): Secondary | ICD-10-CM | POA: Diagnosis not present

## 2019-07-19 DIAGNOSIS — I739 Peripheral vascular disease, unspecified: Secondary | ICD-10-CM | POA: Diagnosis not present

## 2019-07-19 DIAGNOSIS — M069 Rheumatoid arthritis, unspecified: Secondary | ICD-10-CM | POA: Insufficient documentation

## 2019-07-19 DIAGNOSIS — Z880 Allergy status to penicillin: Secondary | ICD-10-CM | POA: Diagnosis not present

## 2019-07-19 DIAGNOSIS — I082 Rheumatic disorders of both aortic and tricuspid valves: Secondary | ICD-10-CM | POA: Diagnosis not present

## 2019-07-19 DIAGNOSIS — I2510 Atherosclerotic heart disease of native coronary artery without angina pectoris: Secondary | ICD-10-CM | POA: Diagnosis not present

## 2019-07-19 DIAGNOSIS — Z79899 Other long term (current) drug therapy: Secondary | ICD-10-CM | POA: Insufficient documentation

## 2019-07-19 DIAGNOSIS — I352 Nonrheumatic aortic (valve) stenosis with insufficiency: Secondary | ICD-10-CM | POA: Diagnosis not present

## 2019-07-19 HISTORY — DX: Nonrheumatic aortic (valve) stenosis: I35.0

## 2019-07-19 HISTORY — PX: RIGHT/LEFT HEART CATH AND CORONARY ANGIOGRAPHY: CATH118266

## 2019-07-19 LAB — POCT I-STAT 7, (LYTES, BLD GAS, ICA,H+H)
Acid-base deficit: 1 mmol/L (ref 0.0–2.0)
Bicarbonate: 23.5 mmol/L (ref 20.0–28.0)
Calcium, Ion: 1.26 mmol/L (ref 1.15–1.40)
HCT: 37 % (ref 36.0–46.0)
Hemoglobin: 12.6 g/dL (ref 12.0–15.0)
O2 Saturation: 100 %
Potassium: 4 mmol/L (ref 3.5–5.1)
Sodium: 141 mmol/L (ref 135–145)
TCO2: 25 mmol/L (ref 22–32)
pCO2 arterial: 38.2 mmHg (ref 32.0–48.0)
pH, Arterial: 7.396 (ref 7.350–7.450)
pO2, Arterial: 236 mmHg — ABNORMAL HIGH (ref 83.0–108.0)

## 2019-07-19 LAB — POCT I-STAT EG7
Acid-base deficit: 1 mmol/L (ref 0.0–2.0)
Bicarbonate: 24.6 mmol/L (ref 20.0–28.0)
Calcium, Ion: 1.3 mmol/L (ref 1.15–1.40)
HCT: 38 % (ref 36.0–46.0)
Hemoglobin: 12.9 g/dL (ref 12.0–15.0)
O2 Saturation: 77 %
Potassium: 4.1 mmol/L (ref 3.5–5.1)
Sodium: 141 mmol/L (ref 135–145)
TCO2: 26 mmol/L (ref 22–32)
pCO2, Ven: 41.8 mmHg — ABNORMAL LOW (ref 44.0–60.0)
pH, Ven: 7.377 (ref 7.250–7.430)
pO2, Ven: 43 mmHg (ref 32.0–45.0)

## 2019-07-19 SURGERY — RIGHT/LEFT HEART CATH AND CORONARY ANGIOGRAPHY
Anesthesia: LOCAL

## 2019-07-19 MED ORDER — HYDRALAZINE HCL 20 MG/ML IJ SOLN: 10.0000 mg | INTRAMUSCULAR | Status: DC | PRN

## 2019-07-19 MED ORDER — MIDAZOLAM HCL 2 MG/2ML IJ SOLN
INTRAMUSCULAR | Status: AC
  Filled 2019-07-19: qty 2

## 2019-07-19 MED ORDER — HEPARIN (PORCINE) IN NACL 1000-0.9 UT/500ML-% IV SOLN
INTRAVENOUS | Status: AC
  Filled 2019-07-19: qty 500

## 2019-07-19 MED ORDER — LABETALOL HCL 5 MG/ML IV SOLN: 10.0000 mg | INTRAVENOUS | Status: DC | PRN

## 2019-07-19 MED ORDER — SODIUM CHLORIDE 0.9 % IV SOLN: 250.0000 mL | INTRAVENOUS | Status: DC | PRN

## 2019-07-19 MED ORDER — LIDOCAINE HCL (PF) 1 % IJ SOLN
INTRAMUSCULAR | Status: AC
  Filled 2019-07-19: qty 30

## 2019-07-19 MED ORDER — SODIUM CHLORIDE 0.9 % WEIGHT BASED INFUSION: 1.0000 mL/kg/h | INTRAVENOUS | Status: DC

## 2019-07-19 MED ORDER — ASPIRIN 81 MG PO CHEW: 81.0000 mg | CHEWABLE_TABLET | ORAL | Status: DC

## 2019-07-19 MED ORDER — ACETAMINOPHEN 325 MG PO TABS: 650.0000 mg | ORAL_TABLET | ORAL | Status: DC | PRN

## 2019-07-19 MED ORDER — SODIUM CHLORIDE 0.9% FLUSH: 3.0000 mL | INTRAVENOUS | Status: DC | PRN

## 2019-07-19 MED ORDER — SODIUM CHLORIDE 0.9% FLUSH: 3.0000 mL | Freq: Two times a day (BID) | INTRAVENOUS | Status: DC

## 2019-07-19 MED ORDER — SODIUM CHLORIDE 0.9 % WEIGHT BASED INFUSION
3.0000 mL/kg/h | INTRAVENOUS | Status: AC
  Administered 2019-07-19: 3 mL/kg/h via INTRAVENOUS

## 2019-07-19 MED ORDER — HEPARIN (PORCINE) IN NACL 1000-0.9 UT/500ML-% IV SOLN
INTRAVENOUS | Status: DC | PRN
  Administered 2019-07-19: 500 mL

## 2019-07-19 MED ORDER — ONDANSETRON HCL 4 MG/2ML IJ SOLN: 4.0000 mg | Freq: Four times a day (QID) | INTRAMUSCULAR | Status: DC | PRN

## 2019-07-19 MED ORDER — MIDAZOLAM HCL 2 MG/2ML IJ SOLN
INTRAMUSCULAR | Status: DC | PRN
  Administered 2019-07-19: 1 mg via INTRAVENOUS

## 2019-07-19 MED ORDER — IOHEXOL 350 MG/ML SOLN
INTRAVENOUS | Status: DC | PRN
  Administered 2019-07-19: 45 mL

## 2019-07-19 MED ORDER — LIDOCAINE HCL (PF) 1 % IJ SOLN
INTRAMUSCULAR | Status: DC | PRN
  Administered 2019-07-19: 12 mL

## 2019-07-19 MED ORDER — HEPARIN (PORCINE) IN NACL 1000-0.9 UT/500ML-% IV SOLN
INTRAVENOUS | Status: DC | PRN
  Administered 2019-07-19 (×2): 500 mL

## 2019-07-19 SURGICAL SUPPLY — 17 items
CATH BALLN WEDGE 5F 110CM (CATHETERS) IMPLANT
CATH INFINITI 5 FR AL2 (CATHETERS) ×2 IMPLANT
CATH INFINITI 5FR AL1 (CATHETERS) ×2 IMPLANT
CATH INFINITI 5FR MULTPACK ANG (CATHETERS) ×2 IMPLANT
CATH SWAN GANZ 7F STRAIGHT (CATHETERS) ×2 IMPLANT
CLOSURE MYNX CONTROL 5F (Vascular Products) ×2 IMPLANT
CLOSURE MYNX CONTROL 6F/7F (Vascular Products) ×2 IMPLANT
GLIDESHEATH SLEND SS 6F .021 (SHEATH) IMPLANT
KIT HEART LEFT (KITS) ×2 IMPLANT
PACK CARDIAC CATHETERIZATION (CUSTOM PROCEDURE TRAY) ×2 IMPLANT
SHEATH GLIDE SLENDER 4/5FR (SHEATH) IMPLANT
SHEATH PINNACLE 5F 10CM (SHEATH) ×2 IMPLANT
SHEATH PINNACLE 7F 10CM (SHEATH) ×2 IMPLANT
TRANSDUCER W/STOPCOCK (MISCELLANEOUS) ×2 IMPLANT
TUBING CIL FLEX 10 FLL-RA (TUBING) IMPLANT
WIRE EMERALD 3MM-J .035X150CM (WIRE) ×2 IMPLANT
WIRE EMERALD ST .035X260CM (WIRE) ×2 IMPLANT

## 2019-07-19 NOTE — Interval H&P Note (Signed)
History and Physical Interval Note:  07/19/2019 11:04 AM  Michaela Simpson  has presented today for surgery, with the diagnosis of aortic stenosis.  The various methods of treatment have been discussed with the patient and family. After consideration of risks, benefits and other options for treatment, the patient has consented to  Procedure(s): RIGHT/LEFT HEART CATH AND CORONARY ANGIOGRAPHY (N/A) as a surgical intervention.  The patient's history has been reviewed, patient examined, no change in status, stable for surgery.  I have reviewed the patient's chart and labs.  Questions were answered to the patient's satisfaction.     Sherren Mocha

## 2019-07-19 NOTE — Progress Notes (Signed)
Up and walked and tolerated well; right groin stable, no bleeding or hematoma 

## 2019-07-19 NOTE — Discharge Instructions (Signed)
Femoral Site Care This sheet gives you information about how to care for yourself after your procedure. Your health care provider may also give you more specific instructions. If you have problems or questions, contact your health care provider. What can I expect after the procedure? After the procedure, it is common to have:  Bruising that usually fades within 1-2 weeks.  Tenderness at the site. Follow these instructions at home: Wound care  Follow instructions from your health care provider about how to take care of your insertion site. Make sure you: ? Wash your hands with soap and water before you change your bandage (dressing). If soap and water are not available, use hand sanitizer. ? Change your dressing as told by your health care provider. ? Leave stitches (sutures), skin glue, or adhesive strips in place. These skin closures may need to stay in place for 2 weeks or longer. If adhesive strip edges start to loosen and curl up, you may trim the loose edges. Do not remove adhesive strips completely unless your health care provider tells you to do that.  Do not take baths, swim, or use a hot tub until your health care provider approves.  You may shower 24-48 hours after the procedure or as told by your health care provider. ? Gently wash the site with plain soap and water. ? Pat the area dry with a clean towel. ? Do not rub the site. This may cause bleeding.  Do not apply powder or lotion to the site. Keep the site clean and dry.  Check your femoral site every day for signs of infection. Check for: ? Redness, swelling, or pain. ? Fluid or blood. ? Warmth. ? Pus or a bad smell. Activity  For the first 2-3 days after your procedure, or as long as directed: ? Avoid climbing stairs as much as possible. ? Do not squat.  Do not lift anything that is heavier than 10 lb (4.5 kg), or the limit that you are told, until your health care provider says that it is safe.  Rest as  directed. ? Avoid sitting for a long time without moving. Get up to take short walks every 1-2 hours.  Do not drive for 24 hours if you were given a medicine to help you relax (sedative). General instructions  Take over-the-counter and prescription medicines only as told by your health care provider.  Keep all follow-up visits as told by your health care provider. This is important. Contact a health care provider if you have:  A fever or chills.  You have redness, swelling, or pain around your insertion site. Get help right away if:  The catheter insertion area swells very fast.  You pass out.  You suddenly start to sweat or your skin gets clammy.  The catheter insertion area is bleeding, and the bleeding does not stop when you hold steady pressure on the area.  The area near or just beyond the catheter insertion site becomes pale, cool, tingly, or numb. These symptoms may represent a serious problem that is an emergency. Do not wait to see if the symptoms will go away. Get medical help right away. Call your local emergency services (911 in the [place] Do not drive yourself to the hospital. Summary  After the procedure, it is common to have bruising that usually fades within 1-2 weeks.  Check your femoral site every day for signs of infection.  Do not lift anything that is heavier than 10 lb (4.5 kg), or the   limit that you are told, until your health care provider says that it is safe. This information is not intended to replace advice given to you by your health care provider. Make sure you discuss any questions you have with your health care provider. Document Revised: 06/20/2017 Document Reviewed: 06/20/2017 Elsevier Patient Education  2020 [employer]  

## 2019-07-20 ENCOUNTER — Telehealth: Payer: Self-pay | Admitting: Cardiovascular Disease

## 2019-07-20 DIAGNOSIS — S72002D Fracture of unspecified part of neck of left femur, subsequent encounter for closed fracture with routine healing: Secondary | ICD-10-CM | POA: Diagnosis not present

## 2019-07-20 DIAGNOSIS — F329 Major depressive disorder, single episode, unspecified: Secondary | ICD-10-CM | POA: Diagnosis not present

## 2019-07-20 DIAGNOSIS — S81831D Puncture wound without foreign body, right lower leg, subsequent encounter: Secondary | ICD-10-CM | POA: Diagnosis not present

## 2019-07-20 DIAGNOSIS — S42302D Unspecified fracture of shaft of humerus, left arm, subsequent encounter for fracture with routine healing: Secondary | ICD-10-CM | POA: Diagnosis not present

## 2019-07-20 DIAGNOSIS — M810 Age-related osteoporosis without current pathological fracture: Secondary | ICD-10-CM | POA: Diagnosis not present

## 2019-07-20 DIAGNOSIS — I10 Essential (primary) hypertension: Secondary | ICD-10-CM | POA: Diagnosis not present

## 2019-07-20 NOTE — Telephone Encounter (Signed)
New Message  i have Michaela Simpson's daughter on the line. she had surgery with dr. Burt Knack on yesterday, and she wants to know when can she take off her bandage   Please call

## 2019-07-20 NOTE — Telephone Encounter (Signed)
I spoke with Michaela Simpson and the pt and advised that the pt can remove bandage from groin at this time. The pt does have a knot about the size of a marble at puncture site and small bruise.  The pt will apply a bandaid to the area.  I advised them to monitor for bleeding, enlarging knot or swelling at site, and pain and to contact the office with any questions or concerns.  I also reviewed the pt's pre TAVR testing instructions and answered questions.

## 2019-07-26 ENCOUNTER — Ambulatory Visit (HOSPITAL_COMMUNITY)
Admit: 2019-07-26 | Discharge: 2019-07-26 | Disposition: A | Discharge status: Home / Self Care | Payer: Medicare Other | Attending: Physician Assistant | Admitting: Physician Assistant

## 2019-07-26 ENCOUNTER — Other Ambulatory Visit: Payer: Self-pay

## 2019-07-26 ENCOUNTER — Ambulatory Visit (HOSPITAL_COMMUNITY)
Admission: RE | Admit: 2019-07-26 | Discharge: 2019-07-26 | Disposition: A | Discharge status: Home / Self Care | Payer: Medicare Other | Source: Ambulatory Visit | Attending: Physician Assistant | Admitting: Physician Assistant

## 2019-07-26 DIAGNOSIS — I350 Nonrheumatic aortic (valve) stenosis: Secondary | ICD-10-CM

## 2019-07-26 DIAGNOSIS — K5730 Diverticulosis of large intestine without perforation or abscess without bleeding: Secondary | ICD-10-CM | POA: Diagnosis not present

## 2019-07-26 DIAGNOSIS — I77810 Thoracic aortic ectasia: Secondary | ICD-10-CM | POA: Diagnosis not present

## 2019-07-26 MED ORDER — IOHEXOL 350 MG/ML SOLN
100.0000 mL | Freq: Once | INTRAVENOUS | Status: AC | PRN
  Administered 2019-07-26: 100 mL via INTRAVENOUS

## 2019-07-26 NOTE — Progress Notes (Signed)
Carotid artery duplex has been completed. Preliminary results can be found in CV Proc through chart review.   07/26/19 9:28 AM Michaela Simpson RVT

## 2019-07-27 ENCOUNTER — Other Ambulatory Visit (HOSPITAL_COMMUNITY): Payer: Private Health Insurance - Indemnity

## 2019-07-27 ENCOUNTER — Ambulatory Visit (HOSPITAL_COMMUNITY): Payer: Private Health Insurance - Indemnity

## 2019-07-27 DIAGNOSIS — I10 Essential (primary) hypertension: Secondary | ICD-10-CM | POA: Diagnosis not present

## 2019-07-27 DIAGNOSIS — S72002D Fracture of unspecified part of neck of left femur, subsequent encounter for closed fracture with routine healing: Secondary | ICD-10-CM | POA: Diagnosis not present

## 2019-07-27 DIAGNOSIS — S81831D Puncture wound without foreign body, right lower leg, subsequent encounter: Secondary | ICD-10-CM | POA: Diagnosis not present

## 2019-07-27 DIAGNOSIS — M810 Age-related osteoporosis without current pathological fracture: Secondary | ICD-10-CM | POA: Diagnosis not present

## 2019-07-27 DIAGNOSIS — F329 Major depressive disorder, single episode, unspecified: Secondary | ICD-10-CM | POA: Diagnosis not present

## 2019-07-27 DIAGNOSIS — S42302D Unspecified fracture of shaft of humerus, left arm, subsequent encounter for fracture with routine healing: Secondary | ICD-10-CM | POA: Diagnosis not present

## 2019-07-31 ENCOUNTER — Other Ambulatory Visit: Payer: Self-pay

## 2019-07-31 DIAGNOSIS — I350 Nonrheumatic aortic (valve) stenosis: Secondary | ICD-10-CM

## 2019-08-01 ENCOUNTER — Institutional Professional Consult (permissible substitution) (INDEPENDENT_AMBULATORY_CARE_PROVIDER_SITE_OTHER): Payer: Medicare Other | Admitting: Surgery

## 2019-08-01 ENCOUNTER — Encounter: Payer: Self-pay | Admitting: Surgery

## 2019-08-01 ENCOUNTER — Other Ambulatory Visit: Payer: Self-pay

## 2019-08-01 VITALS — BP 134/76 | HR 80 | Temp 97.9°F | Resp 16 | Ht 60.0 in | Wt 104.6 lb

## 2019-08-01 DIAGNOSIS — I350 Nonrheumatic aortic (valve) stenosis: Secondary | ICD-10-CM

## 2019-08-01 DIAGNOSIS — I70233 Atherosclerosis of native arteries of right leg with ulceration of ankle: Secondary | ICD-10-CM

## 2019-08-01 NOTE — Progress Notes (Signed)
Patient ID: Michaela Simpson, female   DOB: [birth year], 86 y.o.   MRN: [id number]  [place] SURGERY CONSULTATION REPORT  Referring Provider is Ghimire, Henreitta Leber, MD Primary Cardiologist is No primary care provider on file. PCP is Celene Squibb, MD  Chief Complaint  Patient presents with  . Aortic Stenosis    TAVR EVAL..review all completed studies/proocedures    HPI:  The patient is an 86 year old woman with a history of hypertension, rheumatoid arthritis, peripheral arterial disease status post right SFA stenting in 04/2019 to heal the right lower leg ulcer, and aortic stenosis.  She was hospitalized in September 2020 after a fall resulting in a left hip fracture and left humerus fracture.  She underwent ORIF of the hip fracture and nonoperative treatment of her left humeral fracture.  During that hospitalization she was noted to have a heart murmur and echocardiogram showed severe aortic stenosis.  She was seen by Dr. Burt Knack for evaluation in October 2020 and felt to be a reasonable candidate for TAVR but it was felt that she needed more time to recover from her recent trauma.  She is here today with her daughter.  She has been walking with a walker or cane.  She still has significant pain in her left hip and thigh which has limited her mobility.  She said that she may need further surgery on the left hip.  She denies any shortness of breath or chest pain.  She denies orthopnea.  She has had no dizziness or syncope.  She has had no lower extremity edema.  Her daughter does report that she has been fatigued and short of breath with activity over the past year and had to rest when they went for walks but thought it was probably because she was getting older.  The patient has been widowed for about 10 years.  She lives alone.  She has a son and daughter live in [place] and a daughter lives in [place].   Past Medical  History:  Diagnosis Date  . AKI (acute kidney injury) ([place])   . Anemia   . Aortic stenosis   . Depression   . Hip fracture ([place])   . HTN (hypertension)   . Humerus fracture   . Leukocytosis   . Osteoporosis   . Rheumatoid arthritis ([place])   . Ulcer of lower extremity [facility])     Past Surgical History:  Procedure Laterality Date  . ABDOMINAL AORTOGRAM W/LOWER EXTREMITY N/A 05/08/2019   Procedure: ABDOMINAL AORTOGRAM W/LOWER EXTREMITY;  Surgeon: Serafina Mitchell, MD;  Location: [place] [facility];  Service: Cardiovascular;  Laterality: N/A;  . HIP PINNING,CANNULATED Left 03/09/2019   Procedure: CANNULATED HIP PINNING;  Surgeon: Mcarthur Rossetti, MD;  Location: [place];  Service: Orthopedics;  Laterality: Left;  . INTRAMEDULLARY (IM) NAIL INTERTROCHANTERIC Right 03/14/2015   Procedure: RIGHT INTERTROCHANTRIC INTRAMEDULLARY (IM) NAIL ;  Surgeon: Mcarthur Rossetti, MD;  Location: [place];  Service: Orthopedics;  Laterality: Right;  . PERIPHERAL VASCULAR INTERVENTION Right 05/08/2019   Procedure: PERIPHERAL VASCULAR INTERVENTION;  Surgeon: Serafina Mitchell, MD;  Location: [place] [facility];  Service: Cardiovascular;  Laterality: Right;  . RIGHT/LEFT HEART CATH AND CORONARY ANGIOGRAPHY N/A 07/19/2019   Procedure: RIGHT/LEFT HEART CATH AND CORONARY ANGIOGRAPHY;  Surgeon: Sherren Mocha, MD;  Location: [place] [facility];  Service: Cardiovascular;  Laterality: N/A;    Family History  Problem Relation Age of Onset  .  Cancer Sister     Social History   Socioeconomic History  . Marital status: Widowed    Spouse name: Not on file  . Number of children: Not on file  . Years of education: Not on file  . Highest education level: Not on file  Occupational History  . Not on file  Tobacco Use  . Smoking status: Never Smoker  . Smokeless tobacco: Never Used  Substance and Sexual Activity  . Alcohol use: No  . Drug use: No  . Sexual activity: Not on file  Other Topics Concern  .  Not on file  Social History Narrative  . Not on file   Social Determinants of Health   Financial Resource Strain: Low Risk   . Difficulty of Paying Living Expenses: Not hard at all  Food Insecurity: No Food Insecurity  . Worried About Charity fundraiser in the Last Year: Never true  . Ran Out of Food in the Last Year: Never true  Transportation Needs: No Transportation Needs  . Lack of Transportation (Medical): No  . Lack of Transportation (Non-Medical): No  Physical Activity: Unknown  . Days of Exercise per Week: Patient refused  . Minutes of Exercise per Session: Patient refused  Stress: No Stress Concern Present  . Feeling of Stress : Not at all  Social Connections: Unknown  . Frequency of Communication with Friends and Family: Patient refused  . Frequency of Social Gatherings with Friends and Family: Patient refused  . Attends Religious Services: Patient refused  . Active Member of Clubs or Organizations: Patient refused  . Attends Archivist Meetings: Patient refused  . Marital Status: Patient refused  Intimate Partner Violence: Unknown  . Fear of Current or Ex-Partner: Patient refused  . Emotionally Abused: Patient refused  . Physically Abused: Patient refused  . Sexually Abused: Patient refused    Current Outpatient Medications  Medication Sig Dispense Refill  . acetaminophen (TYLENOL) 325 MG tablet Take 2 tablets (650 mg total) by mouth every 6 (six) hours as needed for mild pain (or Fever >/= 101).    Marland Kitchen amLODipine (NORVASC) 2.5 MG tablet Take 1 tablet (2.5 mg total) by mouth at bedtime. 30 tablet 0  . aspirin EC 81 MG tablet Take 81 mg by mouth daily.    . [place]-Glucosamine-Vit D (OSTEO BI-FLEX ONE PER DAY PO) Take 1 tablet by mouth daily.    . clopidogrel (PLAVIX) 75 MG tablet Take 1 tablet (75 mg total) by mouth daily. 30 tablet 11  . Lactobacillus (PROBIOTIC ACIDOPHILUS PO) Take 1 tablet by mouth daily.    Marland Kitchen lactose free nutrition (BOOST) LIQD Take  237 mLs by mouth 4 (four) times daily.    Marland Kitchen LORazepam (ATIVAN) 0.5 MG tablet Take 0.5 mg by mouth at bedtime.     . Misc Natural Products (OSTEO BI-FLEX JOINT SHIELD PO) Take 1 tablet by mouth daily.     . Multiple Vitamins-Minerals (MULTIVITAMIN WITH MINERALS) tablet Take 1 tablet by mouth daily.    . Polyethylene Glycol 3350 (MIRALAX PO) Take 17 g by mouth daily as needed (constipation.).     Marland Kitchen rosuvastatin (CRESTOR) 10 MG tablet Take 1 tablet (10 mg total) by mouth at bedtime. 30 tablet 11  . sertraline (ZOLOFT) 100 MG tablet Take 100 mg by mouth daily.    . vitamin B-12 (CYANOCOBALAMIN) 1000 MCG tablet Take 1,000 mcg by mouth daily.     No current facility-administered medications for this visit.    Allergies  Allergen Reactions  . Bee Pollen   . Penicillins Nausea And Vomiting and Other (See Comments)    Child hood allergy - Flu symptoms; constant vomiting  Did it involve swelling of the face/tongue/throat, SOB, or low BP? Unknown Did it involve sudden or severe rash/hives, skin peeling, or any reaction on the inside of your mouth or nose? Unknown Did you need to seek medical attention at a hospital or doctor's office? Unknown When did it last happen? If all above answers are "NO", may proceed with cephalosporin use.   . Codeine Nausea And Vomiting and Rash      Review of Systems:   General:  normal appetite, + decreased energy, no weight gain, no weight loss, no fever  Cardiac:  no chest pain with exertion, no chest pain at rest, no SOB with  exertion, no resting SOB, no PND, no orthopnea, no palpitations, no arrhythmia, no atrial fibrillation, no LE edema, no dizzy spells, no syncope  Respiratory:  no shortness of breath, no home oxygen, no productive cough, no dry cough, no bronchitis, no wheezing, no hemoptysis, no asthma, no pain with inspiration or cough, no sleep apnea, no CPAP at night  GI:   no difficulty swallowing, no reflux, no frequent heartburn, no hiatal  hernia, no abdominal pain, no constipation, no diarrhea, no hematochezia, no hematemesis, no melena  GU:   no dysuria,  no frequency, no urinary tract infection, no hematuria, no kidney stones, no kidney disease  Vascular:  no pain suggestive of claudication, no pain in feet, no leg cramps, no varicose veins, no DVT, no non-healing foot ulcer  Neuro:   no stroke, no TIA's, no seizures, no headaches, no temporary blindness one eye,  no slurred speech, no peripheral neuropathy, + chronic left hip pain, + instability of gait, no memory/cognitive dysfunction  Musculoskeletal: + arthritis, no joint swelling, no myalgias, no difficulty walking, + reduced mobility   Skin:   no rash, no itching, no skin infections, no pressure sores or ulcerations  Psych:   no anxiety, no depression, no nervousness, no unusual recent stress  Eyes:   no blurry vision, no floaters, no recent vision changes, + wears glasses or contacts  ENT:   no hearing loss, no loose or painful teeth, no dentures, last saw dentist prior to Covid  Hematologic:  no easy bruising, no abnormal bleeding, no clotting disorder, no frequent epistaxis  Endocrine:  no diabetes, does not check CBG's at home      Physical Exam:   BP 134/76 (BP Location: Left Arm, Patient Position: Sitting, Cuff Size: Normal)   Pulse 80   Temp 97.9 F (36.6 C)   Resp 16   Ht 5' (1.524 m)   Wt 104 lb 9.6 oz (47.4 kg)   HC 62" (157.5 cm)   SpO2 98% Comment: RA  BMI 20.43 kg/m   General:  Elderly but  well-appearing  HEENT:  Unremarkable, NCAT, PERLA, EOMI  Neck:   no JVD, no bruits, no adenopathy   Chest:   clear to auscultation, symmetrical breath sounds, no wheezes, no rhonchi   CV:   RRR, grade lll/VI crescendo/decrescendo murmur heard best at RSB,  no diastolic murmur  Abdomen:  soft, non-tender, no masses   Extremities:  warm, well-perfused, pulses palpable, no LE edema, dressing right lower leg which she says she uses to protect the previous ulcer  which is now healed.  Rectal/GU  Deferred  Neuro:   Grossly non-focal and symmetrical throughout  Skin:  Clean and dry, no rashes, no breakdown   Diagnostic Tests:  ECHOCARDIOGRAM REPORT       Patient Name:  ADISSON LUEBBERING Date of Exam: 03/09/2019  Medical Rec #: [id number]      Height:    62.0 in  Accession #:  [id number]      Weight:    113.3 lb  Date of Birth: 1932-01-10      BSA:     1.50 m  Patient Age:  75 years       BP:      146/76 mmHg  Patient Gender: F          HR:      75 bpm.  Exam Location: Inpatient   Procedure: 2D Echo   Indications:  murmur 785.2    History:    Patient has no prior history of Echocardiogram  examinations.    Sonographer:  Johny Chess  Referring Phys: [place]    1. Left ventricular ejection fraction, by visual estimation, is 65 to  70%. The left ventricle has normal function. Normal left ventricular size.  There is no left ventricular hypertrophy.  2. Global right ventricle has normal systolic function.The right  ventricular size is normal. No increase in right ventricular wall  thickness.  3. Left atrial size was normal.  4. Right atrial size was normal.  5. The mitral valve is normal in structure. No evidence of mitral valve  regurgitation. No evidence of mitral stenosis.  6. The tricuspid valve is normal in structure. Tricuspid valve  regurgitation was not visualized by color flow Doppler.  7. Aortic valve mean gradient measures 54.0 mmHg.  8. Aortic valve peak gradient measures 84.6 mmHg.  9. The aortic valve is tricuspid with severely calcified leaflets. Aortic  valve regurgitation was not visualized by color flow Doppler. Severe  aortic valve stenosis.  10. The pulmonic valve was normal in structure. Pulmonic valve  regurgitation is not visualized by color flow Doppler.  11. The inferior vena cava is  normal in size with greater than 50%  respiratory variability, suggesting right atrial pressure of 3 mmHg.  12. Left ventricular diastolic Doppler parameters are consistent with  impaired relaxation pattern of LV diastolic filling.  13. Elevated left ventricular end-diastolic pressure.  14. Moderate mitral annular calcification.   FINDINGS  Left Ventricle: Left ventricular ejection fraction, by visual estimation,  is 65 to 70%. The left ventricle has normal function. There is no left  ventricular hypertrophy. Normal left ventricular size. Spectral Doppler  shows Left ventricular diastolic  Doppler parameters are consistent with impaired relaxation pattern of LV  diastolic filling. Elevated left ventricular end-diastolic pressure.   Right Ventricle: The right ventricular size is normal. No increase in  right ventricular wall thickness. Global RV systolic function is has  normal systolic function.   Left Atrium: Left atrial size was normal in size.   Right Atrium: Right atrial size was normal in size   Pericardium: There is no evidence of pericardial effusion.   Mitral Valve: The mitral valve is normal in structure. There is mild  thickening of the mitral valve leaflet(s). There is mild calcification of  the mitral valve leaflet(s). Moderate mitral annular calcification. No  evidence of mitral valve stenosis by  observation. No evidence of mitral valve regurgitation.   Tricuspid Valve: The tricuspid valve is normal in structure. Tricuspid  valve regurgitation was not visualized by color flow Doppler.   Aortic Valve: The aortic  valve is tricuspid. Aortic valve regurgitation  was not visualized by color flow Doppler. Severe aortic stenosis is  present. Aortic valve mean gradient measures 54.0 mmHg. Aortic valve peak  gradient measures 84.6 mmHg. Aortic  valve area, by VTI measures 0.61 cm.   Pulmonic Valve: The pulmonic valve was normal in structure. Pulmonic valve    regurgitation is not visualized by color flow Doppler.   Aorta: The aortic root, ascending aorta and aortic arch are all  structurally normal, with no evidence of dilitation or obstruction.   Venous: The inferior vena cava is normal in size with greater than 50%  respiratory variability, suggesting right atrial pressure of 3 mmHg.   IAS/Shunts: No atrial level shunt detected by color flow Doppler. No  ventricular septal defect is seen or detected. There is no evidence of an  atrial septal defect.      LEFT VENTRICLE     Normals  PLAX 2D  LVIDd:     4.00 cm 3.6 cm  Diastology         Normals  LVIDs:     2.80 cm 1.7 cm  LV e' lateral:  7.07 cm/s 6.42 cm/s  LV PW:     0.70 cm 1.4 cm  LV E/e' lateral: 11.4   15.4  LV IVS:    0.70 cm 1.3 cm  LV e' medial:  4.68 cm/s 6.96 cm/s  LVOT diam:   1.90 cm 2.0 cm  LV E/e' medial: 17.2   6.96  LV SV:     40 ml  79 ml  LV SV Index:  26.96  45 ml/m2  LVOT Area:   2.84 cm 3.14 cm2     RIGHT VENTRICLE  RV S prime:   13.30 cm/s  TAPSE (M-mode): 1.9 cm   LEFT ATRIUM       Index    RIGHT ATRIUM     Index  LA diam:    2.50 cm 1.66 cm/m RA Area:   9.77 cm  LA Vol (A2C):  31.6 ml 21.04 ml/m RA Volume:  19.70 ml 13.12 ml/m  LA Vol (A4C):  26.6 ml 17.71 ml/m  LA Biplane Vol: 29.8 ml 19.85 ml/m  AORTIC VALVE          Normals  AV Area (Vmax):  0.59 cm  AV Area (Vmean):  0.57 cm   3.06 cm2  AV Area (VTI):   0.61 cm  AV Vmax:      460.00 cm/s  AV Vmean:     348.500 cm/s 77 cm/s  AV VTI:      1.110 m   3.15 cm2  AV Peak Grad:   84.6 mmHg  AV Mean Grad:   54.0 mmHg  3 mmHg  LVOT Vmax:     96.10 cm/s  LVOT Vmean:    70.100 cm/s 75 cm/s  LVOT VTI:     0.239 m   25.3 cm  LVOT/AV VTI ratio: 0.22     1    AORTA         Normals  Ao Root diam: 3.20 cm 31 mm   MV E velocity: 80.50  cm/s 103 cm/s  MV A velocity: 121.00 cm/s 70.3 cm/s SHUNTS  MV E/A ratio: 0.67    1.5    Systemic VTI: 0.24 m                    Systemic Diam: 1.90 cm     Ena Dawley MD  Electronically signed  by Ena Dawley MD  Signature Date/Time: 03/09/2019/4:55:51 PM     Physicians Panel Physicians Referring Physician Case Authorizing Physician  Sherren Mocha, MD (Primary)       Procedures RIGHT/LEFT HEART CATH AND CORONARY ANGIOGRAPHY     Conclusion 1. Severe single vessel CAD involving a moderate caliber (2 mm) first diagonal branch of the LAD, otherwise widely patent left main, LAD, LCx, and RCA  2. Severe calcific AS with mean gradient 50 mmHg and AVA 0.56 square cm  3. Normal right heart pressures  Plan: continue TAVR evaluation. Medical therapy for CAD (branch vessel disease, pt without angina)           Indications Severe aortic stenosis [I35.0 (ICD-10-CM)]     Procedural Details Technical Details INDICATION: Severe, Stage D1 aortic stenosis  PROCEDURAL DETAILS: The right groin is prepped, draped, and anesthetized with 1% lidocaine. Using direct ultrasound guidance a 5 French sheath is placed in the right femoral artery and a 7 French sheath is placed in the right femoral vein. [place] images are captured and stored in the patient's chart. A Swan-Ganz catheter is used for the right heart catheterization. Standard protocol is followed for recording of right heart pressures and sampling of oxygen saturations. Fick cardiac output is calculated. Standard Judkins catheters are used for selective coronary angiography. LV pressure is recorded and an aortic valve pullback gradient is recorded. The aortic valve is crossed with an AL-2 catheter and straight wire. Mynx closure is used for femoral venous and arterial hemostasis. There are no immediate procedural complications. The patient is transferred to the post catheterization recovery area for further  monitoring.    Estimated blood loss <50 mL.   During this procedure medications were administered to achieve and maintain moderate conscious sedation while the patient's heart rate, blood pressure, and oxygen saturation were continuously monitored and I was present face-to-face 100% of this time.     Medications (Filter: Administrations occurring from 07/19/19 1108 to 07/19/19 1205)  Continuous medications are totaled by the amount administered until 07/19/19 1205.  midazolam (VERSED) injection (mg) Total dose: 1 mg  Date/Time   Rate/Dose/Volume Action  07/19/19 1125  1 mg Given  lidocaine (PF) (XYLOCAINE) 1 % injection (mL) Total volume: 12 mL  Date/Time   Rate/Dose/Volume Action  07/19/19 1129  12 mL Given  Heparin (Porcine) in NaCl 1000-0.9 UT/500ML-% SOLN (mL) Total volume: 1,000 mL  Date/Time   Rate/Dose/Volume Action  07/19/19 1129  500 mL Given  1129  500 mL Given  iohexol (OMNIPAQUE) 350 MG/ML injection (mL) Total volume: 45 mL  Date/Time   Rate/Dose/Volume Action  07/19/19 1201  45 mL Given  Heparin (Porcine) in NaCl 1000-0.9 UT/500ML-% SOLN (mL) Total volume: 500 mL  Date/Time   Rate/Dose/Volume Action  07/19/19 1139  500 mL Given     Sedation Time Sedation Time Physician-1: 30 minutes 38 seconds        Contrast Medication Name Total Dose  iohexol (OMNIPAQUE) 350 MG/ML injection 45 mL     Radiation/Fluoro Fluoro time: 6.5 (min)  DAP: 8538 (mGycm2)  Cumulative Air Kerma: 138 (mGy)        Coronary Findings Diagnostic Dominance: Right  Left Main  Vessel is angiographically normal.   Left Anterior Descending  The vessel exhibits minimal luminal irregularities.   First Diagonal Branch  1st Diag lesion 80% stenosed  1st Diag lesion is 80% stenosed.   Left Circumflex  The vessel exhibits minimal luminal irregularities.   Right Coronary Artery  Vessel  is angiographically normal.  Intervention No interventions have been documented.                          Left Heart Aortic Valve There is severe aortic valve stenosis. The aortic valve is calcified. There is restricted aortic valve motion. Severe aortic stenosis with peak to peak gradient 69 mmHg, mean gradient 50 mmhg, and calculated AVA 0.56 square cm     Coronary Diagrams Diagnostic Dominance: Right  &&&&&  Intervention      Implants  Vascular Products  Closure Mynx Control 65f/13f [id number] - Implanted  Inventory item: CLOSURE [facility] CONTROL 20F/53F Model/Cat number: [id number]  Manufacturer: [place] Lot number: [id number]  Device identifier: [id number] Device identifier type: GS1  GUDID Information   Request status Successful     Brand name: [facility] CONTROL Version/Model: [id number]   Company name: [employer] safety info as of 07/19/19: MR Safe   Contains dry or latex rubber: No     GMDN P.T. name: Wound hydrogel dressing, non-antimicrobial     As of 07/19/2019    Status: Implanted      Closure Mynx Control 26f - [id number] - Implanted  Inventory item: CLOSURE [facility] CONTROL 36F Model/Cat number: [id number]  Manufacturer: [employer] Lot number: [id number]  Device identifier: [id number] Device identifier type: GS1  GUDID Information   Request status Successful     Brand name: MYNX CONTROL Version/Model: [id number]   Company name: [employer] safety info as of 07/19/19: MR Safe   Contains dry or latex rubber: No     GMDN P.T. name: Wound hydrogel dressing, non-antimicrobial     As of 07/19/2019    Status: Implanted          Syngo Images Link to Procedure Log  Show images for CARDIAC CATHETERIZATION Procedure Log     Images on Long Term Storage   Show images for Romeka, Blackwood P "Bea"         Hemo Data   Most Recent Value  Fick Cardiac Output 4.66 L/min  Fick Cardiac Output Index 3.22 (L/min)/BSA  Aortic Mean Gradient 50.22 mmHg  Aortic Peak Gradient 69 mmHg  Aortic Valve Area 0.56  Aortic  Value Area Index 0.38 cm2/BSA  RA A Wave 3 mmHg  RA V Wave 1 mmHg  RA Mean 0 mmHg  RV Systolic Pressure 25 mmHg  RV Diastolic Pressure -3 mmHg  RV EDP 0 mmHg  PA Systolic Pressure 22 mmHg  PA Diastolic Pressure 1 mmHg  PA Mean 14 mmHg  PW A Wave 8 mmHg  PW V Wave 7 mmHg  PW Mean 6 mmHg  AO Systolic Pressure 0000000 mmHg  AO Diastolic Pressure 58 mmHg  AO Mean 90 mmHg  LV Systolic Pressure 99991111 mmHg  LV Diastolic Pressure 3 mmHg  LV EDP 11 mmHg  AOp Systolic Pressure 123456 mmHg  AOp Diastolic Pressure 62 mmHg  AOp Mean Pressure 96 mmHg  LVp Systolic Pressure 123456 mmHg  LVp Diastolic Pressure 3 mmHg  LVp EDP Pressure 8 mmHg  QP/QS 1  TPVR Index 4.34 HRUI  TSVR Index 27.91 HRUI  TPVR/TSVR Ratio 0.16    ADDENDUM REPORT: 07/26/2019 22:12  CLINICAL DATA:  86 year old female with severe aortic stenosis being evaluated for a TAVR procedure.  EXAM: Cardiac TAVR CT  TECHNIQUE: The patient was scanned on a [employer]. A 120 kV retrospective scan was triggered in  the descending thoracic aorta at 111 HU's. Gantry rotation speed was 250 msecs and collimation was .6 mm. No beta blockade or nitro were given. The 3D data set was reconstructed in 5% intervals of the R-R cycle. Systolic and diastolic phases were analyzed on a dedicated work station using MPR, MIP and VRT modes. The patient received 80 cc of contrast.  FINDINGS: Aortic Valve: Trileaflet aortic valve with severely calcified leaflets and moderately restricted leaflet opening, no calcifications are extending into the LVOT.  Aorta: There is mild ascending aortic aneurysm with maximum diameter 43 mm.  Sinotubular Junction: 31 x 30 mm  Ascending Thoracic Aorta: 43 x 43 mm  Aortic Arch: 25 x 25 mm  Descending Thoracic Aorta: 25 x 24 mm  Sinus of Valsalva Measurements:  Non-coronary: 32 mm  Right -coronary: 32 mm  Left -coronary: 30 mm  Coronary Artery Height above Annulus:  Left Main:  12 mm  Right Coronary: 13 mm  Virtual Basal Annulus Measurements:  Maximum/Minimum Diameter: 24.2 x 19.7 mm  Mean Diameter: 21.6 mm  Perimeter: 69.4 mm  Area: 367 mm2  Optimum Fluoroscopic Angle for Delivery: LAO 19 CAU 9  IMPRESSION: 1. Trileaflet aortic valve with severely calcified leaflets and moderately restricted leaflet opening, no calcifications are extending into the LVOT. Aortic valve calcium score is 2623 consistent with severe aortic stenosis. Annular measurements are suitable for delivery of a 23 mm Edwards-SAPIEN 3 Ultra valve.  2. Sufficient coronary to annulus distance.  3. Optimum Fluoroscopic Angle for Delivery:  LAO 19 CAU 9  4. No thrombus in the left atrial appendage.  5. There is mild ascending aortic aneurysm with maximum diameter 43 mm.   Electronically Signed   By: Ena Dawley   On: 07/26/2019 22:12   Addended by Dorothy Spark, MD on 07/26/2019 10:14 PM    Study Result  EXAM: OVER-READ INTERPRETATION  CT CHEST  The following report is an over-read performed by radiologist Dr. Salvatore Marvel of [facility], [place] on 07/26/2019. This over-read does not include interpretation of cardiac or coronary anatomy or pathology. The coronary CTA interpretation by the cardiologist is attached.  COMPARISON:  None.  FINDINGS: Please see the separate concurrent chest CT angiogram report for details.  IMPRESSION: Please see the separate concurrent chest CT angiogram report for details.  Electronically Signed: By: Ilona Sorrel M.D. On: 07/26/2019 10:39      CLINICAL DATA:  Severe aortic stenosis.  Pre-TAVR evaluation.  EXAM: CT ANGIOGRAPHY CHEST, ABDOMEN AND PELVIS  TECHNIQUE: Multidetector CT imaging through the chest, abdomen and pelvis was performed using the standard protocol during bolus administration of intravenous contrast. Multiplanar reconstructed images and MIPs were obtained and reviewed to  evaluate the vascular anatomy.  CONTRAST:  121mL OMNIPAQUE IOHEXOL 350 MG/ML SOLN  COMPARISON:  None.  FINDINGS: CTA CHEST FINDINGS  Cardiovascular: Mild cardiomegaly. No significant pericardial effusion/thickening. Diffuse thickening and coarse calcification of the aortic valve. Atherosclerotic thoracic aorta with ectatic 4.4 cm ascending thoracic aorta. Normal caliber pulmonary arteries. No central pulmonary emboli.  Mediastinum/Nodes: Subcentimeter hypodense left thyroid nodule requires no follow-up. Unremarkable esophagus. No pathologically enlarged axillary, mediastinal or hilar lymph nodes.  Lungs/Pleura: No pneumothorax. No pleural effusion. Nodular 1.6 cm subpleural somewhat bandlike opacity in the peripheral left lower lobe adjacent to healed lateral left eighth rib deformity (series 16/image 79). Otherwise no consolidative airspace disease, lung masses or additional significant pulmonary nodules. Mosaic attenuation throughout both lungs with diffuse bronchial wall thickening.  Musculoskeletal: No aggressive appearing  focal osseous lesions. Chronic appearing moderate T12 vertebral compression fracture. Moderate thoracic spondylosis.  CTA ABDOMEN AND PELVIS FINDINGS  Hepatobiliary: Normal liver with no liver mass. Normal gallbladder with no radiopaque cholelithiasis. No biliary ductal dilatation.  Pancreas: Normal, with no mass or duct dilation.  Spleen: Normal size. No mass.  Adrenals/Urinary Tract: No discrete adrenal nodules. No hydronephrosis. Simple 4.0 cm lower left renal cyst. Subcentimeter hypodense upper left renal cortical lesion is too small to characterize and requires no follow-up. No contour deforming right renal masses. Normal bladder.  Stomach/Bowel: Normal non-distended stomach. Normal caliber small bowel with no small bowel wall thickening. Normal appendix. Mild sigmoid diverticulosis, with no large bowel wall thickening  or significant pericolonic fat stranding.  Vascular/Lymphatic: Atherosclerotic nonaneurysmal abdominal aorta. Partially visualized right superficial femoral artery stent. No pathologically enlarged lymph nodes in the abdomen or pelvis.  Reproductive: Grossly normal uterus.  No adnexal mass.  Other: No pneumoperitoneum, ascites or focal fluid collection. Nonspecific fat and soft tissue stranding anterior to the right common femoral vessels, without discrete collection or mass, presumably post procedural change. Superficial subcutaneous 1.4 cm lesion in right inguinal region (series 15/image 185) with soft tissue density.  Musculoskeletal: No aggressive appearing focal osseous lesions. Partially visualized fixation hardware in the proximal femora bilaterally. Chronic appearing moderate L1, L2 and L4 vertebral compression fractures. Mild lumbar spondylosis.  VASCULAR MEASUREMENTS PERTINENT TO TAVR:  AORTA:  Minimal Aortic Diameter-11.8 x 11.6 mm  Severity of Aortic Calcification-moderate  RIGHT PELVIS:  Right Common Iliac Artery -  Minimal Diameter-6.3 x 6.3 mm  Tortuosity-mild  Calcification-mild  Right External Iliac Artery -  Minimal Diameter-5.9 x 5.7 mm  Tortuosity-moderate  Calcification-none  Right Common Femoral Artery -  Minimal Diameter-5.1 x 4.5 mm  Tortuosity-mild  Calcification-moderate  LEFT PELVIS:  Left Common Iliac Artery -  Minimal Diameter-6.4 x 6.2 mm  Tortuosity-mild  Calcification-mild  Left External Iliac Artery -  Minimal Diameter-5.5 x 5.0 mm  Tortuosity-mild-to-moderate  Calcification-none  Left Common Femoral Artery -  Minimal Diameter-3.9 x 3.7 mm  Tortuosity-mild  Calcification-mild  Review of the MIP images confirms the above findings.  IMPRESSION: 1. Vascular findings and measurements pertinent to potential TAVR procedure, as detailed. 2. Diffuse thickening and coarse  calcification of the aortic valve, compatible with the reported history of severe aortic stenosis. 3. Mild cardiomegaly. 4. Ectatic 4.4 cm ascending thoracic aorta. Recommend annual imaging followup by CTA or MRA. This recommendation follows 2010 ACCF/AHA/AATS/ACR/ASA/SCA/SCAI/SIR/STS/SVM Guidelines for the Diagnosis and Management of Patients with Thoracic Aortic Disease. Circulation. 2010; 121: [id number]. 5. Nonspecific nodular 1.6 cm pulmonary opacity adjacent to healed deformity in the lateral left eighth rib, favoring posttraumatic scarring. Suggest attention on follow-up chest CT in 3 months. 6. Diffuse bronchial wall thickening, nonspecific, as can be seen with chronic bronchitis or reactive airways disease. 7. Nonspecific mosaic attenuation throughout both lungs, most commonly due to air trapping from small airways disease. 8. Mild sigmoid diverticulosis. 9. Chronic appearing multilevel thoracolumbar vertebral compression fractures as detailed. 10.  Aortic Atherosclerosis (ICD10-I70.0).   Electronically Signed   By: Ilona Sorrel M.D.   On: 07/26/2019 11:25    Impression:  This 86 year old woman has stage D, severe, symptomatic aortic stenosis with New York Heart Association class II symptoms of exertional fatigue and shortness of breath consistent with chronic diastolic congestive heart failure which were more noticeable prior to her left hip fracture and repair when she was more active.  I have personally reviewed her 2D echocardiogram, cardiac catheterization,  and CTA studies.  Her 2D echocardiogram from September 2020 showed a severely calcified aortic valve with restricted mobility.  It appears to be trileaflet.  The mean gradient was measured at 54 mmHg with a peak gradient of 85 mmHg consistent with severe aortic stenosis.  Left ventricular ejection fraction was 65 to 70%.  Cardiac catheterization showed 80% stenosis of a moderate size diagonal branch with no other  significant disease present.  The patient has not had any anginal symptoms.  The mean gradient across aortic valve was measured at 50 mmHg with an aortic valve area of 0.56 cm consistent with severe aortic stenosis.  I agree that aortic valve placement is indicated in this patient who has critical aortic stenosis to prevent progressive left ventricular deterioration.  She was more symptomatic prior to her hip fracture and repair last fall.  I think she would have a difficult time with open surgical aortic valve replacement due to her age and reduced mobility related to her arthritis and left hip fracture repair with continued pain related to that.  I think transcatheter aortic valve replacement would be the best option for him.  Her gated cardiac CTA shows anatomy suitable for transcatheter aortic valve replacement using an Edwards SAPIEN 3 valve.  Her abdominal and pelvic CTA shows adequate pelvic vascular anatomy to allow transfemoral insertion.  The patient and her daughter were counseled at length regarding treatment alternatives for management of severe symptomatic aortic stenosis. The risks and benefits of surgical intervention has been discussed in detail. Long-term prognosis with medical therapy was discussed. Alternative approaches such as conventional surgical aortic valve replacement, transcatheter aortic valve replacement, and palliative medical therapy were compared and contrasted at length. This discussion was placed in the context of the patient's own specific clinical presentation and past medical history. All of their questions have been addressed.   Following the decision to proceed with transcatheter aortic valve replacement, a discussion was held regarding what types of management strategies would be attempted intraoperatively in the event of life-threatening complications, including whether or not the patient would be considered a candidate for the use of cardiopulmonary bypass and/or  conversion to open sternotomy for attempted surgical intervention.  I do not think she would be a candidate for emergent sternotomy to manage any intraoperative complications due to her advanced age and fragility.  The patient has been advised of a variety of complications that might develop including but not limited to risks of death, stroke, paravalvular leak, aortic dissection or other major vascular complications, aortic annulus rupture, device embolization, cardiac rupture or perforation, mitral regurgitation, acute myocardial infarction, arrhythmia, heart block or bradycardia requiring permanent pacemaker placement, congestive heart failure, respiratory failure, renal failure, pneumonia, infection, other late complications related to structural valve deterioration or migration, or other complications that might ultimately cause a temporary or permanent loss of functional independence or other long term morbidity. The patient provides full informed consent for the procedure as described and all questions were answered.    Plan:  She will be scheduled for transfemoral transcatheter aortic valve replacement on Tuesday, 08/07/2019.   I spent 60 minutes performing this consultation and > 50% of this time was spent face to face counseling and coordinating the care of this patient's severe symptomatic aortic stenosis.    Gaye Pollack, MD 08/01/2019

## 2019-08-02 DIAGNOSIS — S81831D Puncture wound without foreign body, right lower leg, subsequent encounter: Secondary | ICD-10-CM | POA: Diagnosis not present

## 2019-08-02 DIAGNOSIS — M810 Age-related osteoporosis without current pathological fracture: Secondary | ICD-10-CM | POA: Diagnosis not present

## 2019-08-02 DIAGNOSIS — I10 Essential (primary) hypertension: Secondary | ICD-10-CM | POA: Diagnosis not present

## 2019-08-02 DIAGNOSIS — S72002D Fracture of unspecified part of neck of left femur, subsequent encounter for closed fracture with routine healing: Secondary | ICD-10-CM | POA: Diagnosis not present

## 2019-08-02 DIAGNOSIS — F329 Major depressive disorder, single episode, unspecified: Secondary | ICD-10-CM | POA: Diagnosis not present

## 2019-08-02 DIAGNOSIS — S42302D Unspecified fracture of shaft of humerus, left arm, subsequent encounter for fracture with routine healing: Secondary | ICD-10-CM | POA: Diagnosis not present

## 2019-08-02 NOTE — Pre-Procedure Instructions (Signed)
Michaela Simpson  08/02/2019    Your procedure is scheduled on Tuesday, August 07, 2019 at 10:15 AM.   Report to [facility] Entrance "A" Admitting Office at 8:15 AM.   Call this number if you have problems the morning of surgery: [phone]   Questions prior to day of surgery, please call [phone] between 8 & 4 PM.   Remember:  Do not eat or drink after midnight Monday, 08/06/19.  Take these medicines the morning of surgery with A SIP OF WATER: NONE  Stop Multivitamins, Herbal medications and Probiotic as of today prior to surgery. Do not use other Aspirin products (BC Powders, Goody's, etc), NSAIDS (Ibuprofen, Aleve, etc) or Fish Oil prior to surgery.    Do not wear jewelry, make-up or nail polish.  Do not wear lotions, powders, perfumes or deodorant.  Do not shave 48 hours prior to surgery.   Do not bring valuables to the hospital.  [facility] is not responsible for any belongings or valuables.  Contacts, dentures or bridgework may not be worn into surgery.  Leave your suitcase in the car.  After surgery it may be brought to your room.  For patients admitted to the hospital, discharge time will be determined by your treatment team.  [facility] - Preparing for Surgery  Before surgery, you can play an important role.  Because skin is not sterile, your skin needs to be as free of germs as possible.  You can reduce the number of germs on you skin by washing with CHG (chlorahexidine gluconate) soap before surgery.  CHG is an antiseptic cleaner which kills germs and bonds with the skin to continue killing germs even after washing.  Oral Hygiene is also important in reducing the risk of infection.  Remember to brush your teeth with your regular toothpaste the morning of surgery.  Please DO NOT use if you have an allergy to CHG or antibacterial soaps.  If your skin becomes reddened/irritated stop using the CHG and inform your nurse when you arrive at Short  Stay.  Do not shave (including legs and underarms) for at least 48 hours prior to the first CHG shower.  You may shave your face.  Please follow these instructions carefully:   1.  Shower with CHG Soap the night before surgery and the morning of Surgery.  2.  If you choose to wash your hair, wash your hair first as usual with your normal shampoo.  3.  After you shampoo, rinse your hair and body thoroughly to remove the shampoo. 4.  Use CHG as you would any other liquid soap.  You can apply chg directly to the skin and wash gently with a      scrungie or washcloth.           5.  Apply the CHG Soap to your body ONLY FROM THE NECK DOWN.   Do not use on open wounds or open sores. Avoid contact with your eyes, ears, mouth and genitals (private parts).  Wash genitals (private parts) with your normal soap - do this prior to using CHG soap.  6.  Wash thoroughly, paying special attention to the area where your surgery will be performed.  7.  Thoroughly rinse your body with warm water from the neck down.  8.  DO NOT shower/wash with your normal soap after using and rinsing off the CHG Soap.  9.  Pat yourself dry with a clean towel.  10.  Wear clean pajamas.            11.  Place clean sheets on your bed the night of your first shower and do not sleep with pets.  Day of Surgery   Shower as above.  Do not apply any lotions/deodorants the morning of surgery.   Please wear clean clothes to the hospital. Remember to brush your teeth with toothpaste.  Please read over the fact sheets that you were given.

## 2019-08-03 ENCOUNTER — Other Ambulatory Visit (HOSPITAL_COMMUNITY)
Admission: RE | Admit: 2019-08-03 | Discharge: 2019-08-03 | Disposition: A | Discharge status: Home / Self Care | Payer: Medicare Other | Source: Ambulatory Visit | Attending: Cardiovascular Disease | Admitting: Cardiovascular Disease

## 2019-08-03 ENCOUNTER — Other Ambulatory Visit: Payer: Self-pay

## 2019-08-03 ENCOUNTER — Encounter (HOSPITAL_COMMUNITY)
Admission: RE | Admit: 2019-08-03 | Discharge: 2019-08-03 | Disposition: A | Discharge status: Home / Self Care | Payer: Medicare Other | Source: Ambulatory Visit | Attending: Cardiovascular Disease | Admitting: Cardiovascular Disease

## 2019-08-03 ENCOUNTER — Ambulatory Visit: Payer: Medicare Other | Attending: Cardiovascular Disease | Admitting: Physical Therapy

## 2019-08-03 ENCOUNTER — Encounter (HOSPITAL_COMMUNITY): Payer: Self-pay

## 2019-08-03 DIAGNOSIS — Z20822 Contact with and (suspected) exposure to covid-19: Secondary | ICD-10-CM | POA: Insufficient documentation

## 2019-08-03 DIAGNOSIS — I350 Nonrheumatic aortic (valve) stenosis: Secondary | ICD-10-CM | POA: Insufficient documentation

## 2019-08-03 DIAGNOSIS — Z01818 Encounter for other preprocedural examination: Secondary | ICD-10-CM | POA: Insufficient documentation

## 2019-08-03 DIAGNOSIS — J439 Emphysema, unspecified: Secondary | ICD-10-CM | POA: Insufficient documentation

## 2019-08-03 DIAGNOSIS — R262 Difficulty in walking, not elsewhere classified: Secondary | ICD-10-CM | POA: Diagnosis not present

## 2019-08-03 HISTORY — DX: Peripheral vascular disease, unspecified: I73.9

## 2019-08-03 LAB — BLOOD GAS, ARTERIAL
Acid-base deficit: 0.4 mmol/L (ref 0.0–2.0)
Bicarbonate: 23.4 mmol/L (ref 20.0–28.0)
Drawn by: 421801
FIO2: 21
O2 Saturation: 98.8 %
Patient temperature: 37
pCO2 arterial: 36.1 mmHg (ref 32.0–48.0)
pH, Arterial: 7.427 (ref 7.350–7.450)
pO2, Arterial: 128 mmHg — ABNORMAL HIGH (ref 83.0–108.0)

## 2019-08-03 LAB — SARS CORONAVIRUS 2 (TAT 6-24 HRS): SARS Coronavirus 2: NEGATIVE

## 2019-08-03 LAB — TYPE AND SCREEN
ABO/RH(D): O POS
Antibody Screen: NEGATIVE

## 2019-08-03 LAB — SURGICAL PCR SCREEN
MRSA, PCR: NEGATIVE
Staphylococcus aureus: NEGATIVE

## 2019-08-03 LAB — CBC
HCT: 42.5 % (ref 36.0–46.0)
Hemoglobin: 13 g/dL (ref 12.0–15.0)
MCH: 29.2 pg (ref 26.0–34.0)
MCHC: 30.6 g/dL (ref 30.0–36.0)
MCV: 95.5 fL (ref 80.0–100.0)
Platelets: 311 10*3/uL (ref 150–400)
RBC: 4.45 MIL/uL (ref 3.87–5.11)
RDW: 14.4 % (ref 11.5–15.5)
WBC: 9.6 10*3/uL (ref 4.0–10.5)
nRBC: 0 % (ref 0.0–0.2)

## 2019-08-03 LAB — URINALYSIS, ROUTINE W REFLEX MICROSCOPIC
Bilirubin Urine: NEGATIVE
Glucose, UA: NEGATIVE mg/dL
Hgb urine dipstick: NEGATIVE
Ketones, ur: NEGATIVE mg/dL
Leukocytes,Ua: NEGATIVE
Nitrite: NEGATIVE
Protein, ur: NEGATIVE mg/dL
Specific Gravity, Urine: 1.021 (ref 1.005–1.030)
pH: 5 (ref 5.0–8.0)

## 2019-08-03 LAB — COMPREHENSIVE METABOLIC PANEL
ALT: 15 U/L (ref 0–44)
AST: 23 U/L (ref 15–41)
Albumin: 4.1 g/dL (ref 3.5–5.0)
Alkaline Phosphatase: 103 U/L (ref 38–126)
Anion gap: 12 (ref 5–15)
BUN: 35 mg/dL — ABNORMAL HIGH (ref 8–23)
CO2: 20 mmol/L — ABNORMAL LOW (ref 22–32)
Calcium: 9.8 mg/dL (ref 8.9–10.3)
Chloride: 107 mmol/L (ref 98–111)
Creatinine, Ser: 1 mg/dL (ref 0.44–1.00)
GFR calc Af Amer: 59 mL/min — ABNORMAL LOW (ref >60)
GFR calc non Af Amer: 51 mL/min — ABNORMAL LOW (ref >60)
Glucose, Bld: 167 mg/dL — ABNORMAL HIGH (ref 70–99)
Potassium: 4.3 mmol/L (ref 3.5–5.1)
Sodium: 139 mmol/L (ref 135–145)
Total Bilirubin: 0.6 mg/dL (ref 0.3–1.2)
Total Protein: 7.1 g/dL (ref 6.5–8.1)

## 2019-08-03 LAB — HEMOGLOBIN A1C
Hgb A1c MFr Bld: 5.2 % (ref 4.8–5.6)
Mean Plasma Glucose: 102.54 mg/dL

## 2019-08-03 LAB — PROTIME-INR
INR: 1 (ref 0.8–1.2)
Prothrombin Time: 12.6 seconds (ref 11.4–15.2)

## 2019-08-03 LAB — APTT: aPTT: 32 seconds (ref 24–36)

## 2019-08-03 LAB — BRAIN NATRIURETIC PEPTIDE: B Natriuretic Peptide: 132.4 pg/mL — ABNORMAL HIGH (ref 0.0–100.0)

## 2019-08-03 NOTE — Progress Notes (Addendum)
PCP - Dr. Allyn Kenner Cardiologist - Dr. Burt Knack  Chest x-ray - today EKG - 07/19/19 Stress Test - denies ECHO - 03/09/19 Cardiac Cath -  07/19/19  Blood Thinner Instructions: pt to continue Plavix, but does not take DOS Aspirin Instructions: Pt to continue Aspirin, but does not take DOS  COVID TEST- today  Anesthesia review: yes   Reviewed Visitation policy with pt. She voiced understanding but asked that I write it down for her daughters and son to see.  Patient denies shortness of breath, fever, cough and chest pain at PAT appointment   All instructions explained to the patient, with a verbal understanding of the material. Patient agrees to go over the instructions while at home for a better understanding. Patient also instructed to self quarantine after being tested for COVID-19. The opportunity to ask questions was provided.

## 2019-08-03 NOTE — Therapy (Signed)
[place], [name], [address] Phone: [phone]   Fax:  [phone]  Physical Therapy Evaluation/Pre-TAVR  Patient Details  Name: Michaela Simpson MRN: [id number] Date of Birth: [birth year] Referring Provider (PT): Sherren Mocha, MD   Encounter Date: 08/03/2019  PT End of Session - 08/03/19 1112    Visit Number  1    PT Start Time  1106    PT Stop Time  1145    PT Time Calculation (min)  39 min    Activity Tolerance  Patient tolerated treatment well    Behavior During Therapy  [facility] for tasks assessed/performed       Past Medical History:  Diagnosis Date  . AKI (acute kidney injury) ([place])   . Anemia   . Aortic stenosis   . Depression   . Hip fracture ([place])   . HTN (hypertension)   . Humerus fracture   . Leukocytosis   . Osteoporosis   . Rheumatoid arthritis ([place])   . Ulcer of lower extremity [facility])     Past Surgical History:  Procedure Laterality Date  . ABDOMINAL AORTOGRAM W/LOWER EXTREMITY N/A 05/08/2019   Procedure: ABDOMINAL AORTOGRAM W/LOWER EXTREMITY;  Surgeon: Serafina Mitchell, MD;  Location: [place] CV [facility];  Service: Cardiovascular;  Laterality: N/A;  . HIP PINNING,CANNULATED Left 03/09/2019   Procedure: CANNULATED HIP PINNING;  Surgeon: Mcarthur Rossetti, MD;  Location: [place];  Service: Orthopedics;  Laterality: Left;  . INTRAMEDULLARY (IM) NAIL INTERTROCHANTERIC Right 03/14/2015   Procedure: RIGHT INTERTROCHANTRIC INTRAMEDULLARY (IM) NAIL ;  Surgeon: Mcarthur Rossetti, MD;  Location: [place];  Service: Orthopedics;  Laterality: Right;  . PERIPHERAL VASCULAR INTERVENTION Right 05/08/2019   Procedure: PERIPHERAL VASCULAR INTERVENTION;  Surgeon: Serafina Mitchell, MD;  Location: [place] CV [facility];  Service: Cardiovascular;  Laterality: Right;  . RIGHT/LEFT HEART CATH AND CORONARY ANGIOGRAPHY N/A 07/19/2019   Procedure: RIGHT/LEFT HEART CATH AND CORONARY ANGIOGRAPHY;  Surgeon:  Sherren Mocha, MD;  Location: [place] CV [facility];  Service: Cardiovascular;  Laterality: N/A;    There were no vitals filed for this visit.   Subjective Assessment - 08/03/19 1110    Subjective  Left leg is sore today, not hurting. I fell on Sept 17 and they found clots that needed stents. I didn't know I had heart troubles with my heart until they found it at the vascular surgeon.    Currently in Pain?  No/denies         [facility] PT Assessment - 08/03/19 0001      Assessment   Medical Diagnosis  severe aortic stenosis    Referring Provider (PT)  Sherren Mocha, MD    Hand Dominance  Right      Precautions   Precautions  None      Restrictions   Weight Bearing Restrictions  No      Balance Screen   Has the patient fallen in the past 6 months  No      Sensation   Additional Comments  [facility]      Posture/Postural Control   Posture Comments  mild rounded shoulders      ROM / Strength   AROM / PROM / Strength  AROM;Strength      AROM   Overall AROM Comments  Lt UE & hip approx 50% of Rt that is [facility]      Strength   Overall Strength Comments  Rt side gross 5/5, Lt UE gross 4/5, Lt LEgross  4+/5    Strength Assessment Site  Hand    Right/Left hand  Right;Left    Right Hand Grip (lbs)  35    Left Hand Grip (lbs)  30       [facility] Pre-Surgical Assessment - 08/03/19 0001    5 Meter Walk Test- trial 1  5 sec    5 Meter Walk Test- trial 2  5 sec.     5 Meter Walk Test- trial 3  5 sec.    5 meter walk test average  5 sec    4 Stage Balance Test Position  1    Sit To Stand Test- trial 1  22 sec.    6 Minute Walk- Baseline  yes    BP (mmHg)  146/90    HR (bpm)  88    02 Sat (%RA)  99 %    Modified Borg Scale for Dyspnea  0- Nothing at all    Perceived Rate of Exertion (Borg)  6-    6 Minute Walk Post Test  yes    BP (mmHg)  (!) 142/98    HR (bpm)  96    02 Sat (%RA)  97 %    Modified Borg Scale for Dyspnea  3- Moderate shortness of breath or breathing difficulty     Perceived Rate of Exertion (Borg)  10-    Aerobic Endurance Distance Walked  890    Endurance additional comments  31% disability compared to age related norms              Objective measurements completed on examination: See above findings.                PT Short Term Goals - 01/29/19 1157      PT SHORT TERM GOAL #1   Title  PT wound to be 100% granulated to decrease cellulitis    Time  2    Period  Weeks    Status  Achieved      PT SHORT TERM GOAL #2   Title  Pt pain to be no greater than a 4/10 to allow pt to be able to tolerate standing/walking for up  to 30 minutes without increased pain for improved functional ability.     Time  2    Period  Weeks    Status  Achieved      PT SHORT TERM GOAL #3   Title  Patient will recieve silver nitrate to treat inferior wound margins to improve wound approximation    Time  2    Period  Days    Status  Deferred    Target Date  01/12/19        PT Long Term Goals - 01/29/19 1158      PT LONG TERM GOAL #1   Title  Wound size to be decreased to no greater than 1.5x1.5 cm to allow pt to feel comfortable to complete self care for wound.     Time  4    Period  Weeks    Status  Achieved      PT LONG TERM GOAL #2   Title  Pt to be able to vocalize the benefits of wearing compression garments for varicosities.     Time  4    Period  Weeks    Status  On-going      PT LONG TERM GOAL #3   Title  Patient's wounds to fully approximate with no signs of infection or decreased  skin integrity.    Time  6    Period  Weeks    Status  On-going             Plan - 08/03/19 1154    PT Frequency  One time visit    Consulted and Agree with Plan of Care  Patient;Family member/caregiver    Family Member Consulted  daughter       Clinical Impression Statement: Pt is a 86 yo F presenting to OP PT for evaluation prior to possible TAVR surgery due to severe aortic stenosis. Pt denies any symptoms from her heart or  breathing that limit her functional ability.Pt presents with limited ROM and strength on her Left side and has occasional musculoskeletal pain in Left arm and Left leg since a fall that resulted in humeral and femoral fracture. Pt ambulated a total of 890 feet in 6 minute walk and reported 3/10 SOB on modified scale for dyspena and 10/20 RPE on Borg's perceived exertion and pain scale at the end of the walk. During the 6 minute walk test, patient's HR increased to 102 BPM and O2 saturation decreased to 94%. Based on the Short Physical Performance Battery, patient has a frailty rating of 6/12 with </= 5/12 considered frail.  Visit Diagnosis: Difficulty in walking, not elsewhere classified     Problem List Patient Active Problem List   Diagnosis Date Noted  . Severe aortic stenosis 07/19/2019  . Essential hypertension, benign 03/15/2019  . Sequelae of open wound of right lower extremity 03/15/2019  . Major depression, recurrent, chronic ([place]) 03/15/2019  . Chronic constipation 03/15/2019  . Closed left humeral fracture 03/08/2019  . Wound of right leg, initial encounter 03/08/2019  . Leukocytosis 03/08/2019  . Fracture of femoral neck, left, closed ([place]) 03/08/2019  . AKI (acute kidney injury) ([place]) 03/08/2019  . Depression   . HTN (hypertension)   . Rash and nonspecific skin eruption 04/06/2015  . Acute blood loss anemia 03/22/2015  . Edema 03/21/2015  . Acute renal failure syndrome ([place])   . Closed right hip fracture ([place]) 03/14/2015  . Rheumatoid arthritis ([place]) 03/14/2015  . Fall 03/14/2015   Rhayne Chatwin C. Aliany Fiorenza PT, DPT 08/03/19 11:59 AM   [place] [facility] [address] [place], [name], [address] Phone: [phone]   Fax:  [phone]  Name: Michaela Simpson MRN: [id number] Date of Birth: [birth year]

## 2019-08-06 MED ORDER — LEVOFLOXACIN IN D5W 500 MG/100ML IV SOLN
500.0000 mg | INTRAVENOUS | Status: AC
  Administered 2019-08-07: 500 mg via INTRAVENOUS
  Filled 2019-08-06: qty 100

## 2019-08-06 MED ORDER — NOREPINEPHRINE 4 MG/250ML-% IV SOLN
0.0000 ug/min | INTRAVENOUS | Status: AC
  Administered 2019-08-07: 11:00:00 2 ug/min via INTRAVENOUS
  Filled 2019-08-06: qty 250

## 2019-08-06 MED ORDER — SODIUM CHLORIDE 0.9 % IV SOLN
INTRAVENOUS | Status: DC
  Filled 2019-08-06: qty 30

## 2019-08-06 MED ORDER — MAGNESIUM SULFATE 50 % IJ SOLN
40.0000 meq | INTRAMUSCULAR | Status: DC
  Filled 2019-08-06: qty 9.85

## 2019-08-06 MED ORDER — VANCOMYCIN HCL IN DEXTROSE 1-5 GM/200ML-% IV SOLN
1000.0000 mg | INTRAVENOUS | Status: AC
  Administered 2019-08-07: 11:00:00 1000 mg via INTRAVENOUS
  Filled 2019-08-06: qty 200

## 2019-08-06 MED ORDER — POTASSIUM CHLORIDE 2 MEQ/ML IV SOLN
80.0000 meq | INTRAVENOUS | Status: DC
  Filled 2019-08-06: qty 40

## 2019-08-06 MED ORDER — DEXMEDETOMIDINE HCL IN NACL 400 MCG/100ML IV SOLN
0.1000 ug/kg/h | INTRAVENOUS | Status: AC
  Administered 2019-08-07: 47.3 ug via INTRAVENOUS
  Administered 2019-08-07: 11:00:00 .4 ug/kg/h via INTRAVENOUS
  Filled 2019-08-06: qty 100

## 2019-08-07 ENCOUNTER — Other Ambulatory Visit: Payer: Self-pay

## 2019-08-07 ENCOUNTER — Inpatient Hospital Stay (HOSPITAL_COMMUNITY): Payer: Medicare Other

## 2019-08-07 ENCOUNTER — Ambulatory Visit (HOSPITAL_COMMUNITY)
Admission: RE | Admit: 2019-08-07 | Discharge: 2019-08-07 | Disposition: A | Discharge status: Home / Self Care | Payer: Medicare Other | Source: Ambulatory Visit | Attending: Cardiovascular Disease | Admitting: Cardiovascular Disease

## 2019-08-07 ENCOUNTER — Inpatient Hospital Stay (HOSPITAL_COMMUNITY): Payer: Medicare Other | Admitting: Physician Assistant

## 2019-08-07 ENCOUNTER — Inpatient Hospital Stay (HOSPITAL_COMMUNITY): Payer: Medicare Other | Admitting: Certified Registered"

## 2019-08-07 ENCOUNTER — Encounter (HOSPITAL_COMMUNITY): Payer: Self-pay | Admitting: Cardiovascular Disease

## 2019-08-07 ENCOUNTER — Inpatient Hospital Stay (HOSPITAL_COMMUNITY)
Admission: RE | Admit: 2019-08-07 | Discharge: 2019-08-08 | DRG: 267 | Disposition: A | Discharge status: Home / Self Care | Payer: Medicare Other | Attending: Surgery | Admitting: Surgery

## 2019-08-07 ENCOUNTER — Encounter (HOSPITAL_COMMUNITY)
Admission: RE | Disposition: A | Discharge status: Home / Self Care | Payer: Self-pay | Source: Home / Self Care | Attending: Surgery

## 2019-08-07 DIAGNOSIS — Z7982 Long term (current) use of aspirin: Secondary | ICD-10-CM | POA: Diagnosis not present

## 2019-08-07 DIAGNOSIS — I110 Hypertensive heart disease with heart failure: Secondary | ICD-10-CM | POA: Diagnosis not present

## 2019-08-07 DIAGNOSIS — Z880 Allergy status to penicillin: Secondary | ICD-10-CM | POA: Diagnosis not present

## 2019-08-07 DIAGNOSIS — Z91030 Bee allergy status: Secondary | ICD-10-CM

## 2019-08-07 DIAGNOSIS — Z79899 Other long term (current) drug therapy: Secondary | ICD-10-CM

## 2019-08-07 DIAGNOSIS — M069 Rheumatoid arthritis, unspecified: Secondary | ICD-10-CM | POA: Diagnosis present

## 2019-08-07 DIAGNOSIS — Z885 Allergy status to narcotic agent status: Secondary | ICD-10-CM | POA: Diagnosis not present

## 2019-08-07 DIAGNOSIS — Z952 Presence of prosthetic heart valve: Secondary | ICD-10-CM | POA: Diagnosis not present

## 2019-08-07 DIAGNOSIS — Z954 Presence of other heart-valve replacement: Secondary | ICD-10-CM | POA: Diagnosis not present

## 2019-08-07 DIAGNOSIS — Z006 Encounter for examination for normal comparison and control in clinical research program: Secondary | ICD-10-CM

## 2019-08-07 DIAGNOSIS — M25552 Pain in left hip: Secondary | ICD-10-CM | POA: Diagnosis present

## 2019-08-07 DIAGNOSIS — I739 Peripheral vascular disease, unspecified: Secondary | ICD-10-CM | POA: Diagnosis not present

## 2019-08-07 DIAGNOSIS — I350 Nonrheumatic aortic (valve) stenosis: Secondary | ICD-10-CM

## 2019-08-07 DIAGNOSIS — I10 Essential (primary) hypertension: Secondary | ICD-10-CM | POA: Diagnosis not present

## 2019-08-07 DIAGNOSIS — I5032 Chronic diastolic (congestive) heart failure: Secondary | ICD-10-CM | POA: Diagnosis present

## 2019-08-07 DIAGNOSIS — I082 Rheumatic disorders of both aortic and tricuspid valves: Secondary | ICD-10-CM | POA: Insufficient documentation

## 2019-08-07 DIAGNOSIS — M810 Age-related osteoporosis without current pathological fracture: Secondary | ICD-10-CM | POA: Diagnosis not present

## 2019-08-07 DIAGNOSIS — F329 Major depressive disorder, single episode, unspecified: Secondary | ICD-10-CM | POA: Diagnosis present

## 2019-08-07 DIAGNOSIS — I724 Aneurysm of artery of lower extremity: Secondary | ICD-10-CM | POA: Diagnosis present

## 2019-08-07 DIAGNOSIS — N179 Acute kidney failure, unspecified: Secondary | ICD-10-CM | POA: Diagnosis not present

## 2019-08-07 HISTORY — PX: TEE WITHOUT CARDIOVERSION: SHX5443

## 2019-08-07 HISTORY — PX: TRANSCATHETER AORTIC VALVE REPLACEMENT, TRANSFEMORAL: SHX6400

## 2019-08-07 LAB — POCT I-STAT, CHEM 8
BUN: 30 mg/dL — ABNORMAL HIGH (ref 8–23)
BUN: 28 mg/dL — ABNORMAL HIGH (ref 8–23)
BUN: 30 mg/dL — ABNORMAL HIGH (ref 8–23)
BUN: 28 mg/dL — ABNORMAL HIGH (ref 8–23)
Calcium, Ion: 1.27 mmol/L (ref 1.15–1.40)
Calcium, Ion: 1.19 mmol/L (ref 1.15–1.40)
Calcium, Ion: 1.23 mmol/L (ref 1.15–1.40)
Calcium, Ion: 1.23 mmol/L (ref 1.15–1.40)
Chloride: 106 mmol/L (ref 98–111)
Chloride: 107 mmol/L (ref 98–111)
Chloride: 107 mmol/L (ref 98–111)
Chloride: 108 mmol/L (ref 98–111)
Creatinine, Ser: 0.8 mg/dL (ref 0.44–1.00)
Creatinine, Ser: 0.7 mg/dL (ref 0.44–1.00)
Creatinine, Ser: 0.7 mg/dL (ref 0.44–1.00)
Creatinine, Ser: 0.7 mg/dL (ref 0.44–1.00)
Glucose, Bld: 165 mg/dL — ABNORMAL HIGH (ref 70–99)
Glucose, Bld: 113 mg/dL — ABNORMAL HIGH (ref 70–99)
Glucose, Bld: 142 mg/dL — ABNORMAL HIGH (ref 70–99)
Glucose, Bld: 92 mg/dL (ref 70–99)
HCT: 34 % — ABNORMAL LOW (ref 36.0–46.0)
HCT: 30 % — ABNORMAL LOW (ref 36.0–46.0)
HCT: 33 % — ABNORMAL LOW (ref 36.0–46.0)
HCT: 33 % — ABNORMAL LOW (ref 36.0–46.0)
Hemoglobin: 11.6 g/dL — ABNORMAL LOW (ref 12.0–15.0)
Hemoglobin: 10.2 g/dL — ABNORMAL LOW (ref 12.0–15.0)
Hemoglobin: 11.2 g/dL — ABNORMAL LOW (ref 12.0–15.0)
Hemoglobin: 11.2 g/dL — ABNORMAL LOW (ref 12.0–15.0)
Potassium: 4 mmol/L (ref 3.5–5.1)
Potassium: 4.1 mmol/L (ref 3.5–5.1)
Potassium: 4.2 mmol/L (ref 3.5–5.1)
Potassium: 4.2 mmol/L (ref 3.5–5.1)
Sodium: 140 mmol/L (ref 135–145)
Sodium: 138 mmol/L (ref 135–145)
Sodium: 139 mmol/L (ref 135–145)
Sodium: 140 mmol/L (ref 135–145)
TCO2: 26 mmol/L (ref 22–32)
TCO2: 23 mmol/L (ref 22–32)
TCO2: 26 mmol/L (ref 22–32)
TCO2: 23 mmol/L (ref 22–32)

## 2019-08-07 LAB — POCT ACTIVATED CLOTTING TIME
Activated Clotting Time: 144 seconds
Activated Clotting Time: 127 seconds
Activated Clotting Time: 299 seconds

## 2019-08-07 SURGERY — IMPLANTATION, AORTIC VALVE, TRANSCATHETER, FEMORAL APPROACH
Anesthesia: Monitor Anesthesia Care

## 2019-08-07 MED ORDER — METOPROLOL TARTRATE 5 MG/5ML IV SOLN: 2.5000 mg | INTRAVENOUS | Status: DC | PRN

## 2019-08-07 MED ORDER — CHLORHEXIDINE GLUCONATE 0.12 % MT SOLN: 15.0000 mL | Freq: Once | OROMUCOSAL | Status: AC

## 2019-08-07 MED ORDER — SERTRALINE HCL 50 MG PO TABS
150.0000 mg | ORAL_TABLET | Freq: Every day | ORAL | Status: DC
  Administered 2019-08-08: 150 mg via ORAL
  Filled 2019-08-07 (×2): qty 1

## 2019-08-07 MED ORDER — FENTANYL CITRATE (PF) 100 MCG/2ML IJ SOLN
INTRAMUSCULAR | Status: AC
  Filled 2019-08-07: qty 2

## 2019-08-07 MED ORDER — ONDANSETRON HCL 4 MG/2ML IJ SOLN: 4.0000 mg | Freq: Four times a day (QID) | INTRAMUSCULAR | Status: DC | PRN

## 2019-08-07 MED ORDER — SODIUM CHLORIDE 0.9 % IV SOLN
INTRAVENOUS | Status: DC
  Administered 2019-08-07: 10 mL/h via INTRAVENOUS

## 2019-08-07 MED ORDER — SODIUM CHLORIDE 0.9 % IV SOLN: INTRAVENOUS | Status: AC

## 2019-08-07 MED ORDER — CHLORHEXIDINE GLUCONATE 4 % EX LIQD: 30.0000 mL | CUTANEOUS | Status: DC

## 2019-08-07 MED ORDER — CHLORHEXIDINE GLUCONATE 4 % EX LIQD: 60.0000 mL | Freq: Once | CUTANEOUS | Status: DC

## 2019-08-07 MED ORDER — ROSUVASTATIN CALCIUM 5 MG PO TABS
10.0000 mg | ORAL_TABLET | Freq: Every day | ORAL | Status: DC
  Administered 2019-08-07: 10 mg via ORAL
  Filled 2019-08-07: qty 2

## 2019-08-07 MED ORDER — SODIUM CHLORIDE 0.9 % IV SOLN
INTRAVENOUS | Status: DC
  Administered 2019-08-07: 75 mL/h via INTRAVENOUS

## 2019-08-07 MED ORDER — FENTANYL CITRATE (PF) 100 MCG/2ML IJ SOLN
INTRAMUSCULAR | Status: DC | PRN
  Administered 2019-08-07: 50 ug via INTRAVENOUS

## 2019-08-07 MED ORDER — SODIUM CHLORIDE 0.9 % IV SOLN: 250.0000 mL | INTRAVENOUS | Status: DC | PRN

## 2019-08-07 MED ORDER — PHENYLEPHRINE HCL-NACL 20-0.9 MG/250ML-% IV SOLN
0.0000 ug/min | INTRAVENOUS | Status: DC
  Filled 2019-08-07: qty 250

## 2019-08-07 MED ORDER — IOHEXOL 350 MG/ML SOLN
INTRAVENOUS | Status: AC
  Filled 2019-08-07: qty 1

## 2019-08-07 MED ORDER — PROPOFOL 500 MG/50ML IV EMUL
INTRAVENOUS | Status: DC | PRN
  Administered 2019-08-07: 25 ug/kg/min via INTRAVENOUS

## 2019-08-07 MED ORDER — ACETAMINOPHEN 325 MG PO TABS
ORAL_TABLET | ORAL | Status: AC
  Filled 2019-08-07: qty 2

## 2019-08-07 MED ORDER — LACTATED RINGERS IV SOLN: INTRAVENOUS | Status: DC | PRN

## 2019-08-07 MED ORDER — IOHEXOL 350 MG/ML SOLN
INTRAVENOUS | Status: DC | PRN
  Administered 2019-08-07: 60 mL

## 2019-08-07 MED ORDER — SODIUM CHLORIDE 0.9% FLUSH
3.0000 mL | Freq: Two times a day (BID) | INTRAVENOUS | Status: DC
  Administered 2019-08-07 – 2019-08-08 (×2): 3 mL via INTRAVENOUS

## 2019-08-07 MED ORDER — HEPARIN SODIUM (PORCINE) 1000 UNIT/ML IJ SOLN
INTRAMUSCULAR | Status: DC | PRN
  Administered 2019-08-07: 7000 [IU] via INTRAVENOUS

## 2019-08-07 MED ORDER — ASPIRIN EC 81 MG PO TBEC
81.0000 mg | DELAYED_RELEASE_TABLET | Freq: Every day | ORAL | Status: DC
  Administered 2019-08-08: 81 mg via ORAL
  Filled 2019-08-07: qty 1

## 2019-08-07 MED ORDER — HEPARIN (PORCINE) IN NACL 1000-0.9 UT/500ML-% IV SOLN
INTRAVENOUS | Status: DC | PRN
  Administered 2019-08-07 (×3): 500 mL

## 2019-08-07 MED ORDER — SODIUM CHLORIDE 0.9% FLUSH: 3.0000 mL | INTRAVENOUS | Status: DC | PRN

## 2019-08-07 MED ORDER — ONDANSETRON HCL 4 MG/2ML IJ SOLN
INTRAMUSCULAR | Status: DC | PRN
  Administered 2019-08-07: 4 mg via INTRAVENOUS

## 2019-08-07 MED ORDER — AMLODIPINE BESYLATE 5 MG PO TABS
2.5000 mg | ORAL_TABLET | Freq: Every day | ORAL | Status: DC
  Administered 2019-08-07: 2.5 mg via ORAL
  Filled 2019-08-07: qty 1

## 2019-08-07 MED ORDER — ACETAMINOPHEN 325 MG PO TABS
650.0000 mg | ORAL_TABLET | Freq: Four times a day (QID) | ORAL | Status: DC | PRN
  Administered 2019-08-07 – 2019-08-08 (×3): 650 mg via ORAL
  Filled 2019-08-07 (×2): qty 2

## 2019-08-07 MED ORDER — NITROGLYCERIN IN D5W 200-5 MCG/ML-% IV SOLN: 0.0000 ug/min | INTRAVENOUS | Status: DC

## 2019-08-07 MED ORDER — LEVOFLOXACIN IN D5W 750 MG/150ML IV SOLN
750.0000 mg | INTRAVENOUS | Status: AC
  Administered 2019-08-08: 750 mg via INTRAVENOUS
  Filled 2019-08-07: qty 150

## 2019-08-07 MED ORDER — ACETAMINOPHEN 650 MG RE SUPP: 650.0000 mg | Freq: Four times a day (QID) | RECTAL | Status: DC | PRN

## 2019-08-07 MED ORDER — CLOPIDOGREL BISULFATE 75 MG PO TABS
75.0000 mg | ORAL_TABLET | Freq: Every day | ORAL | Status: DC
  Administered 2019-08-08: 75 mg via ORAL
  Filled 2019-08-07: qty 1

## 2019-08-07 MED ORDER — LIDOCAINE HCL (PF) 1 % IJ SOLN
INTRAMUSCULAR | Status: DC | PRN
  Administered 2019-08-07: 20 mL

## 2019-08-07 MED ORDER — VANCOMYCIN HCL IN DEXTROSE 1-5 GM/200ML-% IV SOLN
1000.0000 mg | Freq: Once | INTRAVENOUS | Status: AC
  Administered 2019-08-07: 1000 mg via INTRAVENOUS
  Filled 2019-08-07 (×2): qty 200

## 2019-08-07 MED ORDER — PROTAMINE SULFATE 10 MG/ML IV SOLN
INTRAVENOUS | Status: DC | PRN
  Administered 2019-08-07: 70 mg via INTRAVENOUS

## 2019-08-07 MED ORDER — CHLORHEXIDINE GLUCONATE 0.12 % MT SOLN
OROMUCOSAL | Status: AC
  Administered 2019-08-07: 15 mL via OROMUCOSAL
  Filled 2019-08-07: qty 15

## 2019-08-07 MED ORDER — LORAZEPAM 0.5 MG PO TABS
0.5000 mg | ORAL_TABLET | Freq: Every day | ORAL | Status: DC
  Administered 2019-08-07: 0.5 mg via ORAL
  Filled 2019-08-07: qty 1

## 2019-08-07 SURGICAL SUPPLY — 33 items
BAG SNAP BAND KOVER 36X36 (MISCELLANEOUS) ×6 IMPLANT
BLANKET WARM UNDERBOD FULL ACC (MISCELLANEOUS) ×3 IMPLANT
CABLE ADAPT PACING TEMP 12FT (ADAPTER) ×3 IMPLANT
CATH 23 ULTRA DELIVERY (CATHETERS) ×3 IMPLANT
CATH DIAG 6FR PIGTAIL ANGLED (CATHETERS) ×6 IMPLANT
CATH INFINITI 6F AL1 (CATHETERS) ×3 IMPLANT
CATH INFINITI 6F AL2 (CATHETERS) ×3 IMPLANT
CATH S G BIP PACING (CATHETERS) ×3 IMPLANT
CLOSURE MYNX CONTROL 6F/7F (Vascular Products) IMPLANT
CRIMPER (MISCELLANEOUS) ×3 IMPLANT
DEVICE CLOSURE PERCLS PRGLD 6F (VASCULAR PRODUCTS) ×3 IMPLANT
DEVICE INFLATION ATRION QL2530 (MISCELLANEOUS) ×3 IMPLANT
ELECT DEFIB PAD ADLT CADENCE (PAD) ×3 IMPLANT
GUIDEWIRE SAFE TJ AMPLATZ EXST (WIRE) ×3 IMPLANT
KIT HEART LEFT (KITS) ×3 IMPLANT
KIT MICROPUNCTURE NIT STIFF (SHEATH) ×3 IMPLANT
PACK CARDIAC CATHETERIZATION (CUSTOM PROCEDURE TRAY) ×3 IMPLANT
PERCLOSE PROGLIDE 6F (VASCULAR PRODUCTS) ×9
SHEATH 14X36 EDWARDS (SHEATH) ×3 IMPLANT
SHEATH BRITE TIP 7FR 35CM (SHEATH) ×3 IMPLANT
SHEATH PINNACLE 6F 10CM (SHEATH) ×3 IMPLANT
SHEATH PINNACLE 8F 10CM (SHEATH) ×3 IMPLANT
SHEATH PROBE COVER 6X72 (BAG) ×3 IMPLANT
SHIELD RADPAD SCOOP 12X17 (MISCELLANEOUS) ×3 IMPLANT
SLEEVE REPOSITIONING LENGTH 30 (MISCELLANEOUS) ×3 IMPLANT
STOPCOCK MORSE 400PSI 3WAY (MISCELLANEOUS) ×6 IMPLANT
TRANSDUCER W/STOPCOCK (MISCELLANEOUS) ×6 IMPLANT
TUBE CONN 8.8X1320 FR HP M-F (CONNECTOR) ×3 IMPLANT
VALVE 23 ULTRA SAPIEN KIT (Valve) ×3 IMPLANT
WIRE AMPLATZ SS-J .035X180CM (WIRE) ×3 IMPLANT
WIRE EMERALD 3MM-J .035X150CM (WIRE) ×3 IMPLANT
WIRE EMERALD 3MM-J .035X260CM (WIRE) ×3 IMPLANT
WIRE EMERALD ST .035X260CM (WIRE) ×3 IMPLANT

## 2019-08-07 NOTE — Anesthesia Procedure Notes (Signed)
Arterial Line Insertion Start/End2/16/2021 9:15 AM, 08/07/2019 9:36 AM Performed by: Myrtie Soman, MD, anesthesiologist  Preanesthetic checklist: patient identified, IV checked, risks and benefits discussed, surgical consent and monitors and equipment checked Lidocaine 1% used for infiltration and patient sedated Right, radial was placed Catheter size: 20 G Maximum sterile barriers used  and Seldinger technique used  Attempts: 2 Procedure performed using ultrasound guided technique. Ultrasound Notes:anatomy identified, needle tip was noted to be adjacent to the nerve/plexus identified, no ultrasound evidence of intravascular and/or intraneural injection and image(s) printed for medical record Following insertion, dressing applied and Biopatch. Post procedure assessment: normal  Patient tolerated the procedure well with no immediate complications. Additional procedure comments: Attempt x 2 - Alano Blasco CRNA ultrasound guided  Kalman Shan MD.

## 2019-08-07 NOTE — H&P (Signed)
[address]       [place] [address]             [phone]      Cardiothoracic Surgery Admission History and Physical   Referring Provider is Ghimire, Henreitta Leber, MD  Primary Cardiologist is No primary care provider on file.  PCP is Celene Squibb, MD      Chief Complaint  Patient presents with  . Aortic Stenosis       HPI:  The patient is an 86 year old woman with a history of hypertension, rheumatoid arthritis, peripheral arterial disease status post right SFA stenting in 04/2019 to heal the right lower leg ulcer, and aortic stenosis. She was hospitalized in September 2020 after a fall resulting in a left hip fracture and left humerus fracture. She underwent ORIF of the hip fracture and nonoperative treatment of her left humeral fracture. During that hospitalization she was noted to have a heart murmur and echocardiogram showed severe aortic stenosis. She was seen by Dr. Burt Knack for evaluation in October 2020 and felt to be a reasonable candidate for TAVR but it was felt that she needed more time to recover from her recent trauma. She is here today with her daughter. She has been walking with a walker or cane. She still has significant pain in her left hip and thigh which has limited her mobility. She said that she may need further surgery on the left hip. She denies any shortness of breath or chest pain. She denies orthopnea. She has had no dizziness or syncope. She has had no lower extremity edema. Her daughter does report that she has been fatigued and short of breath with activity over the past year and had to rest when they went for walks but thought it was probably because she was getting older.  The patient has been widowed for about 10 years. She lives alone. She has a son and daughter live in [place] and a daughter lives in [place].      Past Medical History:  Diagnosis Date  . AKI (acute kidney injury) ([place])   . Anemia   . Aortic stenosis   . Depression    . Hip fracture ([place])   . HTN (hypertension)   . Humerus fracture   . Leukocytosis   . Osteoporosis   . Rheumatoid arthritis ([place])   . Ulcer of lower extremity [facility])         Past Surgical History:  Procedure Laterality Date  . ABDOMINAL AORTOGRAM W/LOWER EXTREMITY N/A 05/08/2019   Procedure: ABDOMINAL AORTOGRAM W/LOWER EXTREMITY; Surgeon: Serafina Mitchell, MD; Location: [facility]; Service: Cardiovascular; Laterality: N/A;  . HIP PINNING,CANNULATED Left 03/09/2019   Procedure: CANNULATED HIP PINNING; Surgeon: Mcarthur Rossetti, MD; Location: [place]; Service: Orthopedics; Laterality: Left;  . INTRAMEDULLARY (IM) NAIL INTERTROCHANTERIC Right 03/14/2015   Procedure: RIGHT INTERTROCHANTRIC INTRAMEDULLARY (IM) NAIL ; Surgeon: Mcarthur Rossetti, MD; Location: [facility]; Service: Orthopedics; Laterality: Right;  . PERIPHERAL VASCULAR INTERVENTION Right 05/08/2019   Procedure: PERIPHERAL VASCULAR INTERVENTION; Surgeon: Serafina Mitchell, MD; Location: [facility]; Service: Cardiovascular; Laterality: Right;  . RIGHT/LEFT HEART CATH AND CORONARY ANGIOGRAPHY N/A 07/19/2019   Procedure: RIGHT/LEFT HEART CATH AND CORONARY ANGIOGRAPHY; Surgeon: Sherren Mocha, MD; Location: [facility]; Service: Cardiovascular; Laterality: N/A;        Family History  Problem Relation Age of Onset  . Cancer Sister    Social History  Socioeconomic History  . Marital status: Widowed    Spouse name: Not on file  . Number of children: Not on file  . Years of education: Not on file  . Highest education level: Not on file  Occupational History  . Not on file  Tobacco Use  . Smoking status: Never Smoker  . Smokeless tobacco: Never Used  Substance and Sexual Activity  . Alcohol use: No  . Drug use: No  . Sexual activity: Not on file  Other Topics Concern  . Not on file  Social History Narrative  . Not on file   Social Determinants of Health      Financial Resource  Strain: Low Risk   . Difficulty of Paying Living Expenses: Not hard at all  Food Insecurity: No Food Insecurity  . Worried About Charity fundraiser in the Last Year: Never true  . Ran Out of Food in the Last Year: Never true  Transportation Needs: No Transportation Needs  . Lack of Transportation (Medical): No  . Lack of Transportation (Non-Medical): No  Physical Activity: Unknown  . Days of Exercise per Week: Patient refused  . Minutes of Exercise per Session: Patient refused  Stress: No Stress Concern Present  . Feeling of Stress : Not at all  Social Connections: Unknown  . Frequency of Communication with Friends and Family: Patient refused  . Frequency of Social Gatherings with Friends and Family: Patient refused  . Attends Religious Services: Patient refused  . Active Member of Clubs or Organizations: Patient refused  . Attends Archivist Meetings: Patient refused  . Marital Status: Patient refused  Intimate Partner Violence: Unknown  . Fear of Current or Ex-Partner: Patient refused  . Emotionally Abused: Patient refused  . Physically Abused: Patient refused  . Sexually Abused: Patient refused         Current Outpatient Medications  Medication Sig Dispense Refill  . acetaminophen (TYLENOL) 325 MG tablet Take 2 tablets (650 mg total) by mouth every 6 (six) hours as needed for mild pain (or Fever >/= 101).    Marland Kitchen amLODipine (NORVASC) 2.5 MG tablet Take 1 tablet (2.5 mg total) by mouth at bedtime. 30 tablet 0  . aspirin EC 81 MG tablet Take 81 mg by mouth daily.    . [place]-Glucosamine-Vit D (OSTEO BI-FLEX ONE PER DAY PO) Take 1 tablet by mouth daily.    . clopidogrel (PLAVIX) 75 MG tablet Take 1 tablet (75 mg total) by mouth daily. 30 tablet 11  . Lactobacillus (PROBIOTIC ACIDOPHILUS PO) Take 1 tablet by mouth daily.    Marland Kitchen lactose free nutrition (BOOST) LIQD Take 237 mLs by mouth 4 (four) times daily.    Marland Kitchen LORazepam (ATIVAN) 0.5 MG tablet Take 0.5 mg by mouth at  bedtime.     . Misc Natural Products (OSTEO BI-FLEX JOINT SHIELD PO) Take 1 tablet by mouth daily.     . Multiple Vitamins-Minerals (MULTIVITAMIN WITH MINERALS) tablet Take 1 tablet by mouth daily.    . Polyethylene Glycol 3350 (MIRALAX PO) Take 17 g by mouth daily as needed (constipation.).     Marland Kitchen rosuvastatin (CRESTOR) 10 MG tablet Take 1 tablet (10 mg total) by mouth at bedtime. 30 tablet 11  . sertraline (ZOLOFT) 100 MG tablet Take 100 mg by mouth daily.    . vitamin B-12 (CYANOCOBALAMIN) 1000 MCG tablet Take 1,000 mcg by mouth daily.     No current facility-administered medications for this visit.  Allergies  Allergen Reactions  . Bee Pollen   . Penicillins Nausea And Vomiting and Other (See Comments)    Child hood allergy - Flu symptoms; constant vomiting  Did it involve swelling of the face/tongue/throat, SOB, or low BP? Unknown  Did it involve sudden or severe rash/hives, skin peeling, or any reaction on the inside of your mouth or nose? Unknown  Did you need to seek medical attention at a hospital or doctor's office? Unknown  When did it last happen?  If all above answers are "NO", may proceed with cephalosporin use.   . Codeine Nausea And Vomiting and Rash   Review of Systems:   General: normal appetite, + decreased energy, no weight gain, no weight loss, no fever  Cardiac: no chest pain with exertion, no chest pain at rest, no SOB with exertion, no resting SOB, no PND, no orthopnea, no palpitations, no arrhythmia, no atrial fibrillation, no LE edema, no dizzy spells, no syncope  Respiratory: no shortness of breath, no home oxygen, no productive cough, no dry cough, no bronchitis, no wheezing, no hemoptysis, no asthma, no pain with inspiration or cough, no sleep apnea, no CPAP at night  GI: no difficulty swallowing, no reflux, no frequent heartburn, no hiatal hernia, no abdominal pain, no constipation, no diarrhea, no hematochezia, no hematemesis, no melena  GU: no  dysuria, no frequency, no urinary tract infection, no hematuria, no kidney stones, no kidney disease  Vascular: no pain suggestive of claudication, no pain in feet, no leg cramps, no varicose veins, no DVT, no non-healing foot ulcer  Neuro: no stroke, no TIA's, no seizures, no headaches, no temporary blindness one eye, no slurred speech, no peripheral neuropathy, + chronic left hip pain, + instability of gait, no memory/cognitive dysfunction  Musculoskeletal: + arthritis, no joint swelling, no myalgias, no difficulty walking, + reduced mobility  Skin: no rash, no itching, no skin infections, no pressure sores or ulcerations  Psych: no anxiety, no depression, no nervousness, no unusual recent stress  Eyes: no blurry vision, no floaters, no recent vision changes, + wears glasses or contacts  ENT: no hearing loss, no loose or painful teeth, no dentures, last saw dentist prior to Covid  Hematologic: no easy bruising, no abnormal bleeding, no clotting disorder, no frequent epistaxis  Endocrine: no diabetes, does not check CBG's at home    Physical Exam:   BP 134/76 (BP Location: Left Arm, Patient Position: Sitting, Cuff Size: Normal)  Pulse 80  Temp 97.9 F (36.6 C)  Resp 16  Ht 5' (1.524 m)  Wt 104 lb 9.6 oz (47.4 kg)  HC 62" (157.5 cm)  SpO2 98% Comment: RA  BMI 20.43 kg/m  General: Elderly but well-appearing  HEENT: Unremarkable, NCAT, PERLA, EOMI  Neck: no JVD, no bruits, no adenopathy  Chest: clear to auscultation, symmetrical breath sounds, no wheezes, no rhonchi  CV: RRR, grade lll/VI crescendo/decrescendo murmur heard best at RSB, no diastolic murmur  Abdomen: soft, non-tender, no masses  Extremities: warm, well-perfused, pulses palpable, no LE edema, dressing right lower leg which she says she uses to protect the previous ulcer which is now healed.  Rectal/GU Deferred  Neuro: Grossly non-focal and symmetrical throughout  Skin: Clean and dry, no rashes, no breakdown     Diagnostic Tests:   ECHOCARDIOGRAM REPORT     Patient Name: ANIYLAH LARAMIE Date of Exam: 03/09/2019  Medical Rec #: [id number] Height: 62.0 in  Accession #: [id number] Weight: 113.3 lb  Date of Birth: August 23, 1931  BSA: 1.50 m  Patient Age: 53 years BP: 146/76 mmHg  Patient Gender: F HR: 75 bpm.  Exam Location: Inpatient   Procedure: 2D Echo   Indications: murmur 785.2   History: Patient has no prior history of Echocardiogram  examinations.   Sonographer: Johny Chess  Referring Phys: [place]    1. Left ventricular ejection fraction, by visual estimation, is 65 to  70%. The left ventricle has normal function. Normal left ventricular size.  There is no left ventricular hypertrophy.  2. Global right ventricle has normal systolic function.The right  ventricular size is normal. No increase in right ventricular wall  thickness.  3. Left atrial size was normal.  4. Right atrial size was normal.  5. The mitral valve is normal in structure. No evidence of mitral valve  regurgitation. No evidence of mitral stenosis.  6. The tricuspid valve is normal in structure. Tricuspid valve  regurgitation was not visualized by color flow Doppler.  7. Aortic valve mean gradient measures 54.0 mmHg.  8. Aortic valve peak gradient measures 84.6 mmHg.  9. The aortic valve is tricuspid with severely calcified leaflets. Aortic  valve regurgitation was not visualized by color flow Doppler. Severe  aortic valve stenosis.  10. The pulmonic valve was normal in structure. Pulmonic valve  regurgitation is not visualized by color flow Doppler.  11. The inferior vena cava is normal in size with greater than 50%  respiratory variability, suggesting right atrial pressure of 3 mmHg.  12. Left ventricular diastolic Doppler parameters are consistent with  impaired relaxation pattern of LV diastolic filling.  13. Elevated left ventricular end-diastolic pressure.  14.  Moderate mitral annular calcification.   FINDINGS  Left Ventricle: Left ventricular ejection fraction, by visual estimation,  is 65 to 70%. The left ventricle has normal function. There is no left  ventricular hypertrophy. Normal left ventricular size. Spectral Doppler  shows Left ventricular diastolic  Doppler parameters are consistent with impaired relaxation pattern of LV  diastolic filling. Elevated left ventricular end-diastolic pressure.   Right Ventricle: The right ventricular size is normal. No increase in  right ventricular wall thickness. Global RV systolic function is has  normal systolic function.   Left Atrium: Left atrial size was normal in size.   Right Atrium: Right atrial size was normal in size   Pericardium: There is no evidence of pericardial effusion.   Mitral Valve: The mitral valve is normal in structure. There is mild  thickening of the mitral valve leaflet(s). There is mild calcification of  the mitral valve leaflet(s). Moderate mitral annular calcification. No  evidence of mitral valve stenosis by  observation. No evidence of mitral valve regurgitation.   Tricuspid Valve: The tricuspid valve is normal in structure. Tricuspid  valve regurgitation was not visualized by color flow Doppler.   Aortic Valve: The aortic valve is tricuspid. Aortic valve regurgitation  was not visualized by color flow Doppler. Severe aortic stenosis is  present. Aortic valve mean gradient measures 54.0 mmHg. Aortic valve peak  gradient measures 84.6 mmHg. Aortic  valve area, by VTI measures 0.61 cm.   Pulmonic Valve: The pulmonic valve was normal in structure. Pulmonic valve  regurgitation is not visualized by color flow Doppler.   Aorta: The aortic root, ascending aorta and aortic arch are all  structurally normal, with no evidence of dilitation or obstruction.   Venous: The inferior vena cava is normal in size with greater than 50%  respiratory variability, suggesting  right atrial pressure of 3 mmHg.   IAS/Shunts: No atrial level shunt detected by color flow Doppler. No  ventricular septal defect is seen or detected. There is no evidence of an  atrial septal defect.     LEFT VENTRICLE Normals  PLAX 2D  LVIDd: 4.00 cm 3.6 cm Diastology Normals  LVIDs: 2.80 cm 1.7 cm LV e' lateral: 7.07 cm/s 6.42 cm/s  LV PW: 0.70 cm 1.4 cm LV E/e' lateral: 11.4 15.4  LV IVS: 0.70 cm 1.3 cm LV e' medial: 4.68 cm/s 6.96 cm/s  LVOT diam: 1.90 cm 2.0 cm LV E/e' medial: 17.2 6.96  LV SV: 40 ml 79 ml  LV SV Index: 26.96 45 ml/m2  LVOT Area: 2.84 cm 3.14 cm2    RIGHT VENTRICLE  RV S prime: 13.30 cm/s  TAPSE (M-mode): 1.9 cm   LEFT ATRIUM Index RIGHT ATRIUM Index  LA diam: 2.50 cm 1.66 cm/m RA Area: 9.77 cm  LA Vol (A2C): 31.6 ml 21.04 ml/m RA Volume: 19.70 ml 13.12 ml/m  LA Vol (A4C): 26.6 ml 17.71 ml/m  LA Biplane Vol: 29.8 ml 19.85 ml/m  AORTIC VALVE Normals  AV Area (Vmax): 0.59 cm  AV Area (Vmean): 0.57 cm 3.06 cm2  AV Area (VTI): 0.61 cm  AV Vmax: 460.00 cm/s  AV Vmean: 348.500 cm/s 77 cm/s  AV VTI: 1.110 m 3.15 cm2  AV Peak Grad: 84.6 mmHg  AV Mean Grad: 54.0 mmHg 3 mmHg  LVOT Vmax: 96.10 cm/s  LVOT Vmean: 70.100 cm/s 75 cm/s  LVOT VTI: 0.239 m 25.3 cm  LVOT/AV VTI ratio: 0.22 1   AORTA Normals  Ao Root diam: 3.20 cm 31 mm   MV E velocity: 80.50 cm/s 103 cm/s  MV A velocity: 121.00 cm/s 70.3 cm/s SHUNTS  MV E/A ratio: 0.67 1.5 Systemic VTI: 0.24 m  Systemic Diam: 1.90 cm    Ena Dawley MD  Electronically signed by Ena Dawley MD  Signature Date/Time: 03/09/2019/4:55:51 PM    Physicians  Panel Physicians Referring Physician Case Authorizing Physician  Sherren Mocha, MD (Primary)       Procedures  RIGHT/LEFT HEART CATH AND CORONARY ANGIOGRAPHY     Conclusion  1. Severe single vessel CAD involving a moderate caliber (2 mm) first diagonal branch of the LAD, otherwise widely patent left main, LAD, LCx, and RCA  2.  Severe calcific AS with mean gradient 50 mmHg and AVA 0.56 square cm  3. Normal right heart pressures  Plan: continue TAVR evaluation. Medical therapy for CAD (branch vessel disease, pt without angina)           Indications  Severe aortic stenosis [I35.0 (ICD-10-CM)]     Procedural Details  Technical Details INDICATION: Severe, Stage D1 aortic stenosis  PROCEDURAL DETAILS: The right groin is prepped, draped, and anesthetized with 1% lidocaine. Using direct ultrasound guidance a 5 French sheath is placed in the right femoral artery and a 7 French sheath is placed in the right femoral vein. [place] images are captured and stored in the patient's chart. A Swan-Ganz catheter is used for the right heart catheterization. Standard protocol is followed for recording of right heart pressures and sampling of oxygen saturations. Fick cardiac output is calculated. Standard Judkins catheters are used for selective coronary angiography. LV pressure is recorded and an aortic valve pullback gradient is recorded. The aortic valve is crossed with an AL-2 catheter and straight wire. Mynx closure is used for femoral venous and arterial hemostasis. There are no immediate procedural complications. The  patient is transferred to the post catheterization recovery area for further monitoring.    Estimated blood loss <50 mL.   During this procedure medications were administered to achieve and maintain moderate conscious sedation while the patient's heart rate, blood pressure, and oxygen saturation were continuously monitored and I was present face-to-face 100% of this time.     Medications  (Filter: Administrations occurring from 07/19/19 1108 to 07/19/19 1205)  Continuous medications are totaled by the amount administered until 07/19/19 1205.  midazolam (VERSED) injection (mg)  Total dose: 1 mg  Date/Time   Rate/Dose/Volume Action  07/19/19 1125  1 mg Given  lidocaine (PF) (XYLOCAINE) 1 % injection (mL)  Total  volume: 12 mL  Date/Time   Rate/Dose/Volume Action  07/19/19 1129  12 mL Given  Heparin (Porcine) in NaCl 1000-0.9 UT/500ML-% SOLN (mL)  Total volume: 1,000 mL  Date/Time   Rate/Dose/Volume Action  07/19/19 1129  500 mL Given  1129  500 mL Given  iohexol (OMNIPAQUE) 350 MG/ML injection (mL)  Total volume: 45 mL  Date/Time   Rate/Dose/Volume Action  07/19/19 1201  45 mL Given  Heparin (Porcine) in NaCl 1000-0.9 UT/500ML-% SOLN (mL)  Total volume: 500 mL  Date/Time   Rate/Dose/Volume Action  07/19/19 1139  500 mL Given     Sedation Time  Sedation Time Physician-1: 30 minutes 38 seconds        Contrast  Medication Name Total Dose  iohexol (OMNIPAQUE) 350 MG/ML injection 45 mL     Radiation/Fluoro  Fluoro time: 6.5 (min)  DAP: 8538 (mGycm2)  Cumulative Air Kerma: 138 (mGy)        Coronary Findings  Diagnostic  Dominance: Right  Left Main  Vessel is angiographically normal.   Left Anterior Descending  The vessel exhibits minimal luminal irregularities.   First Diagonal Branch  1st Diag lesion 80% stenosed  1st Diag lesion is 80% stenosed.   Left Circumflex  The vessel exhibits minimal luminal irregularities.   Right Coronary Artery  Vessel is angiographically normal.  Intervention  No interventions have been documented.                         Left Heart  Aortic Valve There is severe aortic valve stenosis. The aortic valve is calcified. There is restricted aortic valve motion. Severe aortic stenosis with peak to peak gradient 69 mmHg, mean gradient 50 mmhg, and calculated AVA 0.56 square cm     Coronary Diagrams  Diagnostic  Dominance: Right  &&&&&  Intervention       Implants                     Vascular Products  Closure Mynx Control 54f/5f [id number] - Implanted  Inventory item: CLOSURE [facility] CONTROL 62F/43F Model/Cat number: [id number]  Manufacturer: [place] Lot number: [id number]  Device identifier: [id number]  Device identifier type: GS1  GUDID Information   Request status Successful     Brand name: [facility] CONTROL Version/Model: [id number]   Company name: [place] safety info as of 07/19/19: MR Safe   Contains dry or latex rubber: No     GMDN P.T. name: Wound hydrogel dressing, non-antimicrobial     As of 07/19/2019    Status: Implanted      Closure Mynx Control 28f - [id number] - Implanted  Inventory item: CLOSURE [facility] CONTROL 55F Model/Cat number: [id number]  Manufacturer: [place] Lot number: [id number]  Device identifier: [id number] Device identifier type: GS1  GUDID Information   Request status Successful     Brand name: MYNX CONTROL Version/Model: [id number]   Company name: [place] safety info as of 07/19/19: MR Safe   Contains dry or latex rubber: No     GMDN P.T. name: Wound hydrogel dressing, non-antimicrobial     As of 07/19/2019    Status: Implanted                         [employer] Images Link to Procedure Log  Show images for CARDIAC CATHETERIZATION Procedure Log     Images on Long Term Storage   Show images for Nitzia, Haling P "Bea"            Hemo Data    Most Recent Value  Fick Cardiac Output 4.66 L/min  Fick Cardiac Output Index 3.22 (L/min)/BSA  Aortic Mean Gradient 50.22 mmHg  Aortic Peak Gradient 69 mmHg  Aortic Valve Area 0.56  Aortic Value Area Index 0.38 cm2/BSA  RA A Wave 3 mmHg  RA V Wave 1 mmHg  RA Mean 0 mmHg  RV Systolic Pressure 25 mmHg  RV Diastolic Pressure -3 mmHg  RV EDP 0 mmHg  PA Systolic Pressure 22 mmHg  PA Diastolic Pressure 1 mmHg  PA Mean 14 mmHg  PW A Wave 8 mmHg  PW V Wave 7 mmHg  PW Mean 6 mmHg  AO Systolic Pressure 0000000 mmHg  AO Diastolic Pressure 58 mmHg  AO Mean 90 mmHg  LV Systolic Pressure 99991111 mmHg  LV Diastolic Pressure 3 mmHg  LV EDP 11 mmHg  AOp Systolic Pressure 123456 mmHg  AOp Diastolic Pressure 62 mmHg  AOp Mean Pressure 96 mmHg  LVp Systolic Pressure 123456 mmHg  LVp Diastolic  Pressure 3 mmHg  LVp EDP Pressure 8 mmHg  QP/QS 1  TPVR Index 4.34 HRUI  TSVR Index 27.91 HRUI  TPVR/TSVR Ratio 0.16     ADDENDUM REPORT: 07/26/2019 22:12  CLINICAL DATA: 86 year old female with severe aortic stenosis being  evaluated for a TAVR procedure.  EXAM:  Cardiac TAVR CT  TECHNIQUE:  The patient was scanned on a [employer]. A 120 kV  retrospective scan was triggered in the descending thoracic aorta at  111 HU's. Gantry rotation speed was 250 msecs and collimation was .6  mm. No beta blockade or nitro were given. The 3D data set was  reconstructed in 5% intervals of the R-R cycle. Systolic and  diastolic phases were analyzed on a dedicated work station using  MPR, MIP and VRT modes. The patient received 80 cc of contrast.  FINDINGS:  Aortic Valve: Trileaflet aortic valve with severely calcified  leaflets and moderately restricted leaflet opening, no  calcifications are extending into the LVOT.  Aorta: There is mild ascending aortic aneurysm with maximum diameter  43 mm.  Sinotubular Junction: 31 x 30 mm  Ascending Thoracic Aorta: 43 x 43 mm  Aortic Arch: 25 x 25 mm  Descending Thoracic Aorta: 25 x 24 mm  Sinus of Valsalva Measurements:  Non-coronary: 32 mm  Right -coronary: 32 mm  Left -coronary: 30 mm  Coronary Artery Height above Annulus:  Left Main: 12 mm  Right Coronary: 13 mm  Virtual Basal Annulus Measurements:  Maximum/Minimum Diameter: 24.2 x 19.7 mm  Mean Diameter: 21.6 mm  Perimeter: 69.4 mm  Area: 367 mm2  Optimum Fluoroscopic Angle for Delivery: LAO 19 CAU 9  IMPRESSION:  1. Trileaflet aortic valve with severely calcified leaflets and  moderately restricted leaflet opening, no calcifications are  extending into the LVOT. Aortic valve calcium score is 2623  consistent with severe aortic stenosis. Annular measurements are  suitable for delivery of a 23 mm Edwards-SAPIEN 3 Ultra valve.  2. Sufficient coronary to annulus distance.  3.  Optimum Fluoroscopic Angle for Delivery: LAO 19 CAU 9  4. No thrombus in the left atrial appendage.  5. There is mild ascending aortic aneurysm with maximum diameter 43  mm.  Electronically Signed  By: Ena Dawley  On: 07/26/2019 22:12   Addended by Dorothy Spark, MD on 07/26/2019 10:14 PM  Study Result   EXAM:  OVER-READ INTERPRETATION CT CHEST  The following report is an over-read performed by radiologist Dr.  Salvatore Marvel of [facility], [place] on 07/26/2019. This over-read  does not include interpretation of cardiac or coronary anatomy or  pathology. The coronary CTA interpretation by the cardiologist is  attached.  COMPARISON: None.  FINDINGS:  Please see the separate concurrent chest CT angiogram report for  details.  IMPRESSION:  Please see the separate concurrent chest CT angiogram report for  details.  Electronically Signed:  By: Ilona Sorrel M.D.  On: 07/26/2019 10:39  CLINICAL DATA: Severe aortic stenosis. Pre-TAVR evaluation.  EXAM:  CT ANGIOGRAPHY CHEST, ABDOMEN AND PELVIS  TECHNIQUE:  Multidetector CT imaging through the chest, abdomen and pelvis was  performed using the standard protocol during bolus administration of  intravenous contrast. Multiplanar reconstructed images and MIPs were  obtained and reviewed to evaluate the vascular anatomy.  CONTRAST: 123mL OMNIPAQUE IOHEXOL 350 MG/ML SOLN  COMPARISON: None.  FINDINGS:  CTA CHEST FINDINGS  Cardiovascular: Mild cardiomegaly. No significant pericardial  effusion/thickening. Diffuse thickening and coarse calcification of  the aortic valve. Atherosclerotic thoracic aorta with ectatic 4.4 cm  ascending thoracic aorta. Normal caliber pulmonary arteries. No  central pulmonary emboli.  Mediastinum/Nodes: Subcentimeter hypodense left thyroid nodule  requires no follow-up. Unremarkable esophagus. No pathologically  enlarged axillary, mediastinal or hilar lymph nodes.  Lungs/Pleura: No pneumothorax. No  pleural effusion. Nodular 1.6 cm  subpleural somewhat bandlike opacity in the peripheral left lower  lobe adjacent to healed lateral left eighth rib deformity (series  16/image 79). Otherwise no consolidative airspace disease, lung  masses or additional significant pulmonary nodules. Mosaic  attenuation throughout both lungs with diffuse bronchial wall  thickening.  Musculoskeletal: No aggressive appearing focal osseous lesions.  Chronic appearing moderate T12 vertebral compression fracture.  Moderate thoracic spondylosis.  CTA ABDOMEN AND PELVIS FINDINGS  Hepatobiliary: Normal liver with no liver mass. Normal gallbladder  with no radiopaque cholelithiasis. No biliary ductal dilatation.  Pancreas: Normal, with no mass or duct dilation.  Spleen: Normal size. No mass.  Adrenals/Urinary Tract: No discrete adrenal nodules. No  hydronephrosis. Simple 4.0 cm lower left renal cyst. Subcentimeter  hypodense upper left renal cortical lesion is too small to  characterize and requires no follow-up. No contour deforming right  renal masses. Normal bladder.  Stomach/Bowel: Normal non-distended stomach. Normal caliber small  bowel with no small bowel wall thickening. Normal appendix. Mild  sigmoid diverticulosis, with no large bowel wall thickening or  significant pericolonic fat stranding.  Vascular/Lymphatic: Atherosclerotic nonaneurysmal abdominal aorta.  Partially visualized right superficial femoral artery stent. No  pathologically enlarged lymph nodes in the abdomen or pelvis.  Reproductive: Grossly normal uterus. No adnexal mass.  Other: No pneumoperitoneum, ascites or focal fluid collection.  Nonspecific fat and soft tissue stranding  anterior to the right  common femoral vessels, without discrete collection or mass,  presumably post procedural change. Superficial subcutaneous 1.4 cm  lesion in right inguinal region (series 15/image 185) with soft  tissue density.  Musculoskeletal: No  aggressive appearing focal osseous lesions.  Partially visualized fixation hardware in the proximal femora  bilaterally. Chronic appearing moderate L1, L2 and L4 vertebral  compression fractures. Mild lumbar spondylosis.  VASCULAR MEASUREMENTS PERTINENT TO TAVR:  AORTA:  Minimal Aortic Diameter-11.8 x 11.6 mm  Severity of Aortic Calcification-moderate  RIGHT PELVIS:  Right Common Iliac Artery -  Minimal Diameter-6.3 x 6.3 mm  Tortuosity-mild  Calcification-mild  Right External Iliac Artery -  Minimal Diameter-5.9 x 5.7 mm  Tortuosity-moderate  Calcification-none  Right Common Femoral Artery -  Minimal Diameter-5.1 x 4.5 mm  Tortuosity-mild  Calcification-moderate  LEFT PELVIS:  Left Common Iliac Artery -  Minimal Diameter-6.4 x 6.2 mm  Tortuosity-mild  Calcification-mild  Left External Iliac Artery -  Minimal Diameter-5.5 x 5.0 mm  Tortuosity-mild-to-moderate  Calcification-none  Left Common Femoral Artery -  Minimal Diameter-3.9 x 3.7 mm  Tortuosity-mild  Calcification-mild  Review of the MIP images confirms the above findings.  IMPRESSION:  1. Vascular findings and measurements pertinent to potential TAVR  procedure, as detailed.  2. Diffuse thickening and coarse calcification of the aortic valve,  compatible with the reported history of severe aortic stenosis.  3. Mild cardiomegaly.  4. Ectatic 4.4 cm ascending thoracic aorta. Recommend annual imaging  followup by CTA or MRA. This recommendation follows 2010  ACCF/AHA/AATS/ACR/ASA/SCA/SCAI/SIR/STS/SVM Guidelines for the  Diagnosis and Management of Patients with Thoracic Aortic Disease.  Circulation. 2010; 121: [id number].  5. Nonspecific nodular 1.6 cm pulmonary opacity adjacent to healed  deformity in the lateral left eighth rib, favoring posttraumatic  scarring. Suggest attention on follow-up chest CT in 3 months.  6. Diffuse bronchial wall thickening, nonspecific, as can be seen  with chronic bronchitis or  reactive airways disease.  7. Nonspecific mosaic attenuation throughout both lungs, most  commonly due to air trapping from small airways disease.  8. Mild sigmoid diverticulosis.  9. Chronic appearing multilevel thoracolumbar vertebral compression  fractures as detailed.  10. Aortic Atherosclerosis (ICD10-I70.0).  Electronically Signed  By: Ilona Sorrel M.D.  On: 07/26/2019 11:25   STS RISK CALCULATOR: Isolated AVR: Risk of Mortality: 4.823% Renal Failure: 2.988% Permanent Stroke: 2.674% Prolonged Ventilation: 10.353% DSW Infection: 0.042% Reoperation: 5.231% Morbidity or Mortality: 17.394% Short Length of Stay: 20.361% Long Length of Stay: 9.019%  Impression:   This 86 year old woman has stage D, severe, symptomatic aortic stenosis with New York Heart Association class II symptoms of exertional fatigue and shortness of breath consistent with chronic diastolic congestive heart failure which were more noticeable prior to her left hip fracture and repair when she was more active. I have personally reviewed her 2D echocardiogram, cardiac catheterization, and CTA studies. Her 2D echocardiogram from September 2020 showed a severely calcified aortic valve with restricted mobility. It appears to be trileaflet. The mean gradient was measured at 54 mmHg with a peak gradient of 85 mmHg consistent with severe aortic stenosis. Left ventricular ejection fraction was 65 to 70%. Cardiac catheterization showed 80% stenosis of a moderate size diagonal branch with no other significant disease present. The patient has not had any anginal symptoms. The mean gradient across aortic valve was measured at 50 mmHg with an aortic valve area of 0.56 cm consistent with severe aortic stenosis. I agree that aortic valve placement is indicated in  this patient who has critical aortic stenosis to prevent progressive left ventricular deterioration. She was more symptomatic prior to her hip fracture and repair last fall. I think  she would have a difficult time with open surgical aortic valve replacement due to her age and reduced mobility related to her arthritis and left hip fracture repair with continued pain related to that. I think transcatheter aortic valve replacement would be the best option for him. Her gated cardiac CTA shows anatomy suitable for transcatheter aortic valve replacement using an Edwards SAPIEN 3 valve. Her abdominal and pelvic CTA shows adequate pelvic vascular anatomy to allow transfemoral insertion.   The patient and her daughter were counseled at length regarding treatment alternatives for management of severe symptomatic aortic stenosis. The risks and benefits of surgical intervention has been discussed in detail. Long-term prognosis with medical therapy was discussed. Alternative approaches such as conventional surgical aortic valve replacement, transcatheter aortic valve replacement, and palliative medical therapy were compared and contrasted at length. This discussion was placed in the context of the patient's own specific clinical presentation and past medical history. All of their questions have been addressed.   Following the decision to proceed with transcatheter aortic valve replacement, a discussion was held regarding what types of management strategies would be attempted intraoperatively in the event of life-threatening complications, including whether or not the patient would be considered a candidate for the use of cardiopulmonary bypass and/or conversion to open sternotomy for attempted surgical intervention. I do not think she would be a candidate for emergent sternotomy to manage any intraoperative complications due to her advanced age and fragility.   The patient has been advised of a variety of complications that might develop including but not limited to risks of death, stroke, paravalvular leak, aortic dissection or other major vascular complications, aortic annulus rupture, device  embolization, cardiac rupture or perforation, mitral regurgitation, acute myocardial infarction, arrhythmia, heart block or bradycardia requiring permanent pacemaker placement, congestive heart failure, respiratory failure, renal failure, pneumonia, infection, other late complications related to structural valve deterioration or migration, or other complications that might ultimately cause a temporary or permanent loss of functional independence or other long term morbidity. The patient provides full informed consent for the procedure as described and all questions were answered.   Plan :  Transfemoral transcatheter aortic valve replacement.   Gaye Pollack, MD

## 2019-08-07 NOTE — Anesthesia Postprocedure Evaluation (Signed)
Anesthesia Post Note  Patient: [name]  Procedure(s) Performed: TRANSCATHETER AORTIC VALVE REPLACEMENT, TRANSFEMORAL (N/A ) TRANSESOPHAGEAL ECHOCARDIOGRAM (TEE) (N/A )     Patient location during evaluation: PACU Anesthesia Type: MAC Level of consciousness: awake and alert Pain management: pain level controlled Vital Signs Assessment: post-procedure vital signs reviewed and stable Respiratory status: spontaneous breathing, nonlabored ventilation, respiratory function stable and patient connected to nasal cannula oxygen Cardiovascular status: stable and blood pressure returned to baseline Postop Assessment: no apparent nausea or vomiting Anesthetic complications: no    Last Vitals:  Vitals:   08/07/19 1300 08/07/19 1305  BP: (!) 164/48 (!) 91/36  Pulse: (!) 55 (!) 56  Resp: 13 13  Temp:    SpO2: 100% 100%    Last Pain:  Vitals:   08/07/19 1247  PainSc: 0-No pain                 Rubel Heckard S

## 2019-08-07 NOTE — Interval H&P Note (Signed)
History and Physical Interval Note:  08/07/2019 9:48 AM  Michaela Simpson  has presented today for surgery, with the diagnosis of Severe Aortic Stenosis.  The various methods of treatment have been discussed with the patient and family. After consideration of risks, benefits and other options for treatment, the patient has consented to  Procedure(s): TRANSCATHETER AORTIC VALVE REPLACEMENT, TRANSFEMORAL (N/A) TRANSESOPHAGEAL ECHOCARDIOGRAM (TEE) (N/A) as a surgical intervention.  The patient's history has been reviewed, patient examined, no change in status, stable for surgery.  I have reviewed the patient's chart and labs.  Questions were answered to the patient's satisfaction.     Gaye Pollack

## 2019-08-07 NOTE — Progress Notes (Signed)
Patient ID: Michaela Simpson, female   DOB: 1932/03/01, 86 y.o.   MRN: [id number]  TCTS Evening Rounds:   Hemodynamically stable  Sinus rhythm with no heart block on monitor.  ECG: sinus with non- specific intraventricular block. QRS widened at 130 compared to 90 on preop ECG.  Awake and alert, no complaints.  RRR, no murmur Lungs clear Groin sites look good. Feet warm.   CBC    Component Value Date/Time   WBC 9.6 08/03/2019 1422   RBC 4.45 08/03/2019 1422   HGB 11.2 (L) 08/07/2019 1315   HGB 13.2 07/12/2019 1428   HCT 33.0 (L) 08/07/2019 1315   HCT 39.1 07/12/2019 1428   PLT 311 08/03/2019 1422   PLT 288 07/12/2019 1428   MCV 95.5 08/03/2019 1422   MCV 89 07/12/2019 1428   MCH 29.2 08/03/2019 1422   MCHC 30.6 08/03/2019 1422   RDW 14.4 08/03/2019 1422   RDW 13.6 07/12/2019 1428   LYMPHSABS 1.4 07/12/2019 1428   MONOABS 0.7 03/22/2015 2023   EOSABS 0.0 07/12/2019 1428   BASOSABS 0.0 07/12/2019 1428     BMET    Component Value Date/Time   NA 140 08/07/2019 1315   NA 143 07/12/2019 1428   K 4.2 08/07/2019 1315   CL 108 08/07/2019 1315   CO2 20 (L) 08/03/2019 1422   GLUCOSE 92 08/07/2019 1315   BUN 28 (H) 08/07/2019 1315   BUN 40 (H) 07/12/2019 1428   CREATININE 0.70 08/07/2019 1315   CALCIUM 9.8 08/03/2019 1422   GFRNONAA 51 (L) 08/03/2019 1422   GFRAA 59 (L) 08/03/2019 1422     A/P:  Stable postop course. Continue current plans

## 2019-08-07 NOTE — Anesthesia Procedure Notes (Signed)
Procedure Name: MAC Date/Time: 08/07/2019 10:23 AM Performed by: Amadeo Garnet, CRNA Pre-anesthesia Checklist: Patient identified, Emergency Drugs available, Suction available and Patient being monitored Patient Re-evaluated:Patient Re-evaluated prior to induction Oxygen Delivery Method: Simple face mask Induction Type: IV induction Placement Confirmation: positive ETCO2 Dental Injury: Teeth and Oropharynx as per pre-operative assessment

## 2019-08-07 NOTE — Transfer of Care (Signed)
Immediate Anesthesia Transfer of Care Note  Patient: Michaela Simpson  Procedure(s) Performed: TRANSCATHETER AORTIC VALVE REPLACEMENT, TRANSFEMORAL (N/A ) TRANSESOPHAGEAL ECHOCARDIOGRAM (TEE) (N/A )  Patient Location: Cath Lab  Anesthesia Type:MAC  Level of Consciousness: awake, alert  and oriented  Airway & Oxygen Therapy: Patient Spontanous Breathing and Patient connected to nasal cannula oxygen  Post-op Assessment: Report given to RN, Post -op Vital signs reviewed and stable and Patient moving all extremities  Post vital signs: Reviewed and stable  Last Vitals:  Vitals Value Taken Time  BP 136/43 08/07/19 1243  Temp    Pulse 57 08/07/19 1247  Resp 18 08/07/19 1247  SpO2 97 % 08/07/19 1247  Vitals shown include unvalidated device data.  Last Pain:  Vitals:   08/07/19 0821  PainSc: 5       Patients Stated Pain Goal: 2 (16/07/37 1062)  Complications: No apparent anesthesia complications

## 2019-08-07 NOTE — Progress Notes (Signed)
  Echocardiogram 2D Echocardiogram has been performed.  Jennette Dubin 08/07/2019, 12:11 PM

## 2019-08-07 NOTE — Anesthesia Preprocedure Evaluation (Signed)
Anesthesia Evaluation  Patient identified by MRN, date of birth, ID band Patient awake    Reviewed: Allergy & Precautions, NPO status , Patient's Chart, lab work & pertinent test results  Airway Mallampati: II  TM Distance: >3 FB Neck ROM: Limited    Dental no notable dental hx.    Pulmonary neg pulmonary ROS,    Pulmonary exam normal breath sounds clear to auscultation       Cardiovascular hypertension, Pt. on medications + Valvular Problems/Murmurs AS  Rhythm:Regular Rate:Normal + Systolic murmurs 1. Left ventricular ejection fraction, by visual estimation, is 65 to  70%. The left ventricle has normal function. Normal left ventricular size.  There is no left ventricular hypertrophy.  2. Global right ventricle has normal systolic function.The right  ventricular size is normal. No increase in right ventricular wall  thickness.  3. Left atrial size was normal.  4. Right atrial size was normal.  5. The mitral valve is normal in structure. No evidence of mitral valve  regurgitation. No evidence of mitral stenosis.  6. The tricuspid valve is normal in structure. Tricuspid valve  regurgitation was not visualized by color flow Doppler.  7. Aortic valve mean gradient measures 54.0 mmHg.  8. Aortic valve peak gradient measures 84.6 mmHg.  9. The aortic valve is tricuspid with severely calcified leaflets. Aortic  valve regurgitation was not visualized by color flow Doppler. Severe  aortic valve stenosis.  10. The pulmonic valve was normal in structure. Pulmonic valve  regurgitation is not visualized by color flow Doppler.  11. The inferior vena cava is normal in size with greater than 50%  respiratory variability, suggesting right atrial pressure of 3 mmHg.  12. Left ventricular diastolic Doppler parameters are consistent with  impaired relaxation pattern of LV diastolic filling.  13. Elevated left ventricular end-diastolic  pressure.  14. Moderate mitral annular calcification.    Neuro/Psych negative neurological ROS  negative psych ROS   GI/Hepatic negative GI ROS, Neg liver ROS,   Endo/Other  negative endocrine ROS  Renal/GU negative Renal ROS  negative genitourinary   Musculoskeletal negative musculoskeletal ROS (+) Arthritis , Rheumatoid disorders,    Abdominal   Peds negative pediatric ROS (+)  Hematology negative hematology ROS (+)   Anesthesia Other Findings   Reproductive/Obstetrics negative OB ROS                             Anesthesia Physical Anesthesia Plan  ASA: III  Anesthesia Plan: MAC   Post-op Pain Management:    Induction: Intravenous  PONV Risk Score and Plan: 2 and Ondansetron, Dexamethasone and Treatment may vary due to age or medical condition  Airway Management Planned: Simple Face Mask  Additional Equipment:   Intra-op Plan:   Post-operative Plan:   Informed Consent: I have reviewed the patients History and Physical, chart, labs and discussed the procedure including the risks, benefits and alternatives for the proposed anesthesia with the patient or authorized representative who has indicated his/her understanding and acceptance.     Dental advisory given  Plan Discussed with: CRNA and Surgeon  Anesthesia Plan Comments:         Anesthesia Quick Evaluation

## 2019-08-07 NOTE — Op Note (Signed)
[facility]   MULTIDISCIPLINARY HEART VALVE TEAM   TAVR OPERATIVE NOTE   Date of Procedure:  08/07/2019  Preoperative Diagnosis: Severe Aortic Stenosis   Postoperative Diagnosis: Same   Procedure:    Transcatheter Aortic Valve Replacement - Percutaneous  Transfemoral Approach  Edwards Sapien 3 Ultra THV (size 23 mm, model # 9750TFX, serial # [id number])   Co-Surgeons:  Gaye Pollack, MD and Sherren Mocha, MD  Anesthesiologist:  Myrtie Soman, MD  Echocardiographer:  Jenkins Rouge, MD  Pre-operative Echo Findings:  Severe aortic stenosis  Normal left ventricular systolic function  Post-operative Echo Findings:  No paravalvular leak  Normal left ventricular systolic function  BRIEF CLINICAL NOTE AND INDICATIONS FOR SURGERY  86 year old Michaela Simpson with hypertension, rheumatoid arthritis, chronic anemia, peripheral arterial disease with history of right SFA/popliteal stenting, who has developed severe symptomatic aortic stenosis.  This is associated with New York Heart Association functional class II symptoms of chronic diastolic heart failure.  Echo assessment demonstrated critical aortic stenosis with a mean gradient of 54 mmHg and a peak gradient of 85 mmHg.  LV function is preserved with an LVEF of 65 to 70%.  Cardiac catheterization confirmed the presence of severe aortic stenosis with a mean transvalvular gradient of 50 mmHg and calculated aortic valve area of 0.56 cm.  She was noted to have single-vessel coronary disease involving the first diagonal branch of the LAD.  Otherwise her major coronary arteries were patent without significant stenoses.  Medical therapy was recommended considering her lack of anginal symptoms.  She presents today for TAVR after undergoing multidisciplinary heart team review with formal cardiac surgical evaluation with Dr. Cyndia Bent.  During the course of the patient's preoperative work up they have been evaluated comprehensively by a  multidisciplinary team of specialists coordinated through the [facility] in the [facility].  They have been demonstrated to suffer from symptomatic severe aortic stenosis as noted above. The patient has been counseled extensively as to the relative risks and benefits of all options for the treatment of severe aortic stenosis including long term medical therapy, conventional surgery for aortic valve replacement, and transcatheter aortic valve replacement.  The patient has been independently evaluated in formal cardiac surgical consultation by Dr Cyndia Bent, who deemed the patient appropriate for TAVR. Based upon review of all of the patient's preoperative diagnostic tests they are felt to be candidate for transcatheter aortic valve replacement using the transfemoral approach as an alternative to conventional surgery.    Following the decision to proceed with transcatheter aortic valve replacement, a discussion has been held regarding what types of management strategies would be attempted intraoperatively in the event of life-threatening complications, including whether or not the patient would be considered a candidate for the use of cardiopulmonary bypass and/or conversion to open sternotomy for attempted surgical intervention.  The patient has been advised of a variety of complications that might develop peculiar to this approach including but not limited to risks of death, stroke, paravalvular leak, aortic dissection or other major vascular complications, aortic annulus rupture, device embolization, cardiac rupture or perforation, acute myocardial infarction, arrhythmia, heart block or bradycardia requiring permanent pacemaker placement, congestive heart failure, respiratory failure, renal failure, pneumonia, infection, other late complications related to structural valve deterioration or migration, or other complications that might ultimately cause a temporary or  permanent loss of functional independence or other long term morbidity.  The patient provides full informed consent for the procedure as described and all  questions were answered preoperatively.  DETAILS OF THE OPERATIVE PROCEDURE  PREPARATION:   The patient is brought to the operating room on the above mentioned date and central monitoring was established by the anesthesia team including placement of a central venous catheter and radial arterial line. The patient is placed in the supine position on the operating table.  Intravenous antibiotics are administered. The patient is monitored closely throughout the procedure under conscious sedation.  Baseline transthoracic echocardiogram is performed. The patient's chest, abdomen, both groins, and both lower extremities are prepared and draped in a sterile manner. A time out procedure is performed.   PERIPHERAL ACCESS:   Using ultrasound guidance, femoral arterial and venous access is obtained with placement of 6 Fr sheaths on the left side.  A pigtail diagnostic catheter was passed through the femoral arterial sheath under fluoroscopic guidance into the aortic root.  A temporary transvenous pacemaker catheter was passed through the femoral venous sheath under fluoroscopic guidance into the right ventricle.  The pacemaker was tested to ensure stable lead placement and pacemaker capture. Aortic root angiography was performed in order to determine the optimal angiographic angle for valve deployment.  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  We intentionally accessed the artery high so that we were well above this area.  Careful color Doppler interrogation was performed and there was no flow into the pseudoaneurysm.  A micropuncture technique is used to access the right femoral artery under fluoroscopic and ultrasound guidance.  2 Perclose devices are deployed at 10' and 2'  positions to 'PreClose' the femoral artery. An 8 French sheath is placed and then an Amplatz Superstiff wire is advanced through the sheath. This is changed out for a 14 French transfemoral E-Sheath after progressively dilating over the Superstiff wire.  An AL-1 catheter was used to direct a straight-tip exchange length wire across the native aortic valve into the left ventricle. This was exchanged out for a pigtail catheter and position was confirmed in the LV apex. Simultaneous LV and Ao pressures were recorded.  The pigtail catheter was exchanged for an Amplatz Extra-stiff wire in the LV apex.    BALLOON AORTIC VALVULOPLASTY:  Not performed  TRANSCATHETER HEART VALVE DEPLOYMENT:  An Edwards Sapien 3 transcatheter heart valve (size 23 mm) was prepared and crimped per manufacturer's guidelines, and the proper orientation of the valve is confirmed on the [employer] delivery system. The valve was advanced through the introducer sheath using normal technique until in an appropriate position in the abdominal aorta beyond the sheath tip. The balloon was then retracted and using the fine-tuning wheel was centered on the valve. The valve was then advanced across the aortic arch using appropriate flexion of the catheter. The valve was carefully positioned across the aortic valve annulus. The Commander catheter was retracted using normal technique. Once final position of the valve has been confirmed by angiographic assessment, the valve is deployed while temporarily holding ventilation and during rapid ventricular pacing to maintain systolic blood pressure < 50 mmHg and pulse pressure < 10 mmHg. The balloon inflation is held for >3 seconds after reaching full deployment volume. Once the balloon has fully deflated the balloon is retracted into the ascending aorta and valve function is assessed using echocardiography. The patient's hemodynamic recovery following valve deployment is good.  The deployment balloon  and guidewire are both removed. Echo demostrated acceptable post-procedural gradients, stable mitral valve function, and no  aortic insufficiency.    PROCEDURE COMPLETION:  The sheath was removed and femoral artery closure is performed using the 2 previously deployed Perclose devices.  Protamine is administered once femoral arterial repair was complete. The site is clear with no evidence of bleeding or hematoma after the sutures are tightened. The temporary pacemaker and pigtail catheters are removed.  Manual pressure will be used for left femoral arterial and venous hemostasis.  The patient tolerated the procedure well and is transported to the surgical intensive care in stable condition. There were no immediate intraoperative complications. All sponge instrument and needle counts are verified correct at completion of the operation.   The patient received a total of 60 mL of intravenous contrast during the procedure.   Sherren Mocha, MD 08/07/2019 9:56 AM

## 2019-08-07 NOTE — Op Note (Signed)
[facility]   MULTIDISCIPLINARY HEART VALVE TEAM   TAVR OPERATIVE NOTE   Date of Procedure:  08/07/2019  Preoperative Diagnosis: Severe Aortic Stenosis   Postoperative Diagnosis: Same   Procedure:    Transcatheter Aortic Valve Replacement - Percutaneous Right Transfemoral Approach  Edwards Sapien 3 Ultra THV (size 23 mm, model # 9750TFX, serial # [id number])   Co-Surgeons: Gaye Pollack, MD  and Sherren Mocha, MD   Anesthesiologist:  Myrtie Soman, MD  Echocardiographer:  Jenkins Rouge, MD  Pre-operative Echo Findings:  Severe aortic stenosis  Normal left ventricular systolic function  Post-operative Echo Findings:  No paravalvular leak  Normal left ventricular systolic function   BRIEF CLINICAL NOTE AND INDICATIONS FOR SURGERY  This 86 year old woman has stage D, severe, symptomatic aortic stenosis with New York Heart Association class II symptoms of exertional fatigue and shortness of breath consistent with chronic diastolic congestive heart failure which were more noticeable prior to her left hip fracture and repair when she was more active. I have personally reviewed her 2D echocardiogram, cardiac catheterization, and CTA studies. Her 2D echocardiogram from September 2020 showed a severely calcified aortic valve with restricted mobility. It appears to be trileaflet. The mean gradient was measured at 54 mmHg with a peak gradient of 85 mmHg consistent with severe aortic stenosis. Left ventricular ejection fraction was 65 to 70%. Cardiac catheterization showed 80% stenosis of a moderate size diagonal branch with no other significant disease present. The patient has not had any anginal symptoms. The mean gradient across aortic valve was measured at 50 mmHg with an aortic valve area of 0.56 cm consistent with severe aortic stenosis. I agree that aortic valve placement is indicated in this patient who has critical aortic stenosis to prevent progressive left  ventricular deterioration. She was more symptomatic prior to her hip fracture and repair last fall. I think she would have a difficult time with open surgical aortic valve replacement due to her age and reduced mobility related to her arthritis and left hip fracture repair with continued pain related to that. I think transcatheter aortic valve replacement would be the best option for him. Her gated cardiac CTA shows anatomy suitable for transcatheter aortic valve replacement using an Edwards SAPIEN 3 valve. Her abdominal and pelvic CTA shows adequate pelvic vascular anatomy to allow transfemoral insertion.   The patient and her daughter were counseled at length regarding treatment alternatives for management of severe symptomatic aortic stenosis. The risks and benefits of surgical intervention has been discussed in detail. Long-term prognosis with medical therapy was discussed. Alternative approaches such as conventional surgical aortic valve replacement, transcatheter aortic valve replacement, and palliative medical therapy were compared and contrasted at length. This discussion was placed in the context of the patient's own specific clinical presentation and past medical history. All of their questions have been addressed.   Following the decision to proceed with transcatheter aortic valve replacement, a discussion was held regarding what types of management strategies would be attempted intraoperatively in the event of life-threatening complications, including whether or not the patient would be considered a candidate for the use of cardiopulmonary bypass and/or conversion to open sternotomy for attempted surgical intervention. I do not think she would be a candidate for emergent sternotomy to manage any intraoperative complications due to her advanced age and fragility.   The patient has been advised of a variety of complications that might develop including but not limited to risks of death, stroke,  paravalvular leak, aortic dissection  or other major vascular complications, aortic annulus rupture, device embolization, cardiac rupture or perforation, mitral regurgitation, acute myocardial infarction, arrhythmia, heart block or bradycardia requiring permanent pacemaker placement, congestive heart failure, respiratory failure, renal failure, pneumonia, infection, other late complications related to structural valve deterioration or migration, or other complications that might ultimately cause a temporary or permanent loss of functional independence or other long term morbidity. The patient provides full informed consent for the procedure as described and all questions were answered.     DETAILS OF THE OPERATIVE PROCEDURE  PREPARATION:    The patient is brought to cath lab room 6 on the above mentioned date and appropriate monitoring was established by the anesthesia team. The patient is placed in the supine position on the operating table.  Intravenous antibiotics are administered. The patient is monitored closely throughout the procedure under conscious sedation.    Baseline transthoracic echocardiogram was performed. The patient's abdomen and both groins are prepared and draped in a sterile manner. A time out procedure is performed.   PERIPHERAL ACCESS:    Using the modified Seldinger technique, femoral arterial and venous access was obtained with placement of 6 Fr sheaths on the left side.  A pigtail diagnostic catheter was passed through the left arterial sheath under fluoroscopic guidance into the aortic root.  A temporary transvenous pacemaker catheter was passed through the left femoral venous sheath under fluoroscopic guidance into the right ventricle.  The pacemaker was tested to ensure stable lead placement and pacemaker capture. Aortic root angiography was performed in order to determine the optimal angiographic angle for valve deployment.   TRANSFEMORAL ACCESS:   Percutaneous  transfemoral access and sheath placement was performed using ultrasound guidance.  The right common femoral artery was cannulated using a micropuncture needle and appropriate location was verified using hand injection angiogram.  A pair of [employer] Perclose percutaneous closure devices were placed and a 6 French sheath replaced into the femoral artery.  The patient was heparinized systemically and ACT verified > 250 seconds.    A 14 Fr transfemoral E-sheath was introduced into the right common femoral artery after progressively dilating over an Amplatz superstiff wire. An AL-1 catheter was used to direct a straight-tip exchange length wire across the native aortic valve into the left ventricle. This was exchanged out for a pigtail catheter and position was confirmed in the LV apex. Simultaneous LV and Ao pressures were recorded.  The pigtail catheter was exchanged for an Amplatz Extra-stiff wire in the LV apex.    BALLOON AORTIC VALVULOPLASTY:   Not performed  TRANSCATHETER HEART VALVE DEPLOYMENT:   An Edwards Sapien 3 Ultra transcatheter heart valve (size 23 mm, model #9750TFX, serial [id number]) was prepared and crimped per manufacturer's guidelines, and the proper orientation of the valve is confirmed on the [employer] delivery system. The valve was advanced through the introducer sheath using normal technique until in an appropriate position in the abdominal aorta beyond the sheath tip. The balloon was then retracted and using the fine-tuning wheel was centered on the valve. The valve was then advanced across the aortic arch using appropriate flexion of the catheter. The valve was carefully positioned across the aortic valve annulus. The Commander catheter was retracted using normal technique. Once final position of the valve has been confirmed by angiographic assessment, the valve is deployed while temporarily holding ventilation and during rapid ventricular pacing to maintain systolic blood  pressure < 50 mmHg and pulse pressure < 10 mmHg. The balloon inflation is held  for >3 seconds after reaching full deployment volume. Once the balloon has fully deflated the balloon is retracted into the ascending aorta and valve function is assessed using echocardiography. There is felt to be no paravalvular leak and no central aortic insufficiency.  The patient's hemodynamic recovery following valve deployment is good.  The deployment balloon and guidewire are both removed.    PROCEDURE COMPLETION:   The sheath was removed and femoral artery closure performed.  Protamine was administered once femoral arterial repair was complete. The temporary pacemaker, pigtail catheters and femoral sheaths were removed with manual pressure used for hemostasis.    The patient tolerated the procedure well and is transported to the cath lab recovery area in stable condition. There were no immediate intraoperative complications. All sponge instrument and needle counts are verified correct at completion of the operation.   No blood products were administered during the operation.  The patient received a total of 60 mL of intravenous contrast during the procedure.   Gaye Pollack, MD 08/07/2019 4:45 PM

## 2019-08-08 ENCOUNTER — Inpatient Hospital Stay (HOSPITAL_COMMUNITY): Payer: Medicare Other

## 2019-08-08 DIAGNOSIS — Z952 Presence of prosthetic heart valve: Secondary | ICD-10-CM

## 2019-08-08 DIAGNOSIS — Z954 Presence of other heart-valve replacement: Secondary | ICD-10-CM

## 2019-08-08 DIAGNOSIS — I350 Nonrheumatic aortic (valve) stenosis: Secondary | ICD-10-CM

## 2019-08-08 HISTORY — DX: Presence of prosthetic heart valve: Z95.2

## 2019-08-08 LAB — BASIC METABOLIC PANEL
Anion gap: 12 (ref 5–15)
BUN: 28 mg/dL — ABNORMAL HIGH (ref 8–23)
CO2: 21 mmol/L — ABNORMAL LOW (ref 22–32)
Calcium: 9 mg/dL (ref 8.9–10.3)
Chloride: 106 mmol/L (ref 98–111)
Creatinine, Ser: 0.82 mg/dL (ref 0.44–1.00)
GFR calc Af Amer: >60 mL/min (ref >60)
GFR calc non Af Amer: >60 mL/min (ref >60)
Glucose, Bld: 101 mg/dL — ABNORMAL HIGH (ref 70–99)
Potassium: 4.1 mmol/L (ref 3.5–5.1)
Sodium: 139 mmol/L (ref 135–145)

## 2019-08-08 LAB — CBC
HCT: 35.8 % — ABNORMAL LOW (ref 36.0–46.0)
Hemoglobin: 11 g/dL — ABNORMAL LOW (ref 12.0–15.0)
MCH: 29.1 pg (ref 26.0–34.0)
MCHC: 30.7 g/dL (ref 30.0–36.0)
MCV: 94.7 fL (ref 80.0–100.0)
Platelets: 210 10*3/uL (ref 150–400)
RBC: 3.78 MIL/uL — ABNORMAL LOW (ref 3.87–5.11)
RDW: 14.5 % (ref 11.5–15.5)
WBC: 8.8 10*3/uL (ref 4.0–10.5)
nRBC: 0 % (ref 0.0–0.2)

## 2019-08-08 LAB — MAGNESIUM: Magnesium: 2.1 mg/dL (ref 1.7–2.4)

## 2019-08-08 NOTE — Discharge Instructions (Signed)
ACTIVITY AND EXERCISE °• Daily activity and exercise are an important part of your recovery. People recover at different rates depending on their general health and type of valve procedure. °• Most people recovering from TAVR feel better relatively quickly  °• No lifting, pushing, pulling more than 10 pounds (examples to avoid: groceries, vacuuming, gardening, golfing): °            - For one week with a procedure through the groin. °            - For six weeks for procedures through the chest wall or neck. °NOTE: You will typically see one of our providers 7-14 days after your procedure to discuss WHEN TO RESUME the above activities.  °  °  °DRIVING °• Do not drive until you are seen for follow up and cleared by a provider. Generally, we ask patient to not drive for 1 week after their procedure. °• If you have been told by your doctor in the past that you may not drive, you must talk with him/her before you begin driving again. °  °DRESSING °• Groin site: you may leave the clear dressing over the site for up to one week or until it falls off. °  °HYGIENE °• If you had a femoral (leg) procedure, you may take a shower when you return home. After the shower, pat the site dry. Do NOT use powder, oils or lotions in your groin area until the site has completely healed. °• If you had a chest procedure, you may shower when you return home unless specifically instructed not to by your discharging practitioner. °            - DO NOT scrub incision; pat dry with a towel. °            - DO NOT apply any lotions, oils, powders to the incision. °            - No tub baths / swimming for at least 2 weeks. °• If you notice any fevers, chills, increased pain, swelling, bleeding or pus, please contact your doctor. °  °ADDITIONAL INFORMATION °• If you are going to have an upcoming dental procedure, please contact our office as you will require antibiotics ahead of time to prevent infection on your heart valve.  ° ° °If you have any  questions or concerns you can call the structural heart phone during normal business hours 8am-4pm. If you have an urgent need after hours or weekends please call [phone] to talk to the on call provider for general cardiology. If you have an emergency that requires immediate attention, please call 911.  ° ° °After TAVR Checklist ° °Check  Test Description  ° Follow up appointment in 1-2 weeks  You will see our structural heart physician assistant, Michaela Simpson. Your incision sites will be checked and you will be cleared to drive and resume all normal activities if you are doing well.    ° 1 month echo and follow up  You will have an echo to check on your new heart valve and be seen back in the office by Michaela Simpson. Many times the echo is not read by your appointment time, but Michaela will call you later that day or the following day to report your results.  ° Follow up with your primary cardiologist You will need to be seen by your primary cardiologist in the following 3-6 months after your 1 month appointment in the valve   clinic. Often times your Plavix or Aspirin will be discontinued during this time, but this is decided on a case by case basis.   ° 1 year echo and follow up You will have another echo to check on your heart valve after 1 year and be seen back in the office by Michaela Simpson. This your last structural heart visit.  ° Bacterial endocarditis prophylaxis  You will have to take antibiotics for the rest of your life before all dental procedures (even teeth cleanings) to protect your heart valve. Antibiotics are also required before some surgeries. Please check with your cardiologist before scheduling any surgeries. Also, please make sure to tell us if you have a penicillin allergy as you will require an alternative antibiotic.   ° ° °

## 2019-08-08 NOTE — Progress Notes (Signed)
CARDIAC REHAB PHASE I   PRE:  Rate/Rhythm: 97 SR    BP: sitting 110/61    SaO2: 97 RA  MODE:  Ambulation: 120 ft   POST:  Rate/Rhythm: 115 ST    BP: sitting 146/63     SaO2: 96 RA  Pt stood slowly but independently (turned and held to arm of chair). Used cane with slow, deliberate pace. I used gait belt for safety but not needed. Sts less SOB after walking. To recliner. Discussed restrictions. Encouraged activity/walking in house as tolerated. To discuss more therapy with her hip MD. N/a at this time for CRPII. [id number]   [place], ACSM 08/08/2019 9:06 AM

## 2019-08-08 NOTE — Progress Notes (Signed)
1 Day Post-Op Procedure(s) (LRB): TRANSCATHETER AORTIC VALVE REPLACEMENT, TRANSFEMORAL (N/A) TRANSESOPHAGEAL ECHOCARDIOGRAM (TEE) (N/A) Subjective: No complaints. Ambulated down the hall this am without difficulty. Feels better than preop.  No heart block on monitor.  Objective: Vital signs in last 24 hours: Temp:  [96.9 F (36.1 C)-99.4 F (37.4 C)] 99.4 F (37.4 C) (02/17 0505) Pulse Rate:  [0-92] 92 (02/17 0505) Cardiac Rhythm: Normal sinus rhythm (02/17 0505) Resp:  [0-47] 16 (02/17 0505) BP: (77-174)/(32-98) 132/67 (02/17 0505) SpO2:  [0 %-100 %] 99 % (02/17 0505) Weight:  [47.8 kg-50.7 kg] 47.8 kg (02/17 0505)  Hemodynamic parameters for last 24 hours:    Intake/Output from previous day: 02/16 0701 - 02/17 0700 In: 2127.8 [P.O.:720; I.V.:1407.8] Out: 25 [Blood:25] Intake/Output this shift: No intake/output data recorded.  General appearance: alert and cooperative Neurologic: intact Heart: regular rate and rhythm, S1, S2 normal, no murmur, click, rub or gallop Lungs: clear to auscultation bilaterally Extremities: feet warm Wound: groin sites look good.  Lab Results: Recent Labs    08/07/19 1315 08/08/19 0220  WBC  --  8.8  HGB 11.2* 11.0*  HCT 33.0* 35.8*  PLT  --  210   BMET:  Recent Labs    08/07/19 1315 08/08/19 0220  NA 140 139  K 4.2 4.1  CL 108 106  CO2  --  21*  GLUCOSE 92 101*  BUN 28* 28*  CREATININE 0.70 0.82  CALCIUM  --  9.0    PT/INR: No results for input(s): LABPROT, INR in the last 72 hours. ABG    Component Value Date/Time   PHART 7.427 08/03/2019 1414   HCO3 23.4 08/03/2019 1414   TCO2 23 08/07/2019 1315   ACIDBASEDEF 0.4 08/03/2019 1414   O2SAT 98.8 08/03/2019 1414   CBG (last 3)  No results for input(s): GLUCAP in the last 72 hours.  ECG: sinus, QRS back to normal at 94 ms.  Assessment/Plan: S/P Procedure(s) (LRB): TRANSCATHETER AORTIC VALVE REPLACEMENT, TRANSFEMORAL (N/A) TRANSESOPHAGEAL ECHOCARDIOGRAM (TEE)  (N/A)  POD 1 Hemodynamically stable in sinus rhythm. 2D echo pending. Plan home today on ASA and Plavix Follow up has been arranged by Ander Purpura.   LOS: 1 day    Gaye Pollack 08/08/2019

## 2019-08-08 NOTE — Discharge Summary (Addendum)
Physician Discharge Summary  Patient ID: Michaela Simpson MRN: [id number] DOB/AGE: [birth year] 86 y.o.  Admit date: 08/07/2019 Discharge date: 08/08/2019  Admission Diagnoses:  Patient Active Problem List   Diagnosis Date Noted  . S/P TAVR (transcatheter aortic valve replacement) 08/08/2019  . Severe aortic stenosis 07/19/2019  . Essential hypertension, benign 03/15/2019  . Sequelae of open wound of right lower extremity 03/15/2019  . Major depression, recurrent, chronic ([place]) 03/15/2019  . Chronic constipation 03/15/2019  . Closed left humeral fracture 03/08/2019  . Wound of right leg, initial encounter 03/08/2019  . Leukocytosis 03/08/2019  . Fracture of femoral neck, left, closed ([place]) 03/08/2019  . AKI (acute kidney injury) ([place]) 03/08/2019  . Depression   . HTN (hypertension)   . Rash and nonspecific skin eruption 04/06/2015  . Acute blood loss anemia 03/22/2015  . Edema 03/21/2015  . Acute renal failure syndrome ([place])   . Closed right hip fracture ([place]) 03/14/2015  . Rheumatoid arthritis ([place]) 03/14/2015  . Fall 03/14/2015   Discharge Diagnoses:   Patient Active Problem List   Diagnosis Date Noted  . S/P TAVR (transcatheter aortic valve replacement) 08/08/2019  . Severe aortic stenosis 07/19/2019  . Essential hypertension, benign 03/15/2019  . Sequelae of open wound of right lower extremity 03/15/2019  . Major depression, recurrent, chronic ([place]) 03/15/2019  . Chronic constipation 03/15/2019  . Closed left humeral fracture 03/08/2019  . Wound of right leg, initial encounter 03/08/2019  . Leukocytosis 03/08/2019  . Fracture of femoral neck, left, closed ([place]) 03/08/2019  . AKI (acute kidney injury) ([place]) 03/08/2019  . Depression   . HTN (hypertension)   . Rash and nonspecific skin eruption 04/06/2015  . Acute blood loss anemia 03/22/2015  . Edema 03/21/2015  . Acute renal failure syndrome ([place])   . Closed right hip fracture ([place]) 03/14/2015  . Rheumatoid  arthritis ([place]) 03/14/2015  . Fall 03/14/2015   Discharged Condition: good  History of Present Illness:  The patient is an 86 yo female with a history of HTN, RA, and PAD S/P SFA stenting 04/2019 to heal a right lower leg ulcer, and aortic stenosis.  The patient was hospitalized on September 2020 after suffering a fall which resulted in a left hip fracture and left humerus fracture.  She underwent ORIF of the hip fracture, but did not require operative repair of the left humeral fracture.  The patient was noted to have a heart murmur.  Echocardiogram showed severe aortic stenosis.  She had been seen by Dr. Burt Knack in October or 2020 at which time she was felt to be a reasonable candidate for TAVR.  However with her recent hip surgery she would require more time to recover prior to proceeding.  The patient was evaluated by Dr. Cyndia Bent on 08/01/2019 at which time she was walking with a cane or walker.  The patient continues to have pain in her left hip and thigh which limits her mobility.  The patient denied shortness of breath and chest pain.  She denied dizziness and syncopal episodes.  The patient does have fatigue with shortness of breath with activity over the past year and they had to rest when they went for walks.  The patient was felt to be a candidate for TAVR procedure.  The risks and benefits of the procedure were explained to the patient and she was agreeable to proceed.   Hospital Course:   The patient presented to [facility] on 08[facility]08/2019.  She was taken to the cath lab  and underwent TAVR with an Edwards Sapien 3 Ultra THV via Percutaneous Right Transfemoral Approach.  The patient tolerated the procedure without difficulty and was taken to the recovery room in stable condition.  The patient did well post operatively.  Her post operative EKG showed non specific intraventricular block.  Her QRS complex was also widended.  She was observed overnight and had no further heart block on the  monitor.  She was walked with cardiac rehab without difficulty.  Follow up Echocardiogram has been obtained.  She is felt medically stable for discharge home today.     Consults: None  Treatments: surgery:    Transcatheter Aortic Valve Replacement - Percutaneous Right Transfemoral Approach             Edwards Sapien 3 Ultra THV (size 23 mm, model # 9750TFX, serial # [id number])  Discharge Exam: Blood pressure (!) 146/63, pulse 92, temperature 99.4 F (37.4 C), temperature source Oral, resp. rate 16, height 5' (1.524 m), weight 47.8 kg, SpO2 99 %.  General appearance: alert and cooperative Neurologic: intact Heart: regular rate and rhythm, S1, S2 normal, no murmur, click, rub or gallop Lungs: clear to auscultation bilaterally Extremities: feet warm Wound: groin sites look good.  Discharge disposition: 01-Home or Self Care  Allergies as of 08/08/2019      Reactions   Penicillins Nausea And Vomiting, Other (See Comments)   Child hood allergy - Flu symptoms; constant vomiting  Did it involve swelling of the face/tongue/throat, SOB, or low BP? Unknown Did it involve sudden or severe rash/hives, skin peeling, or any reaction on the inside of your mouth or nose? Unknown Did you need to seek medical attention at a hospital or doctor's office? Unknown When did it last happen?50+ years If all above answers are "NO", may proceed with cephalosporin use.   Codeine Nausea And Vomiting, Rash   INTOLERANCE >  VOMITING      Medication List    TAKE these medications   acetaminophen 325 MG tablet Commonly known as: TYLENOL Take 2 tablets (650 mg total) by mouth every 6 (six) hours as needed for mild pain (or Fever >/= 101). What changed:   how much to take  when to take this   amLODipine 2.5 MG tablet Commonly known as: NORVASC Take 1 tablet (2.5 mg total) by mouth at bedtime.   aspirin EC 81 MG tablet Take 81 mg by mouth daily.   clopidogrel 75 MG tablet Commonly known as:  Plavix Take 1 tablet (75 mg total) by mouth daily.   lactose free nutrition Liqd Take 237 mLs by mouth 3 (three) times daily.   LORazepam 0.5 MG tablet Commonly known as: ATIVAN Take 0.5 mg by mouth at bedtime.   MIRALAX PO Take 17 g by mouth daily.   multivitamin with minerals tablet Take 1 tablet by mouth daily.   OSTEO BI-FLEX ONE PER DAY PO Take 1 tablet by mouth daily.   PROBIOTIC ACIDOPHILUS PO Take 1 tablet by mouth daily.   rosuvastatin 10 MG tablet Commonly known as: Crestor Take 1 tablet (10 mg total) by mouth at bedtime.   sertraline 100 MG tablet Commonly known as: ZOLOFT Take 150 mg by mouth daily.   vitamin B-12 1000 MCG tablet Commonly known as: CYANOCOBALAMIN Take 1,000 mcg by mouth daily.       Signed:  Ellwood Handler PA-C ( I did not provide care for this patient, I dictated the discharge summary only) 08/08/2019, 10:09 AM

## 2019-08-08 NOTE — Progress Notes (Signed)
  Echocardiogram 2D Echocardiogram has been performed.  Geoffery Lyons Swaim 08/08/2019, 10:07 AM

## 2019-08-08 NOTE — Progress Notes (Signed)
Patient and family given discharge instructions, medication list and follow up appointments given. Patient verbalized understanding IV tele dcd. Will discharge home as ordered. Transported to exit via wheel chair and hospital staff. Andra Heslin, Bettina Gavia RN

## 2019-08-09 ENCOUNTER — Telehealth: Payer: Self-pay

## 2019-08-09 NOTE — Telephone Encounter (Signed)
Patient contacted regarding discharge from [facility] on 08/08/2019.  Patient understands to follow up with provider Nell Range PA-C on 08/22/2019 at 1:30 PM at [facility] location. Patient understands discharge instructions? yes Patient understands medications and regiment? yes Patient understands to bring all medications to this visit? yes

## 2019-08-13 LAB — ECHOCARDIOGRAM COMPLETE
Height: 60 in
Weight: 1686.4 oz

## 2019-08-21 NOTE — Progress Notes (Signed)
[facility]                                       Cardiology Office Note    Date:  08/22/2019   ID:  Michaela Simpson 07-05-1931, MRN [id number]  PCP:  Michaela Squibb, MD  Cardiologist: Dr. Burt Simpson  CC: TOC s/p TAVR  History of Present Illness:  Michaela Simpson is a 86 y.o. female with a history of HTN, rheumatoid arthritis, anemia, CAD, PAD with nonhealing ulcer s/p right superficial femoral-popliteal artery stenting (04/2019) and severe AS s/p TAVR (08/07/19) who presents to clinic for follow up.  She was hospitalized in 02/2019 for a mechanical fall resulting in a left hip fracture and left humerus fracture. She underwent ORIF of the hip fracture and nonoperative treatment of the humerus fracture was recommended. During her hospitalization she was noted to have a heart murmur and an echocardiogram was ordered. This demonstrated severe aortic stenosis and she is referred for outpatient consultation to review treatment options.   She was seen by Dr. Burt Simpson in the office on 04/12/2019 and felt to be a good candidate for TAVR but she requested some more time to recover from her recent hip/humerus fracture. She was seen by Dr. Trula Simpson for a non healing ulcer of her right leg. She underwent mechanical thrombectomy and stenting of the right superficial femoral-popliteal artery on 05/08/19. Placed on DAPT with aspirin and plavix. [facility] 07/19/19 showed severe single vessel CAD involving a moderate caliber (2 mm) first diagonal branch of the LAD, otherwise widely patent left main, LAD, LCx, and RCA as well as severe calcific AS with mean gradient 50 mmHg and AVA 0.56 square cm.  She underwent successful TAVR with a 23 mm Edwards S3U on 08/07/19. Post op echo showed EF 60% with a normally functioning TAVR with a mean gradient of 13 mm Hg and no PVL. She was discharged on aspirin and plavix.  Today she presents to clinic for follow up. No  CP or SOB. No LE edema, orthopnea or PND. No dizziness or syncope. No blood in stool or urine. No palpitations. Feels like she doesn't get as tired out as she used to. Having issues with hip pain.    Past Medical History:  Diagnosis Date  . AKI (acute kidney injury) ([place])   . Anemia   . Aortic stenosis   . Depression   . Hip fracture ([place])   . HTN (hypertension)   . Humerus fracture   . Leukocytosis   . Osteoporosis   . Peripheral vascular disease ([place])    blood clot right leg  . Rheumatoid arthritis ([place])   . Ulcer of lower extremity [facility])     Past Surgical History:  Procedure Laterality Date  . ABDOMINAL AORTOGRAM W/LOWER EXTREMITY N/A 05/08/2019   Procedure: ABDOMINAL AORTOGRAM W/LOWER EXTREMITY;  Surgeon: Michaela Mitchell, MD;  Location: [place] [facility];  Service: Cardiovascular;  Laterality: N/A;  . HIP PINNING,CANNULATED Left 03/09/2019   Procedure: CANNULATED HIP PINNING;  Surgeon: Michaela Rossetti, MD;  Location: [place];  Service: Orthopedics;  Laterality: Left;  . INTRAMEDULLARY (IM) NAIL INTERTROCHANTERIC Right 03/14/2015   Procedure: RIGHT INTERTROCHANTRIC INTRAMEDULLARY (IM) NAIL ;  Surgeon: Michaela Rossetti, MD;  Location: [place];  Service: Orthopedics;  Laterality: Right;  . PERIPHERAL VASCULAR INTERVENTION Right 05/08/2019   Procedure:  PERIPHERAL VASCULAR INTERVENTION;  Surgeon: Michaela Mitchell, MD;  Location: [place] [facility];  Service: Cardiovascular;  Laterality: Right;  . RIGHT/LEFT HEART CATH AND CORONARY ANGIOGRAPHY N/A 07/19/2019   Procedure: RIGHT/LEFT HEART CATH AND CORONARY ANGIOGRAPHY;  Surgeon: Michaela Mocha, MD;  Location: [place] [facility];  Service: Cardiovascular;  Laterality: N/A;  . TEE WITHOUT CARDIOVERSION N/A 08/07/2019   Procedure: TRANSESOPHAGEAL ECHOCARDIOGRAM (TEE);  Surgeon: Michaela Mocha, MD;  Location: [place] [facility];  Service: Open Heart Surgery;  Laterality: N/A;  . TRANSCATHETER AORTIC VALVE REPLACEMENT,  TRANSFEMORAL  08/07/2019  . TRANSCATHETER AORTIC VALVE REPLACEMENT, TRANSFEMORAL N/A 08/07/2019   Procedure: TRANSCATHETER AORTIC VALVE REPLACEMENT, TRANSFEMORAL;  Surgeon: Michaela Mocha, MD;  Location: [place] [facility];  Service: Open Heart Surgery;  Laterality: N/A;    Current Medications: Outpatient Medications Prior to Visit  Medication Sig Dispense Refill  . acetaminophen (TYLENOL) 325 MG tablet Take 2 tablets (650 mg total) by mouth every 6 (six) hours as needed for mild pain (or Fever >/= 101). (Patient taking differently: Take 325 mg by mouth every 4 (four) hours as needed for mild pain (or Fever >/= 101). )    . amLODipine (NORVASC) 2.5 MG tablet Take 1 tablet (2.5 mg total) by mouth at bedtime. 30 tablet 0  . aspirin EC 81 MG tablet Take 81 mg by mouth daily.    . [place]-Glucosamine-Vit D (OSTEO BI-FLEX ONE PER DAY PO) Take 1 tablet by mouth daily.    . clopidogrel (PLAVIX) 75 MG tablet Take 1 tablet (75 mg total) by mouth daily. 30 tablet 11  . Lactobacillus (PROBIOTIC ACIDOPHILUS PO) Take 1 tablet by mouth daily.    Marland Kitchen lactose free nutrition (BOOST) LIQD Take 237 mLs by mouth 3 (three) times daily.     Marland Kitchen LORazepam (ATIVAN) 0.5 MG tablet Take 0.5 mg by mouth at bedtime.     . Multiple Vitamins-Minerals (MULTIVITAMIN WITH MINERALS) tablet Take 1 tablet by mouth daily.    . Polyethylene Glycol 3350 (MIRALAX PO) Take 17 g by mouth daily.     . rosuvastatin (CRESTOR) 10 MG tablet Take 1 tablet (10 mg total) by mouth at bedtime. 30 tablet 11  . sertraline (ZOLOFT) 100 MG tablet Take 150 mg by mouth daily.     . vitamin B-12 (CYANOCOBALAMIN) 1000 MCG tablet Take 1,000 mcg by mouth daily.     No facility-administered medications prior to visit.     Allergies:   Penicillins and Codeine   Social History   Socioeconomic History  . Marital status: Widowed    Spouse name: Not on file  . Number of children: Not on file  . Years of education: Not on file  . Highest education  level: Not on file  Occupational History  . Not on file  Tobacco Use  . Smoking status: Never Smoker  . Smokeless tobacco: Never Used  Substance and Sexual Activity  . Alcohol use: No  . Drug use: No  . Sexual activity: Not on file  Other Topics Concern  . Not on file  Social History Narrative  . Not on file   Social Determinants of Health   Financial Resource Strain: Low Risk   . Difficulty of Paying Living Expenses: Not hard at all  Food Insecurity: No Food Insecurity  . Worried About Charity fundraiser in the Last Year: Never true  . Ran Out of Food in the Last Year: Never true  Transportation Needs: No Transportation Needs  .  Lack of Transportation (Medical): No  . Lack of Transportation (Non-Medical): No  Physical Activity: Unknown  . Days of Exercise per Week: Patient refused  . Minutes of Exercise per Session: Patient refused  Stress: No Stress Concern Present  . Feeling of Stress : Not at all  Social Connections: Unknown  . Frequency of Communication with Friends and Family: Patient refused  . Frequency of Social Gatherings with Friends and Family: Patient refused  . Attends Religious Services: Patient refused  . Active Member of Clubs or Organizations: Patient refused  . Attends Archivist Meetings: Patient refused  . Marital Status: Patient refused     Family History:  The patient's family history includes Cancer in her sister.     ROS:   Please see the history of present illness.    ROS All other systems reviewed and are negative.   PHYSICAL EXAM:   VS:  BP 128/70   Pulse 72   Ht 5' (1.524 m)   Wt 104 lb (47.2 kg)   BMI 20.31 kg/m    GEN: Well nourished, well developed, in no acute distress HEENT: normal Neck: no JVD or masses Cardiac: RRR; no murmurs, rubs, or gallops,no edema  Respiratory:  clear to auscultation bilaterally, normal work of breathing GI: soft, nontender, nondistended, + BS MS: no deformity or atrophy Skin: warm and  dry, no rash.  Groin sites clear without hematoma or ecchymosis  Neuro:  Alert and Oriented x 3, Strength and sensation are intact Psych: euthymic mood, full affect   Wt Readings from Last 3 Encounters:  08/22/19 104 lb (47.2 kg)  08/08/19 105 lb 6.4 oz (47.8 kg)  08/03/19 104 lb 3 oz (47.3 kg)      Studies/Labs Reviewed:   EKG:  EKG ordered today.  The ekg ordered today demonstrates sinus HR 72  Recent Labs: 08/03/2019: ALT 15; B Natriuretic Peptide 132.4 08/08/2019: BUN 28; Creatinine, Ser 0.82; Hemoglobin 11.0; Magnesium 2.1; Platelets 210; Potassium 4.1; Sodium 139   Lipid Panel No results found for: CHOL, TRIG, HDL, CHOLHDL, VLDL, LDLCALC, LDLDIRECT  Additional studies/ records that were reviewed today include:   TAVR OPERATIVE NOTE   Date of Procedure:                08/07/2019  Preoperative Diagnosis:      Severe Aortic Stenosis   Postoperative Diagnosis:    Same   Procedure:        Transcatheter Aortic Valve Replacement - Percutaneous Right Transfemoral Approach             Edwards Sapien 3 Ultra THV (size 23 mm, model # 9750TFX, serial # [id number])              Co-Surgeons:            Gaye Pollack, MD   and Michaela Mocha, MD   Anesthesiologist:                  Myrtie Soman, MD  Echocardiographer:              Jenkins Rouge, MD  Pre-operative Echo Findings: ? Severe aortic stenosis ? Normal left ventricular systolic function  Post-operative Echo Findings: ? No paravalvular leak ? Normal left ventricular systolic function  __________________  Echo 08/08/19 IMPRESSIONS  1. Left ventricular ejection fraction, by estimation, is 60 to 65%. The  left ventricle has normal function. The left ventricle has no regional  wall motion abnormalities. Left ventricular diastolic parameters are  consistent with Grade I diastolic dysfunction (impaired relaxation).  2. Right ventricular systolic function is normal. The right ventricular  size is normal.   3. Right atrial size was mildly dilated.  4. The mitral valve is degenerative. No evidence of mitral valve regurgitation.  5. Post TAVR with 23 mm Sapien 3 Ultra valve. Good position with no  significant PVL Compared to implant now post op day one peak velocity 1.5  m/sec->2.4, mean gradient 5 mmHg ->13 mmHg and AVA 2.0 cm2 -> 1.6 cm2  Current DVI is normal at 0.51 . The  aortic valve has been repaired/replaced. Aortic valve regurgitation is not visualized. There is a 23 mm Edwards Sapien prosthetic (TAVR) valve present in the aortic position. Procedure Date: 08/07/2019.  6. Pulmonic valve regurgitation not assessed.  7. Aortic dilatation noted. There is dilatation of the aortic root  measuring 42 mm.   ASSESSMENT & PLAN:   Severe AS s/p TAVR: doing well. ECG shows no HAVB. Groin site healing slowly but healing well. Continue on aspirin and plavix. SBE prophylaxis discussed; I have RX'd clindamycin due to a PCN allergy. I will see her back in a few weeks for 1 month echo and follow up.   PAD s/p right superficial femoral artery stenting: continue on DAPT. Followed by Dr. Trula Simpson.   HTN: BP well controlled today.   CAD: pre TAVR cath showed severe single vessel CAD involving a moderate caliber (2 mm) first diagonal branch of the LAD, otherwise widely patent left main, LAD, LCx, and RCA. Continue medical therapy.   Medication Adjustments/Labs and Tests Ordered: Current medicines are reviewed at length with the patient today.  Concerns regarding medicines are outlined above.  Medication changes, Labs and Tests ordered today are listed in the Patient Instructions below. Patient Instructions  Medication Instructions:  Your provider discussed the importance of taking an antibiotic prior to all dental visits to prevent damage to the heart valves from infection. You were given a prescription for CLINDAMYCIN 600 mg to take one hour prior to any dental appointment.  *If you need a refill on  your cardiac medications before your next appointment, please call your pharmacy*   Follow-Up: Please keep your upcoming appointments!    Signed, Angelena Form, PA-C  08/22/2019 3:57 PM    [facility] [address], [place], [place]  [address] Phone: [phone]; Fax: [phone]

## 2019-08-22 ENCOUNTER — Other Ambulatory Visit: Payer: Self-pay

## 2019-08-22 ENCOUNTER — Encounter: Payer: Self-pay | Admitting: Physician Assistant

## 2019-08-22 ENCOUNTER — Ambulatory Visit (INDEPENDENT_AMBULATORY_CARE_PROVIDER_SITE_OTHER): Payer: Medicare Other | Admitting: Physician Assistant

## 2019-08-22 VITALS — BP 128/70 | HR 72 | Ht 60.0 in | Wt 104.0 lb

## 2019-08-22 DIAGNOSIS — Z952 Presence of prosthetic heart valve: Secondary | ICD-10-CM

## 2019-08-22 DIAGNOSIS — I10 Essential (primary) hypertension: Secondary | ICD-10-CM | POA: Diagnosis not present

## 2019-08-22 DIAGNOSIS — I2510 Atherosclerotic heart disease of native coronary artery without angina pectoris: Secondary | ICD-10-CM | POA: Diagnosis not present

## 2019-08-22 DIAGNOSIS — I739 Peripheral vascular disease, unspecified: Secondary | ICD-10-CM

## 2019-08-22 MED ORDER — CLINDAMYCIN HCL 300 MG PO CAPS: ORAL_CAPSULE | ORAL | 11 refills | Status: DC

## 2019-08-22 NOTE — Patient Instructions (Signed)
Medication Instructions:  Your provider discussed the importance of taking an antibiotic prior to all dental visits to prevent damage to the heart valves from infection. You were given a prescription for CLINDAMYCIN 600 mg to take one hour prior to any dental appointment.  *If you need a refill on your cardiac medications before your next appointment, please call your pharmacy*   Follow-Up: Please keep your upcoming appointments!

## 2019-09-04 ENCOUNTER — Ambulatory Visit: Payer: Private Health Insurance - Indemnity | Admitting: Orthopaedic Surgery

## 2019-09-05 ENCOUNTER — Ambulatory Visit: Payer: Private Health Insurance - Indemnity | Admitting: Physician Assistant

## 2019-09-05 ENCOUNTER — Other Ambulatory Visit (HOSPITAL_COMMUNITY): Payer: Private Health Insurance - Indemnity

## 2019-09-06 ENCOUNTER — Encounter: Payer: Self-pay | Admitting: Orthopaedic Surgery

## 2019-09-06 ENCOUNTER — Ambulatory Visit (INDEPENDENT_AMBULATORY_CARE_PROVIDER_SITE_OTHER): Payer: Medicare Other | Admitting: Orthopaedic Surgery

## 2019-09-06 ENCOUNTER — Other Ambulatory Visit: Payer: Self-pay

## 2019-09-06 ENCOUNTER — Ambulatory Visit (INDEPENDENT_AMBULATORY_CARE_PROVIDER_SITE_OTHER): Payer: Medicare Other

## 2019-09-06 DIAGNOSIS — I70233 Atherosclerosis of native arteries of right leg with ulceration of ankle: Secondary | ICD-10-CM | POA: Diagnosis not present

## 2019-09-06 DIAGNOSIS — S72002D Fracture of unspecified part of neck of left femur, subsequent encounter for closed fracture with routine healing: Secondary | ICD-10-CM

## 2019-09-06 NOTE — Progress Notes (Signed)
Office Visit Note   Patient: Michaela Simpson           Date of Birth: 1931/08/28           MRN: [id number] Visit Date: 09/06/2019              Requested by: Celene Squibb, MD [place],  [place] [address] PCP: Celene Squibb, MD   Assessment & Plan: Visit Diagnoses:  1. 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  At this point the recommendations would be from a surgical standpoint removing the cannulated screws and converting this to a hip replacement which would significantly decrease her pain and improve her ambulation and mobility.  Obviously, she would need cardiac clearance for surgery such as this.  We would like this to be done under spinal anesthesia so she would need to be off of Plavix for 6 to 7 days.  The family will discuss this amongst themselves and talk with cardiology as well.  I will see her back in 3 weeks to talk about this further.  The nonsurgical treatment would be significant limitations to her mobility and weightbearing on that left lower extremity.  No x-rays are needed at her next visit.  All questions and concerns were answered and addressed.  Follow-Up Instructions: Return in about 3 weeks (around 09/27/2019).   Orders:  Orders Placed This Encounter  Procedures  . XR HIP UNILAT W OR W/O PELVIS 1V LEFT   No orders of the defined types were placed in this encounter.     Procedures: No procedures performed   Clinical Data: No additional findings.   Subjective: Chief Complaint  Patient presents with  . Left Hip - Follow-up  The patient is now 6 months out from cannulated screw fixation of a nondisplaced left hip femoral neck fracture.  She ambulates with a cane and does report significant left hip pain.  At her last visit I was concerned that she was developing posttraumatic  osteonecrosis of the femoral head from this femoral neck fracture.  A month ago she did have a heart valve procedure.  She is on a baby aspirin and Plavix.  Her family is with her today.  She states that her quality of life is gotten significantly worse due to her limited mobility from her significant left hip pain.  HPI  Review of Systems She currently denies any headache or shortness of breath.  She denies any chest pain, fever, chills, nausea, vomiting  Objective: Vital Signs: There were no vitals taken for this visit.  Physical Exam She is alert and orient x3 and in no acute distress.  She follows exam appropriately and commands appropriately Ortho Exam Examination of her right hip is normal.  This hip had surgery for a intertrochanteric fracture in 2016.  It moves smoothly and fluidly.  Examination of her left more recent operative hip shows severe pain with any attempts of rotation of the hip. Specialty Comments:  No specialty comments available.  Imaging: XR HIP UNILAT W OR W/O PELVIS 1V LEFT  Result Date: 09/06/2019 2 views of the left hip show worsening osteonecrosis status post cannulated screw fixation of a nondisplaced femoral neck fracture.    PMFS History: Patient Active Problem List   Diagnosis Date Noted  . S/P TAVR (transcatheter aortic valve replacement) 08/08/2019  .  Severe aortic stenosis 07/19/2019  . Essential hypertension, benign 03/15/2019  . Sequelae of open wound of right lower extremity 03/15/2019  . Major depression, recurrent, chronic ([place]) 03/15/2019  . Chronic constipation 03/15/2019  . Closed left humeral fracture 03/08/2019  . Wound of right leg, initial encounter 03/08/2019  . Leukocytosis 03/08/2019  . Fracture of femoral neck, left, closed ([place]) 03/08/2019  . AKI (acute kidney injury) ([place]) 03/08/2019  . Depression   . HTN (hypertension)   . Rash and nonspecific skin eruption 04/06/2015  . Acute blood loss anemia 03/22/2015  . Edema  03/21/2015  . Acute renal failure syndrome ([place])   . Closed right hip fracture ([place]) 03/14/2015  . Rheumatoid arthritis ([place]) 03/14/2015  . Fall 03/14/2015   Past Medical History:  Diagnosis Date  . AKI (acute kidney injury) ([place])   . Anemia   . Aortic stenosis   . Depression   . Hip fracture ([place])   . HTN (hypertension)   . Humerus fracture   . Leukocytosis   . Osteoporosis   . Peripheral vascular disease ([place])    blood clot right leg  . Rheumatoid arthritis ([place])   . Ulcer of lower extremity ([facility])     Family History  Problem Relation Age of Onset  . Cancer Sister     Past Surgical History:  Procedure Laterality Date  . ABDOMINAL AORTOGRAM W/LOWER EXTREMITY N/A 05/08/2019   Procedure: ABDOMINAL AORTOGRAM W/LOWER EXTREMITY;  Surgeon: Serafina Mitchell, MD;  Location: [facility];  Service: Cardiovascular;  Laterality: N/A;  . HIP PINNING,CANNULATED Left 03/09/2019   Procedure: CANNULATED HIP PINNING;  Surgeon: Mcarthur Rossetti, MD;  Location: [facility];  Service: Orthopedics;  Laterality: Left;  . INTRAMEDULLARY (IM) NAIL INTERTROCHANTERIC Right 03/14/2015   Procedure: RIGHT INTERTROCHANTRIC INTRAMEDULLARY (IM) NAIL ;  Surgeon: Mcarthur Rossetti, MD;  Location: [facility];  Service: Orthopedics;  Laterality: Right;  . PERIPHERAL VASCULAR INTERVENTION Right 05/08/2019   Procedure: PERIPHERAL VASCULAR INTERVENTION;  Surgeon: Serafina Mitchell, MD;  Location: [facility];  Service: Cardiovascular;  Laterality: Right;  . RIGHT/LEFT HEART CATH AND CORONARY ANGIOGRAPHY N/A 07/19/2019   Procedure: RIGHT/LEFT HEART CATH AND CORONARY ANGIOGRAPHY;  Surgeon: Sherren Mocha, MD;  Location: [facility];  Service: Cardiovascular;  Laterality: N/A;  . TEE WITHOUT CARDIOVERSION N/A 08/07/2019   Procedure: TRANSESOPHAGEAL ECHOCARDIOGRAM (TEE);  Surgeon: Sherren Mocha, MD;  Location: [facility];  Service: Open Heart Surgery;  Laterality: N/A;  . TRANSCATHETER AORTIC  VALVE REPLACEMENT, TRANSFEMORAL  08/07/2019  . TRANSCATHETER AORTIC VALVE REPLACEMENT, TRANSFEMORAL N/A 08/07/2019   Procedure: TRANSCATHETER AORTIC VALVE REPLACEMENT, TRANSFEMORAL;  Surgeon: Sherren Mocha, MD;  Location: [facility];  Service: Open Heart Surgery;  Laterality: N/A;   Social History   Occupational History  . Not on file  Tobacco Use  . Smoking status: Never Smoker  . Smokeless tobacco: Never Used  Substance and Sexual Activity  . Alcohol use: No  . Drug use: No  . Sexual activity: Not on file

## 2019-09-10 ENCOUNTER — Other Ambulatory Visit: Payer: Self-pay

## 2019-09-10 ENCOUNTER — Ambulatory Visit (HOSPITAL_COMMUNITY)
Admission: RE | Admit: 2019-09-10 | Discharge: 2019-09-10 | Disposition: A | Discharge status: Home / Self Care | Payer: Medicare Other | Source: Ambulatory Visit | Attending: Surgery | Admitting: Surgery

## 2019-09-10 ENCOUNTER — Ambulatory Visit (INDEPENDENT_AMBULATORY_CARE_PROVIDER_SITE_OTHER): Payer: Medicare Other | Admitting: Physician Assistant

## 2019-09-10 ENCOUNTER — Ambulatory Visit (INDEPENDENT_AMBULATORY_CARE_PROVIDER_SITE_OTHER)
Admission: RE | Admit: 2019-09-10 | Discharge: 2019-09-10 | Disposition: A | Discharge status: Home / Self Care | Payer: Medicare Other | Source: Ambulatory Visit | Attending: Surgery | Admitting: Surgery

## 2019-09-10 ENCOUNTER — Encounter: Payer: Self-pay | Admitting: Orthopaedic Surgery

## 2019-09-10 VITALS — BP 131/69 | HR 80 | Temp 97.8°F | Resp 14 | Ht 60.0 in | Wt 104.0 lb

## 2019-09-10 DIAGNOSIS — I70233 Atherosclerosis of native arteries of right leg with ulceration of ankle: Secondary | ICD-10-CM | POA: Diagnosis not present

## 2019-09-10 NOTE — Progress Notes (Signed)
Office Note     CC:  follow up Requesting Provider:  Celene Squibb, MD  HPI: Michaela Simpson is a 86 y.o. (Jan 06, 1932) female who presents for follow up of her peripheral vascular disease with hx of right lower extremity ulceration.She is s/p abdominal aortogram, bilateral lower extremity runoff. Stent of right superficial femoral- popliteal artery. Mechanical Thrombectomy of the right superficial femoral -popliteal artery using the penumbra CAT 6 device. Mynx closure device of left femoral artery 05/08/2019 by Dr. Trula Slade for non healing right leg ulceration.  At follow up today she has been  doing well from vascular standpoint. She denies any claudication symptoms, rest pain, or non healing wounds. Her right leg ulceration is almost completely healed with just a pinpoint scab present  She was last seen on 06/11/19 by Dr. Trula Slade at which time she was doing well with patent stent on duplex. She was having some left groin pain and swelling in her right leg post intervention but it had improved.  She is additionally s/p Transcatheter aortic valve replacement- percutaneous right transfemoral approach on 08/07/19 by Dr. Cyndia Bent and Dr. Burt Knack due to severe aortic stenosis  She was just seen on 09/16/19 by Orthopedics because of continued left hip pain that makes ambulating very difficult. She was found to have worsening post traumatic osteonecrosis of her femoral head of her left hip. Surgery for hip replacement is being discussed amongst patient and her family. She is suppose to follow up with Dr. Ninfa Linden in 3 weeks to re discuss these plans  The pt is on a statin for cholesterol management.  The pt is on a daily aspirin.   Other AC: Plavix The pt is on Amlodipine for hypertension.   The pt is not diabetic. Tobacco hx:  Never  Past Medical History:  Diagnosis Date  . AKI (acute kidney injury) ([place])   . Anemia   . Aortic stenosis   . Depression   . Hip fracture ([place])   . HTN  (hypertension)   . Humerus fracture   . Leukocytosis   . Osteoporosis   . Peripheral vascular disease ([place])    blood clot right leg  . Rheumatoid arthritis ([place])   . Ulcer of lower extremity [facility])     Past Surgical History:  Procedure Laterality Date  . ABDOMINAL AORTOGRAM W/LOWER EXTREMITY N/A 05/08/2019   Procedure: ABDOMINAL AORTOGRAM W/LOWER EXTREMITY;  Surgeon: Serafina Mitchell, MD;  Location: [facility];  Service: Cardiovascular;  Laterality: N/A;  . HIP PINNING,CANNULATED Left 03/09/2019   Procedure: CANNULATED HIP PINNING;  Surgeon: Mcarthur Rossetti, MD;  Location: [facility];  Service: Orthopedics;  Laterality: Left;  . INTRAMEDULLARY (IM) NAIL INTERTROCHANTERIC Right 03/14/2015   Procedure: RIGHT INTERTROCHANTRIC INTRAMEDULLARY (IM) NAIL ;  Surgeon: Mcarthur Rossetti, MD;  Location: [facility];  Service: Orthopedics;  Laterality: Right;  . PERIPHERAL VASCULAR INTERVENTION Right 05/08/2019   Procedure: PERIPHERAL VASCULAR INTERVENTION;  Surgeon: Serafina Mitchell, MD;  Location: [facility];  Service: Cardiovascular;  Laterality: Right;  . RIGHT/LEFT HEART CATH AND CORONARY ANGIOGRAPHY N/A 07/19/2019   Procedure: RIGHT/LEFT HEART CATH AND CORONARY ANGIOGRAPHY;  Surgeon: Sherren Mocha, MD;  Location: [facility];  Service: Cardiovascular;  Laterality: N/A;  . TEE WITHOUT CARDIOVERSION N/A 08/07/2019   Procedure: TRANSESOPHAGEAL ECHOCARDIOGRAM (TEE);  Surgeon: Sherren Mocha, MD;  Location: [facility];  Service: Open Heart Surgery;  Laterality: N/A;  . TRANSCATHETER AORTIC VALVE REPLACEMENT, TRANSFEMORAL  08/07/2019  . TRANSCATHETER  AORTIC VALVE REPLACEMENT, TRANSFEMORAL N/A 08/07/2019   Procedure: TRANSCATHETER AORTIC VALVE REPLACEMENT, TRANSFEMORAL;  Surgeon: Sherren Mocha, MD;  Location: [facility];  Service: Open Heart Surgery;  Laterality: N/A;    Social History   Socioeconomic History  . Marital status: Widowed    Spouse name: Not  on file  . Number of children: Not on file  . Years of education: Not on file  . Highest education level: Not on file  Occupational History  . Not on file  Tobacco Use  . Smoking status: Never Smoker  . Smokeless tobacco: Never Used  Substance and Sexual Activity  . Alcohol use: No  . Drug use: No  . Sexual activity: Not on file  Other Topics Concern  . Not on file  Social History Narrative  . Not on file   Social Determinants of Health   Financial Resource Strain: Low Risk   . Difficulty of Paying Living Expenses: Not hard at all  Food Insecurity: No Food Insecurity  . Worried About Charity fundraiser in the Last Year: Never true  . Ran Out of Food in the Last Year: Never true  Transportation Needs: No Transportation Needs  . Lack of Transportation (Medical): No  . Lack of Transportation (Non-Medical): No  Physical Activity: Unknown  . Days of Exercise per Week: Patient refused  . Minutes of Exercise per Session: Patient refused  Stress: No Stress Concern Present  . Feeling of Stress : Not at all  Social Connections: Unknown  . Frequency of Communication with Friends and Family: Patient refused  . Frequency of Social Gatherings with Friends and Family: Patient refused  . Attends Religious Services: Patient refused  . Active Member of Clubs or Organizations: Patient refused  . Attends Archivist Meetings: Patient refused  . Marital Status: Patient refused  Intimate Partner Violence: Unknown  . Fear of Current or Ex-Partner: Patient refused  . Emotionally Abused: Patient refused  . Physically Abused: Patient refused  . Sexually Abused: Patient refused    Family History  Problem Relation Age of Onset  . Cancer Sister     Current Outpatient Medications  Medication Sig Dispense Refill  . acetaminophen (TYLENOL) 325 MG tablet Take 2 tablets (650 mg total) by mouth every 6 (six) hours as needed for mild pain (or Fever >/= 101). (Patient taking differently:  Take 325 mg by mouth every 4 (four) hours as needed for mild pain (or Fever >/= 101). )    . amLODipine (NORVASC) 2.5 MG tablet Take 1 tablet (2.5 mg total) by mouth at bedtime. 30 tablet 0  . aspirin EC 81 MG tablet Take 81 mg by mouth daily.    . [place]-Glucosamine-Vit D (OSTEO BI-FLEX ONE PER DAY PO) Take 1 tablet by mouth daily.    . clindamycin (CLEOCIN) 300 MG capsule Take 2 capsules (600mg ) 1 hour prior to all dental visits. 4 capsule 11  . clopidogrel (PLAVIX) 75 MG tablet Take 1 tablet (75 mg total) by mouth daily. 30 tablet 11  . Lactobacillus (PROBIOTIC ACIDOPHILUS PO) Take 1 tablet by mouth daily.    Marland Kitchen lactose free nutrition (BOOST) LIQD Take 237 mLs by mouth 3 (three) times daily.     Marland Kitchen LORazepam (ATIVAN) 0.5 MG tablet Take 0.5 mg by mouth at bedtime.     . Multiple Vitamins-Minerals (MULTIVITAMIN WITH MINERALS) tablet Take 1 tablet by mouth daily.    . Polyethylene Glycol 3350 (MIRALAX PO) Take 17 g by mouth daily.     Marland Kitchen  rosuvastatin (CRESTOR) 10 MG tablet Take 1 tablet (10 mg total) by mouth at bedtime. 30 tablet 11  . sertraline (ZOLOFT) 100 MG tablet Take 150 mg by mouth daily.     . vitamin B-12 (CYANOCOBALAMIN) 1000 MCG tablet Take 1,000 mcg by mouth daily.     No current facility-administered medications for this visit.    Allergies  Allergen Reactions  . Penicillins Nausea And Vomiting and Other (See Comments)    Child hood allergy - Flu symptoms; constant vomiting  Did it involve swelling of the face/tongue/throat, SOB, or low BP? Unknown Did it involve sudden or severe rash/hives, skin peeling, or any reaction on the inside of your mouth or nose? Unknown Did you need to seek medical attention at a hospital or doctor's office? Unknown When did it last happen?50+ years If all above answers are "NO", may proceed with cephalosporin use.   . Codeine Nausea And Vomiting and Rash    INTOLERANCE >  VOMITING     REVIEW OF SYSTEMS:  Review of Systems    Constitutional: Negative for fever and malaise/fatigue.  HENT: Negative for congestion and sore throat.   Eyes: Negative for blurred vision.  Respiratory: Negative for cough and shortness of breath.   Cardiovascular: Negative for chest pain, palpitations and leg swelling.  Gastrointestinal: Negative for abdominal pain, constipation, diarrhea, nausea and vomiting.  Genitourinary: Negative for dysuria.  Musculoskeletal: Positive for joint pain (left hip).  Neurological: Negative for dizziness, weakness and headaches.  Endo/Heme/Allergies: Bruises/bleeds easily.    PHYSICAL EXAMINATION:  Vitals:   09/10/19 1351  BP: 131/69  Pulse: 80  Resp: 14  Temp: 97.8 F (36.6 C)  TempSrc: Temporal  SpO2: 97%  Weight: 104 lb (47.2 kg)  Height: 5' (1.524 m)    General:  Elderly, very pleasant, not in any acute distress; vital signs documented above Gait: ambulates with cane HENT: WNL, normocephalic Pulmonary: normal non-labored breathing , without Rales, rhonchi,  wheezing Cardiac: regular HR, without  Murmurs without carotid bruit Abdomen: soft, NT, no masses Skin: without rashes Vascular Exam/Pulses: 2+ femoral pulses bilaterally, 2+ popliteal pulses, palpable DP/PT pulses bilaterally. Both feet warm. Motor and sensory intact Extremities: without ischemic changes, without Gangrene , without cellulitis; without open wounds;   Right leg ulceration, almost completely healed. Small eschar present Musculoskeletal: no muscle wasting or atrophy  Neurologic: A&O X 3;  No focal weakness or paresthesias are detected Psychiatric:  The pt has Normal affect.   Non-Invasive Vascular Imaging:   09/10/19 ABI /TBI's: +-------+-----------+-----------+------------+------------+  ABI/TBIToday's ABIToday's TBIPrevious ABIPrevious TBI  +-------+-----------+-----------+------------+------------+  Right 1.27    0.78    1.29    0.81       +-------+-----------+-----------+------------+------------+  Left  1.11    0.73    1.10    0.89      +-------+-----------+-----------+------------+------------+   Summary:  Right: Resting right ankle-brachial index is within normal range. No  evidence of significant right lower extremity arterial disease. The right  toe-brachial index is normal.   Left: Resting left ankle-brachial index is within normal range. No  evidence of significant left lower extremity arterial disease. The left  toe-brachial index is normal.   VAS [place] lower extremity bypass graft duplex 09/10/19 Right Stent(s):superficial femoral-popliteal artery  +---------------+---++---------++  Prox to Stent 162triphasic  +---------------+---++---------++  Proximal Stent 68 biphasic   +---------------+---++---------++  Mid Stent   70 biphasic   +---------------+---++---------++  Distal Stent  102biphasic   +---------------+---++---------++  Distal to Stent104triphasic  +---------------+---++---------++  ASSESSMENT/PLAN:: 86 y.o. female here for follow up for peripheral vascular disease. Well perfused bilateral lower extremities with palpable DP/PT pulses. Right lower extremity wound essentially healed. Non invasive vascular studies today are essentially unchanged. Right superficial femoral-popliteal stent appears patent. -She is likely anticipating having a left total hip replacement that will require being off of Plavix for 7 days prior. I advise patient and her daughter who was present with her at this visit that from vascular standpoint she is okay to hold her Plavix and/or Aspirin. She has follow up with Dr. Cyndia Bent on Thursday 09/13/19 and I advised them that they need to discuss holding the Aspirin/ Plavix with him - She otherwise will continue her Aspirin, Plavix and Statin - I advised earlier follow up should she develop claudication symptoms, rest pain or  new/worsening ulceration of lower extremities - She will  follow up in 3 months with ABI's and right lower extremity arterial duplex   Karoline Caldwell, PA-C [facility] [phone]  Clinic MD:   Dr. Trula Slade

## 2019-09-11 ENCOUNTER — Other Ambulatory Visit: Payer: Self-pay | Admitting: Orthopaedic Surgery

## 2019-09-11 ENCOUNTER — Other Ambulatory Visit: Payer: Self-pay | Admitting: *Deleted

## 2019-09-11 DIAGNOSIS — I350 Nonrheumatic aortic (valve) stenosis: Secondary | ICD-10-CM

## 2019-09-11 DIAGNOSIS — I70233 Atherosclerosis of native arteries of right leg with ulceration of ankle: Secondary | ICD-10-CM

## 2019-09-11 MED ORDER — TRAMADOL HCL 50 MG PO TABS: 50.0000 mg | ORAL_TABLET | Freq: Four times a day (QID) | ORAL | 0 refills | Status: DC | PRN

## 2019-09-13 ENCOUNTER — Encounter: Payer: Self-pay | Admitting: Physician Assistant

## 2019-09-13 ENCOUNTER — Other Ambulatory Visit: Payer: Self-pay | Admitting: Physician Assistant

## 2019-09-13 ENCOUNTER — Other Ambulatory Visit: Payer: Self-pay

## 2019-09-13 ENCOUNTER — Ambulatory Visit (INDEPENDENT_AMBULATORY_CARE_PROVIDER_SITE_OTHER): Payer: Medicare Other | Admitting: Physician Assistant

## 2019-09-13 ENCOUNTER — Ambulatory Visit (HOSPITAL_COMMUNITY): Payer: Medicare Other | Attending: Cardiology

## 2019-09-13 VITALS — BP 130/82 | HR 74 | Ht 60.0 in | Wt 102.1 lb

## 2019-09-13 DIAGNOSIS — M25559 Pain in unspecified hip: Secondary | ICD-10-CM | POA: Diagnosis not present

## 2019-09-13 DIAGNOSIS — R911 Solitary pulmonary nodule: Secondary | ICD-10-CM | POA: Diagnosis not present

## 2019-09-13 DIAGNOSIS — I712 Thoracic aortic aneurysm, without rupture, unspecified: Secondary | ICD-10-CM

## 2019-09-13 DIAGNOSIS — I350 Nonrheumatic aortic (valve) stenosis: Secondary | ICD-10-CM | POA: Insufficient documentation

## 2019-09-13 DIAGNOSIS — Z952 Presence of prosthetic heart valve: Secondary | ICD-10-CM | POA: Insufficient documentation

## 2019-09-13 DIAGNOSIS — I2510 Atherosclerotic heart disease of native coronary artery without angina pectoris: Secondary | ICD-10-CM | POA: Diagnosis not present

## 2019-09-13 DIAGNOSIS — I70233 Atherosclerosis of native arteries of right leg with ulceration of ankle: Secondary | ICD-10-CM

## 2019-09-13 DIAGNOSIS — I739 Peripheral vascular disease, unspecified: Secondary | ICD-10-CM

## 2019-09-13 NOTE — Patient Instructions (Addendum)
Medication Instructions:  1) You may STOP PLAVIX when you run out of pills in August  *If you need a refill on your cardiac medications before your next appointment, please call your pharmacy*  Testing: Joellen Jersey recommends you have a chest CT in 3 months.     Follow-Up: You will be called to arrange your follow-up visit with Dr. Burt Knack when his scheduled is available for July.   We will contact you to arrange your one year TAVR echo and office visit!

## 2019-09-13 NOTE — Progress Notes (Signed)
[facility] [name]                                       Cardiology Office Note    Date:  09/13/2019   ID:  Michaela Simpson 07-07-1931, MRN [id number]  PCP:  Michaela Squibb, MD  Cardiologist: Dr. Burt Simpson  CC: 1 month s/p TAVR  History of Present Illness:  Michaela Simpson is a 86 y.o. female with a history of HTN, rheumatoid arthritis, anemia, CAD, PAD with nonhealing ulcer s/p right superficial femoral-popliteal artery stenting (04/2019) and severe AS s/p TAVR (08/07/19) who presents to clinic for follow up.  She was hospitalized in 02/2019 for a mechanical fall resulting in a left hip fracture and left humerus fracture. She underwent ORIF of the hip fracture and nonoperative treatment of the humerus fracture was recommended. During her hospitalization she was noted to have a heart murmur and an echocardiogram was ordered. This demonstrated severe aortic stenosis and she is referred for outpatient consultation to review treatment options.   She was seen by Dr. Burt Simpson in the office on 04/12/2019 and felt to be a good candidate for TAVR but she requested some more time to recover from her recent hip/humerus fracture. She was seen by Dr. Trula Simpson for a non healing ulcer of her right leg. She underwent mechanical thrombectomy and stenting of the right superficial femoral-popliteal artery on 05/08/19. Placed on DAPT with aspirin and plavix. [facility] 07/19/19 showed severe single vessel CAD involving a moderate caliber (2 mm) first diagonal branch of the LAD, otherwise widely patent left main, LAD, LCx, and RCA as well as severe calcific AS with mean gradient 50 mmHg and AVA 0.56 square cm.  She underwent successful TAVR with a 23 mm Edwards S3U on 08/07/19. Post op echo showed EF 60% with a normally functioning TAVR with a mean gradient of 13 mm Hg and no PVL. She was discharged on aspirin and plavix.  Today she presents to clinic for follow up.  Here with her daughter. She is doing great aside from her severe hip pain.  She was recently seen by orthopedics because of continued left hip pain that makes ambulation very difficult.  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  She wants to have surgery on this as soon as possible. No CP or SOB. No LE edema, orthopnea or PND. No dizziness or syncope. No blood in stool or urine. No palpitations.    Past Medical History:  Diagnosis Date  . AKI (acute kidney injury) ([place])   . Anemia   . Aortic stenosis   . Depression   . Hip fracture ([place])   . HTN (hypertension)   . Humerus fracture   . Leukocytosis   . Osteoporosis   . Peripheral vascular disease ([place])    blood clot right leg  . Rheumatoid arthritis ([place])   . Ulcer of lower extremity [facility])     Past Surgical History:  Procedure Laterality Date  . ABDOMINAL AORTOGRAM W/LOWER EXTREMITY N/A 05/08/2019   Procedure: ABDOMINAL AORTOGRAM W/LOWER EXTREMITY;  Surgeon: Michaela Mitchell, MD;  Location: [place] [facility];  Service: Cardiovascular;  Laterality: N/A;  . HIP PINNING,CANNULATED Left 03/09/2019   Procedure: CANNULATED HIP PINNING;  Surgeon: Michaela Rossetti, MD;  Location: [place];  Service: Orthopedics;  Laterality: Left;  . INTRAMEDULLARY (IM) NAIL INTERTROCHANTERIC Right 03/14/2015   Procedure: RIGHT INTERTROCHANTRIC INTRAMEDULLARY (IM) NAIL ;  Surgeon: Michaela Rossetti, MD;  Location: [place];  Service: Orthopedics;  Laterality: Right;  . PERIPHERAL VASCULAR INTERVENTION Right 05/08/2019   Procedure: PERIPHERAL VASCULAR INTERVENTION;  Surgeon: Michaela Mitchell, MD;  Location: [place] [facility];  Service: Cardiovascular;  Laterality: Right;  . RIGHT/LEFT HEART CATH AND CORONARY ANGIOGRAPHY N/A 07/19/2019   Procedure: RIGHT/LEFT HEART CATH AND CORONARY ANGIOGRAPHY;  Surgeon: Michaela Mocha, MD;  Location: [place] [facility];  Service: Cardiovascular;  Laterality: N/A;  .  TEE WITHOUT CARDIOVERSION N/A 08/07/2019   Procedure: TRANSESOPHAGEAL ECHOCARDIOGRAM (TEE);  Surgeon: Michaela Mocha, MD;  Location: [place] [facility];  Service: Open Heart Surgery;  Laterality: N/A;  . TRANSCATHETER AORTIC VALVE REPLACEMENT, TRANSFEMORAL  08/07/2019  . TRANSCATHETER AORTIC VALVE REPLACEMENT, TRANSFEMORAL N/A 08/07/2019   Procedure: TRANSCATHETER AORTIC VALVE REPLACEMENT, TRANSFEMORAL;  Surgeon: Michaela Mocha, MD;  Location: [place] [facility];  Service: Open Heart Surgery;  Laterality: N/A;    Current Medications: Outpatient Medications Prior to Visit  Medication Sig Dispense Refill  . acetaminophen (TYLENOL) 325 MG tablet Take 2 tablets (650 mg total) by mouth every 6 (six) hours as needed for mild pain (or Fever >/= 101).    Marland Kitchen amLODipine (NORVASC) 2.5 MG tablet Take 1 tablet (2.5 mg total) by mouth at bedtime. 30 tablet 0  . aspirin EC 81 MG tablet Take 81 mg by mouth daily.    . [place]-Glucosamine-Vit D (OSTEO BI-FLEX ONE PER DAY PO) Take 1 tablet by mouth daily.    . clindamycin (CLEOCIN) 300 MG capsule Take 2 capsules (600mg ) 1 hour prior to all dental visits. 4 capsule 11  . clopidogrel (PLAVIX) 75 MG tablet Take 1 tablet (75 mg total) by mouth daily. 30 tablet 11  . Lactobacillus (PROBIOTIC ACIDOPHILUS PO) Take 1 tablet by mouth daily.    Marland Kitchen lactose free nutrition (BOOST) LIQD Take 237 mLs by mouth 3 (three) times daily.     Marland Kitchen LORazepam (ATIVAN) 0.5 MG tablet Take 0.5 mg by mouth at bedtime.     . Multiple Vitamins-Minerals (MULTIVITAMIN WITH MINERALS) tablet Take 1 tablet by mouth daily.    . Polyethylene Glycol 3350 (MIRALAX PO) Take 17 g by mouth daily.     . rosuvastatin (CRESTOR) 10 MG tablet Take 1 tablet (10 mg total) by mouth at bedtime. 30 tablet 11  . sertraline (ZOLOFT) 100 MG tablet Take 150 mg by mouth daily.     . traMADol (ULTRAM) 50 MG tablet Take 1 tablet (50 mg total) by mouth every 6 (six) hours as needed. 40 tablet 0  . vitamin B-12  (CYANOCOBALAMIN) 1000 MCG tablet Take 1,000 mcg by mouth daily.     No facility-administered medications prior to visit.     Allergies:   Penicillins and Codeine   Social History   Socioeconomic History  . Marital status: Widowed    Spouse name: Not on file  . Number of children: Not on file  . Years of education: Not on file  . Highest education level: Not on file  Occupational History  . Not on file  Tobacco Use  . Smoking status: Never Smoker  . Smokeless tobacco: Never Used  Substance and Sexual Activity  . Alcohol use: No  . Drug use: No  . Sexual activity: Not on file  Other Topics Concern  . Not on file  Social History Narrative  .  Not on file   Social Determinants of Health   Financial Resource Strain: Low Risk   . Difficulty of Paying Living Expenses: Not hard at all  Food Insecurity: No Food Insecurity  . Worried About Charity fundraiser in the Last Year: Never true  . Ran Out of Food in the Last Year: Never true  Transportation Needs: No Transportation Needs  . Lack of Transportation (Medical): No  . Lack of Transportation (Non-Medical): No  Physical Activity: Unknown  . Days of Exercise per Week: Patient refused  . Minutes of Exercise per Session: Patient refused  Stress: No Stress Concern Present  . Feeling of Stress : Not at all  Social Connections: Unknown  . Frequency of Communication with Friends and Family: Patient refused  . Frequency of Social Gatherings with Friends and Family: Patient refused  . Attends Religious Services: Patient refused  . Active Member of Clubs or Organizations: Patient refused  . Attends Archivist Meetings: Patient refused  . Marital Status: Patient refused     Family History:  The patient's family history includes Cancer in her sister.     ROS:   Please see the history of present illness.    ROS All other systems reviewed and are negative.   PHYSICAL EXAM:   VS:  BP 130/82   Pulse 74   Ht 5' (1.524  m)   Wt 102 lb 1.9 oz (46.3 kg)   SpO2 98%   BMI 19.94 kg/m    GEN: Well nourished, well developed, in no acute distress HEENT: normal Neck: no JVD or masses Cardiac: RRR; no murmurs, rubs, or gallops,no edema  Respiratory:  clear to auscultation bilaterally, normal work of breathing GI: soft, nontender, nondistended, + BS MS: no deformity or atrophy Skin: warm and dry, no rash. Neuro:  Alert and Oriented x 3, Strength and sensation are intact Psych: euthymic mood, full affect   Wt Readings from Last 3 Encounters:  09/13/19 102 lb 1.9 oz (46.3 kg)  09/10/19 104 lb (47.2 kg)  08/22/19 104 lb (47.2 kg)      Studies/Labs Reviewed:   EKG:  EKG is NOT ordered today.   Recent Labs: 08/03/2019: ALT 15; B Natriuretic Peptide 132.4 08/08/2019: BUN 28; Creatinine, Ser 0.82; Hemoglobin 11.0; Magnesium 2.1; Platelets 210; Potassium 4.1; Sodium 139   Lipid Panel No results found for: CHOL, TRIG, HDL, CHOLHDL, VLDL, LDLCALC, LDLDIRECT  Additional studies/ records that were reviewed today include:   TAVR OPERATIVE NOTE   Date of Procedure:                08/07/2019  Preoperative Diagnosis:      Severe Aortic Stenosis   Postoperative Diagnosis:    Same   Procedure:        Transcatheter Aortic Valve Replacement - Percutaneous Right Transfemoral Approach             Edwards Sapien 3 Ultra THV (size 23 mm, model # 9750TFX, serial # [id number])              Co-Surgeons:            Gaye Pollack, MD   and Michaela Mocha, MD   Anesthesiologist:                  Myrtie Soman, MD  Echocardiographer:              Jenkins Rouge, MD  Pre-operative Echo Findings: ? Severe aortic stenosis ? Normal left  ventricular systolic function  Post-operative Echo Findings: ? No paravalvular leak ? Normal left ventricular systolic function  __________________  Echo 08/08/19 IMPRESSIONS  1. Left ventricular ejection fraction, by estimation, is 60 to 65%. The  left ventricle has  normal function. The left ventricle has no regional  wall motion abnormalities. Left ventricular diastolic parameters are  consistent with Grade I diastolic dysfunction (impaired relaxation).  2. Right ventricular systolic function is normal. The right ventricular  size is normal.  3. Right atrial size was mildly dilated.  4. The mitral valve is degenerative. No evidence of mitral valve regurgitation.  5. Post TAVR with 23 mm Sapien 3 Ultra valve. Good position with no  significant PVL Compared to implant now post op day one peak velocity 1.5  m/sec->2.4, mean gradient 5 mmHg ->13 mmHg and AVA 2.0 cm2 -> 1.6 cm2  Current DVI is normal at 0.51 . The  aortic valve has been repaired/replaced. Aortic valve regurgitation is not visualized. There is a 23 mm Edwards Sapien prosthetic (TAVR) valve present in the aortic position. Procedure Date: 08/07/2019.  6. Pulmonic valve regurgitation not assessed.  7. Aortic dilatation noted. There is dilatation of the aortic root  measuring 42 mm.   ________________  Echo 09/13/19 IMPRESSIONS  1. Left ventricular ejection fraction, by estimation, is 60 to 65%. The left ventricle has normal function. The left ventricle has no regional wall motion abnormalities. Left ventricular diastolic parameters are consistent with Grade I diastolic  dysfunction (impaired relaxation). Elevated left ventricular end-diastolic pressure.  2. Right ventricular systolic function is normal. The right ventricular size is normal. Tricuspid regurgitation signal is inadequate for assessing PA pressure.  3. Left atrial size was moderately dilated.  4. The mitral valve is degenerative. Mild mitral annular calcification. No evidence of mitral valve regurgitation. No evidence of mitral stenosis.  5. The aortic valve has been repaired/replaced. There is a 23 mm Edwards Sapien prosthetic, stented (TAVR) valve present in the aortic position. Aortic valve mean gradient measures 9.0 mmHg.  Aortic valve peak gradient measures 16.3 mmHg. Aortic valve  area, by VTI measures 1.99 cm. DI is 0.57. There is trivial perivalvular AI.  6. Aortic dilatation noted. There is mild dilatation of the ascending aorta and of the aortic root measuring 45 mm and 42mm respectively.  7. The inferior vena cava is normal in size with greater than 50% respiratory variability, suggesting right atrial pressure of 3 mmHg.  8. Compared to prior echo 08/08/2019, the transvalvular aortic peak/mean gradients have improved from 23.6/13 to 16.3/60mmHg on current study. The ascending aortic measurement of 34mm is similar to findings on Chest CTA 07/2019 which measured 68mm.  ASSESSMENT & PLAN:   Severe AS s/p TAVR: echo today shows EF 60% with normally functioning TAVR with a mean gradient of 9 mm Hg and trivial AI. She has NYHA class I symptoms, but her ambulation is made very difficult due to severe left hip pain.  She will continue on DAPT with aspirin and Plavix.  She can stop her Plavix after 6 months of therapy (01/2020). She has clindamycin for SBE prophylaxis.  I will see her back in 1 year with echo and follow-up.  PAD s/p right superficial femoral artery stenting (05/08/19): continue on DAPT. Followed by Dr. Trula Simpson.   HTN: BP well controlled today.  No changes made.  CAD: pre TAVR cath showed severe single vessel CAD involving a moderate caliber (2 mm) first diagonal branch of the LAD, otherwise widely patent left main,  LAD, LCx, and RCA. Continue medical therapy.   Pulmonary nodule: nodular 1.6 cm pulmonary opacity adjacent to healed deformity in the lateral left eighth rib, favoring posttraumatic scarring. Suggest attention on follow-up chest CT in 3 months.  I will have this set up today.  TAA: pre TAVR CT scan showed an ectatic 4.4 cm ascending thoracic aorta.  This will need to be followed on an annual basis.  Hip pain: she was recently seen by orthopedics because of continued left hip pain that makes  ambulation very difficult.  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  She wants to have surgery on this as soon as possible.  She has an appointment with Dr. Ninfa Linden on 4/12. She is cleared from a cardiac standpoint to proceed with hip surgery with interruption of her dual antiplatelet therapy.  Medication Adjustments/Labs and Tests Ordered: Current medicines are reviewed at length with the patient today.  Concerns regarding medicines are outlined above.  Medication changes, Labs and Tests ordered today are listed in the Patient Instructions below. Patient Instructions  Medication Instructions:  1) You may STOP PLAVIX when you run out of pills in August  *If you need a refill on your cardiac medications before your next appointment, please call your pharmacy*  Testing: Joellen Jersey recommends you have a chest CT in 3 months.     Follow-Up: You will be called to arrange your follow-up visit with Dr. Burt Simpson when his scheduled is available for July.   We will contact you to arrange your one year TAVR echo and office visit!    Signed, Angelena Form, PA-C  09/13/2019 5:17 PM    [facility] [place], [place], [place]  [address] Phone: [phone]; Fax: [phone]

## 2019-09-17 ENCOUNTER — Ambulatory Visit: Payer: Self-pay

## 2019-09-17 ENCOUNTER — Encounter: Payer: Self-pay | Admitting: Family Medicine

## 2019-09-17 ENCOUNTER — Other Ambulatory Visit: Payer: Self-pay

## 2019-09-17 ENCOUNTER — Ambulatory Visit (INDEPENDENT_AMBULATORY_CARE_PROVIDER_SITE_OTHER): Payer: Medicare Other | Admitting: Family Medicine

## 2019-09-17 DIAGNOSIS — M79652 Pain in left thigh: Secondary | ICD-10-CM

## 2019-09-17 DIAGNOSIS — M25512 Pain in left shoulder: Secondary | ICD-10-CM

## 2019-09-17 MED ORDER — VITAMIN D-3 125 MCG (5000 UT) PO TABS: 1.0000 | ORAL_TABLET | Freq: Every day | ORAL | 3 refills | Status: DC

## 2019-09-17 MED ORDER — VITAMIN K2 100 MCG PO TABS: 100.0000 ug | ORAL_TABLET | Freq: Every day | ORAL | 3 refills | Status: DC

## 2019-09-17 MED ORDER — MAGNESIUM 400 MG PO CAPS: 400.0000 mg | ORAL_CAPSULE | Freq: Every day | ORAL | 1 refills | Status: DC

## 2019-09-17 NOTE — Progress Notes (Signed)
Office Visit Note   Patient: Michaela Simpson           Date of Birth: 1931-07-10           MRN: [id number] Visit Date: 09/17/2019 Requested by: Celene Squibb, MD [place],  [place] [id number] PCP: Celene Squibb, MD  Subjective: Chief Complaint  Patient presents with  . Left Thigh - Pain, Injury    Fell last night at home, hitting shoulder, ribs, hip, knee and head on the floor of the kitchen. She grabbed a chair, but it fell over with her. Did not have pain until this morning.   . Left Shoulder - Pain, Injury    HPI: She is here with left shoulder and thigh pain.  Last night at home she was in her kitchen, lost her balance and fell.  She hit her head but did not lose consciousness.  She had some bleeding from her forehead and some pain in her left shoulder and her thigh but she was able to get up and walk, able to eat her dinner and she thought she was going to be okay.  She took some tramadol before bed but she had a difficult time sleeping because of her pain so she now presents for evaluation.  She is status post left proximal humerus fracture last year and left hip fracture pinning last year.  She has continued to have left hip pain and is finally cleared from a cardiac standpoint to undergo hip replacement in the near future.               ROS:   All other systems were reviewed and are negative.  Objective: Vital Signs: There were no vitals taken for this visit.  Physical Exam:  General:  Alert and oriented, in no acute distress. Pulm:  Breathing unlabored. Psy:  Normal mood, congruent affect. Skin: There is bruising on her left forehead and cheek.  There is a scab above her left eyebrow.  She has bruising on the anterior and lateral shoulder and on the posterior and anterior left knee.  Her daughter states that there is quite a bit of bruising on her left lateral thigh as well. Left shoulder: She has limited active range of motion which is normal for her status  post previous fracture.  Tendon function seems to be intact.  She has some tenderness near the [facility] joint, some tenderness near the posterior subacromial space and some tenderness at the proximal humerus. Left hip: She has ability to actively flex her hip, adduct and AB duct against resistance.  She has pain with passive internal rotation and she is tender over the greater trochanter and a little bit tender behind the knee.  Imaging: X-rays left shoulder: Slightly superiorly displaced left distal clavicle fracture.  No sign of humerus fracture.  Alignment of the glenohumeral joint is anatomic.  X-rays left femur: I do not see a definite fracture in the femur.  Surgical hardware in the femur is intact.    Assessment & Plan: 1.  1 day status post fall with left distal clavicle fracture, acceptably aligned. -I will notify Dr. Ninfa Linden of this injury since she is planning to have her hip replaced in the near future.  At the present time she is able to use her walker still. -Shoulder sling for comfort, take the sling off daily to work on range of motion to prevent stiffness.  Return in about 3 to 4 weeks for  a clavicle x-ray.  2.  Left knee pain status post fall -Weightbearing as tolerated.  If symptoms persist, repeat x-rays.     Procedures: No procedures performed  No notes on file     PMFS History: Patient Active Problem List   Diagnosis Date Noted  . S/P TAVR (transcatheter aortic valve replacement) 08/08/2019  . Severe aortic stenosis 07/19/2019  . Essential hypertension, benign 03/15/2019  . Sequelae of open wound of right lower extremity 03/15/2019  . Major depression, recurrent, chronic ([place]) 03/15/2019  . Chronic constipation 03/15/2019  . Closed left humeral fracture 03/08/2019  . Wound of right leg, initial encounter 03/08/2019  . Leukocytosis 03/08/2019  . Fracture of femoral neck, left, closed ([place]) 03/08/2019  . AKI (acute kidney injury) ([place]) 03/08/2019  . Depression    . HTN (hypertension)   . Bilateral carpal tunnel syndrome 05/15/2018  . Rash and nonspecific skin eruption 04/06/2015  . Acute blood loss anemia 03/22/2015  . Edema 03/21/2015  . Acute renal failure syndrome ([place])   . Closed right hip fracture ([place]) 03/14/2015  . Rheumatoid arthritis ([place]) 03/14/2015  . Fall 03/14/2015   Past Medical History:  Diagnosis Date  . AKI (acute kidney injury) ([place])   . Anemia   . Aortic stenosis   . Depression   . Hip fracture ([place])   . HTN (hypertension)   . Humerus fracture   . Leukocytosis   . Osteoporosis   . Peripheral vascular disease ([place])    blood clot right leg  . Rheumatoid arthritis ([place])   . Ulcer of lower extremity ([facility])     Family History  Problem Relation Age of Onset  . Cancer Sister     Past Surgical History:  Procedure Laterality Date  . ABDOMINAL AORTOGRAM W/LOWER EXTREMITY N/A 05/08/2019   Procedure: ABDOMINAL AORTOGRAM W/LOWER EXTREMITY;  Surgeon: Serafina Mitchell, MD;  Location: [place] [facility];  Service: Cardiovascular;  Laterality: N/A;  . HIP PINNING,CANNULATED Left 03/09/2019   Procedure: CANNULATED HIP PINNING;  Surgeon: Mcarthur Rossetti, MD;  Location: [place];  Service: Orthopedics;  Laterality: Left;  . INTRAMEDULLARY (IM) NAIL INTERTROCHANTERIC Right 03/14/2015   Procedure: RIGHT INTERTROCHANTRIC INTRAMEDULLARY (IM) NAIL ;  Surgeon: Mcarthur Rossetti, MD;  Location: [place];  Service: Orthopedics;  Laterality: Right;  . PERIPHERAL VASCULAR INTERVENTION Right 05/08/2019   Procedure: PERIPHERAL VASCULAR INTERVENTION;  Surgeon: Serafina Mitchell, MD;  Location: [place] [facility];  Service: Cardiovascular;  Laterality: Right;  . RIGHT/LEFT HEART CATH AND CORONARY ANGIOGRAPHY N/A 07/19/2019   Procedure: RIGHT/LEFT HEART CATH AND CORONARY ANGIOGRAPHY;  Surgeon: Sherren Mocha, MD;  Location: [place] [facility];  Service: Cardiovascular;  Laterality: N/A;  . TEE WITHOUT CARDIOVERSION N/A 08/07/2019   Procedure:  TRANSESOPHAGEAL ECHOCARDIOGRAM (TEE);  Surgeon: Sherren Mocha, MD;  Location: [place] [facility];  Service: Open Heart Surgery;  Laterality: N/A;  . TRANSCATHETER AORTIC VALVE REPLACEMENT, TRANSFEMORAL  08/07/2019  . TRANSCATHETER AORTIC VALVE REPLACEMENT, TRANSFEMORAL N/A 08/07/2019   Procedure: TRANSCATHETER AORTIC VALVE REPLACEMENT, TRANSFEMORAL;  Surgeon: Sherren Mocha, MD;  Location: [place] [facility];  Service: Open Heart Surgery;  Laterality: N/A;   Social History   Occupational History  . Not on file  Tobacco Use  . Smoking status: Never Smoker  . Smokeless tobacco: Never Used  Substance and Sexual Activity  . Alcohol use: No  . Drug use: No  . Sexual activity: Not on file

## 2019-09-17 NOTE — Patient Instructions (Signed)
    Vitamin D3:  5,000 IU daily  Vitamin K2:  100 mcg daily  Magnesium:  200-400 mg daily    

## 2019-09-19 ENCOUNTER — Emergency Department (HOSPITAL_COMMUNITY): Payer: Medicare Other

## 2019-09-19 ENCOUNTER — Inpatient Hospital Stay (HOSPITAL_COMMUNITY)
Admission: EM | Admit: 2019-09-19 | Discharge: 2019-09-21 | DRG: 542 | Disposition: A | Discharge status: Home Health | Payer: Medicare Other | Attending: Family Medicine | Admitting: Family Medicine

## 2019-09-19 ENCOUNTER — Encounter (HOSPITAL_COMMUNITY): Payer: Self-pay | Admitting: *Deleted

## 2019-09-19 ENCOUNTER — Encounter: Payer: Self-pay | Admitting: Family Medicine

## 2019-09-19 ENCOUNTER — Other Ambulatory Visit: Payer: Self-pay

## 2019-09-19 DIAGNOSIS — S32502A Unspecified fracture of left pubis, initial encounter for closed fracture: Secondary | ICD-10-CM | POA: Diagnosis not present

## 2019-09-19 DIAGNOSIS — M80052A Age-related osteoporosis with current pathological fracture, left femur, initial encounter for fracture: Secondary | ICD-10-CM | POA: Diagnosis present

## 2019-09-19 DIAGNOSIS — Z79899 Other long term (current) drug therapy: Secondary | ICD-10-CM | POA: Diagnosis not present

## 2019-09-19 DIAGNOSIS — S32412A Displaced fracture of anterior wall of left acetabulum, initial encounter for closed fracture: Secondary | ICD-10-CM | POA: Diagnosis present

## 2019-09-19 DIAGNOSIS — I739 Peripheral vascular disease, unspecified: Secondary | ICD-10-CM | POA: Diagnosis present

## 2019-09-19 DIAGNOSIS — Z681 Body mass index (BMI) 19 or less, adult: Secondary | ICD-10-CM | POA: Diagnosis not present

## 2019-09-19 DIAGNOSIS — Z7982 Long term (current) use of aspirin: Secondary | ICD-10-CM

## 2019-09-19 DIAGNOSIS — M069 Rheumatoid arthritis, unspecified: Secondary | ICD-10-CM | POA: Diagnosis present

## 2019-09-19 DIAGNOSIS — S3210XA Unspecified fracture of sacrum, initial encounter for closed fracture: Secondary | ICD-10-CM | POA: Diagnosis not present

## 2019-09-19 DIAGNOSIS — W19XXXA Unspecified fall, initial encounter: Secondary | ICD-10-CM | POA: Diagnosis not present

## 2019-09-19 DIAGNOSIS — S5012XA Contusion of left forearm, initial encounter: Secondary | ICD-10-CM | POA: Diagnosis present

## 2019-09-19 DIAGNOSIS — Z66 Do not resuscitate: Secondary | ICD-10-CM | POA: Diagnosis present

## 2019-09-19 DIAGNOSIS — Z7902 Long term (current) use of antithrombotics/antiplatelets: Secondary | ICD-10-CM | POA: Diagnosis not present

## 2019-09-19 DIAGNOSIS — S79922A Unspecified injury of left thigh, initial encounter: Secondary | ICD-10-CM | POA: Diagnosis not present

## 2019-09-19 DIAGNOSIS — M80012A Age-related osteoporosis with current pathological fracture, left shoulder, initial encounter for fracture: Secondary | ICD-10-CM | POA: Diagnosis present

## 2019-09-19 DIAGNOSIS — I10 Essential (primary) hypertension: Secondary | ICD-10-CM | POA: Diagnosis present

## 2019-09-19 DIAGNOSIS — S32592A Other specified fracture of left pubis, initial encounter for closed fracture: Secondary | ICD-10-CM | POA: Diagnosis present

## 2019-09-19 DIAGNOSIS — R296 Repeated falls: Secondary | ICD-10-CM | POA: Diagnosis not present

## 2019-09-19 DIAGNOSIS — F329 Major depressive disorder, single episode, unspecified: Secondary | ICD-10-CM | POA: Diagnosis present

## 2019-09-19 DIAGNOSIS — Z20822 Contact with and (suspected) exposure to covid-19: Secondary | ICD-10-CM | POA: Diagnosis present

## 2019-09-19 DIAGNOSIS — M879 Osteonecrosis, unspecified: Secondary | ICD-10-CM | POA: Diagnosis present

## 2019-09-19 DIAGNOSIS — Z03818 Encounter for observation for suspected exposure to other biological agents ruled out: Secondary | ICD-10-CM | POA: Diagnosis not present

## 2019-09-19 DIAGNOSIS — S3993XA Unspecified injury of pelvis, initial encounter: Secondary | ICD-10-CM | POA: Diagnosis not present

## 2019-09-19 DIAGNOSIS — K5909 Other constipation: Secondary | ICD-10-CM | POA: Diagnosis present

## 2019-09-19 DIAGNOSIS — R102 Pelvic and perineal pain: Secondary | ICD-10-CM | POA: Diagnosis not present

## 2019-09-19 DIAGNOSIS — S42032A Displaced fracture of lateral end of left clavicle, initial encounter for closed fracture: Secondary | ICD-10-CM | POA: Diagnosis not present

## 2019-09-19 DIAGNOSIS — M25519 Pain in unspecified shoulder: Secondary | ICD-10-CM | POA: Diagnosis not present

## 2019-09-19 DIAGNOSIS — S32411A Displaced fracture of anterior wall of right acetabulum, initial encounter for closed fracture: Secondary | ICD-10-CM

## 2019-09-19 DIAGNOSIS — E43 Unspecified severe protein-calorie malnutrition: Secondary | ICD-10-CM | POA: Diagnosis present

## 2019-09-19 DIAGNOSIS — Z880 Allergy status to penicillin: Secondary | ICD-10-CM

## 2019-09-19 DIAGNOSIS — Z952 Presence of prosthetic heart valve: Secondary | ICD-10-CM

## 2019-09-19 DIAGNOSIS — S299XXA Unspecified injury of thorax, initial encounter: Secondary | ICD-10-CM | POA: Diagnosis not present

## 2019-09-19 DIAGNOSIS — S329XXA Fracture of unspecified parts of lumbosacral spine and pelvis, initial encounter for closed fracture: Secondary | ICD-10-CM | POA: Diagnosis present

## 2019-09-19 DIAGNOSIS — S3219XA Other fracture of sacrum, initial encounter for closed fracture: Secondary | ICD-10-CM | POA: Diagnosis not present

## 2019-09-19 DIAGNOSIS — F32A Depression, unspecified: Secondary | ICD-10-CM | POA: Diagnosis present

## 2019-09-19 DIAGNOSIS — R52 Pain, unspecified: Secondary | ICD-10-CM | POA: Diagnosis not present

## 2019-09-19 DIAGNOSIS — S2232XA Fracture of one rib, left side, initial encounter for closed fracture: Secondary | ICD-10-CM | POA: Diagnosis not present

## 2019-09-19 LAB — CBC WITH DIFFERENTIAL/PLATELET
Abs Immature Granulocytes: 0.19 10*3/uL — ABNORMAL HIGH (ref 0.00–0.07)
Basophils Absolute: 0 10*3/uL (ref 0.0–0.1)
Basophils Relative: 0 %
Eosinophils Absolute: 0 10*3/uL (ref 0.0–0.5)
Eosinophils Relative: 0 %
HCT: 41.8 % (ref 36.0–46.0)
Hemoglobin: 12.7 g/dL (ref 12.0–15.0)
Immature Granulocytes: 1 %
Lymphocytes Relative: 5 %
Lymphs Abs: 0.7 10*3/uL (ref 0.7–4.0)
MCH: 28.4 pg (ref 26.0–34.0)
MCHC: 30.4 g/dL (ref 30.0–36.0)
MCV: 93.5 fL (ref 80.0–100.0)
Monocytes Absolute: 0.7 10*3/uL (ref 0.1–1.0)
Monocytes Relative: 5 %
Neutro Abs: 12.9 10*3/uL — ABNORMAL HIGH (ref 1.7–7.7)
Neutrophils Relative %: 89 %
Platelets: 251 10*3/uL (ref 150–400)
RBC: 4.47 MIL/uL (ref 3.87–5.11)
RDW: 13.7 % (ref 11.5–15.5)
WBC: 14.6 10*3/uL — ABNORMAL HIGH (ref 4.0–10.5)
nRBC: 0 % (ref 0.0–0.2)

## 2019-09-19 LAB — COMPREHENSIVE METABOLIC PANEL
ALT: 33 U/L (ref 0–44)
AST: 36 U/L (ref 15–41)
Albumin: 4 g/dL (ref 3.5–5.0)
Alkaline Phosphatase: 88 U/L (ref 38–126)
Anion gap: 13 (ref 5–15)
BUN: 27 mg/dL — ABNORMAL HIGH (ref 8–23)
CO2: 21 mmol/L — ABNORMAL LOW (ref 22–32)
Calcium: 9.6 mg/dL (ref 8.9–10.3)
Chloride: 105 mmol/L (ref 98–111)
Creatinine, Ser: 1.09 mg/dL — ABNORMAL HIGH (ref 0.44–1.00)
GFR calc Af Amer: 54 mL/min — ABNORMAL LOW (ref >60)
GFR calc non Af Amer: 46 mL/min — ABNORMAL LOW (ref >60)
Glucose, Bld: 96 mg/dL (ref 70–99)
Potassium: 4.2 mmol/L (ref 3.5–5.1)
Sodium: 139 mmol/L (ref 135–145)
Total Bilirubin: 0.6 mg/dL (ref 0.3–1.2)
Total Protein: 7.1 g/dL (ref 6.5–8.1)

## 2019-09-19 LAB — SARS CORONAVIRUS 2 (TAT 6-24 HRS): SARS Coronavirus 2: NEGATIVE

## 2019-09-19 MED ORDER — METHOCARBAMOL 500 MG PO TABS
500.0000 mg | ORAL_TABLET | Freq: Four times a day (QID) | ORAL | Status: DC | PRN
  Administered 2019-09-20: 500 mg via ORAL
  Filled 2019-09-19 (×2): qty 1

## 2019-09-19 MED ORDER — LORAZEPAM 0.5 MG PO TABS
0.5000 mg | ORAL_TABLET | Freq: Every day | ORAL | Status: DC
  Administered 2019-09-19 – 2019-09-20 (×2): 0.5 mg via ORAL
  Filled 2019-09-19 (×2): qty 1

## 2019-09-19 MED ORDER — FENTANYL CITRATE (PF) 100 MCG/2ML IJ SOLN
50.0000 ug | INTRAMUSCULAR | Status: AC | PRN
  Administered 2019-09-19 (×3): 50 ug via INTRAVENOUS
  Filled 2019-09-19 (×3): qty 2

## 2019-09-19 MED ORDER — LIDOCAINE 5 % EX PTCH
1.0000 | MEDICATED_PATCH | CUTANEOUS | Status: DC
  Administered 2019-09-19 – 2019-09-20 (×2): 1 via TRANSDERMAL
  Filled 2019-09-19 (×2): qty 1

## 2019-09-19 MED ORDER — ENOXAPARIN SODIUM 30 MG/0.3ML ~~LOC~~ SOLN
30.0000 mg | SUBCUTANEOUS | Status: DC
  Administered 2019-09-19 – 2019-09-20 (×2): 30 mg via SUBCUTANEOUS
  Filled 2019-09-19 (×2): qty 0.3

## 2019-09-19 MED ORDER — HYDROCODONE-ACETAMINOPHEN 5-325 MG PO TABS
1.0000 | ORAL_TABLET | Freq: Four times a day (QID) | ORAL | Status: DC | PRN
  Administered 2019-09-19: 2 via ORAL
  Administered 2019-09-20 (×2): 1 via ORAL
  Administered 2019-09-20 – 2019-09-21 (×3): 2 via ORAL
  Filled 2019-09-19: qty 1
  Filled 2019-09-19 (×4): qty 2

## 2019-09-19 MED ORDER — FENTANYL CITRATE (PF) 100 MCG/2ML IJ SOLN
50.0000 ug | Freq: Once | INTRAMUSCULAR | Status: AC
  Administered 2019-09-19: 50 ug via INTRAVENOUS
  Filled 2019-09-19: qty 2

## 2019-09-19 MED ORDER — ASPIRIN EC 81 MG PO TBEC
81.0000 mg | DELAYED_RELEASE_TABLET | Freq: Every day | ORAL | Status: DC
  Administered 2019-09-20 – 2019-09-21 (×2): 81 mg via ORAL
  Filled 2019-09-19 (×2): qty 1

## 2019-09-19 MED ORDER — METHOCARBAMOL 1000 MG/10ML IJ SOLN
500.0000 mg | Freq: Four times a day (QID) | INTRAVENOUS | Status: DC | PRN
  Filled 2019-09-19: qty 5

## 2019-09-19 MED ORDER — ROSUVASTATIN CALCIUM 5 MG PO TABS
10.0000 mg | ORAL_TABLET | Freq: Every day | ORAL | Status: DC
  Administered 2019-09-19 – 2019-09-20 (×2): 10 mg via ORAL
  Filled 2019-09-19 (×2): qty 2

## 2019-09-19 MED ORDER — AMLODIPINE BESYLATE 2.5 MG PO TABS
2.5000 mg | ORAL_TABLET | Freq: Every day | ORAL | Status: DC
  Administered 2019-09-19 – 2019-09-20 (×2): 2.5 mg via ORAL
  Filled 2019-09-19 (×2): qty 1

## 2019-09-19 MED ORDER — CLOPIDOGREL BISULFATE 75 MG PO TABS
75.0000 mg | ORAL_TABLET | Freq: Every day | ORAL | Status: DC
  Administered 2019-09-19 – 2019-09-21 (×3): 75 mg via ORAL
  Filled 2019-09-19 (×3): qty 1

## 2019-09-19 MED ORDER — DOCUSATE SODIUM 100 MG PO CAPS
100.0000 mg | ORAL_CAPSULE | Freq: Two times a day (BID) | ORAL | Status: DC
  Administered 2019-09-19 – 2019-09-21 (×4): 100 mg via ORAL
  Filled 2019-09-19 (×4): qty 1

## 2019-09-19 MED ORDER — BISACODYL 5 MG PO TBEC: 5.0000 mg | DELAYED_RELEASE_TABLET | Freq: Every day | ORAL | Status: DC | PRN

## 2019-09-19 MED ORDER — MORPHINE SULFATE (PF) 2 MG/ML IV SOLN: 0.5000 mg | INTRAVENOUS | Status: DC | PRN

## 2019-09-19 MED ORDER — SERTRALINE HCL 50 MG PO TABS
150.0000 mg | ORAL_TABLET | Freq: Every day | ORAL | Status: DC
  Administered 2019-09-19 – 2019-09-21 (×3): 150 mg via ORAL
  Filled 2019-09-19 (×3): qty 1

## 2019-09-19 MED ORDER — SODIUM CHLORIDE 0.9 % IV BOLUS
500.0000 mL | Freq: Once | INTRAVENOUS | Status: AC
  Administered 2019-09-19: 500 mL via INTRAVENOUS

## 2019-09-19 MED ORDER — POLYETHYLENE GLYCOL 3350 17 GM/SCOOP PO POWD
17.0000 g | Freq: Every day | ORAL | Status: DC | PRN
  Filled 2019-09-19: qty 255

## 2019-09-19 NOTE — Progress Notes (Signed)
Orthopedic Tech Progress Note Patient Details:  Michaela Simpson [birth year] [id number] Level 2 trauma Patient ID: Michaela Simpson, female   DOB: 05-29-1932, 86 y.o.   MRN: [id number]   Michaela Simpson 09/19/2019, 9:51 AM
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
## 2019-09-19 NOTE — ED Notes (Signed)
Pt back from radiology.  Pain improved, though still requests pain meds.  MD notified.

## 2019-09-19 NOTE — ED Provider Notes (Signed)
[facility] EMERGENCY DEPARTMENT Provider Note   CSN: [id number] Arrival date & time: 09/19/19  [id number]     History Chief Complaint  Patient presents with  . Fall    Michaela Simpson is a 86 y.o. female.  HPI 86 year old female presents with fall.  She thought something on her stove top was burning and then fell.  She has been having some left hip pain chronically and knows she needs a replacement.  Also some left arm pain from humerus fracture.  However today she injured her left leg and has a hard time walking.  However the daughter indicates the patient was able to get up from her fall.  Unclear if she hit her head.  She also fell 3 days ago which is where the bruising to her head and face and neck came from.  Diagnosed with clavicle fracture.  She is on clopidogrel.  Patient also having pain under her left breast/mid axillary chest. No arm injury today.   Past Medical History:  Diagnosis Date  . AKI (acute kidney injury) ([place])   . Anemia   . Aortic stenosis   . Depression   . Hip fracture ([place])   . HTN (hypertension)   . Humerus fracture   . Leukocytosis   . Osteoporosis   . Peripheral vascular disease ([place])    blood clot right leg  . Rheumatoid arthritis ([place])   . Ulcer of lower extremity [facility])     Patient Active Problem List   Diagnosis Date Noted  . S/P TAVR (transcatheter aortic valve replacement) 08/08/2019  . Severe aortic stenosis 07/19/2019  . Essential hypertension, benign 03/15/2019  . Sequelae of open wound of right lower extremity 03/15/2019  . Major depression, recurrent, chronic ([place]) 03/15/2019  . Chronic constipation 03/15/2019  . Closed left humeral fracture 03/08/2019  . Wound of right leg, initial encounter 03/08/2019  . Leukocytosis 03/08/2019  . Fracture of femoral neck, left, closed ([place]) 03/08/2019  . AKI (acute kidney injury) ([place]) 03/08/2019  . Depression   . HTN (hypertension)   . Bilateral carpal tunnel syndrome  05/15/2018  . Rash and nonspecific skin eruption 04/06/2015  . Acute blood loss anemia 03/22/2015  . Edema 03/21/2015  . Acute renal failure syndrome ([place])   . Closed right hip fracture ([place]) 03/14/2015  . Rheumatoid arthritis ([place]) 03/14/2015  . Fall 03/14/2015    Past Surgical History:  Procedure Laterality Date  . ABDOMINAL AORTOGRAM W/LOWER EXTREMITY N/A 05/08/2019   Procedure: ABDOMINAL AORTOGRAM W/LOWER EXTREMITY;  Surgeon: Serafina Mitchell, MD;  Location: [employer] CV LAB;  Service: Cardiovascular;  Laterality: N/A;  . HIP PINNING,CANNULATED Left 03/09/2019   Procedure: CANNULATED HIP PINNING;  Surgeon: Mcarthur Rossetti, MD;  Location: [facility];  Service: Orthopedics;  Laterality: Left;  . INTRAMEDULLARY (IM) NAIL INTERTROCHANTERIC Right 03/14/2015   Procedure: RIGHT INTERTROCHANTRIC INTRAMEDULLARY (IM) NAIL ;  Surgeon: Mcarthur Rossetti, MD;  Location: [place];  Service: Orthopedics;  Laterality: Right;  . PERIPHERAL VASCULAR INTERVENTION Right 05/08/2019   Procedure: PERIPHERAL VASCULAR INTERVENTION;  Surgeon: Serafina Mitchell, MD;  Location: [place] CV LAB;  Service: Cardiovascular;  Laterality: Right;  . RIGHT/LEFT HEART CATH AND CORONARY ANGIOGRAPHY N/A 07/19/2019   Procedure: RIGHT/LEFT HEART CATH AND CORONARY ANGIOGRAPHY;  Surgeon: Sherren Mocha, MD;  Location: [place] CV LAB;  Service: Cardiovascular;  Laterality: N/A;  . TEE WITHOUT CARDIOVERSION N/A 08/07/2019   Procedure: TRANSESOPHAGEAL ECHOCARDIOGRAM (TEE);  Surgeon: Sherren Mocha, MD;  Location: [facility]  INVASIVE CV LAB;  Service: Open Heart Surgery;  Laterality: N/A;  . TRANSCATHETER AORTIC VALVE REPLACEMENT, TRANSFEMORAL  08/07/2019  . TRANSCATHETER AORTIC VALVE REPLACEMENT, TRANSFEMORAL N/A 08/07/2019   Procedure: TRANSCATHETER AORTIC VALVE REPLACEMENT, TRANSFEMORAL;  Surgeon: Sherren Mocha, MD;  Location: [place] CV LAB;  Service: Open Heart Surgery;  Laterality: N/A;     OB History   No  obstetric history on file.     Family History  Problem Relation Age of Onset  . Cancer Sister     Social History   Tobacco Use  . Smoking status: Never Smoker  . Smokeless tobacco: Never Used  Substance Use Topics  . Alcohol use: No  . Drug use: No    Home Medications Prior to Admission medications   Medication Sig Start Date End Date Taking? Authorizing Provider  acetaminophen (TYLENOL) 325 MG tablet Take 2 tablets (650 mg total) by mouth every 6 (six) hours as needed for mild pain (or Fever >/= 101). 03/17/15  Yes Rizwan, Saima, MD  amLODipine (NORVASC) 2.5 MG tablet Take 1 tablet (2.5 mg total) by mouth at bedtime. 04/05/19  Yes Gerlene Fee, NP  aspirin EC 81 MG tablet Take 81 mg by mouth daily.   Yes [provider]  Boswellia-Glucosamine-Vit D (OSTEO BI-FLEX ONE PER DAY PO) Take 1 tablet by mouth daily.   Yes [provider]  Cholecalciferol (VITAMIN D-3) 125 MCG (5000 UT) TABS Take 1 tablet by mouth daily. 09/17/19  Yes Hilts, Legrand Como, MD  clindamycin (CLEOCIN) 300 MG capsule Take 2 capsules (600mg ) 1 hour prior to all dental visits. Patient taking differently: Take 600 mg by mouth See admin instructions. Take 2 capsules (600mg ) 1 hour prior to all dental visits. 08/22/19  Yes Eileen Stanford, PA-C  clopidogrel (PLAVIX) 75 MG tablet Take 1 tablet (75 mg total) by mouth daily. 05/08/19  Yes Serafina Mitchell, MD  Lactobacillus (PROBIOTIC ACIDOPHILUS PO) Take 1 tablet by mouth daily.   Yes [provider]  lactose free nutrition (BOOST) LIQD Take 237 mLs by mouth 3 (three) times daily.    Yes [provider]  LORazepam (ATIVAN) 0.5 MG tablet Take 0.5 mg by mouth at bedtime.  04/10/19  Yes [provider]  Magnesium 400 MG CAPS Take 400 mg by mouth daily. 09/17/19  Yes Hilts, Legrand Como, MD  Menatetrenone (VITAMIN K2) 100 MCG TABS Take 100 mcg by mouth daily. 09/17/19  Yes Hilts, Legrand Como, MD  Multiple Vitamins-Minerals (MULTIVITAMIN  WITH MINERALS) tablet Take 1 tablet by mouth daily.   Yes [provider]  Polyethylene Glycol 3350 (MIRALAX PO) Take 17 g by mouth daily.    Yes [provider]  rosuvastatin (CRESTOR) 10 MG tablet Take 1 tablet (10 mg total) by mouth at bedtime. 05/08/19 05/07/20 Yes Serafina Mitchell, MD  sertraline (ZOLOFT) 100 MG tablet Take 150 mg by mouth daily.    Yes [provider]  traMADol (ULTRAM) 50 MG tablet Take 1 tablet (50 mg total) by mouth every 6 (six) hours as needed. Patient taking differently: Take 50 mg by mouth every 6 (six) hours as needed for moderate pain.  09/11/19  Yes Mcarthur Rossetti, MD  vitamin B-12 (CYANOCOBALAMIN) 1000 MCG tablet Take 1,000 mcg by mouth daily.   Yes [provider]    Allergies    Penicillins and Codeine  Review of Systems   Review of Systems  Cardiovascular: Positive for chest pain.  Gastrointestinal: Negative for abdominal pain.  Musculoskeletal: Positive  for arthralgias. Negative for back pain.  Neurological: Negative for headaches.  All other systems reviewed and are negative.   Physical Exam Updated Vital Signs BP 139/69   Pulse 89   Temp 99.2 F (37.3 C) (Oral)   Resp 16   Ht 5' (1.524 m)   Wt 46.3 kg   SpO2 99%   BMI 19.94 kg/m   Physical Exam Vitals and nursing note reviewed.  Constitutional:      General: She is not in acute distress.    Appearance: She is well-developed. She is not ill-appearing or diaphoretic.  HENT:     Head: Normocephalic.     Comments: Bruising to left face, neck    Right Ear: External ear normal.     Left Ear: External ear normal.     Nose: Nose normal.  Eyes:     General:        Right eye: No discharge.        Left eye: No discharge.     Extraocular Movements: Extraocular movements intact.     Pupils: Pupils are equal, round, and reactive to light.  Cardiovascular:     Rate and Rhythm: Normal rate and regular rhythm.     Pulses:          Dorsalis pedis  pulses are 2+ on the right side and 2+ on the left side.     Heart sounds: Normal heart sounds.  Pulmonary:     Effort: Pulmonary effort is normal.     Breath sounds: Normal breath sounds.  Chest:     Chest wall: No tenderness.  Abdominal:     Palpations: Abdomen is soft.     Tenderness: There is no abdominal tenderness.  Musculoskeletal:     Cervical back: No tenderness.     Thoracic back: No tenderness.     Lumbar back: No tenderness.     Left hip: Tenderness present. No deformity. Decreased range of motion.     Left upper leg: No tenderness.     Left knee: No tenderness.     Comments: Normal ROM of left shoulder. No tenderness in left arm  Skin:    General: Skin is warm and dry.  Neurological:     Mental Status: She is alert.  Psychiatric:        Mood and Affect: Mood is not anxious.     ED Results / Procedures / Treatments   Labs (all labs ordered are listed, but only abnormal results are displayed) Labs Reviewed  COMPREHENSIVE METABOLIC PANEL - Abnormal; Notable for the following components:      Result Value   CO2 21 (*)    BUN 27 (*)    Creatinine, Ser 1.09 (*)    GFR calc non Af Amer 46 (*)    GFR calc Af Amer 54 (*)    All other components within normal limits  CBC WITH DIFFERENTIAL/PLATELET - Abnormal; Notable for the following components:   WBC 14.6 (*)    Neutro Abs 12.9 (*)    Abs Immature Granulocytes 0.19 (*)    All other components within normal limits  SARS CORONAVIRUS 2 (TAT 6-24 HRS)    EKG EKG Interpretation  Date/Time:  Wednesday September 19 2019 12:21:49 EDT Ventricular Rate:  93 PR Interval:    QRS Duration: 105 QT Interval:  382 QTC Calculation: 476 R Axis:   -18 Text Interpretation: Sinus rhythm Borderline left axis deviation RSR' in V1 or V2, probably normal variant Borderline T  abnormalities, anterior leads no significant change since Feb 2021 Confirmed by Sherwood Gambler [phone]) on 09/19/2019 12:43:53 PM   Radiology DG Ribs  Unilateral W/Chest Left  Result Date: 09/19/2019 CLINICAL DATA:  Fall. Left lower rib pain and bruising EXAM: LEFT RIBS AND CHEST - 3+ VIEW COMPARISON:  09/19/2019 chest radiograph. FINDINGS: Aortic valve prosthesis in place. Stable cardiomediastinal silhouette with top-normal heart size. No pneumothorax. No right pleural effusion. Trace left pleural effusion. No pulmonary edema. Mild left basilar atelectasis. Acute lateral left seventh rib fracture, nondisplaced. Slight deformity of the lateral left eighth and ninth ribs. No focal osseous lesions. IMPRESSION: 1. Acute lateral left seventh rib fracture. Slight deformity of the lateral left eighth and ninth ribs, which could represent additional acute nondisplaced fractures. 2. Trace left pleural effusion with mild left basilar atelectasis. No pneumothorax. Electronically Signed   By: Ilona Sorrel M.D.   On: 09/19/2019 11:06   CT Head Wo Contrast  Result Date: 09/19/2019 CLINICAL DATA:  86 year old female with history of head trauma from recurrent falls. On blood thinners. EXAM: CT HEAD WITHOUT CONTRAST TECHNIQUE: Contiguous axial images were obtained from the base of the skull through the vertex without intravenous contrast. COMPARISON:  Head CT 03/08/2019. FINDINGS: Brain: Patchy areas of decreased attenuation are noted throughout the deep and periventricular white matter of the cerebral hemispheres bilaterally, compatible with mild chronic microvascular ischemic disease. No evidence of acute infarction, hemorrhage, hydrocephalus, extra-axial collection or mass lesion/mass effect. Vascular: No hyperdense vessel or unexpected calcification. Skull: Normal. Negative for fracture or focal lesion. Sinuses/Orbits: No acute finding. Other: None. IMPRESSION: 1. No acute displaced skull fracture or signs to suggest significant acute intracranial trauma. 2. Mild chronic microvascular ischemic changes in the cerebral white matter. Electronically Signed   By: Vinnie Langton M.D.   On: 09/19/2019 11:05   CT Chest Wo Contrast  Result Date: 09/19/2019 CLINICAL DATA:  Fall, pain, rib fractures EXAM: CT CHEST WITHOUT CONTRAST TECHNIQUE: Multidetector CT imaging of the chest was performed following the standard protocol without IV contrast. COMPARISON:  Same day radiographs, CT chest angiogram, 07/26/2019 FINDINGS: Cardiovascular: Interval placement of aortic valve stent endograft. Aortic atherosclerosis. Normal heart size. Left coronary artery calcifications. No pericardial effusion. Mediastinum/Nodes: No enlarged mediastinal, hilar, or axillary lymph nodes. Thyroid gland, trachea, and esophagus demonstrate no significant findings. Lungs/Pleura: Unchanged somewhat nodular, bandlike scarring or atelectasis of the left lower lobe (series 8, image 103). Trace left pleural effusion associated atelectasis or consolidation. Upper Abdomen: No acute abnormality. Musculoskeletal: No chest wall mass or suspicious bone lesions identified. No acute appearing fractures of the left ribs. There are multiple chronic fracture deformities, unchanged in appearance and configuration compared to CT dated 07/26/2019. Disc degenerative disease of the thoracic spine. Unchanged endplate deformities of T12, L1, and L2. Chronic fracture deformity of the proximal left humerus. IMPRESSION: 1. No acute appearing rib fractures. Multiple chronic rib fracture deformities, unchanged compared to CT dated 07/26/2019. 2.  Unchanged vertebral endplate deformities of T12, L1, and L2. 3.  Chronic fracture deformity of the proximal left humerus. 4. Interval placement of aortic valve stent endograft. Aortic Atherosclerosis (ICD10-I70.0). 5.  Coronary artery disease. 6.  Trace left pleural effusion. Electronically Signed   By: Eddie Candle M.D.   On: 09/19/2019 14:43   CT PELVIS WO CONTRAST  Result Date: 09/19/2019 CLINICAL DATA:  Fall, left-sided leg pain EXAM: CT PELVIS WITHOUT CONTRAST TECHNIQUE: Multidetector CT  imaging of the pelvis was performed following the standard protocol without intravenous contrast.  COMPARISON:  Same day pelvic radiographs, CT chest abdomen pelvis angiogram, 07/26/2019 FINDINGS: Urinary Tract:  No abnormality visualized. Bowel:  Unremarkable visualized pelvic bowel loops. Vascular/Lymphatic: No pathologically enlarged lymph nodes. Partially imaged right superficial femoral artery stent. Reproductive:  No mass or other significant abnormality Other:  None. Musculoskeletal: Minimally displaced acute fractures of the left sacral ala (series 4, image 30). Minimally displaced acute fractures of the anterior left acetabulum (series 4, image 67). Minimally displaced acute fractures of the left inferior pubic ramus. (Series 4, image 93). Status post screw fixation of the left femoral neck and intramedullary screw and nail fixation of the right femoral neck with unchanged appearance and alignment. Osteopenia. IMPRESSION: 1. Minimally displaced acute fractures of the left sacral ala, anterior left acetabulum, and left inferior pubic ramus. 2. Status post screw fixation of the left femoral neck and intramedullary screw and nail fixation of the right femoral neck with unchanged appearance and alignment. 3.  Osteopenia. Electronically Signed   By: Eddie Candle M.D.   On: 09/19/2019 14:31   DG Pelvis Portable  Result Date: 09/19/2019 CLINICAL DATA:  Pain following fall EXAM: PORTABLE PELVIS 1-2 VIEWS COMPARISON:  March 19, 2019 FINDINGS: Postoperative changes noted in each proximal femur, stable. There is evidence of an old healed fracture of the subcapital femoral neck region on the left. There is an old intertrochanteric fracture on the right. No acute fracture or dislocation is demonstrable. There is moderate narrowing of each hip joint. Bones are osteoporotic. There is a stent on the right extending into the medial right thigh. IMPRESSION: Postoperative changes with evidence of old fractures  bilaterally, unchanged in alignment compared to prior study. No acute appearing fracture or dislocation. Bones osteoporotic. Narrowing each hip joint is stable. Electronically Signed   By: Lowella Grip III M.D.   On: 09/19/2019 10:12   DG Chest Portable 1 View  Result Date: 09/19/2019 CLINICAL DATA:  Pain following fall EXAM: PORTABLE CHEST 1 VIEW COMPARISON:  August 07, 2019 FINDINGS: There is a small left pleural effusion. Lungs otherwise are clear. Heart size and pulmonary vascularity are normal. No adenopathy. Aortic valve replacement noted. Apparent old fracture proximal left humerus with remodeling. Healed fracture of the lateral left ninth rib. Acute appearing fracture of the lateral left clavicle noted with slight impaction in this area. IMPRESSION: Acute appearing fracture lateral left clavicle. Old healed fractures at other sites noted. Small left pleural effusion. Lungs elsewhere clear. No pneumothorax. Stable cardiac silhouette. Status post aortic valve replacement. Electronically Signed   By: Lowella Grip III M.D.   On: 09/19/2019 10:14   DG Femur Min 2 Views Left  Result Date: 09/19/2019 CLINICAL DATA:  Fall, left-sided pain EXAM: LEFT FEMUR 2 VIEWS COMPARISON:  09/17/2019 left femur radiograph FINDINGS: Three fixation pins in the left femoral neck to peer intact with no evidence of loosening. Healed deformity in the left femoral head and neck with articular collapse. No acute osseous fracture. No left hip dislocation. Moderate osteoarthritis in the weight-bearing portion of the left hip. No focal osseous lesions. IMPRESSION: No acute osseous fracture. Healed deformity in the left femoral head and neck with fixation pins in the left femoral neck, without hardware complication. Osteoarthritis in the weight-bearing portion of the left hip. Electronically Signed   By: Ilona Sorrel M.D.   On: 09/19/2019 11:08    Procedures Procedures (including critical care time)  Medications  Ordered in ED Medications  fentaNYL (SUBLIMAZE) injection 50 mcg (50 mcg Intravenous Given 09/19/19  1443)  fentaNYL (SUBLIMAZE) injection 50 mcg (50 mcg Intravenous Given 09/19/19 1020)  fentaNYL (SUBLIMAZE) injection 50 mcg (50 mcg Intravenous Given 09/19/19 1126)  sodium chloride 0.9 % bolus 500 mL (0 mLs Intravenous Stopped 09/19/19 1442)    ED Course  I have reviewed the triage vital signs and the nursing notes.  Pertinent labs & imaging results that were available during my care of the patient were reviewed by me and considered in my medical decision making (see chart for details).    MDM Rules/Calculators/A&P                      Patient's initial x-rays show concern for acute rib fractures but no obvious pelvic/hip trauma.  Discussed with trauma for possible admission and they asked for CT chest in addition to the pelvis already ordered for inability to bear weight after pain control.  CT chest shows these were old rib fractures in the pelvis shows sacral alae fracture, acetabulum fracture, and pubic ramus fracture.  Orthopedics consulted.  Trauma is deferring to medicine for admission as there does not appear to be acute trauma besides the pelvis and she has multiple medical comorbidities. Dr. Lorin Mercy to admit. Final Clinical Impression(s) / ED Diagnoses Final diagnoses:  Closed displaced fracture of anterior wall of left acetabulum, initial encounter ([place])  Closed fracture of sacrum, unspecified portion of sacrum, initial encounter ([place])  Closed fracture of ramus of left pubis, initial encounter [facility])    Rx / [place] Orders ED Discharge Orders    None       Sherwood Gambler, MD 09/19/19 1529

## 2019-09-19 NOTE — Consult Note (Signed)
Reason for Consult:Pelvic fxs Referring Physician: [name] is an 86 y.o. female.  HPI: Michaela Simpson was at the stove this morning as was startled by something and fell onto her right side. She hit her life alert and was able to get herself back into a chair. She was unable to ambulate however and was brought to the ED for evaluation. X-rays showed pelvic fxs and orthopedic surgery was consulted. She c/o left groin and leg pain mainly. She has had a hx/o frequent falls recently.  Past Medical History:  Diagnosis Date  . AKI (acute kidney injury) ([place])   . Anemia   . Aortic stenosis   . Depression   . Hip fracture ([place])   . HTN (hypertension)   . Humerus fracture   . Leukocytosis   . Osteoporosis   . Peripheral vascular disease ([place])    blood clot right leg  . Rheumatoid arthritis ([place])   . Ulcer of lower extremity [facility])     Past Surgical History:  Procedure Laterality Date  . ABDOMINAL AORTOGRAM W/LOWER EXTREMITY N/A 05/08/2019   Procedure: ABDOMINAL AORTOGRAM W/LOWER EXTREMITY;  Surgeon: Serafina Mitchell, MD;  Location: [facility];  Service: Cardiovascular;  Laterality: N/A;  . HIP PINNING,CANNULATED Left 03/09/2019   Procedure: CANNULATED HIP PINNING;  Surgeon: Mcarthur Rossetti, MD;  Location: [facility];  Service: Orthopedics;  Laterality: Left;  . INTRAMEDULLARY (IM) NAIL INTERTROCHANTERIC Right 03/14/2015   Procedure: RIGHT INTERTROCHANTRIC INTRAMEDULLARY (IM) NAIL ;  Surgeon: Mcarthur Rossetti, MD;  Location: [facility];  Service: Orthopedics;  Laterality: Right;  . PERIPHERAL VASCULAR INTERVENTION Right 05/08/2019   Procedure: PERIPHERAL VASCULAR INTERVENTION;  Surgeon: Serafina Mitchell, MD;  Location: [facility];  Service: Cardiovascular;  Laterality: Right;  . RIGHT/LEFT HEART CATH AND CORONARY ANGIOGRAPHY N/A 07/19/2019   Procedure: RIGHT/LEFT HEART CATH AND CORONARY ANGIOGRAPHY;  Surgeon: Sherren Mocha, MD;  Location: [facility];   Service: Cardiovascular;  Laterality: N/A;  . TEE WITHOUT CARDIOVERSION N/A 08/07/2019   Procedure: TRANSESOPHAGEAL ECHOCARDIOGRAM (TEE);  Surgeon: Sherren Mocha, MD;  Location: [facility];  Service: Open Heart Surgery;  Laterality: N/A;  . TRANSCATHETER AORTIC VALVE REPLACEMENT, TRANSFEMORAL  08/07/2019  . TRANSCATHETER AORTIC VALVE REPLACEMENT, TRANSFEMORAL N/A 08/07/2019   Procedure: TRANSCATHETER AORTIC VALVE REPLACEMENT, TRANSFEMORAL;  Surgeon: Sherren Mocha, MD;  Location: [facility];  Service: Open Heart Surgery;  Laterality: N/A;    Family History  Problem Relation Age of Onset  . Cancer Sister     Social History:  reports that she has never smoked. She has never used smokeless tobacco. She reports that she does not drink alcohol or use drugs.  Allergies:  Allergies  Allergen Reactions  . Penicillins Nausea And Vomiting and Other (See Comments)    Child hood allergy - Flu symptoms; constant vomiting  Did it involve swelling of the face/tongue/throat, SOB, or low BP? Unknown Did it involve sudden or severe rash/hives, skin peeling, or any reaction on the inside of your mouth or nose? Unknown Did you need to seek medical attention at a hospital or doctor's office? Unknown When did it last happen?50+ years If all above answers are "NO", may proceed with cephalosporin use.   . Codeine Nausea And Vomiting and Rash    INTOLERANCE >  VOMITING    Medications: I have reviewed the patient's current medications.  Results for orders placed or performed during the hospital encounter of 09/19/19 (from the past  48 hour(s))  Comprehensive metabolic panel     Status: Abnormal   Collection Time: 09/19/19 10:23 AM  Result Value Ref Range   Sodium 139 135 - 145 mmol/L   Potassium 4.2 3.5 - 5.1 mmol/L   Chloride 105 98 - 111 mmol/L   CO2 21 (L) 22 - 32 mmol/L   Glucose, Bld 96 70 - 99 mg/dL    Comment: Glucose reference range applies only to samples taken after  fasting for at least 8 hours.   BUN 27 (H) 8 - 23 mg/dL   Creatinine, Ser 1.09 (H) 0.44 - 1.00 mg/dL   Calcium 9.6 8.9 - 10.3 mg/dL   Total Protein 7.1 6.5 - 8.1 g/dL   Albumin 4.0 3.5 - 5.0 g/dL   AST 36 15 - 41 U/L   ALT 33 0 - 44 U/L   Alkaline Phosphatase 88 38 - 126 U/L   Total Bilirubin 0.6 0.3 - 1.2 mg/dL   GFR calc non Af Amer 46 (L) >60 mL/min   GFR calc Af Amer 54 (L) >60 mL/min   Anion gap 13 5 - 15    Comment: Performed at [facility] [address], [place], [place] [address]  CBC with Differential     Status: Abnormal   Collection Time: 09/19/19 10:23 AM  Result Value Ref Range   WBC 14.6 (H) 4.0 - 10.5 K/uL   RBC 4.47 3.87 - 5.11 MIL/uL   Hemoglobin 12.7 12.0 - 15.0 g/dL   HCT 41.8 36.0 - 46.0 %   MCV 93.5 80.0 - 100.0 fL   MCH 28.4 26.0 - 34.0 pg   MCHC 30.4 30.0 - 36.0 g/dL   RDW 13.7 11.5 - 15.5 %   Platelets 251 150 - 400 K/uL   nRBC 0.0 0.0 - 0.2 %   Neutrophils Relative % 89 %   Neutro Abs 12.9 (H) 1.7 - 7.7 K/uL   Lymphocytes Relative 5 %   Lymphs Abs 0.7 0.7 - 4.0 K/uL   Monocytes Relative 5 %   Monocytes Absolute 0.7 0.1 - 1.0 K/uL   Eosinophils Relative 0 %   Eosinophils Absolute 0.0 0.0 - 0.5 K/uL   Basophils Relative 0 %   Basophils Absolute 0.0 0.0 - 0.1 K/uL   Immature Granulocytes 1 %   Abs Immature Granulocytes 0.19 (H) 0.00 - 0.07 K/uL    Comment: Performed at [facility] [address] [place], [place] [address]    DG Ribs Unilateral W/Chest Left  Result Date: 09/19/2019 CLINICAL DATA:  Fall. Left lower rib pain and bruising EXAM: LEFT RIBS AND CHEST - 3+ VIEW COMPARISON:  09/19/2019 chest radiograph. FINDINGS: Aortic valve prosthesis in place. Stable cardiomediastinal silhouette with top-normal heart size. No pneumothorax. No right pleural effusion. Trace left pleural effusion. No pulmonary edema. Mild left basilar atelectasis. Acute lateral left seventh rib fracture, nondisplaced. Slight deformity of the lateral left  eighth and ninth ribs. No focal osseous lesions. IMPRESSION: 1. Acute lateral left seventh rib fracture. Slight deformity of the lateral left eighth and ninth ribs, which could represent additional acute nondisplaced fractures. 2. Trace left pleural effusion with mild left basilar atelectasis. No pneumothorax. Electronically Signed   By: Ilona Sorrel M.D.   On: 09/19/2019 11:06   CT Head Wo Contrast  Result Date: 09/19/2019 CLINICAL DATA:  86 year old female with history of head trauma from recurrent falls. On blood thinners. EXAM: CT HEAD WITHOUT CONTRAST TECHNIQUE: Contiguous axial images were obtained from  the base of the skull through the vertex without intravenous contrast. COMPARISON:  Head CT 03/08/2019. FINDINGS: Brain: Patchy areas of decreased attenuation are noted throughout the deep and periventricular white matter of the cerebral hemispheres bilaterally, compatible with mild chronic microvascular ischemic disease. No evidence of acute infarction, hemorrhage, hydrocephalus, extra-axial collection or mass lesion/mass effect. Vascular: No hyperdense vessel or unexpected calcification. Skull: Normal. Negative for fracture or focal lesion. Sinuses/Orbits: No acute finding. Other: None. IMPRESSION: 1. No acute displaced skull fracture or signs to suggest significant acute intracranial trauma. 2. Mild chronic microvascular ischemic changes in the cerebral white matter. Electronically Signed   By: Vinnie Langton M.D.   On: 09/19/2019 11:05   CT Chest Wo Contrast  Result Date: 09/19/2019 CLINICAL DATA:  Fall, pain, rib fractures EXAM: CT CHEST WITHOUT CONTRAST TECHNIQUE: Multidetector CT imaging of the chest was performed following the standard protocol without IV contrast. COMPARISON:  Same day radiographs, CT chest angiogram, 07/26/2019 FINDINGS: Cardiovascular: Interval placement of aortic valve stent endograft. Aortic atherosclerosis. Normal heart size. Left coronary artery calcifications. No  pericardial effusion. Mediastinum/Nodes: No enlarged mediastinal, hilar, or axillary lymph nodes. Thyroid gland, trachea, and esophagus demonstrate no significant findings. Lungs/Pleura: Unchanged somewhat nodular, bandlike scarring or atelectasis of the left lower lobe (series 8, image 103). Trace left pleural effusion associated atelectasis or consolidation. Upper Abdomen: No acute abnormality. Musculoskeletal: No chest wall mass or suspicious bone lesions identified. No acute appearing fractures of the left ribs. There are multiple chronic fracture deformities, unchanged in appearance and configuration compared to CT dated 07/26/2019. Disc degenerative disease of the thoracic spine. Unchanged endplate deformities of T12, L1, and L2. Chronic fracture deformity of the proximal left humerus. IMPRESSION: 1. No acute appearing rib fractures. Multiple chronic rib fracture deformities, unchanged compared to CT dated 07/26/2019. 2.  Unchanged vertebral endplate deformities of T12, L1, and L2. 3.  Chronic fracture deformity of the proximal left humerus. 4. Interval placement of aortic valve stent endograft. Aortic Atherosclerosis (ICD10-I70.0). 5.  Coronary artery disease. 6.  Trace left pleural effusion. Electronically Signed   By: Eddie Candle M.D.   On: 09/19/2019 14:43   CT PELVIS WO CONTRAST  Result Date: 09/19/2019 CLINICAL DATA:  Fall, left-sided leg pain EXAM: CT PELVIS WITHOUT CONTRAST TECHNIQUE: Multidetector CT imaging of the pelvis was performed following the standard protocol without intravenous contrast. COMPARISON:  Same day pelvic radiographs, CT chest abdomen pelvis angiogram, 07/26/2019 FINDINGS: Urinary Tract:  No abnormality visualized. Bowel:  Unremarkable visualized pelvic bowel loops. Vascular/Lymphatic: No pathologically enlarged lymph nodes. Partially imaged right superficial femoral artery stent. Reproductive:  No mass or other significant abnormality Other:  None. Musculoskeletal: Minimally  displaced acute fractures of the left sacral ala (series 4, image 30). Minimally displaced acute fractures of the anterior left acetabulum (series 4, image 67). Minimally displaced acute fractures of the left inferior pubic ramus. (Series 4, image 93). Status post screw fixation of the left femoral neck and intramedullary screw and nail fixation of the right femoral neck with unchanged appearance and alignment. Osteopenia. IMPRESSION: 1. Minimally displaced acute fractures of the left sacral ala, anterior left acetabulum, and left inferior pubic ramus. 2. Status post screw fixation of the left femoral neck and intramedullary screw and nail fixation of the right femoral neck with unchanged appearance and alignment. 3.  Osteopenia. Electronically Signed   By: Eddie Candle M.D.   On: 09/19/2019 14:31   DG Pelvis Portable  Result Date: 09/19/2019 CLINICAL DATA:  Pain following fall  EXAM: PORTABLE PELVIS 1-2 VIEWS COMPARISON:  March 19, 2019 FINDINGS: Postoperative changes noted in each proximal femur, stable. There is evidence of an old healed fracture of the subcapital femoral neck region on the left. There is an old intertrochanteric fracture on the right. No acute fracture or dislocation is demonstrable. There is moderate narrowing of each hip joint. Bones are osteoporotic. There is a stent on the right extending into the medial right thigh. IMPRESSION: Postoperative changes with evidence of old fractures bilaterally, unchanged in alignment compared to prior study. No acute appearing fracture or dislocation. Bones osteoporotic. Narrowing each hip joint is stable. Electronically Signed   By: Lowella Grip III M.D.   On: 09/19/2019 10:12   DG Chest Portable 1 View  Result Date: 09/19/2019 CLINICAL DATA:  Pain following fall EXAM: PORTABLE CHEST 1 VIEW COMPARISON:  August 07, 2019 FINDINGS: There is a small left pleural effusion. Lungs otherwise are clear. Heart size and pulmonary vascularity are  normal. No adenopathy. Aortic valve replacement noted. Apparent old fracture proximal left humerus with remodeling. Healed fracture of the lateral left ninth rib. Acute appearing fracture of the lateral left clavicle noted with slight impaction in this area. IMPRESSION: Acute appearing fracture lateral left clavicle. Old healed fractures at other sites noted. Small left pleural effusion. Lungs elsewhere clear. No pneumothorax. Stable cardiac silhouette. Status post aortic valve replacement. Electronically Signed   By: Lowella Grip III M.D.   On: 09/19/2019 10:14   DG Femur Min 2 Views Left  Result Date: 09/19/2019 CLINICAL DATA:  Fall, left-sided pain EXAM: LEFT FEMUR 2 VIEWS COMPARISON:  09/17/2019 left femur radiograph FINDINGS: Three fixation pins in the left femoral neck to peer intact with no evidence of loosening. Healed deformity in the left femoral head and neck with articular collapse. No acute osseous fracture. No left hip dislocation. Moderate osteoarthritis in the weight-bearing portion of the left hip. No focal osseous lesions. IMPRESSION: No acute osseous fracture. Healed deformity in the left femoral head and neck with fixation pins in the left femoral neck, without hardware complication. Osteoarthritis in the weight-bearing portion of the left hip. Electronically Signed   By: Ilona Sorrel M.D.   On: 09/19/2019 11:08    Review of Systems  HENT: Negative for ear discharge, ear pain, hearing loss and tinnitus.   Eyes: Negative for photophobia and pain.  Respiratory: Negative for cough and shortness of breath.   Cardiovascular: Negative for chest pain.  Gastrointestinal: Negative for abdominal pain, nausea and vomiting.  Genitourinary: Negative for dysuria, flank pain, frequency and urgency.  Musculoskeletal: Positive for arthralgias (Left hip) and joint swelling (Left elbow). Negative for back pain, myalgias and neck pain.  Neurological: Negative for dizziness and headaches.   Hematological: Does not bruise/bleed easily.  Psychiatric/Behavioral: The patient is not nervous/anxious.    Blood pressure 139/69, pulse 89, temperature 99.2 F (37.3 C), temperature source Oral, resp. rate 16, height 5' (1.524 m), weight 46.3 kg, SpO2 99 %. Physical Exam  Constitutional: She appears well-developed and well-nourished. No distress.  HENT:  Head: Normocephalic and atraumatic.  Eyes: Conjunctivae are normal. Right eye exhibits no discharge. Left eye exhibits no discharge. No scleral icterus.  Cardiovascular: Normal rate and regular rhythm.  Respiratory: Effort normal. No respiratory distress.  Musculoskeletal:     Cervical back: Normal range of motion.     Comments: Left shoulder, elbow, wrist, digits- no skin wounds, nontender, no instability, no blocks to motion, proximal forearm hematoma  Sens  Ax/R/M/U intact  Mot   Ax/ R/ PIN/ M/ AIN/ U intact  Rad 2+  Pelvis--no traumatic wounds or rash, no ecchymosis, stable to manual stress, TTP with lateral compression    Neurological: She is alert.  Skin: Skin is warm and dry. She is not diaphoretic.  Psychiatric: She has a normal mood and affect. Her behavior is normal.    Assessment/Plan: Pelvic fxs -- She may be WBAT BLE for these fxs. They should not delay her THA scheduled for the middle of April. She should f/u with Dr. Ninfa Linden as scheduled. Left clav fx -- She may use sling for comfort but may be WBAT for ambulation Left forearm hematoma -- No treatment necessary. Exam not concerning for fx. Multiple medical problems including RA; PVD; HTN; and AS s/p TAVR -- per primary service    Lisette Abu, PA-C Orthopedic Surgery [phone] 09/19/2019, 3:23 PM

## 2019-09-20 LAB — CBC
HCT: 36.6 % (ref 36.0–46.0)
Hemoglobin: 11.3 g/dL — ABNORMAL LOW (ref 12.0–15.0)
MCH: 28.3 pg (ref 26.0–34.0)
MCHC: 30.9 g/dL (ref 30.0–36.0)
MCV: 91.7 fL (ref 80.0–100.0)
Platelets: 199 10*3/uL (ref 150–400)
RBC: 3.99 MIL/uL (ref 3.87–5.11)
RDW: 13.6 % (ref 11.5–15.5)
WBC: 8 10*3/uL (ref 4.0–10.5)
nRBC: 0 % (ref 0.0–0.2)

## 2019-09-20 LAB — BASIC METABOLIC PANEL
Anion gap: 12 (ref 5–15)
BUN: 24 mg/dL — ABNORMAL HIGH (ref 8–23)
CO2: 23 mmol/L (ref 22–32)
Calcium: 9.2 mg/dL (ref 8.9–10.3)
Chloride: 106 mmol/L (ref 98–111)
Creatinine, Ser: 0.95 mg/dL (ref 0.44–1.00)
GFR calc Af Amer: >60 mL/min (ref >60)
GFR calc non Af Amer: 55 mL/min — ABNORMAL LOW (ref >60)
Glucose, Bld: 96 mg/dL (ref 70–99)
Potassium: 3.8 mmol/L (ref 3.5–5.1)
Sodium: 141 mmol/L (ref 135–145)

## 2019-09-20 MED ORDER — ENSURE ENLIVE PO LIQD
237.0000 mL | Freq: Three times a day (TID) | ORAL | Status: DC
  Administered 2019-09-20 – 2019-09-21 (×3): 237 mL via ORAL

## 2019-09-20 MED ORDER — ADULT MULTIVITAMIN W/MINERALS CH
1.0000 | ORAL_TABLET | Freq: Every day | ORAL | Status: DC
  Administered 2019-09-20 – 2019-09-21 (×2): 1 via ORAL
  Filled 2019-09-20: qty 1

## 2019-09-20 NOTE — Plan of Care (Signed)
  Problem: Education: Goal: Verbalization of understanding the information provided (i.e., activity precautions, restrictions, etc) will improve Outcome: Progressing Goal: Individualized Educational Video(s) Outcome: Not Applicable

## 2019-09-20 NOTE — TOC Initial Note (Addendum)
Transition of Care [facility]) - Initial/Assessment Note    Patient Details  Name: Michaela Simpson MRN: [id number] Date of Birth: 1931-12-16  [facility] CM/SW Contact:    Sharin Mons, RN Phone Number: 09/20/2019, 11:38 AM  Clinical Narrative:       Presents with fall, suffered pelvic and L clavicle fxs. Hx of RA; PVD; HTN; and AS s/p TAVR. From home alone. Supportive daughters.       Adolph Pollack (Daughter) [name] (Daughter)    [phone]     NCM spoke with pt and daughter Michaela Simpson @ bedside regarding d/c planning. Pt states she's scheduled for hip repair mid April. Agreeable to [facility] services with [facility] if d/c to home awaiting hip repair surgery. Daughter Michaela Simpson states mom will have 24/7 supervision/assist once d/c.  PT eval/recommendation shared with pt/daughter: Home health PT;Supervision/Assistance - 24 hour;Supervision for mobility/OOB;Other (comment)(If pt were to have L hip surgery while admitted rec SNF)   Referral made with [facility] services pending MD's orders...  Daughter Michaela Simpson states pt will have transportation to home .   TOC team will continue to monitor and follow for needs.  Expected Discharge Plan: [place] Barriers to Discharge: Continued Medical Work up   Patient Goals and CMS Choice Patient states their goals for this hospitalization and ongoing recovery are:: to feel better   Choice offered to / list presented to : Patient, Adult Children  Expected Discharge Plan and Services Expected Discharge Plan: [place]   Discharge Planning Services: CM Consult   Living arrangements for the past 2 months: Single Family Home                           HH Arranged: PT, OT [facility] Agency: [place] ([place]) Date HH Agency Contacted: 09/20/19 Time HH Agency Contacted: [phone]    Prior Living Arrangements/Services Living arrangements for the past 2 months: [place]  with:: Self Patient language and need for interpreter reviewed:: Yes        Need for Family Participation in Patient Care: Yes (Comment) Care giver support system in place?: Yes (comment)   Criminal Activity/Legal Involvement Pertinent to Current Situation/Hospitalization: No - Comment as needed  Activities of Daily Living Home Assistive Devices/Equipment: Cane (specify quad or straight), Walker (specify type) ADL Screening (condition at time of admission) Patient's cognitive ability adequate to safely complete daily activities?: Yes Is the patient deaf or have difficulty hearing?: No Does the patient have difficulty seeing, even when wearing glasses/contacts?: No Does the patient have difficulty concentrating, remembering, or making decisions?: No Patient able to express need for assistance with ADLs?: Yes Does the patient have difficulty dressing or bathing?: Yes Independently performs ADLs?: Yes (appropriate for developmental age) Does the patient have difficulty walking or climbing stairs?: Yes Weakness of Legs: Left Weakness of Arms/Hands: None  Permission Sought/Granted Permission sought to share information with : Case Manager Permission granted to share information with : Yes, Verbal Permission Granted  Share Information with NAME: Adolph Pollack ([phone] [name] (Daughter)[phone]           Emotional Assessment Appearance:: Appears stated age Attitude/Demeanor/Rapport: Gracious Affect (typically observed): Accepting Orientation: : Oriented to Self, Oriented to Place, Oriented to  Time, Oriented to Situation Alcohol / Substance Use: Not Applicable Psych Involvement: No (comment)  Admission diagnosis:  Fall [W19.XXXA] Closed displaced fracture of anterior wall of left  acetabulum, initial encounter [facility]) [S32.412A] Closed fracture of ramus of left pubis, initial encounter ([place]) [S32.592A] Closed fracture of sacrum, unspecified portion of sacrum,  initial encounter [facility]) [S32.10XA] Patient Active Problem List   Diagnosis Date Noted  . Closed pelvic fracture ([place]) 09/19/2019  . S/P TAVR (transcatheter aortic valve replacement) 08/08/2019  . Severe aortic stenosis 07/19/2019  . Essential hypertension, benign 03/15/2019  . Sequelae of open wound of right lower extremity 03/15/2019  . Major depression, recurrent, chronic ([place]) 03/15/2019  . Chronic constipation 03/15/2019  . Closed left humeral fracture 03/08/2019  . Wound of right leg, initial encounter 03/08/2019  . Leukocytosis 03/08/2019  . Fracture of femoral neck, left, closed ([place]) 03/08/2019  . AKI (acute kidney injury) ([place]) 03/08/2019  . Depression   . HTN (hypertension)   . Bilateral carpal tunnel syndrome 05/15/2018  . Rash and nonspecific skin eruption 04/06/2015  . Acute blood loss anemia 03/22/2015  . Edema 03/21/2015  . Acute renal failure syndrome ([place])   . Closed right hip fracture ([place]) 03/14/2015  . Rheumatoid arthritis ([place]) 03/14/2015  . Fall 03/14/2015   PCP:  Celene Squibb, MD Pharmacy:   [facility] [place], [place] AT [place] [id number] [place] [id number] Phone: [phone] Fax: [phone]     Social Determinants of Health (SDOH) Interventions    Readmission Risk Interventions Readmission Risk Prevention Plan 08/08/2019  Post Dischage Appt Complete  Medication Screening Complete  Transportation Screening Complete  Some recent data might be hidden

## 2019-09-20 NOTE — Progress Notes (Signed)
Initial Nutrition Assessment  DOCUMENTATION CODES:   Severe malnutrition in context of chronic illness  INTERVENTION:   -Ensure Enlive po TID, each supplement provides 350 kcal and 20 grams of protein -MVI with minerals daily  NUTRITION DIAGNOSIS:   Severe Malnutrition related to chronic illness(PVD) as evidenced by moderate fat depletion, severe fat depletion, moderate muscle depletion, severe muscle depletion.  GOAL:   Patient will meet greater than or equal to 90% of their needs  MONITOR:   PO intake, Supplement acceptance, Labs, Weight trends, Skin, I & O's  REASON FOR ASSESSMENT:   Consult Hip fracture protocol  ASSESSMENT:   Michaela Simpson is a 86 y.o. female with medical history significant of RA; PVD; HTN; and s/p TAVR presenting with a fall.  She saw Dr. Junius Roads Monday for a fall Sunday - got up by herself and felt ok; the next morning she felt too sore to get up.  She went home and lives alone.  She is anticipating a hip replacement.  At that appointment Monday, xrays were negative and she released to home.  She did ok at home independently.  This AM, she had her usual routine and was getting breakfast ready.  She thought there was a fire inside the oven and turned quickly and fell.  She managed to get herself up the table and was sitting in pain.  She pushed her life alert and it notified her family, who came to check on her.  The oven was turned off and the breakfast did not appear to be burned.  She had a TAVR last month and is on Plavix.  Her left leg is very painful now.  She is unable to bear weight at this time.  Pt admitted with pelvic fractures and lt clavilce fracture s/p fall.   Reviewed I/O's: +801 ml x 24 hours  UOP: 200 ml x 24 hours  Per orthopedics notes, no surgical interventions at this time. Plan for THA scheduled for mid April.   Spoke with pt, who was sitting in recliner chair at time of visit, sipping on Ensure. Pt shares that she has had a  lot of challenges over the past couple of months, which started when she accidentally dropped a pair of pruning shears on her rt thigh and was treated at the wound center. Pt repots her appetite has been poor and was consuming 3-4 Ensure supplements per day. She typically consumes 3 small meals per day (Breakfast: coffee, juice, and English muffin with homey; Lunch: half sandwich; and Dinner: spaghetti). Per meal completion records, meal completion 50-75%  Pt very concerned about her weight loss. She shares that she usually weighs about 140#, but now weighs about 100#. Reviewed wt hx; pt has experienced a 7.2% wt loss over the past 3 months. While this is not significant for time frame, it remains concerning given her hisotry. She has been working hard on trying to regain lost weight. RD discussed ways to increase calories and protein in her diet. Pt is amenable to continue Ensure supplements per her home regimen.   Medications reviewed and include colace.   Labs reviewed.   NUTRITION - FOCUSED PHYSICAL EXAM:    Most Recent Value  Orbital Region  Severe depletion  Upper Arm Region  Severe depletion  Thoracic and Lumbar Region  Moderate depletion  Buccal Region  Moderate depletion  Temple Region  Severe depletion  Clavicle Bone Region  Severe depletion  Clavicle and Acromion Bone Region  Severe depletion  Scapular  Bone Region  Severe depletion  Dorsal Hand  Severe depletion  Patellar Region  Moderate depletion  Anterior Thigh Region  Moderate depletion  Posterior Calf Region  Moderate depletion  Edema (RD Assessment)  None  Hair  Reviewed  Eyes  Reviewed  Mouth  Reviewed  Skin  Reviewed  Nails  Reviewed       Diet Order:   Diet Order            Diet Heart Room service appropriate? Yes; Fluid consistency: Thin  Diet effective now              EDUCATION NEEDS:   Education needs have been addressed  Skin:  Skin Assessment: Skin Integrity Issues: Skin Integrity Issues::  Other (Comment) Other: open wound to rt ankle  Last BM:  Unknown  Height:   Ht Readings from Last 1 Encounters:  09/19/19 5' (1.524 m)    Weight:   Wt Readings from Last 1 Encounters:  09/19/19 46.3 kg    Ideal Body Weight:  45.5 kg  BMI:  Body mass index is 19.94 kg/m.  Estimated Nutritional Needs:   Kcal:  1400-1600  Protein:  60-75 grams  Fluid:  > 1.4 L    [name], RD, LDN, [place] Registered Dietitian II Certified Diabetes Care and Education Specialist Please refer to [facility] for RD and/or RD on-call/weekend/after hours pager

## 2019-09-20 NOTE — Evaluation (Signed)
Physical Therapy Evaluation Patient Details Name: Michaela Simpson MRN: [id number] DOB: [birth year] Today's Date: 09/20/2019   History of Present Illness  Michaela Simpson is a 86 y.o. female with medical history significant of RA; PVD; HTN; and s/p TAVR presenting with a fall. Presented after a fall on blood thinner.  Head CT negative.  Broken acetabulum, sacrum, and pubic ramus, & L clavical.  Recent TAVR, on Plavix.  She was hospitalized in 02/2019 for a mechanical fall resulting in a left hip fracture and left humerus fracture. She underwent ORIF of the hip fracture and nonoperative tx of the humerus fx.  Clinical Impression  PTA patient was ambulatory with [facility] and independent in ADLs. Pt admitted with above diagnosis. Pt's had 2 falls recently with injuries leading to increased fear of falling. Overall patient demonstrates functional strength of LE and UE with transfers. Pt has scheduled a L hip replacement in the next 2 weeks and her recommended discharge could change if surgery is performed in the next few days since she has been admitted to the hospital secondary to fall. Currently anticipate patient will be safe to return home with 24/7 care as she is CGA-supervision level with most transfers today and able to ambulate the room with CGA/supervision safely today. Due to frequent falls recently patient needs 24/7 support to maintain & improve frequent, independent mobility to increase LE strength for upcoming surgery.  Pt currently with functional limitations due to the deficits listed below (see PT Problem List). Pt will benefit from skilled PT to increase their independence and safety with mobility to allow discharge to the venue listed below.       Follow Up Recommendations Home health PT;Supervision/Assistance - 24 hour;Supervision for mobility/OOB;Other (comment)(If pt were to have L hip surgery while admitted rec SNF)    Equipment Recommendations  Other (comment)(TBD upon figuring out  discharge POC)    Recommendations for Other Services OT consult     Precautions / Restrictions Precautions Precautions: Fall Restrictions Weight Bearing Restrictions: Yes LUE Weight Bearing: Weight bearing as tolerated RLE Weight Bearing: Weight bearing as tolerated LLE Weight Bearing: Weight bearing as tolerated      Mobility  Bed Mobility Overal bed mobility: Needs Assistance Bed Mobility: Supine to Sit     Supine to sit: Min assist     General bed mobility comments: minA for sequencing and scooting  Transfers Overall transfer level: Needs assistance Equipment used: Rolling walker (2 wheeled) Transfers: Sit to/from Stand Sit to Stand: Min assist;Min guard         General transfer comment: Pt required minA initially for power up, no additional assistance for toilet transfers/gait. Required assistance with donning/doffing underwear during toileting transfer  Ambulation/Gait Ambulation/Gait assistance: Min guard;Supervision Gait Distance (Feet): 15 Feet(x2) Assistive device: Rolling walker (2 wheeled) Gait Pattern/deviations: Step-through pattern;Decreased stride length;Decreased stance time - left;Trunk flexed;Antalgic(LE ER) Gait velocity: decreased   General Gait Details: L antalgic gait with increased time, overall functional gait for home mobility currently     Balance Overall balance assessment: Needs assistance Sitting-balance support: No upper extremity supported Sitting balance-Leahy Scale: Fair     Standing balance support: No upper extremity supported Standing balance-Leahy Scale: Good Standing balance comment: Pt able to stand and apply pad to underwear, perform pericare without assistance, shift weight safely forward/backwards            Pertinent Vitals/Pain Pain Assessment: Faces Faces Pain Scale: Hurts little more Pain Location: L hip Pain Descriptors / Indicators: Aching;Grimacing;Guarding Pain  Intervention(s): Limited activity within  patient's tolerance;Monitored during session    [place] expects to be discharged to:: Private residence Living Arrangements: Alone Available Help at Discharge: Family;Friend(s);Available 24 hours/day;Personal care attendant(Pt has 2 weeks of care set up for after surgery/hospital) Type of Home: House(townhouse) Home Access: Stairs to enter Entrance Stairs-Rails: Can reach both Entrance Stairs-Number of Steps: 1 Home Layout: One level Home Equipment: Walker - 2 wheels;Cane - single point;Wheelchair - manual;Toilet riser      Prior Function Level of Independence: Independent with assistive device(s)         Comments: SPC was main device     Hand Dominance   Dominant Hand: Right    Extremity/Trunk Assessment   Upper Extremity Assessment Upper Extremity Assessment: Defer to OT evaluation    Lower Extremity Assessment Lower Extremity Assessment: Generalized weakness       Communication   Communication: No difficulties  Cognition Arousal/Alertness: Awake/alert Behavior During Therapy: WFL for tasks assessed/performed Overall Cognitive Status: Within Functional Limits for tasks assessed           General Comments General comments (skin integrity, edema, etc.): VSS throughout, Pt on 1.5L O2NC upon approach, pt performed functional mobility around room on RA with O2 sats 99-100% during and following.     Exercises Other Exercises Other Exercises: Educated on balance activities and progression   Assessment/Plan    PT Assessment Patient needs continued PT services  PT Problem List Decreased strength;Decreased activity tolerance;Decreased mobility;Decreased balance;Pain;Other (comment)(fear of falling)       PT Treatment Interventions Gait training;DME instruction;Stair training;Functional mobility training;Therapeutic activities;Therapeutic exercise;Balance training;Neuromuscular re-education;Patient/family education;Modalities    PT Goals  (Current goals can be found in the Care Plan section)  Acute Rehab PT Goals Patient Stated Goal: Have L hip replaced, go to rehab, and transition home safely PT Goal Formulation: With patient Time For Goal Achievement: 10/04/19 Potential to Achieve Goals: Fair    Frequency Min 3X/week    AM-PAC PT "6 Clicks" Mobility  Outcome Measure Help needed turning from your back to your side while in a flat bed without using bedrails?: None Help needed moving from lying on your back to sitting on the side of a flat bed without using bedrails?: A Little Help needed moving to and from a bed to a chair (including a wheelchair)?: A Little Help needed standing up from a chair using your arms (e.g., wheelchair or bedside chair)?: None Help needed to walk in hospital room?: A Little Help needed climbing 3-5 steps with a railing? : A Lot 6 Click Score: 19    End of Session Equipment Utilized During Treatment: Gait belt Activity Tolerance: Patient tolerated treatment well Patient left: in chair;with call bell/phone within reach;with chair alarm set Nurse Communication: Mobility status;Other (comment)(Recommendations based on POC) PT Visit Diagnosis: History of falling (Z91.81);Muscle weakness (generalized) (M62.81);Other abnormalities of gait and mobility (R26.89);Difficulty in walking, not elsewhere classified (R26.2);Pain Pain - Right/Left: Left Pain - part of body: Hip    Time: [id number] PT Time Calculation (min) (ACUTE ONLY): 50 min   Charges:   PT Evaluation $PT Eval Moderate Complexity: 1 Mod PT Treatments $Gait Training: 8-22 mins $Therapeutic Activity: 8-22 mins        Ann Held PT, DPT Acute Rehab [facility] P: [phone]   Ermagene Saidi A Moishy Laday 09/20/2019, 11:33 AM
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
## 2019-09-21 DIAGNOSIS — E43 Unspecified severe protein-calorie malnutrition: Secondary | ICD-10-CM

## 2019-09-21 HISTORY — DX: Unspecified severe protein-calorie malnutrition: E43

## 2019-09-21 LAB — BASIC METABOLIC PANEL
Anion gap: 13 (ref 5–15)
BUN: 25 mg/dL — ABNORMAL HIGH (ref 8–23)
CO2: 23 mmol/L (ref 22–32)
Calcium: 8.9 mg/dL (ref 8.9–10.3)
Chloride: 100 mmol/L (ref 98–111)
Creatinine, Ser: 0.9 mg/dL (ref 0.44–1.00)
GFR calc Af Amer: >60 mL/min (ref >60)
GFR calc non Af Amer: 58 mL/min — ABNORMAL LOW (ref >60)
Glucose, Bld: 103 mg/dL — ABNORMAL HIGH (ref 70–99)
Potassium: 4.4 mmol/L (ref 3.5–5.1)
Sodium: 136 mmol/L (ref 135–145)

## 2019-09-21 LAB — CBC
HCT: 35.7 % — ABNORMAL LOW (ref 36.0–46.0)
Hemoglobin: 11.1 g/dL — ABNORMAL LOW (ref 12.0–15.0)
MCH: 28.2 pg (ref 26.0–34.0)
MCHC: 31.1 g/dL (ref 30.0–36.0)
MCV: 90.6 fL (ref 80.0–100.0)
Platelets: 190 10*3/uL (ref 150–400)
RBC: 3.94 MIL/uL (ref 3.87–5.11)
RDW: 13.6 % (ref 11.5–15.5)
WBC: 8.7 10*3/uL (ref 4.0–10.5)
nRBC: 0 % (ref 0.0–0.2)

## 2019-09-21 LAB — MAGNESIUM: Magnesium: 2.1 mg/dL (ref 1.7–2.4)

## 2019-09-21 MED ORDER — HYDROCODONE-ACETAMINOPHEN 5-325 MG PO TABS: 1.0000 | ORAL_TABLET | Freq: Four times a day (QID) | ORAL | 0 refills | Status: DC | PRN

## 2019-09-21 MED ORDER — BISACODYL 10 MG RE SUPP
10.0000 mg | Freq: Once | RECTAL | Status: AC
  Administered 2019-09-21: 10 mg via RECTAL

## 2019-09-21 NOTE — Plan of Care (Signed)
  Problem: Education: Goal: Verbalization of understanding the information provided (i.e., activity precautions, restrictions, etc) will improve Outcome: Progressing   

## 2019-09-21 NOTE — Discharge Instructions (Signed)
Simple Pelvic Fracture, Adult A pelvic fracture is a break in one of the bones in the pelvis. The pelvic bones include the bones that you sit on and the bones that make up the lower part of your spine. A pelvic fracture is called simple if:  There is only one break.  The broken bone is stable.  The broken bone is not moving out of place.  The bone does not pierce the skin. A pelvic fracture may occur along with injuries to nerves, blood vessels, soft tissues, the urinary tract, and abdominal organs. What are the causes? Common causes of this type of fracture include:  A fall.  A car accident.  Force or pressure that hits the pelvis. What increases the risk? You are more likely to get this injury if you:  Play high-impact sports, such as rugby or football.  You have thinning or weakening of your bones, such as from osteopenia or osteoporosis.  Have cancer that has spread to the bone.  Have a condition that is associated with falling, such as Parkinson's disease or seizure.  Have had a stroke.  Smoke. What are the signs or symptoms? Signs and symptoms may include:  Tenderness, swelling, or bruising in the affected area.  Pain when moving the hip.  Pain when walking or standing. How is this diagnosed? This condition is diagnosed with a physical exam, X-ray, or CT scan. You may also have blood or urine tests:  To rule out damage to other organs, such as the urethra.  To check for internal bleeding in the pelvic area. How is this treated? The goal of treatment is to get the bone to heal in its original position. Treatment includes:  Staying in bed (bed rest).  Using crutches, a walker, or a wheelchair until the bone heals.  Medicines to treat pain.  Medicines to prevent blood clots from forming in your legs.  Physical therapy. Follow these instructions at home: Medicines  Take over-the-counter and prescription medicines only as told by your health care  provider.  Do not drive or use heavy machinery while taking prescription pain medicine. Managing pain, stiffness, and swelling   If directed, apply ice to the injured area: ? Put ice in a plastic bag. ? Place a towel between your skin and the bag. ? Leave the ice on for 20 minutes, 2-3 times a day.  Gently move your toes often to avoid stiffness and to lessen swelling. Activity  Stay on bed rest for as long as directed by your health care provider.  While on bed rest: ? Change the position of your legs every 1-2 hours. This keeps blood moving well through both of your legs. ? You may sit for as long as you feel comfortable.  After bed rest: ? Avoid strenuous activities for as long as directed by your health care provider. ? Return to your normal activities as directed by your health care provider. Ask your health care provider what activities are safe for you.  Use items to help you with your activities, such as: ? A long-handled shoehorn to help you put your shoes on. ? Elastic shoelaces that do not need to be retied. ? A reacher or grabber to pick items up off the floor. General instructions   Do not  drive or operate heavy machinery until your health care provider tells you it is safe to do so.  Use a wheelchair or assistive devices as directed by your health care provider. When you are   ready to walk, start by using crutches or a walker to help support your body weight.  Have someone help you at home as you recover.  Wear compression stockings as told by your health care provider.  Do not use any products that contain nicotine or tobacco, such as cigarettes and e-cigarettes. These can delay bone healing. If you need help quitting, ask your health care provider.  If you have an underlying condition that caused your pelvic fracture, work with your health care provider to manage your condition.  Keep all follow-up visits as told by your health care provider. This is  important. Contact a health care provider if:  Your pain gets worse.  Your pain is not relieved with medicines. Get help right away if you:  Feel light-headed or faint.  Develop chest pain.  Develop shortness of breath.  Have a fever.  Have blood in your urine or your stools.  Have bleeding in your vagina.  Have difficulty or pain with urination or with passing stool.  Have difficulty or increased pain with walking.  Have new or increased swelling in one of your legs.  Have numbness in your legs or groin area. Summary  A pelvic fracture is a break in one of the bones in the pelvis. These are the bones that you sit on and the bones that make up the lower part of your spine.  A pelvic fracture is called simple if there is only one break, the broken bone is stable, the broken bone is not moving out of place, or the bone does not pierce the skin.  Common causes of this type of fracture include a fall, a car accident, or a force or pressure that hits the pelvis.  The goal of treatment is to get the bone to heal in its original position.  Treatment includes bed rest and using a wheelchair. When ready to walk, you may use crutches or a walker until your bone heals. Other treatments include physical therapy and medicine to treat pain and prevent blood clots. This information is not intended to replace advice given to you by your health care provider. Make sure you discuss any questions you have with your health care provider. Document Revised: 01/12/2018 Document Reviewed: 07/18/2017 Elsevier Patient Education  2020 [employer]  

## 2019-09-21 NOTE — TOC Transition Note (Signed)
Transition of Care [facility]) - CM/SW Discharge Note   Patient Details  Name: Michaela Simpson MRN: [id number] Date of Birth: February 12, 1932  Transition of Care [facility]) CM/SW Contact:  Sharin Mons, RN Phone Number: 09/21/2019, 1:43 PM   Clinical Narrative:    Pt will transition to home today with home health services. Daughter to provide transportation to home. DME: 3 in 1 /[facility] will be delivered to bedside prior to d/c.   Final next level of care: Home w Home Health Services Barriers to Discharge: No Barriers Identified   Patient Goals and CMS Choice Patient states their goals for this hospitalization and ongoing recovery are:: to feel better   Choice offered to / list presented to : Patient, Adult Children  Discharge Placement     Discharge Plan and Services   Discharge Planning Services: CM Consult            DME Arranged: 3-N-1 DME Agency: [employer] Date DME Agency Contacted: 09/21/19 Time DME Agency Contacted: 1343   [place] Arranged: PT, OT HH Agency: [place] ([place]) Date HH Agency Contacted: 09/20/19 Time [place]: [id number]    Social Determinants of Health (SDOH) Interventions     Readmission Risk Interventions Readmission Risk Prevention Plan 08/08/2019  Post Dischage Appt Complete  Medication Screening Complete  Transportation Screening Complete  Some recent data might be hidden

## 2019-09-21 NOTE — Progress Notes (Addendum)
Occupational Therapy Treatment Patient Details Name: Michaela Simpson MRN: [id number] DOB: Jul 24, 1931 Today's Date: 09/21/2019    History of present illness Michaela Simpson is a 86 y.o. female with medical history significant of RA; PVD; HTN; and s/p TAVR presenting with a fall. Presented after a fall on blood thinner.  Head CT negative.  Broken acetabulum, sacrum, and pubic ramus, & L clavical.  Recent TAVR, on Plavix.  She was hospitalized in 02/2019 for a mechanical fall resulting in a left hip fracture and left humerus fracture. She underwent ORIF of the hip fracture and nonoperative tx of the humerus fx.   OT comments  Pt progressing towards established OT goals. Providing education on compensatory techniques for UB dressing and LB dressing with AE. Pt donning night gown with supervision. Pt donning underwear and socks with AE and Min Guard A. Pt performing functional mobility with Min Guard A and RW. Answering questions in preparation for dc to home later today. Continue to recommend dc to home with HHOT and 24/7 supervision.    Follow Up Recommendations  Home health OT;Supervision/Assistance - 24 hour    Equipment Recommendations  3 in 1 bedside commode    Recommendations for Other Services      Precautions / Restrictions Precautions Precautions: Fall Restrictions Weight Bearing Restrictions: Yes LUE Weight Bearing: Weight bearing as tolerated RLE Weight Bearing: Weight bearing as tolerated LLE Weight Bearing: Weight bearing as tolerated       Mobility Bed Mobility               General bed mobility comments: In recliner upon arrival  Transfers Overall transfer level: Needs assistance Equipment used: Rolling walker (2 wheeled) Transfers: Sit to/from Stand Sit to Stand: Min guard;Supervision         General transfer comment: Min Guard A for safety    Balance Overall balance assessment: Needs assistance Sitting-balance support: No upper extremity  supported Sitting balance-Leahy Scale: Good     Standing balance support: No upper extremity supported Standing balance-Leahy Scale: Fair Standing balance comment: Pt able to maintain static standing without UE support while pulling up underwear. Requiring Close Min guard A for safety and occational Min A for slight posterior lean                           ADL either performed or assessed with clinical judgement   ADL Overall ADL's : Needs assistance/impaired     Grooming: Set up;Supervision/safety;Sitting;Wash/dry hands;Wash/dry face;Oral care;Applying deodorant;Brushing hair           Upper Body Dressing : Sitting;Supervision/safety Upper Body Dressing Details (indicate cue type and reason): Educating pt on donning LUE first to reduce pain. Pt donning night gown with Supervision. Lower Body Dressing: Min guard;Minimal assistance;With adaptive equipment;Sit to/from stand Lower Body Dressing Details (indicate cue type and reason): Providing education on donning underwear with reacher. Pt donning underwear and then requiring Min Guard-Min A for standing balance.  Toilet Transfer: Min guard;Comfort height toilet;BSC;RW           Functional mobility during ADLs: Min guard;Rolling walker General ADL Comments: Pt demonstrating increased balance, strength, and activity tolerance.     Vision       Perception     Praxis      Cognition Arousal/Alertness: Awake/alert Behavior During Therapy: WFL for tasks assessed/performed Overall Cognitive Status: Within Functional Limits for tasks assessed  Exercises     Shoulder Instructions       General Comments      Pertinent Vitals/ Pain       Pain Assessment: Faces Faces Pain Scale: Hurts a little bit Pain Location: L hip Pain Descriptors / Indicators: Aching;Grimacing;Guarding Pain Intervention(s): Monitored during session;Limited activity within  patient's tolerance;Repositioned  Home Living                                          Prior Functioning/Environment              Frequency  Min 2X/week        Progress Toward Goals  OT Goals(current goals can now be found in the care plan section)  Progress towards OT goals: Progressing toward goals  Acute Rehab OT Goals Patient Stated Goal: to regain independence and be able to do everything for self  ADL Goals Pt Will Perform Grooming: with supervision;standing Pt Will Perform Lower Body Bathing: with supervision;with adaptive equipment;sit to/from stand Pt Will Perform Lower Body Dressing: with supervision;sit to/from stand;with adaptive equipment Pt Will Transfer to Toilet: with min guard assist;ambulating;bedside commode;grab bars;regular height toilet Pt Will Perform Toileting - Clothing Manipulation and hygiene: with supervision;sit to/from stand Pt Will Perform Tub/Shower Transfer: Shower transfer;with min guard assist;shower seat;ambulating;grab bars;rolling walker  Plan Discharge plan remains appropriate    Co-evaluation                 AM-PAC OT "6 Clicks" Daily Activity     Outcome Measure   Help from another person eating meals?: None Help from another person taking care of personal grooming?: A Little Help from another person toileting, which includes using toliet, bedpan, or urinal?: A Lot Help from another person bathing (including washing, rinsing, drying)?: A Lot Help from another person to put on and taking off regular upper body clothing?: A Lot Help from another person to put on and taking off regular lower body clothing?: Total 6 Click Score: 14    End of Session Equipment Utilized During Treatment: Rolling walker  OT Visit Diagnosis: Unsteadiness on feet (R26.81)   Activity Tolerance Patient tolerated treatment well   Patient Left with call bell/phone within reach;in chair;with chair alarm set   Nurse  Communication Mobility status        Time: [id number] OT Time Calculation (min): 42 min  Charges: OT General Charges $OT Visit: 1 Visit OT Treatments $Self Care/Home Management : 23-37 mins  [place], OTR/L Acute Rehab Pager: [phone] Office: [place] 09/21/2019, 4:37 PM

## 2019-09-21 NOTE — Progress Notes (Addendum)
Physical Therapy Treatment Patient Details Name: Michaela Simpson MRN: [id number] DOB: 05-21-1932 Today's Date: 09/21/2019    History of Present Illness Michaela Simpson is a 86 y.o. female with medical history significant of RA; PVD; HTN; and s/p TAVR presenting with a fall. Presented after a fall on blood thinner.  Head CT negative.  Broken acetabulum, sacrum, and pubic ramus, & L clavical.  Recent TAVR, on Plavix.  She was hospitalized in 02/2019 for a mechanical fall resulting in a left hip fracture and left humerus fracture. She underwent ORIF of the hip fracture and nonoperative tx of the humerus fx.    PT Comments    Pt demonstrated great progress with functional mobility and teach back with fall risk/home modification from OT prior session. Tx focused on gait progression and endurance today. Pt able to ambulate 75 ft with improved postural control and no near LOB evident; pt continues to require VCs for improved heel strike and stride length given antalgic presentation. Pt demonstrated no need for assistance with any transfers today other than CGA/supervision for safety. She continues to have slow movement, with reduced WB LLE due to hip pain which should resolve pending surgery in 2 weeks. Pt reported understanding to walking HEP 1x every hour when awake with full light. PT will continue following acutely until transition to next level of care.   Follow Up Recommendations  Home health PT;Supervision/Assistance - 24 hour;Supervision for mobility/OOB     Equipment Recommendations  3in1 (PT)(due to recent falls)    Recommendations for Other Services OT consult     Precautions / Restrictions Precautions Precautions: Fall Restrictions Weight Bearing Restrictions: Yes LUE Weight Bearing: Weight bearing as tolerated RLE Weight Bearing: Weight bearing as tolerated LLE Weight Bearing: Weight bearing as tolerated    Mobility  Bed Mobility    General bed mobility comments: Pt  OOB in recliner upon arrival  Transfers Overall transfer level: Needs assistance Equipment used: Rolling walker (2 wheeled) Transfers: Sit to/from Stand Sit to Stand: Min guard;Supervision         General transfer comment: CGA for safety only, supervision for second transfer  Ambulation/Gait Ambulation/Gait assistance: Min guard;Supervision Gait Distance (Feet): 75 Feet Assistive device: Rolling walker (2 wheeled) Gait Pattern/deviations: Step-through pattern;Decreased stride length;Decreased stance time - left;Trunk flexed;Antalgic Gait velocity: decreased   General Gait Details: L antalgic gait with increased time, overall functional gait for home mobility currently      Balance Overall balance assessment: Needs assistance Sitting-balance support: No upper extremity supported Sitting balance-Leahy Scale: Good     Standing balance support: No upper extremity supported Standing balance-Leahy Scale: Good Standing balance comment: Pt able to stand without support to rummage through bag, standing tolerance without supervision prior to tx ~2 min no LOB indicated      Cognition Arousal/Alertness: Awake/alert Behavior During Therapy: WFL for tasks assessed/performed Overall Cognitive Status: Within Functional Limits for tasks assessed        Exercises      General Comments General comments (skin integrity, edema, etc.): VSS throughout. No questions regarding fall education by OT the other day.      Pertinent Vitals/Pain Pain Assessment: Faces Faces Pain Scale: Hurts a little bit Pain Location: L hip Pain Descriptors / Indicators: Aching;Grimacing;Guarding           PT Goals (current goals can now be found in the care plan section) Acute Rehab PT Goals Patient Stated Goal: to regain independence and be able to do everything for self  PT Goal Formulation: With patient Time For Goal Achievement: 10/04/19 Potential to Achieve Goals: Fair Progress towards PT goals:  Progressing toward goals    Frequency    Min 3X/week      PT Plan Current plan remains appropriate    Co-evaluation              AM-PAC PT "6 Clicks" Mobility   Outcome Measure  Help needed turning from your back to your side while in a flat bed without using bedrails?: None Help needed moving from lying on your back to sitting on the side of a flat bed without using bedrails?: A Little Help needed moving to and from a bed to a chair (including a wheelchair)?: A Little Help needed standing up from a chair using your arms (e.g., wheelchair or bedside chair)?: None Help needed to walk in hospital room?: A Little Help needed climbing 3-5 steps with a railing? : A Lot 6 Click Score: 19    End of Session Equipment Utilized During Treatment: Gait belt Activity Tolerance: Patient tolerated treatment well Patient left: in chair;with call bell/phone within reach;with chair alarm set Nurse Communication: Mobility status PT Visit Diagnosis: History of falling (Z91.81);Muscle weakness (generalized) (M62.81);Other abnormalities of gait and mobility (R26.89);Difficulty in walking, not elsewhere classified (R26.2);Pain Pain - Right/Left: Left Pain - part of body: Hip     Time: [id number] PT Time Calculation (min) (ACUTE ONLY): 17 min  Charges:  $Gait Training: 8-22 mins                     Ann Held PT, DPT Acute Rehab [facility] P: [phone]    Sharief Wainwright A Robyn Galati 09/21/2019, 12:38 PM

## 2019-09-21 NOTE — Progress Notes (Addendum)
Discharge Instructions given to patient and Daughter Caren Griffins. Verbalized understanding. Awaiting delivery of 3 in 1. Also contacted [facility] to contact MD as they are out of [place] 3 in 1 arrived. Discharged via [place] with daughter.

## 2019-09-21 NOTE — Progress Notes (Addendum)
Occupational Therapy Evaluation (late entry)  Pt admitted with the below listed diagnosis and demonstrates the below listed deficits.  OT eval limited to bed level due to fatigue and just getting pain under control.  Daughter was at bedside.  Currently pt requires set up to total A for ADLs.  Daughter confirms pt will have 24 hour assist at discharge.  Problem solved with them through home environment and recommend she sleep in lower bed, and use 3in1 commode at beside at night time, and use RW at all times to reduce risk of further falls.  She was fully independent and lived alone prior to this admission.  Recommend continued OT while in house, and follow up [place] at discharge, as well as 3in1 commode.      09/20/19 1800  OT Visit Information  Last OT Received On 09/20/19  Assistance Needed +2  History of Present Illness Michaela Simpson is a 86 y.o. female with medical history significant of RA; PVD; HTN; and s/p TAVR presenting with a fall. Presented after a fall on blood thinner.  Head CT negative.  Broken acetabulum, sacrum, and pubic ramus, & L clavical.  Recent TAVR, on Plavix.  She was hospitalized in 02/2019 for a mechanical fall resulting in a left hip fracture and left humerus fracture. She underwent ORIF of the hip fracture and nonoperative tx of the humerus fx.  Precautions  Precautions Fall  Restrictions  Weight Bearing Restrictions Yes  LUE Weight Bearing WBAT  RLE Weight Bearing WBAT  LLE Weight Bearing WBAT  Home Living  Family/patient expects to be discharged to: Private residence  Living Arrangements Alone  Available Help at Discharge Family;Friend(s);Available 24 hours/day;Personal care attendant (Pt has 2 weeks of care set up for after surgery/hospital)  Type of Home House (townhouse)  Home Access Stairs to enter  Entrance Stairs-Number of Steps 1  Entrance Stairs-Rails Can reach both  Home Layout One level  Nurse, learning disability No  [place] - 2 wheels;Cane - single point;Wheelchair - manual;Toilet riser  Additional Comments daughter confirms that pt will have 24 hour assist at discharge   Prior Function  Level of Independence Independent with assistive device(s)  Comments SPC was main device.  Pt with recent falls - Lt hip "gave way".  was using a RW after the first fall   Communication  Communication No difficulties  Pain Assessment  Pain Assessment Faces  Faces Pain Scale 4  Pain Location L hip  Pain Descriptors / Indicators Aching;Grimacing;Guarding  Pain Intervention(s) Limited activity within patient's tolerance  Cognition  Arousal/Alertness Awake/alert  Behavior During Therapy WFL for tasks assessed/performed  Overall Cognitive Status Within Functional Limits for tasks assessed  Upper Extremity Assessment  Upper Extremity Assessment Generalized weakness  Lower Extremity Assessment  Lower Extremity Assessment Defer to PT evaluation  Cervical / Trunk Assessment  Cervical / Trunk Assessment Kyphotic  ADL  Overall ADL's  Needs assistance/impaired  Eating/Feeding Independent  Grooming Wash/dry hands;Wash/dry face;Oral care;Brushing hair;Min guard;Bed level  Upper Body Bathing Set up;Bed level  Lower Body Bathing Maximal assistance;Bed level  Upper Body Dressing  Maximal assistance;Bed level  Lower Body Dressing Total assistance;Bed level  Toilet Transfer Minimal assistance  Toileting- Clothing Manipulation and Hygiene Maximal assistance  General ADL Comments Eval limited to bed level as pt reports she just returned to bed, and pain just getting under control   Vision- History  Patient Visual Report No change from baseline  Bed Mobility  Overal bed mobility Needs Assistance  Bed Mobility Rolling  Rolling Modified independent (Device/Increase time)  Transfers  General transfer comment Pt deferred due to pain just under control   General Comments   General comments (skin integrity, edema, etc.) extensive conversation with pt and daughter re: nature of falls, home set up and safety at home.  They both confirm that pt will have 24 hour assist at discharge.  Pt has a high bed at home requiring a step stool to access bed.  Pt initially resistant to changing home set up, but after lengthy discussion of the risks of further falls and need to maintain safe environment, she was willing to consider changing rooms and sleeping in lower bed.  Also discussed recommendation for 3in1 for use at bedside at night.  Again, pt initially resistant to this suggestion, but given h/o recent falls, and increased risk for falls at night during low light situations, pt agreed to use of 3in1.  Discussed removal of area rugs, need for direct assist for IADLs, and when standing during ADLs, as well as need to always use RW.  They both verbalized agreement   OT - End of Session  Activity Tolerance Patient limited by fatigue;Patient limited by pain  Patient left in bed;with call bell/phone within reach;with bed alarm set;with family/visitor present;with nursing/sitter in room  OT Assessment  OT Recommendation/Assessment Patient needs continued OT Services  OT Visit Diagnosis Unsteadiness on feet (R26.81)  OT Problem List Decreased strength;Impaired balance (sitting and/or standing);Decreased safety awareness;Decreased knowledge of use of DME or AE;Pain  OT Plan  OT Frequency (ACUTE ONLY) Min 2X/week  OT Treatment/Interventions (ACUTE ONLY) Self-care/ADL training;DME and/or AE instruction;Therapeutic activities;Patient/family education;Balance training  AM-PAC OT "6 Clicks" Daily Activity Outcome Measure (Version 2)  Help from another person eating meals? 4  Help from another person taking care of personal grooming? 3  Help from another person toileting, which includes using toliet, bedpan, or urinal? 2  Help from another person bathing (including washing, rinsing, drying)?  2  Help from another person to put on and taking off regular upper body clothing? 2  Help from another person to put on and taking off regular lower body clothing? 1  6 Click Score 14  OT Recommendation  Follow Up Recommendations Home health OT;Supervision/Assistance - 24 hour  OT Equipment 3 in 1 bedside commode  Individuals Consulted  Consulted and Agree with Results and Recommendations Patient;Family member/caregiver  Family Member Consulted daughter   Acute Rehab OT Goals  Patient Stated Goal to regain independence and be able to do everything for self   OT Time Calculation  OT Start Time (ACUTE ONLY) 1420  OT Stop Time (ACUTE ONLY) 1515  OT Time Calculation (min) 55 min  OT General Charges  $OT Visit 1 Visit  OT Evaluation  $OT Eval Moderate Complexity 1 Mod  OT Treatments  $Self Care/Home Management  38-52 mins  Written Expression  Dominant Hand Right   Nilsa [place]ting., OTR/L Acute Rehabilitation Services Pager [phone] Office [phone]

## 2019-09-21 NOTE — Discharge Summary (Signed)
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 is considered an integral part of a complete examination. Limited examinations for reoccurring indications may be performed as noted.  Right Stent(s):superficial femoral-popliteal artery +---------------+---++---------++ Prox to Stent  162triphasic +---------------+---++---------++ Proximal Stent 68 biphasic  +---------------+---++---------++ Mid Stent      70 biphasic  +---------------+---++---------++ Distal Stent   102biphasic  +---------------+---++---------++ Distal to Stent104triphasic +---------------+---++---------++    Summary: See table(s) above for measurements and observations. Electronically signed by Harold Barban MD on 09/10/2019 at 2:01:36 PM.    Final       Discharge Exam: Vitals:   09/21/19 0338 09/21/19 0745  BP: 134/66 140/70  Pulse: 86 86  Resp: 17 14  Temp: 98.3 F (36.8 C) 98.4 F (36.9 C)  SpO2: 92% 94%   Vitals:   09/20/19  1440 09/20/19 2037 09/21/19 0338 09/21/19 0745  BP: 113/61 133/60 134/66 140/70  Pulse: 82 83 86 86  Resp: 18 17 17 14   Temp: 97.9 F (36.6 C) 98 F (36.7 C) 98.3 F (36.8 C) 98.4 F (36.9 C)  TempSrc: Oral Oral Oral Oral  SpO2: 90% 100% 92% 94%  Weight:      Height:        General: Pt is alert, awake, not in acute distress Cardiovascular: RRR, S1/S2 +, no rubs, no gallops Respiratory: CTA bilaterally, no wheezing, no rhonchi Abdominal: Soft, NT, ND, bowel sounds + Extremities: no edema, no cyanosis, multiple scattered bruises.  Slightly decreased strength in left lower extremity due to pain.    The results of significant diagnostics from this hospitalization (including imaging, microbiology, ancillary and laboratory) are listed below for reference.     Microbiology: Recent Results (from the past 240 hour(s))  SARS CORONAVIRUS 2 (TAT 6-24 HRS) Nasopharyngeal Nasopharyngeal Swab     Status: None   Collection Time: 09/19/19  5:01 PM   Specimen: Nasopharyngeal Swab  Result Value Ref Range Status   SARS Coronavirus 2 NEGATIVE NEGATIVE Final    Comment: (NOTE) SARS-CoV-2 target nucleic acids are NOT DETECTED. The SARS-CoV-2 RNA is generally detectable in upper and lower respiratory specimens during the acute phase of infection. Negative results do not preclude SARS-CoV-2 infection, do  not rule out co-infections with other pathogens, and should not be used as the sole basis for treatment or other patient management decisions. Negative results must be combined with clinical observations, patient history, and epidemiological information. The expected result is Negative. Fact Sheet for Patients: SugarRoll.be Fact Sheet for Healthcare Providers: https://www.woods-mathews.com/ This test is not yet approved or cleared by the [place] FDA and  has been authorized for detection and/or diagnosis of SARS-CoV-2 by FDA under an Emergency Use  Authorization (EUA). This EUA will remain  in effect (meaning this test can be used) for the duration of the COVID-19 declaration under Section 56 4(b)(1) of the Act, 21 U.S.C. section 360bbb-3(b)(1), unless the authorization is terminated or revoked sooner. Performed at [facility], [place] [address] [place], [place] [address]      Labs: BNP (last 3 results) Recent Labs    08/03/19 1422  BNP 123456*   Basic Metabolic Panel: Recent Labs  Lab 09/19/19 1023 09/20/19 0216 09/21/19 0523  NA 139 141 136  K 4.2 3.8 4.4  CL 105 106 100  CO2 21* 23 23  GLUCOSE 96 96 103*  BUN 27* 24* 25*  CREATININE 1.09* 0.95 0.90  CALCIUM 9.6 9.2 8.9  MG  --   --  2.1   Liver Function Tests: Recent Labs  Lab 09/19/19 1023  AST 36  ALT 33  ALKPHOS 88  BILITOT 0.6  PROT 7.1  ALBUMIN 4.0   No results for input(s): LIPASE, AMYLASE in the last 168 hours. No results for input(s): AMMONIA in the last 168 hours. CBC: Recent Labs  Lab 09/19/19 1023 09/20/19 0216 09/21/19 0523  WBC 14.6* 8.0 8.7  NEUTROABS 12.9*  --   --   HGB 12.7 11.3* 11.1*  HCT 41.8 36.6 35.7*  MCV 93.5 91.7 90.6  PLT 251 199 190   Cardiac Enzymes: No results for input(s): CKTOTAL, CKMB, CKMBINDEX, TROPONINI in the last 168 hours. BNP: Invalid input(s): POCBNP CBG: No results for input(s): GLUCAP in the last 168 hours. D-Dimer No results for input(s): DDIMER in the last 72 hours. Hgb A1c No results for input(s): HGBA1C in the last 72 hours. Lipid Profile No results for input(s): CHOL, HDL, LDLCALC, TRIG, CHOLHDL, LDLDIRECT in the last 72 hours. Thyroid function studies No results for input(s): TSH, T4TOTAL, T3FREE, THYROIDAB in the last 72 hours.  Invalid input(s): FREET3 Anemia work up No results for input(s): VITAMINB12, FOLATE, FERRITIN, TIBC, IRON, RETICCTPCT in the last 72 hours. Urinalysis    Component Value Date/Time   COLORURINE YELLOW 08/03/2019 1358   APPEARANCEUR HAZY (A) 08/03/2019  1358   LABSPEC 1.021 08/03/2019 1358   PHURINE 5.0 08/03/2019 1358   GLUCOSEU NEGATIVE 08/03/2019 1358   HGBUR NEGATIVE 08/03/2019 1358   BILIRUBINUR NEGATIVE 08/03/2019 1358   KETONESUR NEGATIVE 08/03/2019 1358   PROTEINUR NEGATIVE 08/03/2019 1358   UROBILINOGEN 0.2 03/14/2015 1421   NITRITE NEGATIVE 08/03/2019 1358   LEUKOCYTESUR NEGATIVE 08/03/2019 1358   Sepsis Labs Invalid input(s): PROCALCITONIN,  WBC,  LACTICIDVEN Microbiology Recent Results (from the past 240 hour(s))  SARS CORONAVIRUS 2 (TAT 6-24 HRS) Nasopharyngeal Nasopharyngeal Swab     Status: None   Collection Time: 09/19/19  5:01 PM   Specimen: Nasopharyngeal Swab  Result Value Ref Range Status   SARS Coronavirus 2 NEGATIVE NEGATIVE Final    Comment: (NOTE) SARS-CoV-2 target nucleic acids are NOT DETECTED. The SARS-CoV-2 RNA is generally detectable in upper and lower respiratory specimens during the acute phase of  infection. Negative results do not preclude SARS-CoV-2 infection, do not rule out co-infections with other pathogens, and should not be used as the sole basis for treatment or other patient management decisions. Negative results must be combined with clinical observations, patient history, and epidemiological information. The expected result is Negative. Fact Sheet for Patients: SugarRoll.be Fact Sheet for Healthcare Providers: https://www.woods-mathews.com/ This test is not yet approved or cleared by the [place] FDA and  has been authorized for detection and/or diagnosis of SARS-CoV-2 by FDA under an Emergency Use Authorization (EUA). This EUA will remain  in effect (meaning this test can be used) for the duration of the COVID-19 declaration under Section 56 4(b)(1) of the Act, 21 U.S.C. section 360bbb-3(b)(1), unless the authorization is terminated or revoked sooner. Performed at [facility], [place] [address] [place], [place] [address]       Time coordinating discharge: Over 30 minutes  SIGNED:   Darliss Cheney, MD  [facility] 09/21/2019, 12:56 PM  If 7PM-7AM, please contact night-coverage [email or web address]

## 2019-09-22 DIAGNOSIS — I739 Peripheral vascular disease, unspecified: Secondary | ICD-10-CM | POA: Diagnosis not present

## 2019-09-22 DIAGNOSIS — W19XXXD Unspecified fall, subsequent encounter: Secondary | ICD-10-CM | POA: Diagnosis not present

## 2019-09-22 DIAGNOSIS — I82401 Acute embolism and thrombosis of unspecified deep veins of right lower extremity: Secondary | ICD-10-CM | POA: Diagnosis not present

## 2019-09-22 DIAGNOSIS — E43 Unspecified severe protein-calorie malnutrition: Secondary | ICD-10-CM | POA: Diagnosis not present

## 2019-09-22 DIAGNOSIS — M80052D Age-related osteoporosis with current pathological fracture, left femur, subsequent encounter for fracture with routine healing: Secondary | ICD-10-CM | POA: Diagnosis not present

## 2019-09-22 DIAGNOSIS — I350 Nonrheumatic aortic (valve) stenosis: Secondary | ICD-10-CM | POA: Diagnosis not present

## 2019-09-22 DIAGNOSIS — K5904 Chronic idiopathic constipation: Secondary | ICD-10-CM | POA: Diagnosis not present

## 2019-09-22 DIAGNOSIS — M800AXD Age-related osteoporosis with current pathological fracture, other site, subsequent encounter for fracture with routine healing: Secondary | ICD-10-CM | POA: Diagnosis not present

## 2019-09-22 DIAGNOSIS — I10 Essential (primary) hypertension: Secondary | ICD-10-CM | POA: Diagnosis not present

## 2019-09-22 DIAGNOSIS — Z952 Presence of prosthetic heart valve: Secondary | ICD-10-CM | POA: Diagnosis not present

## 2019-09-22 DIAGNOSIS — F329 Major depressive disorder, single episode, unspecified: Secondary | ICD-10-CM | POA: Diagnosis not present

## 2019-09-22 DIAGNOSIS — Z7982 Long term (current) use of aspirin: Secondary | ICD-10-CM | POA: Diagnosis not present

## 2019-09-22 DIAGNOSIS — M069 Rheumatoid arthritis, unspecified: Secondary | ICD-10-CM | POA: Diagnosis not present

## 2019-09-24 ENCOUNTER — Telehealth: Payer: Self-pay | Admitting: Orthopaedic Surgery

## 2019-09-24 DIAGNOSIS — I10 Essential (primary) hypertension: Secondary | ICD-10-CM | POA: Diagnosis not present

## 2019-09-24 DIAGNOSIS — E441 Mild protein-calorie malnutrition: Secondary | ICD-10-CM | POA: Diagnosis not present

## 2019-09-24 DIAGNOSIS — Z0001 Encounter for general adult medical examination with abnormal findings: Secondary | ICD-10-CM | POA: Diagnosis not present

## 2019-09-24 DIAGNOSIS — M80052D Age-related osteoporosis with current pathological fracture, left femur, subsequent encounter for fracture with routine healing: Secondary | ICD-10-CM | POA: Diagnosis not present

## 2019-09-24 DIAGNOSIS — S72002D Fracture of unspecified part of neck of left femur, subsequent encounter for closed fracture with routine healing: Secondary | ICD-10-CM | POA: Diagnosis not present

## 2019-09-24 DIAGNOSIS — S42412D Displaced simple supracondylar fracture without intercondylar fracture of left humerus, subsequent encounter for fracture with routine healing: Secondary | ICD-10-CM | POA: Diagnosis not present

## 2019-09-24 DIAGNOSIS — L97911 Non-pressure chronic ulcer of unspecified part of right lower leg limited to breakdown of skin: Secondary | ICD-10-CM | POA: Diagnosis not present

## 2019-09-24 DIAGNOSIS — G4700 Insomnia, unspecified: Secondary | ICD-10-CM | POA: Diagnosis not present

## 2019-09-24 DIAGNOSIS — R040 Epistaxis: Secondary | ICD-10-CM | POA: Diagnosis not present

## 2019-09-24 DIAGNOSIS — D464 Refractory anemia, unspecified: Secondary | ICD-10-CM | POA: Diagnosis not present

## 2019-09-24 DIAGNOSIS — F329 Major depressive disorder, single episode, unspecified: Secondary | ICD-10-CM | POA: Diagnosis not present

## 2019-09-24 DIAGNOSIS — M069 Rheumatoid arthritis, unspecified: Secondary | ICD-10-CM | POA: Diagnosis not present

## 2019-09-24 DIAGNOSIS — I350 Nonrheumatic aortic (valve) stenosis: Secondary | ICD-10-CM | POA: Diagnosis not present

## 2019-09-24 DIAGNOSIS — M800AXD Age-related osteoporosis with current pathological fracture, other site, subsequent encounter for fracture with routine healing: Secondary | ICD-10-CM | POA: Diagnosis not present

## 2019-09-24 DIAGNOSIS — F331 Major depressive disorder, recurrent, moderate: Secondary | ICD-10-CM | POA: Diagnosis not present

## 2019-09-24 DIAGNOSIS — I739 Peripheral vascular disease, unspecified: Secondary | ICD-10-CM | POA: Diagnosis not present

## 2019-09-24 NOTE — Telephone Encounter (Signed)
He states she does not need to be seen before surgery

## 2019-09-24 NOTE — Telephone Encounter (Signed)
Please advise 

## 2019-09-24 NOTE — Telephone Encounter (Signed)
I called daughter and advised.

## 2019-09-24 NOTE — Telephone Encounter (Signed)
Patient's daughter called.   The patient has sustained two falls and several hospital visits since last being seen here. She is also scheduled for surgery so they want to know if her upcoming appointment here is still  necessary.   Call back: [phone]

## 2019-09-24 NOTE — Telephone Encounter (Signed)
She does not need to be seen before surgery.

## 2019-09-25 DIAGNOSIS — K5903 Drug induced constipation: Secondary | ICD-10-CM | POA: Diagnosis not present

## 2019-09-25 DIAGNOSIS — I739 Peripheral vascular disease, unspecified: Secondary | ICD-10-CM | POA: Diagnosis not present

## 2019-09-25 DIAGNOSIS — I350 Nonrheumatic aortic (valve) stenosis: Secondary | ICD-10-CM | POA: Diagnosis not present

## 2019-09-25 DIAGNOSIS — S3282XD Multiple fractures of pelvis without disruption of pelvic ring, subsequent encounter for fracture with routine healing: Secondary | ICD-10-CM | POA: Diagnosis not present

## 2019-09-25 DIAGNOSIS — E440 Moderate protein-calorie malnutrition: Secondary | ICD-10-CM | POA: Diagnosis not present

## 2019-09-26 ENCOUNTER — Telehealth: Payer: Self-pay | Admitting: Orthopaedic Surgery

## 2019-09-26 ENCOUNTER — Encounter (HOSPITAL_COMMUNITY)
Admission: RE | Admit: 2019-09-26 | Discharge: 2019-09-26 | Disposition: A | Discharge status: Home / Self Care | Payer: Medicare Other | Source: Ambulatory Visit | Attending: Orthopaedic Surgery | Admitting: Orthopaedic Surgery

## 2019-09-26 ENCOUNTER — Other Ambulatory Visit: Payer: Self-pay

## 2019-09-26 ENCOUNTER — Encounter (HOSPITAL_COMMUNITY): Payer: Self-pay

## 2019-09-26 DIAGNOSIS — M87252 Osteonecrosis due to previous trauma, left femur: Secondary | ICD-10-CM | POA: Diagnosis not present

## 2019-09-26 DIAGNOSIS — Z79899 Other long term (current) drug therapy: Secondary | ICD-10-CM | POA: Insufficient documentation

## 2019-09-26 DIAGNOSIS — Z01812 Encounter for preprocedural laboratory examination: Secondary | ICD-10-CM | POA: Insufficient documentation

## 2019-09-26 DIAGNOSIS — I10 Essential (primary) hypertension: Secondary | ICD-10-CM | POA: Insufficient documentation

## 2019-09-26 HISTORY — DX: Unspecified cataract: H26.9

## 2019-09-26 HISTORY — DX: Presence of spectacles and contact lenses: Z97.3

## 2019-09-26 HISTORY — DX: Headache, unspecified: R51.9

## 2019-09-26 HISTORY — DX: Other injury of unspecified body region, initial encounter: T14.8XXA

## 2019-09-26 HISTORY — DX: Osteonecrosis, unspecified: M87.9

## 2019-09-26 HISTORY — DX: Thoracic aortic aneurysm, without rupture: I71.2

## 2019-09-26 HISTORY — DX: Aneurysm of the ascending aorta, without rupture: I71.21

## 2019-09-26 LAB — BASIC METABOLIC PANEL
Anion gap: 12 (ref 5–15)
BUN: 32 mg/dL — ABNORMAL HIGH (ref 8–23)
CO2: 23 mmol/L (ref 22–32)
Calcium: 9.7 mg/dL (ref 8.9–10.3)
Chloride: 103 mmol/L (ref 98–111)
Creatinine, Ser: 1.01 mg/dL — ABNORMAL HIGH (ref 0.44–1.00)
GFR calc Af Amer: 59 mL/min — ABNORMAL LOW (ref >60)
GFR calc non Af Amer: 51 mL/min — ABNORMAL LOW (ref >60)
Glucose, Bld: 98 mg/dL (ref 70–99)
Potassium: 4.7 mmol/L (ref 3.5–5.1)
Sodium: 138 mmol/L (ref 135–145)

## 2019-09-26 LAB — CBC
HCT: 40.7 % (ref 36.0–46.0)
Hemoglobin: 12.3 g/dL (ref 12.0–15.0)
MCH: 28 pg (ref 26.0–34.0)
MCHC: 30.2 g/dL (ref 30.0–36.0)
MCV: 92.7 fL (ref 80.0–100.0)
Platelets: 360 10*3/uL (ref 150–400)
RBC: 4.39 MIL/uL (ref 3.87–5.11)
RDW: 13.8 % (ref 11.5–15.5)
WBC: 10.8 10*3/uL — ABNORMAL HIGH (ref 4.0–10.5)
nRBC: 0 % (ref 0.0–0.2)

## 2019-09-26 LAB — SURGICAL PCR SCREEN
MRSA, PCR: NEGATIVE
Staphylococcus aureus: NEGATIVE

## 2019-09-26 MED ORDER — HYDROCODONE-ACETAMINOPHEN 5-325 MG PO TABS: 1.0000 | ORAL_TABLET | Freq: Four times a day (QID) | ORAL | 0 refills | Status: DC | PRN

## 2019-09-26 NOTE — Telephone Encounter (Signed)
Patient's daughter called. She says that patient needs more pain medication. She is taking hydrocodone. Her call back number is [phone]

## 2019-09-26 NOTE — Pre-Procedure Instructions (Signed)
   Michaela Simpson  09/26/2019     [facility] [phone] - [place], [place] AT [place] [id number] [name] [address] Phone: [phone] Fax: [phone]   Your procedure is scheduled on Tuesday, October 02, 2019  Report to [facility] Admitting at 1:00 P.M.  Call this number if you have problems the morning of surgery:  [phone]   Remember: Brush your teeth the morning of surgery.   Do not eat after midnight the night before surgery.   You may drink clear liquids until 12:00 noon the day of surgery .  Clear liquids allowed are:  Water, Juice (non-citric and without pulp), Carbonated beverages, Clear Tea, Black Coffee only and Gatorade. Please finish your Ensure Pre-Surgery drink by 12:00 noon the day of surgery (Do not sip).    Take these medicines the morning of surgery with A SIP OF WATER: sertraline (ZOLOFT)  If needed: Tylenol ( or)  Tramadol  (or ) Hydrocodone for pain  Follow the surgeon's instructions regarding Aspirin and clopidogrel (PLAVIX) . If no instructions were provided, please call the surgeon's office for pre-op  Instructions.   Stop taking vitamins, fish oil and herbal medications. Do not take any NSAIDs ie: Ibuprofen, Advil, Naproxen (Aleve), Motrin, BC and Goody Powder; stop now.    Do not wear jewelry, make-up or nail polish.  Do not wear lotions, powders, or perfumes, or deodorant.  Do not shave 48 hours prior to surgery.  Do not bring valuables to the hospital.  [facility] is not responsible for any belongings or valuables.  Contacts, dentures or bridgework may not be worn into surgery.  Leave your suitcase in the car.  After surgery it may be brought to your room.  For patients admitted to the hospital, discharge time will be determined by your treatment team.  Patients discharged the day of surgery will not be allowed to drive home.   Special instructions: See " [facility]  Preparing For Surgery" sheet. ( CHG wash).  Please read over the following fact sheets that you were given. Pain Booklet, Coughing and Deep Breathing, Total Joint Packet, MRSA Information and Surgical Site Infection Prevention

## 2019-09-26 NOTE — Telephone Encounter (Signed)
Please advise 

## 2019-09-26 NOTE — Telephone Encounter (Signed)
I'll send some in. 

## 2019-09-26 NOTE — Progress Notes (Signed)
Pt denies SOB and chest pain. Pt is under the care of Dr. Nevada Crane, PCP and Dr. Burt Knack, Cardiology. Pt denies having a stress test. Pt daughter Molli Posey stated,  " her last dose of Plavix was Monday and the cardiologist and surgeon said for her to keep taking the Aspirin. " Pt reminded to quarantine. Pt verbalized understanding of all pre-op instructions. Pt chart forwarded to PA,  Anesthesiology, for review.

## 2019-09-26 NOTE — Pre-Procedure Instructions (Addendum)
   Michaela Simpson  09/26/2019     [facility] [phone] - [place], [place] AT [place] [id number] [name] [address] Phone: [phone] Fax: [phone]   Your procedure is scheduled on Tuesday, October 02, 2019  Report to [facility] Admitting at 1:00 P.M.  Call this number if you have problems the morning of surgery:  [phone]   Remember: Brush your teeth the morning of surgery.   Do not eat after midnight the night before surgery.   You may drink clear liquids until 12:00 noon the day of surgery .  Clear liquids allowed are:  Water, Juice (non-citric and without pulp), Carbonated beverages, Clear Tea, Black Coffee only and Gatorade. Please finish your Ensure Pre-Surgery drink by 12:00 noon the day of surgery (Do not sip).    Take these medicines the morning of surgery with A SIP OF WATER: sertraline (ZOLOFT)  If needed: Tylenol ( or)  Tramadol  (or ) Hydrocodone for pain  Follow the surgeon's instructions regarding Aspirin and clopidogrel (PLAVIX) . If no instructions were provided, please call the surgeon's office for pre-op  Instructions.   Stop taking Aspirin (unless otherwise advised by surgeon), vitamins, fish oil and herbal medications. Do not take any NSAIDs ie: Ibuprofen, Advil, Naproxen (Aleve), Motrin, BC and Goody Powder; stop now.    Do not wear jewelry, make-up or nail polish.  Do not wear lotions, powders, or perfumes, or deodorant.  Do not shave 48 hours prior to surgery.  Do not bring valuables to the hospital.  [facility] is not responsible for any belongings or valuables.  Contacts, dentures or bridgework may not be worn into surgery.  Leave your suitcase in the car.  After surgery it may be brought to your room.  For patients admitted to the hospital, discharge time will be determined by your treatment team.  Patients discharged the day of surgery will not be allowed to drive home.    Special instructions: See " [facility] Preparing For Surgery" sheet. ( CHG wash).  Please read over the following fact sheets that you were given. Pain Booklet, Coughing and Deep Breathing, Total Joint Packet, MRSA Information and Surgical Site Infection Prevention

## 2019-09-27 ENCOUNTER — Encounter (HOSPITAL_COMMUNITY): Payer: Self-pay

## 2019-09-27 DIAGNOSIS — M80052D Age-related osteoporosis with current pathological fracture, left femur, subsequent encounter for fracture with routine healing: Secondary | ICD-10-CM | POA: Diagnosis not present

## 2019-09-27 DIAGNOSIS — F329 Major depressive disorder, single episode, unspecified: Secondary | ICD-10-CM | POA: Diagnosis not present

## 2019-09-27 DIAGNOSIS — I10 Essential (primary) hypertension: Secondary | ICD-10-CM | POA: Diagnosis not present

## 2019-09-27 DIAGNOSIS — M800AXD Age-related osteoporosis with current pathological fracture, other site, subsequent encounter for fracture with routine healing: Secondary | ICD-10-CM | POA: Diagnosis not present

## 2019-09-27 DIAGNOSIS — M069 Rheumatoid arthritis, unspecified: Secondary | ICD-10-CM | POA: Diagnosis not present

## 2019-09-27 DIAGNOSIS — I739 Peripheral vascular disease, unspecified: Secondary | ICD-10-CM | POA: Diagnosis not present

## 2019-09-27 NOTE — Progress Notes (Signed)
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  History includes never smoker, severe AS (s/p TAVR 08/07/19), PAD/RLE ulcer (s/p mechanical thrombectomy of right SFA 05/08/19), HTN, AKI (02/2019), anemia, RA, right hip fracture (s/p ORIF using nail and screw 03/14/15), left hip fracture (s/p cannulated screw placement 03/09/19). - Hospitalized 09/19/19-09/21/19 for fall. Head CT which was negative. Orthopedic imaging showed chronic fracture deformity of the proximal left humerus, old left rib fractures, left clavicular fracture, and minimally displaced acute fractures of the left sacral ala, anterior left acetabulum, and left inferior pubic ramus. Orthopedics was consulted in ED with "nonsurgical" management recommended with WBAT and a sling to LUE. She was admitted for pain control and PT/OT evaluations.  Patient evaluated by Nell Range, PA-C on 09/13/19 for TAVR follow-up. Follow-up echo showed "EF 60% with normally functioning TAVR with a mean gradient of 9 mm Hg and trivial AI." Continue DAPT with ASA and Plavix with plans to stop Plavix after 6 months of therapy. She has clindamycin for SBE prophylaxis. Continued medical therapy for 1V CAD recommended. 3 month chest CT recommended for follow-up non-specific pulmonary opacity near left 8th rib favored to be post-traumatic scarring on 07/26/19 CTA (since then had chest CT 09/19/19 that showed unchanged bandlike scarring or ATX LLL). Also recommended annual follow-up for 4.4 cm ascending TAA that  was noted on 07/26/19 CTA. In regards to upcoming hip surgery, "She is cleared from a cardiac standpoint to proceed with hip surgery with interruption of her dual antiplatelet therapy."  - Per patient's daughter Molli Posey, "last dose of Plavix was Monday [09/24/19] and the cardiologist and surgeon said for her to keep taking the Aspirin."  She is scheduled for presurgical COVID-19 test on 09/28/19. Anesthesia team to evaluate on the day of surgery.    VS: BP 123/79   Pulse 95   Temp 36.9 C (Oral)   Resp 18   Ht 5' (1.524 m)   Wt 47.2 kg   SpO2 100%   BMI 20.31 kg/m     PROVIDERS: Celene Squibb, MD is PCP  Sherren Mocha, MD is cardiologist   LABS: Labs reviewed: Acceptable for surgery.  Lab Results  Component Value Date   WBC 10.8 (H) 09/26/2019   HGB 12.3 09/26/2019   HCT 40.7 09/26/2019   PLT 360 09/26/2019   GLUCOSE 98 09/26/2019   ALT 33 09/19/2019   AST 36 09/19/2019   NA 138 09/26/2019   K 4.7 09/26/2019   CL 103 09/26/2019   CREATININE 1.01 (H) 09/26/2019   BUN 32 (H) 09/26/2019   CO2 23 09/26/2019   INR 1.0 08/03/2019   HGBA1C 5.2 08/03/2019     IMAGES: CT Chest 09/19/19: IMPRESSION: 1. No acute appearing rib fractures. Multiple chronic rib fracture deformities, unchanged compared to CT dated 07/26/2019. 2.  Unchanged vertebral endplate deformities of T12, L1, and L2. 3.  Chronic fracture deformity of the proximal left humerus. 4. Interval placement of aortic valve stent endograft. Aortic  Atherosclerosis (ICD10-I70.0). 5.  Coronary artery disease. 6.  Trace left pleural effusion.   EKG: 09/19/19: Sinus rhythm Borderline left axis deviation RSR' in V1 or V2, probably normal variant Borderline T abnormalities, anterior leads no significant change since Feb 2021 Confirmed by Sherwood Gambler [phone]) on 09/19/2019 12:43:53 PM   CV: Echo 09/13/19: IMPRESSIONS  1. Left ventricular ejection fraction, by estimation, is 60 to 65%. The  left ventricle has normal  function. The left ventricle has no regional  wall motion abnormalities. Left ventricular diastolic parameters are  consistent with Grade I diastolic  dysfunction (impaired relaxation). Elevated left ventricular end-diastolic  pressure.  2. Right ventricular systolic function is normal. The right ventricular  size is normal. Tricuspid regurgitation signal is inadequate for assessing  PA pressure.  3. Left atrial size was moderately dilated.  4. The mitral valve is degenerative. Mild mitral annular calcification.  No evidence of mitral valve regurgitation. No evidence of mitral stenosis.  5. The aortic valve has been repaired/replaced. There is a 23 mm Edwards  Sapien prosthetic, stented (TAVR) valve present in the aortic position.  Aortic valve mean gradient measures 9.0 mmHg. Aortic valve peak gradient  measures 16.3 mmHg. Aortic valve  area, by VTI measures 1.99 cm. DI is 0.57. There is trivial perivalvular  AI.  6. Aortic dilatation noted. There is mild dilatation of the ascending  aorta and of the aortic root measuring 45 mm and 4mm respectively.  7. The inferior vena cava is normal in size with greater than 50%  respiratory variability, suggesting right atrial pressure of 3 mmHg.  8. Compared to prior echo 08/08/2019, the transvalvular aortic peak/mean  gradients have improved from 23.6/13 to 16.3/37mmHg on current study. The  ascending aortic measurement of 79mm is similar to findings on Chest CTA  07/2019 which measured 87mm.    Carotid US 07/26/19: Summary:  - Right Carotid: Velocities in the right ICA are consistent with a 1-39%  stenosis.  - Left Carotid: Velocities in the left ICA are consistent with a 1-39%  stenosis.  - Vertebrals: Bilateral vertebral arteries demonstrate antegrade flow.    Cardiac cath 07/19/19: 1. Severe single vessel CAD involving a moderate caliber (2 mm) first diagonal branch (80% D1) of the LAD, otherwise widely patent left main, LAD, LCx,  and RCA 2. Severe calcific AS with mean gradient 50 mmHg and AVA 0.56 square cm 3. Normal right heart pressures Plan: continue TAVR evaluation. Medical therapy for CAD (branch vessel disease, pt without angina)   Past Medical History:  Diagnosis Date  . AKI (acute kidney injury) ([place])   . Anemia   . Aortic stenosis   . Bruising    large bruise to left neck and T/O body  . Depression   . Early cataract   . Headache   . Hip fracture ([place])   . HTN (hypertension)   . Humerus fracture   . Leukocytosis   . Osteonecrosis ([place])    post-traumatic osteonecrosis of the femoral head of the left hip, retained orthopedic hardware ( order for conset)  . Osteoporosis   . Peripheral vascular disease ([place])    blood clot right leg  . Rheumatoid arthritis ([place])   . Thoracic ascending aortic aneurysm ([facility])    4.4 cm by 07/26/19 CTA  . Ulcer of lower extremity ([place])   . Wears glasses     Past Surgical History:  Procedure Laterality Date  . ABDOMINAL AORTOGRAM W/LOWER EXTREMITY N/A 05/08/2019   Procedure: ABDOMINAL AORTOGRAM W/LOWER  EXTREMITY;  Surgeon: Serafina Mitchell, MD;  Location: [facility];  Service: Cardiovascular;  Laterality: N/A;  . HIP PINNING,CANNULATED Left 03/09/2019   Procedure: CANNULATED HIP PINNING;  Surgeon: Mcarthur Rossetti, MD;  Location: [place];  Service: Orthopedics;  Laterality: Left;  . INTRAMEDULLARY (IM) NAIL INTERTROCHANTERIC Right 03/14/2015   Procedure: RIGHT INTERTROCHANTRIC INTRAMEDULLARY (IM) NAIL ;  Surgeon: Mcarthur Rossetti, MD;  Location: [place];  Service: Orthopedics;  Laterality: Right;  . PERIPHERAL VASCULAR INTERVENTION Right 05/08/2019   Procedure: PERIPHERAL VASCULAR INTERVENTION;  Surgeon: Serafina Mitchell, MD;  Location: [facility] [facility];  Service: Cardiovascular;  Laterality: Right;  . RIGHT/LEFT HEART CATH AND CORONARY ANGIOGRAPHY N/A 07/19/2019   Procedure: RIGHT/LEFT HEART CATH AND CORONARY ANGIOGRAPHY;  Surgeon: Sherren Mocha,  MD;  Location: [place] [facility];  Service: Cardiovascular;  Laterality: N/A;  . TEE WITHOUT CARDIOVERSION N/A 08/07/2019   Procedure: TRANSESOPHAGEAL ECHOCARDIOGRAM (TEE);  Surgeon: Sherren Mocha, MD;  Location: [place] [facility];  Service: Open Heart Surgery;  Laterality: N/A;  . TRANSCATHETER AORTIC VALVE REPLACEMENT, TRANSFEMORAL  08/07/2019  . TRANSCATHETER AORTIC VALVE REPLACEMENT, TRANSFEMORAL N/A 08/07/2019   Procedure: TRANSCATHETER AORTIC VALVE REPLACEMENT, TRANSFEMORAL;  Surgeon: Sherren Mocha, MD;  Location: [place];  Service: Open Heart Surgery;  Laterality: N/A;    MEDICATIONS: . acetaminophen (TYLENOL) 325 MG tablet  . amLODipine (NORVASC) 2.5 MG tablet  . aspirin EC 81 MG tablet  . [place]-Glucosamine-Vit D (OSTEO BI-FLEX ONE PER DAY PO)  . Cholecalciferol (VITAMIN D-3) 125 MCG (5000 UT) TABS  . clindamycin (CLEOCIN) 300 MG capsule  . clopidogrel (PLAVIX) 75 MG tablet  . HYDROcodone-acetaminophen (NORCO/VICODIN) 5-325 MG tablet  . Lactobacillus (PROBIOTIC ACIDOPHILUS PO)  . lactose free nutrition (BOOST) LIQD  . LORazepam (ATIVAN) 0.5 MG tablet  . Magnesium 400 MG CAPS  . Menatetrenone (VITAMIN K2) 100 MCG TABS  . Multiple Vitamins-Minerals (MULTIVITAMIN WITH MINERALS) tablet  . Polyethylene Glycol 3350 (MIRALAX PO)  . rosuvastatin (CRESTOR) 10 MG tablet  . sertraline (ZOLOFT) 100 MG tablet  . traMADol (ULTRAM) 50 MG tablet  . vitamin B-12 (CYANOCOBALAMIN) 1000 MCG tablet   No current facility-administered medications for this encounter.     Myra Gianotti, PA-C Surgical Short Stay/Anesthesiology [facility] Phone [phone] [facility] Phone [phone] 09/27/2019 5:39 PM

## 2019-09-27 NOTE — Telephone Encounter (Signed)
IC s/w patient and advised  

## 2019-09-27 NOTE — Anesthesia Preprocedure Evaluation (Addendum)
Anesthesia Evaluation  Patient identified by MRN, date of birth, ID band Patient awake    Reviewed: Allergy & Precautions, NPO status , Patient's Chart, lab work & pertinent test results  Airway Mallampati: II  TM Distance: >3 FB Neck ROM: Full    Dental no notable dental hx.    Pulmonary neg pulmonary ROS,    Pulmonary exam normal breath sounds clear to auscultation       Cardiovascular hypertension, + Peripheral Vascular Disease  + Valvular Problems/Murmurs AS  Rhythm:Regular Rate:Normal + Systolic murmurs S/P TAVR   Neuro/Psych negative neurological ROS  negative psych ROS   GI/Hepatic negative GI ROS, Neg liver ROS,   Endo/Other  negative endocrine ROS  Renal/GU negative Renal ROS  negative genitourinary   Musculoskeletal  (+) Arthritis , Rheumatoid disorders,    Abdominal   Peds negative pediatric ROS (+)  Hematology negative hematology ROS (+)   Anesthesia Other Findings   Reproductive/Obstetrics negative OB ROS                           Anesthesia Physical Anesthesia Plan  ASA: III  Anesthesia Plan: General   Post-op Pain Management:    Induction: Intravenous  PONV Risk Score and Plan: 3 and Ondansetron, Dexamethasone and Treatment may vary due to age or medical condition  Airway Management Planned: Oral ETT  Additional Equipment:   Intra-op Plan:   Post-operative Plan: Extubation in OR  Informed Consent: I have reviewed the patients History and Physical, chart, labs and discussed the procedure including the risks, benefits and alternatives for the proposed anesthesia with the patient or authorized representative who has indicated his/her understanding and acceptance.     Dental advisory given  Plan Discussed with: CRNA and Surgeon  Anesthesia Plan Comments: (PAT note written 09/27/2019 by Myra Gianotti, PA-C. )       Anesthesia Quick Evaluation

## 2019-09-28 ENCOUNTER — Other Ambulatory Visit: Payer: Self-pay

## 2019-09-28 ENCOUNTER — Other Ambulatory Visit (HOSPITAL_COMMUNITY)
Admission: RE | Admit: 2019-09-28 | Discharge: 2019-09-28 | Disposition: A | Discharge status: Home / Self Care | Payer: Medicare Other | Source: Ambulatory Visit | Attending: Orthopaedic Surgery | Admitting: Orthopaedic Surgery

## 2019-09-28 DIAGNOSIS — F329 Major depressive disorder, single episode, unspecified: Secondary | ICD-10-CM | POA: Diagnosis not present

## 2019-09-28 DIAGNOSIS — M800AXD Age-related osteoporosis with current pathological fracture, other site, subsequent encounter for fracture with routine healing: Secondary | ICD-10-CM | POA: Diagnosis not present

## 2019-09-28 DIAGNOSIS — M069 Rheumatoid arthritis, unspecified: Secondary | ICD-10-CM | POA: Diagnosis not present

## 2019-09-28 DIAGNOSIS — Z01812 Encounter for preprocedural laboratory examination: Secondary | ICD-10-CM | POA: Diagnosis not present

## 2019-09-28 DIAGNOSIS — Z20822 Contact with and (suspected) exposure to covid-19: Secondary | ICD-10-CM | POA: Insufficient documentation

## 2019-09-28 DIAGNOSIS — I739 Peripheral vascular disease, unspecified: Secondary | ICD-10-CM | POA: Diagnosis not present

## 2019-09-28 DIAGNOSIS — M80052D Age-related osteoporosis with current pathological fracture, left femur, subsequent encounter for fracture with routine healing: Secondary | ICD-10-CM | POA: Diagnosis not present

## 2019-09-28 DIAGNOSIS — I10 Essential (primary) hypertension: Secondary | ICD-10-CM | POA: Diagnosis not present

## 2019-09-29 LAB — SARS CORONAVIRUS 2 (TAT 6-24 HRS): SARS Coronavirus 2: NEGATIVE

## 2019-10-01 ENCOUNTER — Ambulatory Visit: Payer: Medicare Other | Admitting: Orthopaedic Surgery

## 2019-10-01 ENCOUNTER — Telehealth: Payer: Self-pay | Admitting: *Deleted

## 2019-10-01 DIAGNOSIS — I739 Peripheral vascular disease, unspecified: Secondary | ICD-10-CM | POA: Diagnosis not present

## 2019-10-01 DIAGNOSIS — M80052D Age-related osteoporosis with current pathological fracture, left femur, subsequent encounter for fracture with routine healing: Secondary | ICD-10-CM | POA: Diagnosis not present

## 2019-10-01 DIAGNOSIS — F329 Major depressive disorder, single episode, unspecified: Secondary | ICD-10-CM | POA: Diagnosis not present

## 2019-10-01 DIAGNOSIS — M069 Rheumatoid arthritis, unspecified: Secondary | ICD-10-CM | POA: Diagnosis not present

## 2019-10-01 DIAGNOSIS — I10 Essential (primary) hypertension: Secondary | ICD-10-CM | POA: Diagnosis not present

## 2019-10-01 DIAGNOSIS — M800AXD Age-related osteoporosis with current pathological fracture, other site, subsequent encounter for fracture with routine healing: Secondary | ICD-10-CM | POA: Diagnosis not present

## 2019-10-01 NOTE — Telephone Encounter (Signed)
Ortho bundle pre-op call completed. 

## 2019-10-01 NOTE — Care Plan (Signed)
RNCM pre-op call to patient to discuss her upcoming screw removal with Left THA per Dr. Ninfa Linden on 10/02/19. Reviewed Ortho bundle program and patient requested that CM reach out to her daughters Michaela Simpson and Michaela Simpson to review all information related to her surgery. Patient does have paid sitters/CG. She does have a FWW as well as BSC/3in1 at home. Reviewed all information with Michaela Simpson earlier in the day pertaining to post-op care of a left THA. Michaela Simpson did discuss if her mother going to a SNF for rehab after surgery would be a better option. Informed that goal is to go home if there are caregivers, but did explain that CM would discuss with MD and monitor patient's status while in the hospital. Later in the day, spoke with Michaela Simpson, patient's other daughter, who was very concerned regarding the recent falls with subsequent collar bone fracture as well as pelvic fracture. CM reviewed that the "best case" is for her mother to go home with caregivers and home health PT, but is aware that there may be concerns regarding safety and this being the best thing for her. Will discuss with Dr. Ninfa Linden and follow progress after surgery to ensure the best outcome for her mother. Patient is currently active with [facility] for PT now. Referral sent to liaison- Michaela Simpson. Will monitor for all needs.

## 2019-10-02 ENCOUNTER — Other Ambulatory Visit: Payer: Self-pay

## 2019-10-02 ENCOUNTER — Inpatient Hospital Stay (HOSPITAL_COMMUNITY): Payer: Medicare Other | Admitting: Vascular Surgery

## 2019-10-02 ENCOUNTER — Inpatient Hospital Stay (HOSPITAL_COMMUNITY): Payer: Medicare Other | Admitting: Anesthesiology

## 2019-10-02 ENCOUNTER — Encounter (HOSPITAL_COMMUNITY): Payer: Self-pay | Admitting: Orthopaedic Surgery

## 2019-10-02 ENCOUNTER — Encounter (HOSPITAL_COMMUNITY)
Admission: RE | Disposition: A | Discharge status: Home Health | Payer: Self-pay | Source: Home / Self Care | Attending: Orthopaedic Surgery

## 2019-10-02 ENCOUNTER — Inpatient Hospital Stay (HOSPITAL_COMMUNITY): Payer: Medicare Other

## 2019-10-02 ENCOUNTER — Inpatient Hospital Stay (HOSPITAL_COMMUNITY)
Admission: RE | Admit: 2019-10-02 | Discharge: 2019-10-07 | DRG: 469 | Disposition: A | Discharge status: Home Health | Payer: Medicare Other | Attending: Orthopaedic Surgery | Admitting: Orthopaedic Surgery

## 2019-10-02 DIAGNOSIS — M87252 Osteonecrosis due to previous trauma, left femur: Secondary | ICD-10-CM | POA: Diagnosis present

## 2019-10-02 DIAGNOSIS — Z952 Presence of prosthetic heart valve: Secondary | ICD-10-CM

## 2019-10-02 DIAGNOSIS — E43 Unspecified severe protein-calorie malnutrition: Secondary | ICD-10-CM | POA: Diagnosis not present

## 2019-10-02 DIAGNOSIS — M1652 Unilateral post-traumatic osteoarthritis, left hip: Secondary | ICD-10-CM | POA: Diagnosis present

## 2019-10-02 DIAGNOSIS — I10 Essential (primary) hypertension: Secondary | ICD-10-CM | POA: Diagnosis present

## 2019-10-02 DIAGNOSIS — Z6820 Body mass index (BMI) 20.0-20.9, adult: Secondary | ICD-10-CM | POA: Diagnosis not present

## 2019-10-02 DIAGNOSIS — M12552 Traumatic arthropathy, left hip: Secondary | ICD-10-CM

## 2019-10-02 DIAGNOSIS — Z20822 Contact with and (suspected) exposure to covid-19: Secondary | ICD-10-CM | POA: Diagnosis present

## 2019-10-02 DIAGNOSIS — Z96642 Presence of left artificial hip joint: Secondary | ICD-10-CM | POA: Diagnosis not present

## 2019-10-02 DIAGNOSIS — I77810 Thoracic aortic ectasia: Secondary | ICD-10-CM | POA: Diagnosis not present

## 2019-10-02 DIAGNOSIS — Z9181 History of falling: Secondary | ICD-10-CM | POA: Diagnosis not present

## 2019-10-02 DIAGNOSIS — I739 Peripheral vascular disease, unspecified: Secondary | ICD-10-CM | POA: Diagnosis not present

## 2019-10-02 DIAGNOSIS — D62 Acute posthemorrhagic anemia: Secondary | ICD-10-CM | POA: Diagnosis not present

## 2019-10-02 DIAGNOSIS — F339 Major depressive disorder, recurrent, unspecified: Secondary | ICD-10-CM | POA: Diagnosis present

## 2019-10-02 DIAGNOSIS — Z885 Allergy status to narcotic agent status: Secondary | ICD-10-CM

## 2019-10-02 DIAGNOSIS — Z471 Aftercare following joint replacement surgery: Secondary | ICD-10-CM | POA: Diagnosis not present

## 2019-10-02 DIAGNOSIS — M810 Age-related osteoporosis without current pathological fracture: Secondary | ICD-10-CM | POA: Diagnosis not present

## 2019-10-02 DIAGNOSIS — S72002D Fracture of unspecified part of neck of left femur, subsequent encounter for closed fracture with routine healing: Secondary | ICD-10-CM | POA: Diagnosis not present

## 2019-10-02 DIAGNOSIS — Z880 Allergy status to penicillin: Secondary | ICD-10-CM

## 2019-10-02 DIAGNOSIS — M069 Rheumatoid arthritis, unspecified: Secondary | ICD-10-CM | POA: Diagnosis present

## 2019-10-02 DIAGNOSIS — Z419 Encounter for procedure for purposes other than remedying health state, unspecified: Principal | ICD-10-CM

## 2019-10-02 HISTORY — PX: TOTAL HIP ARTHROPLASTY: SHX124

## 2019-10-02 HISTORY — PX: HARDWARE REMOVAL: SHX979

## 2019-10-02 HISTORY — DX: Traumatic arthropathy, left hip: M12.552

## 2019-10-02 SURGERY — ARTHROPLASTY, HIP, TOTAL, ANTERIOR APPROACH
Anesthesia: General | Site: Hip | Laterality: Left

## 2019-10-02 MED ORDER — PROPOFOL 10 MG/ML IV BOLUS
INTRAVENOUS | Status: DC | PRN
  Administered 2019-10-02: 70 mg via INTRAVENOUS

## 2019-10-02 MED ORDER — SODIUM CHLORIDE 0.9 % IV SOLN: INTRAVENOUS | Status: DC

## 2019-10-02 MED ORDER — PROMETHAZINE HCL 25 MG/ML IJ SOLN: 6.2500 mg | INTRAMUSCULAR | Status: DC | PRN

## 2019-10-02 MED ORDER — PANTOPRAZOLE SODIUM 40 MG PO TBEC
40.0000 mg | DELAYED_RELEASE_TABLET | Freq: Every day | ORAL | Status: DC
  Administered 2019-10-03 – 2019-10-07 (×5): 40 mg via ORAL
  Filled 2019-10-02 (×5): qty 1

## 2019-10-02 MED ORDER — PHENYLEPHRINE HCL (PRESSORS) 10 MG/ML IV SOLN
INTRAVENOUS | Status: DC | PRN
  Administered 2019-10-02 (×2): 80 ug via INTRAVENOUS
  Administered 2019-10-02: 40 ug via INTRAVENOUS

## 2019-10-02 MED ORDER — ROCURONIUM BROMIDE 10 MG/ML (PF) SYRINGE
PREFILLED_SYRINGE | INTRAVENOUS | Status: DC | PRN
  Administered 2019-10-02: 50 mg via INTRAVENOUS
  Administered 2019-10-02: 10 mg via INTRAVENOUS

## 2019-10-02 MED ORDER — ACETAMINOPHEN 325 MG PO TABS
325.0000 mg | ORAL_TABLET | Freq: Four times a day (QID) | ORAL | Status: DC | PRN
  Administered 2019-10-03 – 2019-10-07 (×3): 650 mg via ORAL
  Filled 2019-10-02 (×4): qty 2

## 2019-10-02 MED ORDER — CLOPIDOGREL BISULFATE 75 MG PO TABS
75.0000 mg | ORAL_TABLET | Freq: Every day | ORAL | Status: DC
  Administered 2019-10-03 – 2019-10-07 (×5): 75 mg via ORAL
  Filled 2019-10-02 (×5): qty 1

## 2019-10-02 MED ORDER — STERILE WATER FOR IRRIGATION IR SOLN
Status: DC | PRN
  Administered 2019-10-02: 1000 mL

## 2019-10-02 MED ORDER — ONDANSETRON HCL 4 MG/2ML IJ SOLN
INTRAMUSCULAR | Status: DC | PRN
  Administered 2019-10-02: 4 mg via INTRAVENOUS

## 2019-10-02 MED ORDER — AMLODIPINE BESYLATE 2.5 MG PO TABS
2.5000 mg | ORAL_TABLET | Freq: Every day | ORAL | Status: DC
  Administered 2019-10-02 – 2019-10-06 (×5): 2.5 mg via ORAL
  Filled 2019-10-02 (×5): qty 1

## 2019-10-02 MED ORDER — ENSURE ENLIVE PO LIQD
237.0000 mL | Freq: Three times a day (TID) | ORAL | Status: DC
  Administered 2019-10-02 – 2019-10-07 (×13): 237 mL via ORAL

## 2019-10-02 MED ORDER — MAGNESIUM OXIDE 400 (241.3 MG) MG PO TABS
400.0000 mg | ORAL_TABLET | Freq: Every day | ORAL | Status: DC
  Administered 2019-10-03 – 2019-10-07 (×5): 400 mg via ORAL
  Filled 2019-10-02 (×5): qty 1

## 2019-10-02 MED ORDER — SERTRALINE HCL 50 MG PO TABS
150.0000 mg | ORAL_TABLET | Freq: Every day | ORAL | Status: DC
  Administered 2019-10-03 – 2019-10-07 (×5): 150 mg via ORAL
  Filled 2019-10-02 (×5): qty 1

## 2019-10-02 MED ORDER — METOCLOPRAMIDE HCL 5 MG/ML IJ SOLN: 5.0000 mg | Freq: Three times a day (TID) | INTRAMUSCULAR | Status: DC | PRN

## 2019-10-02 MED ORDER — FENTANYL CITRATE (PF) 250 MCG/5ML IJ SOLN
INTRAMUSCULAR | Status: AC
  Filled 2019-10-02: qty 5

## 2019-10-02 MED ORDER — CLINDAMYCIN PHOSPHATE 900 MG/50ML IV SOLN
900.0000 mg | INTRAVENOUS | Status: AC
  Administered 2019-10-02: 900 mg via INTRAVENOUS

## 2019-10-02 MED ORDER — LIDOCAINE 2% (20 MG/ML) 5 ML SYRINGE
INTRAMUSCULAR | Status: AC
  Filled 2019-10-02: qty 10

## 2019-10-02 MED ORDER — WHITE PETROLATUM EX OINT
TOPICAL_OINTMENT | CUTANEOUS | Status: AC
  Administered 2019-10-02: 0.2
  Filled 2019-10-02: qty 28.35

## 2019-10-02 MED ORDER — ONDANSETRON HCL 4 MG PO TABS: 4.0000 mg | ORAL_TABLET | Freq: Four times a day (QID) | ORAL | Status: DC | PRN

## 2019-10-02 MED ORDER — VITAMIN D 25 MCG (1000 UNIT) PO TABS
1000.0000 [IU] | ORAL_TABLET | Freq: Every day | ORAL | Status: DC
  Administered 2019-10-03 – 2019-10-07 (×5): 1000 [IU] via ORAL
  Filled 2019-10-02 (×5): qty 1

## 2019-10-02 MED ORDER — ROSUVASTATIN CALCIUM 5 MG PO TABS
10.0000 mg | ORAL_TABLET | Freq: Every day | ORAL | Status: DC
  Administered 2019-10-02 – 2019-10-06 (×5): 10 mg via ORAL
  Filled 2019-10-02 (×5): qty 2

## 2019-10-02 MED ORDER — LACTATED RINGERS IV SOLN: INTRAVENOUS | Status: DC

## 2019-10-02 MED ORDER — FENTANYL CITRATE (PF) 100 MCG/2ML IJ SOLN
INTRAMUSCULAR | Status: AC
  Filled 2019-10-02: qty 2

## 2019-10-02 MED ORDER — POVIDONE-IODINE 10 % EX SWAB: 2.0000 "application " | Freq: Once | CUTANEOUS | Status: DC

## 2019-10-02 MED ORDER — METHOCARBAMOL 500 MG PO TABS
500.0000 mg | ORAL_TABLET | Freq: Four times a day (QID) | ORAL | Status: DC | PRN
  Administered 2019-10-02 – 2019-10-05 (×7): 500 mg via ORAL
  Filled 2019-10-02 (×7): qty 1

## 2019-10-02 MED ORDER — ACETAMINOPHEN 10 MG/ML IV SOLN
710.0000 mg | Freq: Once | INTRAVENOUS | Status: DC | PRN
  Administered 2019-10-02: 710 mg via INTRAVENOUS
  Filled 2019-10-02 (×2): qty 71

## 2019-10-02 MED ORDER — POLYETHYLENE GLYCOL 3350 17 G PO PACK
17.0000 g | PACK | Freq: Every day | ORAL | Status: DC | PRN
  Administered 2019-10-04: 17 g via ORAL
  Filled 2019-10-02: qty 1

## 2019-10-02 MED ORDER — VITAMIN B-12 1000 MCG PO TABS
1000.0000 ug | ORAL_TABLET | Freq: Every day | ORAL | Status: DC
  Administered 2019-10-03 – 2019-10-07 (×5): 1000 ug via ORAL
  Filled 2019-10-02 (×5): qty 1

## 2019-10-02 MED ORDER — VITAMIN K2 100 MCG PO TABS: 100.0000 ug | ORAL_TABLET | Freq: Every day | ORAL | Status: DC

## 2019-10-02 MED ORDER — SODIUM CHLORIDE 0.9 % IR SOLN
Status: DC | PRN
  Administered 2019-10-02: 3000 mL

## 2019-10-02 MED ORDER — ONDANSETRON HCL 4 MG/2ML IJ SOLN
INTRAMUSCULAR | Status: AC
  Filled 2019-10-02: qty 6

## 2019-10-02 MED ORDER — HYDROMORPHONE HCL 1 MG/ML IJ SOLN
0.2500 mg | INTRAMUSCULAR | Status: DC | PRN
  Administered 2019-10-02: 0.5 mg via INTRAVENOUS
  Administered 2019-10-02: 0.25 mg via INTRAVENOUS

## 2019-10-02 MED ORDER — FENTANYL CITRATE (PF) 100 MCG/2ML IJ SOLN
INTRAMUSCULAR | Status: DC | PRN
  Administered 2019-10-02 (×5): 50 ug via INTRAVENOUS

## 2019-10-02 MED ORDER — CLINDAMYCIN PHOSPHATE 900 MG/50ML IV SOLN
INTRAVENOUS | Status: AC
  Filled 2019-10-02: qty 50

## 2019-10-02 MED ORDER — METOCLOPRAMIDE HCL 5 MG PO TABS: 5.0000 mg | ORAL_TABLET | Freq: Three times a day (TID) | ORAL | Status: DC | PRN

## 2019-10-02 MED ORDER — PHENYLEPHRINE HCL-NACL 10-0.9 MG/250ML-% IV SOLN
INTRAVENOUS | Status: DC | PRN
  Administered 2019-10-02: 40 ug/min via INTRAVENOUS

## 2019-10-02 MED ORDER — CLINDAMYCIN PHOSPHATE 600 MG/50ML IV SOLN
600.0000 mg | Freq: Four times a day (QID) | INTRAVENOUS | Status: AC
  Administered 2019-10-02 – 2019-10-03 (×2): 600 mg via INTRAVENOUS
  Filled 2019-10-02 (×2): qty 50

## 2019-10-02 MED ORDER — ACETAMINOPHEN 10 MG/ML IV SOLN
INTRAVENOUS | Status: AC
  Filled 2019-10-02: qty 100

## 2019-10-02 MED ORDER — ONDANSETRON HCL 4 MG/2ML IJ SOLN
4.0000 mg | Freq: Four times a day (QID) | INTRAMUSCULAR | Status: DC | PRN
  Administered 2019-10-05 – 2019-10-06 (×3): 4 mg via INTRAVENOUS
  Filled 2019-10-02 (×4): qty 2

## 2019-10-02 MED ORDER — SUGAMMADEX SODIUM 200 MG/2ML IV SOLN
INTRAVENOUS | Status: DC | PRN
  Administered 2019-10-02: 150 mg via INTRAVENOUS

## 2019-10-02 MED ORDER — DIPHENHYDRAMINE HCL 12.5 MG/5ML PO ELIX: 12.5000 mg | ORAL_SOLUTION | ORAL | Status: DC | PRN

## 2019-10-02 MED ORDER — METHOCARBAMOL 1000 MG/10ML IJ SOLN
500.0000 mg | Freq: Four times a day (QID) | INTRAVENOUS | Status: DC | PRN
  Filled 2019-10-02: qty 5

## 2019-10-02 MED ORDER — MENTHOL 3 MG MT LOZG: 1.0000 | LOZENGE | OROMUCOSAL | Status: DC | PRN

## 2019-10-02 MED ORDER — DEXAMETHASONE SODIUM PHOSPHATE 4 MG/ML IJ SOLN
INTRAMUSCULAR | Status: DC | PRN
  Administered 2019-10-02: 5 mg via INTRAVENOUS

## 2019-10-02 MED ORDER — PHENYLEPHRINE 40 MCG/ML (10ML) SYRINGE FOR IV PUSH (FOR BLOOD PRESSURE SUPPORT)
PREFILLED_SYRINGE | INTRAVENOUS | Status: AC
  Filled 2019-10-02: qty 10

## 2019-10-02 MED ORDER — SUCCINYLCHOLINE CHLORIDE 200 MG/10ML IV SOSY
PREFILLED_SYRINGE | INTRAVENOUS | Status: AC
  Filled 2019-10-02: qty 10

## 2019-10-02 MED ORDER — DOCUSATE SODIUM 100 MG PO CAPS
100.0000 mg | ORAL_CAPSULE | Freq: Two times a day (BID) | ORAL | Status: DC
  Administered 2019-10-02 – 2019-10-07 (×10): 100 mg via ORAL
  Filled 2019-10-02 (×10): qty 1

## 2019-10-02 MED ORDER — PHENOL 1.4 % MT LIQD: 1.0000 | OROMUCOSAL | Status: DC | PRN

## 2019-10-02 MED ORDER — HYDROCODONE-ACETAMINOPHEN 5-325 MG PO TABS
1.0000 | ORAL_TABLET | ORAL | Status: DC | PRN
  Administered 2019-10-03 – 2019-10-04 (×7): 2 via ORAL
  Administered 2019-10-05 – 2019-10-06 (×2): 1 via ORAL
  Filled 2019-10-02 (×2): qty 2
  Filled 2019-10-02 (×2): qty 1
  Filled 2019-10-02 (×5): qty 2

## 2019-10-02 MED ORDER — ALBUMIN HUMAN 5 % IV SOLN: INTRAVENOUS | Status: DC | PRN

## 2019-10-02 MED ORDER — TRANEXAMIC ACID-NACL 1000-0.7 MG/100ML-% IV SOLN
INTRAVENOUS | Status: AC
  Filled 2019-10-02: qty 100

## 2019-10-02 MED ORDER — 0.9 % SODIUM CHLORIDE (POUR BTL) OPTIME
TOPICAL | Status: DC | PRN
  Administered 2019-10-02: 1000 mL

## 2019-10-02 MED ORDER — HYDROCODONE-ACETAMINOPHEN 7.5-325 MG PO TABS
1.0000 | ORAL_TABLET | ORAL | Status: DC | PRN
  Administered 2019-10-05 – 2019-10-06 (×4): 1 via ORAL
  Filled 2019-10-02 (×5): qty 1

## 2019-10-02 MED ORDER — ADULT MULTIVITAMIN W/MINERALS CH
1.0000 | ORAL_TABLET | Freq: Every day | ORAL | Status: DC
  Administered 2019-10-03 – 2019-10-07 (×5): 1 via ORAL
  Filled 2019-10-02 (×5): qty 1

## 2019-10-02 MED ORDER — ROCURONIUM BROMIDE 10 MG/ML (PF) SYRINGE
PREFILLED_SYRINGE | INTRAVENOUS | Status: AC
  Filled 2019-10-02: qty 30

## 2019-10-02 MED ORDER — ASPIRIN EC 81 MG PO TBEC
81.0000 mg | DELAYED_RELEASE_TABLET | Freq: Every day | ORAL | Status: DC
  Administered 2019-10-03 – 2019-10-07 (×5): 81 mg via ORAL
  Filled 2019-10-02 (×5): qty 1

## 2019-10-02 MED ORDER — TRANEXAMIC ACID-NACL 1000-0.7 MG/100ML-% IV SOLN
1000.0000 mg | INTRAVENOUS | Status: AC
  Administered 2019-10-02: 1000 mg via INTRAVENOUS

## 2019-10-02 MED ORDER — HYDROMORPHONE HCL 1 MG/ML IJ SOLN
INTRAMUSCULAR | Status: AC
  Filled 2019-10-02: qty 1

## 2019-10-02 MED ORDER — LIDOCAINE HCL (CARDIAC) PF 100 MG/5ML IV SOSY
PREFILLED_SYRINGE | INTRAVENOUS | Status: DC | PRN
  Administered 2019-10-02: 80 mg via INTRAVENOUS

## 2019-10-02 MED ORDER — MORPHINE SULFATE (PF) 2 MG/ML IV SOLN: 0.5000 mg | INTRAVENOUS | Status: DC | PRN

## 2019-10-02 MED ORDER — PROPOFOL 10 MG/ML IV BOLUS
INTRAVENOUS | Status: AC
  Filled 2019-10-02: qty 20

## 2019-10-02 MED ORDER — DEXAMETHASONE SODIUM PHOSPHATE 10 MG/ML IJ SOLN
INTRAMUSCULAR | Status: AC
  Filled 2019-10-02: qty 2

## 2019-10-02 MED ORDER — ALUM & MAG HYDROXIDE-SIMETH 200-200-20 MG/5ML PO SUSP: 30.0000 mL | ORAL | Status: DC | PRN

## 2019-10-02 SURGICAL SUPPLY — 81 items
APL SKNCLS STERI-STRIP NONHPOA (GAUZE/BANDAGES/DRESSINGS) ×1
BANDAGE ESMARK 6X9 LF (GAUZE/BANDAGES/DRESSINGS) IMPLANT
BENZOIN TINCTURE PRP APPL 2/3 (GAUZE/BANDAGES/DRESSINGS) ×3 IMPLANT
BLADE CLIPPER SURG (BLADE) IMPLANT
BLADE SAW SGTL 18X1.27X75 (BLADE) ×2 IMPLANT
BLADE SAW SGTL 18X1.27X75MM (BLADE) ×1
BNDG CMPR 9X6 STRL LF SNTH (GAUZE/BANDAGES/DRESSINGS)
BNDG COHESIVE 4X5 TAN STRL (GAUZE/BANDAGES/DRESSINGS) IMPLANT
BNDG ELASTIC 4X5.8 VLCR STR LF (GAUZE/BANDAGES/DRESSINGS) IMPLANT
BNDG ELASTIC 6X5.8 VLCR STR LF (GAUZE/BANDAGES/DRESSINGS) IMPLANT
BNDG ESMARK 6X9 LF (GAUZE/BANDAGES/DRESSINGS)
BNDG GAUZE ELAST 4 BULKY (GAUZE/BANDAGES/DRESSINGS) ×1 IMPLANT
CLOSURE WOUND 1/2 X4 (GAUZE/BANDAGES/DRESSINGS)
COVER SURGICAL LIGHT HANDLE (MISCELLANEOUS) ×3 IMPLANT
COVER WAND RF STERILE (DRAPES) ×1 IMPLANT
CUFF TOURN SGL QUICK 34 (TOURNIQUET CUFF)
CUFF TOURN SGL QUICK 42 (TOURNIQUET CUFF) IMPLANT
CUFF TRNQT CYL 34X4.125X (TOURNIQUET CUFF) IMPLANT
CUP SECTOR GRIPTON 50MM (Cup) ×2 IMPLANT
DRAPE C-ARM 42X72 X-RAY (DRAPES) ×3 IMPLANT
DRAPE HALF SHEET 40X57 (DRAPES) IMPLANT
DRAPE INCISE IOBAN 66X45 STRL (DRAPES) IMPLANT
DRAPE ORTHO SPLIT 77X108 STRL (DRAPES)
DRAPE STERI IOBAN 125X83 (DRAPES) ×3 IMPLANT
DRAPE SURG ORHT 6 SPLT 77X108 (DRAPES) IMPLANT
DRAPE U-SHAPE 47X51 STRL (DRAPES) ×9 IMPLANT
DRSG AQUACEL AG ADV 3.5X10 (GAUZE/BANDAGES/DRESSINGS) ×3 IMPLANT
DRSG EMULSION OIL 3X3 NADH (GAUZE/BANDAGES/DRESSINGS) ×1 IMPLANT
DRSG PAD ABDOMINAL 8X10 ST (GAUZE/BANDAGES/DRESSINGS) ×1 IMPLANT
DURAPREP 26ML APPLICATOR (WOUND CARE) ×3 IMPLANT
ELECT BLADE 4.0 EZ CLEAN MEGAD (MISCELLANEOUS) ×3
ELECT BLADE 6.5 EXT (BLADE) IMPLANT
ELECT REM PT RETURN 9FT ADLT (ELECTROSURGICAL) ×3
ELECTRODE BLDE 4.0 EZ CLN MEGD (MISCELLANEOUS) ×1 IMPLANT
ELECTRODE REM PT RTRN 9FT ADLT (ELECTROSURGICAL) ×1 IMPLANT
FACESHIELD WRAPAROUND (MASK) ×6 IMPLANT
FACESHIELD WRAPAROUND OR TEAM (MASK) ×2 IMPLANT
GAUZE SPONGE 4X4 12PLY STRL (GAUZE/BANDAGES/DRESSINGS) ×1 IMPLANT
GAUZE XEROFORM 1X8 LF (GAUZE/BANDAGES/DRESSINGS) ×2 IMPLANT
GLOVE BIO SURGEON STRL SZ8 (GLOVE) ×3 IMPLANT
GLOVE BIOGEL PI IND STRL 8 (GLOVE) ×2 IMPLANT
GLOVE BIOGEL PI INDICATOR 8 (GLOVE) ×4
GLOVE ECLIPSE 8.0 STRL XLNG CF (GLOVE) ×3 IMPLANT
GLOVE ORTHO TXT STRL SZ7.5 (GLOVE) ×6 IMPLANT
GOWN STRL REUS W/ TWL LRG LVL3 (GOWN DISPOSABLE) ×3 IMPLANT
GOWN STRL REUS W/ TWL XL LVL3 (GOWN DISPOSABLE) ×2 IMPLANT
GOWN STRL REUS W/TWL LRG LVL3 (GOWN DISPOSABLE) ×9
GOWN STRL REUS W/TWL XL LVL3 (GOWN DISPOSABLE) ×6
HANDPIECE INTERPULSE COAX TIP (DISPOSABLE) ×3
HEAD FEM STD 32X+1 STRL (Hips) ×2 IMPLANT
KIT BASIN OR (CUSTOM PROCEDURE TRAY) ×3 IMPLANT
KIT TURNOVER KIT B (KITS) ×3 IMPLANT
LINER ACETABULAR 32X50 (Liner) ×2 IMPLANT
MANIFOLD NEPTUNE II (INSTRUMENTS) ×3 IMPLANT
NS IRRIG 1000ML POUR BTL (IV SOLUTION) ×3 IMPLANT
PACK GENERAL/GYN (CUSTOM PROCEDURE TRAY) ×3 IMPLANT
PACK TOTAL JOINT (CUSTOM PROCEDURE TRAY) ×1 IMPLANT
PAD ARMBOARD 7.5X6 YLW CONV (MISCELLANEOUS) ×2 IMPLANT
PAD CAST 4YDX4 CTTN HI CHSV (CAST SUPPLIES) ×1 IMPLANT
PADDING CAST COTTON 4X4 STRL (CAST SUPPLIES)
SET HNDPC FAN SPRY TIP SCT (DISPOSABLE) ×1 IMPLANT
SPONGE LAP 18X18 X RAY DECT (DISPOSABLE) ×2 IMPLANT
STAPLER VISISTAT 35W (STAPLE) ×5 IMPLANT
STEM CORAIL KA11 (Stem) ×2 IMPLANT
STOCKINETTE IMPERVIOUS 9X36 MD (GAUZE/BANDAGES/DRESSINGS) IMPLANT
STRIP CLOSURE SKIN 1/2X4 (GAUZE/BANDAGES/DRESSINGS) ×2 IMPLANT
SUT ETHIBOND NAB CT1 #1 30IN (SUTURE) ×3 IMPLANT
SUT ETHILON 4 0 FS 1 (SUTURE) IMPLANT
SUT MNCRL AB 4-0 PS2 18 (SUTURE) IMPLANT
SUT VIC AB 0 CT1 27 (SUTURE) ×6
SUT VIC AB 0 CT1 27XBRD ANBCTR (SUTURE) ×1 IMPLANT
SUT VIC AB 1 CT1 27 (SUTURE) ×3
SUT VIC AB 1 CT1 27XBRD ANBCTR (SUTURE) ×1 IMPLANT
SUT VIC AB 2-0 CT1 27 (SUTURE) ×3
SUT VIC AB 2-0 CT1 TAPERPNT 27 (SUTURE) ×1 IMPLANT
TOWEL GREEN STERILE (TOWEL DISPOSABLE) ×3 IMPLANT
TOWEL GREEN STERILE FF (TOWEL DISPOSABLE) ×1 IMPLANT
TRAY CATH 16FR W/PLASTIC CATH (SET/KITS/TRAYS/PACK) IMPLANT
TRAY FOLEY W/BAG SLVR 16FR (SET/KITS/TRAYS/PACK)
TRAY FOLEY W/BAG SLVR 16FR ST (SET/KITS/TRAYS/PACK) IMPLANT
WATER STERILE IRR 1000ML POUR (IV SOLUTION) ×6 IMPLANT

## 2019-10-02 NOTE — Brief Op Note (Signed)
10/02/2019  5:01 PM  PATIENT:  Michaela Simpson  86 y.o. female  PRE-OPERATIVE DIAGNOSIS:  posttraumatic osteonecrosis of the femoral head on the left hip, RETAINED ORTHOPEDIC HARDWARE  POST-OPERATIVE DIAGNOSIS:  posttraumatic osteonecrosis of the femoral head on the left hip, RETAINED ORTHOPEDIC HARDWARE  PROCEDURE:  Procedure(s): LEFT TOTAL HIP ARTHROPLASTY ANTERIOR APPROACH AND REMOVAL OF CANNULATED SCREWS OF LEFT HIP (Left) HARDWARE REMOVAL (Left)  SURGEON:  Surgeon(s) and Role:    Mcarthur Rossetti, MD - Primary  PHYSICIAN ASSISTANT: Benita Stabile, PA-C  ANESTHESIA:   general  EBL:  300 mL   COUNTS:  YES  DICTATION: .Other Dictation: Dictation Number [id number]  PLAN OF CARE: Admit to inpatient   PATIENT DISPOSITION:  PACU - hemodynamically stable.   Delay start of Pharmacological VTE agent (>24hrs) due to surgical blood loss or risk of bleeding: no

## 2019-10-02 NOTE — H&P (Signed)
TOTAL HIP ADMISSION H&P  Patient is admitted for left total hip arthroplasty.  Subjective:  Chief Complaint: left hip pain  HPI: Michaela Simpson, 86 y.o. female, has a history of pain and functional disability in the left hip(s) due to trauma and patient has failed non-surgical conservative treatments for greater than 12 weeks to include NSAID's and/or analgesics, corticosteriod injections, flexibility and strengthening excercises, supervised PT with diminished ADL's post treatment, use of assistive devices and activity modification.  Onset of symptoms was gradual starting 1 years ago with gradually worsening course since that time.The patient noted prior procedures of the hip to include cannulated screw fixation of a left hip femoral neck fracture on the left hip(s).  Patient currently rates pain in the left hip at 10 out of 10 with activity. Patient has night pain, worsening of pain with activity and weight bearing, trendelenberg gait, pain that interfers with activities of daily living and pain with passive range of motion. Patient has evidence of subchondral cysts and joint space narrowing by imaging studies. This condition presents safety issues increasing the risk of falls.  There is no current active infection.  Patient Active Problem List   Diagnosis Date Noted  . Traumatic arthritis of left hip 10/02/2019  . Protein-calorie malnutrition, severe 09/21/2019  . Closed pelvic fracture ([place]) 09/19/2019  . S/P TAVR (transcatheter aortic valve replacement) 08/08/2019  . Severe aortic stenosis 07/19/2019  . Essential hypertension, benign 03/15/2019  . Sequelae of open wound of right lower extremity 03/15/2019  . Major depression, recurrent, chronic ([place]) 03/15/2019  . Chronic constipation 03/15/2019  . Closed left humeral fracture 03/08/2019  . Wound of right leg, initial encounter 03/08/2019  . Leukocytosis 03/08/2019  . Fracture of femoral neck, left, closed ([place]) 03/08/2019  . AKI  (acute kidney injury) ([place]) 03/08/2019  . Depression   . HTN (hypertension)   . Bilateral carpal tunnel syndrome 05/15/2018  . Rash and nonspecific skin eruption 04/06/2015  . Acute blood loss anemia 03/22/2015  . Edema 03/21/2015  . Acute renal failure syndrome ([place])   . Closed right hip fracture ([place]) 03/14/2015  . Rheumatoid arthritis ([place]) 03/14/2015  . Fall 03/14/2015   Past Medical History:  Diagnosis Date  . AKI (acute kidney injury) ([place])   . Anemia   . Aortic stenosis   . Bruising    large bruise to left neck and T/O body  . Depression   . Early cataract   . Headache   . Hip fracture ([place])   . HTN (hypertension)   . Humerus fracture   . Leukocytosis   . Osteonecrosis ([place])    post-traumatic osteonecrosis of the femoral head of the left hip, retained orthopedic hardware ( order for conset)  . Osteoporosis   . Peripheral vascular disease ([place])    blood clot right leg  . Rheumatoid arthritis ([place])   . Thoracic ascending aortic aneurysm ([facility])    4.4 cm by 07/26/19 CTA  . Ulcer of lower extremity ([place])   . Wears glasses     Past Surgical History:  Procedure Laterality Date  . ABDOMINAL AORTOGRAM W/LOWER EXTREMITY N/A 05/08/2019   Procedure: ABDOMINAL AORTOGRAM W/LOWER EXTREMITY;  Surgeon: Serafina Mitchell, MD;  Location: [facility];  Service: Cardiovascular;  Laterality: N/A;  . HIP PINNING,CANNULATED Left 03/09/2019   Procedure: CANNULATED HIP PINNING;  Surgeon: Mcarthur Rossetti, MD;  Location: [facility];  Service: Orthopedics;  Laterality: Left;  . INTRAMEDULLARY (IM) NAIL INTERTROCHANTERIC Right 03/14/2015  Procedure: RIGHT INTERTROCHANTRIC INTRAMEDULLARY (IM) NAIL ;  Surgeon: Mcarthur Rossetti, MD;  Location: [facility];  Service: Orthopedics;  Laterality: Right;  . PERIPHERAL VASCULAR INTERVENTION Right 05/08/2019   Procedure: PERIPHERAL VASCULAR INTERVENTION;  Surgeon: Serafina Mitchell, MD;  Location: [facility];  Service: Cardiovascular;   Laterality: Right;  . RIGHT/LEFT HEART CATH AND CORONARY ANGIOGRAPHY N/A 07/19/2019   Procedure: RIGHT/LEFT HEART CATH AND CORONARY ANGIOGRAPHY;  Surgeon: Sherren Mocha, MD;  Location: [facility];  Service: Cardiovascular;  Laterality: N/A;  . TEE WITHOUT CARDIOVERSION N/A 08/07/2019   Procedure: TRANSESOPHAGEAL ECHOCARDIOGRAM (TEE);  Surgeon: Sherren Mocha, MD;  Location: [facility];  Service: Open Heart Surgery;  Laterality: N/A;  . TRANSCATHETER AORTIC VALVE REPLACEMENT, TRANSFEMORAL  08/07/2019  . TRANSCATHETER AORTIC VALVE REPLACEMENT, TRANSFEMORAL N/A 08/07/2019   Procedure: TRANSCATHETER AORTIC VALVE REPLACEMENT, TRANSFEMORAL;  Surgeon: Sherren Mocha, MD;  Location: [facility];  Service: Open Heart Surgery;  Laterality: N/A;    Current Facility-Administered Medications  Medication Dose Route Frequency Provider Last Rate Last Admin  . clindamycin (CLEOCIN) 900 MG/50ML IVPB           . [START ON 10/03/2019] clindamycin (CLEOCIN) IVPB 900 mg  900 mg Intravenous On Call to OR Mcarthur Rossetti, MD      . lactated ringers infusion   Intravenous Continuous Annye Asa, MD 10 mL/hr at 10/02/19 1327 New Bag at 10/02/19 1327  . povidone-iodine 10 % swab 2 application  2 application Topical Once Mcarthur Rossetti, MD      . tranexamic acid (CYKLOKAPRON) 1000MG /167mL IVPB           . tranexamic acid (CYKLOKAPRON) IVPB 1,000 mg  1,000 mg Intravenous To OR Mcarthur Rossetti, MD       Allergies  Allergen Reactions  . Penicillins Nausea And Vomiting and Other (See Comments)    Child hood allergy - Flu symptoms; constant vomiting  Did it involve swelling of the face/tongue/throat, SOB, or low BP? Unknown Did it involve sudden or severe rash/hives, skin peeling, or any reaction on the inside of your mouth or nose? Unknown Did you need to seek medical attention at a hospital or doctor's office? Unknown When did it last happen?50+ years If all  above answers are "NO", may proceed with cephalosporin use.   . Codeine Nausea And Vomiting and Rash    INTOLERANCE >  VOMITING    Social History   Tobacco Use  . Smoking status: Never Smoker  . Smokeless tobacco: Never Used  Substance Use Topics  . Alcohol use: No    Family History  Problem Relation Age of Onset  . Cancer Sister      Review of Systems  Constitutional: Positive for fatigue.  Musculoskeletal: Positive for gait problem.  Neurological: Positive for weakness.  All other systems reviewed and are negative.   Objective:  Physical Exam  Constitutional: She is oriented to person, place, and time. She appears well-developed and well-nourished.  HENT:  Head: Normocephalic and atraumatic.  Eyes: Pupils are equal, round, and reactive to light.  Cardiovascular: Normal rate.  Respiratory: Effort normal.  GI: Soft.  Musculoskeletal:     Cervical back: Normal range of motion and neck supple.     Left hip: Tenderness and bony tenderness present. Decreased range of motion. Decreased strength.  Neurological: She is alert and oriented to person, place, and time.  Skin: Skin is warm and dry.  Psychiatric: She has a normal mood  and affect.    Vital signs in last 24 hours: Temp:  [98.7 F (37.1 C)] 98.7 F (37.1 C) (04/13 1257) Pulse Rate:  [77] 77 (04/13 1257) Resp:  [18] 18 (04/13 1257) BP: (167)/(66) 167/66 (04/13 1257) SpO2:  [99 %] 99 % (04/13 1257)  Labs:   Estimated body mass index is 20.31 kg/m as calculated from the following:   Height as of 09/26/19: 5' (1.524 m).   Weight as of 09/26/19: 47.2 kg.   Imaging Review Plain radiographs demonstrate severe degenerative joint disease of the left hip(s) with osteonecrosis and retained cannulated screws. The bone quality appears to be fair for age and reported activity level.      Assessment/Plan:  End stage arthritis, left hip(s)  The patient history, physical examination, clinical judgement of the  provider and imaging studies are consistent with end stage degenerative joint disease of the left hip(s) and total hip arthroplasty is deemed medically necessary. The treatment options including medical management, injection therapy, arthroscopy and arthroplasty were discussed at length. The risks and benefits of total hip arthroplasty were presented and reviewed. The risks due to aseptic loosening, infection, stiffness, dislocation/subluxation,  thromboembolic complications and other imponderables were discussed.  The patient acknowledged the explanation, agreed to proceed with the plan and consent was signed. Patient is being admitted for inpatient treatment for surgery, pain control, PT, OT, prophylactic antibiotics, VTE prophylaxis, progressive ambulation and ADL's and discharge planning.The patient is planning to be discharged to be determined

## 2019-10-02 NOTE — Op Note (Signed)
NAME: ARLIE, FROHN MEDICAL RECORD [id number] ACCOUNT [id number] DATE OF BIRTH:1931-08-08 FACILITY: [facility] LOCATION: [facility] PHYSICIAN:Bradon Fester [place], MD  OPERATIVE REPORT  DATE OF PROCEDURE:  10/02/2019  PREOPERATIVE DIAGNOSIS:  Osteonecrosis with post-traumatic arthritis, left hip, status post cannulated screw fixation of the femoral neck fracture.  POSTOPERATIVE DIAGNOSIS:  Osteonecrosis with post-traumatic arthritis, left hip, status post cannulated screw fixation of the femoral neck fracture.  PROCEDURES: 1.  Removal of deep buried implants (cannulated screws), left hip. 2.  Left total hip arthroplasty through direct anterior approach.  IMPLANTS:  DePuy Sector Gription acetabular component size 50, size 32+0 neutral polyethylene liner, size 11 Corail femoral component with standard offset, size 32+1 metal hip ball.  SURGEON:  Lind Guest. Ninfa Linden, MD  ASSISTANT:  Erskine Emery, PA-C  ANESTHESIA:  General.  ANTIBIOTICS:  900 mg IV clindamycin.  ESTIMATED BLOOD LOSS:  300 mL.  COMPLICATIONS:  None.  INDICATIONS:  The patient is an 86 year old female who sustained a left hip nondisplaced femoral neck fracture in September of last year.  She had fixation of this hip fracture with cannulated screws.  She has gone on to develop osteonecrosis of the  femoral head due to a decrease in blood supply and obvious osteonecrosis.  Her hip pain has now become daily and is detrimentally affecting her mobility, her quality of life and her activities of daily living.  At this point, we have recommended a total  hip arthroplasty through direct anterior approach with also removal of her previous cannulated screws.  I discussed in detail what the surgery involves, including a long and thorough discussion with her and her family about the risks and benefits of this  surgery.  Her family does wish to proceed with this given her decreased mobility and her poor quality of life  due to severe left hip pain.  DESCRIPTION OF PROCEDURE:  After informed consent was obtained, the appropriate left hip was marked.  She was brought to the operating room.  General anesthesia was obtained while she was on the stretcher.  Traction boots were placed on both her feet.   Next, she was placed supine on the Hana fracture table, the perineal post in place and both legs in line skeletal traction device and no traction applied.  Her left operative hip was prepped and draped with DuraPrep and sterile drapes.  A time-out was  called.  She was identified as correct patient, correct left hip.  I then made an incision just inferior and posterior to the anterior defect spine and carried this obliquely down the leg.  I dissected down to the tensor fascia lata muscle.  The tensor  fascia was divided longitudinally to proceed with direct anterior approach to the hip.  I then made an incision over the lateral aspect of the hip so I could dissect down to the cannulated screws.  I removed the 3 cannulated screws without difficulty.  I  then opened up the hip capsule in an L-type format, finding a large joint effusion and significant osteoarthritis of the femoral head and neck.  I placed Cobra retractors around the medial and lateral femoral neck after cauterizing the lateral femoral  circumflex vessels.  I then made a freshening femoral neck cut just proximal to the lesser trochanter and distal to her previous hip femoral neck fracture.  We then placed a corkscrew guide in the femoral head and found significant osteonecrosis of the  femoral head.  I then placed a bent Hohmann over the medial acetabular  rim and removed remnants of the acetabular labrum and other debris from the hip socket.  I then began reaming under direct visualization and direct fluoroscopy from a size 44 reamer  in stepwise increments up to a size 49.  With all reamers under direct visualization, the last reamer was also placed under direct  fluoroscopy so I could obtain my depth of reaming, our inclination and anteversion.  We had a nice press-fit to it, so I  did not need to place any screws.  I then placed a 32+0 neutral polyethylene liner for that size acetabular component.  Even though I medialized based on her leg lengths and what we were seeing radiographically, we still felt comfortable with placing a 0  liner.  Attention was then turned to the femur.  With the leg externally rotated to 120 degrees, extended and adducted, we were able to place a Mueller retractor medially and Hohman retractor behind the greater trochanter, released the lateral joint  capsule and used a box-cutting osteotome to enter the femoral canal and a rongeur to lateralize.  We then began broaching using the Corail broaching system from a size 8 going up to a size 11.  With the size 11 in place, we trialed a standard offset  femoral neck and a 32+1 hip ball.  We brought the leg back over and up and with traction, internal rotation, reducing the pelvis.  We were pleased with leg length, offset, range of motion and stability assessed radiographically and  clinically/mechanically.  We then dislocated the hip and removed the trial components.  I placed the real Corail femoral component size 11 with standard offset and the real 32+1 metal hip ball and again reduced this in the acetabulum and it was stable on  exam and radiographically.  We then irrigated the soft tissue with normal saline solution using pulsatile lavage.  I was able to reapproximate the joint capsule with interrupted #1 Ethibond suture, followed by closing the tensor fascia with #1 Vicryl.   Zero Vicryl was used to close the deep tissue, 2-0 Vicryl was used to close the subcutaneous tissue and staples were used to reapproximate the skin.  The lateral incision was also closed with the deep tissue was 0 Vicryl, followed by 2-0 Vicryl to close  the subcutaneous tissue and interrupted staples on the skin.   Two  Xeroforms and Aquacel dressings were applied.  The patient was taken off the Hana table, awakened, extubated, and taken to the recovery room in stable condition.  All final counts were  correct.  There were no complications noted.  Of note, Benita Stabile, PA-C, assisted during the entire case.  His assistance was crucial for facilitating all aspects of this case.  VN/NUANCE  D:10/02/2019 T:10/02/2019 JOB:[id number]

## 2019-10-02 NOTE — Care Plan (Signed)
Ortho Bundle Case Management Note  Patient Details  Name: Michaela Simpson MRN: [id number] Date of Birth: 10-Dec-1931  [facility] pre-op call to patient to discuss her upcoming screw removal with Left THA per Dr. Ninfa Linden on 10/02/19. Reviewed Ortho bundle program and patient requested that CM reach out to her daughters Arrie Aran and Caren Griffins to review all information related to her surgery. Patient does have paid sitters/CG in the home currently, but due to recent falls with fractures/generalized weakness, her daughters are concerned regarding discharging home with home health PT only. Discussed with both daughters that the goal is to go home if there are caregivers, but did explain that CM would discuss with MD and monitor patient's status while in the hospital. CM reviewed that the "best case" is for their mother to go home with caregivers and home health PT, but is aware that there may be concerns regarding safety and this being the best thing for her. Will discuss with Dr. Ninfa Linden and follow progress after surgery with therapy to ensure the best outcome for the patient. Patient is currently active with [facility] for PT now. Referral sent to liaison- [place]. Will monitor for all needs.          DME Arranged:  (Patient has a FWW and 3in1/BSC for home use; no DME needs at this time) DME Agency:     HH Arranged:  PT [place]:  [facility] ([facility])(Patient and family report she is currently active with [facility] prior to surgery for recent falls)  Additional Comments: Please contact me with any questions of if this plan should need to change.  Jamse Arn, RN, BSN, [employer]  [phone] 10/02/2019, 2:28 PM

## 2019-10-02 NOTE — Transfer of Care (Signed)
Immediate Anesthesia Transfer of Care Note  Patient: [name]  Procedure(s) Performed: LEFT TOTAL HIP ARTHROPLASTY ANTERIOR APPROACH AND REMOVAL OF CANNULATED SCREWS OF LEFT HIP (Left Hip) HARDWARE REMOVAL (Left Hip)  Patient Location: PACU  Anesthesia Type:General  Level of Consciousness: awake and alert   Airway & Oxygen Therapy: Patient Spontanous Breathing and Patient connected to nasal cannula oxygen  Post-op Assessment: Report given to RN and Post -op Vital signs reviewed and stable  Post vital signs: Reviewed and stable  Last Vitals:  Vitals Value Taken Time  BP 178/82 10/02/19 1726  Temp 36.6 C 10/02/19 1725  Pulse 98 10/02/19 1727  Resp 11 10/02/19 1728  SpO2 96 % 10/02/19 1727  Vitals shown include unvalidated device data.  Last Pain:  Vitals:   10/02/19 1317  TempSrc:   PainSc: 0-No pain         Complications: No apparent anesthesia complications

## 2019-10-02 NOTE — Anesthesia Procedure Notes (Signed)
Procedure Name: Intubation Date/Time: 10/02/2019 3:30 PM Performed by: Derral Colucci T, CRNA Pre-anesthesia Checklist: Patient identified, Emergency Drugs available, Suction available and Patient being monitored Patient Re-evaluated:Patient Re-evaluated prior to induction Oxygen Delivery Method: Circle system utilized Preoxygenation: Pre-oxygenation with 100% oxygen Induction Type: IV induction Ventilation: Mask ventilation without difficulty Laryngoscope Size: Miller and 2 Grade View: Grade I Tube type: Oral Tube size: 7.0 mm Number of attempts: 1 Airway Equipment and Method: Stylet and Oral airway Placement Confirmation: ETT inserted through vocal cords under direct vision,  positive ETCO2 and breath sounds checked- equal and bilateral Secured at: 21 cm Tube secured with: Tape Dental Injury: Teeth and Oropharynx as per pre-operative assessment

## 2019-10-03 ENCOUNTER — Encounter: Payer: Self-pay | Admitting: *Deleted

## 2019-10-03 LAB — CBC
HCT: 27.8 % — ABNORMAL LOW (ref 36.0–46.0)
Hemoglobin: 8.5 g/dL — ABNORMAL LOW (ref 12.0–15.0)
MCH: 28 pg (ref 26.0–34.0)
MCHC: 30.6 g/dL (ref 30.0–36.0)
MCV: 91.4 fL (ref 80.0–100.0)
Platelets: 331 10*3/uL (ref 150–400)
RBC: 3.04 MIL/uL — ABNORMAL LOW (ref 3.87–5.11)
RDW: 14.2 % (ref 11.5–15.5)
WBC: 12.7 10*3/uL — ABNORMAL HIGH (ref 4.0–10.5)
nRBC: 0 % (ref 0.0–0.2)

## 2019-10-03 LAB — BASIC METABOLIC PANEL
Anion gap: 11 (ref 5–15)
BUN: 30 mg/dL — ABNORMAL HIGH (ref 8–23)
CO2: 22 mmol/L (ref 22–32)
Calcium: 8.7 mg/dL — ABNORMAL LOW (ref 8.9–10.3)
Chloride: 104 mmol/L (ref 98–111)
Creatinine, Ser: 1.06 mg/dL — ABNORMAL HIGH (ref 0.44–1.00)
GFR calc Af Amer: 55 mL/min — ABNORMAL LOW (ref >60)
GFR calc non Af Amer: 48 mL/min — ABNORMAL LOW (ref >60)
Glucose, Bld: 177 mg/dL — ABNORMAL HIGH (ref 70–99)
Potassium: 4.8 mmol/L (ref 3.5–5.1)
Sodium: 137 mmol/L (ref 135–145)

## 2019-10-03 NOTE — Evaluation (Addendum)
Physical Therapy Evaluation Patient Details Name: Michaela Simpson MRN: [id number] DOB: [birth year] Today's Date: 10/03/2019   History of Present Illness  Pt is a 86 y.o. F with significant PMH of TAVR, HTN, RA, R hip fracture (2016), L humerus fx (2020), L femoral neck fx (2020), recent pelvic fracture (08/2019), who presents with osteonecrosis wtih post traumatic arthritis following cannulated screw fixation of femoral neck fx in 2020. Now s/p L total hip arthroplasty with direct anterior approprach and removal of cannulated screws 10/02/2019.   Clinical Impression  Pt evaluated s/p procedure listed above. She is very motivated to participate and pleasant to work with. Pt demonstrating excellent pain control and overall moving fairly well. Session focused on gait training, initiation of home exercise program, and ADL's. Pt ambulating room distances with a walker at a min guard assist level. Anticipate steady progress and recommend d/c home with HHPT and 24/7 supervision initially.     Follow Up Recommendations Home health PT;Supervision/Assistance - 24 hour;Supervision for mobility/OOB    Equipment Recommendations  None recommended by PT    Recommendations for Other Services       Precautions / Restrictions Precautions Precautions: Fall Restrictions Weight Bearing Restrictions: Yes LUE Weight Bearing: Weight bearing as tolerated RLE Weight Bearing: Weight bearing as tolerated LLE Weight Bearing: Weight bearing as tolerated      Mobility  Bed Mobility Overal bed mobility: Needs Assistance Bed Mobility: Supine to Sit     Supine to sit: Min assist     General bed mobility comments: MinA for LLE negotiation, HOB elevated  Transfers Overall transfer level: Needs assistance Equipment used: Rolling walker (2 wheeled) Transfers: Sit to/from Stand Sit to Stand: Min guard         General transfer comment: Rising from edge of bed and toilet with min guard  assist  Ambulation/Gait Ambulation/Gait assistance: Min guard Gait Distance (Feet): 15 Feet Assistive device: Rolling walker (2 wheeled) Gait Pattern/deviations: Step-through pattern;Decreased stride length;Decreased stance time - left;Trunk flexed;Antalgic;Step-to pattern;Decreased weight shift to left Gait velocity: decreased   General Gait Details: Requiring min guard for stability, initiating with step to but able to progress to step through pattern. Cues provided for sequencing and for negotiating unlevel surfaces  Stairs            Wheelchair Mobility    Modified Rankin (Stroke Patients Only)       Balance Overall balance assessment: Needs assistance Sitting-balance support: No upper extremity supported Sitting balance-Leahy Scale: Good     Standing balance support: During functional activity;No upper extremity supported Standing balance-Leahy Scale: Fair Standing balance comment: Able to brush teeth at sink with supervision                             Pertinent Vitals/Pain Pain Assessment: Faces Faces Pain Scale: Hurts a little bit Pain Location: L hip Pain Descriptors / Indicators: Sore Pain Intervention(s): Monitored during session    Home Living Family/patient expects to be discharged to:: Private residence Living Arrangements: Alone Available Help at Discharge: Family;Friend(s);Available 24 hours/day;Personal care attendant Type of Home: House Home Access: Stairs to enter Entrance Stairs-Rails: Can reach both Entrance Stairs-Number of Steps: 1 Home Layout: One level Home Equipment: Walker - 2 wheels;Cane - single point;Wheelchair - manual;Toilet riser      Prior Function Level of Independence: Needs assistance   Gait / Transfers Assistance Needed: using walker  ADL's / Homemaking Assistance Needed: since prior admission, has been  requiring assist with ADL's from caregiver        Hand Dominance   Dominant Hand: Right     Extremity/Trunk Assessment   Upper Extremity Assessment Upper Extremity Assessment: Overall WFL for tasks assessed    Lower Extremity Assessment Lower Extremity Assessment: LLE deficits/detail LLE Deficits / Details: S/p THA, at least 3/5 strength     Cervical / Trunk Assessment Cervical / Trunk Assessment: Kyphotic  Communication   Communication: No difficulties  Cognition Arousal/Alertness: Awake/alert Behavior During Therapy: WFL for tasks assessed/performed Overall Cognitive Status: Within Functional Limits for tasks assessed                                        General Comments      Exercises Total Joint Exercises Ankle Circles/Pumps: Both;10 reps;Supine Quad Sets: Both;10 reps;Supine Heel Slides: AROM;AAROM;Left;5 reps;Supine Hip ABduction/ADduction: Left;10 reps;Supine Long Arc Quad: 10 reps;Both;Seated   Assessment/Plan    PT Assessment Patient needs continued PT services  PT Problem List Decreased strength;Decreased activity tolerance;Decreased mobility;Decreased balance;Pain;Other (comment)       PT Treatment Interventions Gait training;DME instruction;Stair training;Functional mobility training;Therapeutic activities;Therapeutic exercise;Balance training;Neuromuscular re-education;Patient/family education;Modalities    PT Goals (Current goals can be found in the Care Plan section)  Acute Rehab PT Goals Patient Stated Goal: to require less help PT Goal Formulation: With patient Time For Goal Achievement: 10/17/19 Potential to Achieve Goals: Good    Frequency 7X/week   Barriers to discharge        Co-evaluation               AM-PAC PT "6 Clicks" Mobility  Outcome Measure Help needed turning from your back to your side while in a flat bed without using bedrails?: None Help needed moving from lying on your back to sitting on the side of a flat bed without using bedrails?: A Little Help needed moving to and from a bed to a  chair (including a wheelchair)?: A Little Help needed standing up from a chair using your arms (e.g., wheelchair or bedside chair)?: A Little Help needed to walk in hospital room?: A Little Help needed climbing 3-5 steps with a railing? : A Little 6 Click Score: 19    End of Session Equipment Utilized During Treatment: Gait belt Activity Tolerance: Patient tolerated treatment well Patient left: in chair;with call bell/phone within reach;with chair alarm set   PT Visit Diagnosis: History of falling (Z91.81);Muscle weakness (generalized) (M62.81);Other abnormalities of gait and mobility (R26.89);Difficulty in walking, not elsewhere classified (R26.2);Pain Pain - Right/Left: Left Pain - part of body: Hip    Time: 0826-0859 PT Time Calculation (min) (ACUTE ONLY): 33 min   Charges:   PT Evaluation $PT Eval Moderate Complexity: 1 Mod PT Treatments $Therapeutic Exercise: 8-22 mins          Wyona Almas, PT, DPT Acute Rehabilitation Services Pager [phone] Office [phone]   Deno Etienne 10/03/2019, 9:57 AM

## 2019-10-03 NOTE — Progress Notes (Signed)
Subjective: 1 Day Post-Op Procedure(s) (LRB): LEFT TOTAL HIP ARTHROPLASTY ANTERIOR APPROACH AND REMOVAL OF CANNULATED SCREWS OF LEFT HIP (Left) HARDWARE REMOVAL (Left) Patient reports pain as moderate.    Objective: Vital signs in last 24 hours: Temp:  [97.4 F (36.3 C)-98.7 F (37.1 C)] 98.3 F (36.8 C) (04/14 0723) Pulse Rate:  [77-98] 86 (04/14 0723) Resp:  [10-18] 18 (04/14 0723) BP: (114-178)/(51-82) 127/51 (04/14 0723) SpO2:  [94 %-100 %] 98 % (04/14 0723)  Intake/Output from previous day: 04/13 0701 - 04/14 0700 In: 1637.9 [I.V.:1287.9; IV Piggyback:350] Out: 300 [Blood:300] Intake/Output this shift: No intake/output data recorded.  Recent Labs    10/03/19 0312  HGB 8.5*   Recent Labs    10/03/19 0312  WBC 12.7*  RBC 3.04*  HCT 27.8*  PLT 331   Recent Labs    10/03/19 0312  NA 137  K 4.8  CL 104  CO2 22  BUN 30*  CREATININE 1.06*  GLUCOSE 177*  CALCIUM 8.7*   No results for input(s): LABPT, INR in the last 72 hours.  Sensation intact distally Intact pulses distally Dorsiflexion/Plantar flexion intact Incision: dressing C/D/I   Assessment/Plan: 1 Day Post-Op Procedure(s) (LRB): LEFT TOTAL HIP ARTHROPLASTY ANTERIOR APPROACH AND REMOVAL OF CANNULATED SCREWS OF LEFT HIP (Left) HARDWARE REMOVAL (Left) Up with therapy The patient does have acute blood loss anemia from her extensive surgery.  Her vitals are stable thus far.  We will need to monitor her H&H and will transfuse if her blood levels drop and she becomes symptomatic or adversely affects her vital signs.  Her disposition will depend on progress with her mobility through therapy and therapists recommendations.     Mcarthur Rossetti 10/03/2019, 7:53 AM

## 2019-10-03 NOTE — Anesthesia Postprocedure Evaluation (Signed)
Anesthesia Post Note  Patient: Michaela Simpson  Procedure(s) Performed: LEFT TOTAL HIP ARTHROPLASTY ANTERIOR APPROACH AND REMOVAL OF CANNULATED SCREWS OF LEFT HIP (Left Hip) HARDWARE REMOVAL (Left Hip)     Patient location during evaluation: PACU Anesthesia Type: General Level of consciousness: awake and alert Pain management: pain level controlled Vital Signs Assessment: post-procedure vital signs reviewed and stable Respiratory status: spontaneous breathing, nonlabored ventilation, respiratory function stable and patient connected to nasal cannula oxygen Cardiovascular status: blood pressure returned to baseline and stable Postop Assessment: no apparent nausea or vomiting Anesthetic complications: no    Last Vitals:  Vitals:   10/03/19 0514 10/03/19 0723  BP: (!) 125/55 (!) 127/51  Pulse: 85 86  Resp: 16 18  Temp: 36.8 C 36.8 C  SpO2: 95% 98%    Last Pain:  Vitals:   10/03/19 0827  TempSrc:   PainSc: Asleep                 Cohan Stipes S

## 2019-10-03 NOTE — Progress Notes (Signed)
Physical Therapy Treatment Patient Details Name: Michaela Simpson MRN: [id number] DOB: [birth year] Today's Date: 10/03/2019    History of Present Illness Pt is a 86 y.o. F with significant PMH of TAVR, HTN, RA, R hip fracture (2016), L humerus fx (2020), L femoral neck fx (2020), recent pelvic fracture (08/2019), who presents with osteonecrosis wtih post traumatic arthritis following cannulated screw fixation of femoral neck fx in 2020. Now s/p L total hip arthroplasty with direct anterior approprach and removal of cannulated screws 10/02/2019.     PT Comments    Pt more limited by L hip pain this session, but remains motivated to participate and functioning at a similar level of mobility. Session focused on continued gait training and exercise program for LLE strengthening. Pt ambulating room distances with a walker at a min guard assist level. Will continue to progress mobility as tolerated.    Follow Up Recommendations  Home health PT;Supervision/Assistance - 24 hour;Supervision for mobility/OOB     Equipment Recommendations  None recommended by PT    Recommendations for Other Services       Precautions / Restrictions Precautions Precautions: Fall Restrictions Weight Bearing Restrictions: Yes LUE Weight Bearing: Weight bearing as tolerated RLE Weight Bearing: Weight bearing as tolerated LLE Weight Bearing: Weight bearing as tolerated    Mobility  Bed Mobility Overal bed mobility: Needs Assistance Bed Mobility: Supine to Sit     Supine to sit: Min guard     General bed mobility comments: Pt able to progress LLE off edge of bed with assist from RLE. No physical assist required. Increased time/effort  Transfers Overall transfer level: Needs assistance Equipment used: Rolling walker (2 wheeled) Transfers: Sit to/from Stand Sit to Stand: Min guard         General transfer comment: Rising from edge of bed and toilet with min guard  assist  Ambulation/Gait Ambulation/Gait assistance: Min guard Gait Distance (Feet): 15 Feet Assistive device: Rolling walker (2 wheeled) Gait Pattern/deviations: Step-through pattern;Decreased stride length;Decreased stance time - left;Trunk flexed;Antalgic;Step-to pattern;Decreased weight shift to left;Decreased step length - right Gait velocity: decreased   General Gait Details: Cues for decreased left step length, negotiating unlevel surfaces, segmental turning.   Stairs             Wheelchair Mobility    Modified Rankin (Stroke Patients Only)       Balance Overall balance assessment: Needs assistance Sitting-balance support: No upper extremity supported Sitting balance-Leahy Scale: Good     Standing balance support: During functional activity;No upper extremity supported Standing balance-Leahy Scale: Fair Standing balance comment: Able to brush teeth at sink with supervision                            Cognition Arousal/Alertness: Awake/alert Behavior During Therapy: WFL for tasks assessed/performed Overall Cognitive Status: Within Functional Limits for tasks assessed                                        Exercises Total Joint Exercises Hip ABduction/ADduction: Both;10 reps;Seated Long Arc Quad: 10 reps;Both;Seated General Exercises - Lower Extremity Hip Flexion/Marching: Both;5 reps;Seated    General Comments        Pertinent Vitals/Pain Pain Assessment: Faces Faces Pain Scale: Hurts even more Pain Location: L hip Pain Descriptors / Indicators: Sore Pain Intervention(s): Premedicated before session;Monitored during session;Limited activity within patient's tolerance  Home Living                      Prior Function            PT Goals (current goals can now be found in the care plan section) Acute Rehab PT Goals Patient Stated Goal: to require less help PT Goal Formulation: With patient Time For Goal  Achievement: 10/17/19 Potential to Achieve Goals: Good Progress towards PT goals: Progressing toward goals    Frequency    7X/week      PT Plan Current plan remains appropriate    Co-evaluation              AM-PAC PT "6 Clicks" Mobility   Outcome Measure  Help needed turning from your back to your side while in a flat bed without using bedrails?: None Help needed moving from lying on your back to sitting on the side of a flat bed without using bedrails?: A Little Help needed moving to and from a bed to a chair (including a wheelchair)?: A Little Help needed standing up from a chair using your arms (e.g., wheelchair or bedside chair)?: A Little Help needed to walk in hospital room?: A Little Help needed climbing 3-5 steps with a railing? : A Little 6 Click Score: 19    End of Session Equipment Utilized During Treatment: Gait belt Activity Tolerance: Patient tolerated treatment well Patient left: in chair;with call bell/phone within reach;with chair alarm set   PT Visit Diagnosis: History of falling (Z91.81);Muscle weakness (generalized) (M62.81);Other abnormalities of gait and mobility (R26.89);Difficulty in walking, not elsewhere classified (R26.2);Pain Pain - Right/Left: Left Pain - part of body: Hip     Time: [id number] PT Time Calculation (min) (ACUTE ONLY): 25 min  Charges:  $Gait Training: 8-22 mins $Therapeutic Activity: 8-22 mins                       Wyona Almas, PT, DPT Acute Rehabilitation Services Pager [phone] Office [phone]    Deno Etienne 10/03/2019, 5:10 PM

## 2019-10-03 NOTE — Plan of Care (Signed)

## 2019-10-03 NOTE — Plan of Care (Signed)

## 2019-10-04 ENCOUNTER — Encounter (HOSPITAL_COMMUNITY): Payer: Self-pay | Admitting: Orthopaedic Surgery

## 2019-10-04 LAB — CBC
HCT: 24.5 % — ABNORMAL LOW (ref 36.0–46.0)
Hemoglobin: 7.5 g/dL — ABNORMAL LOW (ref 12.0–15.0)
MCH: 28 pg (ref 26.0–34.0)
MCHC: 30.6 g/dL (ref 30.0–36.0)
MCV: 91.4 fL (ref 80.0–100.0)
Platelets: 283 10*3/uL (ref 150–400)
RBC: 2.68 MIL/uL — ABNORMAL LOW (ref 3.87–5.11)
RDW: 14.6 % (ref 11.5–15.5)
WBC: 9.8 10*3/uL (ref 4.0–10.5)
nRBC: 0 % (ref 0.0–0.2)

## 2019-10-04 LAB — PREPARE RBC (CROSSMATCH)

## 2019-10-04 LAB — SARS CORONAVIRUS 2 (TAT 6-24 HRS): SARS Coronavirus 2: NEGATIVE

## 2019-10-04 MED ORDER — SODIUM CHLORIDE 0.9% IV SOLUTION: Freq: Once | INTRAVENOUS | Status: AC

## 2019-10-04 NOTE — Plan of Care (Signed)
  Problem: Education: Goal: Knowledge of General Education information will improve Description: Including pain rating scale, medication(s)/side effects and non-pharmacologic comfort measures Outcome: Progressing   Problem: Activity: Goal: Risk for activity intolerance will decrease Outcome: Progressing   Problem: Coping: Goal: Level of anxiety will decrease Outcome: Progressing   

## 2019-10-04 NOTE — Progress Notes (Signed)
Patient ID: Michaela Simpson, female   DOB: 1932-04-04, 86 y.o.   MRN: [id number] No acute changes.  Vitals stable and left hip stable.  However, hgb down to 7.5.  Will transfuse one unit of PRBC's today.  Anticipate discharge to home tomorrow afternoon if continues to progress.

## 2019-10-04 NOTE — TOC Initial Note (Signed)
Transition of Care (TOC) - Initial/Assessment Note    Patient Details  Name: Michaela Simpson MRN: [id number] Date of Birth: 03/04/1932  Transition of Care (TOC) CM/SW Contact:    Michelle R Stubbldfield, RN Phone Number: 10/04/2019, 2:17 PM  Clinical Narrative:                 Case management met with the patient and daughter at the bedside.  S/P Left total hip arthroplasty anterior with removal of screws.  Patient is planning on going home with home health with paid sitters from family - if at all possible.  At the moment, the patient is receiving PRBC infusion due to surgery blood loss.  Will continue to assess - if patient is unable to go home - family is considering the [facility]. Will continue to follow patient's progress.  Expected Discharge Plan: Home w Home Health Services Barriers to Discharge: Continued Medical Work up   Patient Goals and CMS Choice Patient states their goals for this hospitalization and ongoing recovery are:: I'm not feeling well right now but I'd like to go home when I'm discharged. CMS Medicare.gov Compare Post Acute Care list provided to:: Patient Choice offered to / list presented to : Adult Children  Expected Discharge Plan and Services Expected Discharge Plan: Home w Home Health Services   Discharge Planning Services: CM Consult Post Acute Care Choice: Home Health Living arrangements for the past 2 months: Single Family Home                 DME Arranged: (Patient has a FWW and 3in1/BSC for home use; no DME needs at this time)         HH Arranged: PT, OT HH Agency[facility] through Dr. Blackman's office prior to surgery)        Prior Living Arrangements/Services Living arrangements for the past 2 months: Single Family Home Lives with:: Self(Has sitters for recovery at home - self paid by family) Patient language and need for interpreter reviewed:: Yes        Need  for Family Participation in Patient Care: Yes (Comment) Care giver support system in place?: Yes (comment) Current home services: Sitter Criminal Activity/Legal Involvement Pertinent to Current Situation/Hospitalization: No - Comment as needed  Activities of Daily Living Home Assistive Devices/Equipment: Eyeglasses, Cane (specify quad or straight) ADL Screening (condition at time of admission) Patient's cognitive ability adequate to safely complete daily activities?: Yes Is the patient deaf or have difficulty hearing?: No Does the patient have difficulty seeing, even when wearing glasses/contacts?: No Does the patient have difficulty concentrating, remembering, or making decisions?: No Patient able to express need for assistance with ADLs?: Yes Does the patient have difficulty dressing or bathing?: No Independently performs ADLs?: Yes (appropriate for developmental age) Does the patient have difficulty walking or climbing stairs?: Yes Weakness of Legs: Left Weakness of Arms/Hands: None  Permission Sought/Granted Permission sought to share information with : Case Manager Permission granted to share information with : Yes, Verbal Permission Granted              Emotional Assessment Appearance:: Appears stated age   Affect (typically observed): Accepting Orientation: : Oriented to Self, Oriented to Place, Oriented to  Time, Oriented to Situation Alcohol / Substance Use: Not Applicable, Never Used Psych Involvement: No (comment)  Admission diagnosis:  Status post total replacement of left hip [Z96.642] Patient Active Problem List   Diagnosis Date Noted  .   Traumatic arthritis of left hip 10/02/2019  . Status post total replacement of left hip 10/02/2019  . Protein-calorie malnutrition, severe 09/21/2019  . Closed pelvic fracture (HCC) 09/19/2019  . S/P TAVR (transcatheter aortic valve replacement) 08/08/2019  . Severe aortic stenosis 07/19/2019  . Essential hypertension, benign  03/15/2019  . Sequelae of open wound of right lower extremity 03/15/2019  . Major depression, recurrent, chronic (HCC) 03/15/2019  . Chronic constipation 03/15/2019  . Closed left humeral fracture 03/08/2019  . Wound of right leg, initial encounter 03/08/2019  . Leukocytosis 03/08/2019  . Fracture of femoral neck, left, closed (HCC) 03/08/2019  . AKI (acute kidney injury) (HCC) 03/08/2019  . Depression   . HTN (hypertension)   . Bilateral carpal tunnel syndrome 05/15/2018  . Rash and nonspecific skin eruption 04/06/2015  . Acute blood loss anemia 03/22/2015  . Edema 03/21/2015  . Acute renal failure syndrome (HCC)   . Closed right hip fracture (HCC) 03/14/2015  . Rheumatoid arthritis (HCC) 03/14/2015  . Fall 03/14/2015   PCP:  Hall, John Z, MD Pharmacy:   [id number] - [place], [place] - [address] AT [address]  [place] [address] Phone: [phone] Fax: [phone]     Social Determinants of Health (SDOH) Interventions    Readmission Risk Interventions Readmission Risk Prevention Plan 08/08/2019  Post Dischage Appt Complete  Medication Screening Complete  Transportation Screening Complete  Some recent data might be hidden    

## 2019-10-04 NOTE — Progress Notes (Signed)
PT Cancellation Note  Patient Details Name: Michaela Simpson MRN: [id number] DOB: June 18, 1932   Cancelled Treatment:    Reason Eval/Treat Not Completed: Other (comment) Pt politely declining currently due to nausea.    Wyona Almas, PT, DPT Acute Rehabilitation Services Pager [phone] Office [phone]    Deno Etienne 10/04/2019, 10:31 AM

## 2019-10-04 NOTE — Progress Notes (Signed)
Physical Therapy Treatment Patient Details Name: Michaela Simpson MRN: [id number] DOB: 07/19/1931 Today's Date: 10/04/2019    History of Present Illness Pt is a 86 y.o. F with significant PMH of TAVR, HTN, RA, R hip fracture (2016), L humerus fx (2020), L femoral neck fx (2020), recent pelvic fracture (08/2019), who presents with osteonecrosis wtih post traumatic arthritis following cannulated screw fixation of femoral neck fx in 2020. Now s/p L total hip arthroplasty with direct anterior approprach and removal of cannulated screws 10/02/2019.     PT Comments    Pt more limited by pain, nausea, and general malaise this session. Able to participate in bed level exercises and ambulate 5 feet from bed to chair with a walker. Mobilizing at a min guard assist level. BP stable. Hopeful with upcoming transfusion, improved appetite, and continued pain control, pt will improve.     Follow Up Recommendations  Home health PT;Supervision/Assistance - 24 hour;Supervision for mobility/OOB     Equipment Recommendations  None recommended by PT    Recommendations for Other Services       Precautions / Restrictions Precautions Precautions: Fall Restrictions Weight Bearing Restrictions: Yes LUE Weight Bearing: Weight bearing as tolerated RLE Weight Bearing: Weight bearing as tolerated LLE Weight Bearing: Weight bearing as tolerated    Mobility  Bed Mobility Overal bed mobility: Needs Assistance Bed Mobility: Supine to Sit     Supine to sit: Min assist     General bed mobility comments: MinA to pull trunk to upright. Able to manage BLE's  Transfers Overall transfer level: Needs assistance Equipment used: Rolling walker (2 wheeled) Transfers: Sit to/from Stand Sit to Stand: Min guard         General transfer comment: Min guard to rise from edge of bed  Ambulation/Gait Ambulation/Gait assistance: Min guard Gait Distance (Feet): 5 Feet Assistive device: Rolling walker (2  wheeled) Gait Pattern/deviations: Step-through pattern;Decreased stride length;Decreased stance time - left;Trunk flexed;Antalgic;Step-to pattern;Decreased weight shift to left;Decreased step length - right Gait velocity: decreased   General Gait Details: Cues for sequencing and direction.   Stairs             Wheelchair Mobility    Modified Rankin (Stroke Patients Only)       Balance Overall balance assessment: Needs assistance Sitting-balance support: No upper extremity supported Sitting balance-Leahy Scale: Good     Standing balance support: During functional activity;No upper extremity supported Standing balance-Leahy Scale: Fair                              Cognition Arousal/Alertness: Awake/alert Behavior During Therapy: WFL for tasks assessed/performed Overall Cognitive Status: Impaired/Different from baseline Area of Impairment: Attention;Problem solving                   Current Attention Level: Sustained         Problem Solving: Decreased initiation;Requires verbal cues General Comments: Pt more confused today; suspect pain medication playing a role. Perseverating on wanting to go home and asking "what are they doing to me?" Internally distracted by pain. Unable to specify deficits.      Exercises Total Joint Exercises Quad Sets: Both;10 reps;Seated Heel Slides: Left;5 reps;AAROM;Supine Hip ABduction/ADduction: Left;10 reps;Supine    General Comments        Pertinent Vitals/Pain Pain Assessment: Faces Faces Pain Scale: Hurts even more Pain Location: L hip Pain Descriptors / Indicators: Sore;Grimacing;Guarding Pain Intervention(s): Limited activity within patient's tolerance;Monitored during  session;Premedicated before session;Repositioned    Home Living                      Prior Function            PT Goals (current goals can now be found in the care plan section) Acute Rehab PT Goals Patient Stated Goal:  to go home Potential to Achieve Goals: Good    Frequency    7X/week      PT Plan Current plan remains appropriate    Co-evaluation              AM-PAC PT "6 Clicks" Mobility   Outcome Measure  Help needed turning from your back to your side while in a flat bed without using bedrails?: None Help needed moving from lying on your back to sitting on the side of a flat bed without using bedrails?: A Little Help needed moving to and from a bed to a chair (including a wheelchair)?: A Little Help needed standing up from a chair using your arms (e.g., wheelchair or bedside chair)?: A Little Help needed to walk in hospital room?: A Little Help needed climbing 3-5 steps with a railing? : A Lot 6 Click Score: 18    End of Session Equipment Utilized During Treatment: Gait belt Activity Tolerance: Patient limited by pain Patient left: in chair;with call bell/phone within reach;with chair alarm set Nurse Communication: Mobility status PT Visit Diagnosis: History of falling (Z91.81);Muscle weakness (generalized) (M62.81);Other abnormalities of gait and mobility (R26.89);Difficulty in walking, not elsewhere classified (R26.2);Pain Pain - Right/Left: Left Pain - part of body: Hip     Time: [id number] PT Time Calculation (min) (ACUTE ONLY): 35 min  Charges:  $Gait Training: 8-22 mins $Therapeutic Exercise: 8-22 mins                       Wyona Almas, PT, DPT Acute Rehabilitation Services Pager [phone] Office [phone]    Deno Etienne 10/04/2019, 2:47 PM

## 2019-10-04 NOTE — Care Plan (Signed)
Ortho bundle CM from office visited patient today in her room with daughter Michaela Simpson) present at bedside. Patient was receiving blood transfusion during visit. Daughter informed she was confused this afternoon, very pale, and emotional. During 30+ minute conversation with daughter and patient, she did become more talkative and working on eating and drinking. Discussed options related to discharge (home with HHPT and in-home paid caregivers that were in place prior to hospitalization) vs. SNF for short term rehab. Patient adamantly states she wants to go home and has CGs in the home that can assist. Informed we will wait for PT session this afternoon and tomorrow and she how she feels after finishing blood transfusion. She states she isn't sleeping or eating and is frustrated and emotional. CM updated MD today and recommendation is to proceed with consult for SW for possibly SNF placement in case this is needed and patient's family still feel in best interest to proceed with SNF instead of home. Still not ruling out home with HHPT however. Please call with questions related to d/c plan. Jamse Arn, RN, BSN, [place] [phone].

## 2019-10-04 NOTE — Progress Notes (Signed)
Patient ID: Michaela Simpson, female   DOB: 1932/01/01, 86 y.o.   MRN: [id number] The patient's family feels that she would benefit from convalescence and recovery in a skilled nursing facility short-term while she recovers from her hip surgery.  We will consult the transition care team for skilled nursing placement and order a new COVID-19 test.

## 2019-10-04 NOTE — Progress Notes (Signed)
OT Cancellation Note  Patient Details Name: DYLIN KNOST MRN: [id number] DOB: Sep 18, 1931   Cancelled Treatment:     Attempted, however, pt not feeling well and receiving blood.  Will reattempt as schedule allows.  Nilsa Nutting., OTR/L Acute Rehabilitation Services Pager [phone] Office [phone]   Lucille Passy M 10/04/2019, 5:02 PM

## 2019-10-05 LAB — CBC
HCT: 33.4 % — ABNORMAL LOW (ref 36.0–46.0)
Hemoglobin: 10.4 g/dL — ABNORMAL LOW (ref 12.0–15.0)
MCH: 27.7 pg (ref 26.0–34.0)
MCHC: 31.1 g/dL (ref 30.0–36.0)
MCV: 88.8 fL (ref 80.0–100.0)
Platelets: 347 10*3/uL (ref 150–400)
RBC: 3.76 MIL/uL — ABNORMAL LOW (ref 3.87–5.11)
RDW: 15.5 % (ref 11.5–15.5)
WBC: 13 10*3/uL — ABNORMAL HIGH (ref 4.0–10.5)
nRBC: 0 % (ref 0.0–0.2)

## 2019-10-05 LAB — BPAM RBC
Blood Product Expiration Date: 202105082359
ISSUE DATE / TIME: 202104151229
Unit Type and Rh: 5100

## 2019-10-05 LAB — TYPE AND SCREEN
ABO/RH(D): O POS
Antibody Screen: NEGATIVE
Unit division: 0

## 2019-10-05 NOTE — Progress Notes (Addendum)
Physical Therapy Treatment Patient Details Name: Michaela Simpson MRN: [id number] DOB: 10-22-1931 Today's Date: 10/05/2019    History of Present Illness Pt is a 86 y.o. female with PMH of TAVR, HTN, RA, R hip fx (2016), L humerus fx (2020), L femoral neck fx (2020), recent pelvic fx (08/2019), who presents 10/02/19 with osteonecrosis wtih post traumatic arthritis following cannulated screw fixation of femoral neck fx in 2020. Now s/p L THA (direct anterior approprach) and removal of cannulated screws 10/02/2019.   PT Comments    Pt progressing with mobility. Able to increase ambulation distance with RW and intermittent min guard; performing simple standing upper body ADL tasks with min guard for balance. This morning, pt limited by pain and upset about wanting to return home. Pt could certainly benefit from SNF-level therapies to maximize functional mobility and independence, but adamant about return home and will have 24/7 assist from caregivers as she had PTA. Therefore, continue to recommend follow-up with HHPT services. Will plan for stair training next session.    Follow Up Recommendations  Home health PT;Supervision/Assistance - 24 hour     Equipment Recommendations  None recommended by PT    Recommendations for Other Services       Precautions / Restrictions Precautions Precautions: Fall Restrictions Weight Bearing Restrictions: Yes LLE Weight Bearing: Weight bearing as tolerated    Mobility  Bed Mobility Overal bed mobility: Modified Independent Bed Mobility: Supine to Sit     Supine to sit: Modified independent (Device/Increase time);HOB elevated     General bed mobility comments: Increased time and effort secondary to LLE pain/stiffness; mod indep with HOB elevated  Transfers Overall transfer level: Needs assistance Equipment used: Rolling walker (2 wheeled) Transfers: Sit to/from Stand Sit to Stand: Min guard         General transfer comment: Able to stand  from EOB and BSC to RW with min guard, good hand placement without cues; increased time and effort  Ambulation/Gait Ambulation/Gait assistance: Min guard;Supervision Gait Distance (Feet): 66 Feet Assistive device: Rolling walker (2 wheeled) Gait Pattern/deviations: Step-through pattern;Decreased stride length;Antalgic;Decreased stance time - left;Trunk flexed Gait velocity: Decreased Gait velocity interpretation: <1.31 ft/sec, indicative of household ambulator General Gait Details: Slow, antalgic gait with RW and intermittent min guard for safety; no overt instability or LOB; heavy reliance on BUE RW support when taking steps. Trialled leading with RLE vs. LLE, pt reports no difference in pain levels   Stairs Stairs: (plan for step training next session)           Wheelchair Mobility    Modified Rankin (Stroke Patients Only)       Balance Overall balance assessment: Needs assistance Sitting-balance support: No upper extremity supported Sitting balance-Leahy Scale: Good       Standing balance-Leahy Scale: Fair Standing balance comment: Can static stand without UE support to pull up underwear from mid-thigh level, close min guard                            Cognition Arousal/Alertness: Awake/alert Behavior During Therapy: WFL for tasks assessed/performed Overall Cognitive Status: Impaired/Different from baseline Area of Impairment: Attention                   Current Attention Level: Sustained;Selective           General Comments: Pt does not seem confused this morning, although distracted by pain and wanting to go home; cognition not formally assessed. Pt tearful  and visibly upset, willing to participate. When asked questions about mobility, pt states, "I don't care... I just want to go home..."      Exercises      General Comments General comments (skin integrity, edema, etc.): SpO2 100% on RA, HR 70-80s      Pertinent Vitals/Pain Pain  Assessment: Faces Faces Pain Scale: Hurts even more Pain Location: L hip Pain Descriptors / Indicators: Sore;Grimacing;Guarding Pain Intervention(s): Monitored during session;Repositioned    Home Living                      Prior Function            PT Goals (current goals can now be found in the care plan section) Acute Rehab PT Goals Patient Stated Goal: "I just want to go home" PT Goal Formulation: With patient Time For Goal Achievement: 10/17/19 Potential to Achieve Goals: Good Progress towards PT goals: Progressing toward goals    Frequency    7X/week      PT Plan Current plan remains appropriate    Co-evaluation              AM-PAC PT "6 Clicks" Mobility   Outcome Measure  Help needed turning from your back to your side while in a flat bed without using bedrails?: None Help needed moving from lying on your back to sitting on the side of a flat bed without using bedrails?: A Little Help needed moving to and from a bed to a chair (including a wheelchair)?: A Little Help needed standing up from a chair using your arms (e.g., wheelchair or bedside chair)?: A Little Help needed to walk in hospital room?: A Little Help needed climbing 3-5 steps with a railing? : A Lot 6 Click Score: 18    End of Session Equipment Utilized During Treatment: Gait belt Activity Tolerance: Patient tolerated treatment well;Patient limited by pain Patient left: in chair;with call bell/phone within reach;with chair alarm set Nurse Communication: Mobility status PT Visit Diagnosis: History of falling (Z91.81);Muscle weakness (generalized) (M62.81);Other abnormalities of gait and mobility (R26.89);Difficulty in walking, not elsewhere classified (R26.2);Pain Pain - Right/Left: Left Pain - part of body: Hip     Time: [id number] PT Time Calculation (min) (ACUTE ONLY): 24 min  Charges:  $Gait Training: 8-22 mins $Therapeutic Activity: 8-22 mins                    Mabeline Caras, PT, DPT Acute Rehabilitation Services  Pager [phone] Office [phone]  Derry Lory 10/05/2019, 9:34 AM

## 2019-10-05 NOTE — Progress Notes (Signed)
Physical Therapy Treatment Patient Details Name: Michaela Simpson MRN: [id number] DOB: 1932-02-09 Today's Date: 10/05/2019    History of Present Illness Pt is a 86 y.o. female with PMH of TAVR, HTN, RA, R hip fx (2016), L humerus fx (2020), L femoral neck fx (2020), recent pelvic fx (08/2019), who presents 10/02/19 with osteonecrosis wtih post traumatic arthritis following cannulated screw fixation of femoral neck fx in 2020. Now s/p L THA (direct anterior approprach) and removal of cannulated screws 10/02/2019.   PT Comments    Pt continuing to progress well with mobility during this afternoon session. Able to perform hallway ambulation and multiple bouts of stair training with RW and min guard. Daughter present during session. Educ re: precautions, positioning, therex and activity recommendations. Discussed recommendations for HHPT services. Sounds like family now on board with [facility] and just needs to know d/c date so caregivers can be arranged.    Follow Up Recommendations  Home health PT;Supervision/Assistance - 24 hour     Equipment Recommendations  None recommended by PT    Recommendations for Other Services       Precautions / Restrictions Precautions Precautions: Fall Restrictions Weight Bearing Restrictions: Yes LLE Weight Bearing: Weight bearing as tolerated    Mobility  Bed Mobility Overal bed mobility: Modified Independent Bed Mobility: Supine to Sit     Supine to sit: Modified independent (Device/Increase time);HOB elevated     General bed mobility comments: Increased time and effort secondary to LLE pain/stiffness; mod indep with HOB elevated  Transfers Overall transfer level: Needs assistance Equipment used: Rolling walker (2 wheeled) Transfers: Sit to/from Stand Sit to Stand: Min guard         General transfer comment: Stood multiple times from EOB and recliner to RW with min guard for safety; good hand placement without  cues  Ambulation/Gait Ambulation/Gait assistance: Min Gaffer (Feet): 130 Feet Assistive device: Rolling walker (2 wheeled) Gait Pattern/deviations: Step-through pattern;Decreased stride length;Antalgic;Decreased stance time - left Gait velocity: Decreased Gait velocity interpretation: <1.31 ft/sec, indicative of household ambulator General Gait Details: Slow, antalgic gait with RW and intermittent min guard for safety; no overt instability or LOB. Pt reports LLE pain improving while walking   Stairs Stairs: Yes Stairs assistance: Min guard Stair Management: One rail Left;Step to pattern;Sideways Number of Stairs: 2 General stair comments: Pt supposedly had one step into home, therefore trialled platform step ascending backwards with RW, pt performed with min guard. Daughter then specified pt has 2 steps with bilateral rails into home; pt able to ascend/descend 2 steps with BUE support on L-side rail; educ on technique; daughter present   Wheelchair Mobility    Modified Rankin (Stroke Patients Only)       Balance Overall balance assessment: Needs assistance Sitting-balance support: No upper extremity supported Sitting balance-Leahy Scale: Good     Standing balance support: During functional activity;No upper extremity supported Standing balance-Leahy Scale: Fair Standing balance comment: Can static stand without UE support                            Cognition Arousal/Alertness: Awake/alert Behavior During Therapy: WFL for tasks assessed/performed;Flat affect Overall Cognitive Status: Impaired/Different from baseline Area of Impairment: Attention;Memory                   Current Attention Level: Selective Memory: Decreased short-term memory       Problem Solving: Requires verbal cues General Comments: Pt seems to  be in better spirits this afternoon; remains soft-spoken and flat at times, but very pleasant and agreeable to  participate. "I'll do whatever I need to do to go home"      Exercises General Exercises - Lower Extremity Long Arc Quad: AROM;Both;Seated Hip Flexion/Marching: AROM;Both;Seated Toe Raises: AROM;Both;Seated Heel Raises: AROM;Both;Seated    General Comments General comments (skin integrity, edema, etc.): Daughter present to discuss HHPT vs. SNF; all questions and concerns answered, including precautions, LE therex, positioning, activity recommendations      Pertinent Vitals/Pain Pain Assessment: Faces Faces Pain Scale: Hurts little more Pain Location: L hip Pain Descriptors / Indicators: Sore;Grimacing;Guarding Pain Intervention(s): Monitored during session;Repositioned    Home Living                      Prior Function            PT Goals (current goals can now be found in the care plan section) Progress towards PT goals: Progressing toward goals    Frequency    7X/week      PT Plan Current plan remains appropriate    Co-evaluation              AM-PAC PT "6 Clicks" Mobility   Outcome Measure  Help needed turning from your back to your side while in a flat bed without using bedrails?: None Help needed moving from lying on your back to sitting on the side of a flat bed without using bedrails?: None Help needed moving to and from a bed to a chair (including a wheelchair)?: A Little Help needed standing up from a chair using your arms (e.g., wheelchair or bedside chair)?: A Little Help needed to walk in hospital room?: A Little Help needed climbing 3-5 steps with a railing? : A Little 6 Click Score: 20    End of Session Equipment Utilized During Treatment: Gait belt Activity Tolerance: Patient tolerated treatment well Patient left: in chair;with call bell/phone within reach;with chair alarm set;with family/visitor present Nurse Communication: Mobility status PT Visit Diagnosis: History of falling (Z91.81);Muscle weakness (generalized)  (M62.81);Other abnormalities of gait and mobility (R26.89);Difficulty in walking, not elsewhere classified (R26.2);Pain Pain - Right/Left: Left Pain - part of body: Hip     Time: 1405-1450 PT Time Calculation (min) (ACUTE ONLY): 45 min  Charges:  $Gait Training: 23-37 mins $Self Care/Home Management: 8-22                    Mabeline Caras, PT, DPT Acute Rehabilitation Services  Pager [phone] Office [place] 10/05/2019, 3:31 PM

## 2019-10-05 NOTE — Progress Notes (Signed)
OT Cancellation Note  Patient Details Name: Michaela Simpson MRN: [id number] DOB: 1932/04/11   Cancelled Treatment:    Reason Eval/Treat Not Completed: Visitor requests OT return a little later as she finishes visiting with pt.  Will reattempt.  Michaela Simpson., OTR/L Acute Rehabilitation Services Pager [phone] Office [phone]   Lucille Passy M 10/05/2019, 3:21 PM

## 2019-10-05 NOTE — Evaluation (Signed)
Occupational Therapy Evaluation Patient Details Name: Michaela Simpson MRN: [id number] DOB: [birth year] Today's Date: 10/05/2019    History of Present Illness Pt is a 86 y.o. female with PMH of TAVR, HTN, RA, R hip fx (2016), L humerus fx (2020), L femoral neck fx (2020), recent pelvic fx (08/2019), who presents 10/02/19 with osteonecrosis wtih post traumatic arthritis following cannulated screw fixation of femoral neck fx in 2020. Now s/p L THA (direct anterior approprach) and removal of cannulated screws 10/02/2019.   Clinical Impression   Pt admitted with above. She demonstrates the below listed deficits and will benefit from continued OT to maximize safety and independence with BADLs.  Pt is able to perform ADLs with set up assist - mod A, and min guard assist for functional transfers.  She was very tearful throughout session due to loss of independence, frustration with current situation, pain, and overall fatigue - encouragement provided.  She lived alone, but has had 24 hour assist since last fall.  She is determined she wants to discharge home.  Will follow acutely.       Follow Up Recommendations  Home health OT;Supervision/Assistance - 24 hour    Equipment Recommendations  None recommended by OT    Recommendations for Other Services       Precautions / Restrictions Precautions Precautions: Fall Restrictions Weight Bearing Restrictions: Yes LUE Weight Bearing: Weight bearing as tolerated RLE Weight Bearing: Weight bearing as tolerated LLE Weight Bearing: Weight bearing as tolerated      Mobility Bed Mobility Overal bed mobility: Modified Independent Bed Mobility: Supine to Sit     Supine to sit: Modified independent (Device/Increase time);HOB elevated     General bed mobility comments: pt up in chair   Transfers Overall transfer level: Needs assistance Equipment used: Rolling walker (2 wheeled) Transfers: Sit to/from [facility] Sit to Stand:  Min guard Stand pivot transfers: Min guard       General transfer comment: min guard for safety while standing for ADLs     Balance Overall balance assessment: Needs assistance Sitting-balance support: No upper extremity supported Sitting balance-Leahy Scale: Good     Standing balance support: No upper extremity supported;During functional activity Standing balance-Leahy Scale: Fair Standing balance comment: can maintain static standing with min guard assist                            ADL either performed or assessed with clinical judgement   ADL Overall ADL's : Needs assistance/impaired Eating/Feeding: Independent   Grooming: Wash/dry hands;Wash/dry face;Oral care;Brushing hair;Min guard;Standing   Upper Body Bathing: Set up;Sitting   Lower Body Bathing: Minimal assistance;Sit to/from stand Lower Body Bathing Details (indicate cue type and reason): requires assist for feet  Upper Body Dressing : Set up;Sitting   Lower Body Dressing: Moderate assistance;Sit to/from stand Lower Body Dressing Details (indicate cue type and reason): requires assist to access feet - able to reach ankles by bending forward  Toilet Transfer: Minimal assistance;Ambulation;Comfort height toilet;RW;Grab bars Toilet Transfer Details (indicate cue type and reason): assist to move sit to stand          Functional mobility during ADLs: Min guard;Rolling walker       Vision Baseline Vision/History: Wears glasses Wears Glasses: At all times Patient Visual Report: No change from baseline       Perception     Praxis      Pertinent Vitals/Pain Pain Assessment: Faces Faces Pain Scale: Hurts  little more Pain Location: L hip Pain Descriptors / Indicators: Sore;Grimacing;Guarding Pain Intervention(s): Monitored during session;Repositioned     Hand Dominance Right   Extremity/Trunk Assessment Upper Extremity Assessment Upper Extremity Assessment: Generalized weakness(arthritic  deformity bil. hands )   Lower Extremity Assessment Lower Extremity Assessment: Defer to PT evaluation   Cervical / Trunk Assessment Cervical / Trunk Assessment: Kyphotic   Communication Communication Communication: No difficulties   Cognition Arousal/Alertness: Awake/alert Behavior During Therapy: WFL for tasks assessed/performed;Flat affect Overall Cognitive Status: Impaired/Different from baseline Area of Impairment: Memory;Attention                   Current Attention Level: Selective Memory: Decreased short-term memory       Problem Solving: Requires verbal cues;Requires tactile cues General Comments: Pt very tearful throughout session citing loss of independence, pain, and feeling tired overall    General Comments  encouragement provided throughout session     Exercises General Exercises - Lower Extremity Long Arc Quad: AROM;Both;Seated Hip Flexion/Marching: AROM;Both;Seated Toe Raises: AROM;Both;Seated Heel Raises: AROM;Both;Seated   Shoulder Instructions      Home Living Family/patient expects to be discharged to:: Private residence Living Arrangements: Alone Available Help at Discharge: Family;Friend(s);Available 24 hours/day;Personal care attendant Type of Home: House Home Access: Stairs to enter [employer] of Steps: 1 Entrance Stairs-Rails: Can reach both Home Layout: One level     Bathroom Shower/Tub: Occupational psychologist: Standard Bathroom Accessibility: No   Home Equipment: Environmental consultant - 2 wheels;Cane - single point;Wheelchair - manual;Toilet riser   Additional Comments: daughter confirms that pt will have 24 hour assist at discharge       Prior Functioning/Environment Level of Independence: Needs assistance  Gait / Transfers Assistance Needed: using walker ADL's / Homemaking Assistance Needed: since prior admission, has been requiring assist with ADL's from caregiver            OT Problem List: Decreased  strength;Impaired balance (sitting and/or standing);Decreased safety awareness;Decreased knowledge of use of DME or AE;Pain;Decreased activity tolerance      OT Treatment/Interventions: Self-care/ADL training;DME and/or AE instruction;Therapeutic activities;Patient/family education;Balance training    OT Goals(Current goals can be found in the care plan section) Acute Rehab OT Goals Patient Stated Goal: to go home  OT Goal Formulation: With patient Time For Goal Achievement: 10/19/19 ADL Goals Pt Will Perform Grooming: with supervision;standing Pt Will Perform Lower Body Bathing: with supervision;sit to/from stand;with adaptive equipment Pt Will Perform Lower Body Dressing: with supervision;with adaptive equipment;sit to/from stand Pt Will Transfer to Toilet: with supervision;ambulating;regular height toilet;bedside commode;grab bars Pt Will Perform Toileting - Clothing Manipulation and hygiene: with supervision;sit to/from stand Pt Will Perform Tub/Shower Transfer: Tub transfer;with supervision;ambulating;tub bench;shower seat;rolling walker  OT Frequency: Min 2X/week   Barriers to D/C:            Co-evaluation              AM-PAC OT "6 Clicks" Daily Activity     Outcome Measure Help from another person eating meals?: None Help from another person taking care of personal grooming?: A Little Help from another person toileting, which includes using toliet, bedpan, or urinal?: A Little Help from another person bathing (including washing, rinsing, drying)?: A Little Help from another person to put on and taking off regular upper body clothing?: A Little Help from another person to put on and taking off regular lower body clothing?: A Lot 6 Click Score: 18   End of Session Equipment Utilized During  Treatment: Rolling walker Nurse Communication: Mobility status  Activity Tolerance: Patient tolerated treatment well Patient left: in chair;with call bell/phone within reach;with  chair alarm set  OT Visit Diagnosis: Unsteadiness on feet (R26.81)                Time: [id number] OT Time Calculation (min): 40 min Charges:  OT General Charges $OT Visit: 1 Visit OT Evaluation $OT Eval Moderate Complexity: 1 Mod OT Treatments $Self Care/Home Management : 23-37 mins  Nilsa Nutting., OTR/L Acute Rehabilitation Services Pager [phone] Office [place], [place] 10/05/2019, 4:47 PM

## 2019-10-05 NOTE — Progress Notes (Signed)
Patient ID: Michaela Simpson, female   DOB: 1931/10/22, 86 y.o.   MRN: [id number] The patient did receive 1 unit of packed red blood cells yesterday.  A repeat CBC was ordered for this morning but has not been done for back yet.  Her left hip is stable.  She is making progress with therapy.  The family has requested that the transitional care team be involved for short-term skilled nursing placement.  I have submitted order for consultation for the transition care team to look into skilled nursing placement.

## 2019-10-05 NOTE — Progress Notes (Signed)
Initial Nutrition Assessment  DOCUMENTATION CODES:   Severe malnutrition in context of chronic illness  INTERVENTION:   -MVI with minerals daily -Continue Ensure Enlive po TID, each supplement provides 350 kcal and 20 grams of protein  NUTRITION DIAGNOSIS:   Severe Malnutrition related to chronic illness(PVD) as evidenced by moderate fat depletion, severe fat depletion, severe muscle depletion, moderate muscle depletion.  GOAL:   Patient will meet greater than or equal to 90% of their needs  MONITOR:   PO intake, Supplement acceptance, Labs, Weight trends, Skin, I & O's  REASON FOR ASSESSMENT:   Malnutrition Screening Tool    ASSESSMENT:   Michaela Simpson, 86 y.o. female, has a history of pain and functional disability in the left hip(s) due to trauma and patient has failed non-surgical conservative treatments for greater than 12 weeks to include NSAID's and/or analgesics, corticosteriod injections, flexibility and strengthening excercises, supervised PT with diminished ADL's post treatment, use of assistive devices and activity modification.  Onset of symptoms was gradual starting 1 years ago with gradually worsening course since that time.The patient noted prior procedures of the hip to include cannulated screw fixation of a left hip femoral neck fracture on the left hip(s).  Patient currently rates pain in the left hip at 10 out of 10 with activity. Patient has night pain, worsening of pain with activity and weight bearing, trendelenberg gait, pain that interfers with activities of daily living and pain with passive range of motion. Patient has evidence of subchondral cysts and joint space narrowing by imaging studies. This condition presents safety issues increasing the risk of falls.  There is no current active infection.  Pt admitted with lt hip pain.  4/13- s/p PROCEDURES: 1.  Removal of deep buried implants (cannulated screws), left hip. 2.  Left total hip  arthroplasty through direct anterior approach.  Reviewed I/O's: +2.1 L x 24 hours and +5.2 L since admission  Pt not very interactive at time of visit, due to significant pain. She reports just receiving pain medication prior to RD visit. Pt familiar to this RD from prior admission earlier this month; she had been experiencing a general decline in health over the past several month since developing a wound infection on her leg after accidentally puncturing her leg with gardening shears (she was being followed by the wound center). She was consuming 3 Ensures daily PTA per their direction.   Pt reports she has no appetite. Noted lunch meal tray at bedside, unattempted. However, pt did consume all of her Ensure supplement. Noted meal completion 25-75%.  Reviewed wt hx; wt has been stable.  Per MD notes, plan discharge to SNF vs home with home health.   Medications reviewed and include 0.9% sodium chloride infusion @ 75 ml/hr.   Labs reviewed.   NUTRITION - FOCUSED PHYSICAL EXAM:    Most Recent Value  Orbital Region  Severe depletion  Upper Arm Region  Severe depletion  Thoracic and Lumbar Region  Unable to assess  Buccal Region  Moderate depletion  Temple Region  Severe depletion  Clavicle Bone Region  Severe depletion  Clavicle and Acromion Bone Region  Severe depletion  Scapular Bone Region  Severe depletion  Dorsal Hand  Severe depletion  Patellar Region  Severe depletion  Anterior Thigh Region  Severe depletion  Posterior Calf Region  Severe depletion  Edema (RD Assessment)  None  Hair  Reviewed  Eyes  Reviewed  Mouth  Reviewed  Skin  Reviewed  Nails  Reviewed  Diet Order:   Diet Order            Diet regular Room service appropriate? No; Fluid consistency: Thin  Diet effective now              EDUCATION NEEDS:   Not appropriate for education at this time  Skin:  Skin Assessment: Skin Integrity Issues: Skin Integrity Issues:: Incisions Incisions: closed  lt hip Other: -  Last BM:  10/04/19  Height:   Ht Readings from Last 1 Encounters:  09/26/19 5' (1.524 m)    Weight:   Wt Readings from Last 1 Encounters:  09/26/19 47.2 kg    Ideal Body Weight:  45.5 kg  BMI:  Estimated body mass index is 20.31 kg/m as calculated from the following:   Height as of 09/26/19: 5' (1.524 m).   Weight as of 09/26/19: 47.2 kg.  Estimated Nutritional Needs:   Kcal:  1450-1650  Protein:  70-85 grams  Fluid:  > 1.4 L    [name], RD, LDN, [place] Registered Dietitian II Certified Diabetes Care and Education Specialist Please refer to [facility] for RD and/or RD on-call/weekend/after hours pager

## 2019-10-05 NOTE — Progress Notes (Signed)
Patient ID: Michaela Simpson, female   DOB: 1932-06-12, 86 y.o.   MRN: [id number] The patient's vital signs are stable.  Her H&H rebounded nicely after just 1 unit of blood.  Physical therapy is working with her right now at the bedside.  They feel confident that she is going to be able to do well in a home environment.  I still sense that the family is hesitant for her to go home but they do say they will leave it up to our expertise.  Her left hip is otherwise stable.  I do feel that she would do well in the home environment.  Given the fact that it is Friday afternoon it will be nice to maximize therapy here at least 1 more day or so before sending her home is opposed to sending her to skilled nursing.

## 2019-10-06 MED ORDER — HYDROCODONE-ACETAMINOPHEN 5-325 MG PO TABS: 1.0000 | ORAL_TABLET | Freq: Four times a day (QID) | ORAL | 0 refills | Status: DC | PRN

## 2019-10-06 NOTE — Progress Notes (Signed)
Patient ID: Michaela Simpson, female   DOB: 01-Feb-1932, 86 y.o.   MRN: [id number] I just spoke with therapy today.  The patient did very well with her mobility with therapy.  Her vital signs are stable.  I changed her left hip dressing at the bedside and looks great.  I do feel that she is slipping into some depression because she wants to be home.  She states that she is doing off 1 week but I do feel that getting back to her home environment is going to be much more helpful to her.  I did speak to her daughter on the phone.  I conveyed this as well.  Therapy is felt very good with her mobility and with her being able to get home.  My plan will be for her being discharged to home tomorrow.  Family does agree with this plan.

## 2019-10-06 NOTE — Progress Notes (Signed)
Physical Therapy Treatment Patient Details Name: Michaela Simpson MRN: [id number] DOB: 02-Feb-1932 Today's Date: 10/06/2019    History of Present Illness Pt is a 86 y.o. female with PMH of TAVR, HTN, RA, R hip fx (2016), L humerus fx (2020), L femoral neck fx (2020), recent pelvic fx (08/2019), who presents 10/02/19 with osteonecrosis wtih post traumatic arthritis following cannulated screw fixation of femoral neck fx in 2020. Now s/p L THA (direct anterior approprach) and removal of cannulated screws 10/02/2019.    PT Comments    Patient progressing with mobility.  Currently supervision for ambulation and mobility.  Per chart, patient has assistance at home (24 hour personal care attendants).  Feel patient safe to discharge home from PT standpoint with assistance.  Patient making comments about being "lonely" and how the hospital is "depressing".  Feel patient will do much better in her home environment.  I do recommend HHPT to continue to progress patient to independent mobility.     Follow Up Recommendations  Home health PT;Supervision/Assistance - 24 hour     Equipment Recommendations  None recommended by PT    Recommendations for Other Services       Precautions / Restrictions Precautions Precautions: Fall Restrictions Weight Bearing Restrictions: Yes LLE Weight Bearing: Weight bearing as tolerated    Mobility  Bed Mobility Overal bed mobility: (patient in recliner upon arrival)                Transfers Overall transfer level: Needs assistance Equipment used: Rolling walker (2 wheeled) Transfers: Sit to/from Stand Sit to Stand: Supervision         General transfer comment: supervision for safety  Ambulation/Gait Ambulation/Gait assistance: Supervision Gait Distance (Feet): 150 Feet Assistive device: Rolling walker (2 wheeled) Gait Pattern/deviations: Step-through pattern;Decreased stride length;Antalgic;Decreased stance time - left Gait velocity:  Decreased   General Gait Details: Slow gait with RW and supervision for safety; no overt instability or LOB.   Stairs Stairs: Yes Stairs assistance: Min guard Stair Management: Two rails;Step to pattern;Forwards Number of Stairs: 2     Wheelchair Mobility    Modified Rankin (Stroke Patients Only)       Balance Overall balance assessment: Needs assistance Sitting-balance support: No upper extremity supported Sitting balance-Leahy Scale: Good     Standing balance support: During functional activity   Standing balance comment: used RW for balance during gait                            Cognition Arousal/Alertness: Awake/alert Behavior During Therapy: WFL for tasks assessed/performed;Flat affect Overall Cognitive Status: Within Functional Limits for tasks assessed                                        Exercises Total Joint Exercises Ankle Circles/Pumps: Both;10 reps;Seated Quad Sets: Both;10 reps;Seated Gluteal Sets: Both;10 reps;Seated Long Arc Quad: 10 reps;Both;Seated    General Comments        Pertinent Vitals/Pain Pain Assessment: 0-10 Faces Pain Scale: Hurts little more Pain Location: L hip Pain Descriptors / Indicators: Sore;Guarding Pain Intervention(s): Limited activity within patient's tolerance;Monitored during session    Home Living                      Prior Function            PT Goals (current goals  can now be found in the care plan section) Progress towards PT goals: Progressing toward goals    Frequency    7X/week      PT Plan Current plan remains appropriate    Co-evaluation              AM-PAC PT "6 Clicks" Mobility   Outcome Measure  Help needed turning from your back to your side while in a flat bed without using bedrails?: A Little Help needed moving from lying on your back to sitting on the side of a flat bed without using bedrails?: A Little Help needed moving to and from a  bed to a chair (including a wheelchair)?: A Little Help needed standing up from a chair using your arms (e.g., wheelchair or bedside chair)?: A Little Help needed to walk in hospital room?: A Little Help needed climbing 3-5 steps with a railing? : A Little 6 Click Score: 18    End of Session Equipment Utilized During Treatment: Gait belt Activity Tolerance: Patient tolerated treatment well Patient left: in chair;with call bell/phone within reach   PT Visit Diagnosis: History of falling (Z91.81);Muscle weakness (generalized) (M62.81);Other abnormalities of gait and mobility (R26.89);Difficulty in walking, not elsewhere classified (R26.2);Pain Pain - Right/Left: Left Pain - part of body: Hip     Time: [id number] PT Time Calculation (min) (ACUTE ONLY): 24 min  Charges:  $Gait Training: 8-22 mins $Therapeutic Exercise: 8-22 mins                     10/06/2019 Margie, PT Acute Rehabilitation Services Pager:  [phone] Office:  [place] 10/06/2019, 11:00 AM

## 2019-10-06 NOTE — Discharge Instructions (Signed)

## 2019-10-06 NOTE — Progress Notes (Deleted)
Pt refused to allow phlebotomy to draw labs.  RN spoke with patient; could not convince her to allow the draw.

## 2019-10-06 NOTE — Evaluation (Signed)
Occupational Therapy Evaluation Patient Details Name: Michaela Simpson MRN: [id number] DOB: December 08, 1931 Today's Date: 10/06/2019    History of Present Illness Pt is a 86 y.o. female with PMH of TAVR, HTN, RA, R hip fx (2016), L humerus fx (2020), L femoral neck fx (2020), recent pelvic fx (08/2019), who presents 10/02/19 with osteonecrosis wtih post traumatic arthritis following cannulated screw fixation of femoral neck fx in 2020. Now s/p L THA (direct anterior approprach) and removal of cannulated screws 10/02/2019.   Clinical Impression   Pt is making excellent progress.  She is able to perform LB ADLs with min A, and functional mobility with min guard to supervision during ADL tasks.  She was much brighter this pm, and hopeful for discharge home tomorrow.     Follow Up Recommendations  Home health OT;Supervision/Assistance - 24 hour    Equipment Recommendations  None recommended by OT    Recommendations for Other Services       Precautions / Restrictions Precautions Precautions: Fall Restrictions Weight Bearing Restrictions: Yes LUE Weight Bearing: Weight bearing as tolerated RLE Weight Bearing: Weight bearing as tolerated LLE Weight Bearing: Weight bearing as tolerated      Mobility Bed Mobility Overal bed mobility: Needs Assistance Bed Mobility: Supine to Sit     Supine to sit: Min assist     General bed mobility comments: requires min A for Lt LE    Transfers Overall transfer level: Needs assistance Equipment used: Rolling walker (2 wheeled) Transfers: Sit to/from [facility] Sit to Stand: Supervision Stand pivot transfers: Supervision            Balance Overall balance assessment: Needs assistance Sitting-balance support: No upper extremity supported Sitting balance-Leahy Scale: Good     Standing balance support: During functional activity Standing balance-Leahy Scale: Fair                             ADL either  performed or assessed with clinical judgement   ADL Overall ADL's : Needs assistance/impaired     Grooming: Wash/dry hands;Wash/dry face;Oral care;Brushing hair;Standing;Supervision/safety               Lower Body Dressing: Minimal assistance;Sit to/from stand Lower Body Dressing Details (indicate cue type and reason): pt able to don underpants.  requires assist for Lt sock and shoe  Toilet Transfer: Supervision/safety;Ambulation;Comfort height toilet;Grab bars;RW [employer] forces technical officer Details (indicate cue type and reason): verbal cues for walker safety          Functional mobility during ADLs: Supervision/safety;Rolling walker       Vision         Perception     Praxis      Pertinent Vitals/Pain Pain Assessment: Faces Faces Pain Scale: Hurts little more Pain Location: L hip Pain Descriptors / Indicators: Sore;Guarding Pain Intervention(s): Monitored during session     Hand Dominance     Extremity/Trunk Assessment Upper Extremity Assessment Upper Extremity Assessment: Generalized weakness   Lower Extremity Assessment Lower Extremity Assessment: Defer to PT evaluation       Communication     Cognition Arousal/Alertness: Awake/alert Behavior During Therapy: WFL for tasks assessed/performed;Flat affect Overall Cognitive Status: Within Functional Limits for tasks assessed                                 General Comments: Pt much brighter today    General Comments  Exercises     Shoulder Instructions      Home Living                                          Prior Functioning/Environment                   OT Problem List:        OT Treatment/Interventions:      OT Goals(Current goals can be found in the care plan section)    OT Frequency: Min 2X/week   Barriers to D/C:            Co-evaluation              AM-PAC OT "6 Clicks" Daily Activity     Outcome Measure Help from another person  eating meals?: None Help from another person taking care of personal grooming?: A Little Help from another person toileting, which includes using toliet, bedpan, or urinal?: A Little Help from another person bathing (including washing, rinsing, drying)?: A Little Help from another person to put on and taking off regular upper body clothing?: A Little Help from another person to put on and taking off regular lower body clothing?: A Lot 6 Click Score: 18   End of Session Equipment Utilized During Treatment: Rolling walker Nurse Communication: Mobility status  Activity Tolerance: Patient tolerated treatment well Patient left: in chair;with call bell/phone within reach;with chair alarm set  OT Visit Diagnosis: Unsteadiness on feet (R26.81)                Time: [id number] OT Time Calculation (min): 25 min Charges:  OT General Charges $OT Visit: 1 Visit OT Treatments $Self Care/Home Management : 23-37 mins  Nilsa Nutting., OTR/L Acute Rehabilitation Services Pager [phone] Office [phone]   Lucille Passy M 10/06/2019, 5:45 PM

## 2019-10-07 NOTE — Progress Notes (Signed)
Physical Therapy Treatment Patient Details Name: Michaela Simpson MRN: [id number] DOB: 01-09-1932 Today's Date: 10/07/2019    History of Present Illness Pt is a 86 y.o. female with PMH of TAVR, HTN, RA, R hip fx (2016), L humerus fx (2020), L femoral neck fx (2020), recent pelvic fx (08/2019), who presents 10/02/19 with osteonecrosis wtih post traumatic arthritis following cannulated screw fixation of femoral neck fx in 2020. Now s/p L THA (direct anterior approprach) and removal of cannulated screws 10/02/2019.    PT Comments    Continuing work on functional mobility and activity tolerance;  Excellent therapeutic exercises and progressive ambulation; Agree with dc home with HHPT follow up   Follow Up Recommendations  Home health PT;Supervision/Assistance - 24 hour     Equipment Recommendations  None recommended by PT    Recommendations for Other Services       Precautions / Restrictions Precautions Precautions: Fall Restrictions LLE Weight Bearing: Weight bearing as tolerated    Mobility  Bed Mobility               General bed mobility comments: recieved in the chair  Transfers Overall transfer level: Needs assistance Equipment used: Rolling walker (2 wheeled) Transfers: Sit to/from Stand Sit to Stand: Min guard(without physical contact)         General transfer comment: Minguard for safety  Ambulation/Gait Ambulation/Gait assistance: Supervision Gait Distance (Feet): 100 Feet Assistive device: Rolling walker (2 wheeled) Gait Pattern/deviations: Step-through pattern;Decreased stride length;Antalgic;Decreased stance time - left Gait velocity: Decreased   General Gait Details: Slow gait with RW and supervision for safety; no overt instability or LOB.; cues to stand tall on LLE in stance and activate glutes for full hip extension   Stairs             Wheelchair Mobility    Modified Rankin (Stroke Patients Only)       Balance     Sitting  balance-Leahy Scale: Good       Standing balance-Leahy Scale: Fair Standing balance comment: used RW for balance during gait                            Cognition Arousal/Alertness: Awake/alert Behavior During Therapy: WFL for tasks assessed/performed;Flat affect Overall Cognitive Status: Within Functional Limits for tasks assessed                                        Exercises Total Joint Exercises Ankle Circles/Pumps: Both;10 reps;Seated Quad Sets: Both;10 reps;Seated Gluteal Sets: Both;10 reps;Seated Towel Squeeze: AROM;Both;10 reps Heel Slides: AAROM;Left;10 reps Hip ABduction/ADduction: AROM;Left;10 reps    General Comments        Pertinent Vitals/Pain Pain Assessment: Faces Faces Pain Scale: Hurts little more Pain Location: L hip Pain Descriptors / Indicators: Sore;Guarding Pain Intervention(s): Monitored during session    Home Living                      Prior Function            PT Goals (current goals can now be found in the care plan section) Acute Rehab PT Goals Patient Stated Goal: to go home  PT Goal Formulation: With patient Time For Goal Achievement: 10/17/19 Potential to Achieve Goals: Good Progress towards PT goals: Progressing toward goals    Frequency    7X/week  PT Plan Current plan remains appropriate    Co-evaluation              AM-PAC PT "6 Clicks" Mobility   Outcome Measure  Help needed turning from your back to your side while in a flat bed without using bedrails?: A Little Help needed moving from lying on your back to sitting on the side of a flat bed without using bedrails?: A Little Help needed moving to and from a bed to a chair (including a wheelchair)?: A Little Help needed standing up from a chair using your arms (e.g., wheelchair or bedside chair)?: A Little Help needed to walk in hospital room?: None Help needed climbing 3-5 steps with a railing? : A Little 6 Click  Score: 19    End of Session Equipment Utilized During Treatment: Gait belt Activity Tolerance: Patient tolerated treatment well Patient left: in chair;with call bell/phone within reach Nurse Communication: Mobility status PT Visit Diagnosis: History of falling (Z91.81);Muscle weakness (generalized) (M62.81);Other abnormalities of gait and mobility (R26.89);Difficulty in walking, not elsewhere classified (R26.2);Pain Pain - Right/Left: Left Pain - part of body: Hip     Time: [id number] PT Time Calculation (min) (ACUTE ONLY): 23 min  Charges:  $Gait Training: 8-22 mins $Therapeutic Exercise: 8-22 mins                     Roney Marion, PT  Acute Rehabilitation Services Pager [phone] Office [place] 10/07/2019, 1:23 PM

## 2019-10-08 ENCOUNTER — Telehealth: Payer: Self-pay | Admitting: *Deleted

## 2019-10-08 DIAGNOSIS — I739 Peripheral vascular disease, unspecified: Secondary | ICD-10-CM | POA: Diagnosis not present

## 2019-10-08 DIAGNOSIS — F329 Major depressive disorder, single episode, unspecified: Secondary | ICD-10-CM | POA: Diagnosis not present

## 2019-10-08 DIAGNOSIS — M80052D Age-related osteoporosis with current pathological fracture, left femur, subsequent encounter for fracture with routine healing: Secondary | ICD-10-CM | POA: Diagnosis not present

## 2019-10-08 DIAGNOSIS — M800AXD Age-related osteoporosis with current pathological fracture, other site, subsequent encounter for fracture with routine healing: Secondary | ICD-10-CM | POA: Diagnosis not present

## 2019-10-08 DIAGNOSIS — M069 Rheumatoid arthritis, unspecified: Secondary | ICD-10-CM | POA: Diagnosis not present

## 2019-10-08 DIAGNOSIS — I10 Essential (primary) hypertension: Secondary | ICD-10-CM | POA: Diagnosis not present

## 2019-10-08 NOTE — Care Plan (Signed)
RNCM spoke with patient's daughter earlier this morning and confirmed that patient did go home over the weekend on Sunday, 10/07/19 with paid caregivers assisting at her home. She confirmed she is doing very well since being home. Daughter requested appointment be moved to just a day or two later to accommodate her schedule, since she will be bringing her mother to the [facility] for her post-op checks. Appointment rescheduled for Thursday, 10/18/19 at 1:00 pm with Dr. Ninfa Linden. CM attempted to contact patient's home and left VM on home phone requesting call back. Also reached out to [facility] to ensure that therapy ordered will begin this week. Spoke with Queen [place]ough with [facility] who confirmed they were aware of patient's discharge.

## 2019-10-08 NOTE — Telephone Encounter (Signed)
Ortho bundle D/C call completed. 

## 2019-10-09 NOTE — Discharge Summary (Signed)
Patient ID: Michaela Simpson MRN: [id number] DOB/AGE: Dec 28, 1931 86 y.o.  Admit date: 10/02/2019 Discharge date: 10/09/2019  Admission Diagnoses:  Active Problems:   Traumatic arthritis of left hip   Status post total replacement of left hip   Discharge Diagnoses:  Status post left total hip arthroplasty Acute blood loss anemia secondary to surgery  Past Medical History:  Diagnosis Date  . AKI (acute kidney injury) ([place])   . Anemia   . Aortic stenosis   . Bruising    large bruise to left neck and T/O body  . Depression   . Early cataract   . Headache   . Hip fracture ([place])   . HTN (hypertension)   . Humerus fracture   . Leukocytosis   . Osteonecrosis ([place])    post-traumatic osteonecrosis of the femoral head of the left hip, retained orthopedic hardware ( order for conset)  . Osteoporosis   . Peripheral vascular disease ([place])    blood clot right leg  . Rheumatoid arthritis ([place])   . Thoracic ascending aortic aneurysm ([facility])    4.4 cm by 07/26/19 CTA  . Ulcer of lower extremity ([place])   . Wears glasses     Surgeries: Procedure(s): LEFT TOTAL HIP ARTHROPLASTY ANTERIOR APPROACH AND REMOVAL OF CANNULATED SCREWS OF LEFT HIP HARDWARE REMOVAL on 10/02/2019   Consultants:   Discharged Condition: Improved  Hospital Course: Michaela Simpson is an 86 y.o. female who was admitted 10/02/2019 for operative treatment of<principal problem not specified>. Patient has severe unremitting pain that affects sleep, daily activities, and work/hobbies. After pre-op clearance the patient was taken to the operating room on 10/02/2019 and underwent  Procedure(s): LEFT TOTAL HIP ARTHROPLASTY ANTERIOR APPROACH AND REMOVAL OF CANNULATED SCREWS OF LEFT HIP HARDWARE REMOVAL.    Patient was given perioperative antibiotics:  Anti-infectives (From admission, onward)   Start     Dose/Rate Route Frequency Ordered Stop   10/03/19 0600  clindamycin (CLEOCIN) IVPB 900 mg     900 mg 100 mL/hr over 30  Minutes Intravenous On call to O.R. 10/02/19 1245 10/02/19 1532   10/02/19 2130  clindamycin (CLEOCIN) IVPB 600 mg     600 mg 100 mL/hr over 30 Minutes Intravenous Every 6 hours 10/02/19 2102 10/03/19 0254   10/02/19 1252  clindamycin (CLEOCIN) 900 MG/50ML IVPB    Note to Pharmacy: Nyoka Cowden   : cabinet override      10/02/19 1252 10/02/19 1538       Patient was given sequential compression devices, early ambulation, and chemoprophylaxis to prevent DVT.  Patient benefited maximally from hospital stay and there were no complications.   Patient received 1 unit of blood secondary to symptomatic anemia.   Recent vital signs: No data found.   Recent laboratory studies: No results for input(s): WBC, HGB, HCT, PLT, NA, K, CL, CO2, BUN, CREATININE, GLUCOSE, INR, CALCIUM in the last 72 hours.  Invalid input(s): PT, 2   Discharge Medications:   Allergies as of 10/07/2019      Reactions   Penicillins Nausea And Vomiting, Other (See Comments)   Child hood allergy - Flu symptoms; constant vomiting  Did it involve swelling of the face/tongue/throat, SOB, or low BP? Unknown Did it involve sudden or severe rash/hives, skin peeling, or any reaction on the inside of your mouth or nose? Unknown Did you need to seek medical attention at a hospital or doctor's office? Unknown When did it last happen?50+ years If all above answers are "NO", may proceed with  cephalosporin use.   Codeine Nausea And Vomiting, Rash   INTOLERANCE >  VOMITING      Medication List    TAKE these medications   acetaminophen 325 MG tablet Commonly known as: TYLENOL Take 2 tablets (650 mg total) by mouth every 6 (six) hours as needed for mild pain (or Fever >/= 101). Notes to patient: Last given 9:15 am 10/07/19   amLODipine 2.5 MG tablet Commonly known as: NORVASC Take 1 tablet (2.5 mg total) by mouth at bedtime.   aspirin EC 81 MG tablet Take 81 mg by mouth daily.   clindamycin 300 MG capsule Commonly  known as: Cleocin Take 2 capsules (600mg ) 1 hour prior to all dental visits. What changed:   how much to take  how to take this  when to take this   clopidogrel 75 MG tablet Commonly known as: Plavix Take 1 tablet (75 mg total) by mouth daily.   HYDROcodone-acetaminophen 5-325 MG tablet Commonly known as: NORCO/VICODIN Take 1 tablet by mouth every 6 (six) hours as needed for moderate pain or severe pain.   lactose free nutrition Liqd Take 237 mLs by mouth 3 (three) times daily.   LORazepam 0.5 MG tablet Commonly known as: ATIVAN Take 0.5 mg by mouth at bedtime.   Magnesium 400 MG Caps Take 400 mg by mouth daily.   MIRALAX PO Take 17 g by mouth daily.   multivitamin with minerals tablet Take 1 tablet by mouth daily.   OSTEO BI-FLEX ONE PER DAY PO Take 1 tablet by mouth daily.   PROBIOTIC ACIDOPHILUS PO Take 1 tablet by mouth daily.   rosuvastatin 10 MG tablet Commonly known as: Crestor Take 1 tablet (10 mg total) by mouth at bedtime.   sertraline 100 MG tablet Commonly known as: ZOLOFT Take 150 mg by mouth daily.   traMADol 50 MG tablet Commonly known as: ULTRAM Take 1 tablet (50 mg total) by mouth every 6 (six) hours as needed. What changed: reasons to take this   vitamin B-12 1000 MCG tablet Commonly known as: CYANOCOBALAMIN Take 1,000 mcg by mouth daily.   Vitamin D-3 125 MCG (5000 UT) Tabs Take 1 tablet by mouth daily.   Vitamin K2 100 MCG Tabs Take 100 mcg by mouth daily.       Diagnostic Studies: DG Ribs Unilateral W/Chest Left  Result Date: 09/19/2019 CLINICAL DATA:  Fall. Left lower rib pain and bruising EXAM: LEFT RIBS AND CHEST - 3+ VIEW COMPARISON:  09/19/2019 chest radiograph. FINDINGS: Aortic valve prosthesis in place. Stable cardiomediastinal silhouette with top-normal heart size. No pneumothorax. No right pleural effusion. Trace left pleural effusion. No pulmonary edema. Mild left basilar atelectasis. Acute lateral left seventh rib  fracture, nondisplaced. Slight deformity of the lateral left eighth and ninth ribs. No focal osseous lesions. IMPRESSION: 1. Acute lateral left seventh rib fracture. Slight deformity of the lateral left eighth and ninth ribs, which could represent additional acute nondisplaced fractures. 2. Trace left pleural effusion with mild left basilar atelectasis. No pneumothorax. Electronically Signed   By: Ilona Sorrel M.D.   On: 09/19/2019 11:06   CT Head Wo Contrast  Result Date: 09/19/2019 CLINICAL DATA:  86 year old female with history of head trauma from recurrent falls. On blood thinners. EXAM: CT HEAD WITHOUT CONTRAST TECHNIQUE: Contiguous axial images were obtained from the base of the skull through the vertex without intravenous contrast. COMPARISON:  Head CT 03/08/2019. FINDINGS: Brain: Patchy areas of decreased attenuation are noted throughout the deep and periventricular white  matter of the cerebral hemispheres bilaterally, compatible with mild chronic microvascular ischemic disease. No evidence of acute infarction, hemorrhage, hydrocephalus, extra-axial collection or mass lesion/mass effect. Vascular: No hyperdense vessel or unexpected calcification. Skull: Normal. Negative for fracture or focal lesion. Sinuses/Orbits: No acute finding. Other: None. IMPRESSION: 1. No acute displaced skull fracture or signs to suggest significant acute intracranial trauma. 2. Mild chronic microvascular ischemic changes in the cerebral white matter. Electronically Signed   By: Vinnie Langton M.D.   On: 09/19/2019 11:05   CT Chest Wo Contrast  Result Date: 09/19/2019 CLINICAL DATA:  Fall, pain, rib fractures EXAM: CT CHEST WITHOUT CONTRAST TECHNIQUE: Multidetector CT imaging of the chest was performed following the standard protocol without IV contrast. COMPARISON:  Same day radiographs, CT chest angiogram, 07/26/2019 FINDINGS: Cardiovascular: Interval placement of aortic valve stent endograft. Aortic atherosclerosis.  Normal heart size. Left coronary artery calcifications. No pericardial effusion. Mediastinum/Nodes: No enlarged mediastinal, hilar, or axillary lymph nodes. Thyroid gland, trachea, and esophagus demonstrate no significant findings. Lungs/Pleura: Unchanged somewhat nodular, bandlike scarring or atelectasis of the left lower lobe (series 8, image 103). Trace left pleural effusion associated atelectasis or consolidation. Upper Abdomen: No acute abnormality. Musculoskeletal: No chest wall mass or suspicious bone lesions identified. No acute appearing fractures of the left ribs. There are multiple chronic fracture deformities, unchanged in appearance and configuration compared to CT dated 07/26/2019. Disc degenerative disease of the thoracic spine. Unchanged endplate deformities of T12, L1, and L2. Chronic fracture deformity of the proximal left humerus. IMPRESSION: 1. No acute appearing rib fractures. Multiple chronic rib fracture deformities, unchanged compared to CT dated 07/26/2019. 2.  Unchanged vertebral endplate deformities of T12, L1, and L2. 3.  Chronic fracture deformity of the proximal left humerus. 4. Interval placement of aortic valve stent endograft. Aortic Atherosclerosis (ICD10-I70.0). 5.  Coronary artery disease. 6.  Trace left pleural effusion. Electronically Signed   By: Eddie Candle M.D.   On: 09/19/2019 14:43   CT PELVIS WO CONTRAST  Result Date: 09/19/2019 CLINICAL DATA:  Fall, left-sided leg pain EXAM: CT PELVIS WITHOUT CONTRAST TECHNIQUE: Multidetector CT imaging of the pelvis was performed following the standard protocol without intravenous contrast. COMPARISON:  Same day pelvic radiographs, CT chest abdomen pelvis angiogram, 07/26/2019 FINDINGS: Urinary Tract:  No abnormality visualized. Bowel:  Unremarkable visualized pelvic bowel loops. Vascular/Lymphatic: No pathologically enlarged lymph nodes. Partially imaged right superficial femoral artery stent. Reproductive:  No mass or other  significant abnormality Other:  None. Musculoskeletal: Minimally displaced acute fractures of the left sacral ala (series 4, image 30). Minimally displaced acute fractures of the anterior left acetabulum (series 4, image 67). Minimally displaced acute fractures of the left inferior pubic ramus. (Series 4, image 93). Status post screw fixation of the left femoral neck and intramedullary screw and nail fixation of the right femoral neck with unchanged appearance and alignment. Osteopenia. IMPRESSION: 1. Minimally displaced acute fractures of the left sacral ala, anterior left acetabulum, and left inferior pubic ramus. 2. Status post screw fixation of the left femoral neck and intramedullary screw and nail fixation of the right femoral neck with unchanged appearance and alignment. 3.  Osteopenia. Electronically Signed   By: Eddie Candle M.D.   On: 09/19/2019 14:31   DG Pelvis Portable  Result Date: 10/02/2019 CLINICAL DATA:  Left hip arthroplasty EXAM: PORTABLE PELVIS 1-2 VIEWS COMPARISON:  10/02/2019 FINDINGS: Supine frontal view of the pelvis demonstrates left hip arthroplasty in the expected position without signs of acute complication. Chronic postsurgical changes  are seen within the right femur. Vascular stent overlies right inguinal region. Postsurgical changes soft tissues overlying left hip. IMPRESSION: 1. Unremarkable left hip arthroplasty. Electronically Signed   By: Randa Ngo M.D.   On: 10/02/2019 19:48   DG Pelvis Portable  Result Date: 09/19/2019 CLINICAL DATA:  Pain following fall EXAM: PORTABLE PELVIS 1-2 VIEWS COMPARISON:  March 19, 2019 FINDINGS: Postoperative changes noted in each proximal femur, stable. There is evidence of an old healed fracture of the subcapital femoral neck region on the left. There is an old intertrochanteric fracture on the right. No acute fracture or dislocation is demonstrable. There is moderate narrowing of each hip joint. Bones are osteoporotic. There is a  stent on the right extending into the medial right thigh. IMPRESSION: Postoperative changes with evidence of old fractures bilaterally, unchanged in alignment compared to prior study. No acute appearing fracture or dislocation. Bones osteoporotic. Narrowing each hip joint is stable. Electronically Signed   By: Lowella Grip III M.D.   On: 09/19/2019 10:12   DG Chest Portable 1 View  Result Date: 09/19/2019 CLINICAL DATA:  Pain following fall EXAM: PORTABLE CHEST 1 VIEW COMPARISON:  August 07, 2019 FINDINGS: There is a small left pleural effusion. Lungs otherwise are clear. Heart size and pulmonary vascularity are normal. No adenopathy. Aortic valve replacement noted. Apparent old fracture proximal left humerus with remodeling. Healed fracture of the lateral left ninth rib. Acute appearing fracture of the lateral left clavicle noted with slight impaction in this area. IMPRESSION: Acute appearing fracture lateral left clavicle. Old healed fractures at other sites noted. Small left pleural effusion. Lungs elsewhere clear. No pneumothorax. Stable cardiac silhouette. Status post aortic valve replacement. Electronically Signed   By: Lowella Grip III M.D.   On: 09/19/2019 10:14   DG C-Arm 1-60 Min  Result Date: 10/02/2019 CLINICAL DATA:  Left total hip arthroplasty, anterior approach EXAM: OPERATIVE LEFT HIP (WITH PELVIS IF PERFORMED) 2 VIEWS TECHNIQUE: Fluoroscopic spot image(s) were submitted for interpretation post-operatively. COMPARISON:  Femoral radiographs 09/19/2019 FINDINGS: Interval removal of the cannulated transcervical screws of the left femur with subsequent placement of a total left hip arthroplasty including acetabular cup and articulating femoral stem which are in expected alignment. There postsurgical soft tissue changes including soft tissue gas and intra-articular gas at the left hip. No acute complication. Remaining osseous structures are unremarkable. IMPRESSION: Interval placement  of a left total hip arthroplasty without acute complication. Electronically Signed   By: Lovena Le M.D.   On: 10/02/2019 19:17   ABI  w/wo TBI   [id number]  Result Date: 09/10/2019 LOWER EXTREMITY DOPPLER STUDY Indications: Peripheral artery disease. High Risk Factors: Hypertension.  Vascular               Right superficial femoral-popliteal artery stent Interventions:         05/08/2019. Performing Technologist: Ronal Fear RVS, RCS  Examination Guidelines: A complete evaluation includes at minimum, Doppler waveform signals and systolic blood pressure reading at the level of bilateral brachial, anterior tibial, and posterior tibial arteries, when vessel segments are accessible. Bilateral testing is considered an integral part of a complete examination. Photoelectric Plethysmograph (PPG) waveforms and toe systolic pressure readings are included as required and additional duplex testing as needed. Limited examinations for reoccurring indications may be performed as noted.  ABI Findings: +---------+------------------+-----+---------+--------+ Right    Rt Pressure (mmHg)IndexWaveform Comment  +---------+------------------+-----+---------+--------+ Brachial 134                                      +---------+------------------+-----+---------+--------+  PTA      170               1.27 triphasic         +---------+------------------+-----+---------+--------+ DP       163               1.22 triphasic         +---------+------------------+-----+---------+--------+ Great Toe104               0.78                   +---------+------------------+-----+---------+--------+ +---------+------------------+-----+---------+-------+ Left     Lt Pressure (mmHg)IndexWaveform Comment +---------+------------------+-----+---------+-------+ Brachial 134                                     +---------+------------------+-----+---------+-------+ PTA      149               1.11 triphasic         +---------+------------------+-----+---------+-------+ DP       133               0.99 triphasic        +---------+------------------+-----+---------+-------+ Great Toe98                0.73                  +---------+------------------+-----+---------+-------+ +-------+-----------+-----------+------------+------------+ ABI/TBIToday's ABIToday's TBIPrevious ABIPrevious TBI +-------+-----------+-----------+------------+------------+ Right  1.27       0.78       1.29        0.81         +-------+-----------+-----------+------------+------------+ Left   1.11       0.73       1.10        0.89         +-------+-----------+-----------+------------+------------+  Summary: Right: Resting right ankle-brachial index is within normal range. No evidence of significant right lower extremity arterial disease. The right toe-brachial index is normal. Left: Resting left ankle-brachial index is within normal range. No evidence of significant left lower extremity arterial disease. The left toe-brachial index is normal.  *See table(s) above for measurements and observations.  Electronically signed by Harold Barban MD on 09/10/2019 at 2:01:44 PM.    Final    ECHOCARDIOGRAM COMPLETE  Result Date: 09/13/2019    ECHOCARDIOGRAM REPORT   Patient Name:   YASMEENA LEIPHART Date of Exam: 09/13/2019 Medical Rec #:  [id number]            Height:       60.0 in Accession #:    [id number]           Weight:       104.0 lb Date of Birth:  08/05/1931            BSA:          1.414 m Patient Age:    17 years             BP:           131/69 mmHg Patient Gender: F                    HR:           73 bpm. Exam Location:  [address] Procedure: 2D Echo, 3D Echo, Cardiac Doppler and Color Doppler Indications:    Z95.2 Status Post TAVR  History:  Patient has prior history of Echocardiogram examinations, most                 recent 08/08/2019. Aortic Valve Disease; Risk                 Factors:Hypertension.  Status Post TAVR for AS (08-08-19, 33mm                 Edwards Sapien 3).  Sonographer:    Deliah Boston RDCS Referring Phys: [id number] [place]  1. Left ventricular ejection fraction, by estimation, is 60 to 65%. The left ventricle has normal function. The left ventricle has no regional wall motion abnormalities. Left ventricular diastolic parameters are consistent with Grade I diastolic dysfunction (impaired relaxation). Elevated left ventricular end-diastolic pressure.  2. Right ventricular systolic function is normal. The right ventricular size is normal. Tricuspid regurgitation signal is inadequate for assessing PA pressure.  3. Left atrial size was moderately dilated.  4. The mitral valve is degenerative. Mild mitral annular calcification. No evidence of mitral valve regurgitation. No evidence of mitral stenosis.  5. The aortic valve has been repaired/replaced. There is a 23 mm Edwards Sapien prosthetic, stented (TAVR) valve present in the aortic position. Aortic valve mean gradient measures 9.0 mmHg. Aortic valve peak gradient measures 16.3 mmHg. Aortic valve area, by VTI measures 1.99 cm. DI is 0.57. There is trivial perivalvular AI.  6. Aortic dilatation noted. There is mild dilatation of the ascending aorta and of the aortic root measuring 45 mm and 16mm respectively.  7. The inferior vena cava is normal in size with greater than 50% respiratory variability, suggesting right atrial pressure of 3 mmHg.  8. Compared to prior echo 08/08/2019, the transvalvular aortic peak/mean gradients have improved from 23.6/13 to 16.3/55mmHg on current study. The ascending aortic measurement of 76mm is similar to findings on Chest CTA 07/2019 which measured 76mm. FINDINGS  Left Ventricle: Left ventricular ejection fraction, by estimation, is 60 to 65%. The left ventricle has normal function. The left ventricle has no regional wall motion abnormalities. The left ventricular internal cavity size was  normal in size. There is  no left ventricular hypertrophy. Left ventricular diastolic parameters are consistent with Grade I diastolic dysfunction (impaired relaxation). Elevated left ventricular end-diastolic pressure. Right Ventricle: The right ventricular size is normal. No increase in right ventricular wall thickness. Right ventricular systolic function is normal. Tricuspid regurgitation signal is inadequate for assessing PA pressure. Left Atrium: Left atrial size was moderately dilated. Right Atrium: Right atrial size was normal in size. Pericardium: There is no evidence of pericardial effusion. Mitral Valve: The mitral valve is degenerative in appearance. Normal mobility of the mitral valve leaflets. Mild mitral annular calcification. No evidence of mitral valve regurgitation. No evidence of mitral valve stenosis. Tricuspid Valve: The tricuspid valve is normal in structure. Tricuspid valve regurgitation is not demonstrated. No evidence of tricuspid stenosis. Aortic Valve: The aortic valve has been repaired/replaced. Aortic valve regurgitation is trivial. Aortic valve mean gradient measures 9.0 mmHg. Aortic valve peak gradient measures 16.3 mmHg. Aortic valve area, by VTI measures 1.99 cm. There is a 23 mm Edwards Sapien prosthetic, stented (TAVR) valve present in the aortic position. Pulmonic Valve: The pulmonic valve was normal in structure. Pulmonic valve regurgitation is not visualized. No evidence of pulmonic stenosis. Aorta: The aortic root is normal in size and structure and aortic dilatation noted. There is mild dilatation of the ascending aorta and of the aortic root measuring 45 mm and  89mm respectively. Venous: The inferior vena cava is normal in size with greater than 50% respiratory variability, suggesting right atrial pressure of 3 mmHg. IAS/Shunts: No atrial level shunt detected by color flow Doppler.  LEFT VENTRICLE PLAX 2D LVIDd:         4.09 cm  Diastology LVIDs:         2.52 cm  LV e'  lateral:   6.09 cm/s LV PW:         0.86 cm  LV E/e' lateral: 16.3 LV IVS:        0.87 cm  LV e' medial:    8.59 cm/s LVOT diam:     2.10 cm  LV E/e' medial:  11.6 LV SV:         85 LV SV Index:   60 LVOT Area:     3.46 cm  RIGHT VENTRICLE RV S prime:     13.10 cm/s TAPSE (M-mode): 2.4 cm LEFT ATRIUM           Index       RIGHT ATRIUM           Index LA diam:      3.50 cm 2.48 cm/m  RA Area:     13.20 cm LA Vol (A2C): 62.8 ml 44.42 ml/m RA Volume:   28.30 ml  20.02 ml/m LA Vol (A4C): 36.0 ml 25.47 ml/m  AORTIC VALVE AV Area (Vmax):    1.54 cm AV Area (Vmean):   1.49 cm AV Area (VTI):     1.99 cm AV Vmax:           202.00 cm/s AV Vmean:          136.500 cm/s AV VTI:            0.424 m AV Peak Grad:      16.3 mmHg AV Mean Grad:      9.0 mmHg LVOT Vmax:         90.00 cm/s LVOT Vmean:        58.600 cm/s LVOT VTI:          0.244 m LVOT/AV VTI ratio: 0.57  AORTA Ao Root diam: 3.90 cm Ao Asc diam:  4.45 cm MITRAL VALVE MV Area (PHT): cm          SHUNTS MV Decel Time: 218 msec     Systemic VTI:  0.24 m MV E velocity: 99.50 cm/s   Systemic Diam: 2.10 cm MV A velocity: 130.00 cm/s MV E/A ratio:  0.77 Fransico Him MD Electronically signed by Fransico Him MD Signature Date/Time: 09/13/2019/4:22:10 PM    Final    DG HIP OPERATIVE UNILAT W OR W/O PELVIS LEFT  Result Date: 10/02/2019 CLINICAL DATA:  Left total hip arthroplasty, anterior approach EXAM: OPERATIVE LEFT HIP (WITH PELVIS IF PERFORMED) 2 VIEWS TECHNIQUE: Fluoroscopic spot image(s) were submitted for interpretation post-operatively. COMPARISON:  Femoral radiographs 09/19/2019 FINDINGS: Interval removal of the cannulated transcervical screws of the left femur with subsequent placement of a total left hip arthroplasty including acetabular cup and articulating femoral stem which are in expected alignment. There postsurgical soft tissue changes including soft tissue gas and intra-articular gas at the left hip. No acute complication. Remaining osseous structures  are unremarkable. IMPRESSION: Interval placement of a left total hip arthroplasty without acute complication. Electronically Signed   By: Lovena Le M.D.   On: 10/02/2019 19:17   DG Femur Min 2 Views Left  Result Date: 09/19/2019 CLINICAL DATA:  Fall, left-sided pain  EXAM: LEFT FEMUR 2 VIEWS COMPARISON:  09/17/2019 left femur radiograph FINDINGS: Three fixation pins in the left femoral neck to peer intact with no evidence of loosening. Healed deformity in the left femoral head and neck with articular collapse. No acute osseous fracture. No left hip dislocation. Moderate osteoarthritis in the weight-bearing portion of the left hip. No focal osseous lesions. IMPRESSION: No acute osseous fracture. Healed deformity in the left femoral head and neck with fixation pins in the left femoral neck, without hardware complication. Osteoarthritis in the weight-bearing portion of the left hip. Electronically Signed   By: Ilona Sorrel M.D.   On: 09/19/2019 11:08   LE BYPASS GRAFT    [id number]  Result Date: 09/10/2019 LOWER EXTREMITY ARTERIAL DUPLEX STUDY Indications: Peripheral artery disease.  Vascular Interventions: Right SFA stent and mechanical thrombectomy of right                         SFA/popliteal arteries 06/11/2019. Current ABI:            R=1.27, L=1.11 Performing Technologist: Ronal Fear RVS, RCS  Examination Guidelines: A complete evaluation includes B-mode imaging, spectral Doppler, color Doppler, and power Doppler as needed of all accessible portions of each vessel. Bilateral testing is considered an integral part of a complete examination. Limited examinations for reoccurring indications may be performed as noted.  Right Stent(s):superficial femoral-popliteal artery +---------------+---++---------++ Prox to Stent  162triphasic +---------------+---++---------++ Proximal Stent 68 biphasic  +---------------+---++---------++ Mid Stent      70 biphasic   +---------------+---++---------++ Distal Stent   102biphasic  +---------------+---++---------++ Distal to Stent104triphasic +---------------+---++---------++    Summary: See table(s) above for measurements and observations. Electronically signed by Harold Barban MD on 09/10/2019 at 2:01:36 PM.    Final     Disposition: Discharge disposition: 01-Home or Self Care         Follow-up Information    Mcarthur Rossetti, MD. Go on 10/16/2019.   Specialty: Orthopedic Surgery Why: at 9:30 am for 2 week post-op appointment in office with Dr. Clarita Leber information: [place] [id number] [phone]            Signed: Erskine Emery 10/09/2019, 2:37 PM

## 2019-10-10 ENCOUNTER — Telehealth: Payer: Self-pay | Admitting: *Deleted

## 2019-10-10 DIAGNOSIS — I10 Essential (primary) hypertension: Secondary | ICD-10-CM | POA: Diagnosis not present

## 2019-10-10 DIAGNOSIS — I739 Peripheral vascular disease, unspecified: Secondary | ICD-10-CM | POA: Diagnosis not present

## 2019-10-10 DIAGNOSIS — M800AXD Age-related osteoporosis with current pathological fracture, other site, subsequent encounter for fracture with routine healing: Secondary | ICD-10-CM | POA: Diagnosis not present

## 2019-10-10 DIAGNOSIS — M069 Rheumatoid arthritis, unspecified: Secondary | ICD-10-CM | POA: Diagnosis not present

## 2019-10-10 DIAGNOSIS — M80052D Age-related osteoporosis with current pathological fracture, left femur, subsequent encounter for fracture with routine healing: Secondary | ICD-10-CM | POA: Diagnosis not present

## 2019-10-10 DIAGNOSIS — F329 Major depressive disorder, single episode, unspecified: Secondary | ICD-10-CM | POA: Diagnosis not present

## 2019-10-10 NOTE — Telephone Encounter (Signed)
7 day Ortho bundle call completed. 

## 2019-10-10 NOTE — Care Plan (Signed)
RNCM spoke with patient today for her 1 week post-op call. She states she is doing extremely well and verbalized appreciation for helping with D/C planning needs. She verbalized she is having little pain and takes Tylenol if needed; also reinforced taking her aspirin twice daily as well as compression hose to help with DVTs. Therapy is coming today she states to resume HHPT. Discussed upcoming appointment and request of her daughter to change this to accommodate her schedule since she will be bringing her. Patient verbalized agreement. Reminded how to contact CM for further questions or needs.

## 2019-10-15 DIAGNOSIS — M80052D Age-related osteoporosis with current pathological fracture, left femur, subsequent encounter for fracture with routine healing: Secondary | ICD-10-CM | POA: Diagnosis not present

## 2019-10-15 DIAGNOSIS — M800AXD Age-related osteoporosis with current pathological fracture, other site, subsequent encounter for fracture with routine healing: Secondary | ICD-10-CM | POA: Diagnosis not present

## 2019-10-15 DIAGNOSIS — I10 Essential (primary) hypertension: Secondary | ICD-10-CM | POA: Diagnosis not present

## 2019-10-15 DIAGNOSIS — F329 Major depressive disorder, single episode, unspecified: Secondary | ICD-10-CM | POA: Diagnosis not present

## 2019-10-15 DIAGNOSIS — I739 Peripheral vascular disease, unspecified: Secondary | ICD-10-CM | POA: Diagnosis not present

## 2019-10-15 DIAGNOSIS — M069 Rheumatoid arthritis, unspecified: Secondary | ICD-10-CM | POA: Diagnosis not present

## 2019-10-16 ENCOUNTER — Inpatient Hospital Stay: Payer: Medicare Other | Admitting: Orthopaedic Surgery

## 2019-10-17 DIAGNOSIS — M800AXD Age-related osteoporosis with current pathological fracture, other site, subsequent encounter for fracture with routine healing: Secondary | ICD-10-CM | POA: Diagnosis not present

## 2019-10-17 DIAGNOSIS — I10 Essential (primary) hypertension: Secondary | ICD-10-CM | POA: Diagnosis not present

## 2019-10-17 DIAGNOSIS — M80052D Age-related osteoporosis with current pathological fracture, left femur, subsequent encounter for fracture with routine healing: Secondary | ICD-10-CM | POA: Diagnosis not present

## 2019-10-17 DIAGNOSIS — I739 Peripheral vascular disease, unspecified: Secondary | ICD-10-CM | POA: Diagnosis not present

## 2019-10-17 DIAGNOSIS — F329 Major depressive disorder, single episode, unspecified: Secondary | ICD-10-CM | POA: Diagnosis not present

## 2019-10-17 DIAGNOSIS — M069 Rheumatoid arthritis, unspecified: Secondary | ICD-10-CM | POA: Diagnosis not present

## 2019-10-18 ENCOUNTER — Encounter: Payer: Self-pay | Admitting: Orthopaedic Surgery

## 2019-10-18 ENCOUNTER — Telehealth: Payer: Self-pay | Admitting: *Deleted

## 2019-10-18 ENCOUNTER — Other Ambulatory Visit: Payer: Self-pay

## 2019-10-18 ENCOUNTER — Ambulatory Visit (INDEPENDENT_AMBULATORY_CARE_PROVIDER_SITE_OTHER): Payer: Medicare Other | Admitting: Orthopaedic Surgery

## 2019-10-18 DIAGNOSIS — Z96642 Presence of left artificial hip joint: Secondary | ICD-10-CM

## 2019-10-18 NOTE — Progress Notes (Signed)
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 She is 86 years old. We remove the screws and then performed the anterior hip replacement. She is doing well overall and walking with a walker. So far she does feel like she is making good progress.  On exam she is moving her hip around easier. I removed her staples and placed Steri-Strips overall she looks great. She is glad to be back home as well.  At this point she will continue to slowly increase her activities as comfort allows. I like to see her back in 4 weeks to see how she is doing overall but no x-rays are needed.

## 2019-10-18 NOTE — Telephone Encounter (Signed)
14 day Ortho bundle call/in office visit completed.

## 2019-10-18 NOTE — Care Plan (Signed)
RNCM met with patient and her daughter during her in office 2 week post-op visit with Dr. Ninfa Linden today. He removed staples and applied steri-strips to incision. She is ambulating with a RW and states overall she is doing well. She does continue to have paid CGs in the home and also is still having HHPT come to her home, which may be ending soon. Family asked when she can be alone in the home and it was explained that when she feels she can get around (fix a meal, shower, etc) safely. They would just have to use their best judgement on this. Reminded to contact CM with any questions or needs. Will continue to follow for any further needs.

## 2019-10-22 DIAGNOSIS — Z96642 Presence of left artificial hip joint: Secondary | ICD-10-CM | POA: Diagnosis not present

## 2019-10-22 DIAGNOSIS — Z471 Aftercare following joint replacement surgery: Secondary | ICD-10-CM | POA: Diagnosis not present

## 2019-10-22 DIAGNOSIS — Z952 Presence of prosthetic heart valve: Secondary | ICD-10-CM | POA: Diagnosis not present

## 2019-10-22 DIAGNOSIS — Z87310 Personal history of (healed) osteoporosis fracture: Secondary | ICD-10-CM | POA: Diagnosis not present

## 2019-10-22 DIAGNOSIS — Z79891 Long term (current) use of opiate analgesic: Secondary | ICD-10-CM | POA: Diagnosis not present

## 2019-10-22 DIAGNOSIS — F329 Major depressive disorder, single episode, unspecified: Secondary | ICD-10-CM | POA: Diagnosis not present

## 2019-10-22 DIAGNOSIS — I739 Peripheral vascular disease, unspecified: Secondary | ICD-10-CM | POA: Diagnosis not present

## 2019-10-22 DIAGNOSIS — M069 Rheumatoid arthritis, unspecified: Secondary | ICD-10-CM | POA: Diagnosis not present

## 2019-10-22 DIAGNOSIS — E43 Unspecified severe protein-calorie malnutrition: Secondary | ICD-10-CM | POA: Diagnosis not present

## 2019-10-22 DIAGNOSIS — Z86718 Personal history of other venous thrombosis and embolism: Secondary | ICD-10-CM | POA: Diagnosis not present

## 2019-10-22 DIAGNOSIS — H269 Unspecified cataract: Secondary | ICD-10-CM | POA: Diagnosis not present

## 2019-10-22 DIAGNOSIS — G5603 Carpal tunnel syndrome, bilateral upper limbs: Secondary | ICD-10-CM | POA: Diagnosis not present

## 2019-10-22 DIAGNOSIS — M810 Age-related osteoporosis without current pathological fracture: Secondary | ICD-10-CM | POA: Diagnosis not present

## 2019-10-22 DIAGNOSIS — I10 Essential (primary) hypertension: Secondary | ICD-10-CM | POA: Diagnosis not present

## 2019-10-22 DIAGNOSIS — Z7982 Long term (current) use of aspirin: Secondary | ICD-10-CM | POA: Diagnosis not present

## 2019-10-22 DIAGNOSIS — Z9181 History of falling: Secondary | ICD-10-CM | POA: Diagnosis not present

## 2019-10-22 DIAGNOSIS — I712 Thoracic aortic aneurysm, without rupture: Secondary | ICD-10-CM | POA: Diagnosis not present

## 2019-10-22 DIAGNOSIS — K5909 Other constipation: Secondary | ICD-10-CM | POA: Diagnosis not present

## 2019-10-22 DIAGNOSIS — F339 Major depressive disorder, recurrent, unspecified: Secondary | ICD-10-CM | POA: Diagnosis not present

## 2019-10-24 DIAGNOSIS — M810 Age-related osteoporosis without current pathological fracture: Secondary | ICD-10-CM | POA: Diagnosis not present

## 2019-10-24 DIAGNOSIS — F329 Major depressive disorder, single episode, unspecified: Secondary | ICD-10-CM | POA: Diagnosis not present

## 2019-10-24 DIAGNOSIS — Z96642 Presence of left artificial hip joint: Secondary | ICD-10-CM | POA: Diagnosis not present

## 2019-10-24 DIAGNOSIS — I10 Essential (primary) hypertension: Secondary | ICD-10-CM | POA: Diagnosis not present

## 2019-10-24 DIAGNOSIS — Z471 Aftercare following joint replacement surgery: Secondary | ICD-10-CM | POA: Diagnosis not present

## 2019-10-24 DIAGNOSIS — I739 Peripheral vascular disease, unspecified: Secondary | ICD-10-CM | POA: Diagnosis not present

## 2019-10-29 DIAGNOSIS — F329 Major depressive disorder, single episode, unspecified: Secondary | ICD-10-CM | POA: Diagnosis not present

## 2019-10-29 DIAGNOSIS — I739 Peripheral vascular disease, unspecified: Secondary | ICD-10-CM | POA: Diagnosis not present

## 2019-10-29 DIAGNOSIS — I10 Essential (primary) hypertension: Secondary | ICD-10-CM | POA: Diagnosis not present

## 2019-10-29 DIAGNOSIS — Z96642 Presence of left artificial hip joint: Secondary | ICD-10-CM | POA: Diagnosis not present

## 2019-10-29 DIAGNOSIS — M810 Age-related osteoporosis without current pathological fracture: Secondary | ICD-10-CM | POA: Diagnosis not present

## 2019-10-29 DIAGNOSIS — Z471 Aftercare following joint replacement surgery: Secondary | ICD-10-CM | POA: Diagnosis not present

## 2019-11-02 ENCOUNTER — Other Ambulatory Visit: Payer: Self-pay | Admitting: *Deleted

## 2019-11-02 ENCOUNTER — Telehealth: Payer: Self-pay | Admitting: *Deleted

## 2019-11-02 MED ORDER — ROSUVASTATIN CALCIUM 10 MG PO TABS: 10.0000 mg | ORAL_TABLET | Freq: Every day | ORAL | 2 refills | Status: DC

## 2019-11-02 MED ORDER — CLOPIDOGREL BISULFATE 75 MG PO TABS: 75.0000 mg | ORAL_TABLET | Freq: Every day | ORAL | 2 refills | Status: DC

## 2019-11-02 NOTE — Telephone Encounter (Signed)
Ortho bundle 30 day call completed. °

## 2019-11-09 DIAGNOSIS — M810 Age-related osteoporosis without current pathological fracture: Secondary | ICD-10-CM | POA: Diagnosis not present

## 2019-11-09 DIAGNOSIS — F329 Major depressive disorder, single episode, unspecified: Secondary | ICD-10-CM | POA: Diagnosis not present

## 2019-11-09 DIAGNOSIS — Z471 Aftercare following joint replacement surgery: Secondary | ICD-10-CM | POA: Diagnosis not present

## 2019-11-09 DIAGNOSIS — Z96642 Presence of left artificial hip joint: Secondary | ICD-10-CM | POA: Diagnosis not present

## 2019-11-09 DIAGNOSIS — I10 Essential (primary) hypertension: Secondary | ICD-10-CM | POA: Diagnosis not present

## 2019-11-09 DIAGNOSIS — I739 Peripheral vascular disease, unspecified: Secondary | ICD-10-CM | POA: Diagnosis not present

## 2019-11-13 DIAGNOSIS — F329 Major depressive disorder, single episode, unspecified: Secondary | ICD-10-CM | POA: Diagnosis not present

## 2019-11-13 DIAGNOSIS — I739 Peripheral vascular disease, unspecified: Secondary | ICD-10-CM | POA: Diagnosis not present

## 2019-11-13 DIAGNOSIS — Z471 Aftercare following joint replacement surgery: Secondary | ICD-10-CM | POA: Diagnosis not present

## 2019-11-13 DIAGNOSIS — I10 Essential (primary) hypertension: Secondary | ICD-10-CM | POA: Diagnosis not present

## 2019-11-13 DIAGNOSIS — M810 Age-related osteoporosis without current pathological fracture: Secondary | ICD-10-CM | POA: Diagnosis not present

## 2019-11-13 DIAGNOSIS — Z96642 Presence of left artificial hip joint: Secondary | ICD-10-CM | POA: Diagnosis not present

## 2019-11-15 ENCOUNTER — Telehealth: Payer: Self-pay

## 2019-11-15 ENCOUNTER — Other Ambulatory Visit: Payer: Self-pay

## 2019-11-15 ENCOUNTER — Ambulatory Visit (INDEPENDENT_AMBULATORY_CARE_PROVIDER_SITE_OTHER): Payer: Medicare Other | Admitting: Orthopaedic Surgery

## 2019-11-15 ENCOUNTER — Encounter: Payer: Self-pay | Admitting: Orthopaedic Surgery

## 2019-11-15 DIAGNOSIS — Z96642 Presence of left artificial hip joint: Secondary | ICD-10-CM

## 2019-11-15 NOTE — Progress Notes (Signed)
The patient is an 86 year old female who is now 6 weeks status post removal of cannulated screws from a left hip from a failed hip pinning after femoral neck fracture.  We then converted this to a total hip arthroplasty.  She is ambulate with a cane.  She states that she is already ready to drive and has no issues at all.  Her family is with her states that they are comfortable with her driving.  She is absolutely getting up easily and walking around the room.  She is using a quad cane but minimally.  She does tolerating putting her left hip through internal and external rotation with no issues at all.  From my standpoint she can drive.  I will see her back in 3 months.  At that visit I like a standing lady pelvis and lateral of her left operative hip.  If there is any issues before then they will let us know.

## 2019-11-15 NOTE — Telephone Encounter (Signed)
Called to schedule OV with Dr. Burt Knack in July per KT.  Will arrange CT prior to that visit.  Left message for Arrie Aran (patient's daughter) to call back to arrange per patient request.

## 2019-11-27 NOTE — Telephone Encounter (Signed)
Left message to call back  

## 2019-12-11 ENCOUNTER — Other Ambulatory Visit: Payer: Self-pay

## 2019-12-11 ENCOUNTER — Ambulatory Visit (INDEPENDENT_AMBULATORY_CARE_PROVIDER_SITE_OTHER)
Admission: RE | Admit: 2019-12-11 | Discharge: 2019-12-11 | Disposition: A | Discharge status: Home / Self Care | Payer: Medicare Other | Source: Ambulatory Visit | Attending: Vascular Surgery | Admitting: Vascular Surgery

## 2019-12-11 ENCOUNTER — Ambulatory Visit (INDEPENDENT_AMBULATORY_CARE_PROVIDER_SITE_OTHER): Payer: Medicare Other | Admitting: Physician Assistant

## 2019-12-11 ENCOUNTER — Ambulatory Visit (HOSPITAL_COMMUNITY)
Admission: RE | Admit: 2019-12-11 | Discharge: 2019-12-11 | Disposition: A | Discharge status: Home / Self Care | Payer: Medicare Other | Source: Ambulatory Visit | Attending: Vascular Surgery | Admitting: Vascular Surgery

## 2019-12-11 VITALS — BP 149/75 | HR 71 | Resp 14 | Ht 60.0 in | Wt 104.0 lb

## 2019-12-11 DIAGNOSIS — I70233 Atherosclerosis of native arteries of right leg with ulceration of ankle: Secondary | ICD-10-CM | POA: Diagnosis not present

## 2019-12-11 DIAGNOSIS — I350 Nonrheumatic aortic (valve) stenosis: Secondary | ICD-10-CM | POA: Insufficient documentation

## 2019-12-11 DIAGNOSIS — I739 Peripheral vascular disease, unspecified: Secondary | ICD-10-CM | POA: Diagnosis not present

## 2019-12-11 NOTE — Progress Notes (Signed)
Office Note     CC:  follow up Requesting Provider:  Celene Squibb, MD  HPI: Michaela Simpson is a 86 y.o. (February 08, 1932) female who presents for follow up of her peripheral vascular disease with hx of right lower extremity ulceration. At her last visit in March, her pre-tibial ulcer had very nearly healed and she had normal ABIs and toe pressures.  Her stents were patent.   Her daughter accompanies her today.  She underwent hip surgery recently without complications.  She denies claudication or rest pain.  She is s/p abdominal aortogram, bilateral lower extremity runoff. Stent of right superficial femoral- popliteal artery. Mechanical Thrombectomy of the right superficial femoral -popliteal artery using the penumbra CAT 6 device. Mynx closure device of left femoral artery 05/08/2019 by Dr. Trula Slade for non healing right leg ulceration.  The pt is on a statin for cholesterol management.  The pt is on a daily aspirin.   Other AC: Plavix The pt is on Amlodipine for hypertension.   The pt is not diabetic. Tobacco hx:  Never Past Medical History:  Diagnosis Date  . AKI (acute kidney injury) ([facility])   . Anemia   . Aortic stenosis   . Bruising    large bruise to left neck and T/O body  . Depression   . Early cataract   . Headache   . Hip fracture ([facility])   . HTN (hypertension)   . Humerus fracture   . Leukocytosis   . 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 Osteoporosis   . Peripheral vascular disease ([facility] [place])    blood clot right leg  . Rheumatoid arthritis ([facility])   . Thoracic ascending aortic aneurysm ([facility])    4.4 cm by 07/26/19 CTA  . Ulcer of lower extremity ([facility])   . Wears glasses     Past Surgical History:  Procedure Laterality Date  . ABDOMINAL AORTOGRAM W/LOWER EXTREMITY N/A 05/08/2019   Procedure: ABDOMINAL AORTOGRAM W/LOWER EXTREMITY;  Surgeon: Serafina Mitchell, MD;  Location:  [facility];  Service: Cardiovascular;  Laterality: N/A;  . HARDWARE REMOVAL Left 10/02/2019   Procedure: HARDWARE REMOVAL;  Surgeon: Mcarthur Rossetti, MD;  Location: [facility];  Service: Orthopedics;  Laterality: Left;  . HIP PINNING,CANNULATED Left 03/09/2019   Procedure: CANNULATED HIP PINNING;  Surgeon: Mcarthur Rossetti, MD;  Location: [facility];  Service: Orthopedics;  Laterality: Left;  . INTRAMEDULLARY (IM) NAIL INTERTROCHANTERIC Right 03/14/2015   Procedure: RIGHT INTERTROCHANTRIC INTRAMEDULLARY (IM) NAIL ;  Surgeon: Mcarthur Rossetti, MD;  Location: [facility];  Service: Orthopedics;  Laterality: Right;  . PERIPHERAL VASCULAR INTERVENTION Right 05/08/2019   Procedure: PERIPHERAL VASCULAR INTERVENTION;  Surgeon: Serafina Mitchell, MD;  Location: [facility];  Service: Cardiovascular;  Laterality: Right;  . RIGHT/LEFT HEART CATH AND CORONARY ANGIOGRAPHY N/A 07/19/2019   Procedure: RIGHT/LEFT HEART CATH AND CORONARY ANGIOGRAPHY;  Surgeon: Sherren Mocha, MD;  Location: [facility];  Service: Cardiovascular;  Laterality: N/A;  . TEE WITHOUT CARDIOVERSION N/A 08/07/2019   Procedure: TRANSESOPHAGEAL ECHOCARDIOGRAM (TEE);  Surgeon: Sherren Mocha, MD;  Location: [facility];  Service: Open Heart Surgery;  Laterality: N/A;  . TOTAL HIP ARTHROPLASTY Left 10/02/2019   Procedure: LEFT TOTAL HIP ARTHROPLASTY ANTERIOR APPROACH AND REMOVAL OF CANNULATED SCREWS OF LEFT HIP;  Surgeon: Mcarthur Rossetti, MD;  Location: [facility];  Service: Orthopedics;  Laterality: Left;  .  TRANSCATHETER AORTIC VALVE REPLACEMENT, TRANSFEMORAL  08/07/2019  . TRANSCATHETER AORTIC VALVE REPLACEMENT, TRANSFEMORAL N/A 08/07/2019   Procedure: TRANSCATHETER AORTIC VALVE REPLACEMENT, TRANSFEMORAL;  Surgeon: Sherren Mocha, MD;  Location: [facility];  Service: Open Heart Surgery;  Laterality: N/A;    Social History   Socioeconomic History  . Marital status: Widowed    Spouse name: Not on  file  . Number of children: Not on file  . Years of education: Not on file  . Highest education level: Not on file  Occupational History  . Occupation: retired  Tobacco Use  . Smoking status: Never Smoker  . Smokeless tobacco: Never Used  Vaping Use  . Vaping Use: Never used  Substance and Sexual Activity  . Alcohol use: No  . Drug use: No  . Sexual activity: Not on file  Other Topics Concern  . Not on file  Social History Narrative  . Not on file   Social Determinants of Health   Financial Resource Strain: Low Risk   . Difficulty of Paying Living Expenses: Not hard at all  Food Insecurity: No Food Insecurity  . Worried About Charity fundraiser in the Last Year: Never true  . Ran Out of Food in the Last Year: Never true  Transportation Needs: No Transportation Needs  . Lack of Transportation (Medical): No  . Lack of Transportation (Non-Medical): No  Physical Activity: Unknown  . Days of Exercise per Week: Patient refused  . Minutes of Exercise per Session: Patient refused  Stress: No Stress Concern Present  . Feeling of Stress : Not at all  Social Connections: Unknown  . Frequency of Communication with Friends and Family: Patient refused  . Frequency of Social Gatherings with Friends and Family: Patient refused  . Attends Religious Services: Patient refused  . Active Member of Clubs or Organizations: Patient refused  . Attends Archivist Meetings: Patient refused  . Marital Status: Patient refused  Intimate Partner Violence: Unknown  . Fear of Current or Ex-Partner: Patient refused  . Emotionally Abused: Patient refused  . Physically Abused: Patient refused  . Sexually Abused: Patient refused   Family History  Problem Relation Age of Onset  . Cancer Sister     Current Outpatient Medications  Medication Sig Dispense Refill  . acetaminophen (TYLENOL) 325 MG tablet Take 2 tablets (650 mg total) by mouth every 6 (six) hours as needed for mild pain (or  Fever >/= 101).    Marland Kitchen amLODipine (NORVASC) 2.5 MG tablet Take 1 tablet (2.5 mg total) by mouth at bedtime. 30 tablet 0  . aspirin EC 81 MG tablet Take 81 mg by mouth daily.    . [place]-Glucosamine-Vit D (OSTEO BI-FLEX ONE PER DAY PO) Take 1 tablet by mouth daily.    . Cholecalciferol (VITAMIN D-3) 125 MCG (5000 UT) TABS Take 1 tablet by mouth daily. 90 tablet 3  . clindamycin (CLEOCIN) 300 MG capsule Take 2 capsules (600mg ) 1 hour prior to all dental visits. (Patient taking differently: Take 600 mg by mouth See admin instructions. Take 2 capsules (600mg ) 1 hour prior to all dental visits.) 4 capsule 11  . clopidogrel (PLAVIX) 75 MG tablet Take 1 tablet (75 mg total) by mouth daily. 90 tablet 2  . HYDROcodone-acetaminophen (NORCO/VICODIN) 5-325 MG tablet Take 1 tablet by mouth every 6 (six) hours as needed for moderate pain or severe pain. 30 tablet 0  . Lactobacillus (PROBIOTIC ACIDOPHILUS PO) Take 1 tablet by mouth daily.    Marland Kitchen  lactose free nutrition (BOOST) LIQD Take 237 mLs by mouth 3 (three) times daily.     Marland Kitchen LORazepam (ATIVAN) 0.5 MG tablet Take 0.5 mg by mouth at bedtime.     . Magnesium 400 MG CAPS Take 400 mg by mouth daily. 90 capsule 1  . Menatetrenone (VITAMIN K2) 100 MCG TABS Take 100 mcg by mouth daily. 90 tablet 3  . Multiple Vitamins-Minerals (MULTIVITAMIN WITH MINERALS) tablet Take 1 tablet by mouth daily.    . Polyethylene Glycol 3350 (MIRALAX PO) Take 17 g by mouth daily.     . rosuvastatin (CRESTOR) 10 MG tablet Take 1 tablet (10 mg total) by mouth at bedtime. 90 tablet 2  . sertraline (ZOLOFT) 100 MG tablet Take 150 mg by mouth daily.     . traMADol (ULTRAM) 50 MG tablet Take 1 tablet (50 mg total) by mouth every 6 (six) hours as needed. (Patient taking differently: Take 50 mg by mouth every 6 (six) hours as needed for moderate pain. ) 40 tablet 0  . vitamin B-12 (CYANOCOBALAMIN) 1000 MCG tablet Take 1,000 mcg by mouth daily.     No current facility-administered medications  for this visit.    Allergies  Allergen Reactions  . Penicillins Nausea And Vomiting and Other (See Comments)    Child hood allergy - Flu symptoms; constant vomiting  Did it involve swelling of the face/tongue/throat, SOB, or low BP? Unknown Did it involve sudden or severe rash/hives, skin peeling, or any reaction on the inside of your mouth or nose? Unknown Did you need to seek medical attention at a hospital or doctor's office? Unknown When did it last happen?50+ years If all above answers are "NO", may proceed with cephalosporin use.   . Codeine Nausea And Vomiting and Rash    INTOLERANCE >  VOMITING     REVIEW OF SYSTEMS:   [X]  denotes positive finding, [ ]  denotes negative finding Cardiac  Comments:  Chest pain or chest pressure:    Shortness of breath upon exertion:    Short of breath when lying flat:    Irregular heart rhythm:        Vascular    Pain in calf, thigh, or hip brought on by ambulation:    Pain in feet at night that wakes you up from your sleep:     Blood clot in your veins:    Leg swelling:         Pulmonary    Oxygen at home:    Productive cough:     Wheezing:         Neurologic    Sudden weakness in arms or legs:     Sudden numbness in arms or legs:     Sudden onset of difficulty speaking or slurred speech:    Temporary loss of vision in one eye:     Problems with dizziness:         Gastrointestinal    Blood in stool:     Vomited blood:         Genitourinary    Burning when urinating:     Blood in urine:        Psychiatric    Major depression:         Hematologic    Bleeding problems:    Problems with blood clotting too easily:        Skin    Rashes or ulcers:        Constitutional    Fever or chills:  PHYSICAL EXAMINATION: Vitals:   12/11/19 1259  BP: (!) 149/75  Pulse: 71  Resp: 14  SpO2: 100%   General:  WDWN in NAD; vital signs documented above Gait: Steady, no ataxia HENT: WNL, normocephalic Pulmonary:  normal non-labored breathing , without Rales, rhonchi,  wheezing Cardiac: regular HR, without  Murmurs without carotid bruit Abdomen: soft, NT, no masses Skin: without rashes Vascular Exam/Pulses: 2+ bilateral radial, dorsalis pedis and right posterior tibial palpable pulses.  Left PT pulse 1+. Extremities: without ischemic changes, without Gangrene , without cellulitis; without open wounds; right pretibial ulcer is healed Musculoskeletal: no muscle wasting or atrophy  Neurologic: A&O X 3;  No focal weakness or paresthesias are detected Psychiatric:  The pt has Normal affect.     Non-Invasive Vascular Imaging:   Right Stent(s):  +---------------+--++----------+---------------+  Prox to Stent 32monophasic          +---------------+--++----------+---------------+  Proximal Stent 80biphasic origin 187 cm/s  +---------------+--++----------+---------------+  Mid Stent   74biphasic           +---------------+--++----------+---------------+  Distal Stent  67biphasic           +---------------+--++----------+---------------+  Distal to Stent86biphasic           +---------------+--++----------+---------------+   Summary:  Right: Patent superficial femoral and popliteal artery artery stenting  without evidence of stenosis.   ABI/TBIToday's ABIToday's TBIPrevious ABIPrevious TBI  +-------+-----------+-----------+------------+------------+  Right 1.30    0.81    1.27    0.78      +-------+-----------+-----------+------------+------------+  Left  1.09    0.69    1.11    0.73        ASSESSMENT/PLAN:: 86 y.o. female here for follow up for PAD.  Patent right SFA, popliteal stent.  Stable ABIs.  Arterial disease slightly worse on the left as compared to the right on today's TBI's.  She has palpable pedal pulses.  Her right pretibial ulcer is >95% healed.  Discussed continuing her  aspirin, Plavix and statin.  Continue exercise and walking program.  Vigilant skin care and advised to alert [place] for nonhealing wound, worsening leg pain, worsening lower extremity fatigue or rest pain.   Barbie Banner, PA-C [facility] [phone]  Clinic MD:  Carlis Abbott

## 2019-12-13 ENCOUNTER — Other Ambulatory Visit: Payer: Self-pay | Admitting: *Deleted

## 2019-12-13 DIAGNOSIS — I70233 Atherosclerosis of native arteries of right leg with ulceration of ankle: Secondary | ICD-10-CM

## 2019-12-13 DIAGNOSIS — I739 Peripheral vascular disease, unspecified: Secondary | ICD-10-CM

## 2019-12-24 ENCOUNTER — Other Ambulatory Visit: Payer: Self-pay | Admitting: Family Medicine

## 2019-12-24 DIAGNOSIS — K5900 Constipation, unspecified: Secondary | ICD-10-CM

## 2020-01-04 ENCOUNTER — Telehealth: Payer: Self-pay | Admitting: *Deleted

## 2020-01-04 NOTE — Telephone Encounter (Signed)
Attempted 90 day call to patient. No answer and left VM on her home number as well as daughter's cell number.

## 2020-01-07 ENCOUNTER — Telehealth: Payer: Self-pay | Admitting: *Deleted

## 2020-01-07 NOTE — Telephone Encounter (Signed)
Ortho bundle 90 day call completed. 

## 2020-01-08 ENCOUNTER — Encounter: Payer: Self-pay | Admitting: Surgery

## 2020-02-18 NOTE — Telephone Encounter (Signed)
Spoke with the patient, who will have [facility] call and ask for Dr. Antionette Char nurse.

## 2020-02-21 ENCOUNTER — Encounter: Payer: Self-pay | Admitting: Cardiovascular Disease

## 2020-02-21 NOTE — Telephone Encounter (Signed)
Patient's daughter returning call from [place]

## 2020-02-21 NOTE — Telephone Encounter (Signed)
Scheduled patient for overdue 3 mo visit with Dr. Burt Knack 9/27. Her daughter, Arrie Aran, does not wish to repeat chest CT at this time and will talk about the need to repeat with Dr. Burt Knack. She was grateful for assistance.

## 2020-02-21 NOTE — Telephone Encounter (Signed)
This encounter was created in error - please disregard.

## 2020-03-17 ENCOUNTER — Ambulatory Visit (INDEPENDENT_AMBULATORY_CARE_PROVIDER_SITE_OTHER): Payer: Medicare Other | Admitting: Cardiovascular Disease

## 2020-03-17 ENCOUNTER — Encounter: Payer: Self-pay | Admitting: Cardiovascular Disease

## 2020-03-17 ENCOUNTER — Other Ambulatory Visit: Payer: Self-pay

## 2020-03-17 VITALS — BP 144/78 | HR 72 | Ht 60.0 in | Wt 111.2 lb

## 2020-03-17 DIAGNOSIS — I712 Thoracic aortic aneurysm, without rupture, unspecified: Secondary | ICD-10-CM

## 2020-03-17 DIAGNOSIS — Z952 Presence of prosthetic heart valve: Secondary | ICD-10-CM | POA: Diagnosis not present

## 2020-03-17 DIAGNOSIS — I10 Essential (primary) hypertension: Secondary | ICD-10-CM

## 2020-03-17 DIAGNOSIS — I70233 Atherosclerosis of native arteries of right leg with ulceration of ankle: Secondary | ICD-10-CM | POA: Diagnosis not present

## 2020-03-17 NOTE — Progress Notes (Signed)
Cardiology Office Note:    Date:  03/17/2020   ID:  Tinesha, Siegrist 19-Dec-1931, MRN [id number]  PCP:  Celene Squibb, MD  [facility] Cardiologist:  Sherren Mocha, MD  [place] Electrophysiologist:  None   Referring MD: Celene Squibb, MD   Chief Complaint  Patient presents with  . Status post TAVR    History of Present Illness:    Michaela Simpson is a 86 y.o. female with a hx of HTN, rheumatoid arthritis,anemia, CAD, PAD with nonhealing ulcer s/p rightsuperficial femoral-popliteal arterystenting(04/2019)and severe AS s/p TAVR (08/07/19)   The patient is here with her daughter today.  She has been doing very well.  A few months after her TAVR procedure, she had a mechanical fall.  She had some rib fractures and ultimately required hip replacement as well.  From a cardiac perspective, she is doing very well.  She denies chest pain, shortness of breath, heart palpitations, or lightheadedness.  She is able to walk much better than she could prior to TAVR without significant functional limitation.  Past Medical History:  Diagnosis Date  . AKI (acute kidney injury) ([place])   . Anemia   . Aortic stenosis   . Bruising    large bruise to left neck and T/O body  . Depression   . Early cataract   . Headache   . Hip fracture ([place])   . HTN (hypertension)   . Humerus fracture   . Leukocytosis   . Osteonecrosis ([place])    post-traumatic osteonecrosis of the femoral head of the left hip, retained orthopedic hardware ( order for conset)  . Osteoporosis   . Peripheral vascular disease ([place])    blood clot right leg  . Rheumatoid arthritis ([place])   . Thoracic ascending aortic aneurysm (HCC)    4.4 cm by 07/26/19 CTA  . Ulcer of lower extremity ([place])   . Wears glasses     Past Surgical History:  Procedure Laterality Date  . ABDOMINAL AORTOGRAM W/LOWER EXTREMITY N/A 05/08/2019   Procedure: ABDOMINAL AORTOGRAM W/LOWER EXTREMITY;  Surgeon: Serafina Mitchell, MD;   Location: [place] [facility];  Service: Cardiovascular;  Laterality: N/A;  . HARDWARE REMOVAL Left 10/02/2019   Procedure: HARDWARE REMOVAL;  Surgeon: Mcarthur Rossetti, MD;  Location: [place];  Service: Orthopedics;  Laterality: Left;  . HIP PINNING,CANNULATED Left 03/09/2019   Procedure: CANNULATED HIP PINNING;  Surgeon: Mcarthur Rossetti, MD;  Location: [place];  Service: Orthopedics;  Laterality: Left;  . INTRAMEDULLARY (IM) NAIL INTERTROCHANTERIC Right 03/14/2015   Procedure: RIGHT INTERTROCHANTRIC INTRAMEDULLARY (IM) NAIL ;  Surgeon: Mcarthur Rossetti, MD;  Location: [place];  Service: Orthopedics;  Laterality: Right;  . PERIPHERAL VASCULAR INTERVENTION Right 05/08/2019   Procedure: PERIPHERAL VASCULAR INTERVENTION;  Surgeon: Serafina Mitchell, MD;  Location: [place] [facility];  Service: Cardiovascular;  Laterality: Right;  . RIGHT/LEFT HEART CATH AND CORONARY ANGIOGRAPHY N/A 07/19/2019   Procedure: RIGHT/LEFT HEART CATH AND CORONARY ANGIOGRAPHY;  Surgeon: Sherren Mocha, MD;  Location: [place] [facility];  Service: Cardiovascular;  Laterality: N/A;  . TEE WITHOUT CARDIOVERSION N/A 08/07/2019   Procedure: TRANSESOPHAGEAL ECHOCARDIOGRAM (TEE);  Surgeon: Sherren Mocha, MD;  Location: [place] [facility];  Service: Open Heart Surgery;  Laterality: N/A;  . TOTAL HIP ARTHROPLASTY Left 10/02/2019   Procedure: LEFT TOTAL HIP ARTHROPLASTY ANTERIOR APPROACH AND REMOVAL OF CANNULATED SCREWS OF LEFT HIP;  Surgeon: Mcarthur Rossetti, MD;  Location: [place];  Service: Orthopedics;  Laterality:  Left;  . TRANSCATHETER AORTIC VALVE REPLACEMENT, TRANSFEMORAL  08/07/2019  . TRANSCATHETER AORTIC VALVE REPLACEMENT, TRANSFEMORAL N/A 08/07/2019   Procedure: TRANSCATHETER AORTIC VALVE REPLACEMENT, TRANSFEMORAL;  Surgeon: Sherren Mocha, MD;  Location: [place] [facility];  Service: Open Heart Surgery;  Laterality: N/A;    Current Medications: Current Meds  Medication Sig  . acetaminophen (TYLENOL)  325 MG tablet Take 2 tablets (650 mg total) by mouth every 6 (six) hours as needed for mild pain (or Fever >/= 101).  Marland Kitchen amLODipine (NORVASC) 2.5 MG tablet Take 1 tablet (2.5 mg total) by mouth at bedtime.  Marland Kitchen aspirin EC 81 MG tablet Take 81 mg by mouth daily.  . [place]well[place]-Glucosamine-Vit D (OSTEO BI-FLEX ONE PER DAY PO) Take 1 tablet by mouth daily.  . Cholecalciferol (VITAMIN D-3) 125 MCG (5000 UT) TABS Take 1 tablet by mouth daily.  . clindamycin (CLEOCIN) 300 MG capsule Take 2 capsules (600mg ) 1 hour prior to all dental visits.  . clopidogrel (PLAVIX) 75 MG tablet Take 1 tablet (75 mg total) by mouth daily.  . Lactobacillus (PROBIOTIC ACIDOPHILUS PO) Take 1 tablet by mouth daily.  Marland Kitchen LORazepam (ATIVAN) 0.5 MG tablet Take 0.5 mg by mouth at bedtime.   Marland Kitchen MAGNESIUM-OXIDE 400 (241.3 Mg) MG tablet TAKE 1 TABLET BY MOUTH EVERY DAY  . Menatetrenone (VITAMIN K2) 100 MCG TABS Take 100 mcg by mouth daily.  . Multiple Vitamins-Minerals (MULTIVITAMIN WITH MINERALS) tablet Take 1 tablet by mouth daily.  . rosuvastatin (CRESTOR) 10 MG tablet Take 1 tablet (10 mg total) by mouth at bedtime.  . sertraline (ZOLOFT) 100 MG tablet Take 150 mg by mouth daily.   . traMADol (ULTRAM) 50 MG tablet Take 1 tablet (50 mg total) by mouth every 6 (six) hours as needed.  . vitamin B-12 (CYANOCOBALAMIN) 1000 MCG tablet Take 1,000 mcg by mouth daily.  . [DISCONTINUED] HYDROcodone-acetaminophen (NORCO/VICODIN) 5-325 MG tablet Take 1 tablet by mouth every 6 (six) hours as needed for moderate pain or severe pain.  . [DISCONTINUED] lactose free nutrition (BOOST) LIQD Take 237 mLs by mouth 3 (three) times daily.   . [DISCONTINUED] Polyethylene Glycol 3350 (MIRALAX PO) Take 17 g by mouth daily.      Allergies:   Penicillins and Codeine   Social History   Socioeconomic History  . Marital status: Widowed    Spouse name: Not on file  . Number of children: Not on file  . Years of education: Not on file  . Highest education  level: Not on file  Occupational History  . Occupation: retired  Tobacco Use  . Smoking status: Never Smoker  . Smokeless tobacco: Never Used  Vaping Use  . Vaping Use: Never used  Substance and Sexual Activity  . Alcohol use: No  . Drug use: No  . Sexual activity: Not on file  Other Topics Concern  . Not on file  Social History Narrative  . Not on file   Social Determinants of Health   Financial Resource Strain:   . Difficulty of Paying Living Expenses: Not on file  Food Insecurity:   . Worried About Charity fundraiser in the Last Year: Not on file  . Ran Out of Food in the Last Year: Not on file  Transportation Needs:   . Lack of Transportation (Medical): Not on file  . Lack of Transportation (Non-Medical): Not on file  Physical Activity:   . Days of Exercise per Week: Not on file  . Minutes of Exercise per Session: Not  on file  Stress:   . Feeling of Stress : Not on file  Social Connections:   . Frequency of Communication with Friends and Family: Not on file  . Frequency of Social Gatherings with Friends and Family: Not on file  . Attends Religious Services: Not on file  . Active Member of Clubs or Organizations: Not on file  . Attends Archivist Meetings: Not on file  . Marital Status: Not on file     Family History: The patient's family history includes Cancer in her sister.  ROS:   Please see the history of present illness.    All other systems reviewed and are negative.  EKGs/Labs/Other Studies Reviewed:    The following studies were reviewed today: Echocardiogram 09-13-2019: FINDINGS  Left Ventricle: Left ventricular ejection fraction, by estimation, is 60  to 65%. The left ventricle has normal function. The left ventricle has no  regional wall motion abnormalities. The left ventricular internal cavity  size was normal in size. There is  no left ventricular hypertrophy. Left ventricular diastolic parameters  are consistent with Grade I  diastolic dysfunction (impaired relaxation).  Elevated left ventricular end-diastolic pressure.   Right Ventricle: The right ventricular size is normal. No increase in  right ventricular wall thickness. Right ventricular systolic function is  normal. Tricuspid regurgitation signal is inadequate for assessing PA  pressure.   Left Atrium: Left atrial size was moderately dilated.   Right Atrium: Right atrial size was normal in size.   Pericardium: There is no evidence of pericardial effusion.   Mitral Valve: The mitral valve is degenerative in appearance. Normal  mobility of the mitral valve leaflets. Mild mitral annular calcification.  No evidence of mitral valve regurgitation. No evidence of mitral valve  stenosis.   Tricuspid Valve: The tricuspid valve is normal in structure. Tricuspid  valve regurgitation is not demonstrated. No evidence of tricuspid  stenosis.   Aortic Valve: The aortic valve has been repaired/replaced. Aortic valve  regurgitation is trivial. Aortic valve mean gradient measures 9.0 mmHg.  Aortic valve peak gradient measures 16.3 mmHg. Aortic valve area, by VTI  measures 1.99 cm. There is a 23 mm  Edwards Sapien prosthetic, stented (TAVR) valve present in the aortic  position.   Pulmonic Valve: The pulmonic valve was normal in structure. Pulmonic valve  regurgitation is not visualized. No evidence of pulmonic stenosis.   Aorta: The aortic root is normal in size and structure and aortic  dilatation noted. There is mild dilatation of the ascending aorta and of  the aortic root measuring 45 mm and 17mm respectively.   Venous: The inferior vena cava is normal in size with greater than 50%  respiratory variability, suggesting right atrial pressure of 3 mmHg.   IAS/Shunts: No atrial level shunt detected by color flow Doppler.   EKG:  EKG is not ordered today.   Recent Labs: 08/03/2019: B Natriuretic Peptide 132.4 09/19/2019: ALT 33 09/21/2019: Magnesium  2.1 10/03/2019: BUN 30; Creatinine, Ser 1.06; Potassium 4.8; Sodium 137 10/05/2019: Hemoglobin 10.4; Platelets 347  Recent Lipid Panel No results found for: CHOL, TRIG, HDL, CHOLHDL, VLDL, LDLCALC, LDLDIRECT  Physical Exam:    VS:  BP (!) 144/78   Pulse 72   Ht 5' (1.524 m)   Wt 111 lb 3.2 oz (50.4 kg)   SpO2 97%   BMI 21.72 kg/m     Wt Readings from Last 3 Encounters:  03/17/20 111 lb 3.2 oz (50.4 kg)  12/11/19 104 lb (47.2 kg)  10/06/19 103 lb 9.9 oz (47 kg)     GEN:  Well nourished, well developed pleasant elderly woman in no acute distress HEENT: Normal NECK: No JVD; No carotid bruits LYMPHATICS: No lymphadenopathy CARDIAC: RRR, soft systolic ejection murmur at the right upper sternal border RESPIRATORY:  Clear to auscultation without rales, wheezing or rhonchi  ABDOMEN: Soft, non-tender, non-distended MUSCULOSKELETAL:  No edema; No deformity  SKIN: Warm and dry NEUROLOGIC:  Alert and oriented x 3 PSYCHIATRIC:  Normal affect   ASSESSMENT:    1. Thoracic aortic aneurysm without rupture ([place])   2. S/P TAVR (transcatheter aortic valve replacement)   3. Atherosclerosis of native artery of right leg with ulceration of ankle ([place])   4. Essential hypertension    PLAN:    In order of problems listed above:  1. The patient's CT scanning of the chest at the time of her TAVR evaluation demonstrated a 4.4 cm ascending aorta.  She is having no symptoms.  She really does not want to have more CAT scans considering her advanced age.  I think we will be able to visualize this well enough on her 88-month TAVR echo study.  We will follow. 2. Doing well, normal exam.  Last echo reviewed.  54-month echocardiogram will be scheduled per protocol.  She will see Nell Range at that time.  Importance of SBE prophylaxis is reviewed with her. 3. The patient has healed well after endovascular treatment.  She is maintained on dual antiplatelet therapy with aspirin and clopidogrel. 4. Blood  pressure is controlled on amlodipine.   Medication Adjustments/Labs and Tests Ordered: Current medicines are reviewed at length with the patient today.  Concerns regarding medicines are outlined above.  No orders of the defined types were placed in this encounter.  No orders of the defined types were placed in this encounter.   Patient Instructions  Medication Instructions:  Your provider recommends that you continue on your current medications as directed. Please refer to the Current Medication list given to you today.   *If you need a refill on your cardiac medications before your next appointment, please call your pharmacy*  Follow-Up: You are due for your one year TAVR echo and visit with Nell Range in February, 2022.       Signed, Sherren Mocha, MD  03/17/2020 11:25 AM    [place]

## 2020-03-17 NOTE — Patient Instructions (Signed)
Medication Instructions:  Your provider recommends that you continue on your current medications as directed. Please refer to the Current Medication list given to you today.   *If you need a refill on your cardiac medications before your next appointment, please call your pharmacy*  Follow-Up: You are due for your one year TAVR echo and visit with Nell Range in February, 2022.

## 2020-03-20 ENCOUNTER — Encounter (HOSPITAL_COMMUNITY): Payer: Self-pay | Admitting: Physical Therapy

## 2020-03-20 NOTE — Therapy (Signed)
[facility] [place], [name], [address] Phone: [phone]   Fax:  [phone]  Patient Details  Name: Michaela Simpson MRN: [id number] Date of Birth: 10-Dec-1931 Referring Provider:  No ref. provider found  Encounter Date: 03/20/2020    PHYSICAL THERAPY DISCHARGE SUMMARY  Visits from Start of Care: 66  Current functional level related to goals / functional outcomes: Wound is smaller   Remaining deficits: Continues to have a wound   Education / Equipment: HEP Plan: Patient agrees to discharge.  Patient goals were partially met. Patient is being discharged due to a change in medical status.  ?????     Rayetta Humphrey, PT CLT [phone] 03/20/2020, 1:03 PM  [facility] [address] [place], [name], [address] Phone: [phone]   Fax:  [phone]

## 2020-05-25 ENCOUNTER — Other Ambulatory Visit: Payer: Self-pay | Admitting: Surgery

## 2020-06-12 DIAGNOSIS — Z23 Encounter for immunization: Secondary | ICD-10-CM | POA: Diagnosis not present

## 2020-07-24 ENCOUNTER — Ambulatory Visit (INDEPENDENT_AMBULATORY_CARE_PROVIDER_SITE_OTHER)
Admission: RE | Admit: 2020-07-24 | Discharge: 2020-07-24 | Disposition: A | Discharge status: Home / Self Care | Payer: Medicare Other | Source: Ambulatory Visit | Attending: Vascular Surgery | Admitting: Vascular Surgery

## 2020-07-24 ENCOUNTER — Other Ambulatory Visit: Payer: Self-pay | Admitting: Surgery

## 2020-07-24 ENCOUNTER — Other Ambulatory Visit: Payer: Self-pay

## 2020-07-24 ENCOUNTER — Ambulatory Visit (HOSPITAL_COMMUNITY)
Admission: RE | Admit: 2020-07-24 | Discharge: 2020-07-24 | Disposition: A | Discharge status: Home / Self Care | Payer: Medicare Other | Source: Ambulatory Visit | Attending: Vascular Surgery | Admitting: Vascular Surgery

## 2020-07-24 ENCOUNTER — Ambulatory Visit (INDEPENDENT_AMBULATORY_CARE_PROVIDER_SITE_OTHER): Payer: Medicare Other | Admitting: Physician Assistant

## 2020-07-24 VITALS — BP 128/81 | HR 74 | Temp 98.4°F | Resp 14 | Ht 62.0 in | Wt 114.0 lb

## 2020-07-24 DIAGNOSIS — I70233 Atherosclerosis of native arteries of right leg with ulceration of ankle: Secondary | ICD-10-CM

## 2020-07-24 DIAGNOSIS — I739 Peripheral vascular disease, unspecified: Secondary | ICD-10-CM | POA: Diagnosis not present

## 2020-07-24 NOTE — Progress Notes (Signed)
Office Note     CC:  follow up Requesting Provider:  Celene Squibb, MD  HPI: Michaela Simpson is a 87 y.o. ([birth year]) female who presents for follow up of her peripheral vascular disease with hx of right lower extremity ulceration. At her last visit on December 11, 2019, her pre-tibial ulcer had very nearly healed and she had normal ABIs and toe pressures.  Her stents were patent.   Her daughter accompanies her today.  She denies claudication or rest pain.  She is s/p abdominal aortogram, bilateral lower extremity runoff. Stent of right superficial femoral- popliteal artery. Mechanical Thrombectomy of the right superficial femoral -popliteal artery using the penumbra CAT 6 device. Mynx closure device of left femoral artery 05/08/2019 by Dr. Trula Slade for non healing right leg ulceration.  The ptison a statin for cholesterol management.  The ptison a daily aspirin. Other AC: Plavix The ptis on CCBfor hypertension.  The ptis notdiabetic. Tobacco OJ:[id number]  Past Medical History:  Diagnosis Date  . AKI (acute kidney injury) ([place])   . Anemia   . Aortic stenosis   . Bruising    large bruise to left neck and T/O body  . Depression   . Early cataract   . Headache   . Hip fracture ([place])   . HTN (hypertension)   . Humerus fracture   . Leukocytosis   . Osteonecrosis ([place])    post-traumatic osteonecrosis of the femoral head of the left hip, retained orthopedic hardware ( order for conset)  . Osteoporosis   . Peripheral vascular disease ([place])    blood clot right leg  . Rheumatoid arthritis ([place])   . Thoracic ascending aortic aneurysm ([facility])    4.4 cm by 07/26/19 CTA  . Ulcer of lower extremity ([place])   . Wears glasses     Past Surgical History:  Procedure Laterality Date  . ABDOMINAL AORTOGRAM W/LOWER EXTREMITY N/A 05/08/2019   Procedure: ABDOMINAL AORTOGRAM W/LOWER EXTREMITY;  Surgeon: Serafina Mitchell, MD;  Location: [place] [facility];  Service: Cardiovascular;   Laterality: N/A;  . HARDWARE REMOVAL Left 10/02/2019   Procedure: HARDWARE REMOVAL;  Surgeon: Mcarthur Rossetti, MD;  Location: [place];  Service: Orthopedics;  Laterality: Left;  . HIP PINNING,CANNULATED Left 03/09/2019   Procedure: CANNULATED HIP PINNING;  Surgeon: Mcarthur Rossetti, MD;  Location: [place];  Service: Orthopedics;  Laterality: Left;  . INTRAMEDULLARY (IM) NAIL INTERTROCHANTERIC Right 03/14/2015   Procedure: RIGHT INTERTROCHANTRIC INTRAMEDULLARY (IM) NAIL ;  Surgeon: Mcarthur Rossetti, MD;  Location: [place];  Service: Orthopedics;  Laterality: Right;  . PERIPHERAL VASCULAR INTERVENTION Right 05/08/2019   Procedure: PERIPHERAL VASCULAR INTERVENTION;  Surgeon: Serafina Mitchell, MD;  Location: [place] [facility];  Service: Cardiovascular;  Laterality: Right;  . RIGHT/LEFT HEART CATH AND CORONARY ANGIOGRAPHY N/A 07/19/2019   Procedure: RIGHT/LEFT HEART CATH AND CORONARY ANGIOGRAPHY;  Surgeon: Sherren Mocha, MD;  Location: [place] [facility];  Service: Cardiovascular;  Laterality: N/A;  . TEE WITHOUT CARDIOVERSION N/A 08/07/2019   Procedure: TRANSESOPHAGEAL ECHOCARDIOGRAM (TEE);  Surgeon: Sherren Mocha, MD;  Location: [place] [facility];  Service: Open Heart Surgery;  Laterality: N/A;  . TOTAL HIP ARTHROPLASTY Left 10/02/2019   Procedure: LEFT TOTAL HIP ARTHROPLASTY ANTERIOR APPROACH AND REMOVAL OF CANNULATED SCREWS OF LEFT HIP;  Surgeon: Mcarthur Rossetti, MD;  Location: [place];  Service: Orthopedics;  Laterality: Left;  . TRANSCATHETER AORTIC VALVE REPLACEMENT, TRANSFEMORAL  08/07/2019  . TRANSCATHETER AORTIC VALVE REPLACEMENT, TRANSFEMORAL N/A 08/07/2019  Procedure: TRANSCATHETER AORTIC VALVE REPLACEMENT, TRANSFEMORAL;  Surgeon: Sherren Mocha, MD;  Location: [place] [facility];  Service: Open Heart Surgery;  Laterality: N/A;    Social History   Socioeconomic History  . Marital status: Widowed    Spouse name: Not on file  . Number of children: Not on file  .  Years of education: Not on file  . Highest education level: Not on file  Occupational History  . Occupation: retired  Tobacco Use  . Smoking status: Never Smoker  . Smokeless tobacco: Never Used  Vaping Use  . Vaping Use: Never used  Substance and Sexual Activity  . Alcohol use: No  . Drug use: No  . Sexual activity: Not on file  Other Topics Concern  . Not on file  Social History Narrative  . Not on file   Social Determinants of Health   Financial Resource Strain: Not on file  Food Insecurity: Not on file  Transportation Needs: Not on file  Physical Activity: Not on file  Stress: Not on file  Social Connections: Not on file  Intimate Partner Violence: Not on file   Family History  Problem Relation Age of Onset  . Cancer Sister     Current Outpatient Medications  Medication Sig Dispense Refill  . acetaminophen (TYLENOL) 325 MG tablet Take 2 tablets (650 mg total) by mouth every 6 (six) hours as needed for mild pain (or Fever >/= 101).    Marland Kitchen amLODipine (NORVASC) 2.5 MG tablet Take 1 tablet (2.5 mg total) by mouth at bedtime. 30 tablet 0  . aspirin EC 81 MG tablet Take 81 mg by mouth daily.    . Boswellia-Glucosamine-Vit D (OSTEO BI-FLEX ONE PER DAY PO) Take 1 tablet by mouth daily.    . Cholecalciferol (VITAMIN D-3) 125 MCG (5000 UT) TABS Take 1 tablet by mouth daily. 90 tablet 3  . clindamycin (CLEOCIN) 300 MG capsule Take 2 capsules (600mg ) 1 hour prior to all dental visits. 4 capsule 11  . clopidogrel (PLAVIX) 75 MG tablet TAKE 1 TABLET(75 MG) BY MOUTH DAILY 30 tablet 6  . Lactobacillus (PROBIOTIC ACIDOPHILUS PO) Take 1 tablet by mouth daily.    Marland Kitchen LORazepam (ATIVAN) 0.5 MG tablet Take 0.5 mg by mouth at bedtime.     Marland Kitchen MAGNESIUM-OXIDE 400 (241.3 Mg) MG tablet TAKE 1 TABLET BY MOUTH EVERY DAY 90 tablet 11  . Menatetrenone (VITAMIN K2) 100 MCG TABS Take 100 mcg by mouth daily. 90 tablet 3  . Multiple Vitamins-Minerals (MULTIVITAMIN WITH MINERALS) tablet Take 1 tablet by  mouth daily.    . rosuvastatin (CRESTOR) 10 MG tablet Take 1 tablet (10 mg total) by mouth at bedtime. 90 tablet 2  . sertraline (ZOLOFT) 100 MG tablet Take 150 mg by mouth daily.     . traMADol (ULTRAM) 50 MG tablet Take 1 tablet (50 mg total) by mouth every 6 (six) hours as needed. 40 tablet 0  . vitamin B-12 (CYANOCOBALAMIN) 1000 MCG tablet Take 1,000 mcg by mouth daily.     No current facility-administered medications for this visit.    Allergies  Allergen Reactions  . Penicillins Nausea And Vomiting and Other (See Comments)    Child hood allergy - Flu symptoms; constant vomiting  Did it involve swelling of the face/tongue/throat, SOB, or low BP? Unknown Did it involve sudden or severe rash/hives, skin peeling, or any reaction on the inside of your mouth or nose? Unknown Did you need to seek medical attention at a hospital or  doctor's office? Unknown When did it last happen?50+ years If all above answers are "NO", may proceed with cephalosporin use.   . Codeine Nausea And Vomiting and Rash    INTOLERANCE >  VOMITING     REVIEW OF SYSTEMS:   [X]  denotes positive finding, [ ]  denotes negative finding Cardiac  Comments:  Chest pain or chest pressure:    Shortness of breath upon exertion:    Short of breath when lying flat:    Irregular heart rhythm:        Vascular    Pain in calf, thigh, or hip brought on by ambulation:    Pain in feet at night that wakes you up from your sleep:     Blood clot in your veins:    Leg swelling:         Pulmonary    Oxygen at home:    Productive cough:     Wheezing:         Neurologic    Sudden weakness in arms or legs:     Sudden numbness in arms or legs:     Sudden onset of difficulty speaking or slurred speech:    Temporary loss of vision in one eye:     Problems with dizziness:         Gastrointestinal    Blood in stool:     Vomited blood:         Genitourinary    Burning when urinating:     Blood in urine:         Psychiatric    Major depression:         Hematologic    Bleeding problems:    Problems with blood clotting too easily:        Skin    Rashes or ulcers:        Constitutional    Fever or chills:      PHYSICAL EXAMINATION: Vitals:   07/24/20 1504  BP: 128/81  Pulse: 74  Resp: 14  Temp: 98.4 F (36.9 C)  SpO2: 98%    General:  WDWN in NAD; vital signs documented above Gait: unaided; no ataxia HENT: WNL, normocephalic Pulmonary: normal non-labored breathing , without Rales, rhonchi,  wheezing Cardiac: regular HR, without  Murmurs without carotid bruit Abdomen: soft, NT, no masses Skin: without rashes Vascular Exam/Pulses: 2+ radial, femoral, DP pulses bilaterally Extremities: without ischemic changes, without Gangrene , without cellulitis; without open wounds;  Musculoskeletal: no muscle wasting or atrophy  Neurologic: A&O X 3;  No focal weakness or paresthesias are detected Psychiatric:  The pt has Normal affect.      Non-Invasive Vascular Imaging:   07/24/2020 ABI/TBIToday's ABIToday's TBIPrevious ABIPrevious TBI  +-------+-----------+-----------+------------+------------+  Right 1.16    0.76    1.30    0.81      +-------+-----------+-----------+------------+------------+  Left  1.05    0.60    1.09    0.69      +-------+-----------+-----------+------------+------------+  Right great toe pressure 122, waveforms are triphasic Left great toe pressure 97, waveforms are triphasic   Bilateral ABIs and TBIs appear essentially unchanged compared to prior  study on 12/11/2019.    RLE arterial duplex: Right: Widely patent superficial femoral to popliteal artery stent without  evidence of stenosis.    ASSESSMENT/PLAN:  87 y.o. female here for follow up for PAD.  Patent right SFA, popliteal stent.  Stable ABIs. She has palpable pedal pulses.  Her right pretibial ulcer is healed.  Discussed  continuing her aspirin,  Plavix and statin.  Continue exercise and walking program.  Vigilant skin care and advised to alert [place] for nonhealing wound, worsening leg pain, worsening lower extremity fatigue or rest pain.   Barbie Banner, PA-C [facility] [phone]  Clinic MD:   [facility] on-call

## 2020-08-06 ENCOUNTER — Other Ambulatory Visit (HOSPITAL_COMMUNITY): Payer: Medicare Other

## 2020-08-06 NOTE — Progress Notes (Unsigned)
[facility] [place]                                     Cardiology Office Note:    Date:  08/07/2020   ID:  HODA HON, DOB 09/25/1931, MRN [id number]  PCP:  Celene Squibb, MD  [facility] Cardiologist:  Sherren Mocha, MD  [facility] Electrophysiologist:  None   Referring MD: Celene Squibb, MD   CC: 1 year follow-up s/p TAVR  History of Present Illness:    Michaela Simpson is a 87 y.o. female with a history of HTN, rheumatoid arthritis,anemia, CAD, PAD with nonhealing ulcer s/p rightsuperficial femoral-popliteal arterystenting(04/2019)and severe AS s/p TAVR (08/07/19) who presents to clinic for follow up.  She was hospitalizedin 02/2019 fora mechanical fall resulting in a left hip fracture and left humerus fracture. She underwent ORIF of the hip fracture and nonoperative treatment of the humerus fracture was recommended. During her hospitalization she was noted to have a heart murmur and an echocardiogram was ordered. This demonstrated severe aortic stenosis and she is referred for outpatient consultation to review treatment options.  She was seen by Dr. Burt Knack in the office on 04/12/2019 and felt to be a good candidate for TAVR but she requested some more time to recover from her recent hip/humerus fracture. She was seen by Dr. Trula Slade for a non healing ulcer of her right leg. She underwent mechanical thrombectomyand stentingof the right superficial femoral-popliteal artery on 05/08/19. Placed on DAPT with aspirin and plavix. [facility] 07/19/19 showed severe single vessel CAD involving a moderate caliber (2 mm) first diagonal branch of the LAD, otherwise widely patent left main, LAD, LCx, and RCA as well as severe calcific AS with mean gradient 50 mmHg and AVA 0.56 square cm.  She underwent successful TAVR with a 23 mm Edwards S3U on 08/07/19. Post op echo showed EF 60% with a normally functioning TAVR with a mean  gradient of 13 mm Hg and no PVL. She was discharged on aspirin and plavix. 1 month echo showed EF 60% with normally functioning TAVR with a mean gradient of 9 mm Hg and trivial AI. 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  Today she presents to clinic for follow up. She is accompanied by her daughter. She reports feeling great. She denies any shortness of breath, swelling of her lower extremities, orthopnea, PND, or fatigue. No chest pain or palpitations. She denies any dizziness or syncope. No blood in her urine and stool.   Past Medical History:  Diagnosis Date  . Acute blood loss anemia 03/22/2015  . Acute renal failure syndrome ([place])   . AKI (acute kidney injury) ([place])   . Anemia   . Aortic stenosis   . Bilateral carpal tunnel syndrome 05/15/2018  . Bruising    large bruise to left neck and T/O body  . Chronic constipation 03/15/2019  . Closed left humeral fracture 03/08/2019  . Closed right hip fracture ([place]) 03/14/2015  . Depression   . Early cataract   . Fracture of femoral neck, left, closed ([place]) 03/08/2019  . Headache   . Hip fracture ([place])   . HTN (hypertension)   . Humerus fracture   . Leukocytosis   . Major depression,  recurrent, chronic ([place]) 03/15/2019  . Osteonecrosis ([place])    post-traumatic osteonecrosis of the femoral head of the left hip, retained orthopedic hardware ( order for conset)  . Osteoporosis   . Peripheral vascular disease ([place])    blood clot right leg  . Protein-calorie malnutrition, severe 09/21/2019  . Rheumatoid arthritis ([place])   . S/P TAVR (transcatheter aortic valve replacement) 08/08/2019  . Sequelae of open wound of right lower extremity 03/15/2019  . Severe aortic stenosis 07/19/2019  . Thoracic ascending aortic aneurysm (HCC)    4.4 cm by 07/26/19 CTA  . Traumatic arthritis of left hip 10/02/2019  . Ulcer of  lower extremity ([place])   . Wears glasses     Past Surgical History:  Procedure Laterality Date  . ABDOMINAL AORTOGRAM W/LOWER EXTREMITY N/A 05/08/2019   Procedure: ABDOMINAL AORTOGRAM W/LOWER EXTREMITY;  Surgeon: Serafina Mitchell, MD;  Location: [place] [facility];  Service: Cardiovascular;  Laterality: N/A;  . HARDWARE REMOVAL Left 10/02/2019   Procedure: HARDWARE REMOVAL;  Surgeon: Mcarthur Rossetti, MD;  Location: [place];  Service: Orthopedics;  Laterality: Left;  . HIP PINNING,CANNULATED Left 03/09/2019   Procedure: CANNULATED HIP PINNING;  Surgeon: Mcarthur Rossetti, MD;  Location: [place];  Service: Orthopedics;  Laterality: Left;  . INTRAMEDULLARY (IM) NAIL INTERTROCHANTERIC Right 03/14/2015   Procedure: RIGHT INTERTROCHANTRIC INTRAMEDULLARY (IM) NAIL ;  Surgeon: Mcarthur Rossetti, MD;  Location: [place];  Service: Orthopedics;  Laterality: Right;  . PERIPHERAL VASCULAR INTERVENTION Right 05/08/2019   Procedure: PERIPHERAL VASCULAR INTERVENTION;  Surgeon: Serafina Mitchell, MD;  Location: [place] [facility];  Service: Cardiovascular;  Laterality: Right;  . RIGHT/LEFT HEART CATH AND CORONARY ANGIOGRAPHY N/A 07/19/2019   Procedure: RIGHT/LEFT HEART CATH AND CORONARY ANGIOGRAPHY;  Surgeon: Sherren Mocha, MD;  Location: [place] [facility];  Service: Cardiovascular;  Laterality: N/A;  . TEE WITHOUT CARDIOVERSION N/A 08/07/2019   Procedure: TRANSESOPHAGEAL ECHOCARDIOGRAM (TEE);  Surgeon: Sherren Mocha, MD;  Location: [place] [facility];  Service: Open Heart Surgery;  Laterality: N/A;  . TOTAL HIP ARTHROPLASTY Left 10/02/2019   Procedure: LEFT TOTAL HIP ARTHROPLASTY ANTERIOR APPROACH AND REMOVAL OF CANNULATED SCREWS OF LEFT HIP;  Surgeon: Mcarthur Rossetti, MD;  Location: [place];  Service: Orthopedics;  Laterality: Left;  . TRANSCATHETER AORTIC VALVE REPLACEMENT, TRANSFEMORAL  08/07/2019  . TRANSCATHETER AORTIC VALVE REPLACEMENT, TRANSFEMORAL N/A 08/07/2019   Procedure:  TRANSCATHETER AORTIC VALVE REPLACEMENT, TRANSFEMORAL;  Surgeon: Sherren Mocha, MD;  Location: [place] [facility];  Service: Open Heart Surgery;  Laterality: N/A;    Current Medications: Current Meds  Medication Sig  . acetaminophen (TYLENOL) 325 MG tablet Take 2 tablets (650 mg total) by mouth every 6 (six) hours as needed for mild pain (or Fever >/= 101).  Marland Kitchen amLODipine (NORVASC) 2.5 MG tablet Take 1 tablet (2.5 mg total) by mouth at bedtime.  Marland Kitchen aspirin EC 81 MG tablet Take 81 mg by mouth daily.  Marland Kitchen azithromycin (ZITHROMAX) 500 MG tablet Take 1 tablet (500 mg) one hour prior to all dental visits.  . [place]-Glucosamine-Vit D (OSTEO BI-FLEX ONE PER DAY PO) Take 1 tablet by mouth daily.  . Cholecalciferol (VITAMIN D-3) 125 MCG (5000 UT) TABS Take 1 tablet by mouth daily.  . clopidogrel (PLAVIX) 75 MG tablet TAKE 1 TABLET(75 MG) BY MOUTH DAILY  . Lactobacillus (PROBIOTIC ACIDOPHILUS PO) Take 1 tablet by mouth daily.  Marland Kitchen LORazepam (ATIVAN) 0.5 MG tablet Take 0.5 mg by mouth at bedtime.   Marland Kitchen MAGNESIUM-OXIDE 400 (  241.3 Mg) MG tablet TAKE 1 TABLET BY MOUTH EVERY DAY  . Menatetrenone (VITAMIN K2) 100 MCG TABS Take 100 mcg by mouth daily.  . Multiple Vitamins-Minerals (MULTIVITAMIN WITH MINERALS) tablet Take 1 tablet by mouth daily.  . rosuvastatin (CRESTOR) 10 MG tablet TAKE 1 TABLET(10 MG) BY MOUTH AT BEDTIME  . sertraline (ZOLOFT) 100 MG tablet Take 150 mg by mouth daily.   . traMADol (ULTRAM) 50 MG tablet Take 1 tablet (50 mg total) by mouth every 6 (six) hours as needed.  . vitamin B-12 (CYANOCOBALAMIN) 1000 MCG tablet Take 1,000 mcg by mouth daily.  . [DISCONTINUED] clindamycin (CLEOCIN) 300 MG capsule Take 2 capsules (600mg ) 1 hour prior to all dental visits.     Allergies:   Penicillins and Codeine   Social History   Socioeconomic History  . Marital status: Widowed    Spouse name: Not on file  . Number of children: Not on file  . Years of education: Not on file  . Highest education  level: Not on file  Occupational History  . Occupation: retired  Tobacco Use  . Smoking status: Never Smoker  . Smokeless tobacco: Never Used  Vaping Use  . Vaping Use: Never used  Substance and Sexual Activity  . Alcohol use: No  . Drug use: No  . Sexual activity: Not on file  Other Topics Concern  . Not on file  Social History Narrative  . Not on file   Social Determinants of Health   Financial Resource Strain: Not on file  Food Insecurity: Not on file  Transportation Needs: Not on file  Physical Activity: Not on file  Stress: Not on file  Social Connections: Not on file     Family History: The patient's family history includes Cancer in her sister.  ROS:   Please see the history of present illness.    All other systems reviewed and are negative.  EKGs/Labs/Other Studies Reviewed:    The following studies were reviewed today: TAVR OPERATIVE NOTE   Date of Procedure:08/07/2019  Preoperative Diagnosis:Severe Aortic Stenosis   Postoperative Diagnosis:Same   Procedure:   Transcatheter Aortic Valve Replacement - PercutaneousRightTransfemoral Approach Edwards Sapien 3 Ultra THV (size 62mm, model # 9750TFX, serial # [id number])  Co-Surgeons:Bryan Alveria Apley, MDand Sherren Mocha, MD   Anesthesiologist:George Kalman Shan, MD  Echocardiographer:Peter Johnsie Cancel, MD  Pre-operative Echo Findings: ? Severe aortic stenosis ? Normalleft ventricular systolic function  Post-operative Echo Findings: ? Noparavalvular leak ? Normalleft ventricular systolic function  __________________  Echo 08/08/19 IMPRESSIONS  1. Left ventricular ejection fraction, by estimation, is 60 to 65%. The  left ventricle has normal function. The left ventricle has no regional  wall motion abnormalities. Left ventricular diastolic parameters are  consistent with Grade I diastolic  dysfunction (impaired relaxation).  2. Right ventricular systolic function is normal. The right ventricular  size is normal.  3. Right atrial size was mildly dilated.  4. The mitral valve is degenerative. No evidence of mitral valve regurgitation.  5. Post TAVR with 23 mm Sapien 3 Ultra valve. Good position with no  significant PVL Compared to implant now post op day one peak velocity 1.5  m/sec->2.4, mean gradient 5 mmHg ->13 mmHg and AVA 2.0 cm2 -> 1.6 cm2  Current DVI is normal at 0.51 . The  aortic valve has been repaired/replaced. Aortic valve regurgitation is not visualized. There is a 23 mm Edwards Sapien prosthetic (TAVR) valve present in the aortic position. Procedure Date: 08/07/2019.  6. Pulmonic valve regurgitation not  assessed.  7. Aortic dilatation noted. There is dilatation of the aortic root  measuring 42 mm.   ________________  Echo 09/13/19 IMPRESSIONS 1. Left ventricular ejection fraction, by estimation, is 60 to 65%. The left ventricle has normal function. The left ventricle has no regional wall motion abnormalities. Left ventricular diastolic parameters are consistent with Grade I diastolic  dysfunction (impaired relaxation). Elevated left ventricular end-diastolic pressure. 2. Right ventricular systolic function is normal. The right ventricular size is normal. Tricuspid regurgitation signal is inadequate for assessing PA pressure. 3. Left atrial size was moderately dilated. 4. The mitral valve is degenerative. Mild mitral annular calcification. No evidence of mitral valve regurgitation. No evidence of mitral stenosis. 5. The aortic valve has been repaired/replaced. There is a 23 mm Edwards Sapien prosthetic, stented (TAVR) valve present in the aortic position. Aortic valve mean gradient measures 9.0 mmHg. Aortic valve peak gradient measures 16.3 mmHg. Aortic valve  area, by VTI measures 1.99 cm. DI is 0.57. There is trivial perivalvular AI. 6. Aortic  dilatation noted. There is mild dilatation of the ascending aorta and of the aortic root measuring 45 mm and 4mm respectively. 7. The inferior vena cava is normal in size with greater than 50% respiratory variability, suggesting right atrial pressure of 3 mmHg. 8. Compared to prior echo 08/08/2019, the transvalvular aortic peak/mean gradients have improved from 23.6/13 to 16.3/52mmHg on current study. The ascending aortic measurement of 22mm is similar to findings on Chest CTA 07/2019 which measured 36mm  ___________ Echo 08/07/2020 IMPRESSIONS  1. Left ventricular ejection fraction, by estimation, is 60 to 65%. The left ventricle has normal function. The left ventricle has no regional wall motion abnormalities. Left ventricular diastolic parameters are indeterminate.  2. Right ventricular systolic function is normal. The right ventricular size is normal.  3. The mitral valve is normal in structure. Trivial mitral valve regurgitation.  4. S/p TAVR (23 mm Edwards Sapien valve prosthesis (procedure date: 08/07/19). Peak and mean gradients through the valve are 15 and 8 mm Hg respectively. No significant change from report of 09/13/19. . The aortic valve has been repaired/replaced. Aortic  valve regurgitation is not visualized.  5. Aorta measures slightly smaller than from study of 09/13/19. There is mild dilatation of the ascending aorta, measuring 42 mm.  6. The inferior vena cava is normal in size with greater than 50% respiratory variability, suggesting right atrial pressure of 3 mmHg.   Recent Labs: 09/19/2019: ALT 33 09/21/2019: Magnesium 2.1 10/03/2019: BUN 30; Creatinine, Ser 1.06; Potassium 4.8; Sodium 137 10/05/2019: Hemoglobin 10.4; Platelets 347  Recent Lipid Panel No results found for: CHOL, TRIG, HDL, CHOLHDL, VLDL, LDLCALC, LDLDIRECT   Risk Assessment/Calculations:       Physical Exam:    VS:  BP (!) 158/80   Pulse 70   Ht 5\' 2"  (1.575 m)   Wt 109 lb 9.6 oz (49.7 kg)   SpO2 98%    BMI 20.05 kg/m     Wt Readings from Last 3 Encounters:  08/07/20 109 lb 9.6 oz (49.7 kg)  07/24/20 114 lb (51.7 kg)  03/17/20 111 lb 3.2 oz (50.4 kg)     GEN: Well nourished, well developed in no acute distress HEENT: Normal NECK: No JVD; No carotid bruits LYMPHATICS: No lymphadenopathy CARDIAC: RRR, no murmurs, rubs, gallops RESPIRATORY:  Clear to auscultation without rales, wheezing or rhonchi  ABDOMEN: Soft, non-tender, non-distended MUSCULOSKELETAL:  No edema; No deformity  SKIN: Warm and dry NEUROLOGIC:  Alert and oriented x 3 PSYCHIATRIC:  Normal affect   ASSESSMENT:    1. S/P TAVR (transcatheter aortic valve replacement)   2. PAD (peripheral artery disease) ([place])   3. Essential hypertension   4. Coronary artery disease involving native heart without angina pectoris, unspecified vessel or lesion type   5. Thoracic aortic aneurysm without rupture (HCC)   6. Pulmonary nodule    PLAN:    In order of problems listed above:  Severe AS s/p TAVR: echo today shows EF 60%, normally functioning TAVR with a mean gradient of 8 mm hg and no PVL. She has NYHA class 1 symptoms. Change clindamycin to azithromycin for SBE prophylaxis given high risk of cdif with clindamycin. Continue on chronic DAPT per VVS.   PAD s/pright superficial femoral artery stenting: Continue on DAPT. Continue following with vascular.  HTN: BP initially elevated but personal recheck improved to 138/86. Patient is on amlodipine 2.5mg . No changes at this time.  CAD: She is asymptomatic. Continue medical therapy. One year follow-up  TAA:  The patient's CT scanning of the chest at the time of her TAVR evaluation demonstrated a 4.4 cm ascending aorta.  She is having no symptoms.  She really does not want to have more CAT scans considering her advanced age. Per Dr. Antionette Char last note, he thought we we will be able to visualize this well enough on her 40-month TAVR echo study. Ascending aorta measured at 4.2cm.      Pulmonary nodule: Pre TACR CT also showed a nodular 1.6 cm pulmonary opacity adjacent to healed deformity in the lateral left eighth rib, favoring posttraumatic scarring. Suggest attention on follow-up chest CT in 3 months. Repeat CT chest done on 09/19/19 did not report a pulmonary nodule. No follow-up necessary   Medication Adjustments/Labs and Tests Ordered: Current medicines are reviewed at length with the patient today.  Concerns regarding medicines are outlined above.  No orders of the defined types were placed in this encounter.  Meds ordered this encounter  Medications  . azithromycin (ZITHROMAX) 500 MG tablet    Sig: Take 1 tablet (500 mg) one hour prior to all dental visits.    Dispense:  3 tablet    Refill:  12    Patient Instructions  Medication Instructions:  1) STOP CLINDAMYCIN 2) Your provider discussed the importance of taking an antibiotic prior to all dental visits to prevent damage to the heart valves from infection. You were given a prescription for AZITHROMYCIN 500 mg to take one hour prior to any dental appointment.  *If you need a refill on your cardiac medications before your next appointment, please call your pharmacy*   Follow-Up: At [facility], you and your health needs are our priority.  As part of our continuing mission to provide you with exceptional heart care, we have created designated Provider Care Teams.  These Care Teams include your primary Cardiologist (physician) and Advanced Practice Providers (APPs -  Physician Assistants and Nurse Practitioners) who all work together to provide you with the care you need, when you need it. Your next appointment:   12 month(s) The format for your next appointment:   In Person Provider:   You may see Sherren Mocha, MD or one of the following Advanced Practice Providers on your designated Care Team:    Richardson Dopp, PA-C  Robbie Lis, PA-C      Signed, Angelena Form, [place]  08/07/2020 8:00 PM     [place]

## 2020-08-07 ENCOUNTER — Ambulatory Visit (INDEPENDENT_AMBULATORY_CARE_PROVIDER_SITE_OTHER): Payer: Medicare Other | Admitting: Physician Assistant

## 2020-08-07 ENCOUNTER — Ambulatory Visit (HOSPITAL_COMMUNITY): Payer: Medicare Other | Attending: Internal Medicine

## 2020-08-07 ENCOUNTER — Other Ambulatory Visit: Payer: Self-pay

## 2020-08-07 VITALS — BP 158/80 | HR 70 | Ht 62.0 in | Wt 109.6 lb

## 2020-08-07 DIAGNOSIS — I10 Essential (primary) hypertension: Secondary | ICD-10-CM | POA: Diagnosis not present

## 2020-08-07 DIAGNOSIS — I712 Thoracic aortic aneurysm, without rupture, unspecified: Secondary | ICD-10-CM

## 2020-08-07 DIAGNOSIS — I739 Peripheral vascular disease, unspecified: Secondary | ICD-10-CM | POA: Diagnosis not present

## 2020-08-07 DIAGNOSIS — Z952 Presence of prosthetic heart valve: Secondary | ICD-10-CM | POA: Diagnosis not present

## 2020-08-07 DIAGNOSIS — R911 Solitary pulmonary nodule: Secondary | ICD-10-CM | POA: Diagnosis not present

## 2020-08-07 DIAGNOSIS — I2510 Atherosclerotic heart disease of native coronary artery without angina pectoris: Secondary | ICD-10-CM | POA: Diagnosis not present

## 2020-08-07 DIAGNOSIS — I70233 Atherosclerosis of native arteries of right leg with ulceration of ankle: Secondary | ICD-10-CM | POA: Diagnosis not present

## 2020-08-07 LAB — ECHOCARDIOGRAM COMPLETE
AR max vel: 1.1 cm2
AV Area VTI: 1.18 cm2
AV Area mean vel: 1.11 cm2
AV Mean grad: 8 mmHg
AV Peak grad: 15.1 mmHg
Ao pk vel: 1.94 m/s
Area-P 1/2: 3.42 cm2
S' Lateral: 1.7 cm

## 2020-08-07 MED ORDER — AZITHROMYCIN 500 MG PO TABS: ORAL_TABLET | ORAL | 12 refills | Status: DC

## 2020-08-07 NOTE — Patient Instructions (Signed)
Medication Instructions:  1) STOP CLINDAMYCIN 2) Your provider discussed the importance of taking an antibiotic prior to all dental visits to prevent damage to the heart valves from infection. You were given a prescription for AZITHROMYCIN 500 mg to take one hour prior to any dental appointment.  *If you need a refill on your cardiac medications before your next appointment, please call your pharmacy*   Follow-Up: At [facility], you and your health needs are our priority.  As part of our continuing mission to provide you with exceptional heart care, we have created designated Provider Care Teams.  These Care Teams include your primary Cardiologist (physician) and Advanced Practice Providers (APPs -  Physician Assistants and Nurse Practitioners) who all work together to provide you with the care you need, when you need it. Your next appointment:   12 month(s) The format for your next appointment:   In Person Provider:   You may see Sherren Mocha, MD or one of the following Advanced Practice Providers on your designated Care Team:    Richardson Dopp, PA-C  [place], [place]

## 2020-09-08 DIAGNOSIS — H01001 Unspecified blepharitis right upper eyelid: Secondary | ICD-10-CM | POA: Diagnosis not present

## 2020-09-08 DIAGNOSIS — H01004 Unspecified blepharitis left upper eyelid: Secondary | ICD-10-CM | POA: Diagnosis not present

## 2020-09-08 DIAGNOSIS — H01002 Unspecified blepharitis right lower eyelid: Secondary | ICD-10-CM | POA: Diagnosis not present

## 2020-09-08 DIAGNOSIS — H25812 Combined forms of age-related cataract, left eye: Secondary | ICD-10-CM | POA: Diagnosis not present

## 2020-09-08 DIAGNOSIS — H35371 Puckering of macula, right eye: Secondary | ICD-10-CM | POA: Diagnosis not present

## 2020-09-08 DIAGNOSIS — H2511 Age-related nuclear cataract, right eye: Secondary | ICD-10-CM | POA: Diagnosis not present

## 2020-09-22 DIAGNOSIS — G4700 Insomnia, unspecified: Secondary | ICD-10-CM | POA: Diagnosis not present

## 2020-09-22 DIAGNOSIS — I10 Essential (primary) hypertension: Secondary | ICD-10-CM | POA: Diagnosis not present

## 2020-10-02 ENCOUNTER — Telehealth: Payer: Self-pay | Admitting: *Deleted

## 2020-10-02 NOTE — Telephone Encounter (Signed)
1 year call to patient. Completed Hoos, Brooke Bonito. Patient doing extremely well.

## 2020-10-15 NOTE — H&P (Signed)
Surgical History & Physical  Patient Name: Michaela Simpson DOB: 1932-05-11  Surgery: Cataract extraction with intraocular lens implant phacoemulsification; Left Eye  Surgeon: Baruch Goldmann MD Surgery Date:  10/24/2020 Pre-Op Date:  10/15/2020  HPI: A 88 Yr. old female patient Pt referred by Dr. Jorja Loa for cataract evaluation. The patient complains of difficulty when viewing TV, reading closed caption, news scrolls on TV, which began 3-4 years ago. Both eyes are affected. The condition's severity is worsening. The complaint is associated with glare and halos. Pt states glasses are no longer helping. Symptoms are negatively affecting pt's quality of life. No eye drop use. Denies any increase in floaters or flashes of light. HPI Completed by Dr. Baruch Goldmann  Medical History: Glaucoma Cataracts PVD OD ERM OD h/o Shingles/HSV OD Arthritis Heart Problem High Blood Pressure Polymyalgia Rheumatica  Review of Systems Negative Allergic/Immunologic Negative Cardiovascular Negative Constitutional Negative Ear, Nose, Mouth & Throat Negative Endocrine Negative Eyes Negative Gastrointestinal Negative Genitourinary Negative Hemotologic/Lymphatic Negative Integumentary Negative Musculoskeletal Negative Neurological Negative Psychiatry Negative Respiratory  Social   Never smoked   Medication Aleve, B-12, Blood pressure med., Centrum, Osteo biflez, MAG-OXIDE, AMLODIPINE BESYLATE, ROSUVASTATIN, LORAZEPAM, SERTRALINE,   Sx/Procedures  None  Drug Allergies  Codeine, Pencillin,   History & Physical: Heent:  Cataract, Left eye NECK: supple without bruits LUNGS: lungs clear to auscultation CV: regular rate and rhythm Abdomen: soft and non-tender  Impression & Plan: Assessment: 1.  COMBINED FORMS AGE RELATED CATARACT; Left Eye (H25.812) 2.  BLEPHARITIS; Right Upper Lid, Right Lower Lid, Left Upper Lid, Left Lower Lid (H01.001, H01.002,H01.004,H01.005) 3.  NUCLEAR SCLEROSIS  AGE RELATED; Right Eye (H25.11) 4.  MACULAR PUCKER; Right Eye (H35.371) 5.  ASTIGMATISM, REGULAR; Both Eyes (H52.223)  Plan: 1.  Cataract accounts for the patient's decreased vision. This visual impairment is not correctable with a tolerable change in glasses or contact lenses. Cataract surgery with an implantation of a new lens should significantly improve the visual and functional status of the patient. Discussed all risks, benefits, alternatives, and potential complications. Discussed the procedures and recovery. Patient desires to have surgery. A-scan ordered and performed today for intra-ocular lens calculations. The surgery will be performed in order to improve vision for driving, reading, and for eye examinations. Recommend phacoemulsification with intra-ocular lens. Recommend Dextenza for post-operative pain and inflammation. Left Eye. Dilates poorly - shugacaine by protocol. Malyugin Ring. Omidira. Consider Vivity Toric Lens or Toric Lens.. 2.  Recommend regular lid surgery. 3.  Visually significant - will address after OS. 4.  Mild. No diffractive multifocal. OCT macula today. 5.  Recommend Toric Lens - Eyhance or Vivity.

## 2020-10-16 NOTE — Patient Instructions (Signed)
Michaela Simpson  10/16/2020     @PREFPERIOPPHARMACY @   Your procedure is scheduled on 10/24/2020   Report to [facility] at  1130  A.M.   Call this number if you have problems the morning of surgery:  [phone]   Remember:  Do not eat or drink after midnight.                       Take these medicines the morning of surgery with A SIP OF WATER  Tramadol(if needed).     Please brush your teeth.  Do not wear jewelry, make-up or nail polish.  Do not wear lotions, powders, or perfumes, or deodorant.  Do not shave 48 hours prior to surgery.  Men may shave face and neck.  Do not bring valuables to the hospital.  [facility] is not responsible for any belongings or valuables.  Contacts, dentures or bridgework may not be worn into surgery.  Leave your suitcase in the car.  After surgery it may be brought to your room.  For patients admitted to the hospital, discharge time will be determined by your treatment team.  Patients discharged the day of surgery will not be allowed to drive home and must have someone with them for 24 hours.    Special instructions:  DO NOT smoke tobacco or vape for 24 hours before your surgery.   Please read over the following fact sheets that you were given. Care and Recovery After Surgery      Cataract Surgery, Care After This sheet gives you information about how to care for yourself after your procedure. Your health care provider may also give you more specific instructions. If you have problems or questions, contact your health care provider. What can I expect after the procedure? After the procedure, it is common to have:  Itching.  Discomfort.  Fluid discharge.  Sensitivity to light and to touch.  Bruising in or around the eye.  Mild blurred vision. Follow these instructions at home: Eye care  Do not touch or rub your eyes.  Protect your eyes as told by your health care provider. You may be told to wear a protective  eye shield or sunglasses.  Do not put a contact lens into the affected eye or eyes until your health care provider approves.  Keep the area around your eye clean and dry: ? Avoid swimming. ? Do not allow water to hit you directly in the face while showering. ? Keep soap and shampoo out of your eyes.  Check your eye every day for signs of infection. Watch for: ? Redness, swelling, or pain. ? Fluid, blood, or pus. ? Warmth. ? A bad smell. ? Vision that is getting worse. ? Sensitivity that is getting worse.   Activity  Do not drive for 24 hours if you were given a sedative during your procedure.  Avoid strenuous activities, such as playing contact sports, for as long as told by your health care provider.  Do not drive or use heavy machinery until your health care provider approves.  Do not bend or lift heavy objects. Bending increases pressure in the eye. You can walk, climb stairs, and do light household chores.  Ask your health care provider when you can return to work. If you work in a dusty environment, you may be advised to wear protective eyewear for a period of time. General instructions  Take or apply over-the-counter and prescription medicines only as  told by your health care provider. This includes eye drops.  Keep all follow-up visits as told by your health care provider. This is important. Contact a health care provider if:  You have increased bruising around your eye.  You have pain that is not helped with medicine.  You have a fever.  You have redness, swelling, or pain in your eye.  You have fluid, blood, or pus coming from your incision.  Your vision gets worse.  Your sensitivity to light gets worse. Get help right away if:  You have sudden loss of vision.  You see flashes of light or spots (floaters).  You have severe eye pain.  You develop nausea or vomiting. Summary  After your procedure, it is common to have itching, discomfort, bruising,  fluid discharge, or sensitivity to light.  Follow instructions from your health care provider about caring for your eye after the procedure.  Do not rub your eye after the procedure. You may need to wear eye protection or sunglasses. Do not wear contact lenses. Keep the area around your eye clean and dry.  Avoid activities that require a lot of effort. These include playing sports and lifting heavy objects.  Contact a health care provider if you have increased bruising, pain that does not go away, or a fever. Get help right away if you suddenly lose your vision, see flashes of light or spots, or have severe pain in the eye. This information is not intended to replace advice given to you by your health care provider. Make sure you discuss any questions you have with your health care provider. Document Revised: 04/03/2019 Document Reviewed: 12/05/2017 Elsevier Patient Education  [place].

## 2020-10-20 ENCOUNTER — Encounter (HOSPITAL_COMMUNITY): Payer: Self-pay

## 2020-10-20 ENCOUNTER — Other Ambulatory Visit: Payer: Self-pay

## 2020-10-20 ENCOUNTER — Encounter (HOSPITAL_COMMUNITY)
Admission: RE | Admit: 2020-10-20 | Discharge: 2020-10-20 | Disposition: A | Discharge status: Home / Self Care | Payer: Medicare Other | Source: Ambulatory Visit | Attending: Ophthalmology | Admitting: Ophthalmology

## 2020-10-20 DIAGNOSIS — H25812 Combined forms of age-related cataract, left eye: Secondary | ICD-10-CM | POA: Diagnosis not present

## 2020-10-22 ENCOUNTER — Other Ambulatory Visit (HOSPITAL_COMMUNITY)
Admission: RE | Admit: 2020-10-22 | Discharge: 2020-10-22 | Disposition: A | Discharge status: Home / Self Care | Payer: Medicare Other | Source: Ambulatory Visit | Attending: Ophthalmology | Admitting: Ophthalmology

## 2020-10-22 ENCOUNTER — Other Ambulatory Visit: Payer: Self-pay

## 2020-10-22 DIAGNOSIS — Z01812 Encounter for preprocedural laboratory examination: Secondary | ICD-10-CM | POA: Diagnosis not present

## 2020-10-22 DIAGNOSIS — Z20822 Contact with and (suspected) exposure to covid-19: Secondary | ICD-10-CM | POA: Insufficient documentation

## 2020-10-22 LAB — SARS CORONAVIRUS 2 (TAT 6-24 HRS): SARS Coronavirus 2: NEGATIVE

## 2020-10-24 ENCOUNTER — Ambulatory Visit (HOSPITAL_COMMUNITY): Payer: Medicare Other | Admitting: Anesthesiology

## 2020-10-24 ENCOUNTER — Encounter (HOSPITAL_COMMUNITY)
Admission: RE | Disposition: A | Discharge status: Home / Self Care | Payer: Self-pay | Source: Home / Self Care | Attending: Ophthalmology

## 2020-10-24 ENCOUNTER — Ambulatory Visit (HOSPITAL_COMMUNITY)
Admission: RE | Admit: 2020-10-24 | Discharge: 2020-10-24 | Disposition: A | Discharge status: Home / Self Care | Payer: Medicare Other | Attending: Ophthalmology | Admitting: Ophthalmology

## 2020-10-24 DIAGNOSIS — H52223 Regular astigmatism, bilateral: Secondary | ICD-10-CM | POA: Insufficient documentation

## 2020-10-24 DIAGNOSIS — Z885 Allergy status to narcotic agent status: Secondary | ICD-10-CM | POA: Insufficient documentation

## 2020-10-24 DIAGNOSIS — Z79899 Other long term (current) drug therapy: Secondary | ICD-10-CM | POA: Diagnosis not present

## 2020-10-24 DIAGNOSIS — M353 Polymyalgia rheumatica: Secondary | ICD-10-CM | POA: Insufficient documentation

## 2020-10-24 DIAGNOSIS — H35371 Puckering of macula, right eye: Secondary | ICD-10-CM | POA: Insufficient documentation

## 2020-10-24 DIAGNOSIS — Z880 Allergy status to penicillin: Secondary | ICD-10-CM | POA: Insufficient documentation

## 2020-10-24 DIAGNOSIS — H2511 Age-related nuclear cataract, right eye: Secondary | ICD-10-CM | POA: Diagnosis not present

## 2020-10-24 DIAGNOSIS — H0100A Unspecified blepharitis right eye, upper and lower eyelids: Secondary | ICD-10-CM | POA: Insufficient documentation

## 2020-10-24 DIAGNOSIS — H25812 Combined forms of age-related cataract, left eye: Secondary | ICD-10-CM | POA: Insufficient documentation

## 2020-10-24 DIAGNOSIS — H409 Unspecified glaucoma: Secondary | ICD-10-CM | POA: Insufficient documentation

## 2020-10-24 DIAGNOSIS — H0100B Unspecified blepharitis left eye, upper and lower eyelids: Secondary | ICD-10-CM | POA: Diagnosis not present

## 2020-10-24 DIAGNOSIS — F32A Depression, unspecified: Secondary | ICD-10-CM | POA: Diagnosis not present

## 2020-10-24 HISTORY — PX: CATARACT EXTRACTION W/PHACO: SHX586

## 2020-10-24 SURGERY — PHACOEMULSIFICATION, CATARACT, WITH IOL INSERTION
Anesthesia: Monitor Anesthesia Care | Site: Eye | Laterality: Left

## 2020-10-24 MED ORDER — SODIUM HYALURONATE 10 MG/ML IO SOLUTION
PREFILLED_SYRINGE | INTRAOCULAR | Status: DC | PRN
  Administered 2020-10-24: 0.85 mL via INTRAOCULAR

## 2020-10-24 MED ORDER — BSS IO SOLN
INTRAOCULAR | Status: DC | PRN
  Administered 2020-10-24: 15 mL via INTRAOCULAR

## 2020-10-24 MED ORDER — TROPICAMIDE 1 % OP SOLN
1.0000 [drp] | OPHTHALMIC | Status: AC
  Administered 2020-10-24 (×3): 1 [drp] via OPHTHALMIC

## 2020-10-24 MED ORDER — PHENYLEPHRINE-KETOROLAC 1-0.3 % IO SOLN
INTRAOCULAR | Status: AC
  Filled 2020-10-24: qty 4

## 2020-10-24 MED ORDER — EPINEPHRINE PF 1 MG/ML IJ SOLN
INTRAMUSCULAR | Status: AC
  Filled 2020-10-24: qty 1

## 2020-10-24 MED ORDER — SODIUM HYALURONATE 23MG/ML IO SOSY
PREFILLED_SYRINGE | INTRAOCULAR | Status: DC | PRN
  Administered 2020-10-24: 0.6 mL via INTRAOCULAR

## 2020-10-24 MED ORDER — PHENYLEPHRINE-KETOROLAC 1-0.3 % IO SOLN
INTRAOCULAR | Status: DC | PRN
  Administered 2020-10-24: 500 mL via OPHTHALMIC

## 2020-10-24 MED ORDER — LIDOCAINE HCL (PF) 1 % IJ SOLN
INTRAOCULAR | Status: DC | PRN
  Administered 2020-10-24: 1 mL via OPHTHALMIC

## 2020-10-24 MED ORDER — LIDOCAINE HCL 3.5 % OP GEL
1.0000 "application " | Freq: Once | OPHTHALMIC | Status: AC
  Administered 2020-10-24: 1 via OPHTHALMIC

## 2020-10-24 MED ORDER — STERILE WATER FOR IRRIGATION IR SOLN
Status: DC | PRN
  Administered 2020-10-24: 250 mL

## 2020-10-24 MED ORDER — POVIDONE-IODINE 5 % OP SOLN
OPHTHALMIC | Status: DC | PRN
  Administered 2020-10-24: 1 via OPHTHALMIC

## 2020-10-24 MED ORDER — PHENYLEPHRINE HCL 2.5 % OP SOLN
1.0000 [drp] | OPHTHALMIC | Status: AC | PRN
  Administered 2020-10-24 (×3): 1 [drp] via OPHTHALMIC

## 2020-10-24 MED ORDER — TETRACAINE HCL 0.5 % OP SOLN
1.0000 [drp] | OPHTHALMIC | Status: AC | PRN
  Administered 2020-10-24 (×3): 1 [drp] via OPHTHALMIC

## 2020-10-24 MED ORDER — NEOMYCIN-POLYMYXIN-DEXAMETH 3.5-10000-0.1 OP SUSP
OPHTHALMIC | Status: DC | PRN
  Administered 2020-10-24: 1 [drp] via OPHTHALMIC

## 2020-10-24 SURGICAL SUPPLY — 14 items
CLOTH BEACON ORANGE TIMEOUT ST (SAFETY) ×2 IMPLANT
EYE SHIELD UNIVERSAL CLEAR (GAUZE/BANDAGES/DRESSINGS) ×2 IMPLANT
GLOVE SURG UNDER POLY LF SZ6.5 (GLOVE) ×2 IMPLANT
GLOVE SURG UNDER POLY LF SZ7 (GLOVE) ×2 IMPLANT
NEEDLE HYPO 18GX1.5 BLUNT FILL (NEEDLE) ×2 IMPLANT
PAD ARMBOARD 7.5X6 YLW CONV (MISCELLANEOUS) ×2 IMPLANT
RING MALYGIN (MISCELLANEOUS) IMPLANT
RING MALYGIN 7.0 (MISCELLANEOUS) IMPLANT
SYR TB 1ML LL NO SAFETY (SYRINGE) ×2 IMPLANT
TAPE SURG TRANSPORE 1 IN (GAUZE/BANDAGES/DRESSINGS) ×1 IMPLANT
TAPE SURGICAL TRANSPORE 1 IN (GAUZE/BANDAGES/DRESSINGS) ×2
Technis 1-Piece IOL (Intraocular Lens) ×2 IMPLANT
VISCOELASTIC ADDITIONAL (OPHTHALMIC RELATED) IMPLANT
WATER STERILE IRR 250ML POUR (IV SOLUTION) ×2 IMPLANT

## 2020-10-24 NOTE — Anesthesia Preprocedure Evaluation (Signed)
Anesthesia Evaluation  Patient identified by MRN, date of birth, ID band Patient awake    Reviewed: Allergy & Precautions, NPO status , Patient's Chart, lab work & pertinent test results  History of Anesthesia Complications Negative for: history of anesthetic complications  Airway Mallampati: II  TM Distance: >3 FB     Dental  (+) Dental Advisory Given   Pulmonary    Pulmonary exam normal breath sounds clear to auscultation       Cardiovascular Exercise Tolerance: Good hypertension, Pt. on medications + Peripheral Vascular Disease  Normal cardiovascular exam+ Valvular Problems/Murmurs (TAVR) AS  Rhythm:Regular Rate:Normal     Neuro/Psych  Headaches, PSYCHIATRIC DISORDERS Depression  Neuromuscular disease    GI/Hepatic negative GI ROS, Neg liver ROS,   Endo/Other  negative endocrine ROS  Renal/GU Renal disease     Musculoskeletal  (+) Arthritis ,   Abdominal   Peds  Hematology  (+) anemia ,   Anesthesia Other Findings   Reproductive/Obstetrics                             Anesthesia Physical Anesthesia Plan  ASA: III  Anesthesia Plan: MAC   Post-op Pain Management:    Induction:   PONV Risk Score and Plan:   Airway Management Planned: Natural Airway and Nasal Cannula  Additional Equipment:   Intra-op Plan:   Post-operative Plan:   Informed Consent: I have reviewed the patients History and Physical, chart, labs and discussed the procedure including the risks, benefits and alternatives for the proposed anesthesia with the patient or authorized representative who has indicated his/her understanding and acceptance.     Dental advisory given  Plan Discussed with: CRNA and Surgeon  Anesthesia Plan Comments:         Anesthesia Quick Evaluation

## 2020-10-24 NOTE — Op Note (Signed)
Date of procedure: 10/24/20  Pre-operative diagnosis: Visually significant age-related combined cataract, Left Eye (H25.812)  Post-operative diagnosis: Visually significant age-related combined cataract, Left Eye (H25.812)  Procedure: Removal of cataract via phacoemulsification and insertion of intra-ocular lens [employer] [place]  +28.0D into the capsular bag of the Left Eye  Attending surgeon: Gerda Diss. Kiet Geer, MD, MA  Anesthesia: MAC, Topical Akten  Complications: None  Estimated Blood Loss: <54m (minimal)  Specimens: None  Implants: As above  Indications:  Visually significant age-related cataract, Left Eye  Procedure:  The patient was seen and identified in the pre-operative area. The operative eye was identified and dilated.  The operative eye was marked.  Topical anesthesia was administered to the operative eye.     The patient was then to the operative suite and placed in the supine position.  A timeout was performed confirming the patient, procedure to be performed, and all other relevant information.   The patient's face was prepped and draped in the usual fashion for intra-ocular surgery.  A lid speculum was placed into the operative eye and the surgical microscope moved into place and focused.  An inferotemporal paracentesis was created using a 20 gauge paracentesis blade.  Shugarcaine was injected into the anterior chamber.  Viscoelastic was injected into the anterior chamber.  A temporal clear-corneal main wound incision was created using a 2.437mmicrokeratome.  A continuous curvilinear capsulorrhexis was initiated using an irrigating cystitome and completed using capsulorrhexis forceps.  Hydrodissection and hydrodeliniation were performed.  Viscoelastic was injected into the anterior chamber.  A phacoemulsification handpiece and a chopper as a second instrument were used to remove the nucleus and epinucleus. The irrigation/aspiration handpiece was used to remove  any remaining cortical material.   The capsular bag was reinflated with viscoelastic, checked, and found to be intact.  The intraocular lens was inserted into the capsular bag.  The irrigation/aspiration handpiece was used to remove any remaining viscoelastic.  The clear corneal wound and paracentesis wounds were then hydrated and checked with Weck-Cels to be watertight.  The lid-speculum was removed.  The drape was removed.  The patient's face was cleaned with a wet and dry 4x4.   Maxitrol was instilled in the eye. A clear shield was taped over the eye. The patient was taken to the post-operative care unit in good condition, having tolerated the procedure well.  Post-Op Instructions: [facility] and will receive all other orders and instructions.

## 2020-10-24 NOTE — Discharge Instructions (Signed)
Please discharge patient when stable, will follow up today with Dr. Kindred Reidinger at the [facility] office immediately following discharge.  Leave shield in place until visit.  All paperwork with discharge instructions will be given at the office.  [facility] Address:  [address]  [place], [place] [address]  

## 2020-10-24 NOTE — Interval H&P Note (Signed)
History and Physical Interval Note:  10/24/2020 1:06 PM  Michaela Simpson  has presented today for surgery, with the diagnosis of Nuclear sclerotic cataract - Left eye.  The various methods of treatment have been discussed with the patient and family. After consideration of risks, benefits and other options for treatment, the patient has consented to  Procedure(s) with comments: CATARACT EXTRACTION PHACO AND INTRAOCULAR LENS PLACEMENT LEFT EYE (Left) - left as a surgical intervention.  The patient's history has been reviewed, patient examined, no change in status, stable for surgery.  I have reviewed the patient's chart and labs.  Questions were answered to the patient's satisfaction.     Baruch Goldmann

## 2020-10-24 NOTE — Anesthesia Postprocedure Evaluation (Signed)
Anesthesia Post Note  Patient: Michaela Simpson  Procedure(s) Performed: CATARACT EXTRACTION PHACO AND INTRAOCULAR LENS PLACEMENT LEFT EYE (Left Eye)  Patient location during evaluation: Phase II Anesthesia Type: MAC Level of consciousness: awake, awake and alert and oriented Pain management: satisfactory to patient Vital Signs Assessment: post-procedure vital signs reviewed and stable Respiratory status: spontaneous breathing, nonlabored ventilation and respiratory function stable Cardiovascular status: stable Postop Assessment: no apparent nausea or vomiting Anesthetic complications: no   No complications documented.   Last Vitals:  Vitals:   10/24/20 1214 10/24/20 1215  BP: (!) 153/81   Pulse: 68 67  Resp: 11 12  Temp: 36.9 C   SpO2: 99% 99%    Last Pain:  Vitals:   10/24/20 1214  PainSc: 0-No pain                 Henry Demeritt

## 2020-10-24 NOTE — Transfer of Care (Signed)
Immediate Anesthesia Transfer of Care Note  Patient: Michaela Simpson  Procedure(s) Performed: CATARACT EXTRACTION PHACO AND INTRAOCULAR LENS PLACEMENT LEFT EYE (Left Eye)  Patient Location: PACU  Anesthesia Type:MAC  Level of Consciousness: awake, alert , oriented and patient cooperative  Airway & Oxygen Therapy: Patient Spontanous Breathing  Post-op Assessment: Report given to RN and Post -op Vital signs reviewed and stable  Post vital signs: Reviewed and stable  Last Vitals:  Vitals Value Taken Time  BP    Temp    Pulse    Resp    SpO2      Last Pain:  Vitals:   10/24/20 1214  PainSc: 0-No pain         Complications: No complications documented.

## 2020-10-24 NOTE — Progress Notes (Signed)
Copies of living will and POA. Labeled and placed in chart to be scanned.

## 2020-10-27 ENCOUNTER — Encounter (HOSPITAL_COMMUNITY): Payer: Self-pay | Admitting: Ophthalmology

## 2020-11-03 DIAGNOSIS — H2511 Age-related nuclear cataract, right eye: Secondary | ICD-10-CM | POA: Diagnosis not present

## 2020-11-03 NOTE — H&P (Signed)
Surgical History & Physical  Patient Name: Michaela Simpson DOB: 17-Sep-1931  Surgery: Cataract extraction with intraocular lens implant phacoemulsification; Right Eye  Surgeon: Baruch Goldmann MD Surgery Date:  11/07/2020 Pre-Op Date:  10/30/2020  HPI: A 69 Yr. old female patient PO-OS/Pre-Op OD The patient is returning after cataract surgery. The left eye is affected. Status post cataract surgery, which began 1 week ago: Since the last visit, the affected area is doing well. The patient's vision is stable and slightly blurred per pt. Patient is following medication instructions. Combo drops TID OS. Denies any increase in floaters/flashes of light. The patient complains of difficulty when viewing TV, reading closed caption, news scrolls on TV, which began 3-4 years ago. The right eye affected. The condition's severity is worsening. The complaint is associated with glare and halos. Pt states glasses are no longer helping. Symptoms are negatively affecting pt's quality of life. No eye drop use. HPI Completed by Dr. Baruch Goldmann  Medical History: Cataracts PVD OD ERM OD h/o Shingles/HSV OD Arthritis Heart Problem High Blood Pressure Polymyalgia Rheumatica  Review of Systems Negative Allergic/Immunologic Negative Cardiovascular Negative Constitutional Negative Ear, Nose, Mouth & Throat Negative Endocrine Negative Eyes Negative Gastrointestinal Negative Genitourinary Negative Hemotologic/Lymphatic Negative Integumentary Negative Musculoskeletal Negative Neurological Negative Psychiatry Negative Respiratory  Social   Never smoked    Medication Prednisolone-gatiflox-bromfenac,  Aleve, B-12, Blood pressure med., Centrum, Osteo biflez, MAG-OXIDE, AMLODIPINE BESYLATE, ROSUVASTATIN, LORAZEPAM, SERTRALINE,   Sx/Procedures Phaco c IOL OS,   Drug Allergies  Codeine, Pencillin,   History & Physical: Heent:  Cataract, Right eye NECK: supple without bruits LUNGS: lungs clear to  auscultation CV: regular rate and rhythm Abdomen: soft and non-tender  Impression & Plan: Assessment: 1.  CATARACT EXTRACTION STATUS; Left Eye (Z98.42) 2.  INTRAOCULAR LENS IOL (Z96.1) 3.  COMBINED FORMS AGE RELATED CATARACT; RIght Eye (H25.811)  Plan: 1.  1 week after cataract surgery. Doing well with improved vision and normal eye pressure. Call with any problems or concerns. Continue Pred-Moxi-Brom 2x/day for 3 more weeks. 2.  Stable. Continue Post-op medications 3.  Right eye. Dilates poorly - shugacaine by protocol. Malyugin Ring. Omidira. Cataract accounts for the patient's decreased vision. This visual impairment is not correctable with a tolerable change in glasses or contact lenses. Cataract surgery with an implantation of a new lens should significantly improve the visual and functional status of the patient. Discussed all risks, benefits, alternatives, and potential complications. Discussed the procedures and recovery. Patient desires to have surgery. A-scan ordered and performed today for intra-ocular lens calculations. The surgery will be performed in order to improve vision for driving, reading, and for eye examinations. Recommend phacoemulsification with intra-ocular lens. Recommend Dextenza for post-operative pain and inflammation. Surgery required to correct imbalance of vision. Right Eye.

## 2020-11-04 ENCOUNTER — Other Ambulatory Visit: Payer: Self-pay

## 2020-11-04 ENCOUNTER — Encounter (HOSPITAL_COMMUNITY)
Admit: 2020-11-04 | Discharge: 2020-11-04 | Disposition: A | Discharge status: Home / Self Care | Payer: Medicare Other | Attending: Ophthalmology | Admitting: Ophthalmology

## 2020-11-04 ENCOUNTER — Encounter (HOSPITAL_COMMUNITY): Payer: Self-pay

## 2020-11-05 ENCOUNTER — Other Ambulatory Visit (HOSPITAL_COMMUNITY)
Admission: RE | Admit: 2020-11-05 | Discharge: 2020-11-05 | Disposition: A | Discharge status: Home / Self Care | Payer: Medicare Other | Source: Ambulatory Visit | Attending: Ophthalmology | Admitting: Ophthalmology

## 2020-11-05 DIAGNOSIS — Z20822 Contact with and (suspected) exposure to covid-19: Secondary | ICD-10-CM | POA: Diagnosis not present

## 2020-11-05 DIAGNOSIS — Z01812 Encounter for preprocedural laboratory examination: Secondary | ICD-10-CM | POA: Insufficient documentation

## 2020-11-05 LAB — SARS CORONAVIRUS 2 (TAT 6-24 HRS): SARS Coronavirus 2: NEGATIVE

## 2020-11-07 ENCOUNTER — Ambulatory Visit (HOSPITAL_COMMUNITY): Payer: Medicare Other | Admitting: Certified Registered"

## 2020-11-07 ENCOUNTER — Ambulatory Visit (HOSPITAL_COMMUNITY)
Admission: RE | Admit: 2020-11-07 | Discharge: 2020-11-07 | Disposition: A | Discharge status: Home / Self Care | Payer: Medicare Other | Attending: Ophthalmology | Admitting: Ophthalmology

## 2020-11-07 ENCOUNTER — Other Ambulatory Visit: Payer: Self-pay

## 2020-11-07 ENCOUNTER — Encounter (HOSPITAL_COMMUNITY): Payer: Self-pay | Admitting: Ophthalmology

## 2020-11-07 ENCOUNTER — Encounter (HOSPITAL_COMMUNITY)
Admission: RE | Disposition: A | Discharge status: Home / Self Care | Payer: Self-pay | Source: Home / Self Care | Attending: Ophthalmology

## 2020-11-07 DIAGNOSIS — Z79899 Other long term (current) drug therapy: Secondary | ICD-10-CM | POA: Insufficient documentation

## 2020-11-07 DIAGNOSIS — H2511 Age-related nuclear cataract, right eye: Secondary | ICD-10-CM | POA: Insufficient documentation

## 2020-11-07 DIAGNOSIS — H2181 Floppy iris syndrome: Secondary | ICD-10-CM | POA: Insufficient documentation

## 2020-11-07 DIAGNOSIS — Z791 Long term (current) use of non-steroidal anti-inflammatories (NSAID): Secondary | ICD-10-CM | POA: Insufficient documentation

## 2020-11-07 DIAGNOSIS — Z880 Allergy status to penicillin: Secondary | ICD-10-CM | POA: Insufficient documentation

## 2020-11-07 DIAGNOSIS — Z961 Presence of intraocular lens: Secondary | ICD-10-CM | POA: Diagnosis not present

## 2020-11-07 DIAGNOSIS — Z885 Allergy status to narcotic agent status: Secondary | ICD-10-CM | POA: Insufficient documentation

## 2020-11-07 DIAGNOSIS — I350 Nonrheumatic aortic (valve) stenosis: Secondary | ICD-10-CM | POA: Diagnosis not present

## 2020-11-07 DIAGNOSIS — Z9842 Cataract extraction status, left eye: Secondary | ICD-10-CM | POA: Diagnosis not present

## 2020-11-07 HISTORY — PX: CATARACT EXTRACTION W/PHACO: SHX586

## 2020-11-07 SURGERY — PHACOEMULSIFICATION, CATARACT, WITH IOL INSERTION
Anesthesia: Monitor Anesthesia Care | Site: Eye | Laterality: Right

## 2020-11-07 MED ORDER — PHENYLEPHRINE HCL 2.5 % OP SOLN
1.0000 [drp] | OPHTHALMIC | Status: AC | PRN
  Administered 2020-11-07 (×3): 1 [drp] via OPHTHALMIC

## 2020-11-07 MED ORDER — TROPICAMIDE 1 % OP SOLN
1.0000 [drp] | OPHTHALMIC | Status: AC
  Administered 2020-11-07 (×3): 1 [drp] via OPHTHALMIC

## 2020-11-07 MED ORDER — NEOMYCIN-POLYMYXIN-DEXAMETH 3.5-10000-0.1 OP SUSP
OPHTHALMIC | Status: DC | PRN
  Administered 2020-11-07: 2 [drp] via OPHTHALMIC

## 2020-11-07 MED ORDER — LIDOCAINE HCL (PF) 1 % IJ SOLN
INTRAOCULAR | Status: DC | PRN
  Administered 2020-11-07: 1 mL via OPHTHALMIC

## 2020-11-07 MED ORDER — TETRACAINE HCL 0.5 % OP SOLN
1.0000 [drp] | OPHTHALMIC | Status: AC | PRN
  Administered 2020-11-07 (×3): 1 [drp] via OPHTHALMIC

## 2020-11-07 MED ORDER — SODIUM HYALURONATE 10 MG/ML IO SOLUTION
PREFILLED_SYRINGE | INTRAOCULAR | Status: DC | PRN
  Administered 2020-11-07: 0.85 mL via INTRAOCULAR

## 2020-11-07 MED ORDER — EPINEPHRINE PF 1 MG/ML IJ SOLN
INTRAOCULAR | Status: DC | PRN
  Administered 2020-11-07: 500 mL

## 2020-11-07 MED ORDER — EPINEPHRINE PF 1 MG/ML IJ SOLN
INTRAMUSCULAR | Status: AC
  Filled 2020-11-07: qty 1

## 2020-11-07 MED ORDER — STERILE WATER FOR IRRIGATION IR SOLN
Status: DC | PRN
  Administered 2020-11-07: 250 mL

## 2020-11-07 MED ORDER — POVIDONE-IODINE 5 % OP SOLN
OPHTHALMIC | Status: DC | PRN
  Administered 2020-11-07: 1 via OPHTHALMIC

## 2020-11-07 MED ORDER — BSS IO SOLN
INTRAOCULAR | Status: DC | PRN
  Administered 2020-11-07: 15 mL via INTRAOCULAR

## 2020-11-07 MED ORDER — PHENYLEPHRINE-KETOROLAC 1-0.3 % IO SOLN
INTRAOCULAR | Status: AC
  Filled 2020-11-07: qty 4

## 2020-11-07 MED ORDER — LIDOCAINE HCL 3.5 % OP GEL
1.0000 "application " | Freq: Once | OPHTHALMIC | Status: AC
  Administered 2020-11-07: 1 via OPHTHALMIC

## 2020-11-07 MED ORDER — SODIUM HYALURONATE 23MG/ML IO SOSY
PREFILLED_SYRINGE | INTRAOCULAR | Status: DC | PRN
  Administered 2020-11-07: 0.6 mL via INTRAOCULAR

## 2020-11-07 SURGICAL SUPPLY — 13 items
CLOTH BEACON ORANGE TIMEOUT ST (SAFETY) ×2 IMPLANT
EYE SHIELD UNIVERSAL CLEAR (GAUZE/BANDAGES/DRESSINGS) ×2 IMPLANT
GLOVE SURG UNDER POLY LF SZ6.5 (GLOVE) ×2 IMPLANT
GLOVE SURG UNDER POLY LF SZ7 (GLOVE) ×2 IMPLANT
NEEDLE HYPO 18GX1.5 BLUNT FILL (NEEDLE) ×2 IMPLANT
PAD ARMBOARD 7.5X6 YLW CONV (MISCELLANEOUS) ×2 IMPLANT
RING MALYGIN 7.0 (MISCELLANEOUS) IMPLANT
SYR TB 1ML LL NO SAFETY (SYRINGE) ×2 IMPLANT
TAPE SURG TRANSPORE 1 IN (GAUZE/BANDAGES/DRESSINGS) ×1 IMPLANT
TAPE SURGICAL TRANSPORE 1 IN (GAUZE/BANDAGES/DRESSINGS) ×2
TECNIS 1 PIECE IOL (Intraocular Lens) ×2 IMPLANT
VISCOELASTIC ADDITIONAL (OPHTHALMIC RELATED) IMPLANT
WATER STERILE IRR 250ML POUR (IV SOLUTION) ×2 IMPLANT

## 2020-11-07 NOTE — Anesthesia Postprocedure Evaluation (Signed)
Anesthesia Post Note  Patient: Michaela Simpson  Procedure(s) Performed: CATARACT EXTRACTION PHACO AND INTRAOCULAR LENS PLACEMENT RIGHT EYE (Right Eye)  Patient location during evaluation: Phase II Anesthesia Type: MAC Level of consciousness: awake Pain management: pain level controlled Vital Signs Assessment: post-procedure vital signs reviewed and stable Respiratory status: spontaneous breathing and respiratory function stable Cardiovascular status: blood pressure returned to baseline and stable Postop Assessment: no headache and no apparent nausea or vomiting Anesthetic complications: no Comments: Late entry   No complications documented.   Last Vitals:  Vitals:   11/07/20 0954 11/07/20 1050  BP: (!) 151/72 (!) 173/83  Pulse: 62 69  Resp: 16 18  Temp: 36.7 C (!) 36.4 C  SpO2: 98% 100%    Last Pain:  Vitals:   11/07/20 1050  TempSrc: Oral  PainSc: 0-No pain                 Louann Sjogren

## 2020-11-07 NOTE — Interval H&P Note (Signed)
History and Physical Interval Note:  11/07/2020 10:21 AM  Michaela Simpson  has presented today for surgery, with the diagnosis of Nuclear sclerotic cataract - Right eye.  The various methods of treatment have been discussed with the patient and family. After consideration of risks, benefits and other options for treatment, the patient has consented to  Procedure(s) with comments: CATARACT EXTRACTION PHACO AND INTRAOCULAR LENS PLACEMENT RIGHT EYE (Right) - right, as a surgical intervention.  The patient's history has been reviewed, patient examined, no change in status, stable for surgery.  I have reviewed the patient's chart and labs.  Questions were answered to the patient's satisfaction.     Baruch Goldmann

## 2020-11-07 NOTE — Transfer of Care (Signed)
Immediate Anesthesia Transfer of Care Note  Patient: Michaela Simpson  Procedure(s) Performed: CATARACT EXTRACTION PHACO AND INTRAOCULAR LENS PLACEMENT RIGHT EYE (Right Eye)  Patient Location: Short Stay  Anesthesia Type:MAC  Level of Consciousness: awake, alert  and oriented  Airway & Oxygen Therapy: Patient Spontanous Breathing  Post-op Assessment: Report given to RN and Post -op Vital signs reviewed and stable  Post vital signs: Reviewed and stable  Last Vitals:  Vitals Value Taken Time  BP    Temp    Pulse    Resp    SpO2      Last Pain:  Vitals:   11/07/20 0954  TempSrc: Oral  PainSc: 0-No pain         Complications: No complications documented.

## 2020-11-07 NOTE — Anesthesia Procedure Notes (Signed)
Procedure Name: MAC Date/Time: 11/07/2020 10:32 AM Performed by: Orlie Dakin, CRNA Pre-anesthesia Checklist: Patient identified, Emergency Drugs available, Suction available and Patient being monitored Patient Re-evaluated:Patient Re-evaluated prior to induction Oxygen Delivery Method: Nasal cannula Placement Confirmation: positive ETCO2

## 2020-11-07 NOTE — Anesthesia Preprocedure Evaluation (Signed)
Anesthesia Evaluation  Patient identified by MRN, date of birth, ID band Patient awake    Reviewed: Allergy & Precautions, H&P , NPO status , Patient's Chart, lab work & pertinent test results, reviewed documented beta blocker date and time   Airway Mallampati: II  TM Distance: >3 FB Neck ROM: full    Dental no notable dental hx.    Pulmonary neg pulmonary ROS,    Pulmonary exam normal breath sounds clear to auscultation       Cardiovascular Exercise Tolerance: Good hypertension, + Peripheral Vascular Disease   Rhythm:regular Rate:Normal     Neuro/Psych  Headaches, PSYCHIATRIC DISORDERS Depression  Neuromuscular disease    GI/Hepatic negative GI ROS, Neg liver ROS,   Endo/Other  negative endocrine ROS  Renal/GU ARFRenal disease  negative genitourinary   Musculoskeletal   Abdominal   Peds  Hematology  (+) Blood dyscrasia, anemia ,   Anesthesia Other Findings   Reproductive/Obstetrics negative OB ROS                             Anesthesia Physical Anesthesia Plan  ASA: III  Anesthesia Plan: MAC   Post-op Pain Management:    Induction:   PONV Risk Score and Plan:   Airway Management Planned:   Additional Equipment:   Intra-op Plan:   Post-operative Plan:   Informed Consent: I have reviewed the patients History and Physical, chart, labs and discussed the procedure including the risks, benefits and alternatives for the proposed anesthesia with the patient or authorized representative who has indicated his/her understanding and acceptance.     Dental Advisory Given  Plan Discussed with: CRNA  Anesthesia Plan Comments:         Anesthesia Quick Evaluation

## 2020-11-07 NOTE — Discharge Instructions (Signed)
Please discharge patient when stable, will follow up today with Dr. Bailynn Dyk at the [facility] office immediately following discharge.  Leave shield in place until visit.  All paperwork with discharge instructions will be given at the office.  [facility] Address:  [address]  [place], [place] [address]  

## 2020-11-07 NOTE — Op Note (Signed)
Date of procedure: 11/07/20  Pre-operative diagnosis: Visually significant age-related nuclear cataract, Right Eye; Poor Dilation, Right Eye (H25.?1; H21.81)  Post-operative diagnosis: Visually significant cataract, Right Eye; Intra-operative Floppy Iris Syndrome, Right Eye  Procedure: Removal of cataract via phacoemulsification and insertion of intra-ocular lens [employer] [id number]  +26.0D into the capsular bag of the Right Eye (CPT [phone])  Attending surgeon: Gerda Diss. Ashly Goethe, MD, MA  Anesthesia: MAC, Topical Akten  Complications: None  Estimated Blood Loss: <25m (minimal)  Specimens: None  Implants: As above  Indications:  Visually significant cataract, Right Eye  Procedure:  The patient was seen and identified in the pre-operative area. The operative eye was identified and dilated.  The operative eye was marked.  Topical anesthesia was administered to the operative eye.     The patient was then to the operative suite and placed in the supine position.  A timeout was performed confirming the patient, procedure to be performed, and all other relevant information.   The patient's face was prepped and draped in the usual fashion for intra-ocular surgery.  A lid speculum was placed into the operative eye and the surgical microscope moved into place and focused.  Poor dilation of the iris was confirmed.  A superotemporal paracentesis was created using a 20 gauge paracentesis blade.  Shugarcaine was injected into the anterior chamber.  Viscoelastic was injected into the anterior chamber.  A temporal clear-corneal main wound incision was created using a 2.460mmicrokeratome.  A Malyugin ring was placed.  A continuous curvilinear capsulorrhexis was initiated using an irrigating cystitome and completed using capsulorrhexis forceps.  Hydrodissection and hydrodeliniation were performed.  Viscoelastic was injected into the anterior chamber.  A phacoemulsification handpiece and a chopper as  a second instrument were used to remove the nucleus and epinucleus. The irrigation/aspiration handpiece was used to remove any remaining cortical material.   The capsular bag was reinflated with viscoelastic, checked, and found to be intact.  The intraocular lens was inserted into the capsular bag and dialed into place using a Kuglen hook.  The Malyugin ring was removed.  The irrigation/aspiration handpiece was used to remove any remaining viscoelastic.  The clear corneal wound and paracentesis wounds were then hydrated and checked with Weck-Cels to be watertight.  The lid-speculum and drape was removed, and the patient's face was cleaned with a wet and dry 4x4.  Maxitrol was instilled in the eye before a clear shield was taped over the eye. The patient was taken to the post-operative care unit in good condition, having tolerated the procedure well.  Post-Op Instructions: [facility] and will receive all other orders and instructions.

## 2020-11-10 ENCOUNTER — Encounter (HOSPITAL_COMMUNITY): Payer: Self-pay | Admitting: Ophthalmology

## 2020-11-25 ENCOUNTER — Telehealth: Payer: Self-pay

## 2020-11-25 NOTE — Telephone Encounter (Signed)
Patient's daughter Molli Posey called and would like for Dr. Junius Roads to give her a call?  Stated that her mother told her that Dr. Junius Roads was leaving our practice, but patient is a little confused.  Please call Dawnn at [phone].  Thank you.

## 2020-11-25 NOTE — Telephone Encounter (Signed)
Called and suggested Dr. [place] as new PCP in [facility] system.

## 2020-11-25 NOTE — Telephone Encounter (Signed)
(  She has an appointment on 12/01/20 to establish primary care)

## 2020-12-01 ENCOUNTER — Ambulatory Visit: Payer: Medicare Other | Admitting: Family Medicine

## 2020-12-15 IMAGING — DX DG CHEST 1V PORT
1 series · 1 of 1 positions shown · non-contrast
Comparison: Chest radiographs 08/03/2019 and earlier.

CLINICAL DATA: 87-year-old female status post TAVR for severe
aortic stenosis. Postoperative day zero

EXAM:
PORTABLE CHEST 1 VIEW

[chest ap]
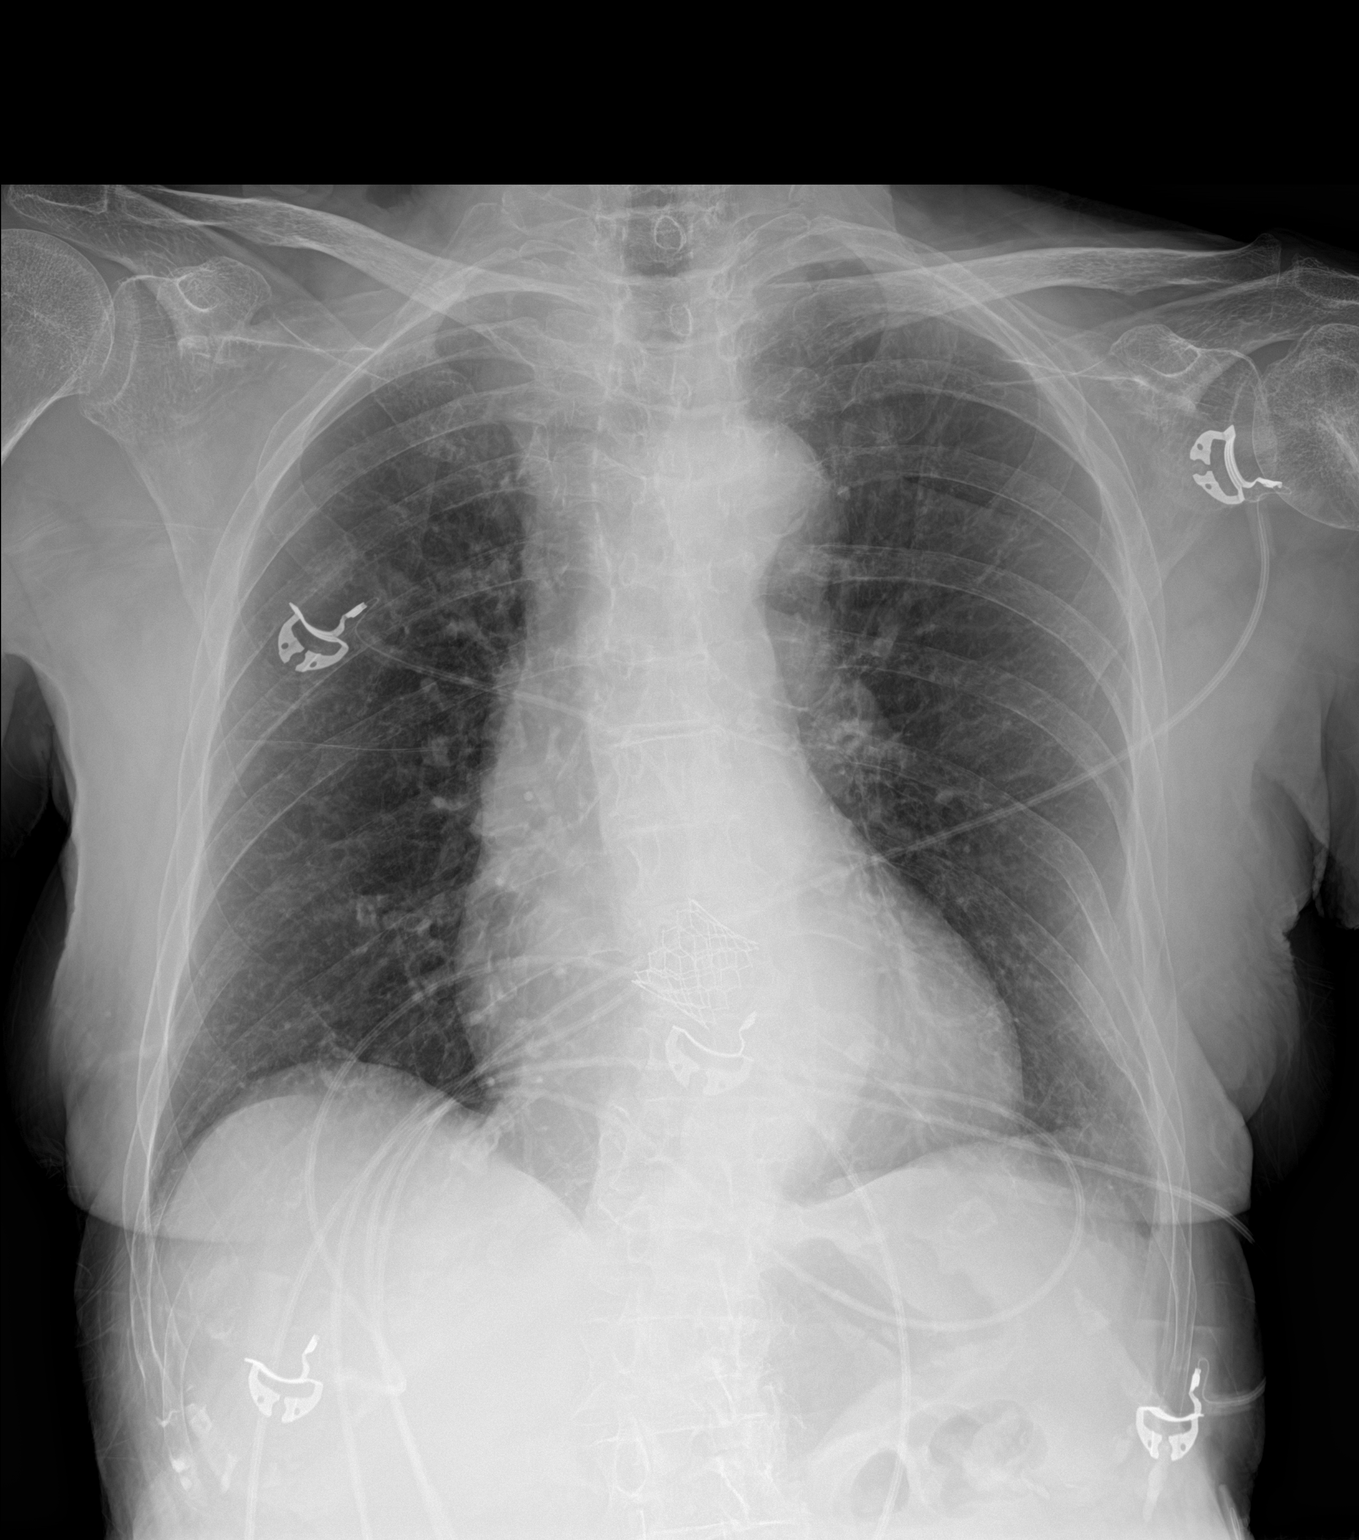

[1 of 1 positions shown; findings below may reference images not displayed]

FINDINGS: Portable AP semi upright view at 6040 hours. Sequelae of TAVR.
Stable mediastinal contours. No pneumothorax or pulmonary edema. No
pleural effusion or confluent opacity. Stable visualized osseous
structures. Negative visible bowel gas pattern.
IMPRESSION: New TAVR.  No acute cardiopulmonary abnormality.

## 2020-12-16 ENCOUNTER — Other Ambulatory Visit: Payer: Self-pay

## 2020-12-16 ENCOUNTER — Ambulatory Visit (INDEPENDENT_AMBULATORY_CARE_PROVIDER_SITE_OTHER): Payer: Medicare Other | Admitting: Family Medicine

## 2020-12-16 ENCOUNTER — Encounter (HOSPITAL_BASED_OUTPATIENT_CLINIC_OR_DEPARTMENT_OTHER): Payer: Self-pay | Admitting: Family Medicine

## 2020-12-16 DIAGNOSIS — R32 Unspecified urinary incontinence: Secondary | ICD-10-CM | POA: Insufficient documentation

## 2020-12-16 DIAGNOSIS — R040 Epistaxis: Secondary | ICD-10-CM | POA: Insufficient documentation

## 2020-12-16 DIAGNOSIS — N3941 Urge incontinence: Secondary | ICD-10-CM | POA: Diagnosis not present

## 2020-12-16 LAB — POCT URINALYSIS DIPSTICK
Bilirubin, UA: NEGATIVE
Glucose, UA: NEGATIVE
Ketones, UA: NEGATIVE
Nitrite, UA: NEGATIVE
Protein, UA: NEGATIVE
Spec Grav, UA: 1.025 (ref 1.010–1.025)
Urobilinogen, UA: 0.2 E.U./dL
pH, UA: 6 (ref 5.0–8.0)

## 2020-12-16 NOTE — Progress Notes (Signed)
New Patient Office Visit  Subjective:  Patient ID: Michaela Simpson, female    DOB: [birth year]  Age: 87 y.o. MRN: [id number]  CC:  Chief Complaint  Patient presents with   Establish Care    Prior PCP Dr. Jenny Reichmann [facility]) [place]. Pt also see [facility] - Dr. Burt Knack and Dr. Trula Slade.    Arthritis    Patient has a longstanding history of osteoarthritis. Patient has a history of 2 prior hip replacements and is followed by ortho (Dr. Rush Farmer)   Epistaxis    Patient complains of nosebleeds that are intermittent with no causation. Patient denies any trauma and states the nosebleeds come out of no where and she has to stuff cotton in her nose to get them to stop. Sometimes that can take upwards of an hour   Bladder Leakage    Patient presents with complaints of bladder incontinence. She admits to wearing adult diapers and states she has to change them 2-3 times daily. She is interested in treatment    HPI Michaela Simpson is an 87 yo female presenting to establish in clinic.  She is current concerns today related to nosebleeds as well as urinary incontinence.  Past medical history is significant for hypertension, depression.  Patient with prior surgical history of TAVR for severe aortic stenosis as well as bilateral total hip arthroplasty.  She is accompanied today by her daughter who helps in providing some of the history.  Nosebleeds: Have been going on for about 3 to 6 months.  Reports that there can be multiple weeks between episodes or she may have 2 occurrences within the same week.  Last episode was yesterday.  Whenever she does get nosebleeds, it is only in the left nostril.  Unsure if frequency is changing, possible increased frequency since this began.  She is currently on aspirin 81 mg and Plavix 75 mg due to history of stent placement and TAVR.  Denies any other bleeding issues.  When she does get her nosebleeds it can take 1 to 2 hours for bleeding to stop.  Urinary incontinence:  Patient thinks that issue began shortly after a fall in September 2019.  Since then she has had gradual progression.  Does not feel that it is associated with laughing, coughing, sneezing or anything that increases intra-abdominal pressure.  Reports that it "has a mind of its own".  Describes that she will not necessarily know much about it but then realized that she has inadvertently voided.  Sometimes she feels the urgency, other times not.  Denies any issues with abdominal pain, dysuria.  No reported change in odor, urine appearance.  Denies ever seeing any blood in the urine.  Reports that she does have coffee every day, none past 2 PM.  Does presently take Ativan every night to help with sleep.  She has been on this for some time.  There was a period of time when she attempted to stop taking it but did not tolerate this.  Past Medical History:  Diagnosis Date   Acute blood loss anemia 03/22/2015   Acute renal failure syndrome (HCC)    AKI (acute kidney injury) ([place])    Anemia    Aortic stenosis    Bilateral carpal tunnel syndrome 05/15/2018   Bruising    large bruise to left neck and T/O body   Chronic constipation 03/15/2019   Closed left humeral fracture 03/08/2019   Closed right hip fracture ([place]) 03/14/2015   Depression    Early cataract  Fracture of femoral neck, left, closed ([place]) 03/08/2019   Headache    Hip fracture (HCC)    HTN (hypertension)    Humerus fracture    Leukocytosis    Major depression, recurrent, chronic ([place]) 03/15/2019   Osteonecrosis ([place])    post-traumatic osteonecrosis of the femoral head of the left hip, retained orthopedic hardware ( order for conset)   Osteoporosis    Peripheral vascular disease ([place])    blood clot right leg   Protein-calorie malnutrition, severe 09/21/2019   Rheumatoid arthritis ([place])    S/P TAVR (transcatheter aortic valve replacement) 08/08/2019   Sequelae of open wound of right lower extremity 03/15/2019   Severe aortic stenosis  07/19/2019   Thoracic ascending aortic aneurysm (HCC)    4.4 cm by 07/26/19 CTA   Traumatic arthritis of left hip 10/02/2019   Ulcer of lower extremity ([place])    Wears glasses     Past Surgical History:  Procedure Laterality Date   ABDOMINAL AORTOGRAM W/LOWER EXTREMITY N/A 05/08/2019   Procedure: ABDOMINAL AORTOGRAM W/LOWER EXTREMITY;  Surgeon: Serafina Mitchell, MD;  Location: [place] [facility];  Service: Cardiovascular;  Laterality: N/A;   CATARACT EXTRACTION W/PHACO Left 10/24/2020   Procedure: CATARACT EXTRACTION PHACO AND INTRAOCULAR LENS PLACEMENT LEFT EYE;  Surgeon: Baruch Goldmann, MD;  Location: [facility];  Service: Ophthalmology;  Laterality: Left;  CDE  20.05   CATARACT EXTRACTION W/PHACO Right 11/07/2020   Procedure: CATARACT EXTRACTION PHACO AND INTRAOCULAR LENS PLACEMENT RIGHT EYE;  Surgeon: Baruch Goldmann, MD;  Location: [facility];  Service: Ophthalmology;  Laterality: Right;  right, CDE=24.26   HARDWARE REMOVAL Left 10/02/2019   Procedure: HARDWARE REMOVAL;  Surgeon: Mcarthur Rossetti, MD;  Location: [place];  Service: Orthopedics;  Laterality: Left;   HIP PINNING,CANNULATED Left 03/09/2019   Procedure: CANNULATED HIP PINNING;  Surgeon: Mcarthur Rossetti, MD;  Location: [place];  Service: Orthopedics;  Laterality: Left;   INTRAMEDULLARY (IM) NAIL INTERTROCHANTERIC Right 03/14/2015   Procedure: RIGHT INTERTROCHANTRIC INTRAMEDULLARY (IM) NAIL ;  Surgeon: Mcarthur Rossetti, MD;  Location: [place];  Service: Orthopedics;  Laterality: Right;   PERIPHERAL VASCULAR INTERVENTION Right 05/08/2019   Procedure: PERIPHERAL VASCULAR INTERVENTION;  Surgeon: Serafina Mitchell, MD;  Location: [place] [facility];  Service: Cardiovascular;  Laterality: Right;   RIGHT/LEFT HEART CATH AND CORONARY ANGIOGRAPHY N/A 07/19/2019   Procedure: RIGHT/LEFT HEART CATH AND CORONARY ANGIOGRAPHY;  Surgeon: Sherren Mocha, MD;  Location: [place] [facility];  Service: Cardiovascular;  Laterality: N/A;   TEE WITHOUT  CARDIOVERSION N/A 08/07/2019   Procedure: TRANSESOPHAGEAL ECHOCARDIOGRAM (TEE);  Surgeon: Sherren Mocha, MD;  Location: [place] [facility];  Service: Open Heart Surgery;  Laterality: N/A;   TOTAL HIP ARTHROPLASTY Left 10/02/2019   Procedure: LEFT TOTAL HIP ARTHROPLASTY ANTERIOR APPROACH AND REMOVAL OF CANNULATED SCREWS OF LEFT HIP;  Surgeon: Mcarthur Rossetti, MD;  Location: [place];  Service: Orthopedics;  Laterality: Left;   TRANSCATHETER AORTIC VALVE REPLACEMENT, TRANSFEMORAL  08/07/2019   TRANSCATHETER AORTIC VALVE REPLACEMENT, TRANSFEMORAL N/A 08/07/2019   Procedure: TRANSCATHETER AORTIC VALVE REPLACEMENT, TRANSFEMORAL;  Surgeon: Sherren Mocha, MD;  Location: [place] [facility];  Service: Open Heart Surgery;  Laterality: N/A;    Family History  Problem Relation Age of Onset   Cancer Sister     Social History   Socioeconomic History   Marital status: Widowed    Spouse name: Not on file   Number of children: Not on file   Years of education: Not on  file   Highest education level: Not on file  Occupational History   Occupation: retired  Tobacco Use   Smoking status: Never   Smokeless tobacco: Never  Vaping Use   Vaping Use: Never used  Substance and Sexual Activity   Alcohol use: No   Drug use: No   Sexual activity: Not on file  Other Topics Concern   Not on file  Social History Narrative   Not on file   Social Determinants of Health   Financial Resource Strain: Not on file  Food Insecurity: Not on file  Transportation Needs: Not on file  Physical Activity: Not on file  Stress: Not on file  Social Connections: Not on file  Intimate Partner Violence: Not on file    Objective:   Today's Vitals: BP (!) 170/90   Pulse 67   Ht 5' (1.524 m)   Wt 111 lb 4.8 oz (50.5 kg)   SpO2 98%   BMI 21.74 kg/m   Physical Exam  Pleasant 87 year old female in no acute distress Cardiovascular exam with regular rate and rhythm Lungs clear to auscultation  bilaterally  Assessment & Plan:   Problem List Items Addressed This Visit       Other   Urinary incontinence    Based on history, urinary incontinence is more related to urgency Discussed various measures of lifestyle therapy that can be implemented -particularly a decrease and caffeine use.  Also discussed association of benzodiazepines with urinary incontinence.  Patient did try to transition off of benzodiazepines in the past related to her insomnia but did not tolerate this Urinalysis completed today without obvious evidence of UTI Patient will proceed with above and monitor symptoms If not improving as expected, consider neck steps, possible referral to urology for further evaluation, consideration of pharmacologic management       Relevant Orders   POCT urinalysis dipstick (Completed)   Epistaxis    Likely contributed to by dual antiplatelet therapy No obvious intranasal abnormality on examination today Given relative infrequency, will continue to monitor for now If becoming more problematic in regards to frequency or severity, likely refer to ENT for further evaluation and management      Blood pressure slightly elevated in office today.  Review of prior readings reveals borderline control.  No medication changes today, continue to monitor at follow-up visits.  Outpatient Encounter Medications as of 12/16/2020  Medication Sig   acetaminophen (TYLENOL) 325 MG tablet Take 2 tablets (650 mg total) by mouth every 6 (six) hours as needed for mild pain (or Fever >/= 101).   amLODipine (NORVASC) 2.5 MG tablet Take 1 tablet (2.5 mg total) by mouth at bedtime.   aspirin EC 81 MG tablet Take 81 mg by mouth daily.   azithromycin (ZITHROMAX) 500 MG tablet Take 1 tablet (500 mg) one hour prior to all dental visits.   Boswellia-Glucosamine-Vit D (OSTEO BI-FLEX ONE PER DAY PO) Take 1 tablet by mouth daily.   Cholecalciferol (VITAMIN D-3) 125 MCG (5000 UT) TABS Take 1 tablet by mouth daily.    clopidogrel (PLAVIX) 75 MG tablet TAKE 1 TABLET(75 MG) BY MOUTH DAILY   Lactobacillus (PROBIOTIC ACIDOPHILUS PO) Take 1 tablet by mouth daily.   LORazepam (ATIVAN) 0.5 MG tablet Take 0.5 mg by mouth at bedtime.    MAGNESIUM-OXIDE 400 (241.3 Mg) MG tablet TAKE 1 TABLET BY MOUTH EVERY DAY   Menatetrenone (VITAMIN K2) 100 MCG TABS Take 100 mcg by mouth daily.   Multiple Vitamins-Minerals (MULTIVITAMIN WITH MINERALS) tablet  Take 1 tablet by mouth daily.   rosuvastatin (CRESTOR) 10 MG tablet TAKE 1 TABLET(10 MG) BY MOUTH AT BEDTIME   sertraline (ZOLOFT) 100 MG tablet Take 150 mg by mouth daily.    traMADol (ULTRAM) 50 MG tablet Take 1 tablet (50 mg total) by mouth every 6 (six) hours as needed.   vitamin B-12 (CYANOCOBALAMIN) 1000 MCG tablet Take 1,000 mcg by mouth daily.   No facility-administered encounter medications on file as of 12/16/2020.   Spent 65 minutes on this patient encounter, including preparation, chart review, face-to-face counseling with patient and coordination of care, and documentation of encounter  Follow-up: Return in about 4 weeks (around 01/13/2021) for Follow Up.  Monitor progression of urinary incontinence, nosebleeds.  Meril Dray J De [place], MD

## 2020-12-16 NOTE — Assessment & Plan Note (Signed)
Based on history, urinary incontinence is more related to urgency Discussed various measures of lifestyle therapy that can be implemented -particularly a decrease and caffeine use.  Also discussed association of benzodiazepines with urinary incontinence.  Patient did try to transition off of benzodiazepines in the past related to her insomnia but did not tolerate this Urinalysis completed today without obvious evidence of UTI Patient will proceed with above and monitor symptoms If not improving as expected, consider neck steps, possible referral to urology for further evaluation, consideration of pharmacologic management

## 2020-12-16 NOTE — Assessment & Plan Note (Signed)
Likely contributed to by dual antiplatelet therapy No obvious intranasal abnormality on examination today Given relative infrequency, will continue to monitor for now If becoming more problematic in regards to frequency or severity, likely refer to ENT for further evaluation and management

## 2020-12-16 NOTE — Patient Instructions (Signed)
  Medication Instructions:  Your physician recommends that you continue on your current medications as directed. Please refer to the Current Medication list given to you today. --If you need a refill on any your medications before your next appointment, please call your pharmacy first. If no refills are authorized on file call the office.--  Lab Work: Your physician has recommended that you have lab work today: Urinalysis If you have labs (blood work) drawn today and your tests are completely normal, you will receive your results via [place] a phone call from our staff.  Please ensure you check your voicemail in the event that you authorized detailed messages to be left on a delegated number. If you have any lab test that is abnormal or we need to change your treatment, we will call you to review the results. Follow-Up: Your next appointment:   Your physician recommends that you schedule a follow-up appointment in: 75 WEEKS with Dr. de [place]  Thanks for letting us be apart of your health journey!!  Primary Care and Sports Medicine   Dr. Arlina Robes [place]   We encourage you to activate your patient portal called "MyChart".  Sign up information is provided on this After Visit Summary.  MyChart is used to connect with patients for Virtual Visits (Telemedicine).  Patients are able to view lab/test results, encounter notes, upcoming appointments, etc.  Non-urgent messages can be sent to your provider as well. To learn more about what you can do with MyChart, please visit --  [email or web address].

## 2020-12-23 ENCOUNTER — Other Ambulatory Visit: Payer: Self-pay | Admitting: Surgery

## 2020-12-24 ENCOUNTER — Other Ambulatory Visit: Payer: Self-pay | Admitting: Vascular Surgery

## 2021-01-06 ENCOUNTER — Other Ambulatory Visit: Payer: Self-pay

## 2021-01-06 ENCOUNTER — Ambulatory Visit
Admission: EM | Admit: 2021-01-06 | Discharge: 2021-01-06 | Disposition: A | Discharge status: Home / Self Care | Payer: Medicare Other | Attending: Urgent Care | Admitting: Urgent Care

## 2021-01-06 ENCOUNTER — Ambulatory Visit (INDEPENDENT_AMBULATORY_CARE_PROVIDER_SITE_OTHER): Payer: Medicare Other

## 2021-01-06 ENCOUNTER — Encounter: Payer: Self-pay | Admitting: Emergency Medicine

## 2021-01-06 DIAGNOSIS — M79672 Pain in left foot: Secondary | ICD-10-CM

## 2021-01-06 DIAGNOSIS — Z952 Presence of prosthetic heart valve: Secondary | ICD-10-CM

## 2021-01-06 DIAGNOSIS — L03119 Cellulitis of unspecified part of limb: Secondary | ICD-10-CM

## 2021-01-06 DIAGNOSIS — M7989 Other specified soft tissue disorders: Secondary | ICD-10-CM

## 2021-01-06 MED ORDER — CEPHALEXIN 250 MG PO CAPS: 250.0000 mg | ORAL_CAPSULE | Freq: Three times a day (TID) | ORAL | 0 refills | Status: DC

## 2021-01-06 NOTE — ED Provider Notes (Signed)
[place]   MRN: [id number] DOB: May 12, 1932  Subjective:   Michaela Simpson is a 87 y.o. female presenting for 9-day history of persistent and worsening left foot discomfort, swelling and redness with bruising.  Patient reports that initially symptoms were itchy and she thought she was bit by a bug next to her great toe but cannot recall any particular incident.  She has been using warm salt water, icing without any relief.  Denies history of blood clots in her lower leg, denies fever, chest pain, shortness of breath, nausea, vomiting, decreased appetite, diaphoresis, calf pain, calf swelling, trauma, fall.  She did have a TAVR in 08/07/2019.  She is on clopidogrel, has regular follow-up with her regular doctor and cardiologist.  Has a history of rheumatoid arthritis and osteoporosis as well.  No current facility-administered medications for this encounter.  Current Outpatient Medications:    amLODipine (NORVASC) 2.5 MG tablet, Take 1 tablet (2.5 mg total) by mouth at bedtime., Disp: 30 tablet, Rfl: 0   aspirin EC 81 MG tablet, Take 81 mg by mouth daily., Disp: , Rfl:    Boswellia-Glucosamine-Vit D (OSTEO BI-FLEX ONE PER DAY PO), Take 1 tablet by mouth daily., Disp: , Rfl:    clopidogrel (PLAVIX) 75 MG tablet, TAKE 1 TABLET(75 MG) BY MOUTH DAILY, Disp: 90 tablet, Rfl: 3   Lactobacillus (PROBIOTIC ACIDOPHILUS PO), Take 1 tablet by mouth daily., Disp: , Rfl:    LORazepam (ATIVAN) 0.5 MG tablet, Take 0.5 mg by mouth at bedtime. , Disp: , Rfl:    Menatetrenone (VITAMIN K2) 100 MCG TABS, Take 100 mcg by mouth daily., Disp: 90 tablet, Rfl: 3   Multiple Vitamins-Minerals (MULTIVITAMIN WITH MINERALS) tablet, Take 1 tablet by mouth daily., Disp: , Rfl:    rosuvastatin (CRESTOR) 10 MG tablet, TAKE 1 TABLET(10 MG) BY MOUTH AT BEDTIME, Disp: 90 tablet, Rfl: 2   sertraline (ZOLOFT) 100 MG tablet, Take 150 mg by mouth daily. , Disp: , Rfl:    vitamin B-12 (CYANOCOBALAMIN) 1000 MCG  tablet, Take 1,000 mcg by mouth daily., Disp: , Rfl:    acetaminophen (TYLENOL) 325 MG tablet, Take 2 tablets (650 mg total) by mouth every 6 (six) hours as needed for mild pain (or Fever >/= 101)., Disp: , Rfl:    azithromycin (ZITHROMAX) 500 MG tablet, Take 1 tablet (500 mg) one hour prior to all dental visits., Disp: 3 tablet, Rfl: 12   Cholecalciferol (VITAMIN D-3) 125 MCG (5000 UT) TABS, Take 1 tablet by mouth daily., Disp: 90 tablet, Rfl: 3   MAGNESIUM-OXIDE 400 (241.3 Mg) MG tablet, TAKE 1 TABLET BY MOUTH EVERY DAY, Disp: 90 tablet, Rfl: 11   traMADol (ULTRAM) 50 MG tablet, Take 1 tablet (50 mg total) by mouth every 6 (six) hours as needed., Disp: 40 tablet, Rfl: 0   Allergies  Allergen Reactions   Penicillins Nausea And Vomiting and Other (See Comments)    Child hood allergy - Flu symptoms; constant vomiting  Did it involve swelling of the face/tongue/throat, SOB, or low BP? Unknown Did it involve sudden or severe rash/hives, skin peeling, or any reaction on the inside of your mouth or nose? Unknown Did you need to seek medical attention at a hospital or doctor's office? Unknown When did it last happen?     50+ years If all above answers are "NO", may proceed with cephalosporin use.    Codeine Nausea And Vomiting and Rash    INTOLERANCE >  VOMITING    Past Medical History:  Diagnosis Date   Acute blood loss anemia 03/22/2015   Acute renal failure syndrome (HCC)    AKI (acute kidney injury) ([place])    Anemia    Aortic stenosis    Bilateral carpal tunnel syndrome 05/15/2018   Bruising    large bruise to left neck and T/O body   Chronic constipation 03/15/2019   Closed left humeral fracture 03/08/2019   Closed right hip fracture ([place]) 03/14/2015   Depression    Early cataract    Fracture of femoral neck, left, closed ([place]) 03/08/2019   Headache    Hip fracture (HCC)    HTN (hypertension)    Humerus fracture    Leukocytosis    Major depression, recurrent, chronic ([place])  03/15/2019   Osteonecrosis ([place])    post-traumatic osteonecrosis of the femoral head of the left hip, retained orthopedic hardware ( order for conset)   Osteoporosis    Peripheral vascular disease ([place])    blood clot right leg   Protein-calorie malnutrition, severe 09/21/2019   Rheumatoid arthritis ([place])    S/P TAVR (transcatheter aortic valve replacement) 08/08/2019   Sequelae of open wound of right lower extremity 03/15/2019   Severe aortic stenosis 07/19/2019   Thoracic ascending aortic aneurysm (HCC)    4.4 cm by 07/26/19 CTA   Traumatic arthritis of left hip 10/02/2019   Ulcer of lower extremity ([place])    Wears glasses      Past Surgical History:  Procedure Laterality Date   ABDOMINAL AORTOGRAM W/LOWER EXTREMITY N/A 05/08/2019   Procedure: ABDOMINAL AORTOGRAM W/LOWER EXTREMITY;  Surgeon: Serafina Mitchell, MD;  Location: [place] [facility];  Service: Cardiovascular;  Laterality: N/A;   CATARACT EXTRACTION W/PHACO Left 10/24/2020   Procedure: CATARACT EXTRACTION PHACO AND INTRAOCULAR LENS PLACEMENT LEFT EYE;  Surgeon: Baruch Goldmann, MD;  [facility];  Service: Ophthalmology;  Laterality: Left;  CDE  20.05   CATARACT EXTRACTION W/PHACO Right 11/07/2020   Procedure: CATARACT EXTRACTION PHACO AND INTRAOCULAR LENS PLACEMENT RIGHT EYE;  Surgeon: Baruch Goldmann, MD;  [facility];  Service: Ophthalmology;  Laterality: Right;  right, CDE=24.26   HARDWARE REMOVAL Left 10/02/2019   Procedure: HARDWARE REMOVAL;  Surgeon: Mcarthur Rossetti, MD;  Location: [place];  Service: Orthopedics;  Laterality: Left;   HIP PINNING,CANNULATED Left 03/09/2019   Procedure: CANNULATED HIP PINNING;  Surgeon: Mcarthur Rossetti, MD;  Location: [place];  Service: Orthopedics;  Laterality: Left;   INTRAMEDULLARY (IM) NAIL INTERTROCHANTERIC Right 03/14/2015   Procedure: RIGHT INTERTROCHANTRIC INTRAMEDULLARY (IM) NAIL ;  Surgeon: Mcarthur Rossetti, MD;  Location: [place];  Service: Orthopedics;  Laterality:  Right;   PERIPHERAL VASCULAR INTERVENTION Right 05/08/2019   Procedure: PERIPHERAL VASCULAR INTERVENTION;  Surgeon: Serafina Mitchell, MD;  Location: [place] [facility];  Service: Cardiovascular;  Laterality: Right;   RIGHT/LEFT HEART CATH AND CORONARY ANGIOGRAPHY N/A 07/19/2019   Procedure: RIGHT/LEFT HEART CATH AND CORONARY ANGIOGRAPHY;  Surgeon: Sherren Mocha, MD;  Location: [place] [facility];  Service: Cardiovascular;  Laterality: N/A;   TEE WITHOUT CARDIOVERSION N/A 08/07/2019   Procedure: TRANSESOPHAGEAL ECHOCARDIOGRAM (TEE);  Surgeon: Sherren Mocha, MD;  Location: [facility] [facility];  Service: Open Heart Surgery;  Laterality: N/A;   TOTAL HIP ARTHROPLASTY Left 10/02/2019   Procedure: LEFT TOTAL HIP ARTHROPLASTY ANTERIOR APPROACH AND REMOVAL OF CANNULATED SCREWS OF LEFT HIP;  Surgeon: Mcarthur Rossetti, MD;  Location: [place];  Service: Orthopedics;  Laterality: Left;   TRANSCATHETER AORTIC VALVE REPLACEMENT, TRANSFEMORAL  08/07/2019  TRANSCATHETER AORTIC VALVE REPLACEMENT, TRANSFEMORAL N/A 08/07/2019   Procedure: TRANSCATHETER AORTIC VALVE REPLACEMENT, TRANSFEMORAL;  Surgeon: Sherren Mocha, MD;  Location: [place] [facility];  Service: Open Heart Surgery;  Laterality: N/A;    Family History  Problem Relation Age of Onset   Cancer Sister     Social History   Tobacco Use   Smoking status: Never   Smokeless tobacco: Never  Vaping Use   Vaping Use: Never used  Substance Use Topics   Alcohol use: No   Drug use: No    ROS   Objective:   Vitals: BP 129/74 (BP Location: Right Arm)   Pulse 70   Temp 99.3 F (37.4 C) (Oral)   Resp 19   SpO2 97%   Physical Exam Constitutional:      General: She is not in acute distress.    Appearance: Normal appearance. She is well-developed. She is not ill-appearing, toxic-appearing or diaphoretic.  HENT:     Head: Normocephalic and atraumatic.     Nose: Nose normal.     Mouth/Throat:     Mouth: Mucous membranes are moist.   Eyes:     Extraocular Movements: Extraocular movements intact.     Pupils: Pupils are equal, round, and reactive to light.  Cardiovascular:     Rate and Rhythm: Normal rate and regular rhythm.     Pulses: Normal pulses.     Heart sounds: Murmur heard.    No friction rub. No gallop.  Pulmonary:     Effort: Pulmonary effort is normal. No respiratory distress.     Breath sounds: Normal breath sounds. No stridor. No wheezing, rhonchi or rales.  Musculoskeletal:     Left foot: Normal range of motion and normal capillary refill. Swelling (With associated erythema and slight ecchymosis distally as depicted) and tenderness (about medial dorsal aspect of her left foot) present. No deformity, laceration, bony tenderness or crepitus.     Comments: Left dorsalis pedis and posterior tibial pulses 1+.  Skin:    General: Skin is warm and dry.     Findings: No rash.  Neurological:     Mental Status: She is alert and oriented to person, place, and time.  Psychiatric:        Mood and Affect: Mood normal.        Behavior: Behavior normal.        Thought Content: Thought content normal.        Judgment: Judgment normal.          DG Foot Complete Left  Result Date: 01/06/2021 CLINICAL DATA:  Left foot pain and swelling, initial encounter EXAM: LEFT FOOT - COMPLETE 3+ VIEW COMPARISON:  None. FINDINGS: Mild soft tissue swelling is noted. No acute fracture or dislocation is seen. No foreign body is noted. IMPRESSION: Mild soft tissue swelling without acute bony abnormality. Electronically Signed   By: Inez Catalina M.D.   On: 01/06/2021 12:54    Assessment and Plan :   PDMP not reviewed this encounter.  1. Cellulitis of foot   2. Left foot pain   3. Swelling of left foot   4. History of transcatheter aortic valve replacement (TAVR)     Start Keflex 250mg  TID for 7 days for cellulitis. Use APAP otherwise for supportive care. Counseled patient on potential for adverse effects with medications  prescribed/recommended today, ER and return-to-clinic precautions discussed, patient verbalized understanding.    Jaynee Eagles, PA-C 01/06/21 1258

## 2021-01-06 NOTE — Discharge Instructions (Addendum)
Please just use Tylenol at a dose of 500mg -650mg  once every 6 hours as needed for your aches, pains, fevers. Do not use any nonsteroidal anti-inflammatories (NSAIDs) like ibuprofen, Motrin, naproxen, Aleve, etc. which are all available over-the-counter.  Follow up with your heart doctor if your symptoms continue to see if they want to look at your heart for conditions such as endocarditis. Otherwise, if you worsen with fever, chest pain, difficulty breathing, confusion then report to the hospital.

## 2021-01-06 NOTE — ED Triage Notes (Signed)
Patient c/o LFT foot pain x 9 days.   Patient endorses symptoms haven't subsided since onset.   Patient denies fall or trauma.   Patient endorses onset of symptoms began with itching . Patient states " I think I was bit by a bug".   Patient endorses bruising on foot and swelling.   Patient has used warm salt water and ice with no relief of symptoms.

## 2021-01-07 ENCOUNTER — Telehealth: Payer: Self-pay | Admitting: Cardiovascular Disease

## 2021-01-07 NOTE — Telephone Encounter (Signed)
Michaela Simpson is calling with concerns of her mother having a really bad infection from her surgery

## 2021-01-07 NOTE — Telephone Encounter (Signed)
Pts daughter calling to report she has developed cellulitis of her left foot. She is concerned because she has a TAVR and wasn't sure it would cause harm to the valve. She was placed on Keflex 250mg  tid x 7 days for the infection. Instructed to follow up with her primary care provider next week.  I advised the daughter she was on the appropriate medication and dose for cellulitis. If her sx do not improve over the weekend, she should call her primary care provider for an acute visit. If she develops a fever, confusion, increased swelling, redness, tenderness, or warmth over the site, she should seek emergency room care.   I will forward to MD for review and further recommendation.

## 2021-01-12 ENCOUNTER — Telehealth (HOSPITAL_BASED_OUTPATIENT_CLINIC_OR_DEPARTMENT_OTHER): Payer: Self-pay | Admitting: Family Medicine

## 2021-01-12 NOTE — Telephone Encounter (Signed)
Pts daughter called to push OV appt out two weeks due to Pt having cellulitis and not wanting to leave the house at this time

## 2021-01-13 ENCOUNTER — Ambulatory Visit (HOSPITAL_BASED_OUTPATIENT_CLINIC_OR_DEPARTMENT_OTHER): Payer: Medicare Other | Admitting: Family Medicine

## 2021-01-26 ENCOUNTER — Ambulatory Visit (HOSPITAL_BASED_OUTPATIENT_CLINIC_OR_DEPARTMENT_OTHER): Payer: Medicare Other | Admitting: Family Medicine

## 2021-01-27 ENCOUNTER — Ambulatory Visit (HOSPITAL_BASED_OUTPATIENT_CLINIC_OR_DEPARTMENT_OTHER): Payer: Medicare Other | Admitting: Family Medicine

## 2021-01-27 ENCOUNTER — Encounter (HOSPITAL_BASED_OUTPATIENT_CLINIC_OR_DEPARTMENT_OTHER): Payer: Self-pay

## 2021-02-03 ENCOUNTER — Encounter (HOSPITAL_BASED_OUTPATIENT_CLINIC_OR_DEPARTMENT_OTHER): Payer: Self-pay | Admitting: Family Medicine

## 2021-02-03 ENCOUNTER — Other Ambulatory Visit (HOSPITAL_BASED_OUTPATIENT_CLINIC_OR_DEPARTMENT_OTHER): Payer: Self-pay | Admitting: Family Medicine

## 2021-02-03 MED ORDER — AMLODIPINE BESYLATE 2.5 MG PO TABS: 2.5000 mg | ORAL_TABLET | Freq: Every day | ORAL | 0 refills | Status: DC

## 2021-02-09 IMAGING — RF DG C-ARM 1-60 MIN
1 series · 3 of 3 positions shown · non-contrast
Comparison: Femoral radiographs 09/19/2019

CLINICAL DATA: Left total hip arthroplasty, anterior approach

EXAM:
OPERATIVE LEFT HIP (WITH PELVIS IF PERFORMED) 2 VIEWS
TECHNIQUE: Fluoroscopic spot image(s) were submitted for interpretation
post-operatively.

[Series 1: unknown protocol · 0.20mm/px · 3 of 3 slices shown]
[im 1/3]
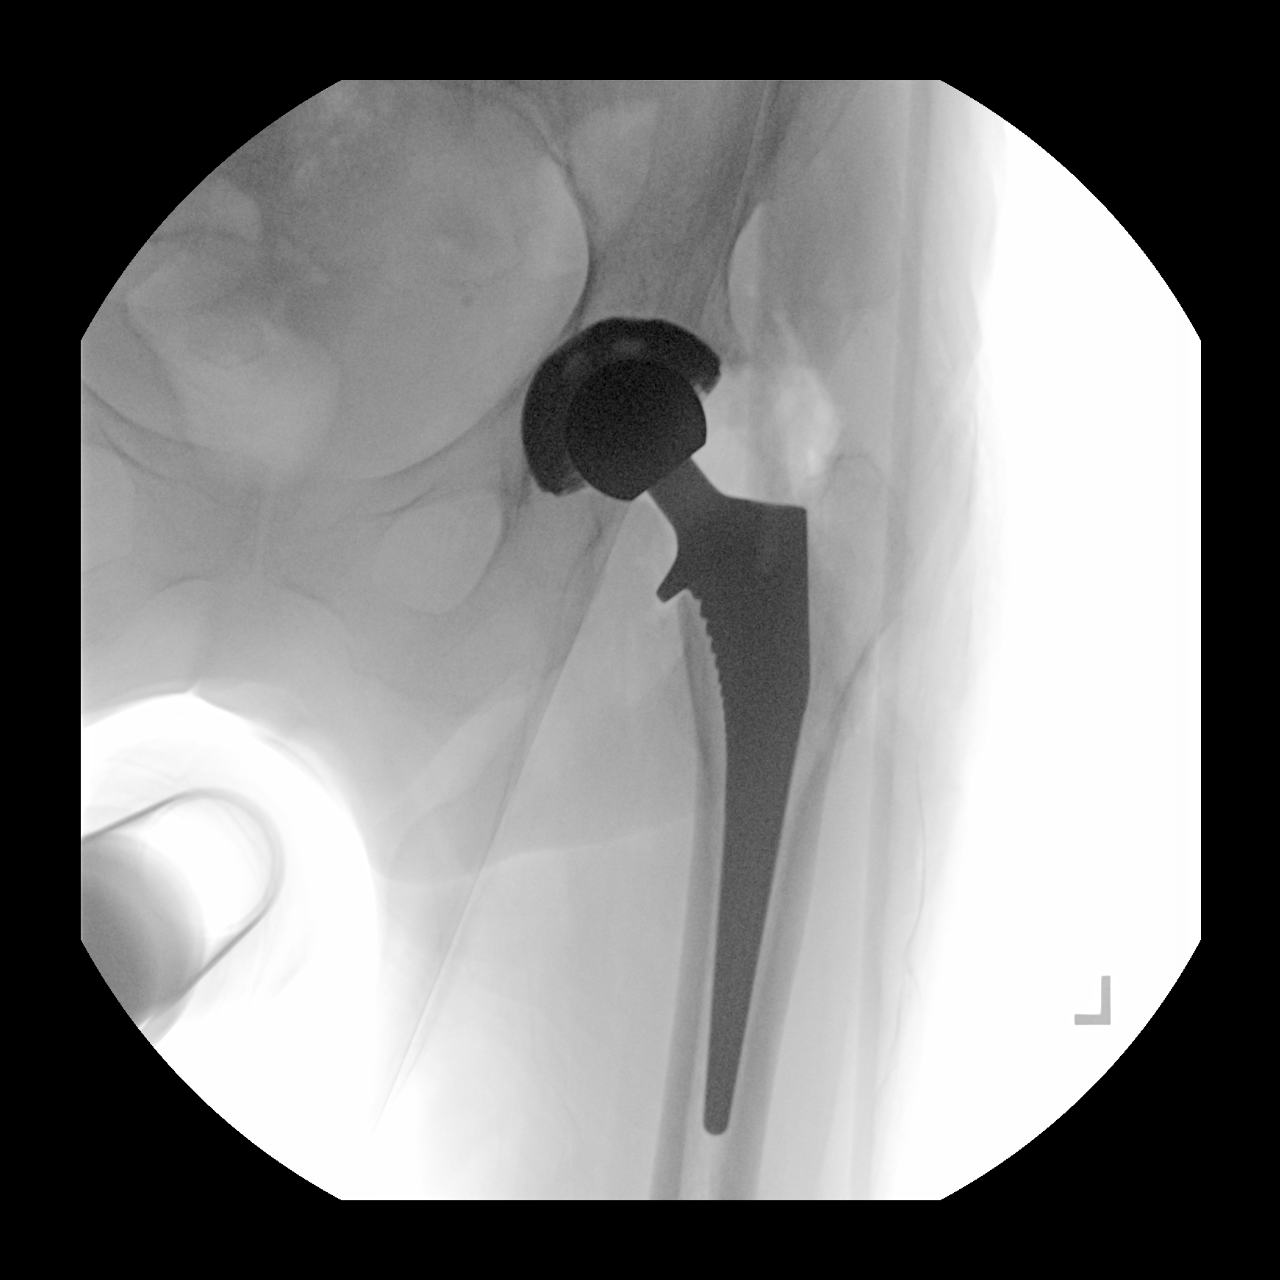
[im 2/3]
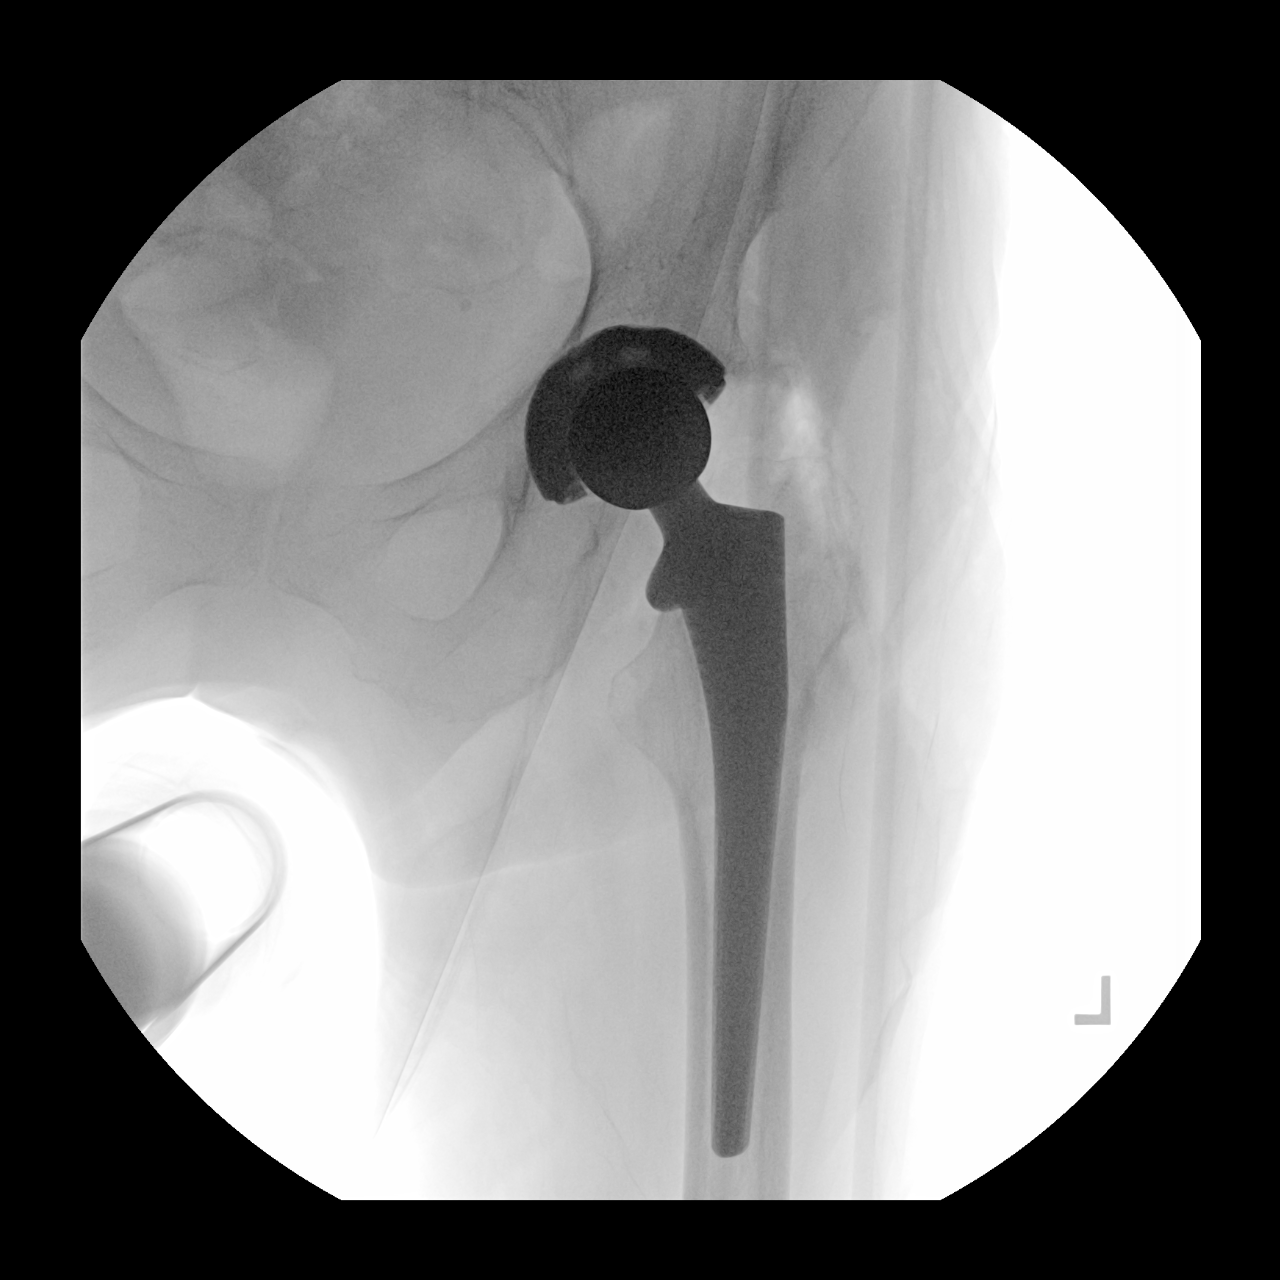
[im 3/3]
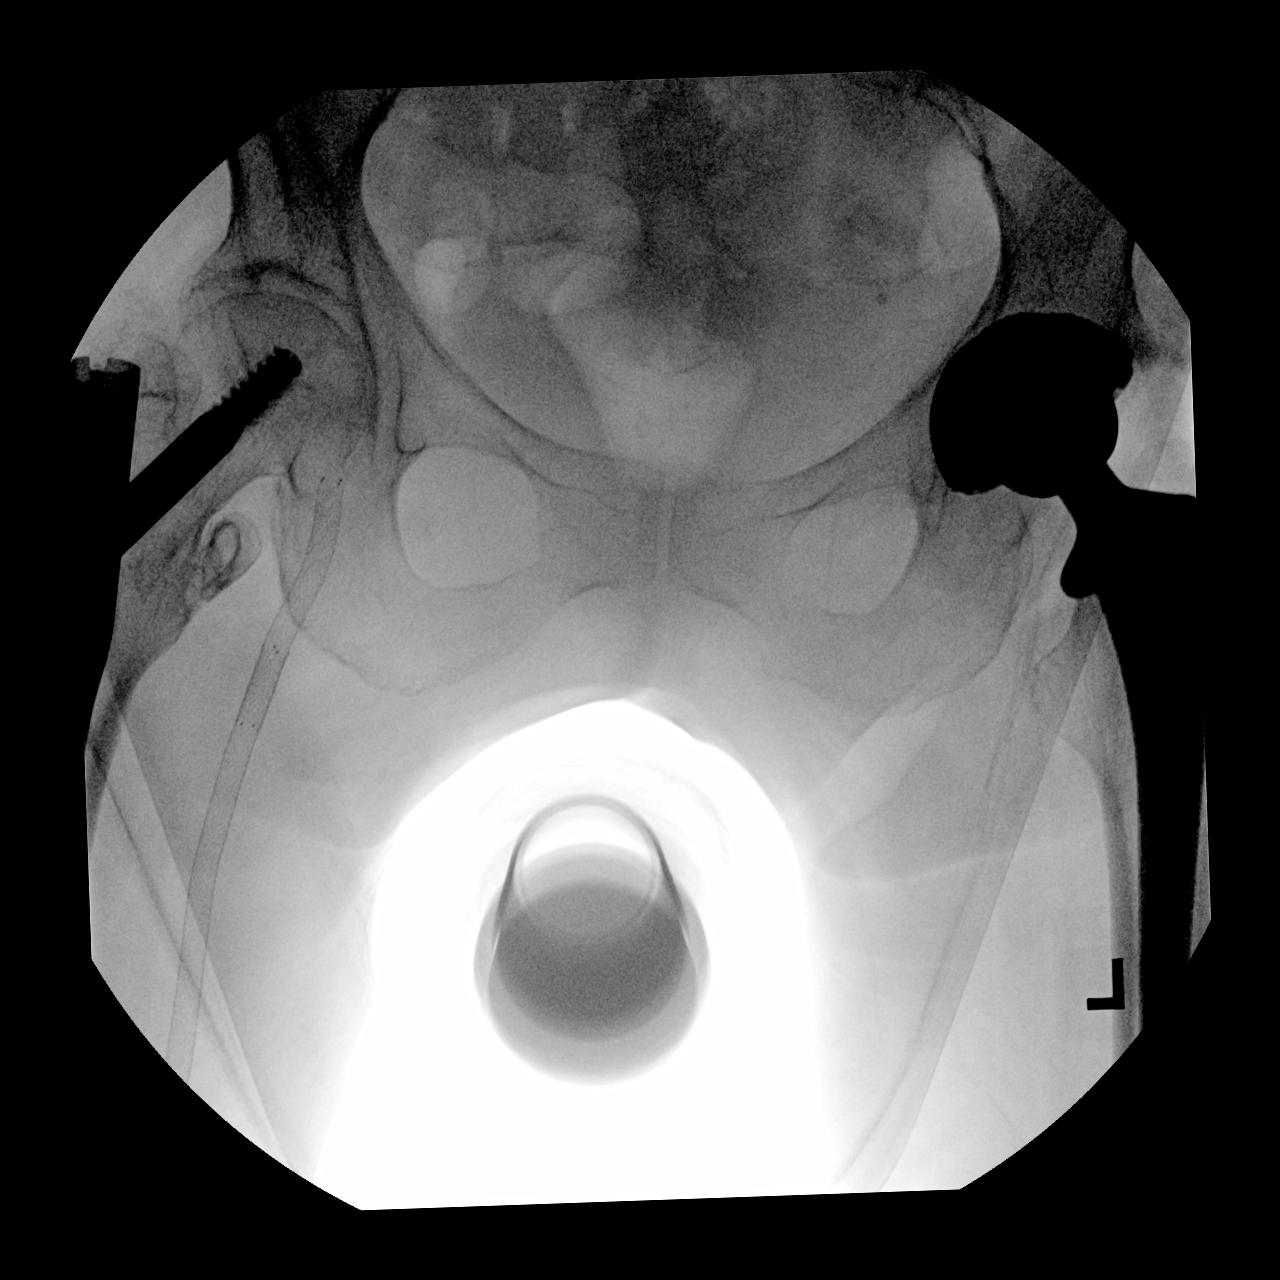

[3 of 3 positions shown; findings below may reference images not displayed]

FINDINGS: Interval removal of the cannulated transcervical screws of the left
femur with subsequent placement of a total left hip arthroplasty
including acetabular cup and articulating femoral stem which are in
expected alignment. There postsurgical soft tissue changes including
soft tissue gas and intra-articular gas at the left hip. No acute
complication. Remaining osseous structures are unremarkable.
IMPRESSION: Interval placement of a left total hip arthroplasty without acute
complication.

## 2021-02-09 IMAGING — DX DG PORTABLE PELVIS
1 series · 1 of 1 positions shown · non-contrast
Comparison: 10/02/2019

CLINICAL DATA: Left hip arthroplasty

EXAM:
PORTABLE PELVIS 1-2 VIEWS

[pelvis]
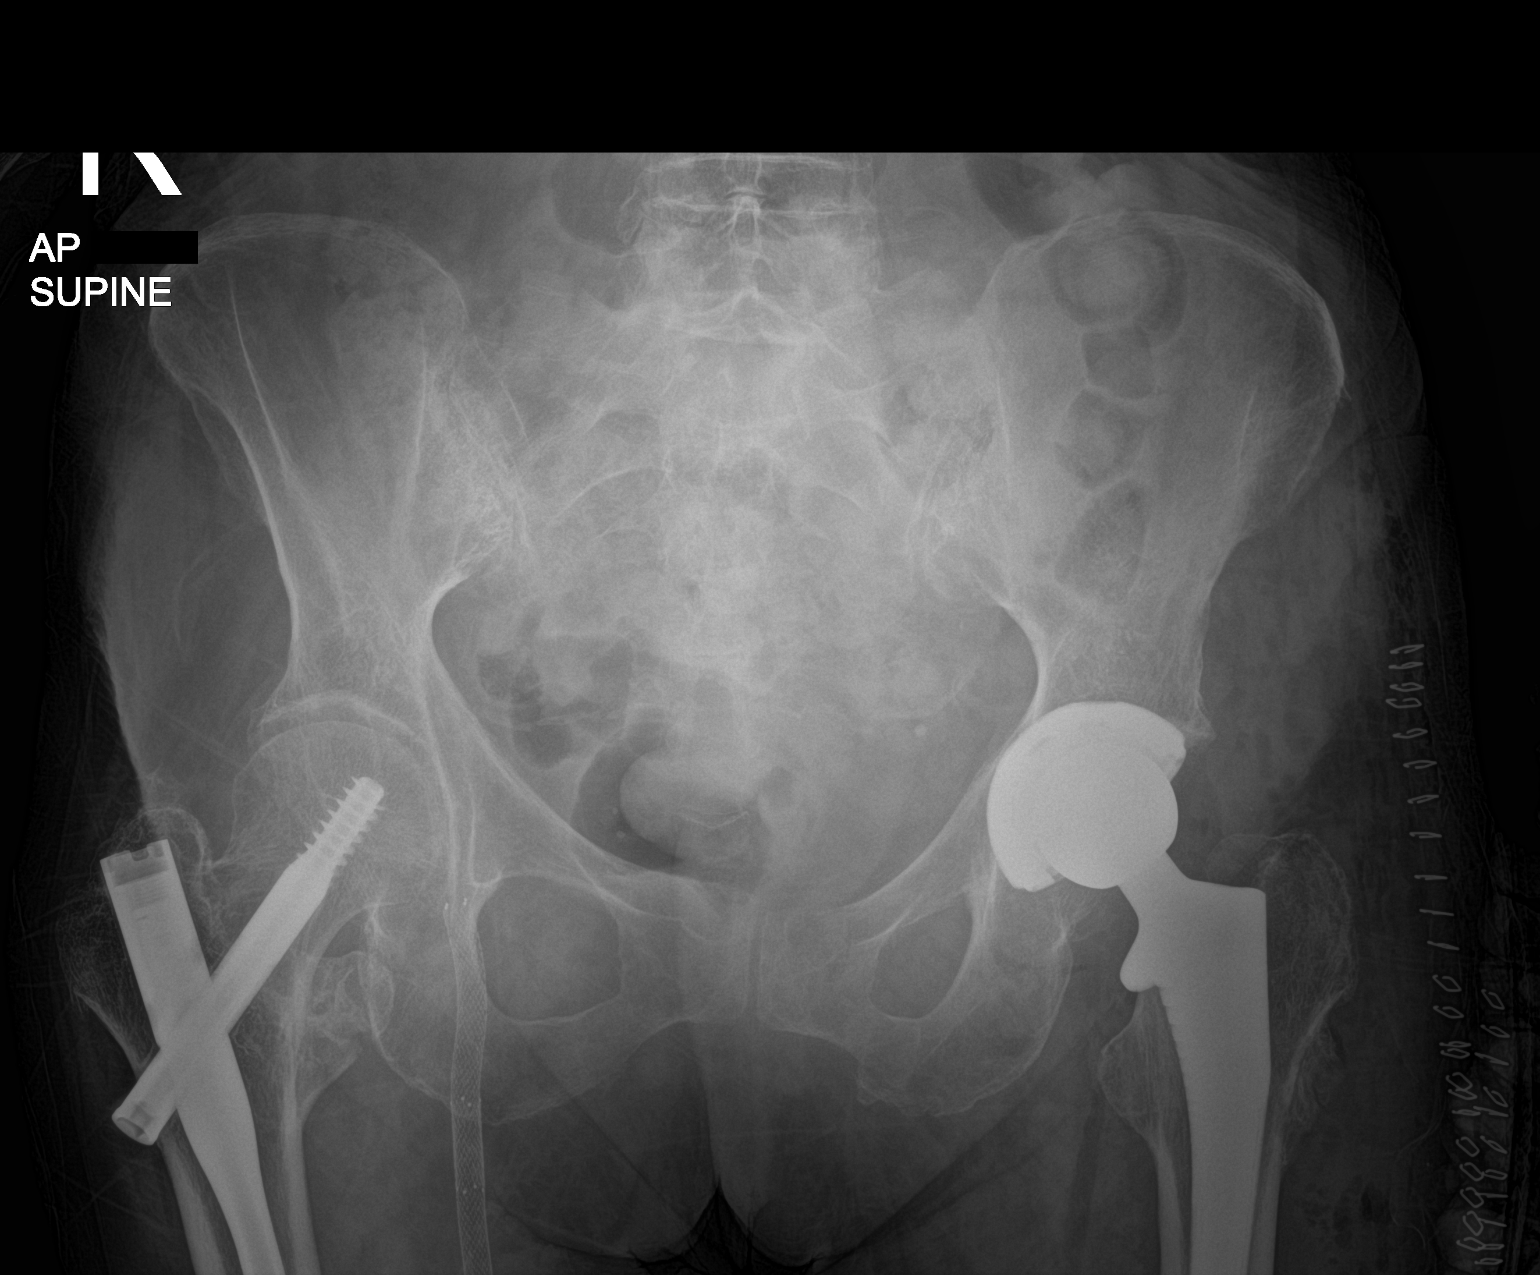

[1 of 1 positions shown; findings below may reference images not displayed]

FINDINGS: Supine frontal view of the pelvis demonstrates left hip arthroplasty
in the expected position without signs of acute complication.
Chronic postsurgical changes are seen within the right femur.
Vascular stent overlies right inguinal region. Postsurgical changes
soft tissues overlying left hip.
IMPRESSION: 1. Unremarkable left hip arthroplasty.

## 2021-02-09 IMAGING — RF DG HIP (WITH PELVIS) OPERATIVE*L*
1 series · 3 of 3 positions shown · non-contrast
Comparison: Femoral radiographs 09/19/2019

CLINICAL DATA: Left total hip arthroplasty, anterior approach

EXAM:
OPERATIVE LEFT HIP (WITH PELVIS IF PERFORMED) 2 VIEWS
TECHNIQUE: Fluoroscopic spot image(s) were submitted for interpretation
post-operatively.

[Series 1: unknown protocol · 0.20mm/px · 3 of 3 slices shown]
[im 1/3]
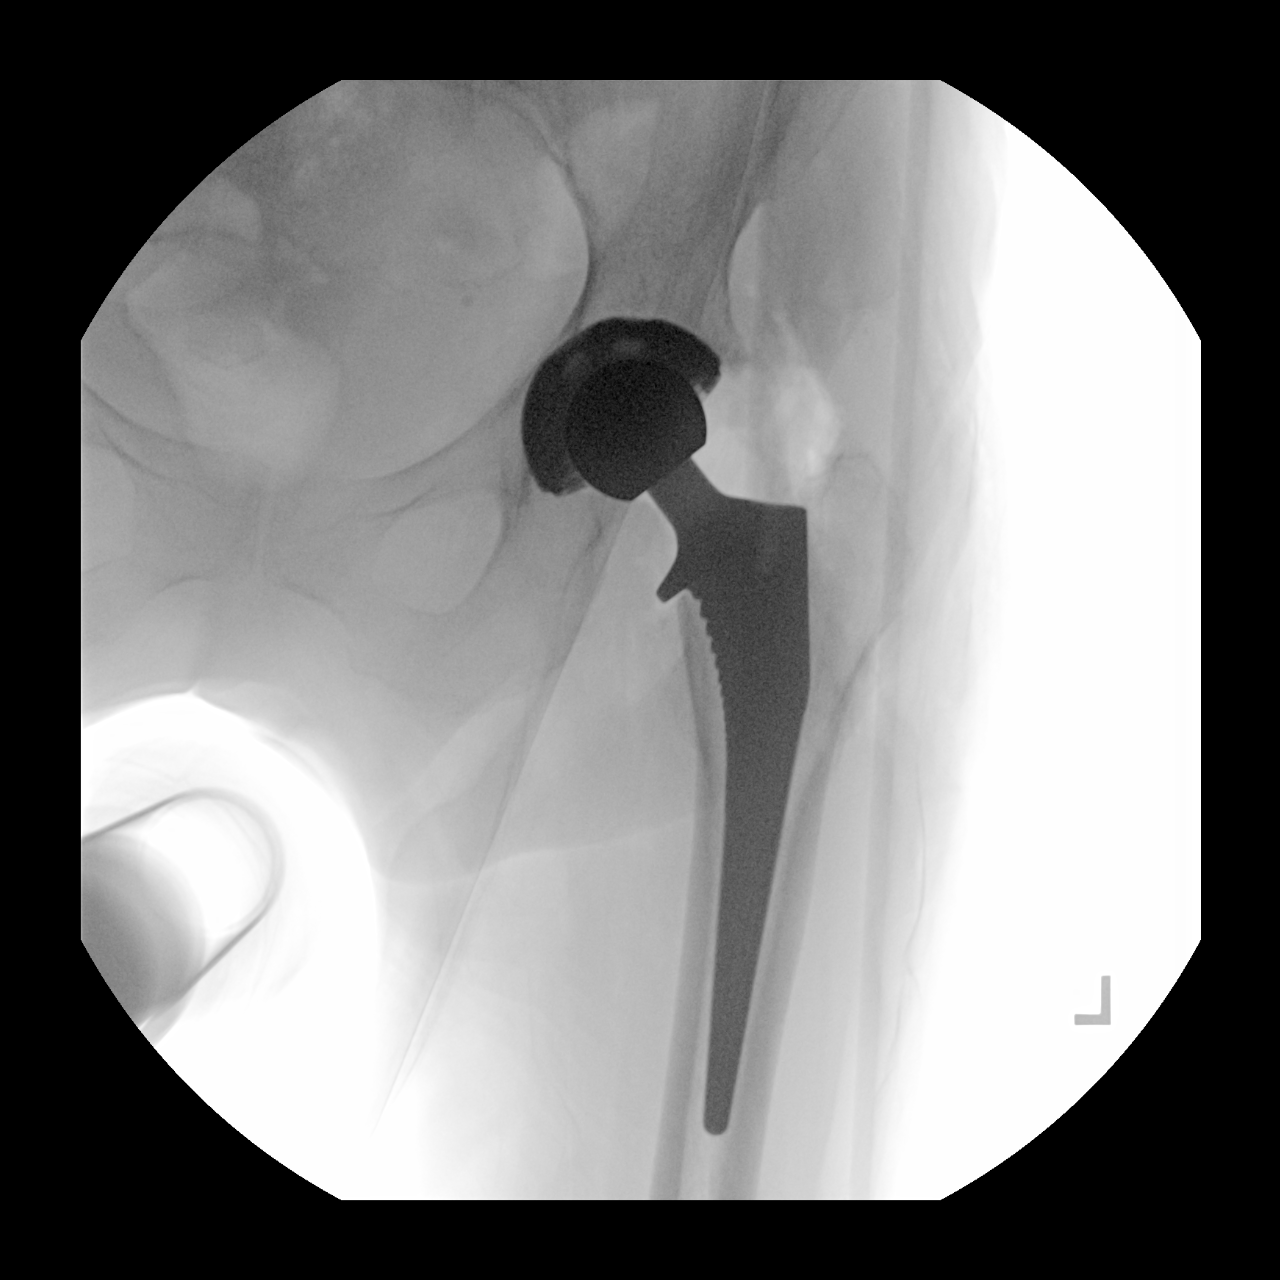
[im 2/3]
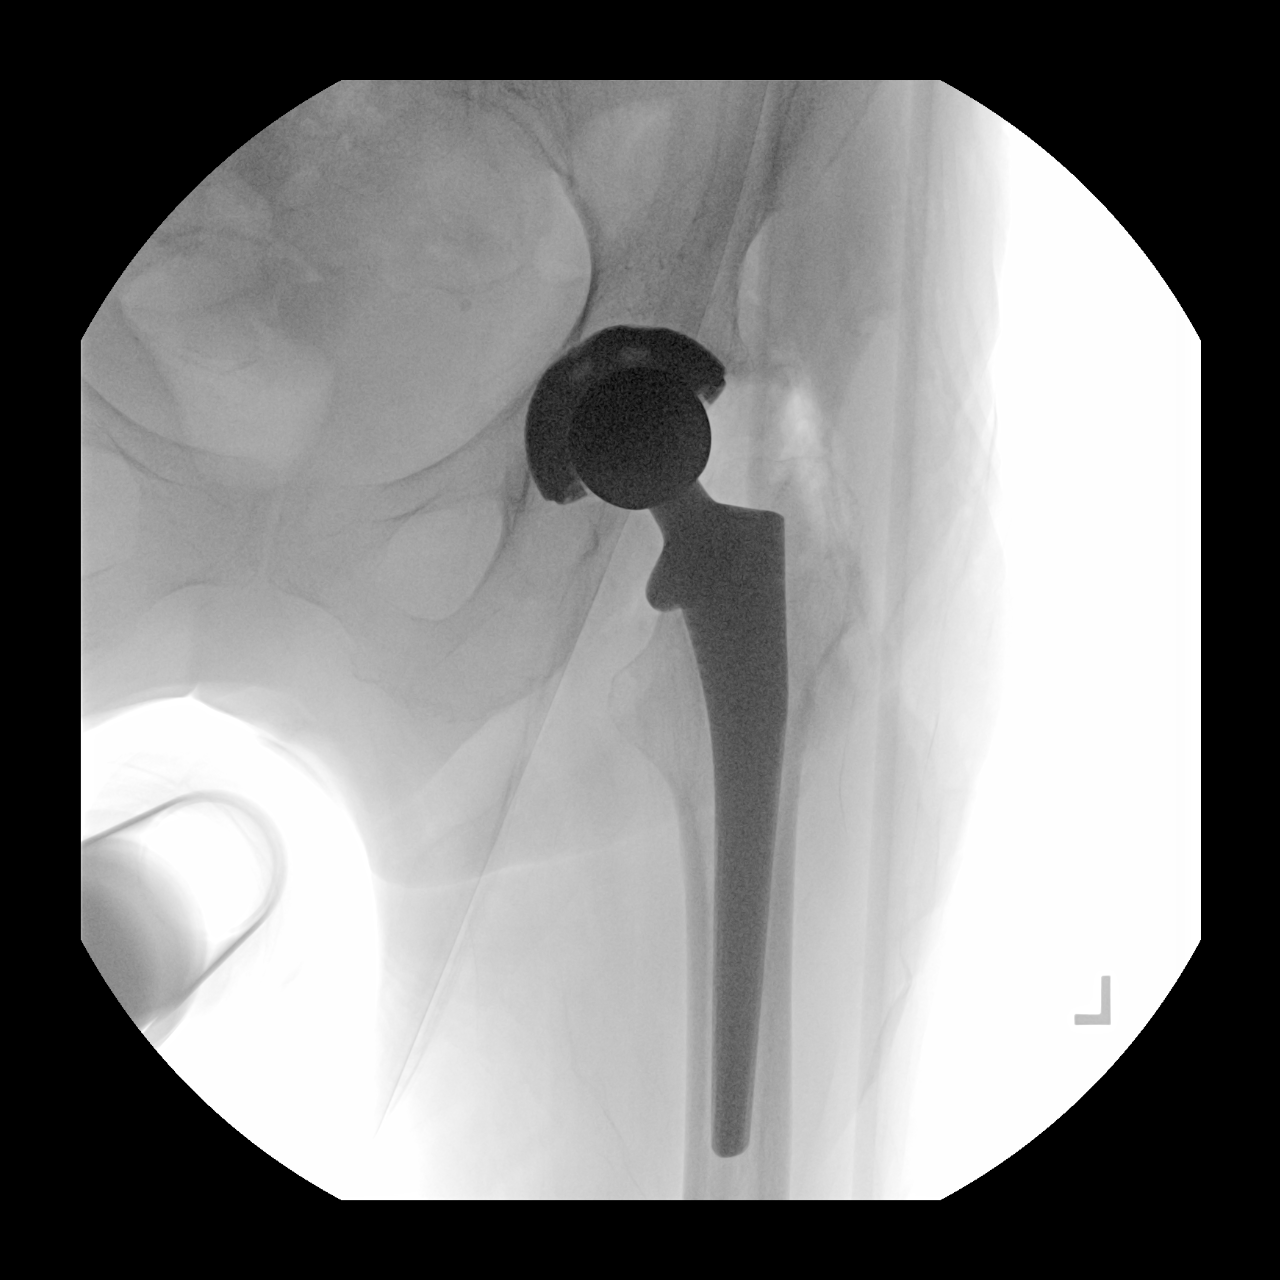
[im 3/3]
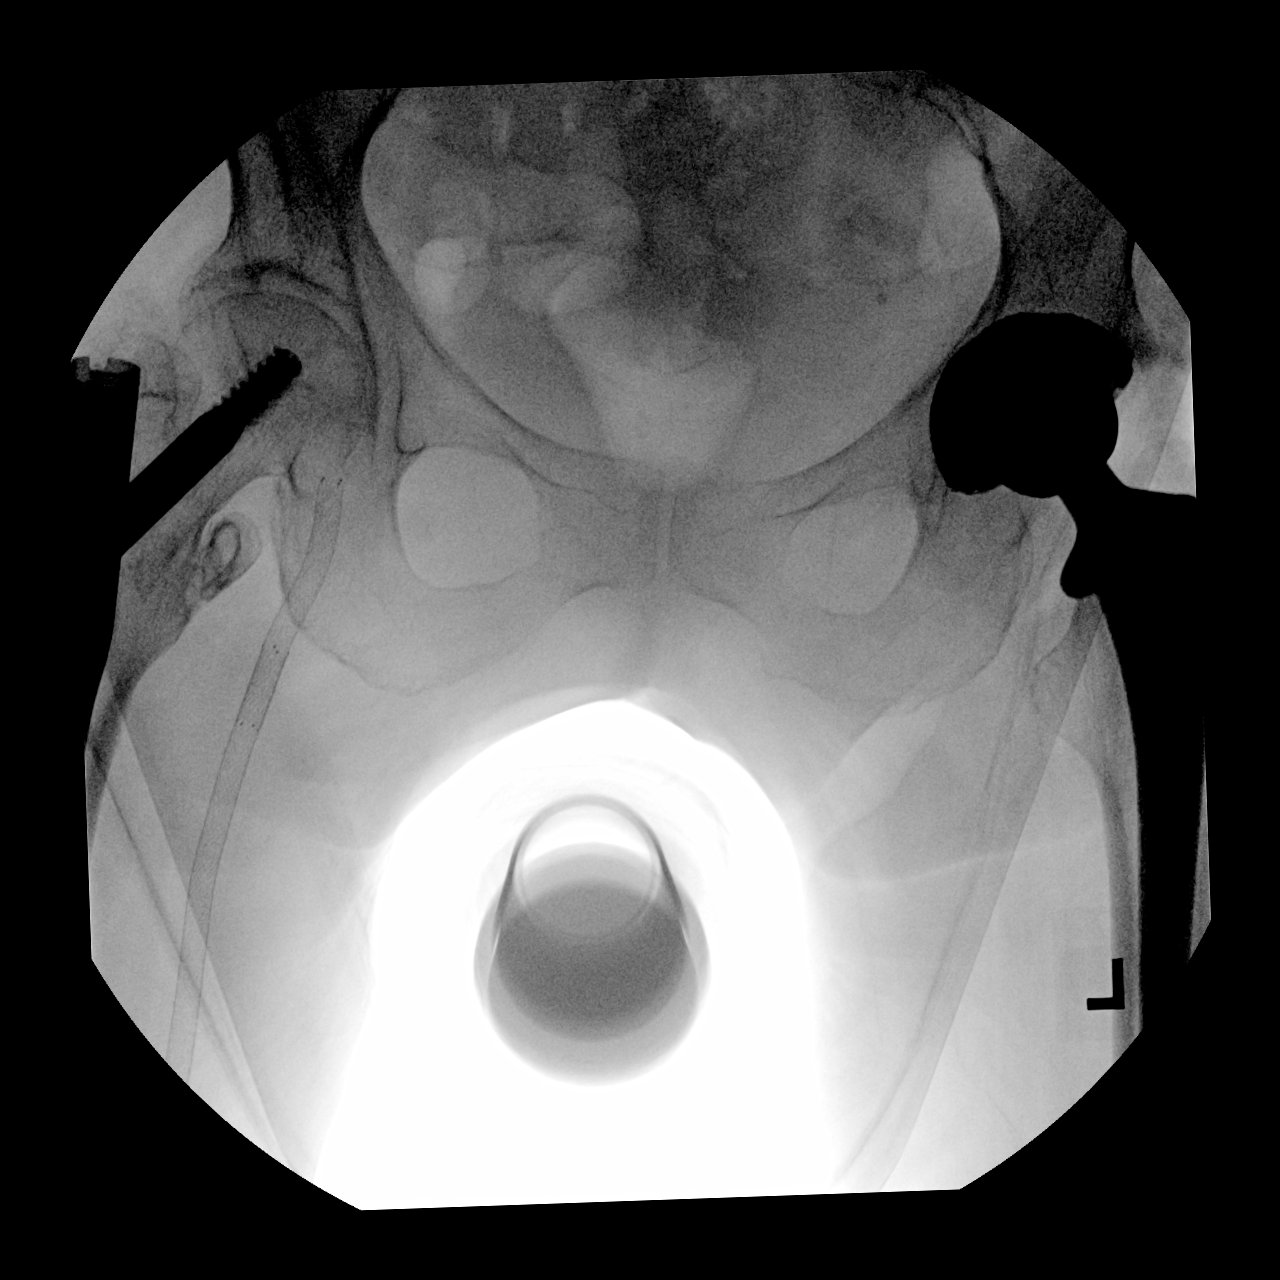

[3 of 3 positions shown; findings below may reference images not displayed]

FINDINGS: Interval removal of the cannulated transcervical screws of the left
femur with subsequent placement of a total left hip arthroplasty
including acetabular cup and articulating femoral stem which are in
expected alignment. There postsurgical soft tissue changes including
soft tissue gas and intra-articular gas at the left hip. No acute
complication. Remaining osseous structures are unremarkable.
IMPRESSION: Interval placement of a left total hip arthroplasty without acute
complication.

## 2021-03-01 ENCOUNTER — Other Ambulatory Visit (HOSPITAL_BASED_OUTPATIENT_CLINIC_OR_DEPARTMENT_OTHER): Payer: Self-pay | Admitting: Family Medicine

## 2021-03-02 ENCOUNTER — Other Ambulatory Visit (HOSPITAL_BASED_OUTPATIENT_CLINIC_OR_DEPARTMENT_OTHER): Payer: Self-pay | Admitting: Family Medicine

## 2021-03-03 ENCOUNTER — Encounter (HOSPITAL_BASED_OUTPATIENT_CLINIC_OR_DEPARTMENT_OTHER): Payer: Self-pay | Admitting: Family Medicine

## 2021-03-03 ENCOUNTER — Other Ambulatory Visit (HOSPITAL_BASED_OUTPATIENT_CLINIC_OR_DEPARTMENT_OTHER): Payer: Self-pay | Admitting: Family Medicine

## 2021-03-03 ENCOUNTER — Ambulatory Visit (INDEPENDENT_AMBULATORY_CARE_PROVIDER_SITE_OTHER): Payer: Medicare Other | Admitting: Family Medicine

## 2021-03-03 ENCOUNTER — Other Ambulatory Visit: Payer: Self-pay

## 2021-03-03 VITALS — BP 168/72 | HR 71 | Ht 60.0 in | Wt 114.0 lb

## 2021-03-03 DIAGNOSIS — I10 Essential (primary) hypertension: Secondary | ICD-10-CM

## 2021-03-03 DIAGNOSIS — R040 Epistaxis: Secondary | ICD-10-CM | POA: Diagnosis not present

## 2021-03-03 DIAGNOSIS — N3941 Urge incontinence: Secondary | ICD-10-CM

## 2021-03-03 DIAGNOSIS — G4700 Insomnia, unspecified: Secondary | ICD-10-CM | POA: Diagnosis not present

## 2021-03-03 HISTORY — DX: Insomnia, unspecified: G47.00

## 2021-03-03 MED ORDER — AMLODIPINE BESYLATE 5 MG PO TABS: 5.0000 mg | ORAL_TABLET | Freq: Every day | ORAL | 1 refills | Status: DC

## 2021-03-03 NOTE — Assessment & Plan Note (Signed)
This has improved some since last office visit.  She has made some adjustments including discontinuing Ativan. Notes that nighttime incontinence has been less frequent since discontinuing use of Ativan Still continues with some caffeine use Some days she will have no issues with incontinence and other days she will have 2-3 accidents At present, she feels that it is adequately controlled Continue to monitor, if she feels that it is impacting her life significantly enough, consider referral to urology

## 2021-03-03 NOTE — Assessment & Plan Note (Signed)
Has not had any nosebleeds since last office visit Will continue with monitoring If they return, consider evaluation with ENT

## 2021-03-03 NOTE — Assessment & Plan Note (Signed)
Denies any issues with chest pain, headaches, lightheadedness or dizziness Has been taking amlodipine 2.5 mg daily Not checking blood pressure regularly at home Discussed with patient and family member and will increase slightly to 5 mg dose Encourage intermittent home blood pressure monitoring for additional data points.  They indicated they will obtain pressure cuff for patient or family to check at least a few times per week Monitor for any symptoms such as chest pain, headaches, lightheadedness or dizziness Monitor at follow-up visit in about 6 weeks

## 2021-03-03 NOTE — Patient Instructions (Signed)
  Medication Instructions:  Your physician has recommended you make the following change in your medication:  -- INCREASE Amlodipine to 5 mg - Take 1 tablet (5 mg) by mouth daily --If you need a refill on any your medications before your next appointment, please call your pharmacy first. If no refills are authorized on file call the office.--  Follow-Up: Your next appointment:   Your physician recommends that you schedule a follow-up appointment in: 8 WEEKS with Dr. de [place]  Thanks for letting us be apart of your health journey!!  Primary Care and Sports Medicine   Dr. Arlina Robes [place]   We encourage you to activate your patient portal called "MyChart".  Sign up information is provided on this After Visit Summary.  MyChart is used to connect with patients for Virtual Visits (Telemedicine).  Patients are able to view lab/test results, encounter notes, upcoming appointments, etc.  Non-urgent messages can be sent to your provider as well. To learn more about what you can do with MyChart, please visit --  [email or web address].

## 2021-03-03 NOTE — Assessment & Plan Note (Signed)
Managed chronically with nightly Ativan in the past Previously discussed risk related to this as well as potential side effects of medication which can be contributory factor towards her urinary incontinence issues She has discontinued use of Ativan and noted improvement in urinary incontinence Unfortunately, she has had some difficulty with falling asleep at night Currently feels that the benefits from discontinuing medication outweigh the drawbacks of decreased sleep She plans to continue without use of Ativan and monitor symptoms which is reasonable

## 2021-03-03 NOTE — Progress Notes (Signed)
    Procedures performed today:    None.  Independent interpretation of notes and tests performed by another provider:   None.  Brief History, Exam, Impression, and Recommendations:    BP (!) 168/72   Pulse 71   Ht 5' (1.524 m)   Wt 114 lb (51.7 kg)   SpO2 (!) 65%   BMI 22.26 kg/m   Essential hypertension, benign Denies any issues with chest pain, headaches, lightheadedness or dizziness Has been taking amlodipine 2.5 mg daily Not checking blood pressure regularly at home Discussed with patient and family member and will increase slightly to 5 mg dose Encourage intermittent home blood pressure monitoring for additional data points.  They indicated they will obtain pressure cuff for patient or family to check at least a few times per week Monitor for any symptoms such as chest pain, headaches, lightheadedness or dizziness Monitor at follow-up visit in about 6 weeks  Epistaxis Has not had any nosebleeds since last office visit Will continue with monitoring If they return, consider evaluation with ENT  Urinary incontinence This has improved some since last office visit.  She has made some adjustments including discontinuing Ativan. Notes that nighttime incontinence has been less frequent since discontinuing use of Ativan Still continues with some caffeine use Some days she will have no issues with incontinence and other days she will have 2-3 accidents At present, she feels that it is adequately controlled Continue to monitor, if she feels that it is impacting her life significantly enough, consider referral to urology  Insomnia Managed chronically with nightly Ativan in the past Previously discussed risk related to this as well as potential side effects of medication which can be contributory factor towards her urinary incontinence issues She has discontinued use of Ativan and noted improvement in urinary incontinence Unfortunately, she has had some difficulty with falling  asleep at night Currently feels that the benefits from discontinuing medication outweigh the drawbacks of decreased sleep She plans to continue without use of Ativan and monitor symptoms which is reasonable  Plan for follow-up in 6 weeks Labs at next visit -plan to complete CBC, CMP   ___________________________________________ Rechelle Niebla de [place], MD, ABFM, [facility] Primary Care and [place]

## 2021-03-17 ENCOUNTER — Telehealth: Payer: Self-pay | Admitting: Radiology

## 2021-03-17 MED ORDER — MAGNESIUM OXIDE -MG SUPPLEMENT 400 (240 MG) MG PO TABS: 400.0000 mg | ORAL_TABLET | Freq: Every day | ORAL | 0 refills | Status: DC

## 2021-03-17 NOTE — Telephone Encounter (Signed)
Sent to pharmacy. Note attached that additional refills will need to be gotten from new PCP.

## 2021-03-17 NOTE — Telephone Encounter (Signed)
Refill request on Mag-Oxide 400mg  tablet-take one tablet by mouth every day #90-Hilts patient. OK to refill?

## 2021-03-30 ENCOUNTER — Ambulatory Visit (HOSPITAL_BASED_OUTPATIENT_CLINIC_OR_DEPARTMENT_OTHER): Payer: Medicare Other | Admitting: Family Medicine

## 2021-04-08 ENCOUNTER — Other Ambulatory Visit (HOSPITAL_BASED_OUTPATIENT_CLINIC_OR_DEPARTMENT_OTHER): Payer: Self-pay | Admitting: Family Medicine

## 2021-04-14 ENCOUNTER — Other Ambulatory Visit: Payer: Self-pay | Admitting: Orthopaedic Surgery

## 2021-04-14 ENCOUNTER — Ambulatory Visit (HOSPITAL_BASED_OUTPATIENT_CLINIC_OR_DEPARTMENT_OTHER): Payer: Medicare Other | Admitting: Family Medicine

## 2021-04-16 ENCOUNTER — Other Ambulatory Visit: Payer: Self-pay | Admitting: Orthopaedic Surgery

## 2021-04-16 ENCOUNTER — Ambulatory Visit (HOSPITAL_BASED_OUTPATIENT_CLINIC_OR_DEPARTMENT_OTHER): Payer: Medicare Other | Admitting: Family Medicine

## 2021-04-16 ENCOUNTER — Ambulatory Visit (INDEPENDENT_AMBULATORY_CARE_PROVIDER_SITE_OTHER): Payer: Medicare Other | Admitting: Family Medicine

## 2021-04-16 ENCOUNTER — Encounter (HOSPITAL_BASED_OUTPATIENT_CLINIC_OR_DEPARTMENT_OTHER): Payer: Self-pay | Admitting: Family Medicine

## 2021-04-16 ENCOUNTER — Other Ambulatory Visit: Payer: Self-pay

## 2021-04-16 VITALS — BP 112/62 | HR 89 | Ht 60.0 in | Wt 109.0 lb

## 2021-04-16 DIAGNOSIS — R509 Fever, unspecified: Secondary | ICD-10-CM | POA: Diagnosis not present

## 2021-04-16 DIAGNOSIS — I10 Essential (primary) hypertension: Secondary | ICD-10-CM | POA: Diagnosis not present

## 2021-04-16 DIAGNOSIS — J069 Acute upper respiratory infection, unspecified: Secondary | ICD-10-CM | POA: Insufficient documentation

## 2021-04-16 LAB — POCT INFLUENZA A/B
Influenza A, POC: NEGATIVE
Influenza B, POC: NEGATIVE

## 2021-04-16 NOTE — Patient Instructions (Addendum)
  Medication Instructions:  Your physician recommends that you continue on your current medications as directed. Please refer to the Current Medication list given to you today. --If you need a refill on any your medications before your next appointment, please call your pharmacy first. If no refills are authorized on file call the office.-- Follow-Up: Your next appointment:   Your physician recommends that you schedule a follow-up appointment in: 2-3 WEEKS with Dr. de [place]  You will receive a text message or e-mail with a link to a survey about your care and experience with [place] today! We would greatly appreciate your feedback!   Thanks for letting us be apart of your health journey!!  Primary Care and Sports Medicine   Dr. Arlina Robes [place]   We encourage you to activate your patient portal called "MyChart".  Sign up information is provided on this After Visit Summary.  MyChart is used to connect with patients for Virtual Visits (Telemedicine).  Patients are able to view lab/test results, encounter notes, upcoming appointments, etc.  Non-urgent messages can be sent to your provider as well. To learn more about what you can do with MyChart, please visit --  [email or web address].

## 2021-04-16 NOTE — Progress Notes (Signed)
    Procedures performed today:    None.  Independent interpretation of notes and tests performed by another provider:   None.  Brief History, Exam, Impression, and Recommendations:    BP 112/62   Pulse 89   Ht 5' (1.524 m)   Wt 109 lb (49.4 kg)   SpO2 99%   BMI 21.29 kg/m   Viral URI Patient is an 87 year old female accompanied to clinic by her daughter Patient presents for evaluation of recent illness. About 5 days ago, patient began experiencing symptoms of chills, fever, night sweats, body aches.  Only recent sick contact that she can recall is from about 3 weeks ago with someone who had coronavirus at that time. No significant headaches, sinus pressure, cough Has had mild nausea, decreased appetite Patient does have history of overactive bladder, she denies any acute worsening of urinary issues, no increased urination, no dysuria, no change in urine appearance or new odor On exam, patient is in no acute distress, cardiovascular exam with regular rate and rhythm, no murmurs appreciated Lungs clear to auscultation bilaterally Flu swab completed today and was negative.  Patient also brought in at home COVID test that she wanted completed, swabbed in office, result was negative Recommend continuing with symptomatic treatment for likely viral illness Plan for close follow-up in about 2 weeks or sooner as needed Follow-up visit will follow-up on status related to current issue as well as follow-up on chronic conditions.  We will complete labs at that time   ___________________________________________ Homero Hyson de [place], MD, ABFM, [facility] Primary [facility] and [place]

## 2021-04-16 NOTE — Assessment & Plan Note (Addendum)
Patient is an 87 year old female accompanied to clinic by her daughter Patient presents for evaluation of recent illness. About 5 days ago, patient began experiencing symptoms of chills, fever, night sweats, body aches.  Only recent sick contact that she can recall is from about 3 weeks ago with someone who had coronavirus at that time. No significant headaches, sinus pressure, cough Has had mild nausea, decreased appetite Patient does have history of overactive bladder, she denies any acute worsening of urinary issues, no increased urination, no dysuria, no change in urine appearance or new odor On exam, patient is in no acute distress, cardiovascular exam with regular rate and rhythm, no murmurs appreciated Lungs clear to auscultation bilaterally Flu swab completed today and was negative.  Patient also brought in at home COVID test that she wanted completed, swabbed in office, result was negative Recommend continuing with symptomatic treatment for likely viral illness Plan for close follow-up in about 2 weeks or sooner as needed Follow-up visit will follow-up on status related to current issue as well as follow-up on chronic conditions.  We will complete labs at that time

## 2021-04-27 ENCOUNTER — Other Ambulatory Visit: Payer: Self-pay | Admitting: Surgery

## 2021-05-07 ENCOUNTER — Encounter (HOSPITAL_BASED_OUTPATIENT_CLINIC_OR_DEPARTMENT_OTHER): Payer: Self-pay | Admitting: Family Medicine

## 2021-05-07 ENCOUNTER — Ambulatory Visit (INDEPENDENT_AMBULATORY_CARE_PROVIDER_SITE_OTHER): Payer: Medicare Other | Admitting: Family Medicine

## 2021-05-07 ENCOUNTER — Other Ambulatory Visit: Payer: Self-pay

## 2021-05-07 VITALS — BP 150/80 | HR 71 | Ht 60.0 in | Wt 111.0 lb

## 2021-05-07 DIAGNOSIS — N3941 Urge incontinence: Secondary | ICD-10-CM

## 2021-05-07 DIAGNOSIS — I10 Essential (primary) hypertension: Secondary | ICD-10-CM

## 2021-05-07 DIAGNOSIS — G4700 Insomnia, unspecified: Secondary | ICD-10-CM

## 2021-05-07 MED ORDER — LORAZEPAM 0.5 MG PO TABS: 0.5000 mg | ORAL_TABLET | Freq: Every day | ORAL | 1 refills | Status: DC

## 2021-05-07 NOTE — Progress Notes (Signed)
    Procedures performed today:    None.  Independent interpretation of notes and tests performed by another provider:   None.  Brief History, Exam, Impression, and Recommendations:    BP (!) 150/80   Pulse 71   Ht 5' (1.524 m)   Wt 111 lb (50.3 kg)   SpO2 100%   BMI 21.68 kg/m   Essential hypertension, benign Denies any current issues with chest pain, headaches Blood pressure today is borderline at goal given patient age Reports that she has been doing well with amlodipine at present dose of 5 mg She has not had any issues with lightheadedness or dizziness or feelings of presyncope Given current progress, will continue with current dose and monitor blood pressure at follow-up visits We will check CBC and CMP today  Urinary incontinence Continues to have issues, possible worsening over the time she first mentioned issue Given continued issues will refer to urology for further evaluation and recommendations  Insomnia She reports that she has had issues with sleep initiation, she has restarted her lorazepam to help with sleep We had discussed risks related with this in the past and potential for its impact on urinary frequency/incontinence, however she has not noticed any change in her urinary symptoms since discontinuing the benzodiazepine She is requesting refill of Ativan today Have once again discussed risk related to benzodiazepine use related to sleep, patient aware, refill provided today  Plan for follow-up in about 4 to 6 weeks or sooner as needed.  Follow-up on blood pressure, urology referral   ___________________________________________ Michaela Simpson [place], MD, ABFM, [facility] Primary Care and [place]

## 2021-05-07 NOTE — Assessment & Plan Note (Addendum)
Denies any current issues with chest pain, headaches Blood pressure today is borderline at goal given patient age Reports that she has been doing well with amlodipine at present dose of 5 mg She has not had any issues with lightheadedness or dizziness or feelings of presyncope Given current progress, will continue with current dose and monitor blood pressure at follow-up visits We will check CBC and CMP today

## 2021-05-07 NOTE — Patient Instructions (Signed)
  Medication Instructions:  Your physician recommends that you continue on your current medications as directed. Please refer to the Current Medication list given to you today. --If you need a refill on any your medications before your next appointment, please call your pharmacy first. If no refills are authorized on file call the office.-- Lab Work: Your physician has recommended that you have lab work today: CBC and CMP If you have labs (blood work) drawn today and your tests are completely normal, you will receive your results via [place] a phone call from our staff.  Please ensure you check your voicemail in the event that you authorized detailed messages to be left on a delegated number. If you have any lab test that is abnormal or we need to change your treatment, we will call you to review the results.  Referrals/Procedures/Imaging: A referral has been placed for you to [facility] for evaluation and treatment. Someone from the scheduling department will be in contact with you in regards to coordinating your consultation. If you do not hear from any of the schedulers within 7-10 business days please give their office a call.  [facility] [address], [place], [place] [address] Phone: [phone] Hours: 8:00 am - 5:00 pm Mon - Fri  Follow-Up: Your next appointment:   Your physician recommends that you schedule a follow-up appointment in: 4-6 WEEKS with Dr. de [place]  You will receive a text message or e-mail with a link to a survey about your care and experience with [place] today! We would greatly appreciate your feedback!   Thanks for letting us be apart of your health journey!!  Primary Care and Sports Medicine   Dr. Arlina Robes [place]   We encourage you to activate your patient portal called "MyChart".  Sign up information is provided on this After Visit Summary.  MyChart is used to connect with patients for Virtual Visits (Telemedicine).  Patients are able to view  lab/test results, encounter notes, upcoming appointments, etc.  Non-urgent messages can be sent to your provider as well. To learn more about what you can do with MyChart, please visit --  [email or web address].

## 2021-05-07 NOTE — Assessment & Plan Note (Signed)
Continues to have issues, possible worsening over the time she first mentioned issue Given continued issues will refer to urology for further evaluation and recommendations

## 2021-05-07 NOTE — Assessment & Plan Note (Signed)
She reports that she has had issues with sleep initiation, she has restarted her lorazepam to help with sleep We had discussed risks related with this in the past and potential for its impact on urinary frequency/incontinence, however she has not noticed any change in her urinary symptoms since discontinuing the benzodiazepine She is requesting refill of Ativan today Have once again discussed risk related to benzodiazepine use related to sleep, patient aware, refill provided today

## 2021-05-08 LAB — CBC WITH DIFFERENTIAL/PLATELET
Basophils Absolute: 0 10*3/uL (ref 0.0–0.2)
Basos: 0 %
EOS (ABSOLUTE): 0.1 10*3/uL (ref 0.0–0.4)
Eos: 1 %
Hematocrit: 41.7 % (ref 34.0–46.6)
Hemoglobin: 13.7 g/dL (ref 11.1–15.9)
Immature Grans (Abs): 0 10*3/uL (ref 0.0–0.1)
Immature Granulocytes: 0 %
Lymphocytes Absolute: 1.4 10*3/uL (ref 0.7–3.1)
Lymphs: 16 %
MCH: 29 pg (ref 26.6–33.0)
MCHC: 32.9 g/dL (ref 31.5–35.7)
MCV: 88 fL (ref 79–97)
Monocytes Absolute: 0.5 10*3/uL (ref 0.1–0.9)
Monocytes: 6 %
Neutrophils Absolute: 6.5 10*3/uL (ref 1.4–7.0)
Neutrophils: 77 %
Platelets: 264 10*3/uL (ref 150–450)
RBC: 4.72 x10E6/uL (ref 3.77–5.28)
RDW: 13.9 % (ref 11.7–15.4)
WBC: 8.5 10*3/uL (ref 3.4–10.8)

## 2021-05-08 LAB — COMPREHENSIVE METABOLIC PANEL
ALT: 20 IU/L (ref 0–32)
AST: 24 IU/L (ref 0–40)
Albumin/Globulin Ratio: 2.3 — ABNORMAL HIGH (ref 1.2–2.2)
Albumin: 4.9 g/dL — ABNORMAL HIGH (ref 3.6–4.6)
Alkaline Phosphatase: 120 IU/L (ref 44–121)
BUN/Creatinine Ratio: 21 (ref 12–28)
BUN: 23 mg/dL (ref 8–27)
Bilirubin Total: 0.3 mg/dL (ref 0.0–1.2)
CO2: 21 mmol/L (ref 20–29)
Calcium: 10.2 mg/dL (ref 8.7–10.3)
Chloride: 106 mmol/L (ref 96–106)
Creatinine, Ser: 1.08 mg/dL — ABNORMAL HIGH (ref 0.57–1.00)
Globulin, Total: 2.1 g/dL (ref 1.5–4.5)
Glucose: 76 mg/dL (ref 70–99)
Potassium: 4.8 mmol/L (ref 3.5–5.2)
Sodium: 145 mmol/L — ABNORMAL HIGH (ref 134–144)
Total Protein: 7 g/dL (ref 6.0–8.5)
eGFR: 50 mL/min/{1.73_m2} — ABNORMAL LOW (ref >59)

## 2021-05-27 ENCOUNTER — Other Ambulatory Visit: Payer: Self-pay | Admitting: Family Medicine

## 2021-06-17 ENCOUNTER — Other Ambulatory Visit: Payer: Self-pay | Admitting: Family Medicine

## 2021-06-18 ENCOUNTER — Ambulatory Visit (HOSPITAL_BASED_OUTPATIENT_CLINIC_OR_DEPARTMENT_OTHER): Payer: Medicare Other | Admitting: Family Medicine

## 2021-07-07 ENCOUNTER — Other Ambulatory Visit (HOSPITAL_BASED_OUTPATIENT_CLINIC_OR_DEPARTMENT_OTHER): Payer: Self-pay | Admitting: Family Medicine

## 2021-07-27 ENCOUNTER — Other Ambulatory Visit (HOSPITAL_BASED_OUTPATIENT_CLINIC_OR_DEPARTMENT_OTHER): Payer: Self-pay | Admitting: Family Medicine

## 2021-07-31 ENCOUNTER — Other Ambulatory Visit (HOSPITAL_BASED_OUTPATIENT_CLINIC_OR_DEPARTMENT_OTHER): Payer: Self-pay | Admitting: Family Medicine

## 2021-07-31 ENCOUNTER — Encounter (HOSPITAL_BASED_OUTPATIENT_CLINIC_OR_DEPARTMENT_OTHER): Payer: Self-pay | Admitting: Family Medicine

## 2021-07-31 ENCOUNTER — Other Ambulatory Visit: Payer: Self-pay | Admitting: Family Medicine

## 2021-08-15 ENCOUNTER — Other Ambulatory Visit (HOSPITAL_BASED_OUTPATIENT_CLINIC_OR_DEPARTMENT_OTHER): Payer: Self-pay | Admitting: Family Medicine

## 2021-08-17 ENCOUNTER — Other Ambulatory Visit: Payer: Self-pay

## 2021-08-17 DIAGNOSIS — I739 Peripheral vascular disease, unspecified: Secondary | ICD-10-CM

## 2021-08-24 ENCOUNTER — Ambulatory Visit: Payer: Medicare Other

## 2021-08-24 ENCOUNTER — Encounter (HOSPITAL_COMMUNITY): Payer: Medicare Other

## 2021-09-08 ENCOUNTER — Other Ambulatory Visit (HOSPITAL_BASED_OUTPATIENT_CLINIC_OR_DEPARTMENT_OTHER): Payer: Self-pay | Admitting: Family Medicine

## 2021-10-03 ENCOUNTER — Other Ambulatory Visit (HOSPITAL_BASED_OUTPATIENT_CLINIC_OR_DEPARTMENT_OTHER): Payer: Self-pay | Admitting: Family Medicine

## 2021-10-05 ENCOUNTER — Ambulatory Visit (INDEPENDENT_AMBULATORY_CARE_PROVIDER_SITE_OTHER): Payer: Medicare Other | Admitting: Physician Assistant

## 2021-10-05 ENCOUNTER — Ambulatory Visit (INDEPENDENT_AMBULATORY_CARE_PROVIDER_SITE_OTHER)
Admission: RE | Admit: 2021-10-05 | Discharge: 2021-10-05 | Disposition: A | Discharge status: Home / Self Care | Payer: Medicare Other | Source: Ambulatory Visit | Attending: Surgery | Admitting: Surgery

## 2021-10-05 ENCOUNTER — Ambulatory Visit (HOSPITAL_COMMUNITY)
Admission: RE | Admit: 2021-10-05 | Discharge: 2021-10-05 | Disposition: A | Discharge status: Home / Self Care | Payer: Medicare Other | Source: Ambulatory Visit | Attending: Surgery | Admitting: Surgery

## 2021-10-05 ENCOUNTER — Encounter: Payer: Self-pay | Admitting: Physician Assistant

## 2021-10-05 VITALS — BP 150/72 | HR 63 | Temp 97.8°F | Resp 14 | Ht 62.0 in | Wt 112.0 lb

## 2021-10-05 DIAGNOSIS — I739 Peripheral vascular disease, unspecified: Secondary | ICD-10-CM | POA: Diagnosis not present

## 2021-10-05 NOTE — Progress Notes (Signed)
[facility] ?HISTORY AND PHYSICAL  ? ?History of Present Illness:  Patient is a 88 y.o. year old female who presents for evaluation of  ?PAD with history of right lower extremity pre-tibial ulceration.  This was nearly healed on her last visit 1 year ago.  She is status post abdominal aortogram, bilateral lower extremity runoff. Stent of right superficial femoral- popliteal artery. Mechanical Thrombectomy of the right superficial femoral -popliteal artery using the penumbra CAT 6 device. Mynx closure device of left femoral artery 05/08/2019.   ? She reports today without non healing wounds, no rest pain or claudication.  She is ambulating without assistive devices now as well.  Her right anterior shin is completely healed.   ? She denies new symptoms of non healing wounds, rest pain or claudication.  She is maximally medically  managed on ASA and Statin.   ? ? ?Past Medical History:  ?Diagnosis Date  ? Acute blood loss anemia 03/22/2015  ? Acute renal failure syndrome (HCC)   ? AKI (acute kidney injury) ([place])   ? Anemia   ? Aortic stenosis   ? Bilateral carpal tunnel syndrome 05/15/2018  ? Bruising   ? large bruise to left neck and T/O body  ? Chronic constipation 03/15/2019  ? Closed left humeral fracture 03/08/2019  ? Closed right hip fracture ([place]) 03/14/2015  ? Depression   ? Early cataract   ? Fracture of femoral neck, left, closed ([place]) 03/08/2019  ? Headache   ? Hip fracture ([place])   ? HTN (hypertension)   ? Humerus fracture   ? Leukocytosis   ? Major depression, recurrent, chronic ([place]) 03/15/2019  ? Osteonecrosis ([place])   ? post-traumatic osteonecrosis of the femoral head of the left hip, retained orthopedic hardware ( order for conset)  ? Osteoporosis   ? Peripheral vascular disease ([place])   ? blood clot right leg  ? Protein-calorie malnutrition, severe 09/21/2019  ? Rheumatoid arthritis ([place])   ? S/P TAVR (transcatheter aortic valve replacement) 08/08/2019  ? Sequelae of open wound of  right lower extremity 03/15/2019  ? Severe aortic stenosis 07/19/2019  ? Thoracic ascending aortic aneurysm ([place])   ? 4.4 cm by 07/26/19 CTA  ? Traumatic arthritis of left hip 10/02/2019  ? Ulcer of lower extremity ([place])   ? Wears glasses   ? ? ?Past Surgical History:  ?Procedure Laterality Date  ? ABDOMINAL AORTOGRAM W/LOWER EXTREMITY N/A 05/08/2019  ? Procedure: ABDOMINAL AORTOGRAM W/LOWER EXTREMITY;  Surgeon: Serafina Mitchell, MD;  Location: [facility];  Service: Cardiovascular;  Laterality: N/A;  ? CATARACT EXTRACTION W/PHACO Left 10/24/2020  ? Procedure: CATARACT EXTRACTION PHACO AND INTRAOCULAR LENS PLACEMENT LEFT EYE;  Surgeon: Baruch Goldmann, MD;  [facility]: [facility];  Service: Ophthalmology;  Laterality: Left;  CDE  20.05  ? CATARACT EXTRACTION W/PHACO Right 11/07/2020  ? Procedure: CATARACT EXTRACTION PHACO AND INTRAOCULAR LENS PLACEMENT RIGHT EYE;  Surgeon: Baruch Goldmann, MD;  Location: [facility];  Service: Ophthalmology;  Laterality: Right;  right, ?CDE=24.26  ? HARDWARE REMOVAL Left 10/02/2019  ? Procedure: HARDWARE REMOVAL;  Surgeon: Mcarthur Rossetti, MD;  Location: [facility];  Service: Orthopedics;  Laterality: Left;  ? HIP PINNING,CANNULATED Left 03/09/2019  ? Procedure: CANNULATED HIP PINNING;  Surgeon: Mcarthur Rossetti, MD;  Location: [facility];  Service: Orthopedics;  Laterality: Left;  ? INTRAMEDULLARY (IM) NAIL INTERTROCHANTERIC Right 03/14/2015  ? Procedure: RIGHT INTERTROCHANTRIC INTRAMEDULLARY (IM) NAIL ;  Surgeon: Mcarthur Rossetti, MD;  Location: [facility]  OR;  Service: Orthopedics;  Laterality: Right;  ? PERIPHERAL VASCULAR INTERVENTION Right 05/08/2019  ? Procedure: PERIPHERAL VASCULAR INTERVENTION;  Surgeon: Serafina Mitchell, MD;  Location: [facility];  Service: Cardiovascular;  Laterality: Right;  ? RIGHT/LEFT HEART CATH AND CORONARY ANGIOGRAPHY N/A 07/19/2019  ? Procedure: RIGHT/LEFT HEART CATH AND CORONARY ANGIOGRAPHY;  Surgeon: Sherren Mocha, MD;  Location: [facility];   Service: Cardiovascular;  Laterality: N/A;  ? TEE WITHOUT CARDIOVERSION N/A 08/07/2019  ? Procedure: TRANSESOPHAGEAL ECHOCARDIOGRAM (TEE);  Surgeon: Sherren Mocha, MD;  Location: [facility];  Service: Open Heart Surgery;  Laterality: N/A;  ? TOTAL HIP ARTHROPLASTY Left 10/02/2019  ? Procedure: LEFT TOTAL HIP ARTHROPLASTY ANTERIOR APPROACH AND REMOVAL OF CANNULATED SCREWS OF LEFT HIP;  Surgeon: Mcarthur Rossetti, MD;  Location: [facility];  Service: Orthopedics;  Laterality: Left;  ? TRANSCATHETER AORTIC VALVE REPLACEMENT, TRANSFEMORAL  08/07/2019  ? TRANSCATHETER AORTIC VALVE REPLACEMENT, TRANSFEMORAL N/A 08/07/2019  ? Procedure: TRANSCATHETER AORTIC VALVE REPLACEMENT, TRANSFEMORAL;  Surgeon: Sherren Mocha, MD;  Location: [facility];  Service: Open Heart Surgery;  Laterality: N/A;  ? ? ?ROS:  ? ?General:  No weight loss, Fever, chills ? ?HEENT: No recent headaches, no nasal bleeding, no visual changes, no sore throat ? ?Neurologic: No dizziness, blackouts, seizures. No recent symptoms of stroke or mini- stroke. No recent episodes of slurred speech, or temporary blindness. ? ?Cardiac: No recent episodes of chest pain/pressure, no shortness of breath at rest.  No shortness of breath with exertion.  Denies history of atrial fibrillation or irregular heartbeat ? ?Vascular: No history of rest pain in feet.  No history of claudication.  No history of non-healing ulcer, No history of DVT  ? ?Pulmonary: No home oxygen, no productive cough, no hemoptysis,  No asthma or wheezing ? ?Musculoskeletal:  '[ ]'$  Arthritis, '[ ]'$  Low back pain,  '[ ]'$  Joint pain ? ?Hematologic:No history of hypercoagulable state.  No history of easy bleeding.  No history of anemia ? ?Gastrointestinal: No hematochezia or melena,  No gastroesophageal reflux, no trouble swallowing ? ?Urinary: '[ ]'$  chronic Kidney disease, '[ ]'$  on HD - '[ ]'$  MWF or '[ ]'$  TTHS, '[ ]'$  Burning with urination, '[ ]'$  Frequent urination, '[ ]'$  Difficulty urinating;  ? ?Skin: No  rashes ? ?Psychological: No history of anxiety,  No history of depression ? ?Social History ?Social History  ? ?Tobacco Use  ? Smoking status: Never  ? Smokeless tobacco: Never  ?Vaping Use  ? Vaping Use: Never used  ?Substance Use Topics  ? Alcohol use: No  ? Drug use: No  ? ? ?Family History ?Family History  ?Problem Relation Age of Onset  ? Cancer Sister   ? ? ?Allergies ? ?Allergies  ?Allergen Reactions  ? Penicillins Nausea And Vomiting and Other (See Comments)  ?  Child hood allergy - Flu symptoms; constant vomiting  ?Did it involve swelling of the face/tongue/throat, SOB, or low BP? Unknown ?Did it involve sudden or severe rash/hives, skin peeling, or any reaction on the inside of your mouth or nose? Unknown ?Did you need to seek medical attention at a hospital or doctor's office? Unknown ?When did it last happen?     50+ years ?If all above answers are "NO", may proceed with cephalosporin use. ?  ? Codeine Nausea And Vomiting and Rash  ?  INTOLERANCE >  VOMITING  ? ? ? ?Current Outpatient Medications  ?Medication Sig Dispense Refill  ? acetaminophen (TYLENOL) 325  MG tablet Take 2 tablets (650 mg total) by mouth every 6 (six) hours as needed for mild pain (or Fever >/= 101).    ? amLODipine (NORVASC) 5 MG tablet TAKE 1 TABLET(5 MG) BY MOUTH DAILY 90 tablet 0  ? aspirin EC 81 MG tablet Take 81 mg by mouth daily.    ? azithromycin (ZITHROMAX) 500 MG tablet Take 1 tablet (500 mg) one hour prior to all dental visits. 3 tablet 12  ? Boswellia-Glucosamine-Vit D (OSTEO BI-FLEX ONE PER DAY PO) Take 1 tablet by mouth daily.    ? Cholecalciferol (VITAMIN D-3) 125 MCG (5000 UT) TABS Take 1 tablet by mouth daily. 90 tablet 3  ? clopidogrel (PLAVIX) 75 MG tablet TAKE 1 TABLET(75 MG) BY MOUTH DAILY 90 tablet 3  ? Lactobacillus (PROBIOTIC ACIDOPHILUS PO) Take 1 tablet by mouth daily.    ? LORazepam (ATIVAN) 0.5 MG tablet TAKE 1 TABLET(0.5 MG) BY MOUTH AT BEDTIME 30 tablet 0  ? magnesium oxide (MAG-OX) 400 (240 Mg) MG tablet  TAKE 1 TABLET(400 MG) BY MOUTH DAILY 30 tablet 0  ? Menatetrenone (VITAMIN K2) 100 MCG TABS Take 100 mcg by mouth daily. 90 tablet 3  ? Multiple Vitamins-Minerals (MULTIVITAMIN WITH MINERALS) tablet T

## 2021-10-18 ENCOUNTER — Other Ambulatory Visit (HOSPITAL_BASED_OUTPATIENT_CLINIC_OR_DEPARTMENT_OTHER): Payer: Self-pay | Admitting: Family Medicine

## 2021-10-29 ENCOUNTER — Other Ambulatory Visit (HOSPITAL_BASED_OUTPATIENT_CLINIC_OR_DEPARTMENT_OTHER): Payer: Self-pay | Admitting: Family Medicine

## 2021-11-10 ENCOUNTER — Other Ambulatory Visit (HOSPITAL_BASED_OUTPATIENT_CLINIC_OR_DEPARTMENT_OTHER): Payer: Self-pay | Admitting: Family Medicine

## 2021-11-10 ENCOUNTER — Other Ambulatory Visit (HOSPITAL_BASED_OUTPATIENT_CLINIC_OR_DEPARTMENT_OTHER): Payer: Self-pay

## 2021-11-10 MED ORDER — AMLODIPINE BESYLATE 5 MG PO TABS: ORAL_TABLET | ORAL | 0 refills | Status: DC

## 2021-11-12 ENCOUNTER — Other Ambulatory Visit (HOSPITAL_BASED_OUTPATIENT_CLINIC_OR_DEPARTMENT_OTHER): Payer: Self-pay | Admitting: Family Medicine

## 2021-11-12 NOTE — Telephone Encounter (Signed)
Called pt and lvm to cb to sch 1-2 month f/u.

## 2021-12-14 ENCOUNTER — Other Ambulatory Visit (HOSPITAL_BASED_OUTPATIENT_CLINIC_OR_DEPARTMENT_OTHER): Payer: Self-pay | Admitting: Family Medicine

## 2021-12-21 ENCOUNTER — Other Ambulatory Visit (HOSPITAL_BASED_OUTPATIENT_CLINIC_OR_DEPARTMENT_OTHER): Payer: Self-pay | Admitting: Family Medicine

## 2021-12-27 ENCOUNTER — Other Ambulatory Visit: Payer: Self-pay | Admitting: Surgery

## 2021-12-28 ENCOUNTER — Ambulatory Visit
Admission: EM | Admit: 2021-12-28 | Discharge: 2021-12-28 | Disposition: A | Discharge status: Home / Self Care | Payer: Medicare Other | Attending: Nurse Practitioner | Admitting: Nurse Practitioner

## 2021-12-28 ENCOUNTER — Ambulatory Visit (INDEPENDENT_AMBULATORY_CARE_PROVIDER_SITE_OTHER): Payer: Medicare Other

## 2021-12-28 DIAGNOSIS — M25521 Pain in right elbow: Secondary | ICD-10-CM | POA: Diagnosis not present

## 2021-12-28 DIAGNOSIS — R21 Rash and other nonspecific skin eruption: Secondary | ICD-10-CM

## 2021-12-28 DIAGNOSIS — W19XXXA Unspecified fall, initial encounter: Secondary | ICD-10-CM

## 2021-12-28 MED ORDER — TRIAMCINOLONE ACETONIDE 0.5 % EX OINT: 1.0000 | TOPICAL_OINTMENT | Freq: Two times a day (BID) | CUTANEOUS | 0 refills | Status: DC

## 2021-12-28 MED ORDER — DEXAMETHASONE SODIUM PHOSPHATE 10 MG/ML IJ SOLN
10.0000 mg | Freq: Once | INTRAMUSCULAR | Status: AC
  Administered 2021-12-28: 10 mg via INTRAMUSCULAR

## 2021-12-28 NOTE — Discharge Instructions (Addendum)
-   Your right elbow is fractured - please wear the splint until you are able to follow-up with an orthopedic provider; contact information is below -You can use Tylenol 2 tablets every 6 hours as needed for pain; you can also apply ice over the splint to help with pain -We have given you a shot of steroid to help with the inflammation from the rash; you can use the steroid ointment twice daily as needed to help with the rash.  Follow-up with your primary care provider if not better after this treatment.

## 2021-12-28 NOTE — ED Provider Notes (Signed)
[facility]    CSN: [id number] Arrival date & time: 12/28/21  1134      History   Chief Complaint Chief Complaint  Patient presents with   Rash   Elbow Pain   Fall         HPI Michaela Simpson is a 88 y.o. female.   Patient presents with rash for the past couple of weeks.  Reports she noticed the rash after using a glue.  She has tried over-the-counter lotion, however the rash has spread up her arms into her neck.  She denies any shortness of breath, throat or tongue swelling, or mouth itching.  Denies oozing from the rash.  Reports the rash is itchy and red.  Also reports last week when she was getting up from her kitchen table, she fell on her right side.  Reports that her right elbow is painful, swollen, and very bruised.  She wants to make sure "it is not out of place."  Patient has full range of motion of her right upper extremity.  Also has bruising to her face.  Has not taken anything for the pain.  Patient reports no loss of consciousness, headache, vision changes since the fall.  Reports her pupils are unequal at baseline after bilateral cataract removal surgeries.   Past Medical History:  Diagnosis Date   Acute blood loss anemia 03/22/2015   Acute renal failure syndrome (HCC)    AKI (acute kidney injury) ([place])    Anemia    Aortic stenosis    Bilateral carpal tunnel syndrome 05/15/2018   Bruising    large bruise to left neck and T/O body   Chronic constipation 03/15/2019   Closed left humeral fracture 03/08/2019   Closed right hip fracture ([place]) 03/14/2015   Depression    Early cataract    Fracture of femoral neck, left, closed ([place]) 03/08/2019   Headache    Hip fracture (HCC)    HTN (hypertension)    Humerus fracture    Leukocytosis    Major depression, recurrent, chronic ([place]) 03/15/2019   Osteonecrosis ([place])    post-traumatic osteonecrosis of the femoral head of the left hip, retained orthopedic hardware ( order for conset)   Osteoporosis     Peripheral vascular disease ([place])    blood clot right leg   Protein-calorie malnutrition, severe 09/21/2019   Rheumatoid arthritis ([place])    S/P TAVR (transcatheter aortic valve replacement) 08/08/2019   Sequelae of open wound of right lower extremity 03/15/2019   Severe aortic stenosis 07/19/2019   Thoracic ascending aortic aneurysm (HCC)    4.4 cm by 07/26/19 CTA   Traumatic arthritis of left hip 10/02/2019   Ulcer of lower extremity ([place])    Wears glasses     Patient Active Problem List   Diagnosis Date Noted   Viral URI 04/16/2021   Insomnia 03/03/2021   Urinary incontinence 12/16/2020   Epistaxis 12/16/2020   Traumatic arthritis of left hip 10/02/2019   Status post total replacement of left hip 10/02/2019   Protein-calorie malnutrition, severe 09/21/2019   Closed pelvic fracture ([place]) 09/19/2019   S/P TAVR (transcatheter aortic valve replacement) 08/08/2019   Severe aortic stenosis 07/19/2019   Essential hypertension, benign 03/15/2019   Sequelae of open wound of right lower extremity 03/15/2019   Major depression, recurrent, chronic (HCC) 03/15/2019   Chronic constipation 03/15/2019   Closed left humeral fracture 03/08/2019   Wound of right leg, initial encounter 03/08/2019   Leukocytosis 03/08/2019   Fracture of  femoral neck, left, closed ([place]) 03/08/2019   AKI (acute kidney injury) ([place]) 03/08/2019   Depression    HTN (hypertension)    Bilateral carpal tunnel syndrome 05/15/2018   Rash and nonspecific skin eruption 04/06/2015   Acute blood loss anemia 03/22/2015   Edema 03/21/2015   Acute renal failure syndrome ([place])    Closed right hip fracture ([place]) 03/14/2015   Rheumatoid arthritis ([place]) 03/14/2015   Fall 03/14/2015    Past Surgical History:  Procedure Laterality Date   ABDOMINAL AORTOGRAM W/LOWER EXTREMITY N/A 05/08/2019   Procedure: ABDOMINAL AORTOGRAM W/LOWER EXTREMITY;  Surgeon: Serafina Mitchell, MD;  Location: [facility];  Service: Cardiovascular;   Laterality: N/A;   CATARACT EXTRACTION W/PHACO Left 10/24/2020   Procedure: CATARACT EXTRACTION PHACO AND INTRAOCULAR LENS PLACEMENT LEFT EYE;  Surgeon: Baruch Goldmann, MD;  Location[facility];  Service: Ophthalmology;  Laterality: Left;  CDE  20.05   CATARACT EXTRACTION W/PHACO Right 11/07/2020   Procedure: CATARACT EXTRACTION PHACO AND INTRAOCULAR LENS PLACEMENT RIGHT EYE;  Surgeon: Baruch Goldmann, MD;  Location: [facility];  Service: Ophthalmology;  Laterality: Right;  right, CDE=24.26   HARDWARE REMOVAL Left 10/02/2019   Procedure: HARDWARE REMOVAL;  Surgeon: Mcarthur Rossetti, MD;  Location: [facility];  Service: Orthopedics;  Laterality: Left;   HIP PINNING,CANNULATED Left 03/09/2019   Procedure: CANNULATED HIP PINNING;  Surgeon: Mcarthur Rossetti, MD;  Location: [facility];  Service: Orthopedics;  Laterality: Left;   INTRAMEDULLARY (IM) NAIL INTERTROCHANTERIC Right 03/14/2015   Procedure: RIGHT INTERTROCHANTRIC INTRAMEDULLARY (IM) NAIL ;  Surgeon: Mcarthur Rossetti, MD;  Location: [facility];  Service: Orthopedics;  Laterality: Right;   PERIPHERAL VASCULAR INTERVENTION Right 05/08/2019   Procedure: PERIPHERAL VASCULAR INTERVENTION;  Surgeon: Serafina Mitchell, MD;  Location: [facility];  Service: Cardiovascular;  Laterality: Right;   RIGHT/LEFT HEART CATH AND CORONARY ANGIOGRAPHY N/A 07/19/2019   Procedure: RIGHT/LEFT HEART CATH AND CORONARY ANGIOGRAPHY;  Surgeon: Sherren Mocha, MD;  Location: [facility];  Service: Cardiovascular;  Laterality: N/A;   TEE WITHOUT CARDIOVERSION N/A 08/07/2019   Procedure: TRANSESOPHAGEAL ECHOCARDIOGRAM (TEE);  Surgeon: Sherren Mocha, MD;  Location: [facility];  Service: Open Heart Surgery;  Laterality: N/A;   TOTAL HIP ARTHROPLASTY Left 10/02/2019   Procedure: LEFT TOTAL HIP ARTHROPLASTY ANTERIOR APPROACH AND REMOVAL OF CANNULATED SCREWS OF LEFT HIP;  Surgeon: Mcarthur Rossetti, MD;  Location: [facility];  Service: Orthopedics;  Laterality:  Left;   TRANSCATHETER AORTIC VALVE REPLACEMENT, TRANSFEMORAL  08/07/2019   TRANSCATHETER AORTIC VALVE REPLACEMENT, TRANSFEMORAL N/A 08/07/2019   Procedure: TRANSCATHETER AORTIC VALVE REPLACEMENT, TRANSFEMORAL;  Surgeon: Sherren Mocha, MD;  Location: [facility];  Service: Open Heart Surgery;  Laterality: N/A;    OB History   No obstetric history on file.      Home Medications    Prior to Admission medications   Medication Sig Start Date End Date Taking? Authorizing Provider  triamcinolone ointment (KENALOG) 0.5 % Apply 1 Application topically 2 (two) times daily. Do not use for more then 14 days in a row 12/28/21  Yes Eulogio Bear, NP  acetaminophen (TYLENOL) 325 MG tablet Take 2 tablets (650 mg total) by mouth every 6 (six) hours as needed for mild pain (or Fever >/= 101). 03/17/15   Debbe Odea, MD  amLODipine (NORVASC) 5 MG tablet TAKE 1 TABLET(5 MG) BY MOUTH DAILY 11/10/21   de [place], Raymond J, MD  aspirin EC 81 MG tablet Take 81 mg by  mouth daily.    [provider]  azithromycin (ZITHROMAX) 500 MG tablet Take 1 tablet (500 mg) one hour prior to all dental visits. 08/07/20   Eileen Stanford, PA-C  Boswellia-Glucosamine-Vit D (OSTEO BI-FLEX ONE PER DAY PO) Take 1 tablet by mouth daily.    [provider]  Cholecalciferol (VITAMIN D-3) 125 MCG (5000 UT) TABS Take 1 tablet by mouth daily. 09/17/19   Hilts, Michael, MD  clopidogrel (PLAVIX) 75 MG tablet TAKE 1 TABLET(75 MG) BY MOUTH DAILY 12/28/21   Waynetta Sandy, MD  Lactobacillus (PROBIOTIC ACIDOPHILUS PO) Take 1 tablet by mouth daily.    [provider]  LORazepam (ATIVAN) 0.5 MG tablet TAKE 1 TABLET(0.5 MG) BY MOUTH AT BEDTIME 12/21/21   de [place], Blondell Reveal, MD  magnesium oxide (MAG-OX) 400 (240 Mg) MG tablet TAKE 1 TABLET(400 MG) BY MOUTH DAILY 12/21/21   de [place], Blondell Reveal, MD  Menatetrenone (VITAMIN K2) 100 MCG TABS Take 100 mcg by mouth daily. 09/17/19   Hilts, Legrand Como, MD  Multiple  Vitamins-Minerals (MULTIVITAMIN WITH MINERALS) tablet Take 1 tablet by mouth daily.    [provider]  rosuvastatin (CRESTOR) 10 MG tablet TAKE 1 TABLET(10 MG) BY MOUTH AT BEDTIME 04/27/21   Serafina Mitchell, MD  sertraline (ZOLOFT) 100 MG tablet Take 150 mg by mouth daily.     [provider]  traMADol (ULTRAM) 50 MG tablet Take 1 tablet (50 mg total) by mouth every 6 (six) hours as needed. 09/11/19   Mcarthur Rossetti, MD  vitamin B-12 (CYANOCOBALAMIN) 1000 MCG tablet Take 1,000 mcg by mouth daily.    [provider]    Family History Family History  Problem Relation Age of Onset   Cancer Sister     Social History Social History   Tobacco Use   Smoking status: Never   Smokeless tobacco: Never  Vaping Use   Vaping Use: Never used  Substance Use Topics   Alcohol use: No   Drug use: No     Allergies   Penicillins and Codeine   Review of Systems Review of Systems Per HPI  Physical Exam Triage Vital Signs ED Triage Vitals  Enc Vitals Group     BP 12/28/21 1147 122/74     Pulse Rate 12/28/21 1147 84     Resp 12/28/21 1147 18     Temp 12/28/21 1147 98 F (36.7 C)     Temp Source 12/28/21 1147 Oral     SpO2 12/28/21 1147 96 %     Weight --      Height --      Head Circumference --      Peak Flow --      Pain Score 12/28/21 1145 8     Pain Loc --      Pain Edu? --      Excl. in [place]? --    No data found.  Updated Vital Signs BP 122/74 (BP Location: Right Arm)   Pulse 84   Temp 98 F (36.7 C) (Oral)   Resp 18   SpO2 96%   Visual Acuity Right Eye Distance:   Left Eye Distance:   Bilateral Distance:    Right Eye Near:   Left Eye Near:    Bilateral Near:     Physical Exam Vitals and nursing note reviewed.  Constitutional:      General: She is not in acute distress.    Appearance: Normal appearance. She is not toxic-appearing.  Pulmonary:  Effort: Pulmonary effort is normal. No respiratory distress.   Musculoskeletal:     Right elbow: Swelling present. No deformity, effusion or lacerations. Normal range of motion. Tenderness present.     Right forearm: Swelling and edema present. No tenderness or bony tenderness.     Comments: Inspection: Diffuse swelling and ecchymosis to the right upper extremity extending from the posterior forearm to the wrist; no obvious deformities or redness Palpation: No obvious deformities palpated; right elbow tender to palpation diffusely.  Nontender forearm. ROM: Full ROM to right upper extremity Strength: 5/5 right upper extremity Neurovascular: neurovascularly intact in left and right upper extremity   Skin:    General: Skin is warm and dry.     Capillary Refill: Capillary refill takes less than 2 seconds.     Coloration: Skin is not jaundiced or pale.     Findings: Bruising and rash present. No erythema. Rash is macular and papular.          Comments: Erythematous, macular papular rash to areas marked; no surrounding warmth, fluctuance, drainage, oozing   Neurological:     Mental Status: She is alert and oriented to person, place, and time.  Psychiatric:        Behavior: Behavior is cooperative.      UC Treatments / Results  Labs (all labs ordered are listed, but only abnormal results are displayed) Labs Reviewed - No data to display  EKG   Radiology DG Elbow Complete Right  Result Date: 12/28/2021 CLINICAL DATA:  Right elbow pain after fall 1 week ago EXAM: RIGHT ELBOW - COMPLETE 3+ VIEW COMPARISON:  None Available. FINDINGS: Acute essentially nondisplaced fracture through the base of the olecranon process of the proximal ulna with intra-articular extension into the elbow joint. Small to moderate-sized elbow joint effusion. No dislocation. Moderate to severe osteoarthritis of the right elbow. Soft tissue swelling posteriorly. IMPRESSION: 1. Acute essentially nondisplaced fracture through the base of the olecranon process of the proximal ulna  with intra-articular extension into the elbow joint. 2. Small to moderate-sized elbow joint effusion. Electronically Signed   By: Davina Poke D.O.   On: 12/28/2021 12:24    Procedures Procedures (including critical care time)  Medications Ordered in UC Medications  dexamethasone (DECADRON) injection 10 mg (has no administration in time range)    Initial Impression / Assessment and Plan / UC Course  I have reviewed the triage vital signs and the nursing notes.  Pertinent labs & imaging results that were available during my care of the patient were reviewed by me and considered in my medical decision making (see chart for details).    Patient is a very pleasant, fragile appearing 88 year old female presenting with rash and right elbow pain today.  Patient declines treatment with prednisone for the rash-states it made her cheeks swell in the past.  Will give one dose of dexamethasone 10 mg IM today in urgent care.  Also start topical corticosteroid ointment to the affected areas to help with itch.  Right elbow x-ray shows nondisplaced fracture through the base of olecranon process of the proximal ulna and small elbow joint effusion.  Patient was placed in a long-arm splint and a sling.  Supportive care discussed.  Encouraged close follow-up with orthopedic provider-contact information and handout given. Final Clinical Impressions(s) / UC Diagnoses   Final diagnoses:  Rash and nonspecific skin eruption  Right elbow pain     Discharge Instructions      - Your right elbow is fractured -  please wear the splint until you are able to follow-up with an orthopedic provider; contact information is below -You can use Tylenol 2 tablets every 6 hours as needed for pain; you can also apply ice over the splint to help with pain -We have given you a shot of steroid to help with the inflammation from the rash; you can use the steroid ointment twice daily as needed to help with the rash.  Follow-up  with your primary care provider if not better after this treatment.     ED Prescriptions     Medication Sig Dispense Auth. Provider   triamcinolone ointment (KENALOG) 0.5 % Apply 1 Application topically 2 (two) times daily. Do not use for more then 14 days in a row 30 g Eulogio Bear, NP      PDMP not reviewed this encounter.   Eulogio Bear, NP 12/28/21 1253

## 2021-12-28 NOTE — ED Triage Notes (Signed)
Pt reports itching rash in hands x 2 weeks using glue. Lotion gives no relief.   Pt reports pain and swelling in the right elbow after she fell 1 week ago. States she fell on the kitchen floor when she tried to stand up from a chair. Reports when she fell she stayed inn the floor x 1 hr, until she felt safe to stand up. Denies loose of consciousness.  Pt has a bruise in the left eye and reports soreness x 1 week after fall. Pt was not checked at a medical facility for the fall. Pt denies headache, nausea, vomiting, dizziness, vision changes.

## 2021-12-31 ENCOUNTER — Ambulatory Visit (INDEPENDENT_AMBULATORY_CARE_PROVIDER_SITE_OTHER): Payer: Medicare Other | Admitting: Physician Assistant

## 2021-12-31 ENCOUNTER — Encounter: Payer: Self-pay | Admitting: Physician Assistant

## 2021-12-31 DIAGNOSIS — S42401A Unspecified fracture of lower end of right humerus, initial encounter for closed fracture: Secondary | ICD-10-CM | POA: Diagnosis not present

## 2021-12-31 NOTE — Progress Notes (Signed)
Office Visit Note   Patient: Michaela Simpson           Date of Birth: Dec 11, 1931           MRN: [id number] Visit Date: 12/31/2021              Requested by: de [place], Raymond J, MD [address] [place],  [place] [address] PCP: de [place], Raymond J, MD   Assessment & Plan: Visit Diagnoses:  1. Elbow fracture, right, closed, initial encounter     Plan: We will keep her in the posterior splint for 2 weeks.  She is to keep the splint clean dry intact.  Sling for comfort.  We will remove the splint and obtain 3 views of the right elbow in 2 weeks.  Elevation wiggling fingers encouraged.  Questions were encouraged and answered.  Follow-Up Instructions: Return in about 2 weeks (around 01/14/2022).   Orders:  No orders of the defined types were placed in this encounter.  No orders of the defined types were placed in this encounter.     Procedures: No procedures performed   Clinical Data: No additional findings.   Subjective: Chief Complaint  Patient presents with   Right Elbow - Fracture    HPI Michaela Simpson is well-known to Dr. Delilah Shan service comes in today due to a right elbow fracture.  She reports that she fell approximately 10 days ago.  She was standing up from a chair and fell on her kitchen floor.  No loss of consciousness.  She was not seen until a week later in the ER when it was noted that she had some bruising about the elbow.  She was seen in the ER radiographs of her elbow were obtained I personally reviewed these radiographs which show a nondisplaced fracture at the base of the olecranon process.  There is intra-articular involvement.  She has moderate arthritic changes of the elbow joint.  No other fractures identified.  Moderate joint effusion. She is placed in a posterior long-arm splint and is referred here for follow-up.  She is taking Tylenol for pain.  She is right-hand dominant. Review of Systems See HPI otherwise negative  Objective: Vital Signs:  There were no vitals taken for this visit.  Physical Exam Constitutional:      Appearance: She is normal weight. She is not ill-appearing or diaphoretic.  Pulmonary:     Effort: Pulmonary effort is normal.  Neurological:     Mental Status: She is alert and oriented to person, place, and time.     Ortho Exam Right elbow: Posterior splint is clean dry intact.  She is able to wiggle her fingers and has full range of motion of the fingers full sensation.  Fingers are warm and dry. Specialty Comments:  No specialty comments available.  Imaging: No results found.   PMFS History: Patient Active Problem List   Diagnosis Date Noted   Viral URI 04/16/2021   Insomnia 03/03/2021   Urinary incontinence 12/16/2020   Epistaxis 12/16/2020   Traumatic arthritis of left hip 10/02/2019   Status post total replacement of left hip 10/02/2019   Protein-calorie malnutrition, severe 09/21/2019   Closed pelvic fracture ([place]) 09/19/2019   S/P TAVR (transcatheter aortic valve replacement) 08/08/2019   Severe aortic stenosis 07/19/2019   Essential hypertension, benign 03/15/2019   Sequelae of open wound of right lower extremity 03/15/2019   Major depression, recurrent, chronic ([place]) 03/15/2019   Chronic constipation 03/15/2019   Closed left humeral fracture 03/08/2019  Wound of right leg, initial encounter 03/08/2019   Leukocytosis 03/08/2019   Fracture of femoral neck, left, closed ([place]) 03/08/2019   AKI (acute kidney injury) ([place]) 03/08/2019   Depression    HTN (hypertension)    Bilateral carpal tunnel syndrome 05/15/2018   Rash and nonspecific skin eruption 04/06/2015   Acute blood loss anemia 03/22/2015   Edema 03/21/2015   Acute renal failure syndrome [facility])    Closed right hip fracture ([place]) 03/14/2015   Rheumatoid arthritis ([place]) 03/14/2015   Fall 03/14/2015   Past Medical History:  Diagnosis Date   Acute blood loss anemia 03/22/2015   Acute renal failure syndrome ([facility])    AKI  (acute kidney injury) ([place])    Anemia    Aortic stenosis    Bilateral carpal tunnel syndrome 05/15/2018   Bruising    large bruise to left neck and T/O body   Chronic constipation 03/15/2019   Closed left humeral fracture 03/08/2019   Closed right hip fracture ([place]) 03/14/2015   Depression    Early cataract    Fracture of femoral neck, left, closed ([place]) 03/08/2019   Headache    Hip fracture (H[facility])    HTN (hypertension)    Humerus fracture    Leukocytosis    Major depression, recurrent, chronic ([place]) 03/15/2019   Osteonecrosis ([place])    post-traumatic osteonecrosis of the femoral head of the left hip, retained orthopedic hardware ( order for conset)   Osteoporosis    Peripheral vascular disease ([place])    blood clot right leg   Protein-calorie malnutrition, severe 09/21/2019   Rheumatoid arthritis ([place])    S/P TAVR (transcatheter aortic valve replacement) 08/08/2019   Sequelae of open wound of right lower extremity 03/15/2019   Severe aortic stenosis 07/19/2019   Thoracic ascending aortic aneurysm (H[facility])    4.4 cm by 07/26/19 CTA   Traumatic arthritis of left hip 10/02/2019   Ulcer of lower extremity ([place])    Wears glasses     Family History  Problem Relation Age of Onset   Cancer Sister     Past Surgical History:  Procedure Laterality Date   ABDOMINAL AORTOGRAM W/LOWER EXTREMITY N/A 05/08/2019   Procedure: ABDOMINAL AORTOGRAM W/LOWER EXTREMITY;  Surgeon: Serafina Mitchell, MD;  Location: [facility];  Service: Cardiovascular;  Laterality: N/A;   CATARACT EXTRACTION W/PHACO Left 10/24/2020   Procedure: CATARACT EXTRACTION PHACO AND INTRAOCULAR LENS PLACEMENT LEFT EYE;  Surgeon: Baruch Goldmann, MD;  Location: [facility];  Service: Ophthalmology;  Laterality: Left;  CDE  20.05   CATARACT EXTRACTION W/PHACO Right 11/07/2020   Procedure: CATARACT EXTRACTION PHACO AND INTRAOCULAR LENS PLACEMENT RIGHT EYE;  Surgeon: Baruch Goldmann, MD;  Location: [facility];  Service: Ophthalmology;  Laterality:  Right;  right, CDE=24.26   HARDWARE REMOVAL Left 10/02/2019   Procedure: HARDWARE REMOVAL;  Surgeon: Mcarthur Rossetti, MD;  Location: [facility];  Service: Orthopedics;  Laterality: Left;   HIP PINNING,CANNULATED Left 03/09/2019   Procedure: CANNULATED HIP PINNING;  Surgeon: Mcarthur Rossetti, MD;  Location: [facility];  Service: Orthopedics;  Laterality: Left;   INTRAMEDULLARY (IM) NAIL INTERTROCHANTERIC Right 03/14/2015   Procedure: RIGHT INTERTROCHANTRIC INTRAMEDULLARY (IM) NAIL ;  Surgeon: Mcarthur Rossetti, MD;  Location: [facility];  Service: Orthopedics;  Laterality: Right;   PERIPHERAL VASCULAR INTERVENTION Right 05/08/2019   Procedure: PERIPHERAL VASCULAR INTERVENTION;  Surgeon: Serafina Mitchell, MD;  Location: [facility];  Service: Cardiovascular;  Laterality: Right;   RIGHT/LEFT HEART CATH AND CORONARY ANGIOGRAPHY  N/A 07/19/2019   Procedure: RIGHT/LEFT HEART CATH AND CORONARY ANGIOGRAPHY;  Surgeon: Sherren Mocha, MD;  Location: [facility];  Service: Cardiovascular;  Laterality: N/A;   TEE WITHOUT CARDIOVERSION N/A 08/07/2019   Procedure: TRANSESOPHAGEAL ECHOCARDIOGRAM (TEE);  Surgeon: Sherren Mocha, MD;  Location: [facility];  Service: Open Heart Surgery;  Laterality: N/A;   TOTAL HIP ARTHROPLASTY Left 10/02/2019   Procedure: LEFT TOTAL HIP ARTHROPLASTY ANTERIOR APPROACH AND REMOVAL OF CANNULATED SCREWS OF LEFT HIP;  Surgeon: Mcarthur Rossetti, MD;  Location: [facility];  Service: Orthopedics;  Laterality: Left;   TRANSCATHETER AORTIC VALVE REPLACEMENT, TRANSFEMORAL  08/07/2019   TRANSCATHETER AORTIC VALVE REPLACEMENT, TRANSFEMORAL N/A 08/07/2019   Procedure: TRANSCATHETER AORTIC VALVE REPLACEMENT, TRANSFEMORAL;  Surgeon: Sherren Mocha, MD;  Location: [facility];  Service: Open Heart Surgery;  Laterality: N/A;   Social History   Occupational History   Occupation: retired  Tobacco Use   Smoking status: Never   Smokeless tobacco: Never  Vaping  Use   Vaping Use: Never used  Substance and Sexual Activity   Alcohol use: No   Drug use: No   Sexual activity: Not on file

## 2022-01-11 ENCOUNTER — Ambulatory Visit: Payer: Medicare Other | Admitting: Physician Assistant

## 2022-01-17 ENCOUNTER — Encounter: Payer: Self-pay | Admitting: Cardiovascular Disease

## 2022-01-18 ENCOUNTER — Ambulatory Visit (INDEPENDENT_AMBULATORY_CARE_PROVIDER_SITE_OTHER): Payer: Medicare Other | Admitting: Physician Assistant

## 2022-01-18 ENCOUNTER — Encounter: Payer: Self-pay | Admitting: Physician Assistant

## 2022-01-18 ENCOUNTER — Ambulatory Visit (INDEPENDENT_AMBULATORY_CARE_PROVIDER_SITE_OTHER): Payer: Medicare Other

## 2022-01-18 DIAGNOSIS — S42401A Unspecified fracture of lower end of right humerus, initial encounter for closed fracture: Secondary | ICD-10-CM

## 2022-01-18 NOTE — Progress Notes (Signed)
HPI: [name] returns today for follow-up of her right elbow fracture.  She states she is having no pain.  She removed her own splint on July 23.  She is now 4 weeks status post injury.  She has been wearing the sling most of the time.  By her own report however she did have a fall since her last saw her while she was cleaning t her oven does not feel that she injured the elbow again.  Review of systems: See HPI otherwise negative or noncontributory.  Physical exam: General well-developed well-nourished female in no acute distress.  Right elbow: No abnormal warmth erythema skin breakdown.  Ecchymosis over the dorsal proximal ulnar.  She has almost fully extend the arm and flex it to at least 90 degrees.  Full range of motion of fingers right hand.  Radiographs: Right elbow 3 views: Elbow is well located.  Fracture site proximal ulna remains in overall satisfactory position.  There is some early sclerotic activity consistent with healing.  No other fractures identified.  Impression: Proximal ulnar fracture  Plan: She will wear the sling at all times except for bathing and coming out of the sling for gentle range of motion elbow.  No lifting with the right arm.  Also no driving, or activities that may lead to imbalance/fall.  Questions were encouraged and answered both patient and her daughters present today.  We will see her back in 4 weeks for repeat views of the right elbow.

## 2022-01-20 ENCOUNTER — Other Ambulatory Visit (HOSPITAL_BASED_OUTPATIENT_CLINIC_OR_DEPARTMENT_OTHER): Payer: Self-pay | Admitting: Family Medicine

## 2022-01-27 ENCOUNTER — Other Ambulatory Visit: Payer: Self-pay | Admitting: Surgery

## 2022-01-27 MED ORDER — ROSUVASTATIN CALCIUM 10 MG PO TABS: 10.0000 mg | ORAL_TABLET | Freq: Every day | ORAL | 1 refills | Status: DC

## 2022-02-04 ENCOUNTER — Other Ambulatory Visit (HOSPITAL_BASED_OUTPATIENT_CLINIC_OR_DEPARTMENT_OTHER): Payer: Self-pay | Admitting: Family Medicine

## 2022-02-15 ENCOUNTER — Encounter: Payer: Self-pay | Admitting: Physician Assistant

## 2022-02-15 ENCOUNTER — Ambulatory Visit (INDEPENDENT_AMBULATORY_CARE_PROVIDER_SITE_OTHER): Payer: Medicare Other

## 2022-02-15 ENCOUNTER — Ambulatory Visit (INDEPENDENT_AMBULATORY_CARE_PROVIDER_SITE_OTHER): Payer: Medicare Other | Admitting: Physician Assistant

## 2022-02-15 DIAGNOSIS — S42401A Unspecified fracture of lower end of right humerus, initial encounter for closed fracture: Secondary | ICD-10-CM

## 2022-02-15 NOTE — Progress Notes (Signed)
HPI: [name] returns today for follow-up of her right elbow fracture.  She is now 8 weeks status post injury.  States overall she is doing well.  She continues to wear the sling on her right arm.  Physical exam: Right elbow she has full range of motion of the right elbow.  Minimal tenderness over the proximal ulna region.  There is no rashes skin lesions ulcerations or impending ulcers.  Hand is neurovascular intact.  Radiographs: Right elbow 2 views: Fracture at the base of the olecranon process remains unchanged in overall position alignment.  There is interval healing with callus.  Fracture site still slightly visible.  No other fractures identified.  Impression: Right elbow fracture  Plan: She can come out of the sling at this point in time.  She is to do no heavy lifting or pulling with the arm.  She will follow-up with [place] in 4 weeks for repeat radiographs of the elbow.  Questions were encouraged and answered at length.

## 2022-02-16 ENCOUNTER — Other Ambulatory Visit (HOSPITAL_BASED_OUTPATIENT_CLINIC_OR_DEPARTMENT_OTHER): Payer: Self-pay | Admitting: Family Medicine

## 2022-03-03 ENCOUNTER — Other Ambulatory Visit (HOSPITAL_BASED_OUTPATIENT_CLINIC_OR_DEPARTMENT_OTHER): Payer: Self-pay | Admitting: Family Medicine

## 2022-03-09 ENCOUNTER — Ambulatory Visit: Payer: Medicare Other | Attending: Cardiovascular Disease | Admitting: Cardiovascular Disease

## 2022-03-09 ENCOUNTER — Encounter: Payer: Self-pay | Admitting: Cardiovascular Disease

## 2022-03-09 VITALS — BP 140/84 | HR 69 | Ht 60.0 in | Wt 109.2 lb

## 2022-03-09 DIAGNOSIS — Z952 Presence of prosthetic heart valve: Secondary | ICD-10-CM | POA: Diagnosis not present

## 2022-03-09 DIAGNOSIS — I2510 Atherosclerotic heart disease of native coronary artery without angina pectoris: Secondary | ICD-10-CM | POA: Insufficient documentation

## 2022-03-09 DIAGNOSIS — E782 Mixed hyperlipidemia: Secondary | ICD-10-CM | POA: Insufficient documentation

## 2022-03-09 MED ORDER — AZITHROMYCIN 500 MG PO TABS: ORAL_TABLET | ORAL | 12 refills | Status: DC

## 2022-03-09 NOTE — Patient Instructions (Signed)
Medication Instructions:  REFILLED Zithromax (azithromycin)  *If you need a refill on your cardiac medications before your next appointment, please call your pharmacy*   Lab Work: NONE If you have labs (blood work) drawn today and your tests are completely normal, you will receive your results only by: [place] (if you have MyChart) OR A paper copy in the mail If you have any lab test that is abnormal or we need to change your treatment, we will call you to review the results.   Testing/Procedures: ECHO (in 1 year, prior to visit) Your physician has requested that you have an echocardiogram. Echocardiography is a painless test that uses sound waves to create images of your heart. It provides your doctor with information about the size and shape of your heart and how well your heart's chambers and valves are working. This procedure takes approximately one hour. There are no restrictions for this procedure.  Follow-Up: At [facility], you and your health needs are our priority.  As part of our continuing mission to provide you with exceptional heart care, we have created designated Provider Care Teams.  These Care Teams include your primary Cardiologist (physician) and Advanced Practice Providers (APPs -  Physician Assistants and Nurse Practitioners) who all work together to provide you with the care you need, when you need it.  Your next appointment:   1 year(s)  The format for your next appointment:   In Person  Provider:   Sherren Mocha, MD       Important Information About [place]

## 2022-03-09 NOTE — Progress Notes (Signed)
Cardiology Office Note:    Date:  03/09/2022   ID:  Michaela, Simpson Jun 14, 1932, MRN [id number]  PCP:  de [place], Raymond J, MD   [place] Providers Cardiologist:  Sherren Mocha, MD     Referring MD: de [place], Blondell Reveal, MD   Chief Complaint  Patient presents with   S/P TAVR    History of Present Illness:    Michaela Simpson is a 88 y.o. female with a hx of severe aortic stenosis, hypertension, rheumatoid arthritis, and peripheral arterial disease.  She underwent TAVR in February 1751 without complication.  The patient was treated with a 23 mm Edwards SAPIEN 3 valve and was noted to have normal bioprosthetic aortic valve function on postoperative echo imaging.  The patient is here with her daughter today.  She has no chest pain, shortness of breath, or heart palpitations.  She denies leg swelling, orthopnea, or PND.  She is able to get out in her yard and do a little bit of work when she feels like it.  She has no specific complaints today.  Past Medical History:  Diagnosis Date   Acute blood loss anemia 03/22/2015   Acute renal failure syndrome (HCC)    AKI (acute kidney injury) ([place])    Anemia    Aortic stenosis    Bilateral carpal tunnel syndrome 05/15/2018   Bruising    large bruise to left neck and T/O body   Chronic constipation 03/15/2019   Closed left humeral fracture 03/08/2019   Closed right hip fracture ([place]) 03/14/2015   Depression    Early cataract    Fracture of femoral neck, left, closed ([place]) 03/08/2019   Headache    Hip fracture (HCC)    HTN (hypertension)    Humerus fracture    Leukocytosis    Major depression, recurrent, chronic ([place]) 03/15/2019   Osteonecrosis ([place])    post-traumatic osteonecrosis of the femoral head of the left hip, retained orthopedic hardware ( order for conset)   Osteoporosis    Peripheral vascular disease ([place])    blood clot right leg   Protein-calorie malnutrition, severe 09/21/2019   Rheumatoid arthritis  ([place])    S/P TAVR (transcatheter aortic valve replacement) 08/08/2019   Sequelae of open wound of right lower extremity 03/15/2019   Severe aortic stenosis 07/19/2019   Thoracic ascending aortic aneurysm (HCC)    4.4 cm by 07/26/19 CTA   Traumatic arthritis of left hip 10/02/2019   Ulcer of lower extremity ([place])    Wears glasses     Past Surgical History:  Procedure Laterality Date   ABDOMINAL AORTOGRAM W/LOWER EXTREMITY N/A 05/08/2019   Procedure: ABDOMINAL AORTOGRAM W/LOWER EXTREMITY;  Surgeon: Serafina Mitchell, MD;  Location: [facility];  Service: Cardiovascular;  Laterality: N/A;   CATARACT EXTRACTION W/PHACO Left 10/24/2020   Procedure: CATARACT EXTRACTION PHACO AND INTRAOCULAR LENS PLACEMENT LEFT EYE;  Surgeon: Baruch Goldmann, MD;  Location: [facility];  Service: Ophthalmology;  Laterality: Left;  CDE  20.05   CATARACT EXTRACTION W/PHACO Right 11/07/2020   Procedure: CATARACT EXTRACTION PHACO AND INTRAOCULAR LENS PLACEMENT RIGHT EYE;  Surgeon: Baruch Goldmann, MD;  Location: [facility];  Service: Ophthalmology;  Laterality: Right;  right, CDE=24.26   HARDWARE REMOVAL Left 10/02/2019   Procedure: HARDWARE REMOVAL;  Surgeon: Mcarthur Rossetti, MD;  Location: [facility];  Service: Orthopedics;  Laterality: Left;   HIP PINNING,CANNULATED Left 03/09/2019   Procedure: CANNULATED HIP PINNING;  Surgeon: Mcarthur Rossetti, MD;  Location:  [facility];  Service: Orthopedics;  Laterality: Left;   INTRAMEDULLARY (IM) NAIL INTERTROCHANTERIC Right 03/14/2015   Procedure: RIGHT INTERTROCHANTRIC INTRAMEDULLARY (IM) NAIL ;  Surgeon: Mcarthur Rossetti, MD;  Location: [facility];  Service: Orthopedics;  Laterality: Right;   PERIPHERAL VASCULAR INTERVENTION Right 05/08/2019   Procedure: PERIPHERAL VASCULAR INTERVENTION;  Surgeon: Serafina Mitchell, MD;  Location: [facility];  Service: Cardiovascular;  Laterality: Right;   RIGHT/LEFT HEART CATH AND CORONARY ANGIOGRAPHY N/A 07/19/2019   Procedure: RIGHT/LEFT  HEART CATH AND CORONARY ANGIOGRAPHY;  Surgeon: Sherren Mocha, MD;  Location: [facility];  Service: Cardiovascular;  Laterality: N/A;   TEE WITHOUT CARDIOVERSION N/A 08/07/2019   Procedure: TRANSESOPHAGEAL ECHOCARDIOGRAM (TEE);  Surgeon: Sherren Mocha, MD;  Location: [facility];  Service: Open Heart Surgery;  Laterality: N/A;   TOTAL HIP ARTHROPLASTY Left 10/02/2019   Procedure: LEFT TOTAL HIP ARTHROPLASTY ANTERIOR APPROACH AND REMOVAL OF CANNULATED SCREWS OF LEFT HIP;  Surgeon: Mcarthur Rossetti, MD;  Location: [facility];  Service: Orthopedics;  Laterality: Left;   TRANSCATHETER AORTIC VALVE REPLACEMENT, TRANSFEMORAL  08/07/2019   TRANSCATHETER AORTIC VALVE REPLACEMENT, TRANSFEMORAL N/A 08/07/2019   Procedure: TRANSCATHETER AORTIC VALVE REPLACEMENT, TRANSFEMORAL;  Surgeon: Sherren Mocha, MD;  Location: [facility];  Service: Open Heart Surgery;  Laterality: N/A;    Current Medications: Current Meds  Medication Sig   acetaminophen (TYLENOL) 325 MG tablet Take 2 tablets (650 mg total) by mouth every 6 (six) hours as needed for mild pain (or Fever >/= 101).   amLODipine (NORVASC) 5 MG tablet TAKE 1 TABLET(5 MG) BY MOUTH DAILY   aspirin EC 81 MG tablet Take 81 mg by mouth daily.   Boswellia-Glucosamine-Vit D (OSTEO BI-FLEX ONE PER DAY PO) Take 1 tablet by mouth daily.   Cholecalciferol (VITAMIN D-3) 125 MCG (5000 UT) TABS Take 1 tablet by mouth daily.   clopidogrel (PLAVIX) 75 MG tablet TAKE 1 TABLET(75 MG) BY MOUTH DAILY   Lactobacillus (PROBIOTIC ACIDOPHILUS PO) Take 1 tablet by mouth daily.   magnesium oxide (MAG-OX) 400 (240 Mg) MG tablet TAKE 1 TABLET(400 MG) BY MOUTH DAILY   Menatetrenone (VITAMIN K2) 100 MCG TABS Take 100 mcg by mouth daily.   rosuvastatin (CRESTOR) 10 MG tablet Take 1 tablet (10 mg total) by mouth daily.   sertraline (ZOLOFT) 100 MG tablet TAKE 1 AND 1/2 TABLETS BY MOUTH EVERY DAY   vitamin B-12 (CYANOCOBALAMIN) 1000 MCG tablet Take 1,000 mcg  by mouth daily.   [DISCONTINUED] azithromycin (ZITHROMAX) 500 MG tablet Take 1 tablet (500 mg) one hour prior to all dental visits.     Allergies:   Penicillins and Codeine   Social History   Socioeconomic History   Marital status: Widowed    Spouse name: Not on file   Number of children: Not on file   Years of education: Not on file   Highest education level: Not on file  Occupational History   Occupation: retired  Tobacco Use   Smoking status: Never   Smokeless tobacco: Never  Vaping Use   Vaping Use: Never used  Substance and Sexual Activity   Alcohol use: No   Drug use: No   Sexual activity: Not on file  Other Topics Concern   Not on file  Social History Narrative   Not on file   Social Determinants of Health   Financial Resource Strain: Low Risk  (03/09/2019)   Overall Financial Resource Strain (CARDIA)    Difficulty of Paying Living  Expenses: Not hard at all  Food Insecurity: No Food Insecurity (03/09/2019)   Hunger Vital Sign    Worried About Running Out of Food in the Last Year: Never true    Ran Out of Food in the Last Year: Never true  Transportation Needs: No Transportation Needs (03/09/2019)   PRAPARE - Hydrologist (Medical): No    Lack of Transportation (Non-Medical): No  Physical Activity: Unknown (03/09/2019)   Exercise Vital Sign    Days of Exercise per Week: Patient refused    Minutes of Exercise per Session: Patient refused  Stress: No Stress Concern Present (03/09/2019)   [place]    Feeling of Stress : Not at all  Social Connections: Unknown (03/09/2019)   Social Connection and Isolation Panel [NHANES]    Frequency of Communication with Friends and Family: Patient refused    Frequency of Social Gatherings with Friends and Family: Patient refused    Attends Religious Services: Patient refused    Marine scientist or Organizations: Patient refused     Attends Archivist Meetings: Patient refused    Marital Status: Patient refused     Family History: The patient's family history includes Cancer in her sister.  ROS:   Please see the history of present illness.    All other systems reviewed and are negative.  EKGs/Labs/Other Studies Reviewed:    The following studies were reviewed today: Echo 08/07/2020: 1. Left ventricular ejection fraction, by estimation, is 60 to 65%. The  left ventricle has normal function. The left ventricle has no regional  wall motion abnormalities. Left ventricular diastolic parameters are  indeterminate.   2. Right ventricular systolic function is normal. The right ventricular  size is normal.   3. The mitral valve is normal in structure. Trivial mitral valve  regurgitation.   4. S/p TAVR (23 mm Edwards Sapien valve prosthesis (procedure date:  08/07/19). Peak and mean gradients through the valve are 15 and 8 mm Hg  respectively. No significant change from report of 09/13/19. . The aortic  valve has been repaired/replaced. Aortic  valve regurgitation is not visualized.   5. Aorta measures slightly smaller than from study of 09/13/19. There is  mild dilatation of the ascending aorta, measuring 42 mm.   6. The inferior vena cava is normal in size with greater than 50%  respiratory variability, suggesting right atrial pressure of 3 mmHg.   EKG:  EKG is ordered today.  The ekg ordered today demonstrates NSR 69 bpm, RSR' suggest RV conduction delay  Recent Labs: 05/07/2021: ALT 20; BUN 23; Creatinine, Ser 1.08; Hemoglobin 13.7; Platelets 264; Potassium 4.8; Sodium 145  Recent Lipid Panel No results found for: "CHOL", "TRIG", "HDL", "CHOLHDL", "VLDL", "LDLCALC", "LDLDIRECT"   Risk Assessment/Calculations:            Physical Exam:    VS:  BP (!) 140/84   Pulse 69   Ht 5' (1.524 m)   Wt 109 lb 3.2 oz (49.5 kg)   SpO2 98%   BMI 21.33 kg/m     Wt Readings from Last 3 Encounters:   03/09/22 109 lb 3.2 oz (49.5 kg)  10/05/21 112 lb (50.8 kg)  05/07/21 111 lb (50.3 kg)     GEN:  Well nourished, well developed in no acute distress HEENT: Normal NECK: No JVD; No carotid bruits LYMPHATICS: No lymphadenopathy CARDIAC: RRR, 2/6 early peaking systolic ejection murmur at the right  upper sternal border, no diastolic murmur RESPIRATORY:  Clear to auscultation without rales, wheezing or rhonchi  ABDOMEN: Soft, non-tender, non-distended MUSCULOSKELETAL:  No edema; No deformity  SKIN: Warm and dry NEUROLOGIC:  Alert and oriented x 3 PSYCHIATRIC:  Normal affect   ASSESSMENT:    1. S/P TAVR (transcatheter aortic valve replacement)   2. Coronary artery disease involving native coronary artery of native heart without angina pectoris   3. Mixed hyperlipidemia    PLAN:    In order of problems listed above:  Patient appears clinically stable.  We will refill her a Zithromax for as needed use with SBE prophylaxis.  She remains on aspirin for antiplatelet therapy.  I will check a 2D echocardiogram prior to her return office visit next year. Noted to have diagonal stenosis on catheterization.  No anginal symptoms.  Treated with amlodipine, aspirin, and rosuvastatin. Treated with rosuvastatin.  Patient's daughter questions the need to continue this considering the patient's advanced age.  I reviewed her history and considering her history of SFA revascularization and aortic atherosclerosis as well as coronary artery disease, I recommended to continue low-dose rosuvastatin.  Her LDL cholesterol is 49 mg/dL and she seems to be tolerating the medication well.           Medication Adjustments/Labs and Tests Ordered: Current medicines are reviewed at length with the patient today.  Concerns regarding medicines are outlined above.  Orders Placed This Encounter  Procedures   EKG 12-Lead   ECHOCARDIOGRAM COMPLETE   Meds ordered this encounter  Medications   azithromycin  (ZITHROMAX) 500 MG tablet    Sig: Take 1 tablet (500 mg) one hour prior to all dental visits.    Dispense:  3 tablet    Refill:  12    Patient Instructions  Medication Instructions:  REFILLED Zithromax (azithromycin)  *If you need a refill on your cardiac medications before your next appointment, please call your pharmacy*   Lab Work: NONE If you have labs (blood work) drawn today and your tests are completely normal, you will receive your results only by: [place] (if you have MyChart) OR A paper copy in the mail If you have any lab test that is abnormal or we need to change your treatment, we will call you to review the results.   Testing/Procedures: ECHO (in 1 year, prior to visit) Your physician has requested that you have an echocardiogram. Echocardiography is a painless test that uses sound waves to create images of your heart. It provides your doctor with information about the size and shape of your heart and how well your heart's chambers and valves are working. This procedure takes approximately one hour. There are no restrictions for this procedure.  Follow-Up: At [facility], you and your health needs are our priority.  As part of our continuing mission to provide you with exceptional heart care, we have created designated Provider Care Teams.  These Care Teams include your primary Cardiologist (physician) and Advanced Practice Providers (APPs -  Physician Assistants and Nurse Practitioners) who all work together to provide you with the care you need, when you need it.  Your next appointment:   1 year(s)  The format for your next appointment:   In Person  Provider:   Sherren Mocha, MD       Important Information About Sugar         Signed, Sherren Mocha, MD  03/09/2022 1:19 PM    [place]

## 2022-03-15 ENCOUNTER — Ambulatory Visit: Payer: Medicare Other | Admitting: Physician Assistant

## 2022-06-16 ENCOUNTER — Other Ambulatory Visit (HOSPITAL_BASED_OUTPATIENT_CLINIC_OR_DEPARTMENT_OTHER): Payer: Self-pay | Admitting: Family Medicine

## 2022-07-14 ENCOUNTER — Other Ambulatory Visit (HOSPITAL_BASED_OUTPATIENT_CLINIC_OR_DEPARTMENT_OTHER): Payer: Self-pay

## 2022-07-14 DIAGNOSIS — Z0000 Encounter for general adult medical examination without abnormal findings: Secondary | ICD-10-CM

## 2022-07-14 MED ORDER — MAGNESIUM OXIDE -MG SUPPLEMENT 400 (240 MG) MG PO TABS: ORAL_TABLET | ORAL | 0 refills | Status: DC

## 2022-07-20 ENCOUNTER — Other Ambulatory Visit (HOSPITAL_BASED_OUTPATIENT_CLINIC_OR_DEPARTMENT_OTHER): Payer: Self-pay

## 2022-07-20 DIAGNOSIS — F320 Major depressive disorder, single episode, mild: Secondary | ICD-10-CM

## 2022-07-20 MED ORDER — SERTRALINE HCL 100 MG PO TABS: 150.0000 mg | ORAL_TABLET | Freq: Every day | ORAL | 1 refills | Status: DC

## 2022-07-27 ENCOUNTER — Ambulatory Visit (INDEPENDENT_AMBULATORY_CARE_PROVIDER_SITE_OTHER): Payer: Medicare Other

## 2022-07-27 ENCOUNTER — Other Ambulatory Visit (HOSPITAL_BASED_OUTPATIENT_CLINIC_OR_DEPARTMENT_OTHER): Payer: Self-pay

## 2022-07-27 ENCOUNTER — Encounter (HOSPITAL_BASED_OUTPATIENT_CLINIC_OR_DEPARTMENT_OTHER): Payer: Self-pay

## 2022-07-27 VITALS — Ht 61.52 in | Wt 112.0 lb

## 2022-07-27 DIAGNOSIS — Z0000 Encounter for general adult medical examination without abnormal findings: Secondary | ICD-10-CM

## 2022-07-27 DIAGNOSIS — E785 Hyperlipidemia, unspecified: Secondary | ICD-10-CM

## 2022-07-27 MED ORDER — ROSUVASTATIN CALCIUM 10 MG PO TABS: 10.0000 mg | ORAL_TABLET | Freq: Every day | ORAL | 1 refills | Status: DC

## 2022-07-27 NOTE — Patient Instructions (Addendum)
Michaela Simpson , Thank you for taking time to come for your Medicare Wellness Visit. I appreciate your ongoing commitment to your health goals. Please review the following plan we discussed and let me know if I can assist you in the future.   These are the goals we discussed:  Goals       No current goals (pt-stated)        This is a list of the screening recommended for you and due dates:  Health Maintenance  Topic Date Due   COVID-19 Vaccine (1) 08/12/2022*   Flu Shot  09/19/2022*   Zoster (Shingles) Vaccine (1 of 2) 10/25/2022*   Pneumonia Vaccine (1 - PCV) 07/28/2023*   Medicare Annual Wellness Visit  07/28/2023   DTaP/Tdap/Td vaccine (2 - Td or Tdap) 03/27/2029   DEXA scan (bone density measurement)  Completed   HPV Vaccine  Aged Out  *Topic was postponed. The date shown is not the original due date.  Opioid Pain Medicine Management Opioids are powerful medicines that are used to treat moderate to severe pain. When used for short periods of time, they can help you to: Sleep better. Do better in physical or occupational therapy. Feel better in the first few days after an injury. Recover from surgery. Opioids should be taken with the supervision of a trained health care provider. They should be taken for the shortest period of time possible. This is because opioids can be addictive, and the longer you take opioids, the greater your risk of addiction. This addiction can also be called opioid use disorder. What are the risks? Using opioid pain medicines for longer than 3 days increases your risk of side effects. Side effects include: Constipation. Nausea and vomiting. Breathing difficulties (respiratory depression). Drowsiness. Confusion. Opioid use disorder. Itching. Taking opioid pain medicine for a long period of time can affect your ability to do daily tasks. It also puts you at risk for: Motor vehicle crashes. Depression. Suicide. Heart attack. Overdose, which can be  life-threatening. What is a pain treatment plan? A pain treatment plan is an agreement between you and your health care provider. Pain is unique to each person, and treatments vary depending on your condition. To manage your pain, you and your health care provider need to work together. To help you do this: Discuss the goals of your treatment, including how much pain you might expect to have and how you will manage the pain. Review the risks and benefits of taking opioid medicines. Remember that a good treatment plan uses more than one approach and minimizes the chance of side effects. Be honest about the amount of medicines you take and about any drug or alcohol use. Get pain medicine prescriptions from only one health care provider. Pain can be managed with many types of alternative treatments. Ask your health care provider to refer you to one or more specialists who can help you manage pain through: Physical or occupational therapy. Counseling (cognitive behavioral therapy). Good nutrition. Biofeedback. Massage. Meditation. Non-opioid medicine. Following a gentle exercise program. How to use opioid pain medicine Taking medicine Take your pain medicine exactly as told by your health care provider. Take it only when you need it. If your pain gets less severe, you may take less than your prescribed dose if your health care provider approves. If you are not having pain, do nottake pain medicine unless your health care provider tells you to take it. If your pain is severe, do nottry to treat it yourself by  taking more pills than instructed on your prescription. Contact your health care provider for help. Write down the times when you take your pain medicine. It is easy to become confused while on pain medicine. Writing the time can help you avoid overdose. Take other over-the-counter or prescription medicines only as told by your health care provider. Keeping yourself and others safe  While  you are taking opioid pain medicine: Do not drive, use machinery, or power tools. Do not sign legal documents. Do not drink alcohol. Do not take sleeping pills. Do not supervise children by yourself. Do not do activities that require climbing or being in high places. Do not go to a lake, river, ocean, spa, or swimming pool. Do not share your pain medicine with anyone. Keep pain medicine in a locked cabinet or in a secure area where pets and children cannot reach it. Stopping your use of opioids If you have been taking opioid medicine for more than a few weeks, you may need to slowly decrease (taper) how much you take until you stop completely. Tapering your use of opioids can decrease your risk of symptoms of withdrawal, such as: Pain and cramping in the abdomen. Nausea. Sweating. Sleepiness. Restlessness. Uncontrollable shaking (tremors). Cravings for the medicine. Do not attempt to taper your use of opioids on your own. Talk with your health care provider about how to do this. Your health care provider may prescribe a step-down schedule based on how much medicine you are taking and how long you have been taking it. Getting rid of leftover pills Do not save any leftover pills. Get rid of leftover pills safely by: Taking the medicine to a prescription take-back program. This is usually offered by the county or law enforcement. Bringing them to a pharmacy that has a drug disposal container. Flushing them down the toilet. Check the label or package insert of your medicine to see whether this is safe to do. Throwing them out in the trash. Check the label or package insert of your medicine to see whether this is safe to do. If it is safe to throw it out, remove the medicine from the original container, put it into a sealable bag or container, and mix it with used coffee grounds, food scraps, dirt, or cat litter before putting it in the trash. Follow these instructions at home: Activity Do  exercises as told by your health care provider. Avoid activities that make your pain worse. Return to your normal activities as told by your health care provider. Ask your health care provider what activities are safe for you. General instructions You may need to take these actions to prevent or treat constipation: Drink enough fluid to keep your urine pale yellow. Take over-the-counter or prescription medicines. Eat foods that are high in fiber, such as beans, whole grains, and fresh fruits and vegetables. Limit foods that are high in fat and processed sugars, such as fried or sweet foods. Keep all follow-up visits. This is important. Where to find support If you have been taking opioids for a long time, you may benefit from receiving support for quitting from a local support group or counselor. Ask your health care provider for a referral to these resources in your area. Where to find more information Centers for Disease Control and Prevention (CDC): http://www.wolf.info/ [place] Food and Drug Administration (FDA): [employer] Get help right away if: You may have taken too much of an opioid (overdosed). Common symptoms of an overdose: Your breathing is slower or more  shallow than normal. You have a very slow heartbeat (pulse). You have slurred speech. You have nausea and vomiting. Your pupils become very small. You have other potential symptoms: You are very confused. You faint or feel like you will faint. You have cold, clammy skin. You have blue lips or fingernails. You have thoughts of harming yourself or harming others. These symptoms may represent a serious problem that is an emergency. Do not wait to see if the symptoms will go away. Get medical help right away. Call your local emergency services (911 in the [place] Do not drive yourself to the hospital.  If you ever feel like you may hurt yourself or others, or have thoughts about taking your own life, get help right away. Go to your nearest  emergency department or: Call your local emergency services (911 in the [place] Call the [facility] [phone] in the [place] Call a suicide crisis helpline, such as the [place] at 973-033-2568 or 988 in the [place]. This is open 24 hours a day in the [place] Text the Crisis Text Line at 402-001-6156 (in the [place].). Summary Opioid medicines can help you manage moderate to severe pain for a short period of time. A pain treatment plan is an agreement between you and your health care provider. Discuss the goals of your treatment, including how much pain you might expect to have and how you will manage the pain. If you think that you or someone else may have taken too much of an opioid, get medical help right away. This information is not intended to replace advice given to you by your health care provider. Make sure you discuss any questions you have with your health care provider. Document Revised: 12/31/2020 Document Reviewed: 09/17/2020 Elsevier Patient Education  [place] directives: In chart  Conditions/risks identified: None  Next appointment: Follow up in one year for your annual wellness visit     Preventive Care 65 Years and Older, Female Preventive care refers to lifestyle choices and visits with your health care provider that can promote health and wellness. What does preventive care include? A yearly physical exam. This is also called an annual well check. Dental exams once or twice a year. Routine eye exams. Ask your health care provider how often you should have your eyes checked. Personal lifestyle choices, including: Daily care of your teeth and gums. Regular physical activity. Eating a healthy diet. Avoiding tobacco and drug use. Limiting alcohol use. Practicing safe sex. Taking low-dose aspirin every day. Taking vitamin and mineral supplements as recommended by your health care provider. What happens  during an annual well check? The services and screenings done by your health care provider during your annual well check will depend on your age, overall health, lifestyle risk factors, and family history of disease. Counseling  Your health care provider may ask you questions about your: Alcohol use. Tobacco use. Drug use. Emotional well-being. Home and relationship well-being. Sexual activity. Eating habits. History of falls. Memory and ability to understand (cognition). Work and work Statistician. Reproductive health. Screening  You may have the following tests or measurements: Height, weight, and BMI. Blood pressure. Lipid and cholesterol levels. These may be checked every 5 years, or more frequently if you are over 6 years old. Skin check. Lung cancer screening. You may have this screening every year starting at age 34 if you have a 30-pack-year history of smoking and currently smoke or have quit within the past  15 years. Fecal occult blood test (FOBT) of the stool. You may have this test every year starting at age 24. Flexible sigmoidoscopy or colonoscopy. You may have a sigmoidoscopy every 5 years or a colonoscopy every 10 years starting at age 72. Hepatitis C blood test. Hepatitis B blood test. Sexually transmitted disease (STD) testing. Diabetes screening. This is done by checking your blood sugar (glucose) after you have not eaten for a while (fasting). You may have this done every 1-3 years. Bone density scan. This is done to screen for osteoporosis. You may have this done starting at age 39. Mammogram. This may be done every 1-2 years. Talk to your health care provider about how often you should have regular mammograms. Talk with your health care provider about your test results, treatment options, and if necessary, the need for more tests. Vaccines  Your health care provider may recommend certain vaccines, such as: Influenza vaccine. This is recommended every  year. Tetanus, diphtheria, and acellular pertussis (Tdap, Td) vaccine. You may need a Td booster every 10 years. Zoster vaccine. You may need this after age 64. Pneumococcal 13-valent conjugate (PCV13) vaccine. One dose is recommended after age 24. Pneumococcal polysaccharide (PPSV23) vaccine. One dose is recommended after age 76. Talk to your health care provider about which screenings and vaccines you need and how often you need them. This information is not intended to replace advice given to you by your health care provider. Make sure you discuss any questions you have with your health care provider. Document Released: 07/04/2015 Document Revised: 02/25/2016 Document Reviewed: 04/08/2015 Elsevier Interactive Patient Education  2017 [place] Prevention in the Home Falls can cause injuries. They can happen to people of all ages. There are many things you can do to make your home safe and to help prevent falls. What can I do on the outside of my home? Regularly fix the edges of walkways and driveways and fix any cracks. Remove anything that might make you trip as you walk through a door, such as a raised step or threshold. Trim any bushes or trees on the path to your home. Use bright outdoor lighting. Clear any walking paths of anything that might make someone trip, such as rocks or tools. Regularly check to see if handrails are loose or broken. Make sure that both sides of any steps have handrails. Any raised decks and porches should have guardrails on the edges. Have any leaves, snow, or ice cleared regularly. Use sand or salt on walking paths during winter. Clean up any spills in your garage right away. This includes oil or grease spills. What can I do in the bathroom? Use night lights. Install grab bars by the toilet and in the tub and shower. Do not use towel bars as grab bars. Use non-skid mats or decals in the tub or shower. If you need to sit down in the shower, use a  plastic, non-slip stool. Keep the floor dry. Clean up any water that spills on the floor as soon as it happens. Remove soap buildup in the tub or shower regularly. Attach bath mats securely with double-sided non-slip rug tape. Do not have throw rugs and other things on the floor that can make you trip. What can I do in the bedroom? Use night lights. Make sure that you have a light by your bed that is easy to reach. Do not use any sheets or blankets that are too big for your bed. They should not hang  down onto the floor. Have a firm chair that has side arms. You can use this for support while you get dressed. Do not have throw rugs and other things on the floor that can make you trip. What can I do in the kitchen? Clean up any spills right away. Avoid walking on wet floors. Keep items that you use a lot in easy-to-reach places. If you need to reach something above you, use a strong step stool that has a grab bar. Keep electrical cords out of the way. Do not use floor polish or wax that makes floors slippery. If you must use wax, use non-skid floor wax. Do not have throw rugs and other things on the floor that can make you trip. What can I do with my stairs? Do not leave any items on the stairs. Make sure that there are handrails on both sides of the stairs and use them. Fix handrails that are broken or loose. Make sure that handrails are as long as the stairways. Check any carpeting to make sure that it is firmly attached to the stairs. Fix any carpet that is loose or worn. Avoid having throw rugs at the top or bottom of the stairs. If you do have throw rugs, attach them to the floor with carpet tape. Make sure that you have a light switch at the top of the stairs and the bottom of the stairs. If you do not have them, ask someone to add them for you. What else can I do to help prevent falls? Wear shoes that: Do not have high heels. Have rubber bottoms. Are comfortable and fit you  well. Are closed at the toe. Do not wear sandals. If you use a stepladder: Make sure that it is fully opened. Do not climb a closed stepladder. Make sure that both sides of the stepladder are locked into place. Ask someone to hold it for you, if possible. Clearly mark and make sure that you can see: Any grab bars or handrails. First and last steps. Where the edge of each step is. Use tools that help you move around (mobility aids) if they are needed. These include: Canes. Walkers. Scooters. Crutches. Turn on the lights when you go into a dark area. Replace any light bulbs as soon as they burn out. Set up your furniture so you have a clear path. Avoid moving your furniture around. If any of your floors are uneven, fix them. If there are any pets around you, be aware of where they are. Review your medicines with your doctor. Some medicines can make you feel dizzy. This can increase your chance of falling. Ask your doctor what other things that you can do to help prevent falls. This information is not intended to replace advice given to you by your health care provider. Make sure you discuss any questions you have with your health care provider. Document Released: 04/03/2009 Document Revised: 11/13/2015 Document Reviewed: 07/12/2014 Elsevier Interactive Patient Education  2017 [employer].

## 2022-07-27 NOTE — Progress Notes (Addendum)
Subjective:   Michaela Simpson is a 89 y.o. female who presents for Medicare Annual (Subsequent) preventive examination.  Review of Systems    Virtual Visit via Telephone Note  I connected with  Michaela Simpson on 07/27/22 at  1:30 PM EST by telephone and verified that I am speaking with the correct person using two identifiers.  Location: Patient: Home Provider: Office Persons participating in the virtual visit: patient/Nurse Health Advisor   I discussed the limitations, risks, security and privacy concerns of performing an evaluation and management service by telephone and the availability of in person appointments. The patient expressed understanding and agreed to proceed.  Interactive audio and video telecommunications were attempted between this nurse and patient, however failed, due to patient having technical difficulties OR patient did not have access to video capability.  We continued and completed visit with audio only.  Some vital signs may be absent or patient reported.   Michaela Peaches, LPN  Cardiac Risk Factors include: advanced age (>27mn, >>45women);hypertension     Objective:    Today's Vitals   07/27/22 1342  Weight: 112 lb (50.8 kg)  Height: 5' 1.52" (1.563 m)   Body mass index is 20.8 kg/m.     07/27/2022    1:53 PM 10/24/2020   12:08 PM 10/04/2019    8:16 AM 09/26/2019    1:18 PM 09/19/2019   11:00 PM 08/07/2019    8:17 AM 08/03/2019    1:30 PM  Advanced Directives  Does Patient Have a Medical Advance Directive? Yes Yes Yes No Yes Yes Yes  Type of AParamedicof [place]ho[place]iaLiving will Living will;Healthcare Power of [place] will [place] will Living will HPoint VentureLiving will HFountainLiving will  Does patient want to make changes to medical advance directive? No - Patient declined No - Patient declined No - Patient  declined No - Patient declined No - Guardian declined No - Patient declined No - Patient declined  Copy of [place]in Chart? Yes - validated most recent copy scanned in chart (See row information)  No - copy requested No - copy requested  No - copy requested     Current Medications (verified) Outpatient Encounter Medications as of 07/27/2022  Medication Sig   acetaminophen (TYLENOL) 325 MG tablet Take 2 tablets (650 mg total) by mouth every 6 (six) hours as needed for mild pain (or Fever >/= 101).   amLODipine (NORVASC) 5 MG tablet TAKE 1 TABLET(5 MG) BY MOUTH DAILY   aspirin EC 81 MG tablet Take 81 mg by mouth daily.   azithromycin (ZITHROMAX) 500 MG tablet Take 1 tablet (500 mg) one hour prior to all dental visits.   Boswellia-Glucosamine-Vit D (OSTEO BI-FLEX ONE PER DAY PO) Take 1 tablet by mouth daily.   Cholecalciferol (VITAMIN D-3) 125 MCG (5000 UT) TABS Take 1 tablet by mouth daily.   clopidogrel (PLAVIX) 75 MG tablet TAKE 1 TABLET(75 MG) BY MOUTH DAILY   Lactobacillus (PROBIOTIC ACIDOPHILUS PO) Take 1 tablet by mouth daily.   magnesium oxide (MAG-OX) 400 (240 Mg) MG tablet TAKE 1 TABLET(400 MG) BY MOUTH DAILY   Menatetrenone (VITAMIN K2) 100 MCG TABS Take 100 mcg by mouth daily.   rosuvastatin (CRESTOR) 10 MG tablet Take 1 tablet (10 mg total) by mouth daily.   sertraline (ZOLOFT) 100 MG tablet Take 1.5 tablets (150 mg total) by mouth daily.   vitamin B-12 (  CYANOCOBALAMIN) 1000 MCG tablet Take 1,000 mcg by mouth daily.   [DISCONTINUED] LORazepam (ATIVAN) 0.5 MG tablet TAKE 1 TABLET(0.5 MG) BY MOUTH AT BEDTIME (Patient not taking: Reported on 03/09/2022)   [DISCONTINUED] traMADol (ULTRAM) 50 MG tablet Take 1 tablet (50 mg total) by mouth every 6 (six) hours as needed. (Patient not taking: Reported on 03/09/2022)   [DISCONTINUED] triamcinolone ointment (KENALOG) 0.5 % Apply 1 Application topically 2 (two) times daily. Do not use for more then 14 days in a row (Patient not  taking: Reported on 03/09/2022)   No facility-administered encounter medications on file as of 07/27/2022.    Allergies (verified) Penicillins and Codeine   History: Past Medical History:  Diagnosis Date   Acute blood loss anemia 03/22/2015   Acute renal failure syndrome (HCC)    AKI (acute kidney injury) ([place])    Anemia    Aortic stenosis    Bilateral carpal tunnel syndrome 05/15/2018   Bruising    large bruise to left neck and T/O body   Chronic constipation 03/15/2019   Closed left humeral fracture 03/08/2019   Closed right hip fracture ([place]) 03/14/2015   Depression    Early cataract    Fracture of femoral neck, left, closed ([place]) 03/08/2019   Headache    Hip fracture (HCC)    HTN (hypertension)    Humerus fracture    Leukocytosis    Major depression, recurrent, chronic ([place]) 03/15/2019   Osteonecrosis ([place])    post-traumatic osteonecrosis of the femoral head of the left hip, retained orthopedic hardware ( order for conset)   Osteoporosis    Peripheral vascular disease ([place])    blood clot right leg   Protein-calorie malnutrition, severe 09/21/2019   Rheumatoid arthritis ([place])    S/P TAVR (transcatheter aortic valve replacement) 08/08/2019   Sequelae of open wound of right lower extremity 03/15/2019   Severe aortic stenosis 07/19/2019   Thoracic ascending aortic aneurysm (HCC)    4.4 cm by 07/26/19 CTA   Traumatic arthritis of left hip 10/02/2019   Ulcer of lower extremity ([place])    Wears glasses    Past Surgical History:  Procedure Laterality Date   ABDOMINAL AORTOGRAM W/LOWER EXTREMITY N/A 05/08/2019   Procedure: ABDOMINAL AORTOGRAM W/LOWER EXTREMITY;  Surgeon: Serafina Mitchell, MD;  Location: [facility];  Service: Cardiovascular;  Laterality: N/A;   CATARACT EXTRACTION W/PHACO Left 10/24/2020   Procedure: CATARACT EXTRACTION PHACO AND INTRAOCULAR LENS PLACEMENT LEFT EYE;  Surgeon: Baruch Goldmann, MD;  Location: [facility];  Service: Ophthalmology;  Laterality: Left;  CDE   20.05   CATARACT EXTRACTION W/PHACO Right 11/07/2020   Procedure: CATARACT EXTRACTION PHACO AND INTRAOCULAR LENS PLACEMENT RIGHT EYE;  Surgeon: Baruch Goldmann, MD;  Location: [facility];  Service: Ophthalmology;  Laterality: Right;  right, CDE=24.26   HARDWARE REMOVAL Left 10/02/2019   Procedure: HARDWARE REMOVAL;  Surgeon: Mcarthur Rossetti, MD;  Location: [facility];  Service: Orthopedics;  Laterality: Left;   HIP PINNING,CANNULATED Left 03/09/2019   Procedure: CANNULATED HIP PINNING;  Surgeon: Mcarthur Rossetti, MD;  Location: [facility];  Service: Orthopedics;  Laterality: Left;   INTRAMEDULLARY (IM) NAIL INTERTROCHANTERIC Right 03/14/2015   Procedure: RIGHT INTERTROCHANTRIC INTRAMEDULLARY (IM) NAIL ;  Surgeon: Mcarthur Rossetti, MD;  Location: [facility];  Service: Orthopedics;  Laterality: Right;   PERIPHERAL VASCULAR INTERVENTION Right 05/08/2019   Procedure: PERIPHERAL VASCULAR INTERVENTION;  Surgeon: Serafina Mitchell, MD;  Location: [facility];  Service: Cardiovascular;  Laterality: Right;  RIGHT/LEFT HEART CATH AND CORONARY ANGIOGRAPHY N/A 07/19/2019   Procedure: RIGHT/LEFT HEART CATH AND CORONARY ANGIOGRAPHY;  Surgeon: Sherren Mocha, MD;  Location: [facility];  Service: Cardiovascular;  Laterality: N/A;   TEE WITHOUT CARDIOVERSION N/A 08/07/2019   Procedure: TRANSESOPHAGEAL ECHOCARDIOGRAM (TEE);  Surgeon: Sherren Mocha, MD;  Location: [facility];  Service: Open Heart Surgery;  Laterality: N/A;   TOTAL HIP ARTHROPLASTY Left 10/02/2019   Procedure: LEFT TOTAL HIP ARTHROPLASTY ANTERIOR APPROACH AND REMOVAL OF CANNULATED SCREWS OF LEFT HIP;  Surgeon: Mcarthur Rossetti, MD;  Location: [facility];  Service: Orthopedics;  Laterality: Left;   TRANSCATHETER AORTIC VALVE REPLACEMENT, TRANSFEMORAL  08/07/2019   TRANSCATHETER AORTIC VALVE REPLACEMENT, TRANSFEMORAL N/A 08/07/2019   Procedure: TRANSCATHETER AORTIC VALVE REPLACEMENT, TRANSFEMORAL;  Surgeon: Sherren Mocha, MD;   Location: [facility];  Service: Open Heart Surgery;  Laterality: N/A;   Family History  Problem Relation Age of Onset   Cancer Sister    Social History   Socioeconomic History   Marital status: Widowed    Spouse name: Not on file   Number of children: Not on file   Years of education: Not on file   Highest education level: Not on file  Occupational History   Occupation: retired  Tobacco Use   Smoking status: Never   Smokeless tobacco: Never  Vaping Use   Vaping Use: Never used  Substance and Sexual Activity   Alcohol use: No   Drug use: No   Sexual activity: Not on file  Other Topics Concern   Not on file  Social History Narrative   Not on file   Social Determinants of Health   Financial Resource Strain: Low Risk  (07/27/2022)   Overall Financial Resource Strain (CARDIA)    Difficulty of Paying Living Expenses: Not hard at all  Food Insecurity: No Food Insecurity (07/27/2022)   Hunger Vital Sign    Worried About Running Out of Food in the Last Year: Never true    [place] in the Last Year: Never true  Transportation Needs: No Transportation Needs (07/27/2022)   PRAPARE - Hydrologist (Medical): No    Lack of Transportation (Non-Medical): No  Physical Activity: Insufficiently Active (07/27/2022)   Exercise Vital Sign    Days of Exercise per Week: 2 days    Minutes of Exercise per Session: 20 min  Stress: No Stress Concern Present (07/27/2022)   [place]    Feeling of Stress : Not at all  Social Connections: Moderately Integrated (07/27/2022)   Social Connection and Isolation Panel [NHANES]    Frequency of Communication with Friends and Family: More than three times a week    Frequency of Social Gatherings with Friends and Family: More than three times a week    Attends Religious Services: More than 4 times per year    Active Member of [employer] or Organizations: Yes     Attends Archivist Meetings: More than 4 times per year    Marital Status: Widowed    Tobacco Counseling Counseling given: Not Answered   Clinical Intake:  Pre-visit preparation completed: Yes  Pain : No/denies pain     BMI - recorded: 20.8 Nutritional Status: BMI of 19-24  Normal Nutritional Risks: None Diabetes: No  How often do you need to have someone help you when you read instructions, pamphlets, or other written materials from your doctor or pharmacy?:  3 - Sometimes (Daughter assist)  Diabetic?  No  Interpreter Needed?: No  Information entered by :: Rolene Arbour LPN   Activities of Daily Living    07/27/2022    1:49 PM 07/26/2022    8:31 PM  In your present state of health, do you have any difficulty performing the following activities:  Hearing? 0 0  Vision? 0 0  Difficulty concentrating or making decisions? 0 0  Walking or climbing stairs? 0 0  Dressing or bathing? 0 0  Doing errands, shopping? 0 0  Preparing Food and eating ? N N  Using the Toilet? N N  In the past six months, have you accidently leaked urine? Tempie Donning  Comment Wears pads. Followed by PCP   Do you have problems with loss of bowel control? N N  Managing your Medications? N N  Managing your Finances? N N  Housekeeping or managing your Housekeeping? N Y    Patient Care Team: [place], Blondell Reveal, MD as PCP - General (Family Medicine) Sherren Mocha, MD as PCP - Cardiology (Cardiology)  Indicate any recent Medical Services you may have received from other than [facility] providers in the past year (date may be approximate).     Assessment:   This is a routine wellness examination for [facility].  Hearing/Vision screen Hearing Screening - Comments:: Denies hearing difficulties   Vision Screening - Comments:: Wears rx glasses - up to date with routine eye exams with  Dr  Jorja Loa  Dietary issues and exercise activities discussed: Exercise limited by: None identified   Goals Addressed                This Visit's Progress     No current goals (pt-stated)         Depression Screen    07/27/2022    1:48 PM 12/17/2020    8:06 AM  PHQ 2/9 Scores  PHQ - 2 Score 0 0    Fall Risk    07/27/2022    1:51 PM 07/26/2022    8:31 PM  [place] in the past year? 1 1  Number falls in past yr: 1 1  Injury with Fall? 1 1  Comment Fx left arm. Followed by medical attention.   Risk for fall due to : Impaired balance/gait   Follow up Falls prevention discussed     FALL RISK PREVENTION PERTAINING TO THE HOME:  Any stairs in or around the home? No  If so, are there any without handrails? No  Home free of loose throw rugs in walkways, pet beds, electrical cords, etc? Yes  Adequate lighting in your home to reduce risk of falls? Yes   ASSISTIVE DEVICES UTILIZED TO PREVENT FALLS:  Life alert? Yes  Use of a cane, walker or w/c? Yes  Grab bars in the bathroom? No  Shower chair or bench in shower? No  Elevated toilet seat or a handicapped toilet? Yes   TIMED UP AND GO:  Was the test performed? No . Audio Visit  Cognitive Function:        07/27/2022    1:54 PM  6CIT Screen  What Year? 0 points  What month? 0 points  What time? 0 points  Count back from 20 0 points  Months in reverse 0 points  Repeat phrase 0 points  Total Score 0 points    Immunizations Immunization History  Administered Date(s) Administered   Fluad Quad(high Dose 65+) 03/12/2019   Influenza-Unspecified 06/21/2018  Tdap 03/28/2019    TDAP status: Up to date  Flu Vaccine status: Due, Education has been provided regarding the importance of this vaccine. Advised may receive this vaccine at local pharmacy or Health Dept. Aware to provide a copy of the vaccination record if obtained from local pharmacy or Health Dept. Verbalized acceptance and understanding.  Pneumococcal vaccine status: Due, Education has been provided regarding the importance of this vaccine. Advised may receive this  vaccine at local pharmacy or Health Dept. Aware to provide a copy of the vaccination record if obtained from local pharmacy or Health Dept. Verbalized acceptance and understanding.  Covid-19 vaccine status: Declined, Education has been provided regarding the importance of this vaccine but patient still declined. Advised may receive this vaccine at local pharmacy or Health Dept.or vaccine clinic. Aware to provide a copy of the vaccination record if obtained from local pharmacy or Health Dept. Verbalized acceptance and understanding.  Qualifies for Shingles Vaccine? Yes   Zostavax completed No   Shingrix Completed?: No.    Education has been provided regarding the importance of this vaccine. Patient has been advised to call insurance company to determine out of pocket expense if they have not yet received this vaccine. Advised may also receive vaccine at local pharmacy or Health Dept. Verbalized acceptance and understanding.  Screening Tests Health Maintenance  Topic Date Due   COVID-19 Vaccine (1) 08/12/2022 (Originally 05/03/1932)   INFLUENZA VACCINE  09/19/2022 (Originally 01/19/2022)   Zoster Vaccines- Shingrix (1 of 2) 10/25/2022 (Originally 11/01/1983)   Pneumonia Vaccine 36+ Years old (1 - PCV) 07/28/2023 (Originally 11/01/1998)   Medicare Annual Wellness (AWV)  07/28/2023   DTaP/Tdap/Td (2 - Td or Tdap) 03/27/2029   DEXA SCAN  Completed   HPV VACCINES  Aged Out    Health Maintenance  There are no preventive care reminders to display for this patient.   Colorectal cancer screening: No longer required.   Mammogram status: No longer required due to Age.  Bone Density status: Completed 01/23/10. Results reflect: Bone density results: OSTEOPOROSIS. Repeat every   years.  Lung Cancer Screening: (Low Dose CT Chest recommended if Age 52-80 years, 30 pack-year currently smoking OR have quit w/in 15years.) does not qualify.    Additional Screening:  Hepatitis C Screening: does not qualify;  Completed   Vision Screening: Recommended annual ophthalmology exams for early detection of glaucoma and other disorders of the eye. Is the patient up to date with their annual eye exam?  Yes  Who is the provider or what is the name of the office in which the patient attends annual eye exams? Dr Jorja Loa If pt is not established with a provider, would they like to be referred to a provider to establish care? No .   Dental Screening: Recommended annual dental exams for proper oral hygiene  Community Resource Referral / Chronic Care Management:  CRR required this visit?  No   CCM required this visit?  No      Plan:     I have personally reviewed and noted the following in the patient's chart:   Medical and social history Use of alcohol, tobacco or illicit drugs  Current medications and supplements including opioid prescriptions. Patient is currently taking opioid prescriptions. Information provided to patient regarding non-opioid alternatives. Patient advised to discuss non-opioid treatment plan with their provider. Functional ability and status Nutritional status Physical activity Advanced directives List of other physicians Hospitalizations, surgeries, and ER visits in previous 12 months Vitals Screenings to include cognitive,  depression, and falls Referrals and appointments  In addition, I have reviewed and discussed with patient certain preventive protocols, quality metrics, and best practice recommendations. A written personalized care plan for preventive services as well as general preventive health recommendations were provided to patient.     Michaela Peaches, LPN   [id number]   Nurse Notes: None

## 2022-08-26 ENCOUNTER — Encounter: Payer: Self-pay | Admitting: Radiology

## 2022-09-09 ENCOUNTER — Other Ambulatory Visit (HOSPITAL_BASED_OUTPATIENT_CLINIC_OR_DEPARTMENT_OTHER): Payer: Self-pay

## 2022-09-09 DIAGNOSIS — Z0000 Encounter for general adult medical examination without abnormal findings: Secondary | ICD-10-CM

## 2022-09-09 MED ORDER — MAGNESIUM OXIDE -MG SUPPLEMENT 400 (240 MG) MG PO TABS: ORAL_TABLET | ORAL | 1 refills | Status: DC

## 2022-09-30 ENCOUNTER — Other Ambulatory Visit (HOSPITAL_BASED_OUTPATIENT_CLINIC_OR_DEPARTMENT_OTHER): Payer: Self-pay

## 2022-09-30 MED ORDER — AMLODIPINE BESYLATE 5 MG PO TABS: ORAL_TABLET | ORAL | 1 refills | Status: DC

## 2022-11-08 ENCOUNTER — Encounter: Payer: Self-pay | Admitting: Physician Assistant

## 2022-11-08 ENCOUNTER — Ambulatory Visit (INDEPENDENT_AMBULATORY_CARE_PROVIDER_SITE_OTHER): Payer: Medicare Other | Admitting: Physician Assistant

## 2022-11-08 ENCOUNTER — Other Ambulatory Visit (INDEPENDENT_AMBULATORY_CARE_PROVIDER_SITE_OTHER): Payer: Medicare Other

## 2022-11-08 DIAGNOSIS — M25552 Pain in left hip: Secondary | ICD-10-CM

## 2022-11-08 MED ORDER — TRAMADOL HCL 50 MG PO TABS: 50.0000 mg | ORAL_TABLET | Freq: Four times a day (QID) | ORAL | 0 refills | Status: DC | PRN

## 2022-11-09 ENCOUNTER — Encounter: Payer: Self-pay | Admitting: Physician Assistant

## 2022-11-09 NOTE — Progress Notes (Signed)
HPI: Michaela Simpson comes in for left hip pain.  She tripped into a door last Saturday no loss of consciousness no dizziness.  Mechanical fall.  She has been using a cane since then and today had to go over to a walker due to the left hip and groin pain.  She ranks her pain to be 9 out of 10 at weightbearing she states she is comfortable when sitting.  She has been taking Tylenol for the pain.  Review of systems: See HPI  Physical exam: General well-developed well-nourished female in no acute distress seated. Left hip fluid motion but pain with range of motion.  Maximal tenderness is in the area of the left pubic ramus region.  Left calf supple nontender.  Good range of motion left knee.  Radiographs: Left hip and AP pelvis: Patient's status post IM nailing right hip fracture with no hardware failure.  Status post left total hip arthroplasty well-seated components.  No fracture of either hip.  Nondisplaced left ramus fracture appears acute.  No other acute findings.   Impression: Left ramus fractures  Plan: She is weightbearing as tolerated with a walker.  Will see her back in just 2 weeks for repeat radiographs.  She is given tramadol for pain.  Questions were encouraged and answered at length today.

## 2022-11-25 ENCOUNTER — Ambulatory Visit: Payer: Medicare Other | Admitting: Physician Assistant

## 2022-11-29 ENCOUNTER — Other Ambulatory Visit (INDEPENDENT_AMBULATORY_CARE_PROVIDER_SITE_OTHER): Payer: Medicare Other

## 2022-11-29 ENCOUNTER — Encounter: Payer: Self-pay | Admitting: Orthopaedic Surgery

## 2022-11-29 ENCOUNTER — Ambulatory Visit (INDEPENDENT_AMBULATORY_CARE_PROVIDER_SITE_OTHER): Payer: Medicare Other | Admitting: Orthopaedic Surgery

## 2022-11-29 DIAGNOSIS — M25552 Pain in left hip: Secondary | ICD-10-CM

## 2022-11-29 NOTE — Progress Notes (Signed)
The patient is an 89 year old female who comes in for follow-up after having previous left-sided nondisplaced superior and inferior rami fractures.  The few weeks ago she needed to walk with a walker and she did not tolerate [place] putting her hip through internal and external rotation.  Now she is ambulate with a cane and says she just a little sore but doing well overall.  On my exam today she easily let me put her left hip through internal and external rotation and compression with no pain at all.  X-rays did not show any displacement of the left-sided inferior and superior rami fractures and the hardware itself on both hips with a hip replacement on the left and a rod and screw on the right are intact.  Since she is basically and was asymptomatic, follow-up can be as needed and she will increase her activities as comfort allows.  She is just taken Tylenol as needed for pain.  All questions and concerns were answered and addressed.

## 2023-01-13 ENCOUNTER — Ambulatory Visit (HOSPITAL_COMMUNITY): Payer: Medicare Other | Attending: Cardiovascular Disease

## 2023-01-13 DIAGNOSIS — E782 Mixed hyperlipidemia: Secondary | ICD-10-CM | POA: Insufficient documentation

## 2023-01-13 DIAGNOSIS — Z952 Presence of prosthetic heart valve: Secondary | ICD-10-CM | POA: Insufficient documentation

## 2023-01-13 DIAGNOSIS — I2510 Atherosclerotic heart disease of native coronary artery without angina pectoris: Secondary | ICD-10-CM | POA: Diagnosis not present

## 2023-01-13 LAB — ECHOCARDIOGRAM COMPLETE
AR max vel: 1.17 cm2
AV Area VTI: 1.15 cm2
AV Area mean vel: 1.22 cm2
AV Mean grad: 7 mmHg
AV Peak grad: 12 mmHg
Ao pk vel: 1.73 m/s
Area-P 1/2: 4.86 cm2
S' Lateral: 2.1 cm

## 2023-02-08 ENCOUNTER — Other Ambulatory Visit: Payer: Self-pay | Admitting: Vascular Surgery

## 2023-02-08 ENCOUNTER — Other Ambulatory Visit (HOSPITAL_BASED_OUTPATIENT_CLINIC_OR_DEPARTMENT_OTHER): Payer: Self-pay | Admitting: Family Medicine

## 2023-02-08 DIAGNOSIS — F320 Major depressive disorder, single episode, mild: Secondary | ICD-10-CM

## 2023-02-08 NOTE — Telephone Encounter (Signed)
Please advise on refill request

## 2023-02-22 ENCOUNTER — Other Ambulatory Visit: Payer: Self-pay | Admitting: Vascular Surgery

## 2023-03-08 ENCOUNTER — Other Ambulatory Visit (HOSPITAL_BASED_OUTPATIENT_CLINIC_OR_DEPARTMENT_OTHER): Payer: Self-pay | Admitting: Family Medicine

## 2023-03-08 ENCOUNTER — Encounter: Payer: Self-pay | Admitting: Cardiovascular Disease

## 2023-03-08 ENCOUNTER — Ambulatory Visit: Payer: Medicare Other | Attending: Cardiovascular Disease | Admitting: Cardiovascular Disease

## 2023-03-08 VITALS — BP 140/80 | HR 68 | Ht 61.0 in | Wt 105.4 lb

## 2023-03-08 DIAGNOSIS — Z952 Presence of prosthetic heart valve: Secondary | ICD-10-CM | POA: Diagnosis not present

## 2023-03-08 DIAGNOSIS — I10 Essential (primary) hypertension: Secondary | ICD-10-CM | POA: Insufficient documentation

## 2023-03-08 DIAGNOSIS — E785 Hyperlipidemia, unspecified: Secondary | ICD-10-CM

## 2023-03-08 NOTE — Patient Instructions (Signed)
Medication Instructions:  Your physician recommends that you continue on your current medications as directed. Please refer to the Current Medication list given to you today.  *If you need a refill on your cardiac medications before your next appointment, please call your pharmacy*   Lab Work: NONE If you have labs (blood work) drawn today and your tests are completely normal, you will receive your results only by: MyChart Message (if you have MyChart) OR A paper copy in the mail If you have any lab test that is abnormal or we need to change your treatment, we will call you to review the results.   Testing/Procedures: ECHO (prior to next year's visit) Your physician has requested that you have an echocardiogram. Echocardiography is a painless test that uses sound waves to create images of your heart. It provides your doctor with information about the size and shape of your heart and how well your heart's chambers and valves are working. This procedure takes approximately one hour. There are no restrictions for this procedure. Please do NOT wear cologne, perfume, aftershave, or lotions (deodorant is allowed). Please arrive 15 minutes prior to your appointment time.  Follow-Up: At [facility], you and your health needs are our priority.  As part of our continuing mission to provide you with exceptional heart care, we have created designated Provider Care Teams.  These Care Teams include your primary Cardiologist (physician) and Advanced Practice Providers (APPs -  Physician Assistants and Nurse Practitioners) who all work together to provide you with the care you need, when you need it.  Your next appointment:   1 year(s)  Provider:   Tonny Bollman, MD

## 2023-03-08 NOTE — Progress Notes (Signed)
**Note Michaela-Identified via Obfuscation** Cardiology Office Note:    Date:  03/08/2023   ID:  Michaela Simpson, Michaela Simpson Feb 27, 1932, MRN [id number]  PCP:  Michaela Peru, Raymond J, MD   [facility] Providers Cardiologist:  Michaela Bollman, MD     Referring MD: Michaela Peru, Buren Kos, MD   Chief Complaint  Patient presents with   S/P TAVR    History of Present Illness:    Michaela Simpson is a 89 y.o. female with a hx of severe aortic stenosis, hypertension, rheumatoid arthritis, and peripheral arterial disease. She underwent TAVR in 2021 without complication. The patient was treated with a 23 mm Edwards SAPIEN 3 valve and was noted to have normal bioprosthetic aortic valve function on postoperative echo imaging.   The patient is here with her daughter today.  She is doing well from a cardiovascular perspective and she denies any chest pain, heart palpitations, or leg edema.  When she is out working in her yard, she feels tired when she comes back again.  However, she denies exertional dyspnea or lightheadedness.  She had a lower extremity arterial intervention with stenting performed a few years back in order to treat a nonhealing ulceration.  She has been on DAPT ever since that time.  She has not had any recent follow-up with vascular.  Past Medical History:  Diagnosis Date   Acute blood loss anemia 03/22/2015   Acute renal failure syndrome (HCC)    AKI (acute kidney injury) (HCC)    Anemia    Aortic stenosis    Bilateral carpal tunnel syndrome 05/15/2018   Bruising    large bruise to left neck and T/O body   Chronic constipation 03/15/2019   Closed left humeral fracture 03/08/2019   Closed right hip fracture (HCC) 03/14/2015   Depression    Early cataract    Fracture of femoral neck, left, closed (HCC) 03/08/2019   Headache    Hip fracture (HCC)    HTN (hypertension)    Humerus fracture    Leukocytosis    Major depression, recurrent, chronic (HCC) 03/15/2019   Osteonecrosis (HCC)    post-traumatic osteonecrosis  of the femoral head of the left hip, retained orthopedic hardware ( order for conset)   Osteoporosis    Peripheral vascular disease (HCC)    blood clot right leg   Protein-calorie malnutrition, severe 09/21/2019   Rheumatoid arthritis (HCC)    S/P TAVR (transcatheter aortic valve replacement) 08/08/2019   Sequelae of open wound of right lower extremity 03/15/2019   Severe aortic stenosis 07/19/2019   Thoracic ascending aortic aneurysm (HCC)    4.4 cm by 07/26/19 CTA   Traumatic arthritis of left hip 10/02/2019   Ulcer of lower extremity (HCC)    Wears glasses     Past Surgical History:  Procedure Laterality Date   ABDOMINAL AORTOGRAM W/LOWER EXTREMITY N/A 05/08/2019   Procedure: ABDOMINAL AORTOGRAM W/LOWER EXTREMITY;  Surgeon: Nada Libman, MD;  Location: [facility];  Service: Cardiovascular;  Laterality: N/A;   CATARACT EXTRACTION W/PHACO Left 10/24/2020   Procedure: CATARACT EXTRACTION PHACO AND INTRAOCULAR LENS PLACEMENT LEFT EYE;  Surgeon: Fabio Pierce, MD;  Location: [facility];  Service: Ophthalmology;  Laterality: Left;  CDE  20.05   CATARACT EXTRACTION W/PHACO Right 11/07/2020   Procedure: CATARACT EXTRACTION PHACO AND INTRAOCULAR LENS PLACEMENT RIGHT EYE;  Surgeon: Fabio Pierce, MD;  Location: [facility];  Service: Ophthalmology;  Laterality: Right;  right, CDE=24.26   HARDWARE REMOVAL Left 10/02/2019   Procedure: HARDWARE REMOVAL;  Surgeon: Kathryne Hitch, MD;  Location: [facility] OR;  Service: Orthopedics;  Laterality: Left;   HIP PINNING,CANNULATED Left 03/09/2019   Procedure: CANNULATED HIP PINNING;  Surgeon: Kathryne Hitch, MD;  Location: [facility] OR;  Service: Orthopedics;  Laterality: Left;   INTRAMEDULLARY (IM) NAIL INTERTROCHANTERIC Right 03/14/2015   Procedure: RIGHT INTERTROCHANTRIC INTRAMEDULLARY (IM) NAIL ;  Surgeon: Kathryne Hitch, MD;  Location: [facility] OR;  Service: Orthopedics;  Laterality: Right;   PERIPHERAL VASCULAR INTERVENTION Right 05/08/2019    Procedure: PERIPHERAL VASCULAR INTERVENTION;  Surgeon: Nada Libman, MD;  Location: [facility];  Service: Cardiovascular;  Laterality: Right;   RIGHT/LEFT HEART CATH AND CORONARY ANGIOGRAPHY N/A 07/19/2019   Procedure: RIGHT/LEFT HEART CATH AND CORONARY ANGIOGRAPHY;  Surgeon: Michaela Bollman, MD;  Location: [facility] INVASIVE CV LAB;  Service: Cardiovascular;  Laterality: N/A;   TEE WITHOUT CARDIOVERSION N/A 08/07/2019   Procedure: TRANSESOPHAGEAL ECHOCARDIOGRAM (TEE);  Surgeon: Michaela Bollman, MD;  Location: [facility] INVASIVE CV LAB;  Service: Open Heart Surgery;  Laterality: N/A;   TOTAL HIP ARTHROPLASTY Left 10/02/2019   Procedure: LEFT TOTAL HIP ARTHROPLASTY ANTERIOR APPROACH AND REMOVAL OF CANNULATED SCREWS OF LEFT HIP;  Surgeon: Kathryne Hitch, MD;  Location: [facility] OR;  Service: Orthopedics;  Laterality: Left;   TRANSCATHETER AORTIC VALVE REPLACEMENT, TRANSFEMORAL  08/07/2019   TRANSCATHETER AORTIC VALVE REPLACEMENT, TRANSFEMORAL N/A 08/07/2019   Procedure: TRANSCATHETER AORTIC VALVE REPLACEMENT, TRANSFEMORAL;  Surgeon: Michaela Bollman, MD;  Location: [facility] INVASIVE CV LAB;  Service: Open Heart Surgery;  Laterality: N/A;    Current Medications: Current Meds  Medication Sig   acetaminophen (TYLENOL) 325 MG tablet Take 2 tablets (650 mg total) by mouth every 6 (six) hours as needed for mild pain (or Fever >/= 101).   amLODipine (NORVASC) 5 MG tablet Dorena Bodo, FNP   aspirin EC 81 MG tablet Take 81 mg by mouth daily.   azithromycin (ZITHROMAX) 500 MG tablet Take 1 tablet (500 mg) one hour prior to all dental visits.   Boswellia-Glucosamine-Vit D (OSTEO BI-FLEX ONE PER DAY PO) Take 1 tablet by mouth daily.   Cholecalciferol (VITAMIN D-3) 125 MCG (5000 UT) TABS Take 1 tablet by mouth daily.   clopidogrel (PLAVIX) 75 MG tablet TAKE 1 TABLET(75 MG) BY MOUTH DAILY   Lactobacillus (PROBIOTIC ACIDOPHILUS PO) Take 1 tablet by mouth daily.   magnesium oxide (MAG-OX) 400 (240 Mg) MG tablet TAKE 1  TABLET(400 MG) BY MOUTH DAILY   Menatetrenone (VITAMIN K2) 100 MCG TABS Take 100 mcg by mouth daily.   rosuvastatin (CRESTOR) 10 MG tablet Take 1 tablet (10 mg total) by mouth daily.   sertraline (ZOLOFT) 100 MG tablet Take 1.5 tablets (150 mg total) by mouth daily.   traMADol (ULTRAM) 50 MG tablet Take 1 tablet (50 mg total) by mouth every 6 (six) hours as needed.   vitamin B-12 (CYANOCOBALAMIN) 1000 MCG tablet Take 1,000 mcg by mouth daily.     Allergies:   Penicillins and Codeine   Social History   Socioeconomic History   Marital status: Widowed    Spouse name: Not on file   Number of children: Not on file   Years of education: Not on file   Highest education level: Not on file  Occupational History   Occupation: retired  Tobacco Use   Smoking status: Never   Smokeless tobacco: Never  Vaping Use   Vaping status: Never Used  Substance and Sexual Activity   Alcohol use: No   Drug use: No   Sexual  activity: Not on file  Other Topics Concern   Not on file  Social History Narrative   Not on file   Social Determinants of Health   Financial Resource Strain: Low Risk  (07/27/2022)   Overall Financial Resource Strain (CARDIA)    Difficulty of Paying Living Expenses: Not hard at all  Food Insecurity: No Food Insecurity (07/27/2022)   Hunger Vital Sign    Worried About Running Out of Food in the Last Year: Never true    Ran Out of Food in the Last Year: Never true  Transportation Needs: No Transportation Needs (07/27/2022)   PRAPARE - Administrator, Civil Service (Medical): No    Lack of Transportation (Non-Medical): No  Physical Activity: Insufficiently Active (07/27/2022)   Exercise Vital Sign    Days of Exercise per Week: 2 days    Minutes of Exercise per Session: 20 min  Stress: No Stress Concern Present (07/27/2022)   [employer] of Occupational Health - Occupational Stress Questionnaire    Feeling of Stress : Not at all  Social Connections: Moderately  Integrated (07/27/2022)   Social Connection and Isolation Panel [NHANES]    Frequency of Communication with Friends and Family: More than three times a week    Frequency of Social Gatherings with Friends and Family: More than three times a week    Attends Religious Services: More than 4 times per year    Active Member of [employer] or Organizations: Yes    Attends Banker Meetings: More than 4 times per year    Marital Status: Widowed     Family History: The patient's family history includes Cancer in her sister.  ROS:   Please see the history of present illness.    All other systems reviewed and are negative.  EKGs/Labs/Other Studies Reviewed:    The following studies were reviewed today: EKG Interpretation Date/Time:  Tuesday March 08 2023 08:54:03 EDT Ventricular Rate:  67 PR Interval:  158 QRS Duration:  94 QT Interval:  438 QTC Calculation: 462 R Axis:   -45  Text Interpretation: Normal sinus rhythm Incomplete right bundle branch block Left anterior fascicular block When compared with ECG of 19-Sep-2019 12:21, PREVIOUS ECG IS PRESENT No significant change was found Confirmed by Michaela Simpson [phone]) on 03/08/2023 8:55:39 AM    Recent Labs: No results found for requested labs within last 365 days.  Recent Lipid Panel No results found for: "CHOL", "TRIG", "HDL", "CHOLHDL", "VLDL", "LDLCALC", "LDLDIRECT"  Echo: 1. Left ventricular ejection fraction, by estimation, is 55 to 60%. The  left ventricle has normal function. The left ventricle has no regional  wall motion abnormalities. Left ventricular diastolic parameters are  consistent with Grade I diastolic  dysfunction (impaired relaxation).   2. Right ventricular systolic function is normal. The right ventricular  size is normal.   3. A small pericardial effusion is present. There is no evidence of  cardiac tamponade.   4. The mitral valve is normal in structure. No evidence of mitral valve  regurgitation.  No evidence of mitral stenosis.   5. The aortic valve has been repaired/replaced. Aortic valve  regurgitation is not visualized. No aortic stenosis is present. There is a  23 mm Sapien prosthetic (TAVR) valve present in the aortic position.  Procedure Date: 08/08/2019. Echo findings are  consistent with normal structure and function of the aortic valve  prosthesis. Aortic valve area, by VTI measures 1.15 cm. Aortic valve mean  gradient measures 7.0 mmHg. Aortic  valve Vmax measures 1.73 m/s.   6. Aortic dilatation noted. There is mild dilatation of the ascending  aorta, measuring 42 mm.   7. The inferior vena cava is normal in size with greater than 50%  respiratory variability, suggesting right atrial pressure of 3 mmHg.   Risk Assessment/Calculations:           Physical Exam:    VS:  BP (!) 140/80   Pulse 68   Ht 5\' 1"  (1.549 m)   Wt 105 lb 6.4 oz (47.8 kg)   SpO2 100%   BMI 19.92 kg/m     Wt Readings from Last 3 Encounters:  03/08/23 105 lb 6.4 oz (47.8 kg)  07/27/22 112 lb (50.8 kg)  03/09/22 109 lb 3.2 oz (49.5 kg)     GEN:  Well nourished, well developed pleasant elderly woman in no acute distress HEENT: Normal NECK: No JVD; No carotid bruits LYMPHATICS: No lymphadenopathy CARDIAC: RRR, 1/6 ejection murmur at the right upper sternal border RESPIRATORY:  Clear to auscultation without rales, wheezing or rhonchi  ABDOMEN: Soft, non-tender, non-distended MUSCULOSKELETAL:  No edema; No deformity  SKIN: Warm and dry NEUROLOGIC:  Alert and oriented x 3 PSYCHIATRIC:  Normal affect   ASSESSMENT:    1. Essential hypertension, benign   2. S/P TAVR (transcatheter aortic valve replacement)    PLAN:    In order of problems listed above:  Blood pressure control appropriate at age 46.  Continue amlodipine at current dose. The patient appears to be doing well with New York Heart Association functional class I symptoms.  Recent echo reviewed and shows normal function of  her transcatheter heart valve.  LVEF is normal.  I reviewed her medication program today.  I think it her advanced age with consideration of future bleeding risk, we should reduce her antiplatelet therapy to monotherapy with clopidogrel.  She will discontinue low-dose aspirin.  We also discussed the need for SBE prophylaxis and she follows this with Zithromax in the setting of penicillin allergy.  I will see her back in 1 year with a follow-up echocardiogram.           Medication Adjustments/Labs and Tests Ordered: Current medicines are reviewed at length with the patient today.  Concerns regarding medicines are outlined above.  Orders Placed This Encounter  Procedures   EKG 12-Lead   EKG 12-Lead   ECHOCARDIOGRAM COMPLETE   No orders of the defined types were placed in this encounter.   Patient Instructions  Medication Instructions:  Your physician recommends that you continue on your current medications as directed. Please refer to the Current Medication list given to you today.  *If you need a refill on your cardiac medications before your next appointment, please call your pharmacy*   Lab Work: NONE If you have labs (blood work) drawn today and your tests are completely normal, you will receive your results only by: MyChart Message (if you have MyChart) OR A paper copy in the mail If you have any lab test that is abnormal or we need to change your treatment, we will call you to review the results.  Testing/Procedures: ECHO (prior to next year's visit) Your physician has requested that you have an echocardiogram. Echocardiography is a painless test that uses sound waves to create images of your heart. It provides your doctor with information about the size and shape of your heart and how well your heart's chambers and valves are working. This procedure takes approximately one hour. There are no  restrictions for this procedure. Please do NOT wear cologne, perfume, aftershave, or  lotions (deodorant is allowed). Please arrive 15 minutes prior to your appointment time.  Follow-Up: At [facility], you and your health needs are our priority.  As part of our continuing mission to provide you with exceptional heart care, we have created designated Provider Care Teams.  These Care Teams include your primary Cardiologist (physician) and Advanced Practice Providers (APPs -  Physician Assistants and Nurse Practitioners) who all work together to provide you with the care you need, when you need it.  Your next appointment:   1 year(s)  Provider:   Tonny Bollman, MD        Signed, Michaela Bollman, MD  03/08/2023 1:14 PM    [facility]

## 2023-03-09 ENCOUNTER — Encounter (HOSPITAL_BASED_OUTPATIENT_CLINIC_OR_DEPARTMENT_OTHER): Payer: Self-pay | Admitting: *Deleted

## 2023-03-09 NOTE — Telephone Encounter (Signed)
LVM for pt to schedule an appt sent message via mychart as well

## 2023-03-15 ENCOUNTER — Telehealth (HOSPITAL_BASED_OUTPATIENT_CLINIC_OR_DEPARTMENT_OTHER): Payer: Self-pay | Admitting: *Deleted

## 2023-03-15 DIAGNOSIS — F320 Major depressive disorder, single episode, mild: Secondary | ICD-10-CM

## 2023-03-15 NOTE — Telephone Encounter (Signed)
Pt requesting refill on sertraline 100 mg to [facility] [address]

## 2023-03-18 ENCOUNTER — Other Ambulatory Visit: Payer: Self-pay | Admitting: *Deleted

## 2023-03-18 DIAGNOSIS — I739 Peripheral vascular disease, unspecified: Secondary | ICD-10-CM

## 2023-03-18 MED ORDER — SERTRALINE HCL 100 MG PO TABS: 150.0000 mg | ORAL_TABLET | Freq: Every day | ORAL | 1 refills | Status: DC

## 2023-03-18 NOTE — Addendum Note (Signed)
Addended by: DE Peru, Tasneem Cormier J on: 03/18/2023 08:38 AM   Modules accepted: Orders

## 2023-03-18 NOTE — Telephone Encounter (Signed)
Called pt to schedule appt pt stated she needed to get thoughts together and call back. Pt advised that it was refilled but would need appt before could be refilled again

## 2023-03-18 NOTE — Telephone Encounter (Signed)
LVM to schedule pt appt

## 2023-03-27 ENCOUNTER — Encounter (HOSPITAL_BASED_OUTPATIENT_CLINIC_OR_DEPARTMENT_OTHER): Payer: Self-pay | Admitting: Family Medicine

## 2023-03-28 ENCOUNTER — Ambulatory Visit (INDEPENDENT_AMBULATORY_CARE_PROVIDER_SITE_OTHER): Payer: Medicare Other | Admitting: Physician Assistant

## 2023-03-28 ENCOUNTER — Ambulatory Visit (INDEPENDENT_AMBULATORY_CARE_PROVIDER_SITE_OTHER)
Admission: RE | Admit: 2023-03-28 | Discharge: 2023-03-28 | Disposition: A | Discharge status: Home / Self Care | Payer: Medicare Other | Source: Ambulatory Visit | Attending: Surgery | Admitting: Surgery

## 2023-03-28 ENCOUNTER — Ambulatory Visit (HOSPITAL_COMMUNITY)
Admission: RE | Admit: 2023-03-28 | Discharge: 2023-03-28 | Disposition: A | Discharge status: Home / Self Care | Payer: Medicare Other | Source: Ambulatory Visit | Attending: Surgery | Admitting: Surgery

## 2023-03-28 VITALS — BP 153/70 | HR 65 | Temp 98.0°F | Resp 18 | Ht 61.0 in | Wt 107.9 lb

## 2023-03-28 DIAGNOSIS — I739 Peripheral vascular disease, unspecified: Secondary | ICD-10-CM

## 2023-03-28 LAB — VAS US ABI WITH/WO TBI
Left ABI: 1.01
Right ABI: 1.15

## 2023-03-28 NOTE — Progress Notes (Signed)
[facility] [place] HISTORY AND PHYSICAL   History of Present Illness:  Patient is a 89 y.o. year old female who presents for evaluation of  PAD with history of right lower extremity pre-tibial ulceration.  This was nearly healed on her last visit 1 year ago.  She is status post abdominal aortogram, bilateral lower extremity runoff. Stent of right superficial femoral- popliteal artery. Mechanical Thrombectomy of the right superficial femoral -popliteal artery using the penumbra CAT 6 device. Mynx closure device of left femoral artery 05/08/2019.     Her right anterior shin wound has been fully healed for 2 years now.  She denies claudication, rest pain or non healing wounds. She was recently seen by Dr. Excell [facility] and she is now on Plavix and can stop the ASA.  He follows her for TAVR that was performed 2021.         Past Medical History:  Diagnosis Date   Acute blood loss anemia 03/22/2015   Acute renal failure syndrome (HCC)    AKI (acute kidney injury) (HCC)    Anemia    Aortic stenosis    Bilateral carpal tunnel syndrome 05/15/2018   Bruising    large bruise to left neck and T/O body   Chronic constipation 03/15/2019   Closed left humeral fracture 03/08/2019   Closed right hip fracture (HCC) 03/14/2015   Depression    Early cataract    Fracture of femoral neck, left, closed (HCC) 03/08/2019   Headache    Hip fracture (HCC)    HTN (hypertension)    Humerus fracture    Leukocytosis    Major depression, recurrent, chronic (HCC) 03/15/2019   Osteonecrosis (HCC)    post-traumatic osteonecrosis of the femoral head of the left hip, retained orthopedic hardware ( order for conset)   Osteoporosis    Peripheral vascular disease (HCC)    blood clot right leg   Protein-calorie malnutrition, severe 09/21/2019   Rheumatoid arthritis (HCC)    S/P TAVR (transcatheter aortic valve replacement) 08/08/2019   Sequelae of open wound of right lower extremity 03/15/2019    Severe aortic stenosis 07/19/2019   Thoracic ascending aortic aneurysm (HCC)    4.4 cm by 07/26/19 CTA   Traumatic arthritis of left hip 10/02/2019   Ulcer of lower extremity (HCC)    Wears glasses     Past Surgical History:  Procedure Laterality Date   ABDOMINAL AORTOGRAM W/LOWER EXTREMITY N/A 05/08/2019   Procedure: ABDOMINAL AORTOGRAM W/LOWER EXTREMITY;  Surgeon: Nada Libman, MD;  Location: [facility];  Service: Cardiovascular;  Laterality: N/A;   CATARACT EXTRACTION W/PHACO Left 10/24/2020   Procedure: CATARACT EXTRACTION PHACO AND INTRAOCULAR LENS PLACEMENT LEFT EYE;  Surgeon: Fabio Pierce, MD;  Location: [facility];  Service: Ophthalmology;  Laterality: Left;  CDE  20.05   CATARACT EXTRACTION W/PHACO Right 11/07/2020   Procedure: CATARACT EXTRACTION PHACO AND INTRAOCULAR LENS PLACEMENT RIGHT EYE;  Surgeon: Fabio Pierce, MD;  Location: [facility];  Service: Ophthalmology;  Laterality: Right;  right, CDE=24.26   HARDWARE REMOVAL Left 10/02/2019   Procedure: HARDWARE REMOVAL;  Surgeon: Kathryne Hitch, MD;  Location: [facility] OR;  Service: Orthopedics;  Laterality: Left;   HIP PINNING,CANNULATED Left 03/09/2019   Procedure: CANNULATED HIP PINNING;  Surgeon: Kathryne Hitch, MD;  Location: [facility] OR;  Service: Orthopedics;  Laterality: Left;   INTRAMEDULLARY (IM) NAIL INTERTROCHANTERIC Right 03/14/2015   Procedure: RIGHT INTERTROCHANTRIC INTRAMEDULLARY (IM) NAIL ;  Surgeon: Kathryne Hitch, MD;  Location:  [facility] OR;  Service: Orthopedics;  Laterality: Right;   PERIPHERAL VASCULAR INTERVENTION Right 05/08/2019   Procedure: PERIPHERAL VASCULAR INTERVENTION;  Surgeon: Nada Libman, MD;  Location: [facility];  Service: Cardiovascular;  Laterality: Right;   RIGHT/LEFT HEART CATH AND CORONARY ANGIOGRAPHY N/A 07/19/2019   Procedure: RIGHT/LEFT HEART CATH AND CORONARY ANGIOGRAPHY;  Surgeon: Tonny Bollman, MD;  Location: [facility] INVASIVE CV LAB;  Service: Cardiovascular;   Laterality: N/A;   TEE WITHOUT CARDIOVERSION N/A 08/07/2019   Procedure: TRANSESOPHAGEAL ECHOCARDIOGRAM (TEE);  Surgeon: Tonny Bollman, MD;  Location: [facility] INVASIVE CV LAB;  Service: Open Heart Surgery;  Laterality: N/A;   TOTAL HIP ARTHROPLASTY Left 10/02/2019   Procedure: LEFT TOTAL HIP ARTHROPLASTY ANTERIOR APPROACH AND REMOVAL OF CANNULATED SCREWS OF LEFT HIP;  Surgeon: Kathryne Hitch, MD;  Location: [facility] OR;  Service: Orthopedics;  Laterality: Left;   TRANSCATHETER AORTIC VALVE REPLACEMENT, TRANSFEMORAL  08/07/2019   TRANSCATHETER AORTIC VALVE REPLACEMENT, TRANSFEMORAL N/A 08/07/2019   Procedure: TRANSCATHETER AORTIC VALVE REPLACEMENT, TRANSFEMORAL;  Surgeon: Tonny Bollman, MD;  Location: [facility] INVASIVE CV LAB;  Service: Open Heart Surgery;  Laterality: N/A;    ROS:   General:  No weight loss, Fever, chills  HEENT: No recent headaches, no nasal bleeding, no visual changes, no sore throat  Neurologic: No dizziness, blackouts, seizures. No recent symptoms of stroke or mini- stroke. No recent episodes of slurred speech, or temporary blindness.  Cardiac: No recent episodes of chest pain/pressure, no shortness of breath at rest.  No shortness of breath with exertion.  Denies history of atrial fibrillation or irregular heartbeat  Vascular: No history of rest pain in feet.  No history of claudication.  No history of non-healing ulcer, No history of DVT   Pulmonary: No home oxygen, no productive cough, no hemoptysis,  No asthma or wheezing  Musculoskeletal:  [ ]  Arthritis, [ ]  Low back pain,  [ ]  Joint pain  Hematologic:No history of hypercoagulable state.  No history of easy bleeding.  No history of anemia  Gastrointestinal: No hematochezia or melena,  No gastroesophageal reflux, no trouble swallowing  Urinary: [ ]  chronic Kidney disease, [ ]  on HD - [ ]  MWF or [ ]  TTHS, [ ]  Burning with urination, [ ]  Frequent urination, [ ]  Difficulty urinating;   Skin: No rashes  Psychological:  No history of anxiety,  No history of depression  Social History Social History   Tobacco Use   Smoking status: Never   Smokeless tobacco: Never  Vaping Use   Vaping status: Never Used  Substance Use Topics   Alcohol use: No   Drug use: No    Family History Family History  Problem Relation Age of Onset   Cancer Sister     Allergies  Allergies  Allergen Reactions   Penicillins Nausea And Vomiting and Other (See Comments)    Child hood allergy - Flu symptoms; constant vomiting  Did it involve swelling of the face/tongue/throat, SOB, or low BP? Unknown Did it involve sudden or severe rash/hives, skin peeling, or any reaction on the inside of your mouth or nose? Unknown Did you need to seek medical attention at a hospital or doctor's office? Unknown When did it last happen?     50+ years If all above answers are "NO", may proceed with cephalosporin use.    Codeine Nausea And Vomiting and Rash    INTOLERANCE >  VOMITING     Current Outpatient Medications  Medication Sig Dispense Refill   acetaminophen (TYLENOL)  325 MG tablet Take 2 tablets (650 mg total) by mouth every 6 (six) hours as needed for mild pain (or Fever >/= 101).     amLODipine (NORVASC) 5 MG tablet Dorena Bodo, FNP 90 tablet 1   aspirin EC 81 MG tablet Take 81 mg by mouth daily.     azithromycin (ZITHROMAX) 500 MG tablet Take 1 tablet (500 mg) one hour prior to all dental visits. 3 tablet 12   Boswellia-Glucosamine-Vit D (OSTEO BI-FLEX ONE PER DAY PO) Take 1 tablet by mouth daily.     Cholecalciferol (VITAMIN D-3) 125 MCG (5000 UT) TABS Take 1 tablet by mouth daily. 90 tablet 3   clopidogrel (PLAVIX) 75 MG tablet TAKE 1 TABLET(75 MG) BY MOUTH DAILY 90 tablet 0   Lactobacillus (PROBIOTIC ACIDOPHILUS PO) Take 1 tablet by mouth daily.     magnesium oxide (MAG-OX) 400 (240 Mg) MG tablet TAKE 1 TABLET(400 MG) BY MOUTH DAILY 90 tablet 1   Menatetrenone (VITAMIN K2) 100 MCG TABS Take 100 mcg by mouth daily. 90  tablet 3   rosuvastatin (CRESTOR) 10 MG tablet TAKE 1 TABLET(10 MG) BY MOUTH DAILY 90 tablet 1   sertraline (ZOLOFT) 100 MG tablet Take 1.5 tablets (150 mg total) by mouth daily. 45 tablet 1   traMADol (ULTRAM) 50 MG tablet Take 1 tablet (50 mg total) by mouth every 6 (six) hours as needed. (Patient not taking: Reported on 03/28/2023) 30 tablet 0   vitamin B-12 (CYANOCOBALAMIN) 1000 MCG tablet Take 1,000 mcg by mouth daily.     No current facility-administered medications for this visit.    Physical Examination  Vitals:   03/28/23 1425  BP: (!) 153/70  Pulse: 65  Resp: 18  Temp: 98 F (36.7 C)  TempSrc: Temporal  SpO2: 95%  Weight: 107 lb 14.4 oz (48.9 kg)  Height: 5\' 1"  (1.549 m)    Body mass index is 20.39 kg/m.  General:  Alert and oriented, no acute distress HEENT: Normal Neck: No bruit or JVD Pulmonary: Clear to auscultation bilaterally Cardiac: Regular Rate and Rhythm without murmur Abdomen: Soft, non-tender, non-distended, no mass, no scars Skin: No rash Extremity Pulses:  2+ radial, brachial, femoral, dorsalis pedis, posterior tibial pulses bilaterally Musculoskeletal: No deformity or edema  Neurologic: Upper and lower extremity motor 5/5 and symmetric  DATA:  +-----------+--------+-----+--------+--------+--------+  RIGHT     PSV cm/sRatioStenosisWaveformComments  +-----------+--------+-----+--------+--------+--------+  CFA Distal 201                  biphasic          +-----------+--------+-----+--------+--------+--------+  DFA       115                  biphasic          +-----------+--------+-----+--------+--------+--------+  TP Trunk   94                   biphasic          +-----------+--------+-----+--------+--------+--------+  ATA Distal 92                   biphasic          +-----------+--------+-----+--------+--------+--------+  PTA Distal 74                   biphasic           +-----------+--------+-----+--------+--------+--------+  PERO Distal47  biphasic          +-----------+--------+-----+--------+--------+--------+     Right Stent(s):  +---------------+--------+--------+--------+--------+  SFA           PSV cm/sStenosisWaveformComments  +---------------+--------+--------+--------+--------+  Prox to Stent  142             biphasic          +---------------+--------+--------+--------+--------+  Proximal Stent 123             biphasic          +---------------+--------+--------+--------+--------+  Mid Stent      73              biphasic          +---------------+--------+--------+--------+--------+  Distal Stent   49              biphasic          +---------------+--------+--------+--------+--------+  Distal to Stent90              biphasic          +---------------+--------+--------+--------+--------+      Summary:  Right: Widely patent supericial femoral to popliteal artery stent without  evidence for stenosis.   ABI Findings:  +---------+------------------+-----+---------+--------+  Right   Rt Pressure (mmHg)IndexWaveform Comment   +---------+------------------+-----+---------+--------+  Brachial 146                                       +---------+------------------+-----+---------+--------+  PTA     168               1.15 triphasic          +---------+------------------+-----+---------+--------+  DP      159               1.09 triphasic          +---------+------------------+-----+---------+--------+  Great Toe104               0.71                    +---------+------------------+-----+---------+--------+   +---------+------------------+-----+--------+-------+  Left    Lt Pressure (mmHg)IndexWaveformComment  +---------+------------------+-----+--------+-------+  Brachial 139                                      +---------+------------------+-----+--------+-------+  PTA     143               0.98 biphasic         +---------+------------------+-----+--------+-------+  DP      148               1.01 biphasic         +---------+------------------+-----+--------+-------+  Great Toe96                0.66                  +---------+------------------+-----+--------+-------+   +-------+-----------+-----------+------------+------------+  ABI/TBIToday's ABIToday's TBIPrevious ABIPrevious TBI  +-------+-----------+-----------+------------+------------+  Right 1.15       0.71       1.11        0.78          +-------+-----------+-----------+------------+------------+  Left  1.01       0.66       1.06        0.67          +-------+-----------+-----------+------------+------------+  Bilateral ABIs and TBIs appear essentially unchanged compared to prior  study on 10/05/2021.    Summary:  Right: Resting right ankle-brachial index is within normal range. The  right toe-brachial index is normal.   Left: Resting left ankle-brachial index is within normal range. The left  toe-brachial index is abnormal.     ASSESSMENT/PLAN:   PAD with history of right pre-tibial ulcer requiring intervascular intervention abdominal aortogram, bilateral lower extremity runoff. Stent of right superficial femoral- popliteal artery. Mechanical Thrombectomy of the right superficial femoral -popliteal artery using the penumbra CAT 6 device. Mynx closure device of left femoral artery 05/08/2019 by Dr. Myra Gianotti.   She denies claudication, rest and non healing wounds.  She has palpable pedal pulse.  Continue to stay as active as she tolerates, wear comfortable shoes and check her skin often.   F/U for f/u in 1 year with repeat ABI and Arterial duplex.  If she has concerns she will call our office.       Mosetta Pigeon PA-C [facility] [place] Office:  [phone]  Myra Gianotti MD in clinic

## 2023-04-09 ENCOUNTER — Emergency Department (HOSPITAL_COMMUNITY): Payer: Medicare Other

## 2023-04-09 ENCOUNTER — Other Ambulatory Visit: Payer: Self-pay

## 2023-04-09 ENCOUNTER — Emergency Department (HOSPITAL_COMMUNITY)
Admission: EM | Admit: 2023-04-09 | Discharge: 2023-04-09 | Disposition: A | Discharge status: Home / Self Care | Payer: Medicare Other | Attending: Emergency Medicine | Admitting: Emergency Medicine

## 2023-04-09 DIAGNOSIS — M79606 Pain in leg, unspecified: Secondary | ICD-10-CM | POA: Diagnosis not present

## 2023-04-09 DIAGNOSIS — S12191A Other nondisplaced fracture of second cervical vertebra, initial encounter for closed fracture: Secondary | ICD-10-CM | POA: Insufficient documentation

## 2023-04-09 DIAGNOSIS — M25519 Pain in unspecified shoulder: Secondary | ICD-10-CM | POA: Diagnosis not present

## 2023-04-09 DIAGNOSIS — M5030 Other cervical disc degeneration, unspecified cervical region: Secondary | ICD-10-CM | POA: Diagnosis not present

## 2023-04-09 DIAGNOSIS — S6992XA Unspecified injury of left wrist, hand and finger(s), initial encounter: Secondary | ICD-10-CM | POA: Diagnosis present

## 2023-04-09 DIAGNOSIS — S52572A Other intraarticular fracture of lower end of left radius, initial encounter for closed fracture: Secondary | ICD-10-CM | POA: Diagnosis not present

## 2023-04-09 DIAGNOSIS — W19XXXA Unspecified fall, initial encounter: Secondary | ICD-10-CM | POA: Diagnosis not present

## 2023-04-09 DIAGNOSIS — Y92094 Garage of other non-institutional residence as the place of occurrence of the external cause: Secondary | ICD-10-CM | POA: Diagnosis not present

## 2023-04-09 DIAGNOSIS — S52502A Unspecified fracture of the lower end of left radius, initial encounter for closed fracture: Secondary | ICD-10-CM | POA: Insufficient documentation

## 2023-04-09 DIAGNOSIS — S0083XA Contusion of other part of head, initial encounter: Secondary | ICD-10-CM | POA: Diagnosis not present

## 2023-04-09 DIAGNOSIS — I10 Essential (primary) hypertension: Secondary | ICD-10-CM | POA: Diagnosis not present

## 2023-04-09 DIAGNOSIS — S12601A Unspecified nondisplaced fracture of seventh cervical vertebra, initial encounter for closed fracture: Secondary | ICD-10-CM | POA: Insufficient documentation

## 2023-04-09 DIAGNOSIS — I6523 Occlusion and stenosis of bilateral carotid arteries: Secondary | ICD-10-CM | POA: Diagnosis not present

## 2023-04-09 DIAGNOSIS — S129XXA Fracture of neck, unspecified, initial encounter: Secondary | ICD-10-CM | POA: Diagnosis not present

## 2023-04-09 DIAGNOSIS — M7989 Other specified soft tissue disorders: Secondary | ICD-10-CM | POA: Diagnosis not present

## 2023-04-09 DIAGNOSIS — R410 Disorientation, unspecified: Secondary | ICD-10-CM | POA: Diagnosis not present

## 2023-04-09 DIAGNOSIS — M25552 Pain in left hip: Secondary | ICD-10-CM | POA: Diagnosis not present

## 2023-04-09 DIAGNOSIS — I672 Cerebral atherosclerosis: Secondary | ICD-10-CM | POA: Diagnosis not present

## 2023-04-09 DIAGNOSIS — Z7982 Long term (current) use of aspirin: Secondary | ICD-10-CM | POA: Insufficient documentation

## 2023-04-09 DIAGNOSIS — R9431 Abnormal electrocardiogram [ECG] [EKG]: Secondary | ICD-10-CM | POA: Diagnosis not present

## 2023-04-09 DIAGNOSIS — Z96642 Presence of left artificial hip joint: Secondary | ICD-10-CM | POA: Diagnosis not present

## 2023-04-09 DIAGNOSIS — M25559 Pain in unspecified hip: Secondary | ICD-10-CM | POA: Diagnosis not present

## 2023-04-09 DIAGNOSIS — S12101A Unspecified nondisplaced fracture of second cervical vertebra, initial encounter for closed fracture: Secondary | ICD-10-CM | POA: Diagnosis not present

## 2023-04-09 DIAGNOSIS — M1812 Unilateral primary osteoarthritis of first carpometacarpal joint, left hand: Secondary | ICD-10-CM | POA: Diagnosis not present

## 2023-04-09 DIAGNOSIS — M8580 Other specified disorders of bone density and structure, unspecified site: Secondary | ICD-10-CM | POA: Diagnosis not present

## 2023-04-09 LAB — COMPREHENSIVE METABOLIC PANEL
ALT: 11 U/L (ref 0–44)
AST: 15 U/L (ref 15–41)
Albumin: 4.1 g/dL (ref 3.5–5.0)
Alkaline Phosphatase: 69 U/L (ref 38–126)
Anion gap: 9 (ref 5–15)
BUN: 22 mg/dL (ref 8–23)
CO2: 25 mmol/L (ref 22–32)
Calcium: 9 mg/dL (ref 8.9–10.3)
Chloride: 106 mmol/L (ref 98–111)
Creatinine, Ser: 0.99 mg/dL (ref 0.44–1.00)
GFR, Estimated: 55 mL/min — ABNORMAL LOW (ref >60)
Glucose, Bld: 89 mg/dL (ref 70–99)
Potassium: 3.9 mmol/L (ref 3.5–5.1)
Sodium: 140 mmol/L (ref 135–145)
Total Bilirubin: 0.7 mg/dL (ref 0.3–1.2)
Total Protein: 6.6 g/dL (ref 6.5–8.1)

## 2023-04-09 LAB — CBC WITH DIFFERENTIAL/PLATELET
Abs Immature Granulocytes: 0.03 10*3/uL (ref 0.00–0.07)
Basophils Absolute: 0 10*3/uL (ref 0.0–0.1)
Basophils Relative: 1 %
Eosinophils Absolute: 0.1 10*3/uL (ref 0.0–0.5)
Eosinophils Relative: 1 %
HCT: 40.3 % (ref 36.0–46.0)
Hemoglobin: 12.6 g/dL (ref 12.0–15.0)
Immature Granulocytes: 1 %
Lymphocytes Relative: 17 %
Lymphs Abs: 1 10*3/uL (ref 0.7–4.0)
MCH: 28.8 pg (ref 26.0–34.0)
MCHC: 31.3 g/dL (ref 30.0–36.0)
MCV: 92.2 fL (ref 80.0–100.0)
Monocytes Absolute: 0.4 10*3/uL (ref 0.1–1.0)
Monocytes Relative: 7 %
Neutro Abs: 4.5 10*3/uL (ref 1.7–7.7)
Neutrophils Relative %: 73 %
Platelets: 177 10*3/uL (ref 150–400)
RBC: 4.37 MIL/uL (ref 3.87–5.11)
RDW: 14.2 % (ref 11.5–15.5)
WBC: 6.1 10*3/uL (ref 4.0–10.5)
nRBC: 0 % (ref 0.0–0.2)

## 2023-04-09 MED ORDER — FENTANYL CITRATE PF 50 MCG/ML IJ SOSY
12.5000 ug | PREFILLED_SYRINGE | Freq: Once | INTRAMUSCULAR | Status: AC
  Administered 2023-04-09: 12.5 ug via INTRAVENOUS
  Filled 2023-04-09: qty 1

## 2023-04-09 MED ORDER — ONDANSETRON HCL 4 MG/2ML IJ SOLN
4.0000 mg | Freq: Once | INTRAMUSCULAR | Status: AC | PRN
  Administered 2023-04-09: 4 mg via INTRAVENOUS
  Filled 2023-04-09: qty 2

## 2023-04-09 MED ORDER — IOHEXOL 350 MG/ML SOLN
75.0000 mL | Freq: Once | INTRAVENOUS | Status: AC | PRN
  Administered 2023-04-09: 75 mL via INTRAVENOUS

## 2023-04-09 MED ORDER — LORAZEPAM 1 MG PO TABS
1.0000 mg | ORAL_TABLET | Freq: Once | ORAL | Status: AC
  Administered 2023-04-09: 1 mg via ORAL
  Filled 2023-04-09: qty 1

## 2023-04-09 NOTE — ED Provider Notes (Signed)
[place] EMERGENCY DEPARTMENT AT [facility] Provider Note   CSN: [id number] Arrival date & time: 04/09/23  8469     History  Chief Complaint  Patient presents with   Michaela Simpson Michaela Simpson is a 89 y.o. female.  Patient to ED after fall 2 days ago. She reports she was in her garage and fell to the floor. She cannot remember details of the fall but does not feel she lost consciousness and does not remember chest pain or dizziness prior to fall. She was able to ambulate with a cane to a neighbor's house. She reports left wrist pain immediately after the fall, and "hurts all over" progressively over the last 2 days. No significant headache. No pain with breathing, no SOB, no nausea or vomiting. Daughter Aram Beecham is at bedside and reports she is oriented at baseline but is generally weak.   The history is provided by the patient and a relative. No language interpreter was used.  Fall       Home Medications Prior to Admission medications   Medication Sig Start Date End Date Taking? Authorizing Provider  acetaminophen (TYLENOL) 325 MG tablet Take 2 tablets (650 mg total) by mouth every 6 (six) hours as needed for mild pain (or Fever >/= 101). 03/17/15   Calvert Cantor, MD  amLODipine (NORVASC) 5 MG tablet Dorena Bodo, FNP 09/30/22   Novella Olive, FNP  aspirin EC 81 MG tablet Take 81 mg by mouth daily.    [provider]  azithromycin (ZITHROMAX) 500 MG tablet Take 1 tablet (500 mg) one hour prior to all dental visits. 03/09/22   Tonny Bollman, MD  Boswellia-Glucosamine-Vit D (OSTEO BI-FLEX ONE PER DAY PO) Take 1 tablet by mouth daily.    [provider]  Cholecalciferol (VITAMIN D-3) 125 MCG (5000 UT) TABS Take 1 tablet by mouth daily. 09/17/19   Hilts, Michael, MD  clopidogrel (PLAVIX) 75 MG tablet TAKE 1 TABLET(75 MG) BY MOUTH DAILY 02/15/23   Maeola Harman, MD  Lactobacillus (PROBIOTIC ACIDOPHILUS PO) Take 1 tablet by mouth daily.     [provider]  magnesium oxide (MAG-OX) 400 (240 Mg) MG tablet TAKE 1 TABLET(400 MG) BY MOUTH DAILY 09/09/22   de [place], Buren Kos, MD  Menatetrenone (VITAMIN K2) 100 MCG TABS Take 100 mcg by mouth daily. 09/17/19   Hilts, Casimiro Needle, MD  rosuvastatin (CRESTOR) 10 MG tablet TAKE 1 TABLET(10 MG) BY MOUTH DAILY 03/09/23   de [place], Buren Kos, MD  sertraline (ZOLOFT) 100 MG tablet Take 1.5 tablets (150 mg total) by mouth daily. 03/18/23   de [place], Buren Kos, MD  traMADol (ULTRAM) 50 MG tablet Take 1 tablet (50 mg total) by mouth every 6 (six) hours as needed. Patient not taking: Reported on 03/28/2023 11/08/22   Kirtland Bouchard, PA-C  vitamin B-12 (CYANOCOBALAMIN) 1000 MCG tablet Take 1,000 mcg by mouth daily.    [provider]      Allergies    Penicillins and Codeine    Review of Systems   Review of Systems  Physical Exam Updated Vital Signs BP 129/65 (BP Location: Right Arm)   Pulse 75   Temp 98.2 F (36.8 C) (Oral)   Resp 16   Ht 5\' 1"  (1.549 m)   Wt 49.9 kg   SpO2 95%   BMI 20.78 kg/m  Physical Exam Vitals and nursing note reviewed.  Constitutional:      General: She is not in acute distress. HENT:  Head: Normocephalic.     Comments: Occipital hematoma without wound.    Right Ear: Tympanic membrane and ear canal normal.     Left Ear: Tympanic membrane and ear canal normal.  Eyes:     Conjunctiva/sclera: Conjunctivae normal.     Comments: Right pupil 3-4 mm, Left pupil 2 mm - chronic with h/o cataract surgery  Neck:     Comments: There is midline and bilateral cervical tenderness.  Cardiovascular:     Rate and Rhythm: Normal rate and regular rhythm.     Heart sounds: No murmur heard. Pulmonary:     Effort: Pulmonary effort is normal.     Breath sounds: No wheezing, rhonchi or rales.     Comments: No bruising of chest wall Chest:     Chest wall: No tenderness.  Abdominal:     General: There is no distension.     Palpations: Abdomen is soft.      Tenderness: There is no abdominal tenderness.     Comments: No abdominal wall bruising.  Musculoskeletal:     Cervical back: Normal range of motion and neck supple.     Comments: Left wrist tender to palpation and with any movement. No forearm, humeral or shoulder bony tenderness or deformity of upper extremities. Right wrist and hand nontender without swelling or bruising. There is left hip pain with passive movement of the leg. Internal rotation without shortening. Right LE nontender with any ROM.   Neurological:     Mental Status: She is alert and oriented to person, place, and time.     Comments: Sluggish to response but speech is clear. Follows command. No CN deficits.      ED Results / Procedures / Treatments   Labs (all labs ordered are listed, but only abnormal results are displayed) Labs Reviewed  COMPREHENSIVE METABOLIC PANEL - Abnormal; Notable for the following components:      Result Value   GFR, Estimated 55 (*)    All other components within normal limits  CBC WITH DIFFERENTIAL/PLATELET  URINALYSIS, ROUTINE W REFLEX MICROSCOPIC   Results for orders placed or performed during the hospital encounter of 04/09/23 (from the past 24 hour(s))  CBC with Differential     Status: None   Collection Time: 04/09/23  9:23 AM  Result Value Ref Range   WBC 6.1 4.0 - 10.5 K/uL   RBC 4.37 3.87 - 5.11 MIL/uL   Hemoglobin 12.6 12.0 - 15.0 g/dL   HCT 16.1 09.6 - 04.5 %   MCV 92.2 80.0 - 100.0 fL   MCH 28.8 26.0 - 34.0 pg   MCHC 31.3 30.0 - 36.0 g/dL   RDW 40.9 81.1 - 91.4 %   Platelets 177 150 - 400 K/uL   nRBC 0.0 0.0 - 0.2 %   Neutrophils Relative % 73 %   Neutro Abs 4.5 1.7 - 7.7 K/uL   Lymphocytes Relative 17 %   Lymphs Abs 1.0 0.7 - 4.0 K/uL   Monocytes Relative 7 %   Monocytes Absolute 0.4 0.1 - 1.0 K/uL   Eosinophils Relative 1 %   Eosinophils Absolute 0.1 0.0 - 0.5 K/uL   Basophils Relative 1 %   Basophils Absolute 0.0 0.0 - 0.1 K/uL   Immature Granulocytes 1 %   Abs  Immature Granulocytes 0.03 0.00 - 0.07 K/uL  Comprehensive metabolic panel     Status: Abnormal   Collection Time: 04/09/23  9:23 AM  Result Value Ref Range   Sodium 140 135 - 145  mmol/L   Potassium 3.9 3.5 - 5.1 mmol/L   Chloride 106 98 - 111 mmol/L   CO2 25 22 - 32 mmol/L   Glucose, Bld 89 70 - 99 mg/dL   BUN 22 8 - 23 mg/dL   Creatinine, Ser 1.91 0.44 - 1.00 mg/dL   Calcium 9.0 8.9 - 47.8 mg/dL   Total Protein 6.6 6.5 - 8.1 g/dL   Albumin 4.1 3.5 - 5.0 g/dL   AST 15 15 - 41 U/L   ALT 11 0 - 44 U/L   Alkaline Phosphatase 69 38 - 126 U/L   Total Bilirubin 0.7 0.3 - 1.2 mg/dL   GFR, Estimated 55 (L) >60 mL/min   Anion gap 9 5 - 15   EKG EKG Interpretation Date/Time:  Saturday April 09 2023 09:40:51 EDT Ventricular Rate:  83 PR Interval:  164 QRS Duration:  103 QT Interval:  422 QTC Calculation: 496 R Axis:   -34  Text Interpretation: Sinus rhythm Left axis deviation Low voltage, precordial leads Borderline prolonged QT interval Confirmed by Tanda Rockers ([id number]) on 04/09/2023 10:19:40 AM  Radiology CT ANGIO HEAD NECK W WO CM  Result Date: 04/09/2023 CLINICAL DATA:  cervical fractures EXAM: CT ANGIOGRAPHY HEAD AND NECK WITH AND WITHOUT CONTRAST TECHNIQUE: Multidetector CT imaging of the head and neck was performed using the standard protocol during bolus administration of intravenous contrast. Multiplanar CT image reconstructions and MIPs were obtained to evaluate the vascular anatomy. Carotid stenosis measurements (when applicable) are obtained utilizing NASCET criteria, using the distal internal carotid diameter as the denominator. RADIATION DOSE REDUCTION: This exam was performed according to the departmental dose-optimization program which includes automated exposure control, adjustment of the mA and/or kV according to patient size and/or use of iterative reconstruction technique. CONTRAST:  75mL OMNIPAQUE IOHEXOL 350 MG/ML SOLN COMPARISON:  None Available. FINDINGS: CTA NECK  FINDINGS Aortic arch: Atherosclerosis. Great vessel origins are patent without significant stenosis. Right carotid system: No evidence of dissection, stenosis (50% or greater), or occlusion. Left carotid system: No evidence of dissection, stenosis (50% or greater), or occlusion. Vertebral arteries: Right dominant. No evidence of dissection, stenosis (50% or greater), or occlusion. Skeleton: Known fracture characterized on same day CT of the cervical spine Other neck: Negative. Upper chest: Visualized lung apices are clear. Review of the MIP images confirms the above findings CTA HEAD FINDINGS Anterior circulation: Bilateral intracranial ICAs, MCAs, and ACAs are patent without proximal hemodynamically significant stenosis. Moderate right paraclinoid ICAs stenosis Posterior circulation: Bilateral intradural vertebral arteries, basilar artery and posterior cerebral arteries are patent without proximal hemodynamically significant stenosis. Bilateral fetal type PCAs with small vertebrobasilar system, anatomic variant. Venous sinuses: As permitted by contrast timing, patent. Anatomic variants: See above. Review of the MIP images confirms the above findings IMPRESSION: 1. No emergent large vessel occlusion or evidence of acute arterial injury. 2. Moderate right paraclinoid ICA stenosis. Electronically Signed   By: Feliberto Harts M.D.   On: 04/09/2023 12:16   CT Cervical Spine Wo Contrast  Result Date: 04/09/2023 CLINICAL DATA:  Fall with bruising to the hip. EXAM: CT CERVICAL SPINE WITHOUT CONTRAST TECHNIQUE: Multidetector CT imaging of the cervical spine was performed without intravenous contrast. Multiplanar CT image reconstructions were also generated. RADIATION DOSE REDUCTION: This exam was performed according to the departmental dose-optimization program which includes automated exposure control, adjustment of the mA and/or kV according to patient size and/or use of iterative reconstruction technique.  COMPARISON:  None Available. FINDINGS: Alignment: Reversal of cervical lordosis.  No traumatic malalignment Skull base and vertebrae: Acute fracture through the C2 right lateral mass and right transverse process with mild displacement at the level of the transverse process, CTA should be considered. No visible continuation across the dens. Nondisplaced fracture through the C7 right lateral mass. There is C7-T2 facet ankylosis. Soft tissues and spinal canal: Expected soft tissue swelling centered around the fracture. Disc levels: Generalized degenerative endplate and facet spurring. Facet spurring is most pronounced on the right at C3-4 and left at C2-3. Upper chest: Negative Critical Value/emergent results were called by telephone at the time of interpretation on 04/09/2023 at 11:20 am to provider [facility] Doren Kaspar , who verbally acknowledged these results. IMPRESSION: C2 right lateral mass fracture also involving the transverse process with mild displacement along the transverse foramen, consider CTA. C7 nondisplaced right articular process fracture. C7-T2 facet ankylosis. Electronically Signed   By: Tiburcio Pea M.D.   On: 04/09/2023 11:20   CT Head Wo Contrast  Result Date: 04/09/2023 CLINICAL DATA:  Fall a few days ago.  Confusion. EXAM: CT HEAD WITHOUT CONTRAST TECHNIQUE: Contiguous axial images were obtained from the base of the skull through the vertex without intravenous contrast. RADIATION DOSE REDUCTION: This exam was performed according to the departmental dose-optimization program which includes automated exposure control, adjustment of the mA and/or kV according to patient size and/or use of iterative reconstruction technique. COMPARISON:  CT head without contrast 09/19/2019 FINDINGS: Brain: Mild atrophy and white matter changes are stable. No acute infarct, hemorrhage, or mass lesion is present. Deep brain nuclei are within normal limits. The ventricles are of normal size. No significant extraaxial  fluid collection is present. The brainstem and cerebellum are within normal limits. Midline structures are within normal limits. Calcification or small meningioma along the right tentorium is stable. Vascular: No hyperdense vessel or unexpected calcification. Skull: Calvarium is intact. No focal lytic or blastic lesions are present. No significant extracranial soft tissue lesion is present. Sinuses/Orbits: Bilateral lens replacements are noted. Globes and orbits are otherwise unremarkable. The paranasal sinuses and mastoid air cells are clear. IMPRESSION: 1. No acute intracranial abnormality or significant interval change. 2. Stable atrophy and white matter disease. This likely reflects the sequela of chronic microvascular ischemia. Electronically Signed   By: Marin Roberts M.D.   On: 04/09/2023 11:15   DG Hip Unilat W or Wo Pelvis 2-3 Views Left  Result Date: 04/09/2023 CLINICAL DATA:  Fall several days ago.  Left hip injury and pain. EXAM: DG HIP (WITH OR WITHOUT PELVIS) 2-3V LEFT COMPARISON:  11/29/2022 FINDINGS: There is no evidence of acute hip fracture or dislocation. Bipolar left hip prosthesis noted as well as internal fixation hardware in the right hip. Old fractures of the left superior and inferior pubic rami are again seen. IMPRESSION: Bipolar left hip prosthesis. No evidence of hip fracture or dislocation. Old left superior and inferior pubic rami fractures. Electronically Signed   By: Danae Orleans M.D.   On: 04/09/2023 11:03   DG Wrist Complete Left  Result Date: 04/09/2023 CLINICAL DATA:  Fall several days ago. Left wrist pain and bruising. EXAM: LEFT WRIST - COMPLETE 3+ VIEW COMPARISON:  None Available. FINDINGS: Generalized osteopenia noted. A nondisplaced fracture is seen involving the distal radial metaphysis with intra-articular extension into the radiocarpal joint. No other acute fractures identified. No evidence of dislocation. Moderate osteoarthritis is seen involving the base  of the thumb. IMPRESSION: Nondisplaced fracture of the distal radial metaphysis, with intra-articular extension into the radiocarpal joint. Osteopenia.  Electronically Signed   By: Danae Orleans M.D.   On: 04/09/2023 10:58    Procedures Procedures    Medications Ordered in ED Medications  fentaNYL (SUBLIMAZE) injection 12.5 mcg (12.5 mcg Intravenous Given 04/09/23 0958)  ondansetron (ZOFRAN) injection 4 mg (4 mg Intravenous Given 04/09/23 1009)  iohexol (OMNIPAQUE) 350 MG/ML injection 75 mL (75 mLs Intravenous Contrast Given 04/09/23 1151)  LORazepam (ATIVAN) tablet 1 mg (1 mg Oral Given 04/09/23 1247)    ED Course/ Medical Decision Making/ A&P Clinical Course as of 04/09/23 1504  Sat Apr 09, 2023  1123 Call from radiology, Dr. Grace Isaac, informing the patient's CT c-spine shows 2 fractures. Collar applied immediately. Repeat neuro exam: reflexes equal and symmetric. No lateralizing weakness. No sensory deficits.  [SU]    Clinical Course User Index [SU] Elpidio Anis, PA-C                                 Medical Decision Making Patient to ED 2 days post-fall at home. Has been ambulatory. Reporting left wrist pain. Remembers the fall, does not suspect synocpe or LOC.   This patient presents to the ED for concern of fall, this involves an extensive number of treatment options, and is a complaint that carries with it a high risk of complications and morbidity.  The differential diagnosis includes head injury, fracture, illness/sepsis, syncope, arrhythmia, CVA or neurologic event   Co morbidities that complicate the patient evaluation  On Plavix, h/o TAVR   Additional history obtained:  Additional history obtained from daughter, Aram Beecham External records from outside source obtained and reviewed including EMR   Lab Tests:  I Ordered, and personally interpreted labs.  The pertinent results include:  CBC, Cmet unremarkable    Imaging Studies ordered:  I ordered imaging studies  including left wrist, Head and neck CT, pelvis with left hip.   I independently visualized and interpreted imaging which showed Left wrist: ND distal radius fracture; pelvis/hip - old pubic rami fractures, bilateral hip ORIF intact; Head CT (per radiology) negative; CT c-spine (per radiology) - C2 right lateral mass fracture and transverse process fracture; C7 ND right articular process fracture.  CTA head and neck: no large vessel stenosis I agree with the radiologist interpretation   Cardiac Monitoring: / EKG:  The patient was maintained on a cardiac monitor.  I personally viewed and interpreted the cardiac monitored which showed an underlying rhythm of: NSR, borderline prolonged QT, left axis deviation   Consultations Obtained:  I requested consultation with the neurosurgery,  and discussed lab and imaging findings as well as pertinent plan - they recommend: Collar at all times, office follow up ([facility])   Problem List / ED Course / Critical interventions / Medication management  I ordered medication including 12.5 mg IV Fentanyl for pain with improvement in comfort  Reevaluation of the patient after these medicines showed that the patient improved I have reviewed the patients home medicines and have made adjustments as needed   Social Determinants of Health:  Lives alone.    Test / Admission - Considered:  No admission is felt indicated. Family has arranged in home care so she won't be at home alone. Stressed collar and splint to left wrist need to remain on until follow up. Instructions provided. Recommended Tylenol for pain.    Amount and/or Complexity of Data Reviewed Labs: ordered. Radiology: ordered.  Risk Prescription drug management.  Final Clinical Impression(s) / ED Diagnoses Final diagnoses:  Fall, initial encounter  Other closed nondisplaced fracture of second cervical vertebra, initial encounter (HCC)  Closed nondisplaced fracture of  seventh cervical vertebra, unspecified fracture morphology, initial encounter [facility])  Closed fracture of distal end of left radius, unspecified fracture morphology, initial encounter    Rx / DC Orders ED Discharge Orders     None         Elpidio Anis, PA-C 04/09/23 1504    Sloan Leiter, DO 04/10/23 0719

## 2023-04-09 NOTE — Discharge Instructions (Signed)
You must wear the collar on your neck and the splint on your wrist at all times.   Neck fractures: please follow up with Dr. Danielle Dess by calling the office in the morning to schedule a time to be seen this week.   Wrist fracture: follow up with Dr. [place] Schimke for management of the wrist fracture.   Recommend Tylenol 1000 mg 4 times daily (maximum daily dose) for pain.   Return to the ED with any concerning symptoms at any time.

## 2023-04-09 NOTE — ED Notes (Signed)
Pt verbalized her right eye pupil is larger than her left due to a cataract surgery she had previously.

## 2023-04-09 NOTE — ED Notes (Signed)
Pt returned from xray

## 2023-04-09 NOTE — ED Triage Notes (Signed)
Pt arrived [facility] from home with c/o a fall a few days ago. Pt is not exactly what days it was. Pt has bruising to left hip and paqin to right arm above her elbow. Pt wrapped her left wrist and put it in a sling herself d/t pain. Pt ambulated to the door for [facility] with a cane after she used life alert for [facility]. CBG 119. Pt is A&Ox3.

## 2023-04-11 ENCOUNTER — Encounter (HOSPITAL_BASED_OUTPATIENT_CLINIC_OR_DEPARTMENT_OTHER): Payer: Self-pay | Admitting: Family Medicine

## 2023-04-11 ENCOUNTER — Telehealth: Payer: Self-pay | Admitting: Orthopedic Surgery

## 2023-04-11 NOTE — Telephone Encounter (Signed)
Returned a call to [facility] regarding scheduling the patient.  She went to the ED at [facility], they told her to f/u with Dr. [place] Schimke, he was on call.  They appreciated the call back, but would like to f/u with Dr. Magnus Ivan bc the patient is established w/him.

## 2023-04-12 ENCOUNTER — Encounter (HOSPITAL_BASED_OUTPATIENT_CLINIC_OR_DEPARTMENT_OTHER): Payer: Self-pay | Admitting: Family Medicine

## 2023-04-12 DIAGNOSIS — S129XXA Fracture of neck, unspecified, initial encounter: Secondary | ICD-10-CM

## 2023-04-13 NOTE — Telephone Encounter (Signed)
Message received from pt's daughter Alvis Lemmings. Called and spoke with [facility] about the [place] message that was sent.   Dawn tried to see if she could get an appt scheduled for pt with either Dr. Lovell Sheehan or Dr. Wynetta Emery but she was told that a referral would need to be sent over by PCP as she is trying to get pt in for an appt with neurosurgery ASAP.   Dr. De Peru, please advise on this for pt.

## 2023-04-13 NOTE — Telephone Encounter (Signed)
Mychart message sent to pt's daughter letting her know that the referral for pt to see either Dr. Wynetta Emery or Dr. Lovell Sheehan was placed.

## 2023-04-17 ENCOUNTER — Encounter: Payer: Self-pay | Admitting: Orthopaedic Surgery

## 2023-04-17 ENCOUNTER — Encounter (HOSPITAL_BASED_OUTPATIENT_CLINIC_OR_DEPARTMENT_OTHER): Payer: Self-pay | Admitting: Family Medicine

## 2023-04-18 ENCOUNTER — Ambulatory Visit
Admission: RE | Admit: 2023-04-18 | Discharge: 2023-04-18 | Disposition: A | Discharge status: Home / Self Care | Payer: Medicare Other | Source: Ambulatory Visit | Attending: Nurse Practitioner | Admitting: Nurse Practitioner

## 2023-04-18 ENCOUNTER — Other Ambulatory Visit (INDEPENDENT_AMBULATORY_CARE_PROVIDER_SITE_OTHER): Payer: Medicare Other

## 2023-04-18 ENCOUNTER — Ambulatory Visit (INDEPENDENT_AMBULATORY_CARE_PROVIDER_SITE_OTHER): Payer: Medicare Other | Admitting: Orthopaedic Surgery

## 2023-04-18 ENCOUNTER — Encounter: Payer: Self-pay | Admitting: Orthopaedic Surgery

## 2023-04-18 ENCOUNTER — Other Ambulatory Visit (INDEPENDENT_AMBULATORY_CARE_PROVIDER_SITE_OTHER): Payer: Self-pay

## 2023-04-18 ENCOUNTER — Other Ambulatory Visit (HOSPITAL_BASED_OUTPATIENT_CLINIC_OR_DEPARTMENT_OTHER): Payer: Self-pay | Admitting: Family Medicine

## 2023-04-18 ENCOUNTER — Telehealth: Payer: Self-pay | Admitting: Orthopedic Surgery

## 2023-04-18 ENCOUNTER — Ambulatory Visit (HOSPITAL_BASED_OUTPATIENT_CLINIC_OR_DEPARTMENT_OTHER): Payer: Medicare Other | Admitting: Family Medicine

## 2023-04-18 VITALS — BP 155/88 | HR 91 | Temp 98.3°F | Resp 18

## 2023-04-18 DIAGNOSIS — M25532 Pain in left wrist: Secondary | ICD-10-CM

## 2023-04-18 DIAGNOSIS — M542 Cervicalgia: Secondary | ICD-10-CM

## 2023-04-18 DIAGNOSIS — M25521 Pain in right elbow: Secondary | ICD-10-CM

## 2023-04-18 DIAGNOSIS — Z0000 Encounter for general adult medical examination without abnormal findings: Secondary | ICD-10-CM

## 2023-04-18 DIAGNOSIS — S52532D Colles' fracture of left radius, subsequent encounter for closed fracture with routine healing: Secondary | ICD-10-CM | POA: Diagnosis not present

## 2023-04-18 DIAGNOSIS — N3001 Acute cystitis with hematuria: Secondary | ICD-10-CM | POA: Diagnosis not present

## 2023-04-18 LAB — POCT URINALYSIS DIP (MANUAL ENTRY)
Glucose, UA: NEGATIVE mg/dL
Nitrite, UA: POSITIVE — AB
Spec Grav, UA: >=1.03 — AB (ref 1.010–1.025)
Urobilinogen, UA: 0.2 U/dL
pH, UA: 5 (ref 5.0–8.0)

## 2023-04-18 MED ORDER — MAGNESIUM OXIDE -MG SUPPLEMENT 400 (240 MG) MG PO TABS: ORAL_TABLET | ORAL | 1 refills | Status: DC

## 2023-04-18 MED ORDER — SULFAMETHOXAZOLE-TRIMETHOPRIM 800-160 MG PO TABS
1.0000 | ORAL_TABLET | Freq: Two times a day (BID) | ORAL | 0 refills | Status: AC
Start: 2023-04-18 — End: 2023-04-23

## 2023-04-18 NOTE — ED Provider Notes (Signed)
[facility]    CSN: [id number] Arrival date & time: 04/18/23  1244      History   Chief Complaint Chief Complaint  Patient presents with   Altered Mental Status    Concern of possible UTI & unable to get appointment at PCP - Entered by patient   Hematuria    HPI Michaela Simpson is a 89 y.o. female.   Patient presents today with female caregiver for 1 week history of unsteadiness, increased falls, and intermittent confusion.  Also has foul smelling urine per caregiver's report.  Patient has told caregiver a couple of times that she has blood on the toilet paper when she wipes.  No dysuria, urinary frequency, urgency, fever, abdominal pain, nausea/vomiting.  Wears a panty liner all of the time for incontinence.  No recent UTI.  Denies antibiotic use in the past 90 days.    Past Medical History:  Diagnosis Date   Acute blood loss anemia 03/22/2015   Acute renal failure syndrome (HCC)    AKI (acute kidney injury) (HCC)    Anemia    Aortic stenosis    Bilateral carpal tunnel syndrome 05/15/2018   Bruising    large bruise to left neck and T/O body   Chronic constipation 03/15/2019   Closed left humeral fracture 03/08/2019   Closed right hip fracture (HCC) 03/14/2015   Depression    Early cataract    Fracture of femoral neck, left, closed (HCC) 03/08/2019   Headache    Hip fracture (HCC)    HTN (hypertension)    Humerus fracture    Leukocytosis    Major depression, recurrent, chronic (HCC) 03/15/2019   Osteonecrosis (HCC)    post-traumatic osteonecrosis of the femoral head of the left hip, retained orthopedic hardware ( order for conset)   Osteoporosis    Peripheral vascular disease (HCC)    blood clot right leg   Protein-calorie malnutrition, severe 09/21/2019   Rheumatoid arthritis (HCC)    S/P TAVR (transcatheter aortic valve replacement) 08/08/2019   Sequelae of open wound of right lower extremity 03/15/2019   Severe aortic stenosis 07/19/2019   Thoracic  ascending aortic aneurysm (HCC)    4.4 cm by 07/26/19 CTA   Traumatic arthritis of left hip 10/02/2019   Ulcer of lower extremity (HCC)    Wears glasses     Patient Active Problem List   Diagnosis Date Noted   Viral URI 04/16/2021   Insomnia 03/03/2021   Urinary incontinence 12/16/2020   Epistaxis 12/16/2020   Traumatic arthritis of left hip 10/02/2019   Status post total replacement of left hip 10/02/2019   Protein-calorie malnutrition, severe 09/21/2019   Closed pelvic fracture (HCC) 09/19/2019   S/P TAVR (transcatheter aortic valve replacement) 08/08/2019   Severe aortic stenosis 07/19/2019   Essential hypertension, benign 03/15/2019   Sequelae of open wound of right lower extremity 03/15/2019   Major depression, recurrent, chronic (HCC) 03/15/2019   Chronic constipation 03/15/2019   Closed left humeral fracture 03/08/2019   Wound of right leg, initial encounter 03/08/2019   Leukocytosis 03/08/2019   Fracture of femoral neck, left, closed (HCC) 03/08/2019   AKI (acute kidney injury) (HCC) 03/08/2019   Depression    HTN (hypertension)    Bilateral carpal tunnel syndrome 05/15/2018   Rash and nonspecific skin eruption 04/06/2015   Acute blood loss anemia 03/22/2015   Edema 03/21/2015   Acute renal failure syndrome (HCC)    Closed right hip fracture (HCC) 03/14/2015   Rheumatoid arthritis (HCC)  03/14/2015   Fall 03/14/2015    Past Surgical History:  Procedure Laterality Date   ABDOMINAL AORTOGRAM W/LOWER EXTREMITY N/A 05/08/2019   Procedure: ABDOMINAL AORTOGRAM W/LOWER EXTREMITY;  Surgeon: Nada Libman, MD;  Location: [facility];  Service: Cardiovascular;  Laterality: N/A;   CATARACT EXTRACTION W/PHACO Left 10/24/2020   Procedure: CATARACT EXTRACTION PHACO AND INTRAOCULAR LENS PLACEMENT LEFT EYE;  Surgeon: Fabio Pierce, MD;  Location: [facility];  Service: Ophthalmology;  Laterality: Left;  CDE  20.05   CATARACT EXTRACTION W/PHACO Right 11/07/2020   Procedure:  CATARACT EXTRACTION PHACO AND INTRAOCULAR LENS PLACEMENT RIGHT EYE;  Surgeon: Fabio Pierce, MD;  Location: [facility];  Service: Ophthalmology;  Laterality: Right;  right, CDE=24.26   HARDWARE REMOVAL Left 10/02/2019   Procedure: HARDWARE REMOVAL;  Surgeon: Kathryne Hitch, MD;  Location: [facility] OR;  Service: Orthopedics;  Laterality: Left;   HIP PINNING,CANNULATED Left 03/09/2019   Procedure: CANNULATED HIP PINNING;  Surgeon: Kathryne Hitch, MD;  Location: [facility] OR;  Service: Orthopedics;  Laterality: Left;   INTRAMEDULLARY (IM) NAIL INTERTROCHANTERIC Right 03/14/2015   Procedure: RIGHT INTERTROCHANTRIC INTRAMEDULLARY (IM) NAIL ;  Surgeon: Kathryne Hitch, MD;  Location: [facility] OR;  Service: Orthopedics;  Laterality: Right;   PERIPHERAL VASCULAR INTERVENTION Right 05/08/2019   Procedure: PERIPHERAL VASCULAR INTERVENTION;  Surgeon: Nada Libman, MD;  Location: [facility];  Service: Cardiovascular;  Laterality: Right;   RIGHT/LEFT HEART CATH AND CORONARY ANGIOGRAPHY N/A 07/19/2019   Procedure: RIGHT/LEFT HEART CATH AND CORONARY ANGIOGRAPHY;  Surgeon: Tonny Bollman, MD;  Location: [facility] INVASIVE CV LAB;  Service: Cardiovascular;  Laterality: N/A;   TEE WITHOUT CARDIOVERSION N/A 08/07/2019   Procedure: TRANSESOPHAGEAL ECHOCARDIOGRAM (TEE);  Surgeon: Tonny Bollman, MD;  Location: [facility] INVASIVE CV LAB;  Service: Open Heart Surgery;  Laterality: N/A;   TOTAL HIP ARTHROPLASTY Left 10/02/2019   Procedure: LEFT TOTAL HIP ARTHROPLASTY ANTERIOR APPROACH AND REMOVAL OF CANNULATED SCREWS OF LEFT HIP;  Surgeon: Kathryne Hitch, MD;  Location: [facility] OR;  Service: Orthopedics;  Laterality: Left;   TRANSCATHETER AORTIC VALVE REPLACEMENT, TRANSFEMORAL  08/07/2019   TRANSCATHETER AORTIC VALVE REPLACEMENT, TRANSFEMORAL N/A 08/07/2019   Procedure: TRANSCATHETER AORTIC VALVE REPLACEMENT, TRANSFEMORAL;  Surgeon: Tonny Bollman, MD;  Location: [facility] INVASIVE CV LAB;  Service: Open Heart Surgery;   Laterality: N/A;    OB History   No obstetric history on file.      Home Medications    Prior to Admission medications   Medication Sig Start Date End Date Taking? Authorizing Provider  acetaminophen (TYLENOL) 325 MG tablet Take 2 tablets (650 mg total) by mouth every 6 (six) hours as needed for mild pain (or Fever >/= 101). 03/17/15  Yes Rizwan, Saima, MD  Boswellia-Glucosamine-Vit D (OSTEO BI-FLEX ONE PER DAY PO) Take 1 tablet by mouth daily.   Yes [provider]  Cholecalciferol (VITAMIN D-3) 125 MCG (5000 UT) TABS Take 1 tablet by mouth daily. 09/17/19  Yes Hilts, Casimiro Needle, MD  clopidogrel (PLAVIX) 75 MG tablet TAKE 1 TABLET(75 MG) BY MOUTH DAILY 02/15/23  Yes Maeola Harman, MD  Lactobacillus (PROBIOTIC ACIDOPHILUS PO) Take 1 tablet by mouth daily.   Yes [provider]  Menatetrenone (VITAMIN K2) 100 MCG TABS Take 100 mcg by mouth daily. 09/17/19  Yes Hilts, Casimiro Needle, MD  rosuvastatin (CRESTOR) 10 MG tablet TAKE 1 TABLET(10 MG) BY MOUTH DAILY 03/09/23  Yes de [place], Raymond J, MD  sertraline (ZOLOFT) 100 MG tablet Take 1.5 tablets (150 mg total) by mouth  daily. 03/18/23  Yes de [place], Raymond J, MD  sulfamethoxazole-trimethoprim (BACTRIM DS) 800-160 MG tablet Take 1 tablet by mouth every 12 (twelve) hours for 5 days. 04/18/23 04/23/23 Yes Valentino Nose, NP  vitamin B-12 (CYANOCOBALAMIN) 1000 MCG tablet Take 1,000 mcg by mouth daily.   Yes [provider]  amLODipine (NORVASC) 5 MG tablet TAKE 1 TABLET BY MOUTH DAILY. 04/18/23   de [place], Raymond J, MD  aspirin EC 81 MG tablet Take 81 mg by mouth daily.    [provider]  azithromycin (ZITHROMAX) 500 MG tablet Take 1 tablet (500 mg) one hour prior to all dental visits. 03/09/22   Tonny Bollman, MD  magnesium oxide (MAG-OX) 400 (240 Mg) MG tablet TAKE 1 TABLET(400 MG) BY MOUTH DAILY 04/18/23   de [place], Buren Kos, MD  traMADol (ULTRAM) 50 MG tablet Take 1 tablet (50 mg total) by mouth every 6  (six) hours as needed. Patient not taking: Reported on 03/28/2023 11/08/22   Kirtland Bouchard, PA-C    Family History Family History  Problem Relation Age of Onset   Cancer Sister     Social History Social History   Tobacco Use   Smoking status: Never   Smokeless tobacco: Never  Vaping Use   Vaping status: Never Used  Substance Use Topics   Alcohol use: No   Drug use: No     Allergies   Penicillins and Codeine   Review of Systems Review of Systems Per HPI  Physical Exam Triage Vital Signs ED Triage Vitals  Encounter Vitals Group     BP 04/18/23 1259 (!) 155/88     Systolic BP Percentile --      Diastolic BP Percentile --      Pulse Rate 04/18/23 1259 91     Resp 04/18/23 1259 18     Temp 04/18/23 1259 98.3 F (36.8 C)     Temp Source 04/18/23 1259 Oral     SpO2 04/18/23 1259 97 %     Weight --      Height --      Head Circumference --      Peak Flow --      Pain Score 04/18/23 1300 0     Pain Loc --      Pain Education --      Exclude from Growth Chart --    No data found.  Updated Vital Signs BP (!) 155/88 (BP Location: Right Arm)   Pulse 91   Temp 98.3 F (36.8 C) (Oral)   Resp 18   SpO2 97%   Visual Acuity Right Eye Distance:   Left Eye Distance:   Bilateral Distance:    Right Eye Near:   Left Eye Near:    Bilateral Near:     Physical Exam Vitals and nursing note reviewed.  Constitutional:      General: She is not in acute distress.    Appearance: She is not toxic-appearing.  Eyes:     Comments: Pupils unequal at baseline per caregiver's report due to eye surgery  Pulmonary:     Effort: Pulmonary effort is normal. No respiratory distress.  Abdominal:     General: Abdomen is flat. Bowel sounds are normal. There is no distension.     Palpations: Abdomen is soft. There is no mass.     Tenderness: There is no abdominal tenderness. There is no right CVA tenderness, left CVA tenderness or guarding.  Skin:    General: Skin is warm  and  dry.     Coloration: Skin is not jaundiced or pale.     Findings: No erythema.  Neurological:     Mental Status: She is alert and oriented to person, place, and time.     Motor: No weakness.     Gait: Gait normal.  Psychiatric:        Behavior: Behavior is cooperative.      UC Treatments / Results  Labs (all labs ordered are listed, but only abnormal results are displayed) Labs Reviewed  POCT URINALYSIS DIP (MANUAL ENTRY) - Abnormal; Notable for the following components:      Result Value   Color, UA straw (*)    Bilirubin, UA small (*)    Ketones, POC UA trace (5) (*)    Spec Grav, UA >=1.030 (*)    Blood, UA trace-intact (*)    Protein Ur, POC trace (*)    Nitrite, UA Positive (*)    Leukocytes, UA Trace (*)    All other components within normal limits  URINE CULTURE    EKG   Radiology  Procedures Procedures (including critical care time)  Medications Ordered in UC Medications - No data to display  Initial Impression / Assessment and Plan / UC Course  I have reviewed the triage vital signs and the nursing notes.  Pertinent labs & imaging results that were available during my care of the patient were reviewed by me and considered in my medical decision making (see chart for details).   Patient is well-appearing, normotensive, afebrile, not tachycardic, not tachypneic, oxygenating well on room air.    1. Acute cystitis with hematuria Overall, vitals and exam are reassuring Urinalysis today positive for trace intact blood, positive nitrites, trace leukocyte Estrace Given allergy to penicillin, will avoid cephalosporins and prescribe Bactrim DS twice daily for 5 days Creatinine clearance calculated 30.34 with most recent creatinine 0.99; no renal dosing needed Urine culture pending Strict ER precautions discussed with patient and caregiver in the meantime, recommended increasing fluid intake  The patient was given the opportunity to ask questions.  All questions  answered to their satisfaction.  The patient is in agreement to this plan.    Final Clinical Impressions(s) / UC Diagnoses   Final diagnoses:  Acute cystitis with hematuria     Discharge Instructions      You have a urinary tract infection.  Take the Bactrim as prescribed to treat it.  Please increase water intake to at least 24 ounces daily.  Seek care if symptoms do not improve with treatment.  We will contact you later this week if the urine culture shows we need to change antibiotic.     ED Prescriptions     Medication Sig Dispense Auth. Provider   sulfamethoxazole-trimethoprim (BACTRIM DS) 800-160 MG tablet Take 1 tablet by mouth every 12 (twelve) hours for 5 days. 10 tablet Valentino Nose, NP      PDMP not reviewed this encounter.   Valentino Nose, NP 04/18/23 [phone]

## 2023-04-18 NOTE — Discharge Instructions (Addendum)
You have a urinary tract infection.  Take the Bactrim as prescribed to treat it.  Please increase water intake to at least 24 ounces daily.  Seek care if symptoms do not improve with treatment.  We will contact you later this week if the urine culture shows we need to change antibiotic.

## 2023-04-18 NOTE — ED Triage Notes (Signed)
Pt states she is having discomfort when urinating with light blood when wipe, caretaker stated her urine was dark and had a odor today, pt daughter states she is having some confusion that is not normal for her.

## 2023-04-18 NOTE — Telephone Encounter (Signed)
Per Dr. Magnus Ivan want pt to see Dr. Christell Constant as a new pt within this week or next week for a eval on c-spine.

## 2023-04-18 NOTE — Progress Notes (Signed)
The patient is a 89 year old patient of ours who had a mechanical fall back on 19 October which was just over 2 weeks ago.  She was seen at outlying hospital.  She is found to have a left distal radius fracture and was placed in a splint.  She was also found to have a cervical spine fracture with a lateral mass of C2 and C7 fracture but nondisplaced.  These were only seen on the CT scan.  The neurosurgeon on-call at the time recommended a CTA which was negative.  She is in a [place] J collar now currently.  She is unable to get in with that neurosurgeon or any of the other neurosurgeons in spite of a referral.  She denies any numbness and tingling in her upper extremities and does report some right elbow pain that is feeling much better today.  She has been having left wrist pain from her fracture.  On exam her cervical collar is fitting nicely.  Her right elbow moves smoothly and fluidly with no pain at all and no bruising and swelling.  Her left wrist surprisingly shows no bruising but does have pain throughout the arc of motion.  X-rays of the left wrist show a nondisplaced distal radius fracture with a small intra-articular component.  X-rays of the right elbow are negative for any type of injury.  X-rays of the cervical spine show no gross findings in terms of malalignment.  There is significant arthritic changes about the cervical spine.  I did pull up the CT scan of her cervical spine showing the nondisplaced lateral mass fractures.  Again the CTA was negative for any vascular injury.  At this point since they are having a hard time getting in with [place] [facility] will see if Dr. Christell Constant can at least evaluate her cervical spine and give some indication is when she can transition from a hard collar to a soft collar.  From my standpoint I will put her in a Velcro wrist splint for her left wrist and would like to see her back in 2 weeks with a repeat 2 views of her left wrist.  Her family agrees to the  treatment plan as well.

## 2023-04-19 ENCOUNTER — Ambulatory Visit (HOSPITAL_BASED_OUTPATIENT_CLINIC_OR_DEPARTMENT_OTHER): Payer: Medicare Other | Admitting: Family Medicine

## 2023-04-20 ENCOUNTER — Encounter: Payer: Self-pay | Admitting: Orthopedic Surgery

## 2023-04-20 ENCOUNTER — Ambulatory Visit (INDEPENDENT_AMBULATORY_CARE_PROVIDER_SITE_OTHER): Payer: Medicare Other | Admitting: Family Medicine

## 2023-04-20 ENCOUNTER — Encounter (HOSPITAL_BASED_OUTPATIENT_CLINIC_OR_DEPARTMENT_OTHER): Payer: Self-pay | Admitting: Family Medicine

## 2023-04-20 ENCOUNTER — Ambulatory Visit (INDEPENDENT_AMBULATORY_CARE_PROVIDER_SITE_OTHER): Payer: Medicare Other | Admitting: Orthopedic Surgery

## 2023-04-20 VITALS — BP 148/82 | HR 86 | Ht 61.0 in | Wt 110.0 lb

## 2023-04-20 DIAGNOSIS — F320 Major depressive disorder, single episode, mild: Secondary | ICD-10-CM | POA: Diagnosis not present

## 2023-04-20 DIAGNOSIS — Z23 Encounter for immunization: Secondary | ICD-10-CM

## 2023-04-20 DIAGNOSIS — G4700 Insomnia, unspecified: Secondary | ICD-10-CM

## 2023-04-20 DIAGNOSIS — W19XXXA Unspecified fall, initial encounter: Secondary | ICD-10-CM | POA: Diagnosis not present

## 2023-04-20 DIAGNOSIS — S12100A Unspecified displaced fracture of second cervical vertebra, initial encounter for closed fracture: Secondary | ICD-10-CM | POA: Insufficient documentation

## 2023-04-20 DIAGNOSIS — S12190A Other displaced fracture of second cervical vertebra, initial encounter for closed fracture: Secondary | ICD-10-CM | POA: Diagnosis not present

## 2023-04-20 DIAGNOSIS — R4189 Other symptoms and signs involving cognitive functions and awareness: Secondary | ICD-10-CM | POA: Insufficient documentation

## 2023-04-20 HISTORY — DX: Unspecified displaced fracture of second cervical vertebra, initial encounter for closed fracture: S12.100A

## 2023-04-20 LAB — URINE CULTURE: Culture: >=100000 — AB

## 2023-04-20 NOTE — Progress Notes (Signed)
Procedures performed today:    None.  Independent interpretation of notes and tests performed by another provider:   None.  Brief History, Exam, Impression, and Recommendations:    BP 136/84 (BP Location: Right Arm, Patient Position: Sitting, Cuff Size: Normal)   Pulse 83   Ht 5\' 1"  (1.549 m)   Wt 107 lb 4.8 oz (48.7 kg)   SpO2 98%   BMI 20.27 kg/m   Encounter for immunization -     Flu Vaccine Trivalent High Dose (Fluad)  Fall, initial encounter  Other closed displaced fracture of second cervical vertebra, initial encounter [facility]) Assessment & Plan: Patient brought in to the office today by her daughter primarily related to recent fall with fractures noted.  On imaging, patient was found C2 cervical vertebrae fracture with displacement noted as well as C7 cervical fracture that was nondisplaced.  Additionally, patient did have left wrist fracture.  She has had evaluation with spine specialist with recommendation for nonoperative management.  Is currently wearing cervical collar for management.  Doctors primary concerns related to daily assistance for patient.  They report that they currently have home health caretakers assisting patient at home, however they have various concerns related to this.  They report that some of the caretakers will not be as attentive to patient's need and with his left side and safety concerns.  Daughter indicates that she was surprised that at time of emergency department evaluation and discharge, patient was sent home as opposed to being admitted or going to alternative facility for further monitoring and management.  Daughter feels that patient requires more intensive monitoring and care and thus thinks that she needs to be admitted into skilled nursing facility for this.  She indicates that she has reached out to facilities that have informed her that she would need to have [place] 2 form [place] in order to have insurance cover stay at facility.  Thus,  she was requesting to have this form filled out. On exam, patient is in no acute distress.  She is wearing cervical collar at this time. Reviewed family request in office today.  Discussed that we can look to complete [place] 2 form for patient for consideration of placement into rehab facility.  Ultimately, unsure of potential approval/coverage given that patient was discharged to home following recent emergency department evaluation. Additionally, daughter is requesting for stronger medication to assist with any breakthrough pain for patient.  Reviewed recent note from patient's evaluation with surgical specialist and indications in their note are for utilization of Tylenol to assist with pain.  No recommendations given regarding need for narcotic medication.  Given high risk with this class of medication and patient's age, would recommend avoidance at this time.  Daughter did insist on having alternative pain medication available for any breakthrough pain.  I recommended that if having notable pain, would reach out to orthopedic surgeons office for advisement on options that they would suggest for pain control.   Insomnia, unspecified type Assessment & Plan: Additionally, daughter is requesting for medication related to insomnia.  We again reviewed high risk with this class of medication in older patients.  In particular, patient has already had issues with multiple falls and being on sleep medication would further increase this risk.  Consideration for treatment related to insomnia does include CBT therapy.  Daughter continues to be insistent on need for medication to help with sleep.  In order to pursue this further, recommend referral to psychiatry to further discuss role  of pharmacotherapy and their recommendations  Orders: -     Ambulatory referral to Psychiatry  Current mild episode of major depressive disorder, unspecified whether recurrent (HCC) -     Ambulatory referral to  Psychiatry  Cognitive changes Assessment & Plan: Daughter requesting referral to neurology for formal cognitive assessment due to considering related to cognitive changes that daughter has been noticing.  Referral to neurology placed today  Orders: -     Ambulatory referral to Neurology  Return in about 3 months (around 07/21/2023).  Spent 76 minutes on this patient encounter, including preparation, chart review, face-to-face counseling with patient and coordination of care, and documentation of encounter   ___________________________________________ Michaela Batrez de [place], MD, ABFM, [facility]

## 2023-04-20 NOTE — Patient Instructions (Signed)
  Medication Instructions:  Your physician recommends that you continue on your current medications as directed. Please refer to the Current Medication list given to you today. --If you need a refill on any your medications before your next appointment, please call your pharmacy first. If no refills are authorized on file call the office.-- Lab Work: Your physician has recommended that you have lab work today: No If you have labs (blood work) drawn today and your tests are completely normal, you will receive your results via MyChart message OR a phone call from our staff.  Please ensure you check your voicemail in the event that you authorized detailed messages to be left on a delegated number. If you have any lab test that is abnormal or we need to change your treatment, we will call you to review the results.     Follow-Up: Your next appointment:   Your physician recommends that you schedule a follow-up appointment in: 3 mos with Dr. de Peru  You will receive a text message or e-mail with a link to a survey about your care and experience with [place] today! We would greatly appreciate your feedback!   Thanks for letting us be apart of your health journey!!  Primary Care and Sports Medicine   Dr. Ceasar Mons Peru   We encourage you to activate your patient portal called "MyChart".  Sign up information is provided on this After Visit Summary.  MyChart is used to connect with patients for Virtual Visits (Telemedicine).  Patients are able to view lab/test results, encounter notes, upcoming appointments, etc.  Non-urgent messages can be sent to your provider as well. To learn more about what you can do with MyChart, please visit --  [email or web address].

## 2023-04-20 NOTE — Progress Notes (Signed)
Orthopedic Spine Surgery Office Note  Assessment: Patient is a 89 y.o. female with C2 lateral mass fracture with intra-articular extension that is minimally displaced.  Also has right arm pain that seems consistent with radiculopathy.   Plan: -Will plan for continued monitoring of the fracture -Patient should remain in a cervical collar for a total of 3 months -Can use Tylenol for pain control -No bending/lifting/twisting greater than 10 pounds -For radiating arm pain, it seems consistent with cervical radiculopathy.  She has multiple levels with degenerative changes that could cause stenosis.  Since this is currently tolerable, we will hold off on any advanced imaging to work it up further -Patient should return to office in 4 weeks, x-rays at next visit: [facility]/lateral/open-mouth cervical x-rays   Patient expressed understanding of the plan and all questions were answered to the patient's satisfaction.   ___________________________________________________________________________   History:  Patient is a 89 y.o. female who presents today for cervical spine.  Patient states that she had a fall at her home on 04/06/2023.  She had neck pain at the time and eventually presented to the ER on 04/09/2023.  Neurosurgery was consulted and recommended hard cervical collar.  Patient was unable to get into their office so she presents to today to my office.  She is not having any neck pain, but does have some discomfort from the cervical collar.  She does have some pain in her right upper extremity.  She says it goes from her right shoulder area to the right elbow.  She feels it with activity and rest.  Today this pain is not so bad.  She does not have any pain rating into her left upper extremity.    Past medical history: Aortic stenosis Chronic constipation Depression HTN Osteoporosis PVD  Allergies: Penicillin, codeine  Past surgical history:  Cataract surgery Left hip pinning Right  intertrochanteric rodding Left THA Aortic valve replacement Peripheral vascular intervention  Social history: Denies use of nicotine product (smoking, vaping, patches, smokeless) Alcohol use: Denies Denies recreational drug use   Physical Exam:  BMI of 20.8  General: no acute distress, appears stated age Neurologic: alert, answering questions appropriately, following commands Respiratory: unlabored breathing on room air, symmetric chest rise Psychiatric: appropriate affect, normal cadence to speech   MSK (spine):  -Strength exam      Left  Right Grip strength                5/5  5/5 Interosseus   5/5   5/5 Wrist extension  5/5  5/5 Wrist flexion   5/5  5/5 Elbow flexion   5/5  5/5 Deltoid    5/5  5/5  -Sensory exam     Sensation intact to light touch in C5-T1 nerve distributions of bilateral upper extremities  -Brachioradialis DTR: 2/4 on the left, 2/4 on the right -Biceps DTR: 2/4 on the left, 2/4 on the right  -Hoffman sign: Negative bilaterally -Clonus: No beats bilaterally -Interosseous wasting: None seen -Grip and release test: Negative -Gait: Slow but normal without assistive devices   Imaging: XRs of the cervical spine from 04/18/2023 was independently reviewed and interpreted, showing degenerative changes throughout the cervical spine.  There is disc height loss and anterior osteophyte formation at every level.  There is also facet arthrosis throughout the cervical spine but is most prominent at C2/3.  Positive cervical sagittal balance.  No fracture or dislocation seen plain films.  CT of the cervical spine from 04/09/2023 was independently reviewed and interpreted,  showing C2 lateral mass fracture with intra-articular extension at the C1/2 joint.  Fracture is minimally displaced.  Nondisplaced right C7/T1 facet fracture.   Patient name: Michaela Simpson Patient MRN: [id number] Date of visit: 04/20/23

## 2023-04-20 NOTE — Telephone Encounter (Signed)
Scheduled for 04/20/23 @ 930

## 2023-04-21 ENCOUNTER — Encounter (HOSPITAL_BASED_OUTPATIENT_CLINIC_OR_DEPARTMENT_OTHER): Payer: Self-pay | Admitting: Family Medicine

## 2023-04-21 ENCOUNTER — Telehealth (HOSPITAL_BASED_OUTPATIENT_CLINIC_OR_DEPARTMENT_OTHER): Payer: Self-pay | Admitting: Family Medicine

## 2023-04-21 NOTE — Telephone Encounter (Signed)
Dawn called regarding the FL2 form  Please fax to [phone]  Please call when ready

## 2023-04-21 NOTE — Telephone Encounter (Signed)
Dawnn Called and made aware form faxed could pick up copy at front desk

## 2023-04-21 NOTE — Telephone Encounter (Signed)
Message sent to betsy (front staff) to call [facility] and let her know this has been faxed and she can pick up copy at front desk

## 2023-04-22 ENCOUNTER — Other Ambulatory Visit: Payer: Self-pay

## 2023-04-22 DIAGNOSIS — R278 Other lack of coordination: Secondary | ICD-10-CM | POA: Diagnosis not present

## 2023-04-22 DIAGNOSIS — S52502S Unspecified fracture of the lower end of left radius, sequela: Secondary | ICD-10-CM | POA: Diagnosis not present

## 2023-04-22 DIAGNOSIS — S12690A Other displaced fracture of seventh cervical vertebra, initial encounter for closed fracture: Secondary | ICD-10-CM | POA: Diagnosis not present

## 2023-04-22 DIAGNOSIS — I739 Peripheral vascular disease, unspecified: Secondary | ICD-10-CM | POA: Diagnosis not present

## 2023-04-22 DIAGNOSIS — Z9181 History of falling: Secondary | ICD-10-CM | POA: Diagnosis not present

## 2023-04-22 DIAGNOSIS — R4189 Other symptoms and signs involving cognitive functions and awareness: Secondary | ICD-10-CM | POA: Diagnosis not present

## 2023-04-22 DIAGNOSIS — E785 Hyperlipidemia, unspecified: Secondary | ICD-10-CM | POA: Diagnosis not present

## 2023-04-22 DIAGNOSIS — S12601D Unspecified nondisplaced fracture of seventh cervical vertebra, subsequent encounter for fracture with routine healing: Secondary | ICD-10-CM | POA: Diagnosis not present

## 2023-04-22 DIAGNOSIS — R296 Repeated falls: Secondary | ICD-10-CM | POA: Diagnosis not present

## 2023-04-22 DIAGNOSIS — S12100D Unspecified displaced fracture of second cervical vertebra, subsequent encounter for fracture with routine healing: Secondary | ICD-10-CM | POA: Diagnosis not present

## 2023-04-22 DIAGNOSIS — I10 Essential (primary) hypertension: Secondary | ICD-10-CM | POA: Diagnosis not present

## 2023-04-22 DIAGNOSIS — I7121 Aneurysm of the ascending aorta, without rupture: Secondary | ICD-10-CM | POA: Diagnosis not present

## 2023-04-22 DIAGNOSIS — E559 Vitamin D deficiency, unspecified: Secondary | ICD-10-CM | POA: Diagnosis not present

## 2023-04-22 DIAGNOSIS — D513 Other dietary vitamin B12 deficiency anemia: Secondary | ICD-10-CM | POA: Diagnosis not present

## 2023-04-22 DIAGNOSIS — Z741 Need for assistance with personal care: Secondary | ICD-10-CM | POA: Diagnosis not present

## 2023-04-22 DIAGNOSIS — I700 Atherosclerosis of aorta: Secondary | ICD-10-CM | POA: Diagnosis not present

## 2023-04-22 DIAGNOSIS — R262 Difficulty in walking, not elsewhere classified: Secondary | ICD-10-CM | POA: Diagnosis not present

## 2023-04-22 DIAGNOSIS — M6281 Muscle weakness (generalized): Secondary | ICD-10-CM | POA: Diagnosis not present

## 2023-04-22 DIAGNOSIS — M069 Rheumatoid arthritis, unspecified: Secondary | ICD-10-CM | POA: Diagnosis not present

## 2023-04-22 DIAGNOSIS — R488 Other symbolic dysfunctions: Secondary | ICD-10-CM | POA: Diagnosis not present

## 2023-04-22 DIAGNOSIS — S12190S Other displaced fracture of second cervical vertebra, sequela: Secondary | ICD-10-CM | POA: Diagnosis not present

## 2023-04-22 DIAGNOSIS — M810 Age-related osteoporosis without current pathological fracture: Secondary | ICD-10-CM | POA: Diagnosis not present

## 2023-04-22 DIAGNOSIS — S52532D Colles' fracture of left radius, subsequent encounter for closed fracture with routine healing: Secondary | ICD-10-CM | POA: Diagnosis not present

## 2023-04-22 DIAGNOSIS — F339 Major depressive disorder, recurrent, unspecified: Secondary | ICD-10-CM | POA: Diagnosis not present

## 2023-04-22 DIAGNOSIS — E43 Unspecified severe protein-calorie malnutrition: Secondary | ICD-10-CM | POA: Diagnosis not present

## 2023-04-25 ENCOUNTER — Other Ambulatory Visit (HOSPITAL_COMMUNITY)
Admission: RE | Admit: 2023-04-25 | Discharge: 2023-04-25 | Disposition: A | Discharge status: Home / Self Care | Payer: Medicare Other | Source: Skilled Nursing Facility | Attending: Adult Health | Admitting: Adult Health

## 2023-04-25 ENCOUNTER — Non-Acute Institutional Stay (SKILLED_NURSING_FACILITY): Payer: Medicare Other | Admitting: Adult Health

## 2023-04-25 ENCOUNTER — Ambulatory Visit (HOSPITAL_BASED_OUTPATIENT_CLINIC_OR_DEPARTMENT_OTHER): Payer: Medicare Other | Admitting: Family Medicine

## 2023-04-25 ENCOUNTER — Encounter: Payer: Self-pay | Admitting: Adult Health

## 2023-04-25 DIAGNOSIS — S12690A Other displaced fracture of seventh cervical vertebra, initial encounter for closed fracture: Secondary | ICD-10-CM | POA: Diagnosis not present

## 2023-04-25 DIAGNOSIS — F339 Major depressive disorder, recurrent, unspecified: Secondary | ICD-10-CM

## 2023-04-25 DIAGNOSIS — S12190S Other displaced fracture of second cervical vertebra, sequela: Secondary | ICD-10-CM | POA: Diagnosis not present

## 2023-04-25 DIAGNOSIS — E43 Unspecified severe protein-calorie malnutrition: Secondary | ICD-10-CM

## 2023-04-25 DIAGNOSIS — R4189 Other symptoms and signs involving cognitive functions and awareness: Secondary | ICD-10-CM | POA: Diagnosis not present

## 2023-04-25 DIAGNOSIS — I700 Atherosclerosis of aorta: Secondary | ICD-10-CM

## 2023-04-25 DIAGNOSIS — I10 Essential (primary) hypertension: Secondary | ICD-10-CM | POA: Diagnosis not present

## 2023-04-25 DIAGNOSIS — N390 Urinary tract infection, site not specified: Secondary | ICD-10-CM | POA: Insufficient documentation

## 2023-04-25 DIAGNOSIS — I7121 Aneurysm of the ascending aorta, without rupture: Secondary | ICD-10-CM | POA: Diagnosis not present

## 2023-04-25 DIAGNOSIS — D513 Other dietary vitamin B12 deficiency anemia: Secondary | ICD-10-CM | POA: Insufficient documentation

## 2023-04-25 DIAGNOSIS — M069 Rheumatoid arthritis, unspecified: Secondary | ICD-10-CM | POA: Diagnosis not present

## 2023-04-25 DIAGNOSIS — S52502S Unspecified fracture of the lower end of left radius, sequela: Secondary | ICD-10-CM

## 2023-04-25 DIAGNOSIS — E785 Hyperlipidemia, unspecified: Secondary | ICD-10-CM | POA: Insufficient documentation

## 2023-04-25 DIAGNOSIS — E559 Vitamin D deficiency, unspecified: Secondary | ICD-10-CM | POA: Insufficient documentation

## 2023-04-25 DIAGNOSIS — I739 Peripheral vascular disease, unspecified: Secondary | ICD-10-CM | POA: Diagnosis not present

## 2023-04-25 LAB — CBC WITH DIFFERENTIAL/PLATELET
Abs Immature Granulocytes: 0.02 10*3/uL (ref 0.00–0.07)
Basophils Absolute: 0.1 10*3/uL (ref 0.0–0.1)
Basophils Relative: 1 %
Eosinophils Absolute: 0 10*3/uL (ref 0.0–0.5)
Eosinophils Relative: 0 %
HCT: 37.5 % (ref 36.0–46.0)
Hemoglobin: 11.7 g/dL — ABNORMAL LOW (ref 12.0–15.0)
Immature Granulocytes: 0 %
Lymphocytes Relative: 11 %
Lymphs Abs: 0.8 10*3/uL (ref 0.7–4.0)
MCH: 30 pg (ref 26.0–34.0)
MCHC: 31.2 g/dL (ref 30.0–36.0)
MCV: 96.2 fL (ref 80.0–100.0)
Monocytes Absolute: 0.4 10*3/uL (ref 0.1–1.0)
Monocytes Relative: 6 %
Neutro Abs: 6 10*3/uL (ref 1.7–7.7)
Neutrophils Relative %: 82 %
Platelets: 226 10*3/uL (ref 150–400)
RBC: 3.9 MIL/uL (ref 3.87–5.11)
RDW: 15.7 % — ABNORMAL HIGH (ref 11.5–15.5)
WBC: 7.3 10*3/uL (ref 4.0–10.5)
nRBC: 0 % (ref 0.0–0.2)

## 2023-04-25 LAB — VITAMIN D 25 HYDROXY (VIT D DEFICIENCY, FRACTURES): Vit D, 25-Hydroxy: 98.65 ng/mL (ref 30–100)

## 2023-04-25 LAB — COMPREHENSIVE METABOLIC PANEL
ALT: 14 U/L (ref 0–44)
AST: 18 U/L (ref 15–41)
Albumin: 4.2 g/dL (ref 3.5–5.0)
Alkaline Phosphatase: 114 U/L (ref 38–126)
Anion gap: 10 (ref 5–15)
BUN: 38 mg/dL — ABNORMAL HIGH (ref 8–23)
CO2: 21 mmol/L — ABNORMAL LOW (ref 22–32)
Calcium: 9.2 mg/dL (ref 8.9–10.3)
Chloride: 105 mmol/L (ref 98–111)
Creatinine, Ser: 1.44 mg/dL — ABNORMAL HIGH (ref 0.44–1.00)
GFR, Estimated: 35 mL/min — ABNORMAL LOW (ref >60)
Glucose, Bld: 119 mg/dL — ABNORMAL HIGH (ref 70–99)
Potassium: 4.5 mmol/L (ref 3.5–5.1)
Sodium: 136 mmol/L (ref 135–145)
Total Bilirubin: 0.5 mg/dL (ref <1.2)
Total Protein: 6.6 g/dL (ref 6.5–8.1)

## 2023-04-25 LAB — LIPID PANEL
Cholesterol: 134 mg/dL (ref 0–200)
HDL: 65 mg/dL (ref >40)
LDL Cholesterol: 34 mg/dL (ref 0–99)
Total CHOL/HDL Ratio: 2.1 {ratio}
Triglycerides: 176 mg/dL — ABNORMAL HIGH (ref <150)
VLDL: 35 mg/dL (ref 0–40)

## 2023-04-25 LAB — VITAMIN B12: Vitamin B-12: 906 pg/mL (ref 180–914)

## 2023-04-25 NOTE — Progress Notes (Unsigned)
Location:  [facility] Room Number: [id number] Place of Service:  SNF ([id number])   CODE STATUS: dnr   Allergies  Allergen Reactions   Penicillins Nausea And Vomiting and Other (See Comments)    Child hood allergy - Flu symptoms; constant vomiting  Did it involve swelling of the face/tongue/throat, SOB, or low BP? Unknown Did it involve sudden or severe rash/hives, skin peeling, or any reaction on the inside of your mouth or nose? Unknown Did you need to seek medical attention at a hospital or doctor's office? Unknown When did it last happen?     50+ years If all above answers are "NO", may proceed with cephalosporin use.    Codeine Nausea And Vomiting and Rash    INTOLERANCE >  VOMITING    Chief Complaint  Patient presents with   Hospitalization Follow-up    HPI:  She is a 89 year old woman who has had a recent left radial fracture, C2 and C7 fractures on 04-09-23; status post fall on concrete. She presented to the ED for UTI and was taken back home. She is here at this  time for short term rehab. Her goal at this time is to return back home. She will continue to be followed for her chronic illnesses including: Rheumatoid arthritis involving multiple sites unspecified whether rheumatoid factor present.    Major depression recurrent chronic:    Protein calorie malnutrition severe:  Cognitive changes:     Past Medical History:  Diagnosis Date   Acute blood loss anemia 03/22/2015   Acute renal failure syndrome (HCC)    AKI (acute kidney injury) (HCC)    Anemia    Aortic stenosis    Bilateral carpal tunnel syndrome 05/15/2018   Bruising    large bruise to left neck and T/O body   Chronic constipation 03/15/2019   Closed left humeral fracture 03/08/2019   Closed right hip fracture (HCC) 03/14/2015   Depression    Early cataract    Fracture of femoral neck, left, closed (HCC) 03/08/2019   Headache    Hip fracture (HCC)    HTN (hypertension)    Humerus fracture     Leukocytosis    Major depression, recurrent, chronic (HCC) 03/15/2019   Osteonecrosis (HCC)    post-traumatic osteonecrosis of the femoral head of the left hip, retained orthopedic hardware ( order for conset)   Osteoporosis    Peripheral vascular disease (HCC)    blood clot right leg   Protein-calorie malnutrition, severe 09/21/2019   Rheumatoid arthritis (HCC)    S/P TAVR (transcatheter aortic valve replacement) 08/08/2019   Sequelae of open wound of right lower extremity 03/15/2019   Severe aortic stenosis 07/19/2019   Thoracic ascending aortic aneurysm (HCC)    4.4 cm by 07/26/19 CTA   Traumatic arthritis of left hip 10/02/2019   Ulcer of lower extremity (HCC)    Wears glasses     Past Surgical History:  Procedure Laterality Date   ABDOMINAL AORTOGRAM W/LOWER EXTREMITY N/A 05/08/2019   Procedure: ABDOMINAL AORTOGRAM W/LOWER EXTREMITY;  Surgeon: Nada Libman, MD;  Location: [facility] INVASIVE CV LAB;  Service: Cardiovascular;  Laterality: N/A;   CATARACT EXTRACTION W/PHACO Left 10/24/2020   Procedure: CATARACT EXTRACTION PHACO AND INTRAOCULAR LENS PLACEMENT LEFT EYE;  Surgeon: Fabio Pierce, MD;  Location: [facility];  Service: Ophthalmology;  Laterality: Left;  CDE  20.05   CATARACT EXTRACTION W/PHACO Right 11/07/2020   Procedure: CATARACT EXTRACTION PHACO AND INTRAOCULAR LENS PLACEMENT RIGHT EYE;  Surgeon: Fabio Pierce, MD;  Location: [facility];  Service: Ophthalmology;  Laterality: Right;  right, CDE=24.26   HARDWARE REMOVAL Left 10/02/2019   Procedure: HARDWARE REMOVAL;  Surgeon: Kathryne Hitch, MD;  Location: [facility] OR;  Service: Orthopedics;  Laterality: Left;   HIP PINNING,CANNULATED Left 03/09/2019   Procedure: CANNULATED HIP PINNING;  Surgeon: Kathryne Hitch, MD;  Location: [facility] OR;  Service: Orthopedics;  Laterality: Left;   INTRAMEDULLARY (IM) NAIL INTERTROCHANTERIC Right 03/14/2015   Procedure: RIGHT INTERTROCHANTRIC INTRAMEDULLARY (IM) NAIL ;  Surgeon: Kathryne Hitch,  MD;  Location: [facility] OR;  Service: Orthopedics;  Laterality: Right;   PERIPHERAL VASCULAR INTERVENTION Right 05/08/2019   Procedure: PERIPHERAL VASCULAR INTERVENTION;  Surgeon: Nada Libman, MD;  Location: [facility];  Service: Cardiovascular;  Laterality: Right;   RIGHT/LEFT HEART CATH AND CORONARY ANGIOGRAPHY N/A 07/19/2019   Procedure: RIGHT/LEFT HEART CATH AND CORONARY ANGIOGRAPHY;  Surgeon: Tonny Bollman, MD;  Location: [facility] INVASIVE CV LAB;  Service: Cardiovascular;  Laterality: N/A;   TEE WITHOUT CARDIOVERSION N/A 08/07/2019   Procedure: TRANSESOPHAGEAL ECHOCARDIOGRAM (TEE);  Surgeon: Tonny Bollman, MD;  Location: [facility] INVASIVE CV LAB;  Service: Open Heart Surgery;  Laterality: N/A;   TOTAL HIP ARTHROPLASTY Left 10/02/2019   Procedure: LEFT TOTAL HIP ARTHROPLASTY ANTERIOR APPROACH AND REMOVAL OF CANNULATED SCREWS OF LEFT HIP;  Surgeon: Kathryne Hitch, MD;  Location: [facility] OR;  Service: Orthopedics;  Laterality: Left;   TRANSCATHETER AORTIC VALVE REPLACEMENT, TRANSFEMORAL  08/07/2019   TRANSCATHETER AORTIC VALVE REPLACEMENT, TRANSFEMORAL N/A 08/07/2019   Procedure: TRANSCATHETER AORTIC VALVE REPLACEMENT, TRANSFEMORAL;  Surgeon: Tonny Bollman, MD;  Location: [facility] INVASIVE CV LAB;  Service: Open Heart Surgery;  Laterality: N/A;    Social History   Socioeconomic History   Marital status: Widowed    Spouse name: Not on file   Number of children: Not on file   Years of education: Not on file   Highest education level: 12th grade  Occupational History   Occupation: retired  Tobacco Use   Smoking status: Never    Passive exposure: Never   Smokeless tobacco: Never  Vaping Use   Vaping status: Never Used  Substance and Sexual Activity   Alcohol use: No   Drug use: No   Sexual activity: Not on file  Other Topics Concern   Not on file  Social History Narrative   Not on file   Social Determinants of Health   Financial Resource Strain: Low Risk  (04/19/2023)   Overall  Financial Resource Strain (CARDIA)    Difficulty of Paying Living Expenses: Not hard at all  Food Insecurity: No Food Insecurity (04/19/2023)   Hunger Vital Sign    Worried About Running Out of Food in the Last Year: Never true    Ran Out of Food in the Last Year: Never true  Transportation Needs: No Transportation Needs (04/19/2023)   PRAPARE - Administrator, Civil Service (Medical): No    Lack of Transportation (Non-Medical): No  Physical Activity: Unknown (04/19/2023)   Exercise Vital Sign    Days of Exercise per Week: Patient declined    Minutes of Exercise per Session: 20 min  Stress: Stress Concern Present (04/19/2023)   [employer] of Occupational Health - Occupational Stress Questionnaire    Feeling of Stress : Very much  Social Connections: Moderately Integrated (04/19/2023)   Social Connection and Isolation Panel [NHANES]    Frequency of Communication with Friends and Family: Three times a week  Frequency of Social Gatherings with Friends and Family: Once a week    Attends Religious Services: More than 4 times per year    Active Member of [employer] or Organizations: Yes    Attends Banker Meetings: More than 4 times per year    Marital Status: Widowed  Intimate Partner Violence: Not At Risk (07/27/2022)   Humiliation, Afraid, Rape, and Kick questionnaire    Fear of Current or Ex-Partner: No    Emotionally Abused: No    Physically Abused: No    Sexually Abused: No   Family History  Problem Relation Age of Onset   Cancer Sister       VITAL SIGNS BP (!) 131/50   Pulse 67   Temp 98.4 F (36.9 C)   Resp 18   Ht 5\' 2"  (1.575 m)   Wt 105 lb 4.8 oz (47.8 kg)   SpO2 99%   BMI 19.26 kg/m   Outpatient Encounter Medications as of 04/25/2023  Medication Sig   acetaminophen (TYLENOL) 325 MG tablet Take 2 tablets (650 mg total) by mouth every 6 (six) hours as needed for mild pain (or Fever >/= 101).   amLODipine (NORVASC) 5 MG tablet TAKE 1  TABLET BY MOUTH DAILY.   azithromycin (ZITHROMAX) 500 MG tablet Take 1 tablet (500 mg) one hour prior to all dental visits.   Boswellia-Glucosamine-Vit D (OSTEO BI-FLEX ONE PER DAY PO) Take 1 tablet by mouth daily.   Cholecalciferol (VITAMIN D-3) 125 MCG (5000 UT) TABS Take 1 tablet by mouth daily.   clopidogrel (PLAVIX) 75 MG tablet TAKE 1 TABLET(75 MG) BY MOUTH DAILY   Lactobacillus (PROBIOTIC ACIDOPHILUS PO) Take 1 tablet by mouth daily.   magnesium oxide (MAG-OX) 400 (240 Mg) MG tablet TAKE 1 TABLET(400 MG) BY MOUTH DAILY   Menatetrenone (VITAMIN K2) 100 MCG TABS Take 100 mcg by mouth daily.   rosuvastatin (CRESTOR) 10 MG tablet TAKE 1 TABLET(10 MG) BY MOUTH DAILY   sertraline (ZOLOFT) 100 MG tablet Take 1.5 tablets (150 mg total) by mouth daily.   traMADol (ULTRAM) 50 MG tablet Take 1 tablet (50 mg total) by mouth every 6 (six) hours as needed. (Patient not taking: Reported on 03/28/2023)   vitamin B-12 (CYANOCOBALAMIN) 1000 MCG tablet Take 1,000 mcg by mouth daily.   No facility-administered encounter medications on file as of 04/25/2023.     SIGNIFICANT DIAGNOSTIC EXAMS  TODAY  04-25-23: wbc 7.3; hgb 11.7; hct 37.5; mcv 96.2 plt 226; glucose 119; bun 38; creat 1.44; k+ 4.5; na++ 136; ca 9.2 gfr 35; protein 6.6; albumin 4.2; vitamin D 98.65 chol 134; ldl 34; trig 176; hdl 64   Review of Systems  Constitutional:  Negative for malaise/fatigue.  Respiratory:  Negative for cough and shortness of breath.   Cardiovascular:  Negative for chest pain, palpitations and leg swelling.  Gastrointestinal:  Negative for abdominal pain, constipation and heartburn.  Musculoskeletal:  Negative for back pain, joint pain and myalgias.  Skin: Negative.   Neurological:  Negative for dizziness.  Psychiatric/Behavioral:  The patient is not nervous/anxious.    Physical Exam Constitutional:      General: She is not in acute distress.    Appearance: She is well-developed. She is not diaphoretic.  Eyes:      Comments: Right pupil >left pupil   Neck:     Thyroid: No thyromegaly.  Cardiovascular:     Rate and Rhythm: Normal rate and regular rhythm.     Pulses: Normal pulses.  Heart sounds: Normal heart sounds.  Pulmonary:     Effort: Pulmonary effort is normal. No respiratory distress.     Breath sounds: Normal breath sounds.  Abdominal:     General: Bowel sounds are normal. There is no distension.     Palpations: Abdomen is soft.     Tenderness: There is no abdominal tenderness.  Musculoskeletal:        General: Normal range of motion.     Cervical back: Neck supple.     Right lower leg: No edema.     Left lower leg: No edema.     Comments: C-collar in place  Left radius brace   Lymphadenopathy:     Cervical: No cervical adenopathy.  Skin:    General: Skin is warm and dry.  Neurological:     Mental Status: She is alert. Mental status is at baseline.  Psychiatric:        Mood and Affect: Mood normal.       ASSESSMENT/ PLAN:  TODAY  Aortic atherosclerosis: (ct 09-19-19): will stop statin due to advanced age  38. Essential benign hypertension: b/p 131/50: will continue norvasc 5 mg daily   3.  Other closed displaced fracture of second cervical vertebrae sequela: / other closed fracture of seventh cervical vertebrae/ closed fracture of distal end left radius unspecified fracture morphology sequela: will continue therapy as directed to improve upon her level of independence with her adls. Will follow up as indicated   4. Rheumatoid arthritis involving multiple sites unspecified whether rheumatoid factor present. Will continue to monitor   5. Major depression recurrent chronic: is stable will continue zoloft 150 mg daily   6. Protein calorie malnutrition severe: albumin 4.2; will continue supplements as directed  7. Cognitive changes: will await ST cognition testing  8. Hypomagnesemia: will continue mag ox 400 mg daily  9. PVD (peripheral vascular disease) is status  post stenting for an ulceration; will continue plavix daily    10. Thoracic ascending aortic aneurysm without rupture: per medical history   11. Dyslipidemia: ldl 34; will stop crestor due to advanced age.   Will check bmp; h/h.   Synthia Innocent NP [facility]  call [phone]

## 2023-04-26 ENCOUNTER — Encounter: Payer: Self-pay | Admitting: Internal Medicine

## 2023-04-26 ENCOUNTER — Non-Acute Institutional Stay (SKILLED_NURSING_FACILITY): Payer: Medicare Other | Admitting: Internal Medicine

## 2023-04-26 DIAGNOSIS — I7121 Aneurysm of the ascending aorta, without rupture: Secondary | ICD-10-CM

## 2023-04-26 DIAGNOSIS — S12600A Unspecified displaced fracture of seventh cervical vertebra, initial encounter for closed fracture: Secondary | ICD-10-CM | POA: Insufficient documentation

## 2023-04-26 DIAGNOSIS — R296 Repeated falls: Secondary | ICD-10-CM

## 2023-04-26 DIAGNOSIS — S5292XA Unspecified fracture of left forearm, initial encounter for closed fracture: Secondary | ICD-10-CM | POA: Insufficient documentation

## 2023-04-26 DIAGNOSIS — S12190S Other displaced fracture of second cervical vertebra, sequela: Secondary | ICD-10-CM | POA: Diagnosis not present

## 2023-04-26 DIAGNOSIS — I10 Essential (primary) hypertension: Secondary | ICD-10-CM | POA: Diagnosis not present

## 2023-04-26 DIAGNOSIS — I739 Peripheral vascular disease, unspecified: Secondary | ICD-10-CM

## 2023-04-26 DIAGNOSIS — E43 Unspecified severe protein-calorie malnutrition: Secondary | ICD-10-CM

## 2023-04-26 DIAGNOSIS — F339 Major depressive disorder, recurrent, unspecified: Secondary | ICD-10-CM | POA: Diagnosis not present

## 2023-04-26 DIAGNOSIS — I700 Atherosclerosis of aorta: Secondary | ICD-10-CM | POA: Insufficient documentation

## 2023-04-26 DIAGNOSIS — S12690A Other displaced fracture of seventh cervical vertebra, initial encounter for closed fracture: Secondary | ICD-10-CM | POA: Insufficient documentation

## 2023-04-26 HISTORY — DX: Aneurysm of the ascending aorta, without rupture: I71.21

## 2023-04-26 HISTORY — DX: Peripheral vascular disease, unspecified: I73.9

## 2023-04-26 HISTORY — DX: Unspecified fracture of left forearm, initial encounter for closed fracture: S52.92XA

## 2023-04-26 HISTORY — DX: Repeated falls: R29.6

## 2023-04-26 HISTORY — DX: Hypomagnesemia: E83.42

## 2023-04-26 HISTORY — DX: Unspecified displaced fracture of seventh cervical vertebra, initial encounter for closed fracture: S12.600A

## 2023-04-26 HISTORY — DX: Atherosclerosis of aorta: I70.0

## 2023-04-26 NOTE — Assessment & Plan Note (Signed)
PT/ OT as tolerated with assessment of her ability to return home safely.

## 2023-04-26 NOTE — Assessment & Plan Note (Signed)
Systolic blood pressure is minimally elevated; average will be verified and antihypertensive regimen adjusted appropriately.  At present she remains on amlodipine.

## 2023-04-26 NOTE — Assessment & Plan Note (Signed)
Current albumin is 4.1 and total protein 6.6; but she does not have limb atrophy and interosseous wasting.  Nutritionist will assess at the SNF.

## 2023-04-26 NOTE — Assessment & Plan Note (Signed)
Nonoperative.  Management with cervical collar recommended.  Follow-up with NS as

## 2023-04-26 NOTE — Patient Instructions (Signed)
See assessment and plan under each diagnosis in the problem list and acutely for this visit 

## 2023-04-26 NOTE — Progress Notes (Unsigned)
NURSING HOME LOCATION:  [facility] ROOM NUMBER:  [id number]   CODE STATUS:  Full Code  PCP:   Raymond de Peru MD  This is a comprehensive admission note to this SNFperformed on this date less than 30 days from date of admission. Included are preadmission medical/surgical history; reconciled medication list; family history; social history and comprehensive review of systems.  Corrections and additions to the records were documented. Comprehensive physical exam was also performed. Additionally a clinical summary was entered for each active diagnosis pertinent to this admission in the Problem List to enhance continuity of care.  HPI: She was admitted from home because of failure to thrive.  Apparently 1 week prior to admission to the SNF she was experiencing unsteadiness with recurrent falls and intermittent confusion.  Family member noted foul-smelling urine and patient described traces of blood on the toilet tissue. She was seen in the ED 10/28 and urine culture revealed pansensitive E. coli for which she received Bactrim.   Most recent renal function had revealed a GFR of 55 indicating CKD stage III A.  Albumin was 4.1 and total protein 6.6. On 10/30 she had an office visit with her PCP following a fall in which she sustained a fracture of C2 with displacement and C7 which was nondisplaced.  She also sustained fracture of the left wrist for which brace was recommended.  Neurosurgical consultation resulted in placement of cervical collar and nonoperative management was suggested. An FL 2 form was completed to allow placement in the SNF for rehab prior to possible return home.  Past medical and surgical history: Includes history of AKI with acute renal failure, history of aortic stenosis, chronic depression, essential hypertension, osteoporosis, PVD, rheumatoid arthritis, protein/caloric malnutrition, and history of ascending thoracic aortic aneurysm. Procedures include abdominal  aortogram, vascular intervention, coronary angiography, and transcatheter aortic valve replacement transfemorally.  Family history: reviewed, non contributory due to advanced age.  Social history: Nondrinker, non-smoker.   Review of systems: She denied any definite neurologic or cardiac prodrome prior to the falls.  She described her most recent event as "spun around and fell in the garage."  She went on to give a vague history of having falls intermittently since September 2019. She gave a vague history of interventions for peripheral vascular disease stating "they did something with the machine, blood could not get to the wound."  She describes a chronic right shin wound which would not heal.  She stated they did place "stents in my legs."  She also verified having had cardiac catheterization. She states that her legs "occasionally want to jump, difficulty keeping them still."  She does describe some exertional dyspnea.  She has some chronic constipation.  She verified she had a UTI but denies any active GU symptoms.  When asked about anxiety or depression she stated "I did not want to leave home.  I walk the halls and see people suffering in limbo."  She then mentioned that her son "passed away 02-21-24years ago."  She went on to describe "everything caving in ."  Constitutional: No fever, significant weight change  Eyes: No redness, discharge, pain, vision change ENT/mouth: No nasal congestion, purulent discharge, earache, change in hearing, sore throat  Cardiovascular: No chest pain, palpitations, paroxysmal nocturnal dyspnea, claudication, edema  Respiratory: No cough, sputum production, hemoptysis, significant snoring, apnea Gastrointestinal: No heartburn, dysphagia, abdominal pain, nausea /vomiting, rectal bleeding, melena Genitourinary: No dysuria, hematuria, pyuria, incontinence, nocturia Musculoskeletal: No joint stiffness, joint swelling,  weakness, pain Dermatologic: No rash,  pruritus, change in appearance of skin Neurologic: No dizziness, headache, syncope, seizures, numbness, tingling Psychiatric: No significant insomnia, anorexia Endocrine: No change in hair/skin/nails, excessive thirst, excessive hunger, excessive urination  Hematologic/lymphatic: No significant bruising, lymphadenopathy, abnormal bleeding Allergy/immunology: No itchy/watery eyes, significant sneezing, urticaria, angioedema  Physical exam:  Pertinent or positive findings: Her hair is starkly white and very fine.  Lower lids are puffy.  Anisocoria was present with the right pupil significantly larger than the left.  She is wearing a cervical collar.  There is a slight increase in S1.  Breath sounds are decreased.  Abdomen slightly protuberant.  The left dorsalis pedis pulse is stronger than the other pulses.  She is wearing a brace over the left wrist.  She has hyperpigmentation and an irregular scar over the right lateral shin.  Limb atrophy is present.  She has osteoarthritic changes of the hands and interosseous wasting.  General appearance: no acute distress, increased work of breathing is present.   Lymphatic: No lymphadenopathy about the head, neck, axilla. Eyes: No conjunctival inflammation or lid edema is present. There is no scleral icterus. Ears:  External ear exam shows no significant lesions or deformities.   Nose:  External nasal examination shows no deformity or inflammation. Nasal mucosa are pink and moist without lesions, exudates Oral exam: Lips and gums are healthy appearing.There is no oropharyngeal erythema or exudate. Heart:  Normal rate and regular rhythm. without gallop, murmur, click, rub.  Lungs:  without wheezes, rhonchi, rales, rubs. Abdomen: Bowel sounds are normal.  Abdomen is soft and nontender with no organomegaly, hernias, masses. GU: Deferred  Extremities:  No cyanosis, clubbing, edema. Neurologic exam:  Balance, Rhomberg, finger to nose testing could not be  completed due to clinical state Skin: Warm & dry w/o tenting. No significant rash.  See clinical summary under each active problem in the Problem List with associated updated therapeutic plan

## 2023-04-27 NOTE — Assessment & Plan Note (Signed)
See her comments 04/26/23; clinically chronic, recurrent depression suggested. Continue SSRI & assess need for adjunctive therapy especially if she becomes a permanent resident @ SNF.

## 2023-04-27 NOTE — Assessment & Plan Note (Signed)
Additionally, daughter is requesting for medication related to insomnia.  We again reviewed high risk with this class of medication in older patients.  In particular, patient has already had issues with multiple falls and being on sleep medication would further increase this risk.  Consideration for treatment related to insomnia does include CBT therapy.  Daughter continues to be insistent on need for medication to help with sleep.  In order to pursue this further, recommend referral to psychiatry to further discuss role of pharmacotherapy and their recommendations

## 2023-04-27 NOTE — Assessment & Plan Note (Signed)
Patient brought in to the office today by her daughter primarily related to recent fall with fractures noted.  On imaging, patient was found C2 cervical vertebrae fracture with displacement noted as well as C7 cervical fracture that was nondisplaced.  Additionally, patient did have left wrist fracture.  She has had evaluation with spine specialist with recommendation for nonoperative management.  Is currently wearing cervical collar for management.  Doctors primary concerns related to daily assistance for patient.  They report that they currently have home health caretakers assisting patient at home, however they have various concerns related to this.  They report that some of the caretakers will not be as attentive to patient's need and with his left side and safety concerns.  Daughter indicates that she was surprised that at time of emergency department evaluation and discharge, patient was sent home as opposed to being admitted or going to alternative facility for further monitoring and management.  Daughter feels that patient requires more intensive monitoring and care and thus thinks that she needs to be admitted into skilled nursing facility for this.  She indicates that she has reached out to facilities that have informed her that she would need to have [place] 2 form filled [place] in order to have insurance cover stay at facility.  Thus, she was requesting to have this form filled out. On exam, patient is in no acute distress.  She is wearing cervical collar at this time. Reviewed family request in office today.  Discussed that we can look to complete [place] 2 form for patient for consideration of placement into rehab facility.  Ultimately, unsure of potential approval/coverage given that patient was discharged to home following recent emergency department evaluation. Additionally, daughter is requesting for stronger medication to assist with any breakthrough pain for patient.  Reviewed recent note from patient's  evaluation with surgical specialist and indications in their note are for utilization of Tylenol to assist with pain.  No recommendations given regarding need for narcotic medication.  Given high risk with this class of medication and patient's age, would recommend avoidance at this time.  Daughter did insist on having alternative pain medication available for any breakthrough pain.  I recommended that if having notable pain, would reach out to orthopedic surgeons office for advisement on options that they would suggest for pain control.

## 2023-04-27 NOTE — Assessment & Plan Note (Signed)
Daughter requesting referral to neurology for formal cognitive assessment due to considering related to cognitive changes that daughter has been noticing.  Referral to neurology placed today

## 2023-04-28 ENCOUNTER — Ambulatory Visit: Payer: Medicare Other | Admitting: Orthopaedic Surgery

## 2023-05-02 ENCOUNTER — Encounter: Payer: Self-pay | Admitting: Physician Assistant

## 2023-05-02 ENCOUNTER — Other Ambulatory Visit (HOSPITAL_COMMUNITY)
Admission: RE | Admit: 2023-05-02 | Discharge: 2023-05-02 | Disposition: A | Discharge status: Home / Self Care | Payer: Medicare Other | Source: Skilled Nursing Facility | Attending: Adult Health | Admitting: Adult Health

## 2023-05-02 DIAGNOSIS — I10 Essential (primary) hypertension: Secondary | ICD-10-CM | POA: Insufficient documentation

## 2023-05-09 ENCOUNTER — Other Ambulatory Visit: Payer: Self-pay | Admitting: *Deleted

## 2023-05-09 NOTE — Patient Outreach (Signed)
Post-Acute Care Coordinator follow up. Mrs. Foglia resides in [facility].  Screening for potential chronic care management services as a benefit of health plan and primary care provider.  Update received from [place], [place] Child psychotherapist. Lynnea Ferrier reports family wants Mrs. Carrazana to go ALF. Kerri reports Mrs. Azoulay adamantly wants to return home.   Will continue to follow.   Raiford Noble, MSN, RN, BSN   [facility] RN Post- Acute Care Manager Direct Dial: [phone]

## 2023-05-12 ENCOUNTER — Ambulatory Visit: Payer: Medicare Other | Admitting: Orthopaedic Surgery

## 2023-05-12 ENCOUNTER — Other Ambulatory Visit (INDEPENDENT_AMBULATORY_CARE_PROVIDER_SITE_OTHER): Payer: Medicare Other

## 2023-05-12 DIAGNOSIS — S52532D Colles' fracture of left radius, subsequent encounter for closed fracture with routine healing: Secondary | ICD-10-CM

## 2023-05-12 NOTE — Progress Notes (Signed)
The patient is an 89 year old female who is 5 weeks into a mechanical fall in which she sustained a left distal radius fracture as well as a cervical spine fracture.  My partner Dr. Christell Constant is following her for her cervical spine fracture.  She has been compliant wearing her cervical collar.  She has also been compliant wearing a Velcro wrist splint for her left wrist.  She says her left wrist is doing well and has no issues.  She does ambulate using a cane.  Family is with her today as well.  Examination of her left wrist shows anatomically is a neutral alignment.  She does let me flex and extend her wrist with some pain.  She has decent grip and pinch strength.  X-rays of the left wrist show that the fracture is healing appropriately of her left distal radius.  Given her advanced age and the fact that distal radius fractures in this age group tend to do very well and also due to the fact that she is already doing very well, she can start coming out of the wrist brace as comfort allows and only wear it if she is having discomfort with her left wrist.  No further follow-up at this point as needed since she is doing so well from neurostandpoint.

## 2023-05-13 ENCOUNTER — Non-Acute Institutional Stay (SKILLED_NURSING_FACILITY): Payer: Self-pay | Admitting: Adult Health

## 2023-05-13 ENCOUNTER — Encounter: Payer: Self-pay | Admitting: Adult Health

## 2023-05-13 DIAGNOSIS — I739 Peripheral vascular disease, unspecified: Secondary | ICD-10-CM | POA: Diagnosis not present

## 2023-05-13 DIAGNOSIS — I7121 Aneurysm of the ascending aorta, without rupture: Secondary | ICD-10-CM

## 2023-05-13 DIAGNOSIS — I700 Atherosclerosis of aorta: Secondary | ICD-10-CM | POA: Diagnosis not present

## 2023-05-13 NOTE — Progress Notes (Signed)
Location:  [facility] Room Number: 134P Place of Service:  SNF (31)   CODE STATUS: DNR  Allergies  Allergen Reactions   Penicillins Nausea And Vomiting and Other (See Comments)    Child hood allergy - Flu symptoms; constant vomiting  Did it involve swelling of the face/tongue/throat, SOB, or low BP? Unknown Did it involve sudden or severe rash/hives, skin peeling, or any reaction on the inside of your mouth or nose? Unknown Did you need to seek medical attention at a hospital or doctor's office? Unknown When did it last happen?     50+ years If all above answers are "NO", may proceed with cephalosporin use.    Codeine Nausea And Vomiting and Rash    INTOLERANCE >  VOMITING    Chief Complaint  Patient presents with   Acute Visit    Care Planning Meeting    HPI:  We have come together for her care plan meeting. BIMS  13/15 mood 0/30.  She is out of bed ad lib without falls. She requires supervision for her adl care. She is continent of bladder and bowel. Dietary; independent with meals; regular diet appetite 51-75% weight is 109 pounds. Therapy: none at this time. She continues to be followed for her chronic illnesses including:   Aortic atherosclerosis     PVD(Peripheral vascular disease)    Aneurysm of ascending aorta without rupture   Past Medical History:  Diagnosis Date   Acute blood loss anemia 03/22/2015   Acute renal failure syndrome (HCC)    AKI (acute kidney injury) (HCC)    Anemia    Aortic stenosis    Bilateral carpal tunnel syndrome 05/15/2018   Bruising    large bruise to left neck and T/O body   Chronic constipation 03/15/2019   Closed left humeral fracture 03/08/2019   Closed right hip fracture (HCC) 03/14/2015   Depression    Early cataract    Fracture of femoral neck, left, closed (HCC) 03/08/2019   Headache    Hip fracture (HCC)    HTN (hypertension)    Humerus fracture    Leukocytosis    Major depression, recurrent, chronic  (HCC) 03/15/2019   Osteonecrosis (HCC)    post-traumatic osteonecrosis of the femoral head of the left hip, retained orthopedic hardware ( order for conset)   Osteoporosis    Peripheral vascular disease (HCC)    blood clot right leg   Protein-calorie malnutrition, severe 09/21/2019   Rheumatoid arthritis (HCC)    S/P TAVR (transcatheter aortic valve replacement) 08/08/2019   Sequelae of open wound of right lower extremity 03/15/2019   Severe aortic stenosis 07/19/2019   Thoracic ascending aortic aneurysm (HCC)    4.4 cm by 07/26/19 CTA   Traumatic arthritis of left hip 10/02/2019   Ulcer of lower extremity (HCC)    Wears glasses     Past Surgical History:  Procedure Laterality Date   ABDOMINAL AORTOGRAM W/LOWER EXTREMITY N/A 05/08/2019   Procedure: ABDOMINAL AORTOGRAM W/LOWER EXTREMITY;  Surgeon: Nada Libman, MD;  Location: [facility] INVASIVE CV LAB;  Service: Cardiovascular;  Laterality: N/A;   CATARACT EXTRACTION W/PHACO Left 10/24/2020   Procedure: CATARACT EXTRACTION PHACO AND INTRAOCULAR LENS PLACEMENT LEFT EYE;  Surgeon: Fabio Pierce, MD;  Location: [facility] ORS;  Service: Ophthalmology;  Laterality: Left;  CDE  20.05   CATARACT EXTRACTION W/PHACO Right 11/07/2020   Procedure: CATARACT EXTRACTION PHACO AND INTRAOCULAR LENS PLACEMENT RIGHT EYE;  Surgeon: Fabio Pierce, MD;  Location: [facility];  Service: Ophthalmology;  Laterality: Right;  right, CDE=24.26   HARDWARE REMOVAL Left 10/02/2019   Procedure: HARDWARE REMOVAL;  Surgeon: Kathryne Hitch, MD;  Location: [facility] OR;  Service: Orthopedics;  Laterality: Left;   HIP PINNING,CANNULATED Left 03/09/2019   Procedure: CANNULATED HIP PINNING;  Surgeon: Kathryne Hitch, MD;  Location: [facility] OR;  Service: Orthopedics;  Laterality: Left;   INTRAMEDULLARY (IM) NAIL INTERTROCHANTERIC Right 03/14/2015   Procedure: RIGHT INTERTROCHANTRIC INTRAMEDULLARY (IM) NAIL ;  Surgeon: Kathryne Hitch, MD;  Location: [facility] OR;  Service: Orthopedics;   Laterality: Right;   PERIPHERAL VASCULAR INTERVENTION Right 05/08/2019   Procedure: PERIPHERAL VASCULAR INTERVENTION;  Surgeon: Nada Libman, MD;  Location: [facility];  Service: Cardiovascular;  Laterality: Right;   RIGHT/LEFT HEART CATH AND CORONARY ANGIOGRAPHY N/A 07/19/2019   Procedure: RIGHT/LEFT HEART CATH AND CORONARY ANGIOGRAPHY;  Surgeon: Tonny Bollman, MD;  Location: [facility] INVASIVE CV LAB;  Service: Cardiovascular;  Laterality: N/A;   TEE WITHOUT CARDIOVERSION N/A 08/07/2019   Procedure: TRANSESOPHAGEAL ECHOCARDIOGRAM (TEE);  Surgeon: Tonny Bollman, MD;  Location: [facility] INVASIVE CV LAB;  Service: Open Heart Surgery;  Laterality: N/A;   TOTAL HIP ARTHROPLASTY Left 10/02/2019   Procedure: LEFT TOTAL HIP ARTHROPLASTY ANTERIOR APPROACH AND REMOVAL OF CANNULATED SCREWS OF LEFT HIP;  Surgeon: Kathryne Hitch, MD;  Location: [facility] OR;  Service: Orthopedics;  Laterality: Left;   TRANSCATHETER AORTIC VALVE REPLACEMENT, TRANSFEMORAL  08/07/2019   TRANSCATHETER AORTIC VALVE REPLACEMENT, TRANSFEMORAL N/A 08/07/2019   Procedure: TRANSCATHETER AORTIC VALVE REPLACEMENT, TRANSFEMORAL;  Surgeon: Tonny Bollman, MD;  Location: [facility] INVASIVE CV LAB;  Service: Open Heart Surgery;  Laterality: N/A;    Social History   Socioeconomic History   Marital status: Widowed    Spouse name: Not on file   Number of children: Not on file   Years of education: Not on file   Highest education level: 12th grade  Occupational History   Occupation: retired  Tobacco Use   Smoking status: Never    Passive exposure: Never   Smokeless tobacco: Never  Vaping Use   Vaping status: Never Used  Substance and Sexual Activity   Alcohol use: No   Drug use: No   Sexual activity: Not on file  Other Topics Concern   Not on file  Social History Narrative   Not on file   Social Determinants of Health   Financial Resource Strain: Low Risk  (04/19/2023)   Overall Financial Resource Strain (CARDIA)     Difficulty of Paying Living Expenses: Not hard at all  Food Insecurity: No Food Insecurity (04/19/2023)   Hunger Vital Sign    Worried About Running Out of Food in the Last Year: Never true    Ran Out of Food in the Last Year: Never true  Transportation Needs: No Transportation Needs (04/19/2023)   PRAPARE - Administrator, Civil Service (Medical): No    Lack of Transportation (Non-Medical): No  Physical Activity: Unknown (04/19/2023)   Exercise Vital Sign    Days of Exercise per Week: Patient declined    Minutes of Exercise per Session: 20 min  Stress: Stress Concern Present (04/19/2023)   [employer] of Occupational Health - Occupational Stress Questionnaire    Feeling of Stress : Very much  Social Connections: Moderately Integrated (04/19/2023)   Social Connection and Isolation Panel [NHANES]    Frequency of Communication with Friends and Family: Three times a week    Frequency of Social Gatherings with Friends and Family: Once  a week    Attends Religious Services: More than 4 times per year    Active Member of Clubs or Organizations: Yes    Attends Banker Meetings: More than 4 times per year    Marital Status: Widowed  Intimate Partner Violence: Not At Risk (07/27/2022)   Humiliation, Afraid, Rape, and Kick questionnaire    Fear of Current or Ex-Partner: No    Emotionally Abused: No    Physically Abused: No    Sexually Abused: No   Family History  Problem Relation Age of Onset   Cancer Sister       VITAL SIGNS BP 124/70   Pulse 88   Temp (!) 96.7 F (35.9 C)   Resp 20   Ht 5\' 2"  (1.575 m)   Wt 109 lb (49.4 kg)   SpO2 99%   BMI 19.94 kg/m   Outpatient Encounter Medications as of 05/13/2023  Medication Sig   acetaminophen (TYLENOL) 325 MG tablet Take 2 tablets (650 mg total) by mouth every 6 (six) hours as needed for mild pain (or Fever >/= 101).   amLODipine (NORVASC) 5 MG tablet TAKE 1 TABLET BY MOUTH DAILY.   azithromycin  (ZITHROMAX) 500 MG tablet Take 1 tablet (500 mg) one hour prior to all dental visits.   Boswellia-Glucosamine-Vit D (OSTEO BI-FLEX ONE PER DAY PO) Take 1 tablet by mouth daily.   Cholecalciferol (VITAMIN D-3) 125 MCG (5000 UT) TABS Take 1 tablet by mouth daily.   clopidogrel (PLAVIX) 75 MG tablet TAKE 1 TABLET(75 MG) BY MOUTH DAILY   Lactobacillus (PROBIOTIC ACIDOPHILUS PO) Take 1 tablet by mouth daily.   magnesium oxide (MAG-OX) 400 (240 Mg) MG tablet TAKE 1 TABLET(400 MG) BY MOUTH DAILY   sertraline (ZOLOFT) 100 MG tablet Take 1.5 tablets (150 mg total) by mouth daily.   vitamin B-12 (CYANOCOBALAMIN) 1000 MCG tablet Take 1,000 mcg by mouth daily.   traMADol (ULTRAM) 50 MG tablet Take 1 tablet (50 mg total) by mouth every 6 (six) hours as needed. (Patient not taking: Reported on 03/28/2023)   No facility-administered encounter medications on file as of 05/13/2023.     SIGNIFICANT DIAGNOSTIC EXAMS  TODAY  04-25-23: wbc 7.3; hgb 11.7; hct 37.5; mcv 96.2 plt 226; glucose 119; bun 38; creat 1.44; k+ 4.5; na++ 136; ca 9.2 gfr 35; protein 6.6; albumin 4.2; vitamin D 98.65 chol 134; ldl 34; trig 176; hdl 64   Review of Systems  Constitutional:  Negative for malaise/fatigue.  Respiratory:  Negative for cough and shortness of breath.   Cardiovascular:  Negative for chest pain, palpitations and leg swelling.  Gastrointestinal:  Negative for abdominal pain, constipation and heartburn.  Musculoskeletal:  Negative for back pain, joint pain and myalgias.  Skin: Negative.   Neurological:  Negative for dizziness.  Psychiatric/Behavioral:  The patient is not nervous/anxious.    Physical Exam Constitutional:      General: She is not in acute distress.    Appearance: She is well-developed. She is not diaphoretic.  Eyes:     Comments: Right pupil >left pupil    Neck:     Thyroid: No thyromegaly.  Cardiovascular:     Rate and Rhythm: Normal rate and regular rhythm.     Pulses: Normal pulses.      Heart sounds: Normal heart sounds.  Pulmonary:     Effort: Pulmonary effort is normal. No respiratory distress.     Breath sounds: Normal breath sounds.  Abdominal:     General: Bowel  sounds are normal. There is no distension.     Palpations: Abdomen is soft.     Tenderness: There is no abdominal tenderness.  Musculoskeletal:        General: Normal range of motion.     Cervical back: Neck supple.     Right lower leg: No edema.     Left lower leg: No edema.  Lymphadenopathy:     Cervical: No cervical adenopathy.  Skin:    General: Skin is warm and dry.  Neurological:     Mental Status: She is alert and oriented to person, place, and time.  Psychiatric:        Mood and Affect: Mood normal.      ASSESSMENT/ PLAN:  TODAY  Aortic atherosclerosis PVD(Peripheral vascular disease) Aneurysm of ascending aorta without rupture  Will continue current medications Will continue therapy as directed Will continue to monitor her status.  Goals of care: to high [place]   Time spent with patient: 40 minutes: therapy; medications; plan of care.    Synthia Innocent NP [facility]   call [phone]

## 2023-05-16 ENCOUNTER — Ambulatory Visit (HOSPITAL_BASED_OUTPATIENT_CLINIC_OR_DEPARTMENT_OTHER): Payer: Medicare Other | Admitting: Family Medicine

## 2023-05-17 ENCOUNTER — Ambulatory Visit (HOSPITAL_BASED_OUTPATIENT_CLINIC_OR_DEPARTMENT_OTHER): Payer: Medicare Other | Admitting: Family Medicine

## 2023-05-18 ENCOUNTER — Telehealth: Payer: Self-pay | Admitting: Orthopedic Surgery

## 2023-05-18 NOTE — Telephone Encounter (Signed)
Please advise on this.

## 2023-05-18 NOTE — Telephone Encounter (Signed)
Huntley Dec from [facility] called requesting a call back on secure line. [facility] send a request for Dr Christell Constant to follow care for pt to have PT and OT Please call Huntley Dec at [phone].

## 2023-05-23 ENCOUNTER — Encounter: Payer: Self-pay | Admitting: Adult Health

## 2023-05-23 ENCOUNTER — Non-Acute Institutional Stay (SKILLED_NURSING_FACILITY): Payer: Self-pay | Admitting: Adult Health

## 2023-05-23 ENCOUNTER — Other Ambulatory Visit (HOSPITAL_BASED_OUTPATIENT_CLINIC_OR_DEPARTMENT_OTHER): Payer: Self-pay | Admitting: *Deleted

## 2023-05-23 ENCOUNTER — Telehealth (HOSPITAL_BASED_OUTPATIENT_CLINIC_OR_DEPARTMENT_OTHER): Payer: Self-pay | Admitting: Family Medicine

## 2023-05-23 ENCOUNTER — Ambulatory Visit: Payer: Medicare Other | Admitting: Orthopedic Surgery

## 2023-05-23 DIAGNOSIS — M069 Rheumatoid arthritis, unspecified: Secondary | ICD-10-CM

## 2023-05-23 DIAGNOSIS — S12190S Other displaced fracture of second cervical vertebra, sequela: Secondary | ICD-10-CM | POA: Diagnosis not present

## 2023-05-23 DIAGNOSIS — I7121 Aneurysm of the ascending aorta, without rupture: Secondary | ICD-10-CM | POA: Diagnosis not present

## 2023-05-23 NOTE — Telephone Encounter (Signed)
I called and advised Huntley Dec of Dr. Kathi Der message.

## 2023-05-23 NOTE — Telephone Encounter (Signed)
Patient is needing the medication rosuvastain sent over to [facility] on [address] pt daughter would like a call back regarding this matter

## 2023-05-23 NOTE — Progress Notes (Signed)
Location:  [facility] Room Number: 134 Place of Service:  SNF (31)   CODE STATUS: dnr   Allergies  Allergen Reactions   Penicillins Nausea And Vomiting and Other (See Comments)    Child hood allergy - Flu symptoms; constant vomiting  Did it involve swelling of the face/tongue/throat, SOB, or low BP? Unknown Did it involve sudden or severe rash/hives, skin peeling, or any reaction on the inside of your mouth or nose? Unknown Did you need to seek medical attention at a hospital or doctor's office? Unknown When did it last happen?     50+ years If all above answers are "NO", may proceed with cephalosporin use.    Codeine Nausea And Vomiting and Rash    INTOLERANCE >  VOMITING    Chief Complaint  Patient presents with   Discharge Note    HPI:  She is being discharged to [facility] with pt/ot. She will not need any dme. The facility will provide her medications. She will need to follow up with her medical provider. She had been hospitalized for for a C2 and C7 fractures with a UTI. She had been discharged to home; and was unable to provide her care. She was admitted to this facility for short term rehab. She has completed therapy.   Past Medical History:  Diagnosis Date   Acute blood loss anemia 03/22/2015   Acute renal failure syndrome (HCC)    AKI (acute kidney injury) (HCC)    Anemia    Aortic stenosis    Bilateral carpal tunnel syndrome 05/15/2018   Bruising    large bruise to left neck and T/O body   Chronic constipation 03/15/2019   Closed left humeral fracture 03/08/2019   Closed right hip fracture (HCC) 03/14/2015   Depression    Early cataract    Fracture of femoral neck, left, closed (HCC) 03/08/2019   Headache    Hip fracture (HCC)    HTN (hypertension)    Humerus fracture    Leukocytosis    Major depression, recurrent, chronic (HCC) 03/15/2019   Osteonecrosis (HCC)    post-traumatic osteonecrosis of the femoral head of the  left hip, retained orthopedic hardware ( order for conset)   Osteoporosis    Peripheral vascular disease (HCC)    blood clot right leg   Protein-calorie malnutrition, severe 09/21/2019   Rheumatoid arthritis (HCC)    S/P TAVR (transcatheter aortic valve replacement) 08/08/2019   Sequelae of open wound of right lower extremity 03/15/2019   Severe aortic stenosis 07/19/2019   Thoracic ascending aortic aneurysm (HCC)    4.4 cm by 07/26/19 CTA   Traumatic arthritis of left hip 10/02/2019   Ulcer of lower extremity (HCC)    Wears glasses     Past Surgical History:  Procedure Laterality Date   ABDOMINAL AORTOGRAM W/LOWER EXTREMITY N/A 05/08/2019   Procedure: ABDOMINAL AORTOGRAM W/LOWER EXTREMITY;  Surgeon: Nada Libman, MD;  Location: [facility] INVASIVE CV LAB;  Service: Cardiovascular;  Laterality: N/A;   CATARACT EXTRACTION W/PHACO Left 10/24/2020   Procedure: CATARACT EXTRACTION PHACO AND INTRAOCULAR LENS PLACEMENT LEFT EYE;  Surgeon: Fabio Pierce, MD;  Location: [facility] ORS;  Service: Ophthalmology;  Laterality: Left;  CDE  20.05   CATARACT EXTRACTION W/PHACO Right 11/07/2020   Procedure: CATARACT EXTRACTION PHACO AND INTRAOCULAR LENS PLACEMENT RIGHT EYE;  Surgeon: Fabio Pierce, MD;  Location: [facility];  Service: Ophthalmology;  Laterality: Right;  right, CDE=24.26   HARDWARE REMOVAL Left 10/02/2019  Procedure: HARDWARE REMOVAL;  Surgeon: Kathryne Hitch, MD;  Location: [facility] OR;  Service: Orthopedics;  Laterality: Left;   HIP PINNING,CANNULATED Left 03/09/2019   Procedure: CANNULATED HIP PINNING;  Surgeon: Kathryne Hitch, MD;  Location: [facility] OR;  Service: Orthopedics;  Laterality: Left;   INTRAMEDULLARY (IM) NAIL INTERTROCHANTERIC Right 03/14/2015   Procedure: RIGHT INTERTROCHANTRIC INTRAMEDULLARY (IM) NAIL ;  Surgeon: Kathryne Hitch, MD;  Location: [facility] OR;  Service: Orthopedics;  Laterality: Right;   PERIPHERAL VASCULAR INTERVENTION Right 05/08/2019   Procedure: PERIPHERAL VASCULAR  INTERVENTION;  Surgeon: Nada Libman, MD;  Location: [facility] CV LAB;  Service: Cardiovascular;  Laterality: Right;   RIGHT/LEFT HEART CATH AND CORONARY ANGIOGRAPHY N/A 07/19/2019   Procedure: RIGHT/LEFT HEART CATH AND CORONARY ANGIOGRAPHY;  Surgeon: Tonny Bollman, MD;  Location: [facility] INVASIVE CV LAB;  Service: Cardiovascular;  Laterality: N/A;   TEE WITHOUT CARDIOVERSION N/A 08/07/2019   Procedure: TRANSESOPHAGEAL ECHOCARDIOGRAM (TEE);  Surgeon: Tonny Bollman, MD;  Location: [facility] INVASIVE CV LAB;  Service: Open Heart Surgery;  Laterality: N/A;   TOTAL HIP ARTHROPLASTY Left 10/02/2019   Procedure: LEFT TOTAL HIP ARTHROPLASTY ANTERIOR APPROACH AND REMOVAL OF CANNULATED SCREWS OF LEFT HIP;  Surgeon: Kathryne Hitch, MD;  Location: [facility] OR;  Service: Orthopedics;  Laterality: Left;   TRANSCATHETER AORTIC VALVE REPLACEMENT, TRANSFEMORAL  08/07/2019   TRANSCATHETER AORTIC VALVE REPLACEMENT, TRANSFEMORAL N/A 08/07/2019   Procedure: TRANSCATHETER AORTIC VALVE REPLACEMENT, TRANSFEMORAL;  Surgeon: Tonny Bollman, MD;  Location: [facility] INVASIVE CV LAB;  Service: Open Heart Surgery;  Laterality: N/A;    Social History   Socioeconomic History   Marital status: Widowed    Spouse name: Not on file   Number of children: Not on file   Years of education: Not on file   Highest education level: 12th grade  Occupational History   Occupation: retired  Tobacco Use   Smoking status: Never    Passive exposure: Never   Smokeless tobacco: Never  Vaping Use   Vaping status: Never Used  Substance and Sexual Activity   Alcohol use: No   Drug use: No   Sexual activity: Not on file  Other Topics Concern   Not on file  Social History Narrative   Not on file   Social Determinants of Health   Financial Resource Strain: Low Risk  (04/19/2023)   Overall Financial Resource Strain (CARDIA)    Difficulty of Paying Living Expenses: Not hard at all  Food Insecurity: No Food Insecurity (04/19/2023)   Hunger  Vital Sign    Worried About Running Out of Food in the Last Year: Never true    Ran Out of Food in the Last Year: Never true  Transportation Needs: No Transportation Needs (04/19/2023)   PRAPARE - Administrator, Civil Service (Medical): No    Lack of Transportation (Non-Medical): No  Physical Activity: Unknown (04/19/2023)   Exercise Vital Sign    Days of Exercise per Week: Patient declined    Minutes of Exercise per Session: 20 min  Stress: Stress Concern Present (04/19/2023)   [employer] of Occupational Health - Occupational Stress Questionnaire    Feeling of Stress : Very much  Social Connections: Moderately Integrated (04/19/2023)   Social Connection and Isolation Panel [NHANES]    Frequency of Communication with Friends and Family: Three times a week    Frequency of Social Gatherings with Friends and Family: Once a week    Attends Religious Services: More than 4 times per year  Active Member of Clubs or Organizations: Yes    Attends Banker Meetings: More than 4 times per year    Marital Status: Widowed  Intimate Partner Violence: Not At Risk (07/27/2022)   Humiliation, Afraid, Rape, and Kick questionnaire    Fear of Current or Ex-Partner: No    Emotionally Abused: No    Physically Abused: No    Sexually Abused: No   Family History  Problem Relation Age of Onset   Cancer Sister       VITAL SIGNS BP (!) 120/48   Pulse 71   Temp (!) 97.1 F (36.2 C)   Resp 18   Ht 5\' 2"  (1.575 m)   Wt 109 lb 12.8 oz (49.8 kg)   SpO2 98%   BMI 20.08 kg/m   Outpatient Encounter Medications as of 05/23/2023  Medication Sig   acetaminophen (TYLENOL) 325 MG tablet Take 2 tablets (650 mg total) by mouth every 6 (six) hours as needed for mild pain (or Fever >/= 101).   amLODipine (NORVASC) 5 MG tablet TAKE 1 TABLET BY MOUTH DAILY.   azithromycin (ZITHROMAX) 500 MG tablet Take 1 tablet (500 mg) one hour prior to all dental visits.    Boswellia-Glucosamine-Vit D (OSTEO BI-FLEX ONE PER DAY PO) Take 1 tablet by mouth daily.   Cholecalciferol (VITAMIN D-3) 125 MCG (5000 UT) TABS Take 1 tablet by mouth daily.   clopidogrel (PLAVIX) 75 MG tablet TAKE 1 TABLET(75 MG) BY MOUTH DAILY   Lactobacillus (PROBIOTIC ACIDOPHILUS PO) Take 1 tablet by mouth daily.   magnesium oxide (MAG-OX) 400 (240 Mg) MG tablet TAKE 1 TABLET(400 MG) BY MOUTH DAILY   sertraline (ZOLOFT) 100 MG tablet Take 1.5 tablets (150 mg total) by mouth daily.   traMADol (ULTRAM) 50 MG tablet Take 1 tablet (50 mg total) by mouth every 6 (six) hours as needed. (Patient not taking: Reported on 03/28/2023)   vitamin B-12 (CYANOCOBALAMIN) 1000 MCG tablet Take 1,000 mcg by mouth daily.   No facility-administered encounter medications on file as of 05/23/2023.     SIGNIFICANT DIAGNOSTIC EXAMS  TODAY  04-25-23: wbc 7.3; hgb 11.7; hct 37.5; mcv 96.2 plt 226; glucose 119; bun 38; creat 1.44; k+ 4.5; na++ 136; ca 9.2 gfr 35; protein 6.6; albumin 4.2; vitamin D 98.65 chol 134; ldl 34; trig 176; hdl 64   Review of Systems  Constitutional:  Negative for malaise/fatigue.  Respiratory:  Negative for cough and shortness of breath.   Cardiovascular:  Negative for chest pain, palpitations and leg swelling.  Gastrointestinal:  Negative for abdominal pain, constipation and heartburn.  Musculoskeletal:  Negative for back pain, joint pain and myalgias.  Skin: Negative.   Neurological:  Negative for dizziness.  Psychiatric/Behavioral:  The patient is not nervous/anxious.    Physical Exam Constitutional:      General: She is not in acute distress.    Appearance: She is well-developed. She is not diaphoretic.  Eyes:     Comments: Right pupil >left pupil     Neck:     Thyroid: No thyromegaly.  Cardiovascular:     Rate and Rhythm: Normal rate and regular rhythm.     Heart sounds: Normal heart sounds.  Pulmonary:     Effort: Pulmonary effort is normal. No respiratory distress.      Breath sounds: Normal breath sounds.  Abdominal:     General: Bowel sounds are normal. There is no distension.     Palpations: Abdomen is soft.     Tenderness:  There is no abdominal tenderness.  Musculoskeletal:        General: Normal range of motion.     Cervical back: Neck supple.     Right lower leg: No edema.     Left lower leg: No edema.  Lymphadenopathy:     Cervical: No cervical adenopathy.  Skin:    General: Skin is warm and dry.  Neurological:     Mental Status: She is alert. Mental status is at baseline.  Psychiatric:        Mood and Affect: Mood normal.     Comments: 04-25-23: BCAT 28/50      ASSESSMENT/ PLAN:   Patient is being discharged with the following home health services:  pt/ot to evaluate and treat as indicated for gait balance strength adl training  Patient is being discharged with the following durable medical equipment:  none needed   Patient has been advised to f/u with their PCP in 1-2 weeks to for a transitions of care visit.  Social services at their facility was responsible for arranging this appointment.  Pt was provided with adequate prescriptions of noncontrolled medications to reach the scheduled appointment .  For controlled substances, a limited supply was provided as appropriate for the individual patient.  If the pt normally receives these medications from a pain clinic or has a contract with another physician, these medications should be received from that clinic or physician only).    Facility will provide medications.   Synthia Innocent NP [facility]  call [phone]

## 2023-05-23 NOTE — Telephone Encounter (Signed)
Copied from CRM [id number]. Topic: Clinical - Medication Refill >> May 23, 2023  3:11 PM Cassiday T wrote: Most Recent Primary Care Visit:  Provider: DE Peru, RAYMOND J  Department: [facility]  Visit Type: FOLLOW UP  Date: 04/20/2023  Medication: rosuvastain   Has the patient contacted their pharmacy? No (Agent: If no, request that the patient contact the pharmacy for the refill. If patient does not wish to contact the pharmacy document the reason why and proceed with request.) (Agent: If yes, when and what did the pharmacy advise?)  [facility] on [address] [place] [address] Has the prescription been filled recently? No  Is the patient out of the medication? Yes  Has the patient been seen for an appointment in the last year OR does the patient have an upcoming appointment? Yes  Can we respond through MyChart? No  Agent: Please be advised that Rx refills may take up to 3 business days. We ask that you follow-up with your pharmacy.

## 2023-05-24 DIAGNOSIS — M069 Rheumatoid arthritis, unspecified: Secondary | ICD-10-CM | POA: Diagnosis not present

## 2023-05-24 DIAGNOSIS — I7121 Aneurysm of the ascending aorta, without rupture: Secondary | ICD-10-CM | POA: Diagnosis not present

## 2023-05-24 DIAGNOSIS — I739 Peripheral vascular disease, unspecified: Secondary | ICD-10-CM | POA: Diagnosis not present

## 2023-05-24 DIAGNOSIS — B9620 Unspecified Escherichia coli [E. coli] as the cause of diseases classified elsewhere: Secondary | ICD-10-CM | POA: Diagnosis not present

## 2023-05-24 DIAGNOSIS — N390 Urinary tract infection, site not specified: Secondary | ICD-10-CM | POA: Diagnosis not present

## 2023-05-24 DIAGNOSIS — E46 Unspecified protein-calorie malnutrition: Secondary | ICD-10-CM | POA: Diagnosis not present

## 2023-05-24 DIAGNOSIS — R296 Repeated falls: Secondary | ICD-10-CM | POA: Diagnosis not present

## 2023-05-24 DIAGNOSIS — N179 Acute kidney failure, unspecified: Secondary | ICD-10-CM | POA: Diagnosis not present

## 2023-05-24 DIAGNOSIS — I350 Nonrheumatic aortic (valve) stenosis: Secondary | ICD-10-CM | POA: Diagnosis not present

## 2023-05-24 DIAGNOSIS — N1831 Chronic kidney disease, stage 3a: Secondary | ICD-10-CM | POA: Diagnosis not present

## 2023-05-24 DIAGNOSIS — M80032D Age-related osteoporosis with current pathological fracture, left forearm, subsequent encounter for fracture with routine healing: Secondary | ICD-10-CM | POA: Diagnosis not present

## 2023-05-24 DIAGNOSIS — Z9181 History of falling: Secondary | ICD-10-CM | POA: Diagnosis not present

## 2023-05-24 DIAGNOSIS — M8008XD Age-related osteoporosis with current pathological fracture, vertebra(e), subsequent encounter for fracture with routine healing: Secondary | ICD-10-CM | POA: Diagnosis not present

## 2023-05-24 DIAGNOSIS — Z7902 Long term (current) use of antithrombotics/antiplatelets: Secondary | ICD-10-CM | POA: Diagnosis not present

## 2023-05-24 DIAGNOSIS — R627 Adult failure to thrive: Secondary | ICD-10-CM | POA: Diagnosis not present

## 2023-05-24 DIAGNOSIS — F339 Major depressive disorder, recurrent, unspecified: Secondary | ICD-10-CM | POA: Diagnosis not present

## 2023-05-24 DIAGNOSIS — I129 Hypertensive chronic kidney disease with stage 1 through stage 4 chronic kidney disease, or unspecified chronic kidney disease: Secondary | ICD-10-CM | POA: Diagnosis not present

## 2023-05-25 ENCOUNTER — Telehealth: Payer: Self-pay | Admitting: Cardiovascular Disease

## 2023-05-25 NOTE — Telephone Encounter (Signed)
Pt daughter Alvis Lemmings advised with verbal understanding

## 2023-05-25 NOTE — Telephone Encounter (Signed)
Pt c/o medication issue:  1. Name of Medication:   rosuvastatin (CRESTOR) tablet 10 mg    2. How are you currently taking this medication (dosage and times per day)?  Not taking  3. Are you having a reaction (difficulty breathing--STAT)?   4. What is your medication issue?   Daughter Debria Garret) is following up on this medication as patient is now in an Assisted Living facility due to neck injury.  Daughter wants to know if patient needs to be on this medication as it had fallen off her medication list.

## 2023-05-26 ENCOUNTER — Ambulatory Visit: Payer: Medicare Other | Admitting: Orthopedic Surgery

## 2023-05-26 ENCOUNTER — Other Ambulatory Visit (INDEPENDENT_AMBULATORY_CARE_PROVIDER_SITE_OTHER): Payer: Medicare Other

## 2023-05-26 DIAGNOSIS — S12190A Other displaced fracture of second cervical vertebra, initial encounter for closed fracture: Secondary | ICD-10-CM

## 2023-05-26 NOTE — Progress Notes (Signed)
Orthopedic Spine Surgery Office Note   Assessment: Patient is a 89 y.o. female with C2 lateral mass fracture with intra-articular extension that is minimally displaced.  Not having any neck pain. No displacement seen on x-ray     Plan: -No operative intervention planned at this time -Patient should remain in a cervical collar for a total of 3 months (ending 07/07/23) -No bending/lifting/twisting greater than 10 pounds -Patient should return to office in 6 weeks, x-rays at next visit: AP/lateral/open-mouth cervical x-rays     Patient expressed understanding of the plan and all questions were answered to the patient's satisfaction.    ___________________________________________________________________________     History:   Patient is a 89 y.o. female who presents today for follow up on her cervical spine.  Patient had a fall at her home on 04/06/2023. She initially had neck pain but is not having any neck pain now. She has been in the hard collar all times except for bathing.      Physical Exam:   General: no acute distress, appears stated age Neurologic: alert, answering questions appropriately, following commands Respiratory: unlabored breathing on room air, symmetric chest rise Psychiatric: appropriate affect, normal cadence to speech     MSK (spine):   -Strength exam                                                   Left                  Right Grip strength                5/5                  5/5 Interosseus                  5/5                  5/5 Wrist extension            5/5                  5/5 Wrist flexion                 5/5                  5/5 Elbow flexion                5/5                  5/5 Deltoid                          5/5                  5/5   -Sensory exam                             Sensation intact to light touch in C5-T1 nerve distributions of bilateral upper extremities   -Brachioradialis DTR: 2/4 on the left, 2/4 on the right -Biceps DTR:  2/4 on the left, 2/4 on the right   -Hoffman sign: Negative bilaterally -Clonus: No beats bilaterally -Interosseous wasting: None seen -Grip and release test: Negative -Gait: Slow but normal without assistive  devices     Imaging: XRs of the cervical spine from 05/26/2023 were independently reviewed and interpreted, showing degenerative changes throughout the cervical spine.  There is disc height loss and anterior osteophyte formation at every level.  Positive sagittal balance. No displacement of the C2 lateral mass seen on the open mouth view. No significant C1/2 arthrosis seen on the open mouth view.   CT of the cervical spine from 04/09/2023 was independently reviewed and interpreted, showing C2 lateral mass fracture with intra-articular extension at the C1/2 joint.  Fracture is minimally displaced.  Nondisplaced right C7/T1 facet fracture.     Patient name: Michaela Simpson Patient MRN: [id number] Date of visit: 05/26/23

## 2023-05-27 ENCOUNTER — Other Ambulatory Visit: Payer: Self-pay | Admitting: *Deleted

## 2023-05-27 DIAGNOSIS — F339 Major depressive disorder, recurrent, unspecified: Secondary | ICD-10-CM | POA: Diagnosis not present

## 2023-05-27 DIAGNOSIS — N179 Acute kidney failure, unspecified: Secondary | ICD-10-CM | POA: Diagnosis not present

## 2023-05-27 DIAGNOSIS — M80032D Age-related osteoporosis with current pathological fracture, left forearm, subsequent encounter for fracture with routine healing: Secondary | ICD-10-CM | POA: Diagnosis not present

## 2023-05-27 DIAGNOSIS — N1831 Chronic kidney disease, stage 3a: Secondary | ICD-10-CM | POA: Diagnosis not present

## 2023-05-27 DIAGNOSIS — I129 Hypertensive chronic kidney disease with stage 1 through stage 4 chronic kidney disease, or unspecified chronic kidney disease: Secondary | ICD-10-CM | POA: Diagnosis not present

## 2023-05-27 DIAGNOSIS — M8008XD Age-related osteoporosis with current pathological fracture, vertebra(e), subsequent encounter for fracture with routine healing: Secondary | ICD-10-CM | POA: Diagnosis not present

## 2023-05-27 NOTE — Patient Outreach (Signed)
Post-Acute Care Manager follow up.  [facility] psychotherapist. Screening for potential chronic care management services as a benefit of health plan and primary care provider.  Verified with Melton Alar social worker, Mrs. Hoiland discharged from [facility] on 05/23/23. She transitioned to [facility] with [place] home health.   No identifiable complex care management needs.   Raiford Noble, MSN, RN, BSN [place]  [facility], [facility] RN Post- Acute Care Manager Direct Dial: [phone]

## 2023-05-27 NOTE — Telephone Encounter (Signed)
Per Dr Earmon Phoenix OV note on 03/09/22: Treated with rosuvastatin. Patient's daughter questions the need to continue this considering the patient's advanced age. I reviewed her history and considering her history of SFA revascularization and aortic atherosclerosis as well as coronary artery disease, I recommended to continue low-dose rosuvastatin. Her LDL cholesterol is 49 mg/dL and she seems to be tolerating the medication well. Returned call to daughter and made her aware of this. This has always been prescribed by Dr Myra Gianotti who manages her PAD. She verbalized understanding and states she has a call out to his office as well. No further questions.

## 2023-05-29 NOTE — Progress Notes (Incomplete)
Assessment/Plan:     Michaela Simpson is a very pleasant 89 y.o. year old RH female with a history of hypertension, hyperlipidemia, PAD, insomnia, recent closed lateral, minimally displaced, C2  fracture (03/2023), severe Ao Stenosis, history of thoracic aortic aneurysm, anxiety, depression,  B12 deficiency seen today for evaluation of memory loss. MoCA today is  /30***.  Workup is in progress, but findings are suspicious for dementia*** Patient needs assistance with some ADLs.  Patient no longer drives.  Dementia ***  MRI brain without contrast to assess for underlying structural abnormality and assess vascular load  Check B12, TSH Recommend good control of cardiovascular risk factors.   Continue to control mood as per PCP Folllow up in    Subjective:    The patient is accompanied by ***  who supplements the history.    How long did patient have memory difficulties?  For the last***.  Patient has difficulty remembering new information, recent conversations and names of people. repeats oneself?  Endorsed Disoriented when walking into a room?  Denies except occasionally not remembering what patient came to the room for ***  Leaving objects in unusual places?   Denies.  Wandering behavior? Denies.   Any personality changes, or depression, anxiety?  Has moments of irritability.*** Hallucinations or paranoia?  Denies.   Seizures? Denies.    Any sleep changes?  Sleeps well***does not sleep well***denies vivid dreams, REM behavior or sleepwalking.   Sleep apnea? Denies.   Any hygiene concerns?  Denies.   Independent of bathing and dressing?  Needs assistance getting dressed.*** Who is in charge of the medications? is in charge *** Who is in charge of the finances?   is in charge   *** Any changes in appetite?   Denies. ***   Patient have trouble swallowing?  Denies.   Does the patient cook?  No***  Any headaches?  Denies.   Chronic back pain?  Denies.   Ambulates with  difficulty? Needs a walker to ambulate ***   Recent falls or head injuries? Endorsed, after mechanical fall 04/06/23 with subsequent C2 closed fracture, requiring hard collar.  Vision changes?She has a history of cataracts.  Stroke like symptoms?  Denies.   Any tremors?  Denies.   Any anosmia?  Denies.   Any incontinence of urine? Urge incontinence Any bowel dysfunction? Denies.      Patient lives at Assisted Living  "[facility]" with [facility].   ***  History of heavy alcohol intake? Denies.   History of heavy tobacco use? Denies.   Family history of dementia?   ***Denies. Does patient drive?***No longer drives  Allergies  Allergen Reactions   Penicillins Nausea And Vomiting and Other (See Comments)    Child hood allergy - Flu symptoms; constant vomiting  Did it involve swelling of the face/tongue/throat, SOB, or low BP? Unknown Did it involve sudden or severe rash/hives, skin peeling, or any reaction on the inside of your mouth or nose? Unknown Did you need to seek medical attention at a hospital or doctor's office? Unknown When did it last happen?     50+ years If all above answers are "NO", may proceed with cephalosporin use.    Codeine Nausea And Vomiting and Rash    INTOLERANCE >  VOMITING    Current Outpatient Medications  Medication Instructions   acetaminophen (TYLENOL) 650 mg, Oral, Every 6 hours PRN   amLODipine (NORVASC) 5 MG tablet TAKE 1 TABLET BY MOUTH DAILY.   azithromycin (ZITHROMAX)  500 MG tablet Take 1 tablet (500 mg) one hour prior to all dental visits.   Boswellia-Glucosamine-Vit D (OSTEO BI-FLEX ONE PER DAY PO) 1 tablet, Oral, Daily   Cholecalciferol (VITAMIN D-3) 125 MCG (5000 UT) TABS 1 tablet, Oral, Daily   clopidogrel (PLAVIX) 75 MG tablet TAKE 1 TABLET(75 MG) BY MOUTH DAILY   cyanocobalamin (VITAMIN B12) 1,000 mcg, Oral, Daily   Lactobacillus (PROBIOTIC ACIDOPHILUS PO) 1 tablet, Oral, Daily   magnesium oxide (MAG-OX) 400 (240 Mg) MG tablet TAKE 1  TABLET(400 MG) BY MOUTH DAILY   sertraline (ZOLOFT) 150 mg, Oral, Daily   traMADol (ULTRAM) 50 mg, Oral, Every 6 hours PRN     VITALS:  There were no vitals filed for this visit.    PHYSICAL EXAM   HEENT:  Normocephalic, atraumatic. The mucous membranes are moist. The superficial temporal arteries are without ropiness or tenderness. Cardiovascular: Regular rate and rhythm. Lungs: Clear to auscultation bilaterally. Neck:  She is wearing a neck brace after C2 fracture in 03/2023  NEUROLOGICAL:     No data to display              No data to display           Orientation:  Alert and oriented to person, not to place and time***. No aphasia or dysarthria. Fund of knowledge is reduced. Recent and remote memory impaired.  Attention and concentration are reduced.  Able to name objects and unable to repeat phrases. Delayed recall    Cranial nerves: There is good facial symmetry. Extraocular muscles are intact and visual fields are full to confrontational testing. Speech is fluent and clear, no tongue deviation. Hearing is intact to conversational tone.*** Tone: Tone is good throughout. Sensation: Sensation is intact to light touch and pinprick throughout. Vibration is intact at the bilateral big toe. Coordination: The patient has no difficulty with RAM's or FNF bilaterally. Normal finger to nose  Motor: Strength is 5/5 in the bilateral upper and lower extremities. There is no pronator drift. There are no fasciculations noted. DTR's: Deep tendon reflexes are 2/4 .  Plantar responses are downgoing bilaterally. Gait and Station: The patient is able to ambulate with difficulty. Needs a walker to ambulate ***. Gait is cautious and narrow.      Thank you for allowing [place] the opportunity to participate in the care of this nice patient. Please do not hesitate to contact us for any questions or concerns.   Total time spent on today's visit was *** minutes dedicated to this patient today,  preparing to see patient, examining the patient, ordering tests and/or medications and counseling the patient, documenting clinical information in the EHR or other health record, independently interpreting results and communicating results to the patient/family, discussing treatment and goals, answering patient's questions and coordinating care.  Cc:  de Peru, Raymond J, MD  Marlowe Kays 05/29/2023 12:47 PM

## 2023-05-30 ENCOUNTER — Ambulatory Visit: Payer: Medicare Other

## 2023-05-30 ENCOUNTER — Encounter: Payer: Self-pay | Admitting: Physician Assistant

## 2023-05-30 ENCOUNTER — Other Ambulatory Visit: Payer: Medicare Other

## 2023-05-30 ENCOUNTER — Ambulatory Visit (INDEPENDENT_AMBULATORY_CARE_PROVIDER_SITE_OTHER): Payer: Medicare Other | Admitting: Physician Assistant

## 2023-05-30 VITALS — BP 140/71 | HR 80 | Resp 18 | Ht 61.0 in

## 2023-05-30 DIAGNOSIS — R413 Other amnesia: Secondary | ICD-10-CM

## 2023-05-30 NOTE — Patient Instructions (Addendum)
It was a pleasure to see you today at our office.   Recommendations:  Neurocognitive evaluation at our office  MRI of the brain when ok with orthopedics , the radiology office will call you to arrange you appointment  Discussed the role of memantine and its side effects.  Check labs today [address] Follow up in 3 months    For psychiatric meds, mood meds: Please have your primary care physician manage these medications.  If you have any severe symptoms of a stroke, or other severe issues such as confusion,severe chills or fever, etc call 911 or go to the ER as you may need to be evaluated further     For assessment of decision of mental capacity and competency:  Call Dr. Erick Blinks, geriatric psychiatrist at [phone]  Counseling regarding caregiver distress, including caregiver depression, anxiety and issues regarding community resources, adult day care programs, adult living facilities, or memory care questions:  please contact your  Primary Doctor's Social Worker   Whom to call: Memory  decline, memory medications: Call our office [phone]    [email or web address]   RECOMMENDATIONS FOR ALL PATIENTS WITH MEMORY PROBLEMS: 1. Continue to exercise (Recommend 30 minutes of walking everyday, or 3 hours every week) 2. Increase social interactions - continue going to [place] and enjoy social gatherings with friends and family 3. Eat healthy, avoid fried foods and eat more fruits and vegetables 4. Maintain adequate blood pressure, blood sugar, and blood cholesterol level. Reducing the risk of stroke and cardiovascular disease also helps promoting better memory. 5. Avoid stressful situations. Live a simple life and avoid aggravations. Organize your time and prepare for the next day in anticipation. 6. Sleep well, avoid any interruptions of sleep and avoid any distractions in the bedroom that may interfere with adequate sleep  quality 7. Avoid sugar, avoid sweets as there is a strong link between excessive sugar intake, diabetes, and cognitive impairment We discussed the Mediterranean diet, which has been shown to help patients reduce the risk of progressive memory disorders and reduces cardiovascular risk. This includes eating fish, eat fruits and green leafy vegetables, nuts like almonds and hazelnuts, walnuts, and also use olive oil. Avoid fast foods and fried foods as much as possible. Avoid sweets and sugar as sugar use has been linked to worsening of memory function.  There is always a concern of gradual progression of memory problems. If this is the case, then we may need to adjust level of care according to patient needs. Support, both to the patient and caregiver, should then be put into place.      You have been referred for a neuropsychological evaluation (i.e., evaluation of memory and thinking abilities). Please bring someone with you to this appointment if possible, as it is helpful for the doctor to hear from both you and another adult who knows you well. Please bring eyeglasses and hearing aids if you wear them.    The evaluation will take approximately 3 hours and has two parts:   The first part is a clinical interview with the neuropsychologist (Dr. Milbert Coulter or Dr. Roseanne Reno). During the interview, the neuropsychologist will speak with you and the individual you brought to the appointment.    The second part of the evaluation is testing with the doctor's technician Annabelle Harman or Selena Batten). During the testing, the technician will ask you to remember different types of material, solve problems, and answer some questionnaires. Your family member will not be present for this portion of the evaluation.  Please note: We must reserve several hours of the neuropsychologist's time and the psychometrician's time for your evaluation appointment. As such, there is a No-Show fee of $100. If you are unable to attend any of your  appointments, please contact our office as soon as possible to reschedule.      DRIVING: Regarding driving, in patients with progressive memory problems, driving will be impaired. We advise to have someone else do the driving if trouble finding directions or if minor accidents are reported. Independent driving assessment is available to determine safety of driving.   If you are interested in the driving assessment, you can contact the following:  [employer] in [place] [phone]  [facility] [phone]  [facility] [phone]  [facility] [phone] or [phone]   FALL PRECAUTIONS: Be cautious when walking. Scan the area for obstacles that may increase the risk of trips and falls. When getting up in the mornings, sit up at the edge of the bed for a few minutes before getting out of bed. Consider elevating the bed at the head end to avoid drop of blood pressure when getting up. Walk always in a well-lit room (use night lights in the walls). Avoid area rugs or power cords from appliances in the middle of the walkways. Use a walker or a cane if necessary and consider physical therapy for balance exercise. Get your eyesight checked regularly.  FINANCIAL OVERSIGHT: Supervision, especially oversight when making financial decisions or transactions is also recommended.  HOME SAFETY: Consider the safety of the kitchen when operating appliances like stoves, microwave oven, and blender. Consider having supervision and share cooking responsibilities until no longer able to participate in those. Accidents with firearms and other hazards in the house should be identified and addressed as well.   ABILITY TO BE LEFT ALONE: If patient is unable to contact 911 operator, consider using LifeLine, or when the need is there, arrange for someone to stay with patients. Smoking is a fire hazard, consider supervision or cessation. Risk of wandering should  be assessed by caregiver and if detected at any point, supervision and safe proof recommendations should be instituted.  MEDICATION SUPERVISION: Inability to self-administer medication needs to be constantly addressed. Implement a mechanism to ensure safe administration of the medications.      Mediterranean Diet A Mediterranean diet refers to food and lifestyle choices that are based on the traditions of countries located on the [employer]. This way of eating has been shown to help prevent certain conditions and improve outcomes for people who have chronic diseases, like kidney disease and heart disease. What are tips for following this plan? Lifestyle  Cook and eat meals together with your family, when possible. Drink enough fluid to keep your urine clear or pale yellow. Be physically active every day. This includes: Aerobic exercise like running or swimming. Leisure activities like gardening, walking, or housework. Get 7-8 hours of sleep each night. If recommended by your health care provider, drink red wine in moderation. This means 1 glass a day for nonpregnant women and 2 glasses a day for men. A glass of wine equals 5 oz (150 mL). Reading food labels  Check the serving size of packaged foods. For foods such as rice and pasta, the serving size refers to the amount of cooked product, not dry. Check the total fat in packaged foods. Avoid foods that have saturated fat or trans fats. Check the ingredients list for added sugars, such as corn syrup. Shopping  At the grocery store, buy most of your food from the areas near the walls of the store. This includes: Fresh fruits and vegetables (produce). Grains, beans, nuts, and seeds. Some of these may be available in unpackaged forms or large amounts (in bulk). Fresh seafood. Poultry and eggs. Low-fat dairy products. Buy whole ingredients instead of prepackaged foods. Buy fresh fruits and vegetables in-season from local farmers  markets. Buy frozen fruits and vegetables in resealable bags. If you do not have access to quality fresh seafood, buy precooked frozen shrimp or canned fish, such as tuna, salmon, or sardines. Buy small amounts of raw or cooked vegetables, salads, or olives from the deli or salad bar at your store. Stock your pantry so you always have certain foods on hand, such as olive oil, canned tuna, canned tomatoes, rice, pasta, and beans. Cooking  Cook foods with extra-virgin olive oil instead of using butter or other vegetable oils. Have meat as a side dish, and have vegetables or grains as your main dish. This means having meat in small portions or adding small amounts of meat to foods like pasta or stew. Use beans or vegetables instead of meat in common dishes like chili or lasagna. Experiment with different cooking methods. Try roasting or broiling vegetables instead of steaming or sauteing them. Add frozen vegetables to soups, stews, pasta, or rice. Add nuts or seeds for added healthy fat at each meal. You can add these to yogurt, salads, or vegetable dishes. Marinate fish or vegetables using olive oil, lemon juice, garlic, and fresh herbs. Meal planning  Plan to eat 1 vegetarian meal one day each week. Try to work up to 2 vegetarian meals, if possible. Eat seafood 2 or more times a week. Have healthy snacks readily available, such as: Vegetable sticks with hummus. Greek yogurt. Fruit and nut trail mix. Eat balanced meals throughout the week. This includes: Fruit: 2-3 servings a day Vegetables: 4-5 servings a day Low-fat dairy: 2 servings a day Fish, poultry, or lean meat: 1 serving a day Beans and legumes: 2 or more servings a week Nuts and seeds: 1-2 servings a day Whole grains: 6-8 servings a day Extra-virgin olive oil: 3-4 servings a day Limit red meat and sweets to only a few servings a month What are my food choices? Mediterranean diet Recommended Grains: Whole-grain pasta. Brown  rice. Bulgar wheat. Polenta. Couscous. Whole-wheat bread. Orpah Cobb. Vegetables: Artichokes. Beets. Broccoli. Cabbage. Carrots. Eggplant. Green beans. Chard. Kale. Spinach. Onions. Leeks. Peas. Squash. Tomatoes. Peppers. Radishes. Fruits: Apples. Apricots. Avocado. Berries. Bananas. Cherries. Dates. Figs. Grapes. Lemons. Melon. Oranges. Peaches. Plums. Pomegranate. Meats and other protein foods: Beans. Almonds. Sunflower seeds. Pine nuts. Peanuts. Cod. Salmon. Scallops. Shrimp. Tuna. Tilapia. Clams. Oysters. Eggs. Dairy: Low-fat milk. Cheese. Greek yogurt. Beverages: Water. Red wine. Herbal tea. Fats and oils: Extra virgin olive oil. Avocado oil. Grape seed oil. Sweets and desserts: [place] yogurt with honey. Baked apples. Poached pears. Trail mix. Seasoning and other foods: Basil. Cilantro. Coriander. Cumin. Mint. Parsley. Sage. Rosemary. Tarragon. Garlic. Oregano. Thyme. Pepper. Balsalmic vinegar. Tahini. Hummus. Tomato sauce. Olives. Mushrooms. Limit these Grains: Prepackaged pasta or rice dishes. Prepackaged cereal with added sugar. Vegetables: Deep fried potatoes (french fries). Fruits: Fruit canned in syrup. Meats and other protein foods: Beef. Pork. Lamb. Poultry with skin. Hot dogs. Tomasa Blase. Dairy: Ice cream. Sour cream. Whole milk. Beverages: Juice. Sugar-sweetened soft drinks. Beer. Liquor and spirits. Fats and oils: Butter. Canola oil. Vegetable oil. Beef fat (tallow). Lard. Sweets and desserts: Cookies.  Cakes. Pies. Candy. Seasoning and other foods: Mayonnaise. Premade sauces and marinades. The items listed may not be a complete list. Talk with your dietitian about what dietary choices are right for you. Summary The Mediterranean diet includes both food and lifestyle choices. Eat a variety of fresh fruits and vegetables, beans, nuts, seeds, and whole grains. Limit the amount of red meat and sweets that you eat. Talk with your health care provider about whether it is safe for you  to drink red wine in moderation. This means 1 glass a day for nonpregnant women and 2 glasses a day for men. A glass of wine equals 5 oz (150 mL). This information is not intended to replace advice given to you by your health care provider. Make sure you discuss any questions you have with your health care provider. Document Released: 01/29/2016 Document Revised: 03/02/2016 Document Reviewed: 01/29/2016 Elsevier Interactive Patient Education  2017 [employer].

## 2023-05-31 ENCOUNTER — Telehealth: Payer: Self-pay | Admitting: Orthopedic Surgery

## 2023-05-31 DIAGNOSIS — N179 Acute kidney failure, unspecified: Secondary | ICD-10-CM | POA: Diagnosis not present

## 2023-05-31 DIAGNOSIS — N1831 Chronic kidney disease, stage 3a: Secondary | ICD-10-CM | POA: Diagnosis not present

## 2023-05-31 DIAGNOSIS — M80032D Age-related osteoporosis with current pathological fracture, left forearm, subsequent encounter for fracture with routine healing: Secondary | ICD-10-CM | POA: Diagnosis not present

## 2023-05-31 DIAGNOSIS — F339 Major depressive disorder, recurrent, unspecified: Secondary | ICD-10-CM | POA: Diagnosis not present

## 2023-05-31 DIAGNOSIS — I129 Hypertensive chronic kidney disease with stage 1 through stage 4 chronic kidney disease, or unspecified chronic kidney disease: Secondary | ICD-10-CM | POA: Diagnosis not present

## 2023-05-31 DIAGNOSIS — M8008XD Age-related osteoporosis with current pathological fracture, vertebra(e), subsequent encounter for fracture with routine healing: Secondary | ICD-10-CM | POA: Diagnosis not present

## 2023-05-31 LAB — VITAMIN B12: Vitamin B-12: 1303 pg/mL — ABNORMAL HIGH (ref 200–1100)

## 2023-05-31 LAB — TSH: TSH: 3.17 m[IU]/L (ref 0.40–4.50)

## 2023-05-31 NOTE — Telephone Encounter (Signed)
Marcelino Duster from [facility] requesting verbal orders for HHOT 1 Week 5 CB#[phone] Secure line okay to LVM

## 2023-05-31 NOTE — Telephone Encounter (Signed)
I called and gave her verbal Michaela Simpson

## 2023-05-31 NOTE — Progress Notes (Signed)
Blood tests are normal

## 2023-06-01 DIAGNOSIS — M8008XD Age-related osteoporosis with current pathological fracture, vertebra(e), subsequent encounter for fracture with routine healing: Secondary | ICD-10-CM | POA: Diagnosis not present

## 2023-06-01 DIAGNOSIS — M80032D Age-related osteoporosis with current pathological fracture, left forearm, subsequent encounter for fracture with routine healing: Secondary | ICD-10-CM | POA: Diagnosis not present

## 2023-06-01 DIAGNOSIS — F339 Major depressive disorder, recurrent, unspecified: Secondary | ICD-10-CM | POA: Diagnosis not present

## 2023-06-01 DIAGNOSIS — N1831 Chronic kidney disease, stage 3a: Secondary | ICD-10-CM | POA: Diagnosis not present

## 2023-06-01 DIAGNOSIS — N179 Acute kidney failure, unspecified: Secondary | ICD-10-CM | POA: Diagnosis not present

## 2023-06-01 DIAGNOSIS — I129 Hypertensive chronic kidney disease with stage 1 through stage 4 chronic kidney disease, or unspecified chronic kidney disease: Secondary | ICD-10-CM | POA: Diagnosis not present

## 2023-06-02 ENCOUNTER — Ambulatory Visit (HOSPITAL_BASED_OUTPATIENT_CLINIC_OR_DEPARTMENT_OTHER): Payer: Medicare Other | Admitting: Family Medicine

## 2023-06-02 ENCOUNTER — Encounter (HOSPITAL_BASED_OUTPATIENT_CLINIC_OR_DEPARTMENT_OTHER): Payer: Self-pay | Admitting: Family Medicine

## 2023-06-02 ENCOUNTER — Other Ambulatory Visit (HOSPITAL_BASED_OUTPATIENT_CLINIC_OR_DEPARTMENT_OTHER): Payer: Self-pay | Admitting: Family Medicine

## 2023-06-02 DIAGNOSIS — S12190D Other displaced fracture of second cervical vertebra, subsequent encounter for fracture with routine healing: Secondary | ICD-10-CM | POA: Diagnosis not present

## 2023-06-02 DIAGNOSIS — R319 Hematuria, unspecified: Secondary | ICD-10-CM

## 2023-06-02 DIAGNOSIS — E538 Deficiency of other specified B group vitamins: Secondary | ICD-10-CM

## 2023-06-02 DIAGNOSIS — R4189 Other symptoms and signs involving cognitive functions and awareness: Secondary | ICD-10-CM | POA: Diagnosis not present

## 2023-06-02 DIAGNOSIS — F320 Major depressive disorder, single episode, mild: Secondary | ICD-10-CM | POA: Diagnosis not present

## 2023-06-02 HISTORY — DX: Deficiency of other specified B group vitamins: E53.8

## 2023-06-02 HISTORY — DX: Hematuria, unspecified: R31.9

## 2023-06-02 LAB — POCT URINALYSIS DIP (CLINITEK)
Bilirubin, UA: NEGATIVE
Glucose, UA: NEGATIVE mg/dL
Nitrite, UA: NEGATIVE
POC PROTEIN,UA: NEGATIVE
Spec Grav, UA: 1.025 (ref 1.010–1.025)
Urobilinogen, UA: 0.2 U/dL
pH, UA: 5.5 (ref 5.0–8.0)

## 2023-06-02 MED ORDER — CYANOCOBALAMIN 500 MCG PO TABS: 500.0000 ug | ORAL_TABLET | Freq: Every day | ORAL | 0 refills | Status: DC

## 2023-06-02 NOTE — Assessment & Plan Note (Signed)
Has been managing with vitamin B12 supplementation.  On most recent labs, was found to have slightly elevated vitamin B12 level.  Given this, would recommend reducing supplementation dose.  New prescription provided to patient today in order to provide this to living facility to make adjustment in supplementation dose.  We will reduce from 1000 mcg dose down to 500 mcg dose and monitor response

## 2023-06-02 NOTE — Assessment & Plan Note (Signed)
Patient has had evaluation with neurology.  It is felt that patient is likely dealing with mild dementia, likely related to Alzheimer's.  They are wanting to have MRI completed, however this will not be able to be done until she is able to come out of cervical collar which is expected to be within a few months.  Neurology office visit note reviewed today they do have plan to follow-up with neurology in the future for continued monitoring and recommendations.  No other concerns today.

## 2023-06-02 NOTE — Patient Instructions (Signed)
?  Medication Instructions:  ?Your physician recommends that you continue on your current medications as directed. Please refer to the Current Medication list given to you today. ?--If you need a refill on any your medications before your next appointment, please call your pharmacy first. If no refills are authorized on file call the office.-- ? ? ? ?Follow-Up: ?Your next appointment:   ?Your physician recommends that you schedule a follow-up appointment in: 3 month follow-up with Dr. de [place] ? ?You will receive a text message or e-mail with a link to a survey about your care and experience with [place] today! We would greatly appreciate your feedback!  ? ?Thanks for letting us be apart of your health journey!!  ?Primary Care and Sports Medicine  ? ?Dr. Kyung Rudd de [place]  ? ?We encourage you to activate your patient portal called "MyChart".  Sign up information is provided on this After Visit Summary.  MyChart is used to connect with patients for Virtual Visits (Telemedicine).  Patients are able to view lab/test results, encounter notes, upcoming appointments, etc.  Non-urgent messages can be sent to your provider as well. To learn more about what you can do with MyChart, please visit --  [email or web address].    ?

## 2023-06-02 NOTE — Progress Notes (Signed)
    Procedures performed today:    None.  Independent interpretation of notes and tests performed by another provider:   None.  Brief History, Exam, Impression, and Recommendations:    BP 118/72 (BP Location: Left Arm, Patient Position: Sitting, Cuff Size: Normal)   Pulse 79   Ht 5\' 2"  (1.575 m)   Wt 108 lb 11.2 oz (49.3 kg)   SpO2 99%   BMI 19.88 kg/m   Patient presents today for follow-up.  She is accompanied by both daughters.  Current mild episode of major depressive disorder, unspecified whether recurrent (HCC) -     POCT URINALYSIS DIP (CLINITEK)  Hematuria, unspecified type Assessment & Plan: On prior urine testing, she has been noted to have trace hematuria.  Most recent prior urine testing was associated with urinary tract infection.  Follow-up urine testing today does show trace hematuria again on urine dip in office today.  We will send urine for further testing including microscopic assessment as well as urine culture.  She is not currently having any complaints of dysuria, no acute confusion which has been seen with prior urinary tract infections as well. Will follow-up on urine testing to determine if any further evaluation or treatment is required  Orders: -     POCT URINALYSIS DIP (CLINITEK) -     Urinalysis, Routine w reflex microscopic  Other closed displaced fracture of second cervical vertebra with routine healing, subsequent encounter Assessment & Plan: Patient has had follow-up with orthopedic surgeon.  They report that fracture healing is reported to be going well.  She continues with cervical collar.  At most recent appointment with orthopedic surgeon, recommendation was to continue with cervical collar and the plan for follow-up in a few months with repeat x-rays at that time to monitor fracture healing.  No acute concerns today.  Denies any significant pain.  She is looking forward to when she will be able to come out of cervical collar.   Cognitive  changes Assessment & Plan: Patient has had evaluation with neurology.  It is felt that patient is likely dealing with mild dementia, likely related to Alzheimer's.  They are wanting to have MRI completed, however this will not be able to be done until she is able to come out of cervical collar which is expected to be within a few months.  Neurology office visit note reviewed today they do have plan to follow-up with neurology in the future for continued monitoring and recommendations.  No other concerns today.   Vitamin B12 deficiency Assessment & Plan: Has been managing with vitamin B12 supplementation.  On most recent labs, was found to have slightly elevated vitamin B12 level.  Given this, would recommend reducing supplementation dose.  New prescription provided to patient today in order to provide this to living facility to make adjustment in supplementation dose.  We will reduce from 1000 mcg dose down to 500 mcg dose and monitor response   Other orders -     Cyanocobalamin; Take 1 tablet (500 mcg total) by mouth daily.  Dispense: 90 tablet; Refill: 0  Return in about 3 months (around 08/31/2023).   ___________________________________________ Amoni Scallan de [place], MD, ABFM, CAQSM Primary Care and Sports Medicine [facility]

## 2023-06-02 NOTE — Assessment & Plan Note (Signed)
Patient has had follow-up with orthopedic surgeon.  They report that fracture healing is reported to be going well.  She continues with cervical collar.  At most recent appointment with orthopedic surgeon, recommendation was to continue with cervical collar and the plan for follow-up in a few months with repeat x-rays at that time to monitor fracture healing.  No acute concerns today.  Denies any significant pain.  She is looking forward to when she will be able to come out of cervical collar.

## 2023-06-02 NOTE — Assessment & Plan Note (Signed)
On prior urine testing, she has been noted to have trace hematuria.  Most recent prior urine testing was associated with urinary tract infection.  Follow-up urine testing today does show trace hematuria again on urine dip in office today.  We will send urine for further testing including microscopic assessment as well as urine culture.  She is not currently having any complaints of dysuria, no acute confusion which has been seen with prior urinary tract infections as well. Will follow-up on urine testing to determine if any further evaluation or treatment is required

## 2023-06-03 LAB — URINALYSIS, ROUTINE W REFLEX MICROSCOPIC
Bilirubin, UA: NEGATIVE
Glucose, UA: NEGATIVE
Nitrite, UA: NEGATIVE
RBC, UA: NEGATIVE
Specific Gravity, UA: 1.022 (ref 1.005–1.030)
Urobilinogen, Ur: 0.2 mg/dL (ref 0.2–1.0)
pH, UA: 5.5 (ref 5.0–7.5)

## 2023-06-03 LAB — MICROSCOPIC EXAMINATION
Bacteria, UA: NONE SEEN
Casts: NONE SEEN /[LPF]
Epithelial Cells (non renal): NONE SEEN /[HPF] (ref 0–10)
RBC, Urine: NONE SEEN /[HPF] (ref 0–2)

## 2023-06-07 DIAGNOSIS — F339 Major depressive disorder, recurrent, unspecified: Secondary | ICD-10-CM | POA: Diagnosis not present

## 2023-06-07 DIAGNOSIS — M80032D Age-related osteoporosis with current pathological fracture, left forearm, subsequent encounter for fracture with routine healing: Secondary | ICD-10-CM | POA: Diagnosis not present

## 2023-06-07 DIAGNOSIS — I129 Hypertensive chronic kidney disease with stage 1 through stage 4 chronic kidney disease, or unspecified chronic kidney disease: Secondary | ICD-10-CM | POA: Diagnosis not present

## 2023-06-07 DIAGNOSIS — N179 Acute kidney failure, unspecified: Secondary | ICD-10-CM | POA: Diagnosis not present

## 2023-06-07 DIAGNOSIS — N1831 Chronic kidney disease, stage 3a: Secondary | ICD-10-CM | POA: Diagnosis not present

## 2023-06-07 DIAGNOSIS — M8008XD Age-related osteoporosis with current pathological fracture, vertebra(e), subsequent encounter for fracture with routine healing: Secondary | ICD-10-CM | POA: Diagnosis not present

## 2023-06-08 DIAGNOSIS — N179 Acute kidney failure, unspecified: Secondary | ICD-10-CM | POA: Diagnosis not present

## 2023-06-08 DIAGNOSIS — M8008XD Age-related osteoporosis with current pathological fracture, vertebra(e), subsequent encounter for fracture with routine healing: Secondary | ICD-10-CM | POA: Diagnosis not present

## 2023-06-08 DIAGNOSIS — N1831 Chronic kidney disease, stage 3a: Secondary | ICD-10-CM | POA: Diagnosis not present

## 2023-06-08 DIAGNOSIS — F339 Major depressive disorder, recurrent, unspecified: Secondary | ICD-10-CM | POA: Diagnosis not present

## 2023-06-08 DIAGNOSIS — I129 Hypertensive chronic kidney disease with stage 1 through stage 4 chronic kidney disease, or unspecified chronic kidney disease: Secondary | ICD-10-CM | POA: Diagnosis not present

## 2023-06-08 DIAGNOSIS — M80032D Age-related osteoporosis with current pathological fracture, left forearm, subsequent encounter for fracture with routine healing: Secondary | ICD-10-CM | POA: Diagnosis not present

## 2023-06-13 DIAGNOSIS — N179 Acute kidney failure, unspecified: Secondary | ICD-10-CM | POA: Diagnosis not present

## 2023-06-13 DIAGNOSIS — M8008XD Age-related osteoporosis with current pathological fracture, vertebra(e), subsequent encounter for fracture with routine healing: Secondary | ICD-10-CM | POA: Diagnosis not present

## 2023-06-13 DIAGNOSIS — M80032D Age-related osteoporosis with current pathological fracture, left forearm, subsequent encounter for fracture with routine healing: Secondary | ICD-10-CM | POA: Diagnosis not present

## 2023-06-13 DIAGNOSIS — F339 Major depressive disorder, recurrent, unspecified: Secondary | ICD-10-CM | POA: Diagnosis not present

## 2023-06-13 DIAGNOSIS — N1831 Chronic kidney disease, stage 3a: Secondary | ICD-10-CM | POA: Diagnosis not present

## 2023-06-13 DIAGNOSIS — I129 Hypertensive chronic kidney disease with stage 1 through stage 4 chronic kidney disease, or unspecified chronic kidney disease: Secondary | ICD-10-CM | POA: Diagnosis not present

## 2023-06-20 DIAGNOSIS — F339 Major depressive disorder, recurrent, unspecified: Secondary | ICD-10-CM | POA: Diagnosis not present

## 2023-06-20 DIAGNOSIS — N1831 Chronic kidney disease, stage 3a: Secondary | ICD-10-CM | POA: Diagnosis not present

## 2023-06-20 DIAGNOSIS — N179 Acute kidney failure, unspecified: Secondary | ICD-10-CM | POA: Diagnosis not present

## 2023-06-20 DIAGNOSIS — M80032D Age-related osteoporosis with current pathological fracture, left forearm, subsequent encounter for fracture with routine healing: Secondary | ICD-10-CM | POA: Diagnosis not present

## 2023-06-20 DIAGNOSIS — M8008XD Age-related osteoporosis with current pathological fracture, vertebra(e), subsequent encounter for fracture with routine healing: Secondary | ICD-10-CM | POA: Diagnosis not present

## 2023-06-20 DIAGNOSIS — I129 Hypertensive chronic kidney disease with stage 1 through stage 4 chronic kidney disease, or unspecified chronic kidney disease: Secondary | ICD-10-CM | POA: Diagnosis not present

## 2023-06-21 ENCOUNTER — Other Ambulatory Visit (HOSPITAL_BASED_OUTPATIENT_CLINIC_OR_DEPARTMENT_OTHER): Payer: Self-pay | Admitting: Family Medicine

## 2023-06-21 DIAGNOSIS — F320 Major depressive disorder, single episode, mild: Secondary | ICD-10-CM

## 2023-06-23 ENCOUNTER — Encounter (HOSPITAL_BASED_OUTPATIENT_CLINIC_OR_DEPARTMENT_OTHER): Payer: Self-pay | Admitting: Family Medicine

## 2023-06-23 DIAGNOSIS — N179 Acute kidney failure, unspecified: Secondary | ICD-10-CM | POA: Diagnosis not present

## 2023-06-23 DIAGNOSIS — I350 Nonrheumatic aortic (valve) stenosis: Secondary | ICD-10-CM | POA: Diagnosis not present

## 2023-06-23 DIAGNOSIS — M8008XD Age-related osteoporosis with current pathological fracture, vertebra(e), subsequent encounter for fracture with routine healing: Secondary | ICD-10-CM | POA: Diagnosis not present

## 2023-06-23 DIAGNOSIS — R627 Adult failure to thrive: Secondary | ICD-10-CM | POA: Diagnosis not present

## 2023-06-23 DIAGNOSIS — F339 Major depressive disorder, recurrent, unspecified: Secondary | ICD-10-CM | POA: Diagnosis not present

## 2023-06-23 DIAGNOSIS — E46 Unspecified protein-calorie malnutrition: Secondary | ICD-10-CM | POA: Diagnosis not present

## 2023-06-23 DIAGNOSIS — M80032D Age-related osteoporosis with current pathological fracture, left forearm, subsequent encounter for fracture with routine healing: Secondary | ICD-10-CM | POA: Diagnosis not present

## 2023-06-23 DIAGNOSIS — Z9181 History of falling: Secondary | ICD-10-CM | POA: Diagnosis not present

## 2023-06-23 DIAGNOSIS — I7121 Aneurysm of the ascending aorta, without rupture: Secondary | ICD-10-CM | POA: Diagnosis not present

## 2023-06-23 DIAGNOSIS — I129 Hypertensive chronic kidney disease with stage 1 through stage 4 chronic kidney disease, or unspecified chronic kidney disease: Secondary | ICD-10-CM | POA: Diagnosis not present

## 2023-06-23 DIAGNOSIS — N1831 Chronic kidney disease, stage 3a: Secondary | ICD-10-CM | POA: Diagnosis not present

## 2023-06-23 DIAGNOSIS — R296 Repeated falls: Secondary | ICD-10-CM | POA: Diagnosis not present

## 2023-06-23 DIAGNOSIS — M069 Rheumatoid arthritis, unspecified: Secondary | ICD-10-CM | POA: Diagnosis not present

## 2023-06-23 DIAGNOSIS — I739 Peripheral vascular disease, unspecified: Secondary | ICD-10-CM | POA: Diagnosis not present

## 2023-06-23 DIAGNOSIS — Z7902 Long term (current) use of antithrombotics/antiplatelets: Secondary | ICD-10-CM | POA: Diagnosis not present

## 2023-06-23 DIAGNOSIS — B9620 Unspecified Escherichia coli [E. coli] as the cause of diseases classified elsewhere: Secondary | ICD-10-CM | POA: Diagnosis not present

## 2023-06-23 DIAGNOSIS — N390 Urinary tract infection, site not specified: Secondary | ICD-10-CM | POA: Diagnosis not present

## 2023-06-24 DIAGNOSIS — M80032D Age-related osteoporosis with current pathological fracture, left forearm, subsequent encounter for fracture with routine healing: Secondary | ICD-10-CM | POA: Diagnosis not present

## 2023-06-24 DIAGNOSIS — N1831 Chronic kidney disease, stage 3a: Secondary | ICD-10-CM | POA: Diagnosis not present

## 2023-06-24 DIAGNOSIS — I129 Hypertensive chronic kidney disease with stage 1 through stage 4 chronic kidney disease, or unspecified chronic kidney disease: Secondary | ICD-10-CM | POA: Diagnosis not present

## 2023-06-24 DIAGNOSIS — F339 Major depressive disorder, recurrent, unspecified: Secondary | ICD-10-CM | POA: Diagnosis not present

## 2023-06-24 DIAGNOSIS — N179 Acute kidney failure, unspecified: Secondary | ICD-10-CM | POA: Diagnosis not present

## 2023-06-24 DIAGNOSIS — M8008XD Age-related osteoporosis with current pathological fracture, vertebra(e), subsequent encounter for fracture with routine healing: Secondary | ICD-10-CM | POA: Diagnosis not present

## 2023-06-27 ENCOUNTER — Encounter: Payer: Self-pay | Admitting: Orthopedic Surgery

## 2023-06-29 ENCOUNTER — Encounter (HOSPITAL_BASED_OUTPATIENT_CLINIC_OR_DEPARTMENT_OTHER): Payer: Self-pay | Admitting: Family Medicine

## 2023-06-29 ENCOUNTER — Telehealth (INDEPENDENT_AMBULATORY_CARE_PROVIDER_SITE_OTHER): Payer: Medicare Other | Admitting: Family Medicine

## 2023-06-29 DIAGNOSIS — R4189 Other symptoms and signs involving cognitive functions and awareness: Secondary | ICD-10-CM

## 2023-06-29 NOTE — Assessment & Plan Note (Signed)
 Patient continues to live at an assisted living facility.  This primarily was related to issues with falls and sustaining fracture of cervical vertebrae that has resolved of the falls.  Due to this, she has been in a cervical collar.  Primary concerns at this time are related to gradual decline in cognitive function as well as independence with daily activities.  Family does do have concerns given these changes and patient's insistence on returning home once she is cleared by orthopedic surgery to have cervical collar removed.  During virtual visit, did have discussion with patient's family including her 2 daughters who do typically come to appointments with her as well as her son-in-law who is a retired development worker, community.  They do have concerns about patient's safety related to her desires to return home and they have discussed with her that she would not be able to return home even once cleared by orthopedic surgeon.  Patient has been resistant to these discussions. She has had prior evaluation with neurology with notable cognitive changes observed on testing with suspicion for dementia, likely Alzheimer's dementia.  Plan was for MRI, however this was being held until patient was able to discontinue cervical collar.  It does seem that patient had some skepticism regarding neurology recommendations as she met with physician assistant at last appointment. Due to ongoing concerns, recommend arranging in-office visit to discuss concerns with patient face-to-face and review recommendations regarding living situation and importance of maintaining patient in safest environment for her.  Daughter is concerned that patient will not be as receptive to recommendations and in particular not being able to return to living independently.  Additional aspects of discussion to review during in-office visit include use of walker/assistive device for ambulation, inability to return to driving and ultimately discussing inability to live  independently and needing to have regular supervision available.

## 2023-06-29 NOTE — Progress Notes (Signed)
 Virtual Visit   I connected with  Michaela Simpson  on 06/29/23 by telehealth and verified that I am speaking with the correct person using two identifiers. Visit completed via video.   I discussed the limitations, risks, security and privacy concerns of performing an evaluation and management service by telephone, including the higher likelihood of inaccurate diagnosis and treatment, and the availability of in person appointments.  We also discussed the likely need of an additional face to face encounter for complete and high quality delivery of care.  I also discussed with the patient that there may be a patient responsible charge related to this service. The patient expressed understanding and wishes to proceed.  Provider location is in medical facility. Patient location is at their home, different from provider location. People involved in care of the patient during this telehealth encounter were myself, my nurse/medical assistant, and my front office/scheduling team member.  Review of Systems: No fevers, chills, night sweats, weight loss, chest pain, or shortness of breath.   Objective Findings:    General: Speaking full sentences, no audible heavy breathing.  Sounds alert and appropriately interactive.    Independent interpretation of tests performed by another provider:   None.  Brief History, Exam, Impression, and Recommendations:    Cognitive changes Patient continues to live at an assisted living facility.  This primarily was related to issues with falls and sustaining fracture of cervical vertebrae that has resolved of the falls.  Due to this, she has been in a cervical collar.  Primary concerns at this time are related to gradual decline in cognitive function as well as independence with daily activities.  Family does do have concerns given these changes and patient's insistence on returning home once she is cleared by orthopedic surgery to have cervical collar removed.   During virtual visit, did have discussion with patient's family including her 2 daughters who do typically come to appointments with her as well as her son-in-law who is a retired development worker, community.  They do have concerns about patient's safety related to her desires to return home and they have discussed with her that she would not be able to return home even once cleared by orthopedic surgeon.  Patient has been resistant to these discussions. She has had prior evaluation with neurology with notable cognitive changes observed on testing with suspicion for dementia, likely Alzheimer's dementia.  Plan was for MRI, however this was being held until patient was able to discontinue cervical collar.  It does seem that patient had some skepticism regarding neurology recommendations as she met with physician assistant at last appointment. Due to ongoing concerns, recommend arranging in-office visit to discuss concerns with patient face-to-face and review recommendations regarding living situation and importance of maintaining patient in safest environment for her.  Daughter is concerned that patient will not be as receptive to recommendations and in particular not being able to return to living independently.  Additional aspects of discussion to review during in-office visit include use of walker/assistive device for ambulation, inability to return to driving and ultimately discussing inability to live independently and needing to have regular supervision available.  I discussed the above assessment and treatment plan with the patient. The patient was provided an opportunity to ask questions and all were answered. The patient agreed with the plan and demonstrated an understanding of the instructions.  The patient was advised to call back or seek an in-person evaluation if the symptoms worsen or if the condition fails to improve  as anticipated.  I provided 20 minutes of face to face and non-face-to-face time during this  encounter date, time was needed to gather information, review chart, records, communicate/coordinate with staff remotely, as well as complete documentation.   ___________________________________________ Enyah Moman de [place], MD, ABFM, CAQSM Primary Care and Sports Medicine [facility]

## 2023-07-05 ENCOUNTER — Ambulatory Visit (INDEPENDENT_AMBULATORY_CARE_PROVIDER_SITE_OTHER): Payer: Medicare Other | Admitting: Family Medicine

## 2023-07-05 DIAGNOSIS — R4189 Other symptoms and signs involving cognitive functions and awareness: Secondary | ICD-10-CM

## 2023-07-05 NOTE — Progress Notes (Signed)
    Procedures performed today:    None.  Independent interpretation of notes and tests performed by another provider:   None.  Brief History, Exam, Impression, and Recommendations:    Cognitive changes Assessment & Plan: Again reviewed concerns today with patient's daughters who presented to office today.  Discussed concerns related to cognitive changes for patient and concerns related to ability to live independently including caring for self as well as transportation concerns.  Concerns as outlined in note from visit on January 8 were again reviewed.  We discussed possible resources.  Previously referral was placed for further cognitive evaluation and this is in the process of being arranged.  Given concerns, we will also reach out to care management to assist with navigating care concerns.  Orders: -     AMB Referral VBCI Care Management   ___________________________________________ Juliene Kirsh de [place], MD, ABFM, CAQSM Primary Care and Sports Medicine [facility]

## 2023-07-06 ENCOUNTER — Telehealth: Payer: Self-pay | Admitting: *Deleted

## 2023-07-06 NOTE — Progress Notes (Signed)
Complex Care Management Note  Care Guide Note 07/06/2023 Name: EMMELIE CHMELIK MRN: [id number] DOB: 1931-12-08  Kirby Funk Thibaudeau is a 89 y.o. year old female who sees de [place], Buren Kos, MD for primary care. I reached out to Sheppard Coil by phone today to offer complex care management services.  Ms. Boomsma was given information about Complex Care Management services today including:   The Complex Care Management services include support from the care team which includes your Nurse Coordinator, Clinical Social Worker, or Pharmacist.  The Complex Care Management team is here to help remove barriers to the health concerns and goals most important to you. Complex Care Management services are voluntary, and the patient may decline or stop services at any time by request to their care team member.   Complex Care Management Consent Status: Patient agreed to services and verbal consent obtained.     Encounter Outcome:  Patient Request to Call Back  Gwenevere Ghazi  [facility]  [facility], [facility] Guide  Direct Dial: [phone]  Fax [phone]

## 2023-07-07 ENCOUNTER — Ambulatory Visit: Payer: Medicare Other | Admitting: Orthopedic Surgery

## 2023-07-07 ENCOUNTER — Other Ambulatory Visit (INDEPENDENT_AMBULATORY_CARE_PROVIDER_SITE_OTHER): Payer: Medicare Other

## 2023-07-07 DIAGNOSIS — M542 Cervicalgia: Secondary | ICD-10-CM

## 2023-07-07 DIAGNOSIS — S12190A Other displaced fracture of second cervical vertebra, initial encounter for closed fracture: Secondary | ICD-10-CM

## 2023-07-07 NOTE — Progress Notes (Signed)
Orthopedic Spine Surgery Office Note   Assessment: Patient is a 89 y.o. female with C2 lateral mass fracture with intra-articular extension that is minimally displaced.  Not having any neck pain. No displacement seen on x-ray     Plan: -No operative intervention planned at this time -No spine specific precautions, discontinue collar at this visit -Told her she may notice soreness in her neck from coming out of the collar and could use it for a few hours a day if that does occur but should gradually wean out of it over the next month -Patient should return to office in 6 weeks, x-rays at next visit: AP/lateral/flex/ex/open-mouth cervical x-rays     Patient expressed understanding of the plan and all questions were answered to the patient's satisfaction.    ___________________________________________________________________________     History:   Patient is a 89 y.o. female who presents today for follow up on her cervical spine.  Patient is been doing well since she was last seen.  She has adjusted to using the collar.  She is not having any pain in her neck.  She is no longer having any pain radiating into either upper extremity.   Physical Exam:   General: no acute distress, appears stated age Neurologic: alert, answering questions appropriately, following commands Respiratory: unlabored breathing on room air, symmetric chest rise Psychiatric: appropriate affect, normal cadence to speech     MSK (spine):   -Strength exam                                                   Left                  Right Grip strength                5/5                  5/5 Interosseus                  5/5                  5/5 Wrist extension            5/5                  5/5 Wrist flexion                 5/5                  5/5 Elbow flexion                5/5                  5/5 Deltoid                          5/5                  5/5   -Sensory exam                             Sensation  intact to light touch in C5-T1 nerve distributions of bilateral upper extremities   -Brachioradialis DTR: 2/4 on the left, 2/4 on the right -Biceps DTR: 2/4  on the left, 2/4 on the right   -Hoffman sign: Negative bilaterally -Clonus: No beats bilaterally -Interosseous wasting: None seen -Grip and release test: Negative -Gait: Slow but normal without assistive devices  -No tenderness to palpation over the midline cervical spine -No pain with rotation to 50 degrees in either direction or with flexion/extension at the neck     Imaging: XRs of the cervical spine from 07/07/2023 were independently reviewed and interpreted, showing degenerative changes throughout the cervical spine.  There is disc height loss and anterior osteophyte formation at every level.  Positive sagittal balance.  C1/2 joint superior well aligned.  No displacement seen on the open-mouth view.  No new fracture or dislocation seen.      Patient name: Michaela Simpson Patient MRN: [id number] Date of visit: 07/07/23

## 2023-07-07 NOTE — Progress Notes (Signed)
Complex Care Management Note Care Guide Note  07/07/2023 Name: Michaela Simpson MRN: [id number] DOB: [birth year]   Complex Care Management Outreach Attempts: An unsuccessful telephone outreach was attempted today to offer the patient information about available complex care management services.  Follow Up Plan:  Additional outreach attempts will be made to offer the patient complex care management information and services.   Encounter Outcome:  Patient Request to Call Back  Gwenevere Ghazi  [facility]  [facility], [facility] Guide  Direct Dial: [phone]  Fax [phone]

## 2023-07-07 NOTE — Progress Notes (Signed)
Complex Care Management Note  Care Guide Note 07/07/2023 Name: Michaela Simpson MRN: [id number] DOB: 29-Dec-1931  Michaela Simpson is a 89 y.o. year old female who sees de [place], Buren Kos, MD for primary care. I reached out to Sheppard Coil by phone today to offer complex care management services.  Michaela Simpson was given information about Complex Care Management services today including:   The Complex Care Management services include support from the care team which includes your Nurse Coordinator, Clinical Social Worker, or Pharmacist.  The Complex Care Management team is here to help remove barriers to the health concerns and goals most important to you. Complex Care Management services are voluntary, and the patient may decline or stop services at any time by request to their care team member.   Complex Care Management Consent Status: Patient agreed to services and verbal consent obtained.   Follow up plan:  Telephone appointment with complex care management team member scheduled for:  07/13/23  Encounter Outcome:  Patient Scheduled  Gwenevere Ghazi  [facility]  [facility], [facility] Guide  Direct Dial: [phone]  Fax [phone]

## 2023-07-08 ENCOUNTER — Telehealth: Payer: Self-pay | Admitting: Physician Assistant

## 2023-07-08 ENCOUNTER — Other Ambulatory Visit: Payer: Self-pay

## 2023-07-08 DIAGNOSIS — R413 Other amnesia: Secondary | ICD-10-CM

## 2023-07-08 NOTE — Addendum Note (Signed)
Addended by: Allean Found R on: 07/08/2023 01:50 PM   Modules accepted: Orders

## 2023-07-08 NOTE — Telephone Encounter (Signed)
Will call patient, ordered CT head

## 2023-07-08 NOTE — Progress Notes (Signed)
Order CT brain

## 2023-07-08 NOTE — Telephone Encounter (Signed)
Daughter Michaela Simpson) cld MRI sign off needed from Dr. Gwynneth Munson, Pt had collar neck brace and was not able to have MRI previously. Would like to speak with staff for next steps.

## 2023-07-08 NOTE — Telephone Encounter (Signed)
Mri is now ordered fyi.She was cleared to have done, so reordered it

## 2023-07-13 ENCOUNTER — Ambulatory Visit: Payer: Self-pay | Admitting: Licensed Clinical Social Worker

## 2023-07-13 NOTE — Patient Outreach (Signed)
  Care Coordination   Initial Visit Note   07/13/2023 Name: GRISELA KIDNEY MRN: [id number] DOB: 1932-06-20  Kirby Funk Sistare is a 89 y.o. year old female who sees de [place], Buren Kos, MD for primary care. I spoke with  Sheppard Coil by phone today.  What matters to the patients health and wellness today?  Pt daughter requested information regarding cognitive testing and scheduling with [facility]. Pt daughter encouraged to contact [facility] regarding specific concerns.     Goals Addressed             This Visit's Progress    Care Coordination       Communicate with mother's medical doctors regarding concerns of pt living alone.  Contact [facility] regarding testing and scheduling concerns. Additionally contact [facility] for desire alternate assistance per pt daughter request.         SDOH assessments and interventions completed:  Yes  SDOH Interventions Today    Flowsheet Row Most Recent Value  SDOH Interventions   Food Insecurity Interventions Patient Unable to Answer  [LCSW A. Felton Clinton unable to asked pt due currently residing in an assisted living facility]  Housing Interventions Other (Comment)  [LCSW A. Felton Clinton unable to asked pt due currently residing in an assisted living facility]  Transportation Interventions Patient Unable to Answer  [LCSW A. Felton Clinton unable to asked pt due currently residing in an assisted living facility]  Utilities Interventions Other (Comment)  [LCSW A. Felton Clinton unable to asked pt due currently residing in an assisted living facility]        Care Coordination Interventions:  Yes, provided  Interventions Today    Flowsheet Row Most Recent Value  Education Interventions   Education Provided Provided Education  Provided Verbal Education On Other  [Advise pt daughter for alternate neurologist per request. Advise pt daughter to contact [facility] or [facility] regarding scheduling and testing concerns.]   Safety Interventions   Safety Discussed/Reviewed Safety Discussed  [Encourage pt daughter to spk with mother's medical doctors regarding concerns of allowing pt to live alone per pt request.]       Follow up plan: No further intervention required.   Encounter Outcome:  Patient Visit Completed   Gwyndolyn Saxon MSW, LCSW Licensed Clinical Social Worker  [facility], Population Health Direct Dial: [phone]  Fax: [phone]

## 2023-07-13 NOTE — Patient Instructions (Signed)
Visit Information  Thank you for taking time to visit with me today. Please don't hesitate to contact me if I can be of assistance to you.   Following are the goals we discussed today:   Goals Addressed             This Visit's Progress    Care Coordination       Communicate with mother's medical doctors regarding concerns of pt living alone.  Contact [facility] regarding testing and scheduling concerns. Additionally contact [facility] for desire alternate assistance per pt daughter request.         Please call the care guide team at [phone] if you need to cancel or reschedule your appointment.   If you are experiencing a Mental Health or Behavioral Health Crisis or need someone to talk to, please call 911   Patient verbalizes understanding of instructions and care plan provided today and agrees to view in MyChart. Active MyChart status and patient understanding of how to access instructions and care plan via MyChart confirmed with patient.     The patient has been provided with contact information for the care management team and has been advised to call with any health related questions or concerns.   Gwyndolyn Saxon MSW, LCSW Licensed Clinical Social Worker  [facility], Population Health Direct Dial: [phone]  Fax: [phone]

## 2023-07-14 ENCOUNTER — Other Ambulatory Visit: Payer: Self-pay | Admitting: Physician Assistant

## 2023-07-14 DIAGNOSIS — R413 Other amnesia: Secondary | ICD-10-CM

## 2023-07-18 ENCOUNTER — Encounter: Payer: Self-pay | Admitting: Psychology

## 2023-07-18 ENCOUNTER — Ambulatory Visit: Payer: Medicare Other

## 2023-07-18 ENCOUNTER — Ambulatory Visit (INDEPENDENT_AMBULATORY_CARE_PROVIDER_SITE_OTHER): Payer: Medicare Other | Admitting: Psychology

## 2023-07-18 DIAGNOSIS — R4189 Other symptoms and signs involving cognitive functions and awareness: Secondary | ICD-10-CM

## 2023-07-18 DIAGNOSIS — F03A0 Unspecified dementia, mild, without behavioral disturbance, psychotic disturbance, mood disturbance, and anxiety: Secondary | ICD-10-CM | POA: Insufficient documentation

## 2023-07-18 DIAGNOSIS — F03A3 Unspecified dementia, mild, with mood disturbance: Secondary | ICD-10-CM

## 2023-07-18 HISTORY — DX: Unspecified dementia, mild, without behavioral disturbance, psychotic disturbance, mood disturbance, and anxiety: F03.A0

## 2023-07-18 NOTE — Progress Notes (Signed)
   Psychometrician Note   Cognitive testing was administered to Sheppard Coil by Shan Levans, B.S. (psychometrist) under the supervision of Dr. Newman Nickels, Ph.D., licensed psychologist on 07/18/2023. Ms. Knotek did not appear overtly distressed by the testing session per behavioral observation or responses across self-report questionnaires. Rest breaks were offered.    The battery of tests administered was selected by Dr. Newman Nickels, Ph.D. with consideration to Ms. Lucy current level of functioning, the nature of her symptoms, emotional and behavioral responses during interview, level of literacy, observed level of motivation/effort, and the nature of the referral question. This battery was communicated to the psychometrist. Communication between Dr. Newman Nickels, Ph.D. and the psychometrist was ongoing throughout the evaluation and Dr. Newman Nickels, Ph.D. was immediately accessible at all times. Dr. Newman Nickels, Ph.D. provided supervision to the psychometrist on the date of this service to the extent necessary to assure the quality of all services provided.    Sheppard Coil will return within approximately 1-2 weeks for an interactive feedback session with Dr. Milbert Coulter at which time her test performances, clinical impressions, and treatment recommendations will be reviewed in detail. Ms. Koble understands she can contact our office should she require our assistance before this time.  A total of 110 minutes of billable time were spent face-to-face with Ms. Pitzer by the psychometrist. This includes both test administration and scoring time. Billing for these services is reflected in the clinical report generated by Dr. Newman Nickels, Ph.D.  This note reflects time spent with the psychometrician and does not include test scores or any clinical interpretations made by Dr. Milbert Coulter. The full report will follow in a separate note.

## 2023-07-18 NOTE — Progress Notes (Signed)
NEUROPSYCHOLOGICAL EVALUATION [place] [place] Department of Neurology  Date of Evaluation: July 18, 2023  Reason for Referral:   Michaela Simpson is a 89 y.o. right-handed Caucasian female referred by Marlowe Kays, PA-C, to characterize her current cognitive functioning and assist with diagnostic clarity and treatment planning in the context of subjective cognitive decline.   Assessment and Plan:   Clinical Impression(s): Michaela Simpson pattern of performance is suggestive of isolated impairments surrounding cognitive flexibility, semantic fluency, and clock drawing. Further weakness was exhibited across encoding (i.e., learning) and delayed retrieval aspects of memory. Variability was exhibited across processing speed, confrontation naming, and non-clock drawing visuospatial abilities. Performances were appropriate relative to age-matched peers across basic attention, abstract reasoning, receptive language, phonemic fluency, and recognition/consolidation aspects of memory. Functionally, Michaela Simpson no longer drives and family has taken over financial management and bill paying responsibilities. As she has been in an assisted living facility since her October fall, this facility has been managing medications. Given cognitive impairment and the likelihood that this is interfering with daily functioning to at least some degree, I feel that she best meets diagnostic criteria for a Major Neurocognitive Disorder ("dementia"). She would be towards the mild end of this spectrum presently.   The etiology for her mild dementia presentation remains unclear. With that being said, I do have some concern for an underlying neurodegenerative illness, namely Alzheimer's disease. Across memory testing, Michaela Simpson had notable difficulty recalling information she had previously learned after a fairly brief delay. This raises concerns for rapid forgetting, which is one of the  hallmark testing patterns for this illness. Further impairment/weakness surrounding semantic fluency, confrontation naming, clock drawing, and cognitive flexibility would follow typical disease progression. With that being said, she did show benefit from cueing across all memory tasks. This is certainly encouraging and would not suggest a prominent storage impairment at the current time. Overall, should Alzheimer's disease truly be present, it would appear to be in early stages. Continued monitoring will be very important moving forward.   Regarding judgment concerns, Michaela Simpson overall performance on a related task was in the below average range. While the majority of lost points were due to the provision of overly vague responses, one concerning response did stick out from the others. When she was asked what should she do if she were to take too much of a prescription medication, she provided a very concrete response, stating "get dizzy or faint" and that the "medication wouldn't work right." Notably, she did not suggest any immediate action like calling 911 or conceptualize the possibility of this being a life threatening emergency.   Recommendations: A repeat neuropsychological evaluation in 12-18 months (or sooner if functional decline is noted) could be considered to assess the trajectory of future cognitive decline should it occur. This will also aid in future efforts towards improved diagnostic clarity.  If desired, Michaela Simpson could discuss medication aimed to address memory loss and concerns surrounding Alzheimer's disease with Michaela Simpson. It is important to highlight that these medications have been shown to slow functional decline in some individuals. There is no current treatment which can stop or reverse cognitive decline when caused by a neurodegenerative illness.   She reported acute symptoms of mild depression and moderate anxiety across mood-related questionnaires. She is  encouraged to speak with her prescribing physician regarding medication adjustments to optimally manage these symptoms.  Her daughter specifically asked for recommendations surrounding independent living. In my opinion, Michaela Simpson pattern  of frequent falling and history of not disclosing important medical events to family after they have happened (e.g., she waited four days after falling and sustaining a cervical fracture to let family know and request assistance) is equally as concerning as current performances across cognitive testing. Continued residence in an assisted living facility like where she is currently at remains most prudent. If she does insist on leaving this facility and returning to independent living, she would require at-home caregivers to provide regular assistance throughout the daytime and evening hours to limit the risk of future falls and other adverse health outcomes.  It will be important for Michaela Simpson to have another person with her when in situations where she may need to process information, weigh the pros and cons of different options, and make decisions, in order to ensure that she fully understands and recalls all information to be considered.  If not already done, Michaela Simpson and her family may want to discuss her wishes regarding durable power of attorney and medical decision making, so that she can have input into these choices. If they require legal assistance with this, long-term care resource access, or other aspects of estate planning, they could reach out to [employer] at [phone] for a free consultation.   Ms. Conde is encouraged to attend to lifestyle factors for brain health (e.g., regular physical exercise, good nutrition habits and consideration of the MIND-DASH diet, regular participation in cognitively-stimulating activities, and general stress management techniques), which are likely to have benefits for both emotional adjustment and  cognition. In fact, in addition to promoting good general health, regular exercise incorporating aerobic activities (e.g., brisk walking, jogging, cycling, etc.) has been demonstrated to be a very effective treatment for depression and stress, with similar efficacy rates to both antidepressant medication and psychotherapy. Optimal control of vascular risk factors (including safe cardiovascular exercise and adherence to dietary recommendations) is encouraged. Continued participation in activities which provide mental stimulation and social interaction is also recommended.   Important information should be provided to Michaela Simpson in written format in all instances. This information should be placed in a highly frequented and easily visible location within her home to promote recall. External strategies such as written notes in a consistently used memory journal, visual and nonverbal auditory cues such as a calendar on the refrigerator or appointments with alarm, such as on a cell phone, can also help maximize recall.  When learning new information, she would benefit from information being broken up into small, manageable pieces. She may also find it helpful to articulate the material in her own words and in a context to promote encoding at the onset of a new task. This material may need to be repeated multiple times to promote encoding. She will likely understand and retain new information better if it is presented to her in a meaningful or well-organized manner at the outset, such as grouping items into meaningful categories or presenting information in an outlined, bulleted, or story format.  To address problems with processing speed, she may wish to consider:   -Ensuring that she is alerted when essential material or instructions are being presented   -Adjusting the speed at which new information is presented   -Allowing for more time in comprehending, processing, and responding in conversation    -Repeating and paraphrasing instructions or conversations aloud  To address problems with fluctuating attention and/or executive dysfunction, she may wish to consider:   -Avoiding external distractions when needing to concentrate   -  Limiting exposure to fast paced environments with multiple sensory demands   -Writing down complicated information and using checklists   -Attempting and completing one task at a time (i.e., no multi-tasking)   -Verbalizing aloud each step of a task to maintain focus   -Taking frequent breaks during the completion of steps/tasks to avoid fatigue   -Reducing the amount of information considered at one time   -Scheduling more difficult activities for a time of day where she is usually most alert  Review of Records:   Michaela Simpson was seen by [facility] Marlowe Kays, PA-C) on 05/30/2023 for an evaluation of memory loss. At that time, difficulties were said to surround generalized short-term memory dysfunction (e.g., trouble recalling names or details of recent conversations). Her daughter also noted some increased repetition in day-to-day conversation. Functionally, family manages finances and bill paying and Michaela Simpson no longer drives. She is currently within an assisted living facility and staff provide assistance with medication management. Performance on a brief cognitive screening instrument (MOCA) was 17/30. Ultimately, Michaela Simpson was referred for a comprehensive neuropsychological evaluation to characterize her cognitive abilities and to assist with diagnostic clarity and treatment planning.   Neuroimaging: Head CT on 09/19/2019 in the context of a fall with head trauma was negative. It did suggest mild microvascular ischemic disease. Head CT on 04/09/2023 in the context of another fall was negative.   Past Medical History:  Diagnosis Date   Acute blood loss anemia 03/22/2015   AKI (acute kidney injury)    Anemia    Aortic atherosclerosis  04/26/2023   Bilateral carpal tunnel syndrome 05/15/2018   C2 cervical fracture 04/20/2023   C7 cervical fracture 04/26/2023   Chronic constipation 03/15/2019   Closed left humeral fracture 03/08/2019   Closed right hip fracture (HCC) 03/14/2015   Early cataract    Essential hypertension, benign 03/15/2019   Fracture of femoral neck, left, closed (HCC) 03/08/2019   Headache    Hematuria 06/02/2023   Humerus fracture    Hypomagnesemia 04/26/2023   Insomnia 03/03/2021   Left radial fracture 04/26/2023   Leukocytosis    Major depressive disorder 03/15/2019   Osteonecrosis    post-traumatic osteonecrosis of the femoral head of the left hip, retained orthopedic hardware ( order for conset)   Osteoporosis    Peripheral vascular disease    blood clot right leg   Protein-calorie malnutrition, severe 09/21/2019   PVD (peripheral vascular disease) 04/26/2023   Recurrent falls 04/26/2023   No definite cardiac or neurologic prodrome described.  Most recent falls have resulted in displaced fracture of C2 , nondisplaced fracture of C7 as well as a left wrist fracture for which brace was recommended.  SNF placement for rehab has been pursued.     Rheumatoid arthritis 03/14/2015   S/P TAVR (transcatheter aortic valve replacement) 08/08/2019   Sequelae of open wound of right lower extremity 03/15/2019   Severe aortic stenosis 07/19/2019   Thoracic ascending aortic aneurysm 04/26/2023   4.4 cm by 07/26/19 CTA   Traumatic arthritis of left hip 10/02/2019   Ulcer of lower extremity    Vitamin B12 deficiency 06/02/2023   Wears glasses     Past Surgical History:  Procedure Laterality Date   ABDOMINAL AORTOGRAM W/LOWER EXTREMITY N/A 05/08/2019   Procedure: ABDOMINAL AORTOGRAM W/LOWER EXTREMITY;  Surgeon: Nada Libman, MD;  Location: [facility] INVASIVE CV LAB;  Service: Cardiovascular;  Laterality: N/A;   CATARACT EXTRACTION W/PHACO Left 10/24/2020   Procedure: CATARACT EXTRACTION PHACO AND  INTRAOCULAR  LENS PLACEMENT LEFT EYE;  Surgeon: Fabio Pierce, MD;  Location: [facility] ORS;  Service: Ophthalmology;  Laterality: Left;  CDE  20.05   CATARACT EXTRACTION W/PHACO Right 11/07/2020   Procedure: CATARACT EXTRACTION PHACO AND INTRAOCULAR LENS PLACEMENT RIGHT EYE;  Surgeon: Fabio Pierce, MD;  Location: [facility] OR[facility];  Service: Ophthalmology;  Laterality: Right;  right, CDE=24.26   HARDWARE REMOVAL Left 10/02/2019   Procedure: HARDWARE REMOVAL;  Surgeon: Kathryne Hitch, MD;  Location: [facility] OR;  Service: Orthopedics;  Laterality: Left;   HIP PINNING,CANNULATED Left 03/09/2019   Procedure: CANNULATED HIP PINNING;  Surgeon: Kathryne Hitch, MD;  Location: [facility] OR;  Service: Orthopedics;  Laterality: Left;   INTRAMEDULLARY (IM) NAIL INTERTROCHANTERIC Right 03/14/2015   Procedure: RIGHT INTERTROCHANTRIC INTRAMEDULLARY (IM) NAIL ;  Surgeon: Kathryne Hitch, MD;  Location: [facility] OR;  Service: Orthopedics;  Laterality: Right;   PERIPHERAL VASCULAR INTERVENTION Right 05/08/2019   Procedure: PERIPHERAL VASCULAR INTERVENTION;  Surgeon: Nada Libman, MD;  Location: [facility];  Service: Cardiovascular;  Laterality: Right;   RIGHT/LEFT HEART CATH AND CORONARY ANGIOGRAPHY N/A 07/19/2019   Procedure: RIGHT/LEFT HEART CATH AND CORONARY ANGIOGRAPHY;  Surgeon: Tonny Bollman, MD;  Location: [facility] INVASIVE CV LAB;  Service: Cardiovascular;  Laterality: N/A;   TEE WITHOUT CARDIOVERSION N/A 08/07/2019   Procedure: TRANSESOPHAGEAL ECHOCARDIOGRAM (TEE);  Surgeon: Tonny Bollman, MD;  Location: [facility] INVASIVE CV LAB;  Service: Open Heart Surgery;  Laterality: N/A;   TOTAL HIP ARTHROPLASTY Left 10/02/2019   Procedure: LEFT TOTAL HIP ARTHROPLASTY ANTERIOR APPROACH AND REMOVAL OF CANNULATED SCREWS OF LEFT HIP;  Surgeon: Kathryne Hitch, MD;  Location: [facility] OR;  Service: Orthopedics;  Laterality: Left;   TRANSCATHETER AORTIC VALVE REPLACEMENT, TRANSFEMORAL  08/07/2019   TRANSCATHETER AORTIC VALVE REPLACEMENT,  TRANSFEMORAL N/A 08/07/2019   Procedure: TRANSCATHETER AORTIC VALVE REPLACEMENT, TRANSFEMORAL;  Surgeon: Tonny Bollman, MD;  Location: [facility] INVASIVE CV LAB;  Service: Open Heart Surgery;  Laterality: N/A;    Current Outpatient Medications:    acetaminophen (TYLENOL) 325 MG tablet, Take 2 tablets (650 mg total) by mouth every 6 (six) hours as needed for mild pain (or Fever >/= 101)., Disp: , Rfl:    amLODipine (NORVASC) 5 MG tablet, TAKE 1 TABLET BY MOUTH DAILY., Disp: 90 tablet, Rfl: 1   azithromycin (ZITHROMAX) 500 MG tablet, Take 1 tablet (500 mg) one hour prior to all dental visits., Disp: 3 tablet, Rfl: 12   Boswellia-Glucosamine-Vit D (OSTEO BI-FLEX ONE PER DAY PO), Take 1 tablet by mouth daily., Disp: , Rfl:    Cholecalciferol (VITAMIN D-3) 125 MCG (5000 UT) TABS, Take 1 tablet by mouth daily., Disp: 90 tablet, Rfl: 3   clopidogrel (PLAVIX) 75 MG tablet, TAKE 1 TABLET(75 MG) BY MOUTH DAILY, Disp: 90 tablet, Rfl: 0   cyanocobalamin (VITAMIN B12) 500 MCG tablet, Take 1 tablet (500 mcg total) by mouth daily., Disp: 90 tablet, Rfl: 0   Glucosamine-Chondroitin 500-400 MG CAPS, TAKE (1) CAPSULE BY MOUTH DAILY., Disp: 32 capsule, Rfl: 10   Lactobacillus (PROBIOTIC ACIDOPHILUS PO), Take 1 tablet by mouth daily., Disp: , Rfl:    magnesium oxide (MAG-OX) 400 (240 Mg) MG tablet, TAKE 1 TABLET(400 MG) BY MOUTH DAILY, Disp: 90 tablet, Rfl: 1   Probiotic Product (RISA-BID PROBIOTIC) TABS, TAKE (1) TABLET BY MOUTH DAILY., Disp: 31 tablet, Rfl: 10   sertraline (ZOLOFT) 100 MG tablet, TAKE (1_1/2) TABLETS BY MOUTH DAILY., Disp: 45 tablet, Rfl: 10  Clinical Interview:   The following information was obtained during  a clinical interview with Michaela Simpson and her daughter prior to cognitive testing.  Cognitive Symptoms: Decreased short-term memory: Denied. While Michaela Simpson acknowledged some memory lapses here and there and trouble recalling names of new individuals within her assisted living facility  (which she recently moved into), she largely denied prominent memory concerns, attributing any difficulties to age-related changes. Her daughter did describe some greater memory concerns, largely surrounding forgetfulness and generalized short-term memory dysfunction.  Decreased long-term memory: Denied. Decreased attention/concentration: Denied. Reduced processing speed: Denied. Difficulties with executive functions: Denied. Her daughter reported fairly prominent concerns surrounding diminished reasoning abilities and decision making. She described Ms. Flaum experiencing several falls over the past few years, often with significant orthopedic injuries (e.g., hip fractures, wrist fracture, neck fracture). Historically, Ms. Loretto was quite resistant to using a walker or other assistive device and only recently relented during the past several weeks. Her daughter noted a prior instance where Ms. Roedel removed a cast after sustaining a wrist fracture due to her not feeling that she needed it or her injury that significant. Following her most recent fall this past October and subsequent neck fractures, Ms. Cuthrell noted that she was able to wait two days after falling before relenting and asking for assistance. Her daughter noted that it was actually four days and while Ms. Toops told this story with a sense of personal pride, her daughter told it from the perspective of Ms. Capek having a serious, potentially life threatening injury and her not seeking immediate help.  Difficulties with emotion regulation: Denied. Difficulties with receptive language: Denied. Difficulties with word finding: Endorsed occasionally. Decreased visuoperceptual ability: Denied.  Difficulties completing ADLs: Ms. Stotler was living independently at the time of her October 2024 fall and subsequent neck fracture. Following her discharge from the hospital, she was placed in a skilled nursing facility. She  remained there until insurance benefits ran out and was then placed into an assisted living facility. While this may have been viewed as a temporary solution, there have been increasing concerns, both cognitively and functionally, that it may not be in her best interest for Ms. Kuntzman to return to independent living. Since her fall, the staff at her various facilities have been managing medications. Her daughter noted that she was able to do this with mild assistance from family previously. Family had taken over financial management and bill paying concerns due to ongoing difficulties. Ms. Hattery also no longer drives.   Additional Medical History: History of traumatic brain injury/concussion: Unclear. Historically, she has had several falls with direct head impacts. During her fall this past October where she slipped and fell backwards in her garage, she was noted to hit the back of her head quite hard on the ground and was noted to have a large bruise. She was unsure if she lost consciousness following this fall and her associated head CT was negative. However, there remains the potential that she experienced a concussion stemming from this more recent event.  History of stroke: Denied. History of seizure activity: Denied. History of known exposure to toxins: Denied. Symptoms of chronic pain: She has a history of rheumatoid arthritis. As stated above, falls have resulted in hip, neck, and wrist fractures in the past.  Experience of frequent headaches/migraines: Denied. Medical records suggest a history of headache experiences.  Frequent instances of dizziness/vertigo: Denied.  Sensory changes: Denied.  Balance/coordination difficulties: As stated above, she has a history of recurring falls. Her daughter was ultimately unsure if these falls were  happening due to Ms. Hession slipping or tripping over things in her environment or if there was another source (e.g., blood pressure related). Her  daughter noted that prior to Ms. Weisberg using her walker, she had a tendency to veer to the right while ambulating. Ms. Rosario denied one side of the body feeling weaker or less stable. She has been using a rolling walker for the past several weeks with benefit.  Other motor difficulties: Denied.  Sleep History: Estimated hours obtained each night: Unclear. She reported having good and bad days with regard to sleep, making a numerical estimation difficult to arrive at.  Difficulties falling asleep: Endorsed occasionally. Difficulties staying asleep: Endorsed occasionally. Feels rested and refreshed upon awakening: Variably so depending on the quantity and quality of sleep obtained the night before.   History of snoring: Denied. History of waking up gasping for air: Denied. Witnessed breath cessation while asleep: Denied.  History of vivid dreaming: Endorsed. Excessive movement while asleep: Denied. Instances of acting out her dreams: Denied.  Psychiatric/Behavioral Health History: Depression: There is a longstanding history of major depressive disorder, generally aided well with medication. Currently, she stated "I put on a good act" and "I'm a real good actor" when asked about her mood. Her daughter highlighted that Ms. Dudas has made comments surrounding her being sad and unhappy due to the potential that she may not return to independent living in the future. Current or remote suicidal ideation, intent, or plan was denied.  Anxiety: Denied. Mania: Denied. Trauma History: Denied. Visual/auditory hallucinations: Denied. Delusional thoughts: Denied.  Tobacco: Denied. Alcohol: She denied current alcohol consumption as well as a history of problematic alcohol abuse or dependence.  Recreational drugs: Denied.  Family History: Problem Relation Age of Onset   Cancer Sister    Depression Other        multiple family members   This information was confirmed by Ms.  Jarvis Simpson.  Academic/Vocational History: Highest level of educational attainment: 12 years. She graduated from high school and described herself as an average (A/B/C) student in academic settings. Math was noted as a potential relative weakness.  History of developmental delay: Denied. History of grade repetition: Denied. Enrollment in special education courses: Denied. History of LD/ADHD: Denied.  Employment: Retired.   Evaluation Results:   Behavioral Observations: Ms. Sunde was accompanied by her daughter, arrived to her appointment on time, and was appropriately dressed and groomed. She appeared alert. She ambulated with the assistance of a rolling walker and maneuvered this device well. Gross motor functioning appeared intact upon informal observation and no abnormal movements (e.g., tremors) were noted. Her affect was generally relaxed and positive, but did range appropriately given the subject being discussed during interview. Spontaneous speech was fluent. Mild word finding difficulties were observed during interview. Thought processes were coherent, organized, and normal in content. Insight into her cognitive difficulties appeared poor. However, she may simply be actively diminishing ongoing concerns rather than not have an appropriate degree of insight into true limitations and ongoing impairment.  During testing, sustained attention was appropriate. Task engagement was adequate and she persisted when challenged. She did fatigue as the evaluation progressed. It was abbreviated in response. Overall, Ms. Rampersad was cooperative with the clinical interview and subsequent testing procedures.   Adequacy of Effort: The validity of neuropsychological testing is limited by the extent to which the individual being tested may be assumed to have exerted adequate effort during testing. Ms. Parrillo expressed her intention to perform to the best of  her abilities and exhibited adequate task  engagement and persistence. Scores across stand-alone and embedded performance validity measures were within expectation. As such, the results of the current evaluation are believed to be a valid representation of Ms. Burruel current cognitive functioning.  Test Results: Ms. Grall was largely oriented at the time of the current evaluation. She was unable to name the current clinic.  Intellectual abilities based upon educational and vocational attainment were estimated to be in the average range. Premorbid abilities were estimated to be within the below average range based upon a single-word reading test.   Processing speed was variable, ranging from the exceptionally low to below average normative ranges. Basic attention was average. More complex attention (e.g., working memory) was unable to be assessed due to increasing fatigue. Executive functioning was variable. Cognitive flexibility was exceptionally low, safety/judgment was below average (points largely lost due to the provision of overly vague responses), and abstract reasoning was average.   While not directly assessed, receptive language abilities were believed to be intact. Ms. Ploch did not exhibit any difficulties comprehending task instructions and answered all questions asked of her appropriately. Assessed expressive language was variable. Phonemic fluency was average, semantic fluency was exceptionally low to well below average, and confrontation naming was average across a screening task but well below average across a more comprehensive task.   Assessed visuospatial/visuoconstructional abilities were variable, ranging from the exceptionally low to below average normative ranges. Across her drawing of a clock, she exhibited confusion when asked to draw the clock hands and was ultimately unable to make an attempt. She simply drew a circle around the number 11 and numbers 1 and 2. Across her copy of a complex figure, she was  stimulus bound. Points were lost due to subsequent spatial distortions and one external aspect being fully omitted.     Learning (i.e., encoding) of novel verbal information was variable, ranging from the exceptionally low to below average normative ranges. Spontaneous delayed recall (i.e., retrieval) of previously learned information was commensurate with performances across learning trials. Retention rates were 17% across a list learning task, 75% (raw score of 3) across a story learning task, and 0% across a figure drawing task. Performance across recognition tasks was below average to average, suggesting some evidence for information consolidation.   Results of emotional screening instruments suggested that recent symptoms of generalized anxiety were in the moderate range, while symptoms of depression were within the mild range. A screening instrument assessing recent sleep quality suggested the presence of mild sleep dysfunction.  Tables of Scores:   Note: This summary of test scores accompanies the interpretive report and should not be considered in isolation without reference to the appropriate sections in the text. Descriptors are based on appropriate normative data and may be adjusted based on clinical judgment. Terms such as "Within Normal Limits" and "Outside Normal Limits" are used when a more specific description of the test score cannot be determined.       Percentile - Normative Descriptor > 98 - Exceptionally High 91-97 - Well Above Average 75-90 - Above Average 25-74 - Average 9-24 - Below Average 2-8 - Well Below Average < 2 - Exceptionally Low       Validity:   DESCRIPTOR       DCT: --- --- Within Normal Limits  RBANS EI: --- --- Within Normal Limits       Orientation:      Raw Score Percentile   NAB Orientation, Form 1 27/29 --- ---  Cognitive Screening:      Raw Score Percentile   SLUMS: 13/30 --- ---       RBANS, Form A: Standard Score/ Scaled Score  Percentile   Total Score 62 1 Exceptionally Low  Immediate Memory 73 4 Well Below Average    List Learning 7 16 Below Average    Story Memory 3 1 Exceptionally Low  Visuospatial/Constructional 62 1 Exceptionally Low    Figure Copy 6 9 Below Average    Line Orientation 0/20 <2 Exceptionally Low  Language 80 9 Below Average    Picture Naming 9/10 26-50 Average    Semantic Fluency 2 <1 Exceptionally Low  Attention 75 5 Well Below Average    Digit Span 9 37 Average    Coding 3 1 Exceptionally Low  Delayed Memory 60 <1 Exceptionally Low    List Recall 1/10 10-16 Below Average    List Recognition 17/20 10-16 Below Average    Story Recall 5 5 Well Below Average    Story Recognition 8/12 14-28 Below Average to  Average    Figure Recall 1 <1 Exceptionally Low    Figure Recognition 5/8 39-57 Average        Intellectual Functioning:      Standard Score Percentile   Test of Premorbid Functioning: 84 14 Below Average       Attention/Executive Function:     Trail Making Test (TMT): Raw Score (T Score) Percentile     Part A 60 secs.,  0 errors (40) 16 Below Average    Part B Discontinued --- Impaired         Scaled Score Percentile   WAIS-IV Similarities: 8 25 Average       NAB Executive Functions Module, Form 1: T Score Percentile     Judgment 38 12 Below Average       Language:     Verbal Fluency Test: Raw Score (Scaled Score) Percentile     Phonemic Fluency (CFL) 23 (8) 25 Average    Category Fluency 15 (4) 2 Well Below Average  *Based on [employer] Older Normative Studies (MOANS)          NAB Language Module, Form 1: T Score Percentile     Naming 24/31 (36) 8 Well Below Average       Visuospatial/Visuoconstruction:      Raw Score Percentile   Clock Drawing: 6/10 --- Impaired       Mood and Personality:      Raw Score Percentile   Geriatric Depression Scale: 14 --- Mild  Geriatric Anxiety Scale: 26 --- Moderate    Somatic 10 --- Moderate    Cognitive 8 --- Moderate     Affective 8 --- Moderate       Additional Questionnaires:      Raw Score Percentile   PROMIS Sleep Disturbance Questionnaire: 28 --- Mild   Informed Consent and Coding/Compliance:   The current evaluation represents a clinical evaluation for the purposes previously outlined by the referral source and is in no way reflective of a forensic evaluation.   Ms. Betton was provided with a verbal description of the nature and purpose of the present neuropsychological evaluation. Also reviewed were the foreseeable risks and/or discomforts and benefits of the procedure, limits of confidentiality, and mandatory reporting requirements of this provider. The patient was given the opportunity to ask questions and receive answers about the evaluation. Oral consent to participate was provided by the patient.   This evaluation was conducted by Earna Coder C.  Milbert Coulter, Ph.D., ABPP-CN, board certified clinical neuropsychologist. Ms. Manz completed a clinical interview with Dr. Milbert Coulter, billed as one unit [phone], and 110 minutes of cognitive testing and scoring, billed as one unit [phone] and three additional units [id number]. Psychometrist Shan Levans, B.S. assisted Dr. Milbert Coulter with test administration and scoring procedures. As a separate and discrete service, one unit [id number] and two units [phone] were billed for Dr. Tammy Sours time spent in interpretation and report writing.

## 2023-07-19 ENCOUNTER — Ambulatory Visit (HOSPITAL_BASED_OUTPATIENT_CLINIC_OR_DEPARTMENT_OTHER): Payer: Medicare Other | Admitting: Family Medicine

## 2023-07-21 ENCOUNTER — Encounter: Payer: Self-pay | Admitting: Physician Assistant

## 2023-07-21 ENCOUNTER — Other Ambulatory Visit: Payer: Self-pay | Admitting: Internal Medicine

## 2023-07-21 NOTE — Assessment & Plan Note (Signed)
Again reviewed concerns today with patient's daughters who presented to office today.  Discussed concerns related to cognitive changes for patient and concerns related to ability to live independently including caring for self as well as transportation concerns.  Concerns as outlined in note from visit on January 8 were again reviewed.  We discussed possible resources.  Previously referral was placed for further cognitive evaluation and this is in the process of being arranged.  Given concerns, we will also reach out to care management to assist with navigating care concerns.

## 2023-07-22 ENCOUNTER — Encounter: Payer: Self-pay | Admitting: Physician Assistant

## 2023-07-27 ENCOUNTER — Ambulatory Visit
Admission: RE | Admit: 2023-07-27 | Discharge: 2023-07-27 | Disposition: A | Discharge status: Home / Self Care | Payer: Medicare Other | Source: Ambulatory Visit | Attending: Physician Assistant | Admitting: Physician Assistant

## 2023-07-27 DIAGNOSIS — G319 Degenerative disease of nervous system, unspecified: Secondary | ICD-10-CM | POA: Diagnosis not present

## 2023-07-27 DIAGNOSIS — R413 Other amnesia: Secondary | ICD-10-CM | POA: Diagnosis not present

## 2023-07-27 DIAGNOSIS — I6782 Cerebral ischemia: Secondary | ICD-10-CM | POA: Diagnosis not present

## 2023-07-29 NOTE — Progress Notes (Signed)
 MRI of the brain without acute changes.  No new findings.  There are some mild changes in the circulation, and some changes in the shape of the brain as before.  No bleeding or stroke.  There is an old meningioma (growth in the meninges not inside the brain) as before, but has not grown..  Thank you

## 2023-08-02 ENCOUNTER — Ambulatory Visit (INDEPENDENT_AMBULATORY_CARE_PROVIDER_SITE_OTHER): Payer: Medicare Other | Admitting: Psychology

## 2023-08-02 ENCOUNTER — Encounter (HOSPITAL_BASED_OUTPATIENT_CLINIC_OR_DEPARTMENT_OTHER): Payer: Medicare Other

## 2023-08-02 DIAGNOSIS — F03A3 Unspecified dementia, mild, with mood disturbance: Secondary | ICD-10-CM

## 2023-08-02 NOTE — Progress Notes (Signed)
   Neuropsychology Feedback Session [name]. [name] Department of Neurology  Reason for Referral:   Michaela Simpson is a 89 y.o. right-handed Caucasian female referred by Marlowe Kays, PA-C, to characterize her current cognitive functioning and assist with diagnostic clarity and treatment planning in the context of subjective cognitive decline.   Feedback:   Michaela Simpson completed a comprehensive neuropsychological evaluation on 07/18/2023. Please refer to that encounter for the full report and recommendations. Briefly, results suggested isolated impairments surrounding cognitive flexibility, semantic fluency, and clock drawing. Further weakness was exhibited across encoding (i.e., learning) and delayed retrieval aspects of memory. Variability was exhibited across processing speed, confrontation naming, and non-clock drawing visuospatial abilities. The etiology for her mild dementia presentation remains unclear. With that being said, I do have some concern for an underlying neurodegenerative illness, namely Alzheimer's disease. Across memory testing, Michaela Simpson had notable difficulty recalling information she had previously learned after a fairly brief delay. This raises concerns for rapid forgetting, which is one of the hallmark testing patterns for this illness. Further impairment/weakness surrounding semantic fluency, confrontation naming, clock drawing, and cognitive flexibility would follow typical disease progression. With that being said, she did show benefit from cueing across all memory tasks. This is certainly encouraging and would not suggest a prominent storage impairment at the current time. Overall, should Alzheimer's disease truly be present, it would appear to be in early stages. Continued monitoring will be very important moving forward.   Michaela Simpson was accompanied by her daughter during the current feedback session. Content of the current session  focused on the results of her neuropsychological evaluation. Michaela Simpson was given the opportunity to ask questions and her questions were answered. She was encouraged to reach out should additional questions arise. A copy of her report was provided at the conclusion of the visit.      One unit [phone] was billed for Dr. Tammy Sours time spent preparing for, conducting, and documenting the current feedback session with Michaela Simpson.

## 2023-08-16 ENCOUNTER — Encounter (HOSPITAL_BASED_OUTPATIENT_CLINIC_OR_DEPARTMENT_OTHER): Payer: Medicare Other

## 2023-08-18 ENCOUNTER — Other Ambulatory Visit (INDEPENDENT_AMBULATORY_CARE_PROVIDER_SITE_OTHER): Payer: Self-pay

## 2023-08-18 ENCOUNTER — Ambulatory Visit (INDEPENDENT_AMBULATORY_CARE_PROVIDER_SITE_OTHER): Payer: Medicare Other | Admitting: Orthopedic Surgery

## 2023-08-18 DIAGNOSIS — S12190A Other displaced fracture of second cervical vertebra, initial encounter for closed fracture: Secondary | ICD-10-CM

## 2023-08-18 NOTE — Progress Notes (Signed)
 Orthopedic Spine Surgery Office Note   Assessment: Patient is a 89 y.o. female with C2 lateral mass fracture with intra-articular extension that is minimally displaced.  Neck pain has resolved.  No interval displacement seen on today's films     Plan: -No spine specific precautions -Activity as tolerated -Can use Tylenol as needed for pain relief up to 1000 mg 3 times daily -Patient should return to office on an as-needed basis     Patient expressed understanding of the plan and all questions were answered to the patient's satisfaction.    ___________________________________________________________________________     History:   Patient is a 89 y.o. female who presents today for follow up on her cervical spine.  Patient is been doing well since she was last seen.  She is happy to be out of the cervical collar.  She occasionally has some neck soreness if she is looking down for a long period of time like when she is sewing.  She does not have any pain radiating to either upper extremity.  She has been getting around with a walker.  She has recently switched her housing and is now living in senior living.    Physical Exam:   General: no acute distress, appears stated age Neurologic: alert, answering questions appropriately, following commands Respiratory: unlabored breathing on room air, symmetric chest rise Psychiatric: appropriate affect, normal cadence to speech     MSK (spine):   -Strength exam                                                   Left                  Right Grip strength                5/5                  5/5 Interosseus                  5/5                  5/5 Wrist extension            5/5                  5/5 Wrist flexion                 5/5                  5/5 Elbow flexion                5/5                  5/5 Deltoid                          5/5                  5/5   -Sensory exam                             Sensation intact to light touch in  C5-T1 nerve distributions of bilateral upper extremities     Imaging: XRs of the cervical spine from 08/18/2023 were independently  reviewed and interpreted, showing degenerative changes throughout the cervical spine.  There is disc height loss and anterior osteophyte formation at every level.  Positive sagittal balance.  On the open-mouth view, the C1/2 joints are well aligned.  There is no lateral mass displacement or caudal migration of the C1 lateral mass of the C2.  No new fracture seen.      Patient name: Michaela Simpson Patient MRN: [id number] Date of visit: 08/18/23

## 2023-08-19 ENCOUNTER — Other Ambulatory Visit (HOSPITAL_BASED_OUTPATIENT_CLINIC_OR_DEPARTMENT_OTHER): Payer: Self-pay | Admitting: Family Medicine

## 2023-09-05 ENCOUNTER — Encounter: Payer: Self-pay | Admitting: Physician Assistant

## 2023-09-05 ENCOUNTER — Ambulatory Visit (INDEPENDENT_AMBULATORY_CARE_PROVIDER_SITE_OTHER): Payer: PRIVATE HEALTH INSURANCE | Admitting: Physician Assistant

## 2023-09-05 VITALS — BP 155/88 | HR 71 | Ht 61.0 in | Wt 112.6 lb

## 2023-09-05 DIAGNOSIS — F03A3 Unspecified dementia, mild, with mood disturbance: Secondary | ICD-10-CM | POA: Diagnosis not present

## 2023-09-05 NOTE — Progress Notes (Addendum)
 Assessment/Plan:   Dementia mild, unclear etiology with mood disturbance   Michaela Simpson is a very pleasant 89 y.o. RH female with a history of hypertension, hyperlipidemia, PAD, insomnia, recent closed lateral, minimally displaced, C2  fracture (03/2023), severe Ao Stenosis st TAVR , borderline QT prolongation, history of thoracic aortic aneurysm, anxiety, depression,  B12 deficiency seen today in follow up for memory loss. Patient I snot on antidementia medication.  We discussed the side effects of memantine, risks and benefits her daughter preferred to wait for now.  Mood is good.  She tries to remain active, she lives in ALF and she enjoys this very much.    Follow up in 6  months. Repeat neuropsych evaluation in 12 to 18 months for diagnostic clarity and disease trajectory Recommend good control of her cardiovascular risk factors.  Patient informed of abnormal pressure Repeat CT of the head for meningioma in 6 months (daughter preferred to CT and MRI brain) Continue to control mood as per PCP    Subjective:    This patient is accompanied in the office by her daughter who supplements the history.  Previous records as well as any outside records available were reviewed prior to todays visit. Patient was last seen on    Any changes in memory since last visit? Denies. Patient has some difficulty remembering recent conversations and names of people. LTM is fair.  Being in Assisted living, I feel more relaxed-she  says .  repeats oneself?  Endorsed Disoriented when walking into a room?  Patient denies    Leaving objects?  May misplace things but not in unusual places   Wandering behavior?  denies   Any personality changes since last visit?  She is going to sell her house soon which may bring some anxiety to her. History reviewed with  Any worsening depression?:  Denies.   Hallucinations or paranoia?  Denies.   Seizures? denies    Any sleep changes?  Does not sleep very  well, denies vivid dreams, REM behavior or sleepwalking   Sleep apnea?   Denies.   Any hygiene concerns? Denies.  Independent of bathing and dressing? Daughter is in charge, ALF staff assists   Who is in charge of the finances?  Daughter is in charge     Any changes in appetite?  Denies. It is good.     Patient have trouble swallowing? Denies.   Does the patient cook? No. Any headaches?   Denies.   Chronic back pain  denies   Ambulates with difficulty?  She uses a walker to prevent falls.  She is off her cervical collar after her the 2 closed fracture followed by NS. Recent falls or head injuries? denies since her last mechanical fall  03/2023 with subsequent C2 closed fracture requiring hard collar which is now removed since January 2024.      Unilateral weakness, numbness or tingling? denies   Any tremors?  Denies   Any anosmia?  Denies   Any incontinence of urine?  Endorsed, urge incontinence, with recurrent UTIs, wears diapers Any bowel dysfunction?   Denies      Patient lives in an assisted living has home health nurse. She has 24/7 care now.  Does the patient drive? No longer drives    Initial visit December 2024 How long did patient have memory difficulties? Unclear when symptoms became present.  Patient has difficulty remembering new information, recent conversations and names of people. When under stress, she gets confused when agitated  and tired-daughter says. repeats oneself?  Endorsed Disoriented when walking into a room?  Denies except occasionally not remembering what patient came to the room for    Leaving objects in unusual places?   Denies.  Wandering behavior? Denies.   Any personality changes, or depression, anxiety?  Has moments of irritability.  Hallucinations or paranoia?  Denies.   Seizures? Denies.    Any sleep changes? Does not sleep well , had some nightmares after the falls, it may have been related to UTIs . Denies  REM behavior or sleepwalking.   Sleep  apnea? Denies.   Any hygiene concerns?  Denies.   Independent of bathing and dressing?Does not  need assistance getting dressed.  Who is in charge of the medications?  Facility is in charge   Who is in charge of the finances? Daughter  is in charge  for now because she is in ALF    Any changes in appetite?   Denies.     Patient have trouble swallowing?  Denies.   Does the patient cook?  No   Any headaches?  Denies.   Chronic back pain?  Denies.   Ambulates with difficulty? Needs a cane due to history of falls  Recent falls or head injuries? Endorsed, after mechanical fall 04/06/23 with subsequent C2 closed fracture, requiring hard collar.  Vision changes?She has a history of cataracts.  Stroke like symptoms?  Denies.   Any tremors?  Denies.   Any anosmia?  Denies.   Any incontinence of urine? Urge incontinence.Recent UTI.  Any bowel dysfunction? Denies.      Patient lives temporarily at Assisted [facility]. Until then she was living alone. Daughter is looking for a different place upon discharge from ALF   History of heavy alcohol intake? Denies.   History of heavy tobacco use? Denies.   Family history of dementia?  Yes, 3 sisters with dementia ?type Does patient drive?  She was driving short distances until the fall.    Neuropsych evaluation 07/18/2023 briefly, results suggested isolated impairments surrounding cognitive flexibility, semantic fluency, and clock drawing. Further weakness was exhibited across encoding (i.e., learning) and delayed retrieval aspects of memory. Variability was exhibited across processing speed, confrontation naming, and non-clock drawing visuospatial abilities. The etiology for her mild dementia presentation remains unclear. With that being said, I do have some concern for an underlying neurodegenerative illness, namely Alzheimer's disease. Across memory testing, Michaela Simpson had notable difficulty recalling information she had  previously learned after a fairly brief delay. This raises concerns for rapid forgetting, which is one of the hallmark testing patterns for this illness. Further impairment/weakness surrounding semantic fluency, confrontation naming, clock drawing, and cognitive flexibility would follow typical disease progression. With that being said, she did show benefit from cueing across all memory tasks. This is certainly encouraging and would not suggest a prominent storage impairment at the current time. Overall, should Alzheimer's disease truly be present, it would appear to be in early stages. Continued monitoring will be very important moving forward.   PREVIOUS MEDICATIONS:   CURRENT MEDICATIONS:  Outpatient Encounter Medications as of 09/05/2023  Medication Sig   acetaminophen  (TYLENOL ) 325 MG tablet Take 2 tablets (650 mg total) by mouth every 6 (six) hours as needed for mild pain (or Fever >/= 101).   amLODipine  (NORVASC ) 5 MG tablet TAKE 1 TABLET BY MOUTH DAILY.   azithromycin  (ZITHROMAX ) 500 MG tablet Take 1 tablet (500 mg) one hour prior to all dental  visits.   Boswellia-Glucosamine-Vit D (OSTEO BI-FLEX ONE PER DAY PO) Take 1 tablet by mouth daily.   Cholecalciferol  (VITAMIN D -3) 125 MCG (5000 UT) TABS Take 1 tablet by mouth daily.   clopidogrel  (PLAVIX ) 75 MG tablet TAKE (1) TABLET BY MOUTH DAILY.   cyanocobalamin  (VITAMIN B12) 500 MCG tablet Take 1 tablet by mouth once daily   Glucosamine-Chondroitin 500-400 MG CAPS TAKE (1) CAPSULE BY MOUTH DAILY.   Lactobacillus (PROBIOTIC ACIDOPHILUS PO) Take 1 tablet by mouth daily.   magnesium  oxide (MAG-OX) 400 (240 Mg) MG tablet TAKE 1 TABLET(400 MG) BY MOUTH DAILY   Probiotic Product (RISA-BID PROBIOTIC) TABS TAKE (1) TABLET BY MOUTH DAILY.   sertraline  (ZOLOFT ) 100 MG tablet TAKE (1_1/2) TABLETS BY MOUTH DAILY.   No facility-administered encounter medications on file as of 09/05/2023.        No data to display            05/30/2023    9:00 AM   Montreal Cognitive Assessment   Visuospatial/ Executive (0/5) 1  Naming (0/3) 3  Attention: Read list of digits (0/2) 2  Attention: Read list of letters (0/1) 1  Attention: Serial 7 subtraction starting at 100 (0/3) 0  Language: Repeat phrase (0/2) 1  Language : Fluency (0/1) 1  Abstraction (0/2) 1  Delayed Recall (0/5) 1  Orientation (0/6) 5  Total 16  Adjusted Score (based on education) 17    Objective:     PHYSICAL EXAMINATION:    VITALS:   Vitals:   09/05/23 1108  BP: (!) 155/88  Pulse: 71  SpO2: 98%  Weight: 112 lb 9.6 oz (51.1 kg)  Height: 5' 1 (1.549 m)    GEN:  The patient appears stated age and is in NAD. HEENT:  Normocephalic, atraumatic.   Neurological examination:  General: NAD, well-groomed, appears stated age. Orientation: The patient is alert. Oriented to person, place and date Cranial nerves: There is good facial symmetry.  Mildly anxious.The speech is fluent and clear. No aphasia or dysarthria. Fund of knowledge is appropriate. Recent and remote memory are impaired. Attention and concentration are reduced.  Able to name objects and repeat phrases.  Hearing is intact to conversational tone.   Sensation: Sensation is intact to light touch throughout Motor: Strength is at least antigravity x4. DTR's 2/4 in UE/LE     Movement examination: Tone: There is normal tone in the UE/LE Abnormal movements:  no tremor.  No myoclonus.  No asterixis.   Coordination:  There is no decremation with RAM's. Normal finger to nose  Gait and Station: The patient has some difficulty arising out of a deep-seated chair without the use of the hands.  Needs a walker to ambulate the patient's stride length is slow.  Gait is cautious and narrow.    Thank you for allowing us  the opportunity to participate in the care of this nice patient. Please do not hesitate to contact us  for any questions or concerns.   Total time spent on today's visit was 42 minutes dedicated to this  patient today, preparing to see patient, examining the patient, ordering tests and/or medications and counseling the patient, documenting clinical information in the EHR or other health record, independently interpreting results and communicating results to the patient/family, discussing treatment and goals, answering patient's questions and coordinating care.  Cc:  de [place], Raymond J, MD  Camie Sevin 09/05/2023 12:36 PM

## 2023-09-05 NOTE — Patient Instructions (Addendum)
 It was a pleasure to see you today at our office.   Recommendations:  Neurocognitive evaluation at our office in 1 year  Follow up Sept 18 at 11:30  Repeat Ct head  after  the next visit for follow up for possible meningioma    For psychiatric meds, mood meds: Please have your primary care physician manage these medications.  If you have any severe symptoms of a stroke, or other severe issues such as confusion,severe chills or fever, etc call 911 or go to the ER as you may need to be evaluated further     For assessment of decision of mental capacity and competency:  Call Dr. Erick Blinks, geriatric psychiatrist at [phone]  Counseling regarding caregiver distress, including caregiver depression, anxiety and issues regarding community resources, adult day care programs, adult living facilities, or memory care questions:  please contact your  Primary Doctor's Social Worker   Whom to call: Memory  decline, memory medications: Call our office [phone]    [email or web address]   RECOMMENDATIONS FOR ALL PATIENTS WITH MEMORY PROBLEMS: 1. Continue to exercise (Recommend 30 minutes of walking everyday, or 3 hours every week) 2. Increase social interactions - continue going to [place] and enjoy social gatherings with friends and family 3. Eat healthy, avoid fried foods and eat more fruits and vegetables 4. Maintain adequate blood pressure, blood sugar, and blood cholesterol level. Reducing the risk of stroke and cardiovascular disease also helps promoting better memory. 5. Avoid stressful situations. Live a simple life and avoid aggravations. Organize your time and prepare for the next day in anticipation. 6. Sleep well, avoid any interruptions of sleep and avoid any distractions in the bedroom that may interfere with adequate sleep quality 7. Avoid sugar, avoid sweets as there is a strong link between excessive sugar intake,  diabetes, and cognitive impairment We discussed the Mediterranean diet, which has been shown to help patients reduce the risk of progressive memory disorders and reduces cardiovascular risk. This includes eating fish, eat fruits and green leafy vegetables, nuts like almonds and hazelnuts, walnuts, and also use olive oil. Avoid fast foods and fried foods as much as possible. Avoid sweets and sugar as sugar use has been linked to worsening of memory function.  There is always a concern of gradual progression of memory problems. If this is the case, then we may need to adjust level of care according to patient needs. Support, both to the patient and caregiver, should then be put into place.          DRIVING: Regarding driving, in patients with progressive memory problems, driving will be impaired. We advise to have someone else do the driving if trouble finding directions or if minor accidents are reported. Independent driving assessment is available to determine safety of driving.   If you are interested in the driving assessment, you can contact the following:  [facility] [employer] [facility] in [place] [phone]  [facility] [phone]  [facility] [phone]  [facility] [phone] or [phone]   FALL PRECAUTIONS: Be cautious when walking. Scan the area for obstacles that may increase the risk of trips and falls. When getting up in the mornings, sit up at the edge of the bed for a few minutes before getting out of bed. Consider elevating the bed at the head end to avoid drop of blood pressure when getting up. Walk always in a well-lit room (use night lights in the walls). Avoid area rugs or power cords from appliances in  the middle of the walkways. Use a walker or a cane if necessary and consider physical therapy for balance exercise. Get your eyesight checked regularly.  FINANCIAL OVERSIGHT: Supervision, especially oversight when making financial  decisions or transactions is also recommended.  HOME SAFETY: Consider the safety of the kitchen when operating appliances like stoves, microwave oven, and blender. Consider having supervision and share cooking responsibilities until no longer able to participate in those. Accidents with firearms and other hazards in the house should be identified and addressed as well.   ABILITY TO BE LEFT ALONE: If patient is unable to contact 911 operator, consider using LifeLine, or when the need is there, arrange for someone to stay with patients. Smoking is a fire hazard, consider supervision or cessation. Risk of wandering should be assessed by caregiver and if detected at any point, supervision and safe proof recommendations should be instituted.  MEDICATION SUPERVISION: Inability to self-administer medication needs to be constantly addressed. Implement a mechanism to ensure safe administration of the medications.      Mediterranean Diet A Mediterranean diet refers to food and lifestyle choices that are based on the traditions of countries located on the [employer]. This way of eating has been shown to help prevent certain conditions and improve outcomes for people who have chronic diseases, like kidney disease and heart disease. What are tips for following this plan? Lifestyle  Cook and eat meals together with your family, when possible. Drink enough fluid to keep your urine clear or pale yellow. Be physically active every day. This includes: Aerobic exercise like running or swimming. Leisure activities like gardening, walking, or housework. Get 7-8 hours of sleep each night. If recommended by your health care provider, drink red wine in moderation. This means 1 glass a day for nonpregnant women and 2 glasses a day for men. A glass of wine equals 5 oz (150 mL). Reading food labels  Check the serving size of packaged foods. For foods such as rice and pasta, the serving size refers to the amount  of cooked product, not dry. Check the total fat in packaged foods. Avoid foods that have saturated fat or trans fats. Check the ingredients list for added sugars, such as corn syrup. Shopping  At the grocery store, buy most of your food from the areas near the walls of the store. This includes: Fresh fruits and vegetables (produce). Grains, beans, nuts, and seeds. Some of these may be available in unpackaged forms or large amounts (in bulk). Fresh seafood. Poultry and eggs. Low-fat dairy products. Buy whole ingredients instead of prepackaged foods. Buy fresh fruits and vegetables in-season from local farmers markets. Buy frozen fruits and vegetables in resealable bags. If you do not have access to quality fresh seafood, buy precooked frozen shrimp or canned fish, such as tuna, salmon, or sardines. Buy small amounts of raw or cooked vegetables, salads, or olives from the deli or salad bar at your store. Stock your pantry so you always have certain foods on hand, such as olive oil, canned tuna, canned tomatoes, rice, pasta, and beans. Cooking  Cook foods with extra-virgin olive oil instead of using butter or other vegetable oils. Have meat as a side dish, and have vegetables or grains as your main dish. This means having meat in small portions or adding small amounts of meat to foods like pasta or stew. Use beans or vegetables instead of meat in common dishes like chili or lasagna. Experiment with different cooking methods. Try roasting or broiling  vegetables instead of steaming or sauteing them. Add frozen vegetables to soups, stews, pasta, or rice. Add nuts or seeds for added healthy fat at each meal. You can add these to yogurt, salads, or vegetable dishes. Marinate fish or vegetables using olive oil, lemon juice, garlic, and fresh herbs. Meal planning  Plan to eat 1 vegetarian meal one day each week. Try to work up to 2 vegetarian meals, if possible. Eat seafood 2 or more times a  week. Have healthy snacks readily available, such as: Vegetable sticks with hummus. Greek yogurt. Fruit and nut trail mix. Eat balanced meals throughout the week. This includes: Fruit: 2-3 servings a day Vegetables: 4-5 servings a day Low-fat dairy: 2 servings a day Fish, poultry, or lean meat: 1 serving a day Beans and legumes: 2 or more servings a week Nuts and seeds: 1-2 servings a day Whole grains: 6-8 servings a day Extra-virgin olive oil: 3-4 servings a day Limit red meat and sweets to only a few servings a month What are my food choices? Mediterranean diet Recommended Grains: Whole-grain pasta. Brown rice. Bulgar wheat. Polenta. Couscous. Whole-wheat bread. Orpah Cobb. Vegetables: Artichokes. Beets. Broccoli. Cabbage. Carrots. Eggplant. Green beans. Chard. Kale. Spinach. Onions. Leeks. Peas. Squash. Tomatoes. Peppers. Radishes. Fruits: Apples. Apricots. Avocado. Berries. Bananas. Cherries. Dates. Figs. Grapes. Lemons. Melon. Oranges. Peaches. Plums. Pomegranate. Meats and other protein foods: Beans. Almonds. Sunflower seeds. Pine nuts. Peanuts. Cod. Salmon. Scallops. Shrimp. Tuna. Tilapia. Clams. Oysters. Eggs. Dairy: Low-fat milk. Cheese. Greek yogurt. Beverages: Water. Red wine. Herbal tea. Fats and oils: Extra virgin olive oil. Avocado oil. Grape seed oil. Sweets and desserts: [place] yogurt with honey. Baked apples. Poached pears. Trail mix. Seasoning and other foods: Basil. Cilantro. Coriander. Cumin. Mint. Parsley. Sage. Rosemary. Tarragon. Garlic. Oregano. Thyme. Pepper. Balsalmic vinegar. Tahini. Hummus. Tomato sauce. Olives. Mushrooms. Limit these Grains: Prepackaged pasta or rice dishes. Prepackaged cereal with added sugar. Vegetables: Deep fried potatoes (french fries). Fruits: Fruit canned in syrup. Meats and other protein foods: Beef. Pork. Lamb. Poultry with skin. Hot dogs. Tomasa Blase. Dairy: Ice cream. Sour cream. Whole milk. Beverages: Juice. Sugar-sweetened soft  drinks. Beer. Liquor and spirits. Fats and oils: Butter. Canola oil. Vegetable oil. Beef fat (tallow). Lard. Sweets and desserts: Cookies. Cakes. Pies. Candy. Seasoning and other foods: Mayonnaise. Premade sauces and marinades. The items listed may not be a complete list. Talk with your dietitian about what dietary choices are right for you. Summary The Mediterranean diet includes both food and lifestyle choices. Eat a variety of fresh fruits and vegetables, beans, nuts, seeds, and whole grains. Limit the amount of red meat and sweets that you eat. Talk with your health care provider about whether it is safe for you to drink red wine in moderation. This means 1 glass a day for nonpregnant women and 2 glasses a day for men. A glass of wine equals 5 oz (150 mL). This information is not intended to replace advice given to you by your health care provider. Make sure you discuss any questions you have with your health care provider. Document Released: 01/29/2016 Document Revised: 03/02/2016 Document Reviewed: 01/29/2016 Elsevier Interactive Patient Education  2017 [employer].

## 2023-09-07 ENCOUNTER — Ambulatory Visit
Admission: EM | Admit: 2023-09-07 | Discharge: 2023-09-07 | Disposition: A | Discharge status: Home / Self Care | Attending: Nurse Practitioner | Admitting: Nurse Practitioner

## 2023-09-07 ENCOUNTER — Other Ambulatory Visit: Payer: Self-pay

## 2023-09-07 ENCOUNTER — Encounter: Payer: Self-pay | Admitting: Emergency Medicine

## 2023-09-07 DIAGNOSIS — Z952 Presence of prosthetic heart valve: Secondary | ICD-10-CM

## 2023-09-07 DIAGNOSIS — I2510 Atherosclerotic heart disease of native coronary artery without angina pectoris: Secondary | ICD-10-CM

## 2023-09-07 DIAGNOSIS — H6691 Otitis media, unspecified, right ear: Secondary | ICD-10-CM

## 2023-09-07 DIAGNOSIS — H6993 Unspecified Eustachian tube disorder, bilateral: Secondary | ICD-10-CM

## 2023-09-07 DIAGNOSIS — E782 Mixed hyperlipidemia: Secondary | ICD-10-CM

## 2023-09-07 MED ORDER — AZITHROMYCIN 250 MG PO TABS: ORAL_TABLET | ORAL | 0 refills | Status: DC

## 2023-09-07 MED ORDER — FLUTICASONE PROPIONATE 50 MCG/ACT NA SUSP: 1.0000 | Freq: Every day | NASAL | 0 refills | Status: DC

## 2023-09-07 NOTE — ED Triage Notes (Addendum)
 Pt reports right ear pain x2-3 weeks. Denies any known injury. Has tried lidocaine patch and heating pad with no change in discomfort.

## 2023-09-07 NOTE — Discharge Instructions (Signed)
 There is fluid behind both of your eardrums and you have an ear infection in your right ear.  Take the azithromycin as prescribed to treat the ear infection.  Start using Flonase nasal spray daily to help with the fluid behind your ears.  Seek care if symptoms do not improve with treatment.

## 2023-09-07 NOTE — ED Provider Notes (Signed)
 [facility]    CSN: [id number] Arrival date & time: 09/07/23  1053      History   Chief Complaint Chief Complaint  Patient presents with   Ear Pain    HPI ELYANAH FARINO is a 89 y.o. female.   Patient presents today with daughter for 2 to 3-week history of sharp, shooting right ear pain that comes and goes quickly.  She denies muffled or decreased hearing from the ears.  No recent fever, cough, congestion, or sore throat.  No drainage from the ear.  No recent ear trauma.  Has been applying a heating pad and using lidocaine patches without much improvement.    Past Medical History:  Diagnosis Date   Acute blood loss anemia 03/22/2015   AKI (acute kidney injury)    Anemia    Aortic atherosclerosis 04/26/2023   Bilateral carpal tunnel syndrome 05/15/2018   C2 cervical fracture 04/20/2023   C7 cervical fracture 04/26/2023   Chronic constipation 03/15/2019   Closed left humeral fracture 03/08/2019   Closed right hip fracture (HCC) 03/14/2015   Early cataract    Essential hypertension, benign 03/15/2019   Fracture of femoral neck, left, closed (HCC) 03/08/2019   Headache    Hematuria 06/02/2023   Humerus fracture    Hypomagnesemia 04/26/2023   Insomnia 03/03/2021   Left radial fracture 04/26/2023   Leukocytosis    Major depressive disorder 03/15/2019   Mild dementia 07/18/2023   Osteonecrosis    post-traumatic osteonecrosis of the femoral head of the left hip, retained orthopedic hardware ( order for conset)   Osteoporosis    Peripheral vascular disease    blood clot right leg   Protein-calorie malnutrition, severe 09/21/2019   PVD (peripheral vascular disease) 04/26/2023   Recurrent falls 04/26/2023   No definite cardiac or neurologic prodrome described.  Most recent falls have resulted in displaced fracture of C2 , nondisplaced fracture of C7 as well as a left wrist fracture for which brace was recommended.  SNF placement for rehab has been  pursued.     Rheumatoid arthritis 03/14/2015   S/P TAVR (transcatheter aortic valve replacement) 08/08/2019   Sequelae of open wound of right lower extremity 03/15/2019   Severe aortic stenosis 07/19/2019   Thoracic ascending aortic aneurysm 04/26/2023   4.4 cm by 07/26/19 CTA   Traumatic arthritis of left hip 10/02/2019   Ulcer of lower extremity    Vitamin B12 deficiency 06/02/2023   Wears glasses     Patient Active Problem List   Diagnosis Date Noted   Mild dementia 07/18/2023   Hematuria 06/02/2023   Vitamin B12 deficiency 06/02/2023   Aortic atherosclerosis 04/26/2023   Left radial fracture 04/26/2023   Hypomagnesemia 04/26/2023   PVD (peripheral vascular disease) 04/26/2023   C7 cervical fracture 04/26/2023   Thoracic ascending aortic aneurysm 04/26/2023   Recurrent falls 04/26/2023   C2 cervical fracture 04/20/2023   Cognitive changes 04/20/2023   Insomnia 03/03/2021   Traumatic arthritis of left hip 10/02/2019   Protein-calorie malnutrition, severe 09/21/2019   S/P TAVR (transcatheter aortic valve replacement) 08/08/2019   Severe aortic stenosis 07/19/2019   Essential hypertension, benign 03/15/2019   Major depressive disorder 03/15/2019   Chronic constipation 03/15/2019   Bilateral carpal tunnel syndrome 05/15/2018   Rheumatoid arthritis 03/14/2015    Past Surgical History:  Procedure Laterality Date   ABDOMINAL AORTOGRAM W/LOWER EXTREMITY N/A 05/08/2019   Procedure: ABDOMINAL AORTOGRAM W/LOWER EXTREMITY;  Surgeon: Nada Libman, MD;  Location: [facility];  Service: Cardiovascular;  Laterality: N/A;   CATARACT EXTRACTION W/PHACO Left 10/24/2020   Procedure: CATARACT EXTRACTION PHACO AND INTRAOCULAR LENS PLACEMENT LEFT EYE;  Surgeon: Fabio Pierce, MD;  Location: [facility];  Service: Ophthalmology;  Laterality: Left;  CDE  20.05   CATARACT EXTRACTION W/PHACO Right 11/07/2020   Procedure: CATARACT EXTRACTION PHACO AND INTRAOCULAR LENS PLACEMENT RIGHT EYE;   Surgeon: Fabio Pierce, MD;  Location: [facility];  Service: Ophthalmology;  Laterality: Right;  right, CDE=24.26   HARDWARE REMOVAL Left 10/02/2019   Procedure: HARDWARE REMOVAL;  Surgeon: Kathryne Hitch, MD;  Location: [facility] OR;  Service: Orthopedics;  Laterality: Left;   HIP PINNING,CANNULATED Left 03/09/2019   Procedure: CANNULATED HIP PINNING;  Surgeon: Kathryne Hitch, MD;  Location: [facility] OR;  Service: Orthopedics;  Laterality: Left;   INTRAMEDULLARY (IM) NAIL INTERTROCHANTERIC Right 03/14/2015   Procedure: RIGHT INTERTROCHANTRIC INTRAMEDULLARY (IM) NAIL ;  Surgeon: Kathryne Hitch, MD;  Location: [facility] OR;  Service: Orthopedics;  Laterality: Right;   PERIPHERAL VASCULAR INTERVENTION Right 05/08/2019   Procedure: PERIPHERAL VASCULAR INTERVENTION;  Surgeon: Nada Libman, MD;  Location: [facility];  Service: Cardiovascular;  Laterality: Right;   RIGHT/LEFT HEART CATH AND CORONARY ANGIOGRAPHY N/A 07/19/2019   Procedure: RIGHT/LEFT HEART CATH AND CORONARY ANGIOGRAPHY;  Surgeon: Tonny Bollman, MD;  Location: [facility] INVASIVE CV LAB;  Service: Cardiovascular;  Laterality: N/A;   TEE WITHOUT CARDIOVERSION N/A 08/07/2019   Procedure: TRANSESOPHAGEAL ECHOCARDIOGRAM (TEE);  Surgeon: Tonny Bollman, MD;  Location: [facility] INVASIVE CV LAB;  Service: Open Heart Surgery;  Laterality: N/A;   TOTAL HIP ARTHROPLASTY Left 10/02/2019   Procedure: LEFT TOTAL HIP ARTHROPLASTY ANTERIOR APPROACH AND REMOVAL OF CANNULATED SCREWS OF LEFT HIP;  Surgeon: Kathryne Hitch, MD;  Location: [facility] OR;  Service: Orthopedics;  Laterality: Left;   TRANSCATHETER AORTIC VALVE REPLACEMENT, TRANSFEMORAL  08/07/2019   TRANSCATHETER AORTIC VALVE REPLACEMENT, TRANSFEMORAL N/A 08/07/2019   Procedure: TRANSCATHETER AORTIC VALVE REPLACEMENT, TRANSFEMORAL;  Surgeon: Tonny Bollman, MD;  Location: [facility] INVASIVE CV LAB;  Service: Open Heart Surgery;  Laterality: N/A;    OB History   No obstetric history on file.       Home Medications    Prior to Admission medications   Medication Sig Start Date End Date Taking? Authorizing Provider  azithromycin (ZITHROMAX) 250 MG tablet Take (2) tablets by mouth on day 1, then take (1) tablet by mouth on days 2-5. 09/07/23  Yes Valentino Nose, NP  fluticasone (FLONASE) 50 MCG/ACT nasal spray Place 1 spray into both nostrils daily. 09/07/23  Yes Valentino Nose, NP  acetaminophen (TYLENOL) 325 MG tablet Take 2 tablets (650 mg total) by mouth every 6 (six) hours as needed for mild pain (or Fever >/= 101). 03/17/15   Rizwan, Ladell Heads, MD  amLODipine (NORVASC) 5 MG tablet TAKE 1 TABLET BY MOUTH DAILY. 04/18/23   de [place], Buren Kos, MD  azithromycin (ZITHROMAX) 500 MG tablet Take 1 tablet (500 mg) one hour prior to all dental visits. 03/09/22   Tonny Bollman, MD  Boswellia-Glucosamine-Vit D (OSTEO BI-FLEX ONE PER DAY PO) Take 1 tablet by mouth daily.    [provider]  Cholecalciferol (VITAMIN D-3) 125 MCG (5000 UT) TABS Take 1 tablet by mouth daily. 09/17/19   Hilts, Casimiro Needle, MD  clopidogrel (PLAVIX) 75 MG tablet TAKE (1) TABLET BY MOUTH DAILY. 08/19/23   de [place], Raymond J, MD  cyanocobalamin (VITAMIN B12) 500 MCG tablet Take 1 tablet by mouth once daily 08/19/23  de [place], Buren Kos, MD  Glucosamine-Chondroitin 500-400 MG CAPS TAKE (1) CAPSULE BY MOUTH DAILY. 06/21/23   de [place], Raymond J, MD  Lactobacillus (PROBIOTIC ACIDOPHILUS PO) Take 1 tablet by mouth daily.    [provider]  magnesium oxide (MAG-OX) 400 (240 Mg) MG tablet TAKE 1 TABLET(400 MG) BY MOUTH DAILY 04/18/23   de [place], Buren Kos, MD  Probiotic Product (RISA-BID PROBIOTIC) TABS TAKE (1) TABLET BY MOUTH DAILY. 06/21/23   de [place], Raymond J, MD  sertraline (ZOLOFT) 100 MG tablet TAKE (1_1/2) TABLETS BY MOUTH DAILY. 06/21/23   de [place], Buren Kos, MD    Family History Family History  Problem Relation Age of Onset   Cancer Sister    Depression Other        multiple family members     Social History Social History   Tobacco Use   Smoking status: Never    Passive exposure: Never   Smokeless tobacco: Never  Vaping Use   Vaping status: Never Used  Substance Use Topics   Alcohol use: No   Drug use: No     Allergies   Penicillins and Codeine   Review of Systems Review of Systems Per HPI  Physical Exam Triage Vital Signs ED Triage Vitals  Encounter Vitals Group     BP 09/07/23 1213 (!) 143/81     Systolic BP Percentile --      Diastolic BP Percentile --      Pulse Rate 09/07/23 1213 70     Resp 09/07/23 1213 20     Temp 09/07/23 1213 99.1 F (37.3 C)     Temp Source 09/07/23 1213 Oral     SpO2 09/07/23 1213 96 %     Weight --      Height --      Head Circumference --      Peak Flow --      Pain Score 09/07/23 1210 8     Pain Loc --      Pain Education --      Exclude from Growth Chart --    No data found.  Updated Vital Signs BP (!) 143/81 (BP Location: Right Arm)   Pulse 70   Temp 99.1 F (37.3 C) (Oral)   Resp 20   SpO2 96%   Visual Acuity Right Eye Distance:   Left Eye Distance:   Bilateral Distance:    Right Eye Near:   Left Eye Near:    Bilateral Near:     Physical Exam Vitals and nursing note reviewed.  Constitutional:      General: She is not in acute distress.    Appearance: Normal appearance. She is not toxic-appearing.  HENT:     Head: Normocephalic and atraumatic.     Right Ear: No decreased hearing noted. A middle ear effusion is present. Tympanic membrane is erythematous. Tympanic membrane is not bulging.     Left Ear: No decreased hearing noted. A middle ear effusion is present. Tympanic membrane is not erythematous or bulging.     Nose: Nose normal. No congestion or rhinorrhea.     Mouth/Throat:     Mouth: Mucous membranes are moist.     Pharynx: Oropharynx is clear.  Cardiovascular:     Rate and Rhythm: Normal rate.  Pulmonary:     Effort: Pulmonary effort is normal. No respiratory distress.   Musculoskeletal:     Cervical back: Normal range of motion.  Lymphadenopathy:     Cervical: No cervical  adenopathy.  Neurological:     Mental Status: She is alert and oriented to person, place, and time.  Psychiatric:        Behavior: Behavior is cooperative.      UC Treatments / Results  Labs (all labs ordered are listed, but only abnormal results are displayed) Labs Reviewed - No data to display  EKG   Radiology No results found.  Procedures Procedures (including critical care time)  Medications Ordered in UC Medications - No data to display  Initial Impression / Assessment and Plan / UC Course  I have reviewed the triage vital signs and the nursing notes.  Pertinent labs & imaging results that were available during my care of the patient were reviewed by me and considered in my medical decision making (see chart for details).   Patient is well-appearing, normotensive, afebrile, not tachycardic, not tachypneic, oxygenating well on room air.    1. Acute dysfunction of Eustachian tube, bilateral 2. Acute otitis media, right Given history of allergy to penicillin, will treat with azithromycin; no renal dose needed Start Flonase to help with bilateral effusions Return and ER precautions discussed  The patient was given the opportunity to ask questions.  All questions answered to their satisfaction.  The patient is in agreement to this plan.   Final Clinical Impressions(s) / UC Diagnoses   Final diagnoses:  Acute dysfunction of Eustachian tube, bilateral  Acute otitis media, right     Discharge Instructions      There is fluid behind both of your eardrums and you have an ear infection in your right ear.  Take the azithromycin as prescribed to treat the ear infection.  Start using Flonase nasal spray daily to help with the fluid behind your ears.  Seek care if symptoms do not improve with treatment.   ED Prescriptions     Medication Sig Dispense Auth. Provider    azithromycin (ZITHROMAX) 250 MG tablet Take (2) tablets by mouth on day 1, then take (1) tablet by mouth on days 2-5. 6 tablet Cathlean Marseilles A, NP   fluticasone (FLONASE) 50 MCG/ACT nasal spray Place 1 spray into both nostrils daily. 16 g Valentino Nose, NP      PDMP not reviewed this encounter.   Valentino Nose, NP 09/07/23 1245

## 2023-09-08 ENCOUNTER — Ambulatory Visit (HOSPITAL_BASED_OUTPATIENT_CLINIC_OR_DEPARTMENT_OTHER): Payer: Medicare Other | Admitting: Family Medicine

## 2023-09-09 ENCOUNTER — Other Ambulatory Visit (HOSPITAL_BASED_OUTPATIENT_CLINIC_OR_DEPARTMENT_OTHER): Payer: Self-pay | Admitting: Family Medicine

## 2023-09-09 ENCOUNTER — Ambulatory Visit: Payer: Self-pay | Admitting: *Deleted

## 2023-09-09 MED ORDER — CEFDINIR 300 MG PO CAPS: 300.0000 mg | ORAL_CAPSULE | Freq: Two times a day (BID) | ORAL | 0 refills | Status: DC

## 2023-09-09 NOTE — Addendum Note (Signed)
 Addended by: DE Peru, Don Tiu J on: 09/09/2023 11:25 AM   Modules accepted: Orders

## 2023-09-09 NOTE — Telephone Encounter (Signed)
 Michaela Simpson: calling to reports and request Chief Complaint: ear pain- not better on antibiotic- requesting possible change in medication, ear drops to help with pain Symptoms: sharp pain in ear- patient was treated at [facility]- not getting better on prescribed medication  Frequency: UC 3/19 Pertinent Negatives: Patient denies fever Disposition: [] ED /[] Urgent Care (no appt availability in office) / [] Appointment(In office/virtual)/ []  [place] [facility]/ [] Home Care/ [] Refused Recommended Disposition /[] [place] [facility]/ [x]  Follow-up with PCP Additional Notes: Caller advised of policy and that PCP may want to see patient to change Rx- request was made that PCP review record/UC visit- possible change medication and add ear drop for pain. If patient has to be seen- she can bring late afternoon   Copied from CRM [id number]. Topic: Clinical - Red Word Triage >> Sep 09, 2023  9:03 AM [name] wrote: [facility] that prompted transfer to Nurse Triage: pt having bad ear pain left ear Reason for Disposition  [1] Taking antibiotics > 24 hours AND [2] symptoms WORSE  Answer Assessment - Initial Assessment Questions 1. INFECTION: "What infection is the antibiotic being given for?"     Ear infection 2. ANTIBIOTIC: "What antibiotic are you taking" "How many times per day?"     azithromycin (ZITHROMAX) 250 MG tablet 3. DURATION: "When was the antibiotic started?"     Started 3/19 4. MAIN CONCERN OR SYMPTOM:  "What is your main concern right now?"     Pain not better 5. BETTER-SAME-WORSE: "Are you getting better, staying the same, or getting worse compared to when you first started the antibiotics?" If getting worse, ask: "In what way?"      worse 6. FEVER: "Do you have a fever?" If Yes, ask: "What is your temperature, how was it measured, and when did it start?"     Unknown- but no fever at UC 7. SYMPTOMS: "Are there any other symptoms you're concerned about?" If Yes, ask: "When did it  start?"     Sharp pain 8. FOLLOW-UP APPOINTMENT: "Do you have a follow-up appointment with your doctor?"     no  Protocols used: Infection on Antibiotic Follow-up Call-[facility]

## 2023-09-09 NOTE — Telephone Encounter (Signed)
 Pt daughter dawn advised with verbal understanding

## 2023-09-13 ENCOUNTER — Encounter (HOSPITAL_BASED_OUTPATIENT_CLINIC_OR_DEPARTMENT_OTHER): Payer: Medicare Other

## 2023-09-16 ENCOUNTER — Other Ambulatory Visit (HOSPITAL_BASED_OUTPATIENT_CLINIC_OR_DEPARTMENT_OTHER): Payer: Self-pay | Admitting: Family Medicine

## 2023-09-16 DIAGNOSIS — Z0000 Encounter for general adult medical examination without abnormal findings: Secondary | ICD-10-CM

## 2023-09-19 ENCOUNTER — Encounter (HOSPITAL_BASED_OUTPATIENT_CLINIC_OR_DEPARTMENT_OTHER): Payer: Self-pay

## 2023-11-08 DIAGNOSIS — H43391 Other vitreous opacities, right eye: Secondary | ICD-10-CM | POA: Diagnosis not present

## 2023-11-16 ENCOUNTER — Other Ambulatory Visit: Payer: Self-pay | Admitting: Internal Medicine

## 2023-12-02 ENCOUNTER — Encounter (HOSPITAL_BASED_OUTPATIENT_CLINIC_OR_DEPARTMENT_OTHER): Payer: Self-pay | Admitting: Family Medicine

## 2023-12-02 NOTE — Telephone Encounter (Signed)
 Please see mychart message sent by pt's daughter about upcoming visit and advise.

## 2023-12-05 ENCOUNTER — Ambulatory Visit (HOSPITAL_BASED_OUTPATIENT_CLINIC_OR_DEPARTMENT_OTHER): Admitting: Family Medicine

## 2023-12-05 ENCOUNTER — Encounter (HOSPITAL_BASED_OUTPATIENT_CLINIC_OR_DEPARTMENT_OTHER): Payer: Self-pay | Admitting: Family Medicine

## 2023-12-05 VITALS — BP 169/83 | HR 71 | Ht 62.0 in | Wt 112.4 lb

## 2023-12-05 DIAGNOSIS — R4189 Other symptoms and signs involving cognitive functions and awareness: Secondary | ICD-10-CM | POA: Diagnosis not present

## 2023-12-05 DIAGNOSIS — Z0000 Encounter for general adult medical examination without abnormal findings: Secondary | ICD-10-CM | POA: Diagnosis not present

## 2023-12-05 DIAGNOSIS — E538 Deficiency of other specified B group vitamins: Secondary | ICD-10-CM | POA: Diagnosis not present

## 2023-12-05 DIAGNOSIS — I10 Essential (primary) hypertension: Secondary | ICD-10-CM

## 2023-12-05 NOTE — Patient Instructions (Signed)
  Medication Instructions:  Your physician recommends that you continue on your current medications as directed. Please refer to the Current Medication list given to you today. --If you need a refill on any your medications before your next appointment, please call your pharmacy first. If no refills are authorized on file call the office.-- Lab Work: Your physician has recommended that you have lab work today: 1 week before next visit  If you have labs (blood work) drawn today and your tests are completely normal, you will receive your results via MyChart message OR a phone call from our staff.  Please ensure you check your voicemail in the event that you authorized detailed messages to be left on a delegated number. If you have any lab test that is abnormal or we need to change your treatment, we will call you to review the results.  Follow-Up: Your next appointment:   Your physician recommends that you schedule a follow-up appointment in: 6 month physical / needs medicare wellness visit scheduled with Dr. de Peru  You will receive a text message or e-mail with a link to a survey about your care and experience with us  today! We would greatly appreciate your feedback!   Thanks for letting us  be apart of your health journey!!  Primary Care and Sports Medicine   Dr. Court Distance Peru   We encourage you to activate your patient portal called MyChart.  Sign up information is provided on this After Visit Summary.  MyChart is used to connect with patients for Virtual Visits (Telemedicine).  Patients are able to view lab/test results, encounter notes, upcoming appointments, etc.  Non-urgent messages can be sent to your provider as well. To learn more about what you can do with MyChart, please visit --  [email or web address].

## 2023-12-05 NOTE — Progress Notes (Unsigned)
 Procedures performed today:    None.  Independent interpretation of notes and tests performed by another provider:   None.  Brief History, Exam, Impression, and Recommendations:    BP (!) 169/83 (BP Location: Left Arm, Patient Position: Sitting, Cuff Size: Normal)   Pulse 71   Ht 5' 2 (1.575 m)   Wt 112 lb 6.4 oz (51 kg)   SpO2 100%   BMI 20.56 kg/m   Patient accompanied to office today by her daughter.  There does appear to be some concern as they are uncertain as to the reason for this follow-up visit.  At last appointment for routine follow-up, it was recommended to follow-up in 3 months in order to monitor progress, however this appointment had been adjusted as there were other concerns that had come up and these were addressed at acute visit.  Ultimately, it appears that this appointment was rescheduled through patient's portal on MyChart and discussed as much with patient and daughter.  There are also some frustrations that this appointment could not be changed into her annual wellness visit while she was here.  Discussed typical requirements for this and that this is something that can be arranged in the future for patient. Ultimately we did review in detail problems as outlined below.  Daughter still seemed to question reasoning for appointment despite topics covered, medications reviewed, blood pressure reviewed and recommendations moving forward. Ultimately, we will plan for appointment in about 5 to 6 months to follow-up on prior appointments with specialist as well as labs which patient will return to have done about a week before next appointment.  Essential hypertension, benign Assessment & Plan: Has not been checking blood pressure at home.  It is elevated in office today.  She does continue with amlodipine .  Denies any side effects from medication.  She does follow with cardiology, last visit was in September 2024 with recommendation for 1 year follow-up.  In review of  upcoming appointments, it does not appear that this is scheduled as of yet. Discussed considerations.  For now, we can continue with monitoring due to reluctance to changing any medication.  Recommend scheduling appointment with cardiology for 1 year follow-up.  Recommend monitoring of blood pressure at home, this was also written to paperwork brought by patient from assisted living facility.  Orders: -     CBC with Differential/Platelet; Future -     Comprehensive metabolic panel with GFR; Future -     Lipid panel; Future  Cognitive changes Assessment & Plan: Continues to follow with neurology.  There did appear to be some confusion about providers and follow-up.  Attempted to provide correct information in this regard.  Instructed that Marylene Snuffer who patient has been seeing is with neurology whereas Dr. Kitty Perkins is a neuropsychologist who is more involved and underlying assessment of neurocognitive functioning.  Also discussed indication in the chart for plan to repeat neurocognitive assessment in order to monitor for any progressive changes.  It does appear that appointment is scheduled with neurology in the future, however she will likely need to arrange for neurocognitive assessment which may be done after upcoming appointment with neurology.   Wellness examination -     CBC with Differential/Platelet; Future -     Comprehensive metabolic panel with GFR; Future -     Lipid panel; Future -     Vitamin B12; Future  Vitamin B12 deficiency Assessment & Plan: Prior labs were slightly above normal range.  We will plan to  recheck B12 level again with upcoming labs for monitoring.  Orders: -     Vitamin B12; Future  Return in about 6 months (around 06/05/2024) for CPE with fasting labs 1 week prior.  Spent 32 minutes on this patient encounter, including preparation, chart review, face-to-face counseling with patient and coordination of care, and documentation of  encounter   ___________________________________________ Mairany Bruno de [place], MD, ABFM, [facility] Primary Care and Sports Medicine [facility]

## 2023-12-06 NOTE — Assessment & Plan Note (Signed)
 Prior labs were slightly above normal range.  We will plan to recheck B12 level again with upcoming labs for monitoring.

## 2023-12-06 NOTE — Assessment & Plan Note (Signed)
 Continues to follow with neurology.  There did appear to be some confusion about providers and follow-up.  Attempted to provide correct information in this regard.  Instructed that Marylene Snuffer who patient has been seeing is with neurology whereas Dr. Kitty Perkins is a neuropsychologist who is more involved and underlying assessment of neurocognitive functioning.  Also discussed indication in the chart for plan to repeat neurocognitive assessment in order to monitor for any progressive changes.  It does appear that appointment is scheduled with neurology in the future, however she will likely need to arrange for neurocognitive assessment which may be done after upcoming appointment with neurology.

## 2023-12-06 NOTE — Assessment & Plan Note (Signed)
 Has not been checking blood pressure at home.  It is elevated in office today.  She does continue with amlodipine .  Denies any side effects from medication.  She does follow with cardiology, last visit was in September 2024 with recommendation for 1 year follow-up.  In review of upcoming appointments, it does not appear that this is scheduled as of yet. Discussed considerations.  For now, we can continue with monitoring due to reluctance to changing any medication.  Recommend scheduling appointment with cardiology for 1 year follow-up.  Recommend monitoring of blood pressure at home, this was also written to paperwork brought by patient from assisted living facility.

## 2024-01-27 ENCOUNTER — Ambulatory Visit

## 2024-01-28 ENCOUNTER — Ambulatory Visit
Admission: EM | Admit: 2024-01-28 | Discharge: 2024-01-28 | Disposition: A | Discharge status: Home / Self Care | Attending: Family Medicine | Admitting: Family Medicine

## 2024-01-28 ENCOUNTER — Telehealth: Payer: Self-pay

## 2024-01-28 DIAGNOSIS — R030 Elevated blood-pressure reading, without diagnosis of hypertension: Secondary | ICD-10-CM

## 2024-01-28 DIAGNOSIS — H6991 Unspecified Eustachian tube disorder, right ear: Secondary | ICD-10-CM

## 2024-01-28 MED ORDER — CORICIDIN HBP 10-325-2 MG PO TABS: 1.0000 | ORAL_TABLET | Freq: Three times a day (TID) | ORAL | 0 refills | Status: DC | PRN

## 2024-01-28 MED ORDER — AZELASTINE HCL 0.1 % NA SOLN: 1.0000 | Freq: Two times a day (BID) | NASAL | 0 refills | Status: DC

## 2024-01-28 MED ORDER — CORICIDIN HBP 10-325-2 MG PO TABS
1.0000 | ORAL_TABLET | Freq: Three times a day (TID) | ORAL | 0 refills | Status: DC | PRN
Start: 2024-01-28 — End: 2024-01-28

## 2024-01-28 MED ORDER — AZELASTINE HCL 0.1 % NA SOLN: 1.0000 | Freq: Two times a day (BID) | NASAL | 0 refills | Status: AC

## 2024-01-28 NOTE — Discharge Instructions (Signed)
 I have prescribed a new nasal spray that you may take in addition to the Flonase  and some Coricidin HBP to act as a decongestant.  They should help with the increased pressure in your right ear causing your pain.  Follow-up for worsening or unresolving symptoms.

## 2024-01-28 NOTE — Telephone Encounter (Signed)
 Patients caregiver called and states the pharmacy didn't receive the prescriptions that we sent. Prescriptions resent  to the pharmacy.

## 2024-01-28 NOTE — ED Provider Notes (Signed)
 [facility]    CSN: [id number] Arrival date & time: 01/28/24  1230      History   Chief Complaint Chief Complaint  Patient presents with   Otalgia    HPI Michaela Simpson is a 89 y.o. female.   Patient presenting today with at least 1 week history of progressively worsening right ear pain.  Denies bleeding, drainage, loss of hearing, history of recent colds, congestion.  So far trying Tylenol  with mild temporary benefit.    Past Medical History:  Diagnosis Date   Acute blood loss anemia 03/22/2015   AKI (acute kidney injury)    Anemia    Aortic atherosclerosis 04/26/2023   Bilateral carpal tunnel syndrome 05/15/2018   C2 cervical fracture 04/20/2023   C7 cervical fracture 04/26/2023   Chronic constipation 03/15/2019   Closed left humeral fracture 03/08/2019   Closed right hip fracture (HCC) 03/14/2015   Early cataract    Essential hypertension, benign 03/15/2019   Fracture of femoral neck, left, closed (HCC) 03/08/2019   Headache    Hematuria 06/02/2023   Humerus fracture    Hypomagnesemia 04/26/2023   Insomnia 03/03/2021   Left radial fracture 04/26/2023   Leukocytosis    Major depressive disorder 03/15/2019   Mild dementia 07/18/2023   Osteonecrosis    post-traumatic osteonecrosis of the femoral head of the left hip, retained orthopedic hardware ( order for conset)   Osteoporosis    Peripheral vascular disease    blood clot right leg   Protein-calorie malnutrition, severe 09/21/2019   PVD (peripheral vascular disease) 04/26/2023   Recurrent falls 04/26/2023   No definite cardiac or neurologic prodrome described.  Most recent falls have resulted in displaced fracture of C2 , nondisplaced fracture of C7 as well as a left wrist fracture for which brace was recommended.  SNF placement for rehab has been pursued.     Rheumatoid arthritis 03/14/2015   S/P TAVR (transcatheter aortic valve replacement) 08/08/2019   Sequelae of open wound of right  lower extremity 03/15/2019   Severe aortic stenosis 07/19/2019   Thoracic ascending aortic aneurysm 04/26/2023   4.4 cm by 07/26/19 CTA   Traumatic arthritis of left hip 10/02/2019   Ulcer of lower extremity    Vitamin B12 deficiency 06/02/2023   Wears glasses     Patient Active Problem List   Diagnosis Date Noted   Mild dementia 07/18/2023   Hematuria 06/02/2023   Vitamin B12 deficiency 06/02/2023   Aortic atherosclerosis 04/26/2023   Left radial fracture 04/26/2023   Hypomagnesemia 04/26/2023   PVD (peripheral vascular disease) 04/26/2023   C7 cervical fracture 04/26/2023   Thoracic ascending aortic aneurysm 04/26/2023   Recurrent falls 04/26/2023   C2 cervical fracture 04/20/2023   Cognitive changes 04/20/2023   Insomnia 03/03/2021   Traumatic arthritis of left hip 10/02/2019   Protein-calorie malnutrition, severe 09/21/2019   S/P TAVR (transcatheter aortic valve replacement) 08/08/2019   Severe aortic stenosis 07/19/2019   Essential hypertension, benign 03/15/2019   Major depressive disorder 03/15/2019   Chronic constipation 03/15/2019   Bilateral carpal tunnel syndrome 05/15/2018   Rheumatoid arthritis 03/14/2015    Past Surgical History:  Procedure Laterality Date   ABDOMINAL AORTOGRAM W/LOWER EXTREMITY N/A 05/08/2019   Procedure: ABDOMINAL AORTOGRAM W/LOWER EXTREMITY;  Surgeon: Serene Gaile ORN, MD;  Location: [facility];  Service: Cardiovascular;  Laterality: N/A;   CATARACT EXTRACTION W/PHACO Left 10/24/2020   Procedure: CATARACT EXTRACTION PHACO AND INTRAOCULAR LENS PLACEMENT LEFT EYE;  Surgeon: Harrie Agent,  MD;  Location: [facility];  Service: Ophthalmology;  Laterality: Left;  CDE  20.05   CATARACT EXTRACTION W/PHACO Right 11/07/2020   Procedure: CATARACT EXTRACTION PHACO AND INTRAOCULAR LENS PLACEMENT RIGHT EYE;  Surgeon: Harrie Agent, MD;  Location: [facility];  Service: Ophthalmology;  Laterality: Right;  right, CDE=24.26   HARDWARE REMOVAL Left 10/02/2019    Procedure: HARDWARE REMOVAL;  Surgeon: Vernetta Lonni GRADE, MD;  Location: [facility] OR;  Service: Orthopedics;  Laterality: Left;   HIP PINNING,CANNULATED Left 03/09/2019   Procedure: CANNULATED HIP PINNING;  Surgeon: Vernetta Lonni GRADE, MD;  Location: [facility] OR;  Service: Orthopedics;  Laterality: Left;   INTRAMEDULLARY (IM) NAIL INTERTROCHANTERIC Right 03/14/2015   Procedure: RIGHT INTERTROCHANTRIC INTRAMEDULLARY (IM) NAIL ;  Surgeon: Lonni GRADE Vernetta, MD;  Location: [facility] OR;  Service: Orthopedics;  Laterality: Right;   PERIPHERAL VASCULAR INTERVENTION Right 05/08/2019   Procedure: PERIPHERAL VASCULAR INTERVENTION;  Surgeon: Serene Gaile ORN, MD;  Location: [facility];  Service: Cardiovascular;  Laterality: Right;   RIGHT/LEFT HEART CATH AND CORONARY ANGIOGRAPHY N/A 07/19/2019   Procedure: RIGHT/LEFT HEART CATH AND CORONARY ANGIOGRAPHY;  Surgeon: Wonda Sharper, MD;  Location: [facility] INVASIVE CV LAB;  Service: Cardiovascular;  Laterality: N/A;   TEE WITHOUT CARDIOVERSION N/A 08/07/2019   Procedure: TRANSESOPHAGEAL ECHOCARDIOGRAM (TEE);  Surgeon: Wonda Sharper, MD;  Location: [facility] INVASIVE CV LAB;  Service: Open Heart Surgery;  Laterality: N/A;   TOTAL HIP ARTHROPLASTY Left 10/02/2019   Procedure: LEFT TOTAL HIP ARTHROPLASTY ANTERIOR APPROACH AND REMOVAL OF CANNULATED SCREWS OF LEFT HIP;  Surgeon: Vernetta Lonni GRADE, MD;  Location: [facility] OR;  Service: Orthopedics;  Laterality: Left;   TRANSCATHETER AORTIC VALVE REPLACEMENT, TRANSFEMORAL  08/07/2019   TRANSCATHETER AORTIC VALVE REPLACEMENT, TRANSFEMORAL N/A 08/07/2019   Procedure: TRANSCATHETER AORTIC VALVE REPLACEMENT, TRANSFEMORAL;  Surgeon: Wonda Sharper, MD;  Location: [facility] INVASIVE CV LAB;  Service: Open Heart Surgery;  Laterality: N/A;    OB History   No obstetric history on file.      Home Medications    Prior to Admission medications   Medication Sig Start Date End Date Taking? Authorizing Provider  azelastine  (ASTELIN ) 0.1  % nasal spray Place 1 spray into both nostrils 2 (two) times daily. Use in each nostril as directed 01/28/24  Yes Stuart Vernell Norris, PA-C  DM-APAP-CPM (CORICIDIN HBP) 10-325-2 MG TABS Take 1 tablet by mouth 3 (three) times daily as needed. 01/28/24  Yes Stuart Vernell Norris, PA-C  acetaminophen  (TYLENOL ) 325 MG tablet TAKE (2)TABLETS BY MOUTH EVERY 6 HOURS AS NEEDED FOR PAIN OR FEVER GREATER THAN 101 11/17/23   de [place], Quintin PARAS, MD  amLODipine  (NORVASC ) 5 MG tablet TAKE (1) TABLET BY MOUTH DAILY. 09/19/23   de [place], Quintin PARAS, MD  clopidogrel  (PLAVIX ) 75 MG tablet TAKE (1) TABLET BY MOUTH DAILY. 08/19/23   de [place], Raymond J, MD  cyanocobalamin  (VITAMIN B12) 500 MCG tablet Take 1 tablet by mouth once daily 08/19/23   de [place], Quintin PARAS, MD  fluticasone  (FLONASE ) 50 MCG/ACT nasal spray Place 1 spray into both nostrils daily. 09/07/23   Chandra Harlene LABOR, NP  Glucosamine-Chondroitin 500-400 MG CAPS TAKE (1) CAPSULE BY MOUTH DAILY. 06/21/23   de [place], Raymond J, MD  Lactobacillus (PROBIOTIC ACIDOPHILUS PO) Take 1 tablet by mouth daily.    [provider]  magnesium  oxide (MAG-OX) 400 (240 Mg) MG tablet TAKE (1) TABLET BY MOUTH DAILY. 09/19/23   de [place], Quintin PARAS, MD  Probiotic Product (RISA-BID PROBIOTIC) TABS TAKE (1) TABLET BY  MOUTH DAILY. 06/21/23   de [place], Quintin PARAS, MD  sertraline  (ZOLOFT ) 100 MG tablet TAKE (1_1/2) TABLETS BY MOUTH DAILY. 06/21/23   de [place], Raymond J, MD    Family History Family History  Problem Relation Age of Onset   Cancer Sister    Depression Other        multiple family members    Social History Social History   Tobacco Use   Smoking status: Never    Passive exposure: Never   Smokeless tobacco: Never  Vaping Use   Vaping status: Never Used  Substance Use Topics   Alcohol use: No   Drug use: No     Allergies   Penicillins and Codeine   Review of Systems Review of Systems PER HPI  Physical Exam Triage Vital Signs ED Triage Vitals   Encounter Vitals Group     BP 01/28/24 1240 (!) 160/96     Girls Systolic BP Percentile --      Girls Diastolic BP Percentile --      Boys Systolic BP Percentile --      Boys Diastolic BP Percentile --      Pulse Rate 01/28/24 1240 76     Resp 01/28/24 1240 16     Temp 01/28/24 1240 97.7 F (36.5 C)     Temp Source 01/28/24 1240 Oral     SpO2 01/28/24 1240 97 %     Weight --      Height --      Head Circumference --      Peak Flow --      Pain Score 01/28/24 1242 9     Pain Loc --      Pain Education --      Exclude from Growth Chart --    No data found.  Updated Vital Signs BP (!) 160/96 (BP Location: Right Arm)   Pulse 76   Temp 97.7 F (36.5 C) (Oral)   Resp 16   SpO2 97%   Visual Acuity Right Eye Distance:   Left Eye Distance:   Bilateral Distance:    Right Eye Near:   Left Eye Near:    Bilateral Near:     Physical Exam Vitals and nursing note reviewed.  Constitutional:      Appearance: Normal appearance.  HENT:     Head: Atraumatic.     Right Ear: External ear normal.     Left Ear: Tympanic membrane and external ear normal.     Ears:     Comments: Right middle ear effusion    Nose: Rhinorrhea present.     Mouth/Throat:     Mouth: Mucous membranes are moist.     Pharynx: Posterior oropharyngeal erythema present.  Eyes:     Extraocular Movements: Extraocular movements intact.     Conjunctiva/sclera: Conjunctivae normal.  Cardiovascular:     Rate and Rhythm: Normal rate.  Pulmonary:     Effort: Pulmonary effort is normal.     Breath sounds: No wheezing or rales.  Musculoskeletal:        General: Normal range of motion.     Cervical back: Normal range of motion and neck supple.  Skin:    General: Skin is warm and dry.  Neurological:     Mental Status: She is alert and oriented to person, place, and time.  Psychiatric:        Mood and Affect: Mood normal.        Thought Content: Thought content normal.  UC Treatments / Results   Labs (all labs ordered are listed, but only abnormal results are displayed) Labs Reviewed - No data to display  EKG   Radiology No results found.  Procedures Procedures (including critical care time)  Medications Ordered in UC Medications - No data to display  Initial Impression / Assessment and Plan / UC Course  I have reviewed the triage vital signs and the nursing notes.  Pertinent labs & imaging results that were available during my care of the patient were reviewed by me and considered in my medical decision making (see chart for details).     No evidence of a bacterial ear infection, consistent with eustachian tube dysfunction.  Treat with Astelin  nasal spray, Coricidin HBP to avoid further elevation of blood pressure, supportive over-the-counter medications and home care.  Return for worsening symptoms. Final Clinical Impressions(s) / UC Diagnoses   Final diagnoses:  Acute dysfunction of right eustachian tube  Elevated blood pressure reading     Discharge Instructions      I have prescribed a new nasal spray that you may take in addition to the Flonase  and some Coricidin HBP to act as a decongestant.  They should help with the increased pressure in your right ear causing your pain.  Follow-up for worsening or unresolving symptoms.    ED Prescriptions     Medication Sig Dispense Auth. Provider   azelastine  (ASTELIN ) 0.1 % nasal spray Place 1 spray into both nostrils 2 (two) times daily. Use in each nostril as directed 30 mL Stuart Vernell Norris, PA-C   DM-APAP-CPM (CORICIDIN HBP) 10-325-2 MG TABS Take 1 tablet by mouth 3 (three) times daily as needed. 15 tablet Stuart Vernell Norris, [place]      PDMP not reviewed this encounter.   Stuart Vernell Norris, [place] 01/28/24 1258

## 2024-01-28 NOTE — ED Triage Notes (Signed)
 Pt states right ear pain for the past week. States she has been taking tylenol  at home with some relief.

## 2024-02-01 DIAGNOSIS — R296 Repeated falls: Secondary | ICD-10-CM | POA: Diagnosis not present

## 2024-02-01 DIAGNOSIS — I10 Essential (primary) hypertension: Secondary | ICD-10-CM | POA: Diagnosis not present

## 2024-02-01 DIAGNOSIS — F03A0 Unspecified dementia, mild, without behavioral disturbance, psychotic disturbance, mood disturbance, and anxiety: Secondary | ICD-10-CM | POA: Diagnosis not present

## 2024-02-01 DIAGNOSIS — F329 Major depressive disorder, single episode, unspecified: Secondary | ICD-10-CM | POA: Diagnosis not present

## 2024-02-13 ENCOUNTER — Other Ambulatory Visit: Payer: Self-pay

## 2024-02-13 DIAGNOSIS — Z952 Presence of prosthetic heart valve: Secondary | ICD-10-CM

## 2024-02-14 ENCOUNTER — Ambulatory Visit (HOSPITAL_BASED_OUTPATIENT_CLINIC_OR_DEPARTMENT_OTHER)

## 2024-02-17 DIAGNOSIS — M069 Rheumatoid arthritis, unspecified: Secondary | ICD-10-CM | POA: Diagnosis not present

## 2024-02-17 DIAGNOSIS — F329 Major depressive disorder, single episode, unspecified: Secondary | ICD-10-CM | POA: Diagnosis not present

## 2024-02-24 ENCOUNTER — Institutional Professional Consult (permissible substitution): Payer: Medicare Other | Admitting: Psychology

## 2024-02-24 ENCOUNTER — Ambulatory Visit: Payer: Self-pay

## 2024-03-06 ENCOUNTER — Encounter: Payer: Medicare Other | Admitting: Psychology

## 2024-03-08 ENCOUNTER — Ambulatory Visit: Admitting: Physician Assistant

## 2024-03-13 NOTE — Progress Notes (Unsigned)
 Cardiology Office Note   Date:  03/14/2024  ID:  [name], [name] [birth year], MRN [id number] PCP: de [place], Raymond J, MD  [facility] Providers Cardiologist:  Ozell Fell, MD   History of Present Illness Michaela Simpson is a 89 y.o. female with a past medical history of severe aortic stenosis, hypertension, rheumatic arthritis, and peripheral arterial disease here for follow-up plan.  Underwent TAVR in 2021 without complication.  Patient was treated with a 23 mm Edwards SAPIEN 3 and was noted to have normal bioprosthetic aortic valve function on postoperative echo imaging.  Patient was seen last year and was doing well from a cardiovascular perspective.  Denied any chest pain, palpitations, or leg edema.  When she was out working in her yard she felt tired and would come back in.  However, denies any exertional dyspnea or lightheadedness.  Has some lower extremity arterial intervention with stenting performed a few years back to treat a nonhealing ulceration.  Has also been on DAPT ever since that time.  Has not had a recent follow-up with vascular.  Today, she presents with aortic valve replacement and arterial disease for a cardiovascular follow-up.  She has no current issues such as shortness of breath or chest pain. The status of her aortic valve is unknown as recent echocardiogram results are pending.  Arterial disease in her legs limits her ability to walk long distances. She can walk with some limitations but feels tired after walking and rests as needed. No claudication pain is present, but her legs are weaker than ten years ago.  Current medications include amlodipine  for blood pressure and Plavix . She also takes Zoloft  and various vitamins. An antihistamine nasal spray is used, possibly for allergies.  Discussed the use of AI scribe software for clinical note transcription with the patient, who gave verbal consent to proceed.  Reports no shortness of  breath nor dyspnea on exertion. Reports no chest pain, pressure, or tightness. No edema, orthopnea, PND. Reports no palpitations.    ROS: Pertinent ROS in HPI  Studies Reviewed      Echo: 1. Left ventricular ejection fraction, by estimation, is 55 to 60%. The  left ventricle has normal function. The left ventricle has no regional  wall motion abnormalities. Left ventricular diastolic parameters are  consistent with Grade I diastolic  dysfunction (impaired relaxation).   2. Right ventricular systolic function is normal. The right ventricular  size is normal.   3. A small pericardial effusion is present. There is no evidence of  cardiac tamponade.   4. The mitral valve is normal in structure. No evidence of mitral valve  regurgitation. No evidence of mitral stenosis.   5. The aortic valve has been repaired/replaced. Aortic valve  regurgitation is not visualized. No aortic stenosis is present. There is a  23 mm Sapien prosthetic (TAVR) valve present in the aortic position.  Procedure Date: 08/08/2019. Echo findings are  consistent with normal structure and function of the aortic valve  prosthesis. Aortic valve area, by VTI measures 1.15 cm. Aortic valve mean  gradient measures 7.0 mmHg. Aortic valve Vmax measures 1.73 m/s.   6. Aortic dilatation noted. There is mild dilatation of the ascending  aorta, measuring 42 mm.   7. The inferior vena cava is normal in size with greater than 50%  respiratory variability, suggesting right atrial pressure of 3 mmHg.   Physical Exam VS:  Ht 5' 1 (1.549 m)   Wt 115 lb 6.4 oz (52.3 kg)  BMI 21.80 kg/m        Wt Readings from Last 3 Encounters:  03/14/24 115 lb 6.4 oz (52.3 kg)  12/05/23 112 lb 6.4 oz (51 kg)  09/05/23 112 lb 9.6 oz (51.1 kg)    GEN: Well nourished, well developed in no acute distress NECK: No JVD; No carotid bruits CARDIAC: RRR, no murmurs, rubs, gallops RESPIRATORY:  Clear to auscultation without rales, wheezing or  rhonchi  ABDOMEN: Soft, non-tender, non-distended EXTREMITIES:  No edema; No deformity   ASSESSMENT AND PLAN  Peripheral arterial disease Leg fatigue without claudication, managed with rest, not activity-limiting.  Severe aortic stenosis status post TAVR Prosthetic aortic valve functioning well, awaiting echocardiogram results. - Review echocardiogram results when available and communicate findings via MyChart. - Currently no symptoms of shortness of breath, chest pain, or presyncope/syncope  Essential hypertension Blood pressure well-controlled with current regimen.  Allergic rhinitis Nasal spray provides relief without adverse effects. - Continue nasal spray as needed for allergy symptoms.      Dispo: She can follow-up in a year with Dr. Wonda  Signed, Orren LOISE Fabry, PA-C

## 2024-03-14 ENCOUNTER — Ambulatory Visit (HOSPITAL_COMMUNITY)
Admission: RE | Admit: 2024-03-14 | Discharge: 2024-03-14 | Disposition: A | Discharge status: Home / Self Care | Source: Ambulatory Visit | Attending: Cardiovascular Disease | Admitting: Cardiovascular Disease

## 2024-03-14 ENCOUNTER — Encounter: Payer: Self-pay | Admitting: Physician Assistant

## 2024-03-14 ENCOUNTER — Ambulatory Visit (INDEPENDENT_AMBULATORY_CARE_PROVIDER_SITE_OTHER): Admitting: Physician Assistant

## 2024-03-14 VITALS — Ht 61.0 in | Wt 115.4 lb

## 2024-03-14 DIAGNOSIS — I10 Essential (primary) hypertension: Secondary | ICD-10-CM | POA: Diagnosis not present

## 2024-03-14 DIAGNOSIS — Z952 Presence of prosthetic heart valve: Secondary | ICD-10-CM | POA: Insufficient documentation

## 2024-03-14 DIAGNOSIS — I739 Peripheral vascular disease, unspecified: Secondary | ICD-10-CM | POA: Insufficient documentation

## 2024-03-14 LAB — ECHOCARDIOGRAM COMPLETE
AV Mean grad: 6 mmHg
AV Peak grad: 10.8 mmHg
Ao pk vel: 1.65 m/s
S' Lateral: 2.53 cm

## 2024-03-14 NOTE — Patient Instructions (Addendum)
 Medication Instructions:  NO CHANGES  Lab Work: NONE TO BE DONE TODAY.  Testing/Procedures: NONE  Follow-Up: At [facility], you and your health needs are our priority.  As part of our continuing mission to provide you with exceptional heart care, our providers are all part of one team.  This team includes your primary Cardiologist (physician) and Advanced Practice Providers or APPs (Physician Assistants and Nurse Practitioners) who all work together to provide you with the care you need, when you need it.  Your next appointment:    1 YEAR  Provider:   Ozell Fell, MD

## 2024-03-16 ENCOUNTER — Ambulatory Visit: Admitting: Physician Assistant

## 2024-03-19 ENCOUNTER — Ambulatory Visit: Payer: Self-pay | Admitting: Cardiovascular Disease

## 2024-03-23 DIAGNOSIS — Z23 Encounter for immunization: Secondary | ICD-10-CM | POA: Diagnosis not present

## 2024-03-28 DIAGNOSIS — F03A0 Unspecified dementia, mild, without behavioral disturbance, psychotic disturbance, mood disturbance, and anxiety: Secondary | ICD-10-CM | POA: Diagnosis not present

## 2024-03-28 DIAGNOSIS — F329 Major depressive disorder, single episode, unspecified: Secondary | ICD-10-CM | POA: Diagnosis not present

## 2024-03-28 DIAGNOSIS — K5909 Other constipation: Secondary | ICD-10-CM | POA: Diagnosis not present

## 2024-03-28 DIAGNOSIS — Z0001 Encounter for general adult medical examination with abnormal findings: Secondary | ICD-10-CM | POA: Diagnosis not present

## 2024-03-28 DIAGNOSIS — I10 Essential (primary) hypertension: Secondary | ICD-10-CM | POA: Diagnosis not present

## 2024-04-02 ENCOUNTER — Ambulatory Visit (HOSPITAL_BASED_OUTPATIENT_CLINIC_OR_DEPARTMENT_OTHER)

## 2024-04-02 ENCOUNTER — Encounter (HOSPITAL_BASED_OUTPATIENT_CLINIC_OR_DEPARTMENT_OTHER): Payer: Self-pay

## 2024-04-09 ENCOUNTER — Ambulatory Visit (INDEPENDENT_AMBULATORY_CARE_PROVIDER_SITE_OTHER): Admitting: Physician Assistant

## 2024-04-09 VITALS — BP 126/77 | HR 75 | Ht 61.0 in | Wt 114.0 lb

## 2024-04-09 DIAGNOSIS — F03A3 Unspecified dementia, mild, with mood disturbance: Secondary | ICD-10-CM | POA: Diagnosis not present

## 2024-04-09 DIAGNOSIS — D329 Benign neoplasm of meninges, unspecified: Secondary | ICD-10-CM | POA: Diagnosis not present

## 2024-04-09 DIAGNOSIS — R413 Other amnesia: Secondary | ICD-10-CM | POA: Insufficient documentation

## 2024-04-09 NOTE — Patient Instructions (Addendum)
 It was a pleasure to see you today at our office.   Recommendations:  Neurocognitive evaluation at our office in March  Follow up in 6 months (after the neurocognitive testing)  Repeat  MRI  prior to the neurocognitive testing      [email or web address]   RECOMMENDATIONS FOR ALL PATIENTS WITH MEMORY PROBLEMS: 1. Continue to exercise (Recommend 30 minutes of walking everyday, or 3 hours every week) 2. Increase social interactions - continue going to [place] and enjoy social gatherings with friends and family 3. Eat healthy, avoid fried foods and eat more fruits and vegetables 4. Maintain adequate blood pressure, blood sugar, and blood cholesterol level. Reducing the risk of stroke and cardiovascular disease also helps promoting better memory. 5. Avoid stressful situations. Live a simple life and avoid aggravations. Organize your time and prepare for the next day in anticipation. 6. Sleep well, avoid any interruptions of sleep and avoid any distractions in the bedroom that may interfere with adequate sleep quality 7. Avoid sugar, avoid sweets as there is a strong link between excessive sugar intake, diabetes, and cognitive impairment We discussed the Mediterranean diet, which has been shown to help patients reduce the risk of progressive memory disorders and reduces cardiovascular risk. This includes eating fish, eat fruits and green leafy vegetables, nuts like almonds and hazelnuts, walnuts, and also use olive oil. Avoid fast foods and fried foods as much as possible. Avoid sweets and sugar as sugar use has been linked to worsening of memory function.  There is always a concern of gradual progression of memory problems. If this is the case, then we may need to adjust level of care according to patient needs. Support, both to the patient and caregiver, should then be put into place.          DRIVING: Regarding driving, in patients with  progressive memory problems, driving will be impaired. We advise to have someone else do the driving if trouble finding directions or if minor accidents are reported. Independent driving assessment is available to determine safety of driving.   If you are interested in the driving assessment, you can contact the following:  [employer] in [place] [phone]  [facility] [phone]  [facility] [phone]  [facility] [phone] or [phone]   FALL PRECAUTIONS: Be cautious when walking. Scan the area for obstacles that may increase the risk of trips and falls. When getting up in the mornings, sit up at the edge of the bed for a few minutes before getting out of bed. Consider elevating the bed at the head end to avoid drop of blood pressure when getting up. Walk always in a well-lit room (use night lights in the walls). Avoid area rugs or power cords from appliances in the middle of the walkways. Use a walker or a cane if necessary and consider physical therapy for balance exercise. Get your eyesight checked regularly.  FINANCIAL OVERSIGHT: Supervision, especially oversight when making financial decisions or transactions is also recommended.  HOME SAFETY: Consider the safety of the kitchen when operating appliances like stoves, microwave oven, and blender. Consider having supervision and share cooking responsibilities until no longer able to participate in those. Accidents with firearms and other hazards in the house should be identified and addressed as well.   ABILITY TO BE LEFT ALONE: If patient is unable to contact 911 operator, consider using LifeLine, or when the need is there, arrange for someone to stay with patients. Smoking is a fire hazard, consider  supervision or cessation. Risk of wandering should be assessed by caregiver and if detected at any point, supervision and safe proof recommendations should be  instituted.  MEDICATION SUPERVISION: Inability to self-administer medication needs to be constantly addressed. Implement a mechanism to ensure safe administration of the medications.      Mediterranean Diet A Mediterranean diet refers to food and lifestyle choices that are based on the traditions of countries located on the [employer] [place]. This way of eating has been shown to help prevent certain conditions and improve outcomes for people who have chronic diseases, like kidney disease and heart disease. What are tips for following this plan? Lifestyle  Cook and eat meals together with your family, when possible. Drink enough fluid to keep your urine clear or pale yellow. Be physically active every day. This includes: Aerobic exercise like running or swimming. Leisure activities like gardening, walking, or housework. Get 7-8 hours of sleep each night. If recommended by your health care provider, drink red wine in moderation. This means 1 glass a day for nonpregnant women and 2 glasses a day for men. A glass of wine equals 5 oz (150 mL). Reading food labels  Check the serving size of packaged foods. For foods such as rice and pasta, the serving size refers to the amount of cooked product, not dry. Check the total fat in packaged foods. Avoid foods that have saturated fat or trans fats. Check the ingredients list for added sugars, such as corn syrup. Shopping  At the grocery store, buy most of your food from the areas near the walls of the store. This includes: Fresh fruits and vegetables (produce). Grains, beans, nuts, and seeds. Some of these may be available in unpackaged forms or large amounts (in bulk). Fresh seafood. Poultry and eggs. Low-fat dairy products. Buy whole ingredients instead of prepackaged foods. Buy fresh fruits and vegetables in-season from local farmers markets. Buy frozen fruits and vegetables in resealable bags. If you do not have access to quality fresh  seafood, buy precooked frozen shrimp or canned fish, such as tuna, salmon, or sardines. Buy small amounts of raw or cooked vegetables, salads, or olives from the deli or salad bar at your store. Stock your pantry so you always have certain foods on hand, such as olive oil, canned tuna, canned tomatoes, rice, pasta, and beans. Cooking  Cook foods with extra-virgin olive oil instead of using butter or other vegetable oils. Have meat as a side dish, and have vegetables or grains as your main dish. This means having meat in small portions or adding small amounts of meat to foods like pasta or stew. Use beans or vegetables instead of meat in common dishes like chili or lasagna. Experiment with different cooking methods. Try roasting or broiling vegetables instead of steaming or sauteing them. Add frozen vegetables to soups, stews, pasta, or rice. Add nuts or seeds for added healthy fat at each meal. You can add these to yogurt, salads, or vegetable dishes. Marinate fish or vegetables using olive oil, lemon juice, garlic, and fresh herbs. Meal planning  Plan to eat 1 vegetarian meal one day each week. Try to work up to 2 vegetarian meals, if possible. Eat seafood 2 or more times a week. Have healthy snacks readily available, such as: Vegetable sticks with hummus. Greek yogurt. Fruit and nut trail mix. Eat balanced meals throughout the week. This includes: Fruit: 2-3 servings a day Vegetables: 4-5 servings a day Low-fat dairy: 2 servings a day Fish, poultry, or  lean meat: 1 serving a day Beans and legumes: 2 or more servings a week Nuts and seeds: 1-2 servings a day Whole grains: 6-8 servings a day Extra-virgin olive oil: 3-4 servings a day Limit red meat and sweets to only a few servings a month What are my food choices? Mediterranean diet Recommended Grains: Whole-grain pasta. Brown rice. Bulgar wheat. Polenta. Couscous. Whole-wheat bread. [place] Madeira. Vegetables: Artichokes. Beets.  Broccoli. Cabbage. Carrots. Eggplant. Green beans. Chard. Kale. Spinach. Onions. Leeks. Peas. Squash. Tomatoes. Peppers. Radishes. Fruits: Apples. Apricots. Avocado. Berries. Bananas. Cherries. Dates. Figs. Grapes. Lemons. Melon. Oranges. Peaches. Plums. Pomegranate. Meats and other protein foods: Beans. Almonds. Sunflower seeds. Pine nuts. Peanuts. Cod. Salmon. Scallops. Shrimp. Tuna. Tilapia. Clams. Oysters. Eggs. Dairy: Low-fat milk. Cheese. Greek yogurt. Beverages: Water . Red wine. Herbal tea. Fats and oils: Extra virgin olive oil. Avocado oil. Grape seed oil. Sweets and desserts: [place] yogurt with honey. Baked apples. Poached pears. Trail mix. Seasoning and other foods: Basil. Cilantro. Coriander. Cumin. Mint. Parsley. Sage. Rosemary. Tarragon. Garlic. Oregano. Thyme. Pepper. Balsalmic vinegar. Tahini. Hummus. Tomato sauce. Olives. Mushrooms. Limit these Grains: Prepackaged pasta or rice dishes. Prepackaged cereal with added sugar. Vegetables: Deep fried potatoes (french fries). Fruits: Fruit canned in syrup. Meats and other protein foods: Beef. Pork. Lamb. Poultry with skin. Hot dogs. [place]. Dairy: Ice cream. Sour cream. Whole milk. Beverages: Juice. Sugar-sweetened soft drinks. Beer. Liquor and spirits. Fats and oils: Butter. Canola oil. Vegetable oil. Beef fat (tallow). Lard. Sweets and desserts: Cookies. Cakes. Pies. Candy. Seasoning and other foods: Mayonnaise. Premade sauces and marinades. The items listed may not be a complete list. Talk with your dietitian about what dietary choices are right for you. Summary The Mediterranean diet includes both food and lifestyle choices. Eat a variety of fresh fruits and vegetables, beans, nuts, seeds, and whole grains. Limit the amount of red meat and sweets that you eat. Talk with your health care provider about whether it is safe for you to drink red wine in moderation. This means 1 glass a day for nonpregnant women and 2 glasses a day for  men. A glass of wine equals 5 oz (150 mL). This information is not intended to replace advice given to you by your health care provider. Make sure you discuss any questions you have with your health care provider. Document Released: 01/29/2016 Document Revised: 03/02/2016 Document Reviewed: 01/29/2016 Elsevier Interactive Patient Education  2017 [employer].

## 2024-04-09 NOTE — Progress Notes (Signed)
 Assessment/Plan:   Mild dementia of unclear etiology mood disturbance   Michaela Simpson is a very pleasant 89 y.o. RH female with a history of hypertension, hyperlipidemia, PAD, insomnia, recent closed lateral, minimally displaced, C2 fracture (03/2023), severe Ao Stenosis st TAVR , borderline QT prolongation, history of thoracic aortic aneurysm, anxiety, depression, B12 deficiency seen today in follow up for memory loss. Patient is not on antidementia medication per prior discussion with daughter about potential side effects with the medicine.   Memory is stable with MMSE today at 27/30. Patient is able to participate on ADLs , no longer drives. Mood is good, has adapted very well at the facility.     Follow up in  6 months. Repeat neuropsych evaluation in 6 to 12 months for diagnostic clarity and disease trajectory Repeat CT of the head for meningioma (daughter prefers CT to MRI) Recommend good control of her cardiovascular risk factors Continue to control mood as per PCP     Subjective:    This patient is accompanied in the office by daughter who supplements the history.  Previous records as well as any outside records available were reviewed prior to todays visit. Patient was last seen on 09/05/2023   Any changes in memory since last visit?  It is better-daughter says. When she is stressed she may struggle more.  Patient denies any issues.  Daughter reports she has some difficulties with short-term memory including conversations and names but the long-term memory is fair.  She lives at ALF, enjoys the place very much. Likes doing word search and other brain stimulating exercises. repeats oneself?  Endorsed Disoriented when walking into a room? Denies    Leaving objects?  May misplace things but not in unusual places   Wandering behavior?  denies   Any personality changes since last visit?  Denies.I am doing better, less stressed.   Any worsening depression?:  Denies. She  has readjusted to a new life, especially after selling her house. Hallucinations or paranoia?  Denies.   Seizures? denies    Any sleep changes?  Sleeps well unless something is on my mind. Denies vivid dreams, REM behavior or sleepwalking   Sleep apnea?   Denies.   Any hygiene concerns? Denies.  Independent of bathing and dressing?  Endorsed  Does the patient needs help with medications?   ALF staff in charge Who is in charge of the finances?  Daughter is in charge     Any changes in appetite?  Too god.  I am more comfortable so I eat better.   Patient have trouble swallowing? Denies.   Does the patient cook? No Any headaches?   Denies.   Any vision changes? Denies  Chronic back pain  denies.   Ambulates with difficulty?  She uses a walker to prevent falls.  Recent falls or head injuries? Denies since her last injury in October 2024 with subsequent C2 close fracture requiring hard collar, removed since January 2025.     Unilateral weakness, numbness or tingling? Denies.   Any tremors?  Denies    Any anosmia?  Denies   Any incontinence of urine?  Endorsed, urge incontinence with recurrent UTIs, wears diapers   Any bowel dysfunction?   Denies      Patient lives in ALF [facility], 24/7 care     Does the patient drive? No longer drives    Initial visit December 2024 How long did patient have memory difficulties? Unclear when symptoms became present.  Patient has difficulty remembering new information, recent conversations and names of people. When under stress, she gets confused when agitated and tired-daughter says. repeats oneself?  Endorsed Disoriented when walking into a room?  Denies except occasionally not remembering what patient came to the room for    Leaving objects in unusual places?   Denies.  Wandering behavior? Denies.   Any personality changes, or depression, anxiety?  Has moments of irritability.  Hallucinations or paranoia?  Denies.   Seizures? Denies.     Any sleep changes? Does not sleep well , had some nightmares after the falls, it may have been related to UTIs . Denies  REM behavior or sleepwalking.   Sleep apnea? Denies.   Any hygiene concerns?  Denies.   Independent of bathing and dressing?Does not  need assistance getting dressed.  Who is in charge of the medications?  Facility is in charge   Who is in charge of the finances? Daughter  is in charge  for now because she is in ALF    Any changes in appetite?   Denies.     Patient have trouble swallowing?  Denies.   Does the patient cook?  No   Any headaches?  Denies.   Chronic back pain?  Denies.   Ambulates with difficulty? Needs a cane due to history of falls  Recent falls or head injuries? Endorsed, after mechanical fall 04/06/23 with subsequent C2 closed fracture, requiring hard collar.  Vision changes?She has a history of cataracts.  Stroke like symptoms?  Denies.   Any tremors?  Denies.   Any anosmia?  Denies.   Any incontinence of urine? Urge incontinence.Recent UTI.  Any bowel dysfunction? Denies.      Patient lives temporarily at [facility]. Until then she was living alone. Daughter is looking for a different place upon discharge from ALF   History of heavy alcohol intake? Denies.   History of heavy tobacco use? Denies.   Family history of dementia?  Yes, 3 sisters with dementia ?type Does patient drive?  She was driving short distances until the fall.      Neuropsych evaluation 07/18/2023 briefly, results suggested isolated impairments surrounding cognitive flexibility, semantic fluency, and clock drawing. Further weakness was exhibited across encoding (i.e., learning) and delayed retrieval aspects of memory. Variability was exhibited across processing speed, confrontation naming, and non-clock drawing visuospatial abilities. The etiology for her mild dementia presentation remains unclear. With that being said, I do have some concern for  an underlying neurodegenerative illness, namely Alzheimer's disease. Across memory testing, [name] had notable difficulty recalling information she had previously learned after a fairly brief delay. This raises concerns for rapid forgetting, which is one of the hallmark testing patterns for this illness. Further impairment/weakness surrounding semantic fluency, confrontation naming, clock drawing, and cognitive flexibility would follow typical disease progression. With that being said, she did show benefit from cueing across all memory tasks. This is certainly encouraging and would not suggest a prominent storage impairment at the current time. Overall, should Alzheimer's disease truly be present, it would appear to be in early stages. Continued monitoring will be very important moving forward.     PREVIOUS MEDICATIONS:   CURRENT MEDICATIONS:  Outpatient Encounter Medications as of 04/09/2024  Medication Sig   acetaminophen  (TYLENOL ) 325 MG tablet TAKE (2)TABLETS BY MOUTH EVERY 6 HOURS AS NEEDED FOR PAIN OR FEVER GREATER THAN 101   amLODipine  (NORVASC ) 5 MG tablet TAKE (1) TABLET BY MOUTH  DAILY.   azelastine  (ASTELIN ) 0.1 % nasal spray Place 1 spray into both nostrils 2 (two) times daily. Use in each nostril as directed   clopidogrel  (PLAVIX ) 75 MG tablet TAKE (1) TABLET BY MOUTH DAILY.   cyanocobalamin  (VITAMIN B12) 500 MCG tablet Take 1 tablet by mouth once daily   DM-APAP-CPM (CORICIDIN HBP) 10-325-2 MG TABS Take by mouth.   fluticasone  (FLONASE ) 50 MCG/ACT nasal spray Place into both nostrils daily.   Glucosamine-Chondroitin 500-400 MG CAPS TAKE (1) CAPSULE BY MOUTH DAILY.   magnesium  oxide (MAG-OX) 400 (240 Mg) MG tablet TAKE (1) TABLET BY MOUTH DAILY.   Probiotic Product (RISA-BID PROBIOTIC) TABS TAKE (1) TABLET BY MOUTH DAILY.   sertraline  (ZOLOFT ) 100 MG tablet TAKE (1_1/2) TABLETS BY MOUTH DAILY.   Lactobacillus (PROBIOTIC ACIDOPHILUS PO) Take 1 tablet by mouth daily. (Patient not  taking: Reported on 04/09/2024)   No facility-administered encounter medications on file as of 04/09/2024.       04/09/2024    2:00 PM  MMSE - Mini Mental State Exam  Orientation to time 3  Orientation to Place 5  Registration 3  Attention/ Calculation 5  Recall 3  Language- name 2 objects 2  Language- repeat 1  Language- follow 3 step command 3  Language- read & follow direction 1  Write a sentence 1  Copy design 0  Total score 27      05/30/2023    9:00 AM  Montreal Cognitive Assessment   Visuospatial/ Executive (0/5) 1  Naming (0/3) 3  Attention: Read list of digits (0/2) 2  Attention: Read list of letters (0/1) 1  Attention: Serial 7 subtraction starting at 100 (0/3) 0  Language: Repeat phrase (0/2) 1  Language : Fluency (0/1) 1  Abstraction (0/2) 1  Delayed Recall (0/5) 1  Orientation (0/6) 5  Total 16  Adjusted Score (based on education) 17    Objective:     PHYSICAL EXAMINATION:    VITALS:   Vitals:   04/09/24 1352  BP: 126/77  Pulse: 75  SpO2: 98%  Weight: 114 lb (51.7 kg)  Height: 5' 1 (1.549 m)    GEN:  The patient appears stated age and is in NAD. HEENT:  Normocephalic, atraumatic.   Neurological examination:  General: NAD, well-groomed, appears stated age. Orientation: The patient is alert. Oriented to person, place and not to date Cranial nerves: There is good facial symmetry.The speech is fluent and clear. No aphasia or dysarthria. Fund of knowledge is appropriate. Recent memory impaired, remote memory fair. Attention and concentration are normal. Able to name objects and repeat phrases.  Hearing is  mildly decreased to conversational tone.   Sensation: Sensation is intact to light touch throughout Motor: Strength is at least antigravity x4. DTR's 2/4 in UE/LE     Movement examination: Tone: There is normal tone in the UE/LE Abnormal movements:  no tremor.  No myoclonus.  No asterixis.   Coordination:  There is no decremation with  RAM's. Normal finger to nose  Gait and Station: The patient has some  difficulty arising out of a deep-seated chair without the use of the hands.  Needs a walker to ambulate.  The patient's stride length is slow.  Gait is cautious and narrow.    Thank you for allowing us  the opportunity to participate in the care of this nice patient. Please do not hesitate to contact us  for any questions or concerns.   Total time spent on today's visit was 30 minutes dedicated  to this patient today, preparing to see patient, examining the patient, ordering tests and/or medications and counseling the patient, documenting clinical information in the EHR or other health record, independently interpreting results and communicating results to the patient/family, discussing treatment and goals, answering patient's questions and coordinating care.  Cc:  de [place], Raymond J, MD  Camie Sevin 04/09/2024 7:11 PM

## 2024-04-14 ENCOUNTER — Emergency Department (HOSPITAL_BASED_OUTPATIENT_CLINIC_OR_DEPARTMENT_OTHER)
Admission: EM | Admit: 2024-04-14 | Discharge: 2024-04-14 | Disposition: A | Discharge status: Home / Self Care | Attending: Emergency Medicine | Admitting: Emergency Medicine

## 2024-04-14 ENCOUNTER — Emergency Department (HOSPITAL_BASED_OUTPATIENT_CLINIC_OR_DEPARTMENT_OTHER): Admitting: Radiology

## 2024-04-14 ENCOUNTER — Other Ambulatory Visit: Payer: Self-pay

## 2024-04-14 ENCOUNTER — Encounter (HOSPITAL_BASED_OUTPATIENT_CLINIC_OR_DEPARTMENT_OTHER): Payer: Self-pay

## 2024-04-14 DIAGNOSIS — I700 Atherosclerosis of aorta: Secondary | ICD-10-CM | POA: Diagnosis not present

## 2024-04-14 DIAGNOSIS — S39012A Strain of muscle, fascia and tendon of lower back, initial encounter: Secondary | ICD-10-CM | POA: Diagnosis not present

## 2024-04-14 DIAGNOSIS — Y92019 Unspecified place in single-family (private) house as the place of occurrence of the external cause: Secondary | ICD-10-CM | POA: Insufficient documentation

## 2024-04-14 DIAGNOSIS — S32008A Other fracture of unspecified lumbar vertebra, initial encounter for closed fracture: Secondary | ICD-10-CM | POA: Insufficient documentation

## 2024-04-14 DIAGNOSIS — I10 Essential (primary) hypertension: Secondary | ICD-10-CM | POA: Diagnosis not present

## 2024-04-14 DIAGNOSIS — S32000A Wedge compression fracture of unspecified lumbar vertebra, initial encounter for closed fracture: Secondary | ICD-10-CM

## 2024-04-14 DIAGNOSIS — R030 Elevated blood-pressure reading, without diagnosis of hypertension: Secondary | ICD-10-CM

## 2024-04-14 DIAGNOSIS — W010XXA Fall on same level from slipping, tripping and stumbling without subsequent striking against object, initial encounter: Secondary | ICD-10-CM | POA: Diagnosis not present

## 2024-04-14 DIAGNOSIS — M79602 Pain in left arm: Secondary | ICD-10-CM | POA: Diagnosis not present

## 2024-04-14 DIAGNOSIS — M545 Low back pain, unspecified: Secondary | ICD-10-CM | POA: Diagnosis not present

## 2024-04-14 MED ORDER — ACETAMINOPHEN 325 MG PO TABS
650.0000 mg | ORAL_TABLET | Freq: Once | ORAL | Status: DC
  Filled 2024-04-14: qty 2

## 2024-04-14 MED ORDER — TRAMADOL HCL 50 MG PO TABS: 50.0000 mg | ORAL_TABLET | Freq: Three times a day (TID) | ORAL | 0 refills | Status: AC | PRN

## 2024-04-14 NOTE — ED Notes (Signed)
 Patient transported to X-ray

## 2024-04-14 NOTE — ED Provider Notes (Signed)
 [place] EMERGENCY DEPARTMENT AT [facility] Provider Note   CSN: [id number] Arrival date & time: 04/14/24  1128     Patient presents with: Fall, Back Pain, and Arm Pain (left)   Michaela Simpson is a 89 y.o. female.   Pt presents s/p fall at home/facility a few days ago. Indicates was in her room, was trying to turn off light when lost balance and fell. Normally walks w walker. Has been ambulatory since, but c/o contusion to left upper arm and lower back pain post fall. Moderate, dull, non radiating. No leg pain. No saddle area or leg numbness. No weakness. Denies loc. No headache since fall. No neck or upper back pain. Denies other extremity pain or injury. Skin intact. No anticoagulant use other than plavix .   The history is provided by the patient, a relative and medical records.  Fall Pertinent negatives include no chest pain, no abdominal pain, no headaches and no shortness of breath.  Back Pain Associated symptoms: no abdominal pain, no chest pain, no fever, no headaches, no numbness and no weakness   Arm Pain Pertinent negatives include no chest pain, no abdominal pain, no headaches and no shortness of breath.       Prior to Admission medications   Medication Sig Start Date End Date Taking? Authorizing Provider  traMADol  (ULTRAM ) 50 MG tablet Take 1 tablet (50 mg total) by mouth every 8 (eight) hours as needed. 04/14/24  Yes Bernard Drivers, MD  acetaminophen  (TYLENOL ) 325 MG tablet TAKE (2)TABLETS BY MOUTH EVERY 6 HOURS AS NEEDED FOR PAIN OR FEVER GREATER THAN 101 11/17/23   [place], Quintin PARAS, MD  amLODipine  (NORVASC ) 5 MG tablet TAKE (1) TABLET BY MOUTH DAILY. 09/19/23   [place], Quintin PARAS, MD  azelastine  (ASTELIN ) 0.1 % nasal spray Place 1 spray into both nostrils 2 (two) times daily. Use in each nostril as directed 01/28/24   Stuart Vernell Norris, PA-C  clopidogrel  (PLAVIX ) 75 MG tablet TAKE (1) TABLET BY MOUTH DAILY. 08/19/23   de [place], Raymond J, MD   cyanocobalamin  (VITAMIN B12) 500 MCG tablet Take 1 tablet by mouth once daily 08/19/23   [place], Quintin PARAS, MD  DM-APAP-CPM (CORICIDIN HBP) 10-325-2 MG TABS Take by mouth.    [provider]  fluticasone  (FLONASE ) 50 MCG/ACT nasal spray Place into both nostrils daily.    [provider]  Glucosamine-Chondroitin 500-400 MG CAPS TAKE (1) CAPSULE BY MOUTH DAILY. 06/21/23   de [place], Raymond J, MD  Lactobacillus (PROBIOTIC ACIDOPHILUS PO) Take 1 tablet by mouth daily. Patient not taking: Reported on 04/09/2024    [provider]  magnesium  oxide (MAG-OX) 400 (240 Mg) MG tablet TAKE (1) TABLET BY MOUTH DAILY. 09/19/23   de [place], Quintin PARAS, MD  Probiotic Product (RISA-BID PROBIOTIC) TABS TAKE (1) TABLET BY MOUTH DAILY. 06/21/23   [place], Quintin PARAS, MD  sertraline  (ZOLOFT ) 100 MG tablet TAKE (1_1/2) TABLETS BY MOUTH DAILY. 06/21/23   [place], Raymond J, MD    Allergies: Penicillins and Codeine    Review of Systems  Constitutional:  Negative for fever.  Eyes:  Negative for visual disturbance.  Respiratory:  Negative for shortness of breath.   Cardiovascular:  Negative for chest pain.  Gastrointestinal:  Negative for abdominal pain, nausea and vomiting.  Genitourinary:  Negative for flank pain.  Musculoskeletal:  Positive for back pain. Negative for neck pain.  Skin:  Negative for wound.  Neurological:  Negative for speech difficulty, weakness, numbness  and headaches.    Updated Vital Signs BP (!) 178/76   Pulse 64   Temp 97.6 F (36.4 C) (Oral)   Resp 20   Ht 1.549 m (5' 1)   Wt 51.7 kg   SpO2 100%   BMI 21.54 kg/m   Physical Exam Vitals and nursing note reviewed.  Constitutional:      Appearance: Normal appearance. She is well-developed.  HENT:     Head: Atraumatic.     Nose: Nose normal.     Mouth/Throat:     Mouth: Mucous membranes are moist.  Eyes:     General: No scleral icterus.    Conjunctiva/sclera: Conjunctivae normal.     Pupils: Pupils  are equal, round, and reactive to light.  Neck:     Trachea: No tracheal deviation.  Cardiovascular:     Rate and Rhythm: Normal rate and regular rhythm.     Pulses: Normal pulses.     Heart sounds: Normal heart sounds. No murmur heard.    No friction rub. No gallop.  Pulmonary:     Effort: Pulmonary effort is normal. No respiratory distress.     Breath sounds: Normal breath sounds.  Chest:     Chest wall: No tenderness.  Abdominal:     General: Bowel sounds are normal. There is no distension.     Palpations: Abdomen is soft.     Tenderness: There is no abdominal tenderness.  Genitourinary:    Comments: No cva tenderness.  Musculoskeletal:        General: No swelling.     Cervical back: Normal range of motion and neck supple. No rigidity or tenderness. No muscular tenderness.     Comments: Mid to upper lumbar tenderness, otherwise CTLS spine, non tender, aligned, no step off. Bruising and mild tenderness to left upper arm, compartments of arm soft, no severe swelling. Otherwise good rom bil extremities without pain or other focal bony tenderness. Distal pulses palp bil.   Skin:    General: Skin is warm and dry.     Findings: No rash.  Neurological:     Mental Status: She is alert.     Comments: Alert, speech normal. GCS 15. Motor/sens grossly intact bil. Steady gait.   Psychiatric:        Mood and Affect: Mood normal.     (all labs ordered are listed, but only abnormal results are displayed) Labs Reviewed - No data to display  EKG: None  Radiology: DG Humerus Left Result Date: 04/14/2024 EXAM: 1 VIEW(S) XRAY OF THE LEFT HUMERUS 04/14/2024 12:26:00 PM COMPARISON: None available. CLINICAL HISTORY: fall, pain. Table formatting from the original note was not included.; Per triage: ; Arrives POV to the ED with complaints of having low back pain and left arm pain. Patient states that she had a mechanical fall while in her room (lives in a facility-[facility]). Patient  did not have LOC. ; Pt's daughter stated she had a fall earlier this year and injured her neck, no surgery. Pt stated pain is mostly in her lower back. Left upper arm is almost entirely bruised. FINDINGS: BONES AND JOINTS: Remote healed humeral neck fracture with osseous bridging. No focal osseous lesion. SOFT TISSUES: The soft tissues are unremarkable. IMPRESSION: 1. No acute findings. 2. Remote humeral neck fracture with osseous bridging. Electronically signed by: Waddell Calk MD 04/14/2024 12:44 PM EDT RP Workstation: [id number]   DG Lumbar Spine Complete Result Date: 04/14/2024 EXAM: 4 VIEW(S) XRAY OF THE LUMBAR SPINE  04/14/2024 12:26:00 PM COMPARISON: 07/26/2019 CLINICAL HISTORY: fall, pain. Table formatting from the original note was not included.; Per triage: ; Arrives POV to the ED with complaints of having low back pain and left arm pain. Patient states that she had a mechanical fall while in her room (lives in a facility-[facility]). Patient did not have LOC. ; Table formatting from the original note was not included.; Per triage: ; Arrives POV to the ED with complaints of having low back pain and left arm pain. Patient states that she had a mechanical fall while in her room (lives in a facility-[facility]). Patient did not have LOC. ; Pt's daughter stated she had a fall earlier this year and injured her neck, no surgery. Pt stated pain is mostly in her lower back. Left upper arm is almost entirely bruised. FINDINGS: LUMBAR SPINE: BONES: Mild curvature of the lumbar spine is convex towards the right. There are chronic compression deformities involving T12, L1, L2 and L4. These appear similar when compared with the previous exam, no new compression fractures identified. No signs of acute fracture or subluxation. No aggressive appearing osseous lesion. Healed fracture deformities noted involving the left superior and inferior left pubic rami. Signs of previous left hip arthroplasty.  Vascular calcifications involving the abdominal aorta. DISCS AND DEGENERATIVE CHANGES: Mild multilevel endplate degenerative changes. L5-S1 facet arthropathy. SOFT TISSUES: No acute abnormality. IMPRESSION: 1. No acute lumbar spine abnormality related to the reported fall. 2. Chronic compression deformities at T12, L1, L2, and L4, described as chronic and unchanged from 07/26/2019, with no new compression fractures identified. 3. Electronically signed by: Waddell Calk MD 04/14/2024 12:43 PM EDT RP Workstation: [id number]     Procedures   Medications Ordered in the ED  acetaminophen  (TYLENOL ) tablet 650 mg (has no administration in time range)                                    Medical Decision Making Problems Addressed: Closed compression fracture of body of lumbar vertebra [facility]): acute illness or injury Elevated blood pressure reading: acute illness or injury Essential hypertension: chronic illness or injury with exacerbation, progression, or side effects of treatment that poses a threat to life or bodily functions Fall from slip, trip, or stumble, initial encounter: acute illness or injury with systemic symptoms that poses a threat to life or bodily functions Lumbosacral strain, initial encounter: acute illness or injury with systemic symptoms  Amount and/or Complexity of Data Reviewed Independent Historian:     Details: Fam/friend, hx External Data Reviewed: notes. Radiology: ordered and independent interpretation performed. Decision-making details documented in ED Course.  Risk OTC drugs. Prescription drug management.   Imaging ordered.   Reviewed nursing notes and prior charts for additional history.   Xrays reviewed/interpreted by me - compression fxs, ?old.   Acetaminophen  po. Rx for home.   Discussed imaging w pt.   Pt comfortable appearing and currently appears stable for ed d/c.   Rec pcp f/u.  Return precautions provided.        Final diagnoses:  Fall  from slip, trip, or stumble, initial encounter  Lumbosacral strain, initial encounter  Closed compression fracture of body of lumbar vertebra (HCC)  Elevated blood pressure reading  Essential hypertension    ED Discharge Orders          Ordered    traMADol  (ULTRAM ) 50 MG tablet  Every 8 hours  PRN        04/14/24 1339               Bernard Drivers, MD 04/14/24 1341

## 2024-04-14 NOTE — Discharge Instructions (Addendum)
 It was our pleasure to provide your ER care today - we hope that you feel better.  Your imaging studies were read as showing no acute fractures, but note was made of 'Chronic compression deformities at T12, L1, L2, and L4'.    Fall precautions. Use walker and/or assistance when up and about.   Take acetaminophen  as need for pain. You may also take ultram  as need for pain.   Follow up with primary care doctor in 1-2 weeks.   Return to ER if worse, new symptoms, severe headache, new or intractable back pain, numbness/weakness, or other concern.

## 2024-04-14 NOTE — ED Triage Notes (Signed)
 Arrives POV to the ED with complaints of having low back pain and left arm pain. Patient states that she had a mechanical fall while in her room (lives in a facility-[facility]). Patient did not have LOC.

## 2024-04-19 DIAGNOSIS — F329 Major depressive disorder, single episode, unspecified: Secondary | ICD-10-CM | POA: Diagnosis not present

## 2024-04-19 DIAGNOSIS — I10 Essential (primary) hypertension: Secondary | ICD-10-CM | POA: Diagnosis not present

## 2024-04-19 DIAGNOSIS — I739 Peripheral vascular disease, unspecified: Secondary | ICD-10-CM | POA: Diagnosis not present

## 2024-04-19 DIAGNOSIS — M069 Rheumatoid arthritis, unspecified: Secondary | ICD-10-CM | POA: Diagnosis not present

## 2024-04-19 DIAGNOSIS — I700 Atherosclerosis of aorta: Secondary | ICD-10-CM | POA: Diagnosis not present

## 2024-04-20 DIAGNOSIS — Z9181 History of falling: Secondary | ICD-10-CM | POA: Diagnosis not present

## 2024-04-20 DIAGNOSIS — K5909 Other constipation: Secondary | ICD-10-CM | POA: Diagnosis not present

## 2024-04-20 DIAGNOSIS — S32020D Wedge compression fracture of second lumbar vertebra, subsequent encounter for fracture with routine healing: Secondary | ICD-10-CM | POA: Diagnosis not present

## 2024-04-20 DIAGNOSIS — S32010D Wedge compression fracture of first lumbar vertebra, subsequent encounter for fracture with routine healing: Secondary | ICD-10-CM | POA: Diagnosis not present

## 2024-04-20 DIAGNOSIS — S32040D Wedge compression fracture of fourth lumbar vertebra, subsequent encounter for fracture with routine healing: Secondary | ICD-10-CM | POA: Diagnosis not present

## 2024-04-20 DIAGNOSIS — F329 Major depressive disorder, single episode, unspecified: Secondary | ICD-10-CM | POA: Diagnosis not present

## 2024-04-20 DIAGNOSIS — Z23 Encounter for immunization: Secondary | ICD-10-CM | POA: Diagnosis not present

## 2024-04-20 DIAGNOSIS — M12552 Traumatic arthropathy, left hip: Secondary | ICD-10-CM | POA: Diagnosis not present

## 2024-04-23 ENCOUNTER — Encounter: Payer: Self-pay | Admitting: Radiology

## 2024-04-25 ENCOUNTER — Telehealth: Payer: Self-pay | Admitting: Orthopedic Surgery

## 2024-04-25 ENCOUNTER — Ambulatory Visit (INDEPENDENT_AMBULATORY_CARE_PROVIDER_SITE_OTHER): Admitting: Orthopedic Surgery

## 2024-04-25 ENCOUNTER — Encounter: Payer: Self-pay | Admitting: Orthopedic Surgery

## 2024-04-25 DIAGNOSIS — S22080G Wedge compression fracture of T11-T12 vertebra, subsequent encounter for fracture with delayed healing: Secondary | ICD-10-CM | POA: Diagnosis not present

## 2024-04-25 DIAGNOSIS — M545 Low back pain, unspecified: Secondary | ICD-10-CM | POA: Diagnosis not present

## 2024-04-25 DIAGNOSIS — Z9181 History of falling: Secondary | ICD-10-CM | POA: Diagnosis not present

## 2024-04-25 DIAGNOSIS — M25552 Pain in left hip: Secondary | ICD-10-CM | POA: Diagnosis not present

## 2024-04-25 NOTE — Telephone Encounter (Signed)
 I called and lmom for Michaela Simpson that we can see her at 8 today

## 2024-04-25 NOTE — Progress Notes (Signed)
 Orthopedic Spine Surgery Office Note  Assessment: Patient is a 89 y.o. female with acute onset of low back pain. No radicular symptoms.   Plan: -This may represent an acute on chronic injury to the L4 vertebra.  She hurts at about the level of L4.  Recommended a CT scan to evaluate for compression fracture -She should use the Norco at night since she does have more pain and has difficulty sleeping.  During the day, she can use tramadol  so she is steady on her feet and that seems to control her pain during the day -Recommended lidocaine  patch and heat -Expect that her pain will get better with conservative treatment -Patient should return to office in 3 weeks, x-rays at next visit: none   Patient expressed understanding of the plan and all questions were answered to the patient's satisfaction.   ___________________________________________________________________________   History:  Patient is a 89 y.o. female who presents today for lumbar spine.  Patient has had recent onset of low back pain.  No pain radiating into either lower extremity.  She had a fall that preceded the onset of the pain.  She has a history of compression fractures.  She has been treated with tramadol  and Norco.  Pain is well-controlled with the Norco.  She does have difficulty sleeping at night.  One of the issues with the Norco is that it causes her to be a little drowsy and unsteady.   Treatments tried: Tramadol , Norco   Physical Exam:  General: no acute distress, appears stated age Neurologic: alert, answering questions appropriately, following commands Respiratory: unlabored breathing on room air, symmetric chest rise Psychiatric: appropriate affect, normal cadence to speech   MSK (spine):  -Strength exam      Left  Right EHL    5/5  5/5 TA    5/5  5/5 GSC    5/5  5/5 Knee extension  5/5  5/5 Hip flexion   5/5  5/5  -Sensory exam    Sensation intact to light touch in L3-S1 nerve distributions of  bilateral lower extremities  -Achilles DTR: 1/4 on the left, 1/4 on the right -Patellar tendon DTR: 1/4 on the left, 1/4 on the right  -Straight leg raise: Negative bilaterally -Clonus: no beats bilaterally  -Left hip exam: No pain through range of motion -Right hip exam: No pain through range of motion  Imaging: XRs of the lumbar spine from 04/14/2024 were independently reviewed and interpreted, showing L4 compression fracture.  Compression fractures also seen at L2, L1, T12.  No spondylolisthesis seen.  No dislocation seen.   Patient name: Michaela Simpson Patient MRN: [id number] Date of visit: 04/25/24

## 2024-04-25 NOTE — Telephone Encounter (Signed)
 Patient daughter called and said that she is in so much pain at the facility that she isn't sleeping at all and doing a lot crying. She thinks that its more going on. The Tramadol  she is currently taking isn't working. CB#[phone] Dawn her daughter

## 2024-04-25 NOTE — Telephone Encounter (Signed)
 See my chart message

## 2024-04-26 ENCOUNTER — Ambulatory Visit: Admitting: Orthopedic Surgery

## 2024-04-30 ENCOUNTER — Ambulatory Visit: Admitting: Orthopedic Surgery

## 2024-04-30 ENCOUNTER — Other Ambulatory Visit

## 2024-04-30 ENCOUNTER — Ambulatory Visit (HOSPITAL_COMMUNITY)
Admission: RE | Admit: 2024-04-30 | Discharge: 2024-04-30 | Disposition: A | Discharge status: Home / Self Care | Source: Ambulatory Visit | Attending: Orthopedic Surgery | Admitting: Orthopedic Surgery

## 2024-04-30 DIAGNOSIS — M48061 Spinal stenosis, lumbar region without neurogenic claudication: Secondary | ICD-10-CM | POA: Diagnosis not present

## 2024-04-30 DIAGNOSIS — M545 Low back pain, unspecified: Secondary | ICD-10-CM | POA: Insufficient documentation

## 2024-04-30 DIAGNOSIS — M47816 Spondylosis without myelopathy or radiculopathy, lumbar region: Secondary | ICD-10-CM | POA: Diagnosis not present

## 2024-04-30 DIAGNOSIS — R29890 Loss of height: Secondary | ICD-10-CM | POA: Diagnosis not present

## 2024-04-30 DIAGNOSIS — M4856XA Collapsed vertebra, not elsewhere classified, lumbar region, initial encounter for fracture: Secondary | ICD-10-CM | POA: Diagnosis not present

## 2024-05-04 DIAGNOSIS — G8929 Other chronic pain: Secondary | ICD-10-CM | POA: Diagnosis not present

## 2024-05-04 DIAGNOSIS — I700 Atherosclerosis of aorta: Secondary | ICD-10-CM | POA: Diagnosis not present

## 2024-05-04 DIAGNOSIS — I10 Essential (primary) hypertension: Secondary | ICD-10-CM | POA: Diagnosis not present

## 2024-05-04 DIAGNOSIS — I739 Peripheral vascular disease, unspecified: Secondary | ICD-10-CM | POA: Diagnosis not present

## 2024-05-04 DIAGNOSIS — F329 Major depressive disorder, single episode, unspecified: Secondary | ICD-10-CM | POA: Diagnosis not present

## 2024-05-04 DIAGNOSIS — M069 Rheumatoid arthritis, unspecified: Secondary | ICD-10-CM | POA: Diagnosis not present

## 2024-05-23 DIAGNOSIS — G4700 Insomnia, unspecified: Secondary | ICD-10-CM | POA: Diagnosis not present

## 2024-05-23 DIAGNOSIS — F03A3 Unspecified dementia, mild, with mood disturbance: Secondary | ICD-10-CM | POA: Diagnosis not present

## 2024-05-23 DIAGNOSIS — I10 Essential (primary) hypertension: Secondary | ICD-10-CM | POA: Diagnosis not present

## 2024-05-23 DIAGNOSIS — R296 Repeated falls: Secondary | ICD-10-CM | POA: Diagnosis not present

## 2024-05-23 DIAGNOSIS — G8929 Other chronic pain: Secondary | ICD-10-CM | POA: Diagnosis not present

## 2024-05-30 ENCOUNTER — Ambulatory Visit (INDEPENDENT_AMBULATORY_CARE_PROVIDER_SITE_OTHER): Admitting: Orthopedic Surgery

## 2024-05-30 DIAGNOSIS — M810 Age-related osteoporosis without current pathological fracture: Secondary | ICD-10-CM | POA: Diagnosis not present

## 2024-05-30 NOTE — Progress Notes (Signed)
 Orthopedic Spine Surgery Office Note   Assessment: Patient is a 89 y.o. female with acute onset of low back pain from an L2 acute on chronic L2 compression fracture     Plan: - Went over the CT scan with the patient and her family.  Showed the acute L2 compression fracture which I feel is consistent with her symptoms.  I explained that most compression fractures do heal without any surgical intervention.  She is doing better at this point so I think it is healing -No bending/lifting/twisting greater than 10 pounds -Can continue to use tramadol  as needed for pain relief.  Should use Tylenol  and lidocaine  patches as well -Patient has had multiple compression fractures and has had a hip fracture all which would be consistent with osteoporosis.  Referred her to our osteoporosis clinic -Patient should return to office in 6 weeks, x-rays at next visit: AP/lateral lumbar     Patient expressed understanding of the plan and all questions were answered to the patient's satisfaction.    ___________________________________________________________________________     History:   Patient is a 89 y.o. female who presents today for follow-up on her lumbar spine.  Patient's pain has gotten better since she was last seen in the office.  She is taking tramadol  only at night now.  She has been able to do more during the day as her pain has gotten better.  She has no pain radiating to either lower extremity.  She is pleased with how she is doing at this point.     Treatments tried: Tramadol , Norco     Physical Exam:   General: no acute distress, appears stated age Neurologic: alert, answering questions appropriately, following commands Respiratory: unlabored breathing on room air, symmetric chest rise Psychiatric: appropriate affect, normal cadence to speech     MSK (spine):   -Strength exam                                                   Left                  Right EHL                               5/5                  5/5 TA                                 5/5                  5/5 GSC                             5/5                  5/5 Knee extension            5/5                  5/5 Hip flexion                    5/5  5/5   -Sensory exam                           Sensation intact to light touch in L3-S1 nerve distributions of bilateral lower extremities    Imaging: XRs of the lumbar spine from 04/14/2024 were independently reviewed and interpreted, showing L4 compression fracture.  Compression fractures also seen at L2, L1, T12.  No spondylolisthesis seen.  No dislocation seen.  CT of the lumbar spine from 04/30/2024 was independent reviewed and interpreted, showing compression fractures that appear chronic at L4, L1, T12.  There is a acute fracture at L2 with anterior height loss.  No retropulsion seen in any of the fractures.  No dislocation seen.     Patient name: Michaela Simpson Patient MRN: [id number] Date of visit: 05/30/24

## 2024-06-05 ENCOUNTER — Encounter (HOSPITAL_BASED_OUTPATIENT_CLINIC_OR_DEPARTMENT_OTHER): Admitting: Family Medicine

## 2024-06-18 ENCOUNTER — Encounter: Payer: Self-pay | Admitting: Physician Assistant

## 2024-06-18 ENCOUNTER — Ambulatory Visit: Admitting: Physician Assistant

## 2024-06-18 VITALS — Ht 59.75 in | Wt 108.0 lb

## 2024-06-18 DIAGNOSIS — M810 Age-related osteoporosis without current pathological fracture: Secondary | ICD-10-CM | POA: Insufficient documentation

## 2024-06-18 NOTE — Progress Notes (Signed)
 "  Office Visit Note   Patient: Michaela Simpson           Date of Birth: Dec 18, 1931           MRN: [id number] Visit Date: 06/18/2024              Requested by: Georgina Ozell LABOR, MD [address] [place],  [place] [address] PCP: System, Provider Not In   Assessment & Plan: Visit Diagnoses:  1. Age-related osteoporosis without current pathological fracture     Plan: Patient is a pleasant 89 year old woman who lives at an assisted living.  She comes in with her daughter today.  She is self ambulating with a walker at the assisted living and tries to stay active.  She was referred by Dr. Georgina because she has had multiple fractures.  She has had hip replacements secondary to fractures she has had more recently compression fractures of multiple vertebral bodies.  She was a patient of Dr. Hughie and took some supplements in the past but has never really been on osteoporotic medication she does have a history of heart disease.  She has no history of cancer.  She has a history of decreased kidney function with last GFR being in the mid 30s.  She has no history of epilepsy or reflux.  She went through menopause at age 40 did not have any hormone replacement therapy.  Does not really take any calcium  or vitamin D .  No history of smoking no history of alcoholic beverages she does some walking at her assisted living facility.  No history of major dental issues.  She is with her history extreme risk of continuing fracturing.  We discussed options she would not be an option for Fosamax or Reclast because of her kidney disease.  She does not need an anabolic.  What would be best for her would be Jubbonti.  We discussed the side effects of this as well as risk factors.  She is hesitant to add this medication.  We also in-depth reviewed vitamin D  and calcium  requirements as well as some resistance training I have given these recommendations and the paperwork to her assisted living.  She does not have any recent  blood work but the daughter thinks she may have some at the facility.  We may have to update these at some point they will contact us  if they were interested in going forward spent 45 minutes reviewing her chart and speaking with her and her daughter  Follow-Up Instructions: No follow-ups on file.   Orders:  No orders of the defined types were placed in this encounter.  No orders of the defined types were placed in this encounter.     Procedures: No procedures performed   Clinical Data: No additional findings.   Subjective: No chief complaint on file.   HPI patient is a pleasant 89 year old woman who comes in today referral from Dr. Georgina for evaluation of osteoporosis  Review of Systems  All other systems reviewed and are negative.    Objective: Vital Signs: There were no vitals taken for this visit.  Physical Exam Constitutional:      Appearance: Normal appearance.  Pulmonary:     Effort: Pulmonary effort is normal.  Skin:    General: Skin is warm and dry.  Neurological:     General: No focal deficit present.     Mental Status: She is alert and oriented to person, place, and time.  Psychiatric:  Mood and Affect: Mood normal.        Behavior: Behavior normal.       Specialty Comments:  No specialty comments available.  Imaging: No results found.   PMFS History: Patient Active Problem List   Diagnosis Date Noted   Age-related osteoporosis without current pathological fracture 06/18/2024   Memory impairment 04/09/2024   Mild dementia 07/18/2023   Hematuria 06/02/2023   Vitamin B12 deficiency 06/02/2023   Aortic atherosclerosis 04/26/2023   Left radial fracture 04/26/2023   Hypomagnesemia 04/26/2023   PVD (peripheral vascular disease) 04/26/2023   C7 cervical fracture 04/26/2023   Thoracic ascending aortic aneurysm 04/26/2023   Recurrent falls 04/26/2023   C2 cervical fracture 04/20/2023   Cognitive changes 04/20/2023   Insomnia  03/03/2021   Traumatic arthritis of left hip 10/02/2019   Protein-calorie malnutrition, severe 09/21/2019   S/P TAVR (transcatheter aortic valve replacement) 08/08/2019   Severe aortic stenosis 07/19/2019   Essential hypertension, benign 03/15/2019   Major depressive disorder 03/15/2019   Chronic constipation 03/15/2019   Bilateral carpal tunnel syndrome 05/15/2018   Rheumatoid arthritis 03/14/2015   Past Medical History:  Diagnosis Date   Acute blood loss anemia 03/22/2015   AKI (acute kidney injury)    Anemia    Aortic atherosclerosis 04/26/2023   Bilateral carpal tunnel syndrome 05/15/2018   C2 cervical fracture 04/20/2023   C7 cervical fracture 04/26/2023   Chronic constipation 03/15/2019   Closed left humeral fracture 03/08/2019   Closed right hip fracture (HCC) 03/14/2015   Early cataract    Essential hypertension, benign 03/15/2019   Fracture of femoral neck, left, closed (HCC) 03/08/2019   Headache    Hematuria 06/02/2023   Humerus fracture    Hypomagnesemia 04/26/2023   Insomnia 03/03/2021   Left radial fracture 04/26/2023   Leukocytosis    Major depressive disorder 03/15/2019   Mild dementia 07/18/2023   Osteonecrosis    post-traumatic osteonecrosis of the femoral head of the left hip, retained orthopedic hardware ( order for conset)   Osteoporosis    Peripheral vascular disease    blood clot right leg   Protein-calorie malnutrition, severe 09/21/2019   PVD (peripheral vascular disease) 04/26/2023   Recurrent falls 04/26/2023   No definite cardiac or neurologic prodrome described.  Most recent falls have resulted in displaced fracture of C2 , nondisplaced fracture of C7 as well as a left wrist fracture for which brace was recommended.  SNF placement for rehab has been pursued.     Rheumatoid arthritis 03/14/2015   S/P TAVR (transcatheter aortic valve replacement) 08/08/2019   Sequelae of open wound of right lower extremity 03/15/2019   Severe aortic stenosis  07/19/2019   Thoracic ascending aortic aneurysm 04/26/2023   4.4 cm by 07/26/19 CTA   Traumatic arthritis of left hip 10/02/2019   Ulcer of lower extremity    Vitamin B12 deficiency 06/02/2023   Wears glasses     Family History  Problem Relation Age of Onset   Cancer Sister    Depression Other        multiple family members    Past Surgical History:  Procedure Laterality Date   ABDOMINAL AORTOGRAM W/LOWER EXTREMITY N/A 05/08/2019   Procedure: ABDOMINAL AORTOGRAM W/LOWER EXTREMITY;  Surgeon: Serene Gaile ORN, MD;  Location: [facility];  Service: Cardiovascular;  Laterality: N/A;   CATARACT EXTRACTION W/PHACO Left 10/24/2020   Procedure: CATARACT EXTRACTION PHACO AND INTRAOCULAR LENS PLACEMENT LEFT EYE;  Surgeon: Harrie Agent, MD;  Location: [facility];  Service: Ophthalmology;  Laterality: Left;  CDE  20.05   CATARACT EXTRACTION W/PHACO Right 11/07/2020   Procedure: CATARACT EXTRACTION PHACO AND INTRAOCULAR LENS PLACEMENT RIGHT EYE;  Surgeon: Harrie Agent, MD;  Location: [facility];  Service: Ophthalmology;  Laterality: Right;  right, CDE=24.26   HARDWARE REMOVAL Left 10/02/2019   Procedure: HARDWARE REMOVAL;  Surgeon: Vernetta Lonni GRADE, MD;  Location: [facility] OR;  Service: Orthopedics;  Laterality: Left;   HIP PINNING,CANNULATED Left 03/09/2019   Procedure: CANNULATED HIP PINNING;  Surgeon: Vernetta Lonni GRADE, MD;  Location: [facility] OR;  Service: Orthopedics;  Laterality: Left;   INTRAMEDULLARY (IM) NAIL INTERTROCHANTERIC Right 03/14/2015   Procedure: RIGHT INTERTROCHANTRIC INTRAMEDULLARY (IM) NAIL ;  Surgeon: Lonni GRADE Vernetta, MD;  Location: [facility] OR;  Service: Orthopedics;  Laterality: Right;   PERIPHERAL VASCULAR INTERVENTION Right 05/08/2019   Procedure: PERIPHERAL VASCULAR INTERVENTION;  Surgeon: Serene Gaile ORN, MD;  Location: [facility];  Service: Cardiovascular;  Laterality: Right;   RIGHT/LEFT HEART CATH AND CORONARY ANGIOGRAPHY N/A 07/19/2019   Procedure: RIGHT/LEFT  HEART CATH AND CORONARY ANGIOGRAPHY;  Surgeon: Wonda Sharper, MD;  Location: [facility] INVASIVE CV LAB;  Service: Cardiovascular;  Laterality: N/A;   TEE WITHOUT CARDIOVERSION N/A 08/07/2019   Procedure: TRANSESOPHAGEAL ECHOCARDIOGRAM (TEE);  Surgeon: Wonda Sharper, MD;  Location: [facility] INVASIVE CV LAB;  Service: Open Heart Surgery;  Laterality: N/A;   TOTAL HIP ARTHROPLASTY Left 10/02/2019   Procedure: LEFT TOTAL HIP ARTHROPLASTY ANTERIOR APPROACH AND REMOVAL OF CANNULATED SCREWS OF LEFT HIP;  Surgeon: Vernetta Lonni GRADE, MD;  Location: [facility] OR;  Service: Orthopedics;  Laterality: Left;   TRANSCATHETER AORTIC VALVE REPLACEMENT, TRANSFEMORAL  08/07/2019   TRANSCATHETER AORTIC VALVE REPLACEMENT, TRANSFEMORAL N/A 08/07/2019   Procedure: TRANSCATHETER AORTIC VALVE REPLACEMENT, TRANSFEMORAL;  Surgeon: Wonda Sharper, MD;  Location: [facility] INVASIVE CV LAB;  Service: Open Heart Surgery;  Laterality: N/A;   Social History   Occupational History   Occupation: retired  Tobacco Use   Smoking status: Never    Passive exposure: Never   Smokeless tobacco: Never  Vaping Use   Vaping status: Never Used  Substance and Sexual Activity   Alcohol use: No   Drug use: No   Sexual activity: Not on file        "

## 2024-07-02 ENCOUNTER — Ambulatory Visit
Admission: RE | Admit: 2024-07-02 | Discharge: 2024-07-02 | Disposition: A | Source: Ambulatory Visit | Attending: Physician Assistant | Admitting: Physician Assistant

## 2024-07-02 DIAGNOSIS — D329 Benign neoplasm of meninges, unspecified: Secondary | ICD-10-CM

## 2024-07-02 DIAGNOSIS — R413 Other amnesia: Secondary | ICD-10-CM

## 2024-07-09 ENCOUNTER — Ambulatory Visit: Payer: Self-pay | Admitting: Physician Assistant

## 2024-07-19 ENCOUNTER — Ambulatory Visit: Admitting: Orthopedic Surgery

## 2024-07-19 ENCOUNTER — Other Ambulatory Visit (INDEPENDENT_AMBULATORY_CARE_PROVIDER_SITE_OTHER): Payer: Self-pay

## 2024-07-19 DIAGNOSIS — M545 Low back pain, unspecified: Secondary | ICD-10-CM | POA: Diagnosis not present

## 2024-07-19 NOTE — Progress Notes (Signed)
 Orthopedic Spine Surgery Office Note   Assessment: Patient is a 89 y.o. female with acute onset of low back pain from an L2 acute on chronic L2 compression fracture. Not having any pain anymore     Plan: -She is no longer having any pain in her back so I feel that she has successfully healed her fracture. No operative intervention recommended -Activity as tolerated -Patient should return to office on an as needed basis     Patient expressed understanding of the plan and all questions were answered to the patient's satisfaction.    ___________________________________________________________________________     History:   Patient is a 89 y.o. female who presents today for follow-up on her lumbar spine.  Patient has been doing well since she was last in the office. She was initially taking some tramadol  to control the pain. She was then weaned to tylenol . She is no longer having any back pain. She is not taking any medications for pain.      Treatments tried: Tramadol , Norco, tylenol      Physical Exam:   General: no acute distress, appears stated age Neurologic: alert, answering questions appropriately, following commands Respiratory: unlabored breathing on room air, symmetric chest rise Psychiatric: appropriate affect, normal cadence to speech     MSK (spine):   -Strength exam                                                   Left                  Right EHL                              5/5                  5/5 TA                                 5/5                  5/5 GSC                             5/5                  5/5 Knee extension            5/5                  5/5 Hip flexion                    5/5                  5/5   -Sensory exam                           Sensation intact to light touch in L3-S1 nerve distributions of bilateral lower extremities     Imaging: XRs of the lumbar spine from 07/19/2024 were independently reviewed and interpreted, showing  multiple compression fractures. The L2 compression fracture appears of similar height to prior films on 04/14/2024.  No new fracture seen. No spondylolisthesis  seen. No dislocation seen.    CT of the lumbar spine from 04/30/2024 was previously independent reviewed and interpreted, showing compression fractures that appear chronic at L4, L1, T12.  There is a acute fracture at L2 with anterior height loss.  No retropulsion seen in any of the fractures.  No dislocation seen.     Patient name: Michaela Simpson Patient MRN: [id number] Date of visit: 07/19/24

## 2024-08-28 ENCOUNTER — Institutional Professional Consult (permissible substitution): Admitting: Psychology

## 2024-08-28 ENCOUNTER — Ambulatory Visit: Payer: Self-pay

## 2024-09-04 ENCOUNTER — Encounter: Payer: Self-pay | Admitting: Psychology

## 2024-10-08 ENCOUNTER — Ambulatory Visit: Payer: Self-pay | Admitting: Physician Assistant
